# Patient Record
Sex: Male | Born: 1935 | Race: White | Hispanic: No | Smoking: Former smoker
Health system: Southern US, Community
[De-identification: ages and names within clinical notes are randomized; demographics above are authoritative.]

## PROBLEM LIST (undated history)

## (undated) DIAGNOSIS — T7840XA Allergy, unspecified, initial encounter: Secondary | ICD-10-CM

## (undated) DIAGNOSIS — J9610 Chronic respiratory failure, unspecified whether with hypoxia or hypercapnia: Secondary | ICD-10-CM

## (undated) DIAGNOSIS — N183 Chronic kidney disease, stage 3 (moderate): Secondary | ICD-10-CM

## (undated) DIAGNOSIS — E785 Hyperlipidemia, unspecified: Secondary | ICD-10-CM

## (undated) DIAGNOSIS — R001 Bradycardia, unspecified: Secondary | ICD-10-CM

## (undated) DIAGNOSIS — C159 Malignant neoplasm of esophagus, unspecified: Secondary | ICD-10-CM

## (undated) DIAGNOSIS — Z9289 Personal history of other medical treatment: Secondary | ICD-10-CM

## (undated) DIAGNOSIS — A498 Other bacterial infections of unspecified site: Secondary | ICD-10-CM

## (undated) DIAGNOSIS — I639 Cerebral infarction, unspecified: Secondary | ICD-10-CM

## (undated) DIAGNOSIS — I10 Essential (primary) hypertension: Secondary | ICD-10-CM

## (undated) DIAGNOSIS — T884XXA Failed or difficult intubation, initial encounter: Secondary | ICD-10-CM

## (undated) DIAGNOSIS — I5032 Chronic diastolic (congestive) heart failure: Secondary | ICD-10-CM

## (undated) DIAGNOSIS — K922 Gastrointestinal hemorrhage, unspecified: Secondary | ICD-10-CM

## (undated) DIAGNOSIS — G629 Polyneuropathy, unspecified: Secondary | ICD-10-CM

## (undated) DIAGNOSIS — K219 Gastro-esophageal reflux disease without esophagitis: Secondary | ICD-10-CM

## (undated) DIAGNOSIS — I2510 Atherosclerotic heart disease of native coronary artery without angina pectoris: Secondary | ICD-10-CM

## (undated) DIAGNOSIS — Z9981 Dependence on supplemental oxygen: Secondary | ICD-10-CM

## (undated) DIAGNOSIS — Z1629 Resistance to other single specified antibiotic: Secondary | ICD-10-CM

## (undated) DIAGNOSIS — M549 Dorsalgia, unspecified: Secondary | ICD-10-CM

## (undated) DIAGNOSIS — I4819 Other persistent atrial fibrillation: Secondary | ICD-10-CM

## (undated) HISTORY — DX: Gastro-esophageal reflux disease without esophagitis: K21.9

## (undated) HISTORY — DX: Allergy, unspecified, initial encounter: T78.40XA

## (undated) HISTORY — DX: Polyneuropathy, unspecified: G62.9

## (undated) HISTORY — PX: TONSILLECTOMY: SUR1361

## (undated) HISTORY — DX: Chronic diastolic (congestive) heart failure: I50.32

## (undated) HISTORY — DX: Chronic kidney disease, stage 3 (moderate): N18.3

## (undated) HISTORY — DX: Personal history of other medical treatment: Z92.89

## (undated) HISTORY — DX: Hyperlipidemia, unspecified: E78.5

## (undated) HISTORY — DX: Gastrointestinal hemorrhage, unspecified: K92.2

## (undated) HISTORY — DX: Other persistent atrial fibrillation: I48.19

## (undated) HISTORY — PX: COLONOSCOPY: SHX174

## (undated) HISTORY — DX: Essential (primary) hypertension: I10

## (undated) HISTORY — DX: Dorsalgia, unspecified: M54.9

---

## 1986-01-09 HISTORY — PX: CORONARY ANGIOPLASTY WITH STENT PLACEMENT: SHX49

## 2000-09-05 ENCOUNTER — Encounter (INDEPENDENT_AMBULATORY_CARE_PROVIDER_SITE_OTHER): Payer: Self-pay | Admitting: *Deleted

## 2000-09-05 ENCOUNTER — Other Ambulatory Visit: Admission: RE | Admit: 2000-09-05 | Discharge: 2000-09-05 | Payer: Self-pay | Admitting: Gastroenterology

## 2002-01-14 ENCOUNTER — Inpatient Hospital Stay (HOSPITAL_COMMUNITY): Admission: EM | Admit: 2002-01-14 | Discharge: 2002-01-17 | Payer: Self-pay | Admitting: Emergency Medicine

## 2002-01-15 ENCOUNTER — Encounter: Payer: Self-pay | Admitting: Cardiology

## 2002-01-15 ENCOUNTER — Encounter: Payer: Self-pay | Admitting: Internal Medicine

## 2002-01-17 ENCOUNTER — Encounter: Payer: Self-pay | Admitting: Internal Medicine

## 2003-04-10 ENCOUNTER — Encounter: Admission: RE | Admit: 2003-04-10 | Discharge: 2003-04-10 | Payer: Self-pay | Admitting: Internal Medicine

## 2006-01-09 HISTORY — PX: CATARACT EXTRACTION W/ INTRAOCULAR LENS  IMPLANT, BILATERAL: SHX1307

## 2008-10-26 ENCOUNTER — Ambulatory Visit: Payer: Self-pay | Admitting: Radiology

## 2008-10-26 ENCOUNTER — Emergency Department (HOSPITAL_BASED_OUTPATIENT_CLINIC_OR_DEPARTMENT_OTHER): Admission: EM | Admit: 2008-10-26 | Discharge: 2008-10-26 | Payer: Self-pay | Admitting: Emergency Medicine

## 2010-04-14 LAB — POCT CARDIAC MARKERS: Troponin i, poc: <0.05 ng/mL (ref 0.00–0.09)

## 2010-04-14 LAB — COMPREHENSIVE METABOLIC PANEL
ALT: 24 U/L (ref 0–53)
AST: 41 U/L — ABNORMAL HIGH (ref 0–37)
Albumin: 4.2 g/dL (ref 3.5–5.2)
Calcium: 9.8 mg/dL (ref 8.4–10.5)
GFR calc Af Amer: 48 mL/min — ABNORMAL LOW (ref >60)
Potassium: 4.5 mEq/L (ref 3.5–5.1)
Sodium: 142 mEq/L (ref 135–145)
Total Protein: 7.4 g/dL (ref 6.0–8.3)

## 2010-04-14 LAB — URINALYSIS, ROUTINE W REFLEX MICROSCOPIC
Hgb urine dipstick: NEGATIVE
Nitrite: NEGATIVE
Specific Gravity, Urine: 1.021 (ref 1.005–1.030)
Urobilinogen, UA: 0.2 mg/dL (ref 0.0–1.0)
pH: 6 (ref 5.0–8.0)

## 2010-04-14 LAB — CBC
MCHC: 35.1 g/dL (ref 30.0–36.0)
Platelets: 302 10*3/uL (ref 150–400)
RDW: 12.9 % (ref 11.5–15.5)

## 2010-04-14 LAB — DIFFERENTIAL
Eosinophils Absolute: 0.6 10*3/uL (ref 0.0–0.7)
Eosinophils Relative: 6 % — ABNORMAL HIGH (ref 0–5)
Lymphs Abs: 1.6 10*3/uL (ref 0.7–4.0)
Monocytes Absolute: 1 10*3/uL (ref 0.1–1.0)
Monocytes Relative: 10 % (ref 3–12)
Neutrophils Relative %: 68 % (ref 43–77)

## 2010-04-14 LAB — POCT B-TYPE NATRIURETIC PEPTIDE (BNP): B Natriuretic Peptide, POC: 24.8 pg/mL (ref 0–100)

## 2010-04-14 LAB — PROTIME-INR: Prothrombin Time: 13.1 seconds (ref 11.6–15.2)

## 2010-05-27 NOTE — Consult Note (Signed)
NAME:  Mark Rivers, Mark Rivers NO.:  [id number]   MEDICAL RECORD NO.:  [id number]                   PATIENT TYPE:  INP   LOCATION:  3739                                 FACILITY:  [facility]   PHYSICIAN:  Jonelle Sidle, M.D. [facility]        DATE OF BIRTH:  1934-02-14   DATE OF CONSULTATION:  01/15/2002  DATE OF DISCHARGE:                                   CONSULTATION   PRIMARY CARE PHYSICIAN:  The patient is followed by [facility] in  [place].   REASON FOR CONSULTATION:  Abnormal cardiac enzymes.   HISTORY OF PRESENT ILLNESS:  The patient is a pleasant 76 year old male with  a history of hypertension, dyslipidemia, and apparent coronary artery  disease, status post stent placement to a single vessel approximately 7-8  years ago, while living in [place].  This was not in the setting of  myocardial infarction and followed an abnormal stress test, given symptoms  of dyspnea, exertion, and chest discomfort.  He is now admitted following an  episode of syncope.  The patient states that he was having some focal tooth  discomfort and took an old amoxicillin tablet that he had had for over a  year, which he had used in the past for apparent tooth infection.  When  using amoxicillin previously, he did notice some itching and generalized  rash.  Approximately 30 minutes after ingesting this pill with his other  medicines, he described an itchy sensation all over followed by erythema,  progressing from head to toe and subsequent clamminess and diaphoresis.  He  sought the help of a neighbor and ultimately apparently had frank syncopal  event of unknown duration.  He awoke from this event on the floor and sat  back on the bed, waiting for EMS to arrive.  When he was evaluated in the  emergency department, he was treated with epinephrine and Benadryl for a  presumptive anaphylactic like reaction.  At present he feels back to  baseline.  He did note significant  dyspnea at the time of the event.  As  part of his evaluation in house, he had cardiac enzymes obtained, which  revealed troponin I levels of 0.04 up to 0.10, up to 0.18 with overall  normal relative indices on assessment of CK and MB levels.  He denies any  recent chest discomfort, specifically stating that he walks his dogs a few  times a day without any difficulty.  He does housework and yard work which  includes heavy lifting without difficulty and has no typical dyspnea  exertion without any PND, palpitations, or usual syncope.  He apparently was  evaluated by one of the [place] cardiologists which he cannot seem to  remember at this time about four months ago (potentially Dr. Eden Emms).  He  reports at that time having an echocardiogram and a Cardiolite, which by his  account were normal.  We were asked to evaluate further.  ALLERGIES:  PENICILLIN.   MEDICATIONS PRIOR TO ADMISSION:  1. Zocor 40 mg p.o. q.h.s.  2. Tricor 160 mg p.o. daily.  3. Cardizem CD 120 mg p.o. daily.  4. Lisinopril 20 mg p.o. t.i.d.  5. Calcium supplements.  6. Garlic.   CURRENT MEDICATIONS:  Lovenox 40 mg subcu daily.   PAST MEDICAL HISTORY:  1. Hypertension.  2. Dyslipidemia.  3. Apparent history of coronary artery disease without known myocardial     infarction, status post stent placement to a single vessel 7-8 years ago     while living in [place].  4. Reportedly recent echocardiogram and Cardiolite at the [facility] office were     negative about four months ago.  Records are pending.   SOCIAL HISTORY:  The patient is a retired Recruitment consultant.  He lives in  [place] with his girlfriend.  He has a remote history of tobacco and  alcohol use.  He has four children.   FAMILY HISTORY:  Noncontributory at present.   REVIEW OF SYSTEMS:  As described in history of present illness, otherwise  negative.   PHYSICAL EXAMINATION:  VITAL SIGNS:  Temperature is 97.1 degrees, blood  pressure is 142/72,  heart rate is 68 and regular, respirations 20, oxygen  saturation 94% on room air.  GENERAL:  This is an obese male, seems in no acute distress.  HEENT:  Conjunctivae and nose are normal.  Oropharynx is clear.  NECK:  Supple without elevated jugulovenous pressure or carotid bruits.  No  thyromegaly is noted.  LUNGS:  Clear to auscultation bilaterally with normal  __________  at rest.  CARDIAC:  Reveals a regular rate and rhythm without gallop or murmur.  ABDOMEN:  Has no obvious organomegaly or bruits.  EXTREMITIES:  Exhibit no clubbing, cyanosis, or edema.  Peripheral pulses  are 2+.   LABORATORY DATA:  A 12 lead electrocardiogram shows normal sinus rhythm with  decreased anterior R-wave progression without frank loss of R-waves,  otherwise nonspecific Q-wave changes are noted.   CT scan of the head is interpreted as normal.   Chest x-ray reported shows mild basilar linear atelectasis.  No clear  infiltrate.   Carotid Dopplers were obtained, revealing no significant flow limiting  internal carotid artery stenoses with antegrade vertebral flow.   Telemetry has been without significant arrhythmia.   IMPRESSION:  1. Recent episode suggestive potentially of an anaphylactic or anaphylactoid     reaction to amoxicillin, including significant pruritus, erythema,     dizziness with subsequent syncope, dyspnea, and clamminess.  The patient     has had no recent chest discomfort or dyspnea on exertion and maintains a     relatively normal functional status based on his description.  Cardiac     enzymes are mildly abnormal as outlined.  A 12 lead electrocardiogram is     nonspecific.  The patient apparently had a fairly recent ischemic workup     about four months ago that was reassuring by report.  Records are pending     currently.  He may have seen Dr. Eden Emms at this time, although this is     not clear. 2. Known history of coronary artery disease, status post stent placement to     a  single vessel, approximately 7-8 years ago in [place] without     history of myocardial infarction and presumably normal left ventricular     contraction.  3. Dyslipidemia.  4. Hypertension.    RECOMMENDATIONS:  1.  Will obtain records of Cardiolite and echocardiogram performed in the     [facility] office about four months ago and any particular clinic visit     notes from that time.  The patient may have seen Dr. Eden Emms at that time     as best he can recall.  2. Minor troponin I elevations are of uncertain significance at this time.     Certainly the possible anaphylactic/anaphylactoid event could have due to     the significant amount of stress associated with it, led to ischemia in     the setting of known coronary artery disease leading to minor troponin     leak.  The patient does not have classic exertional angina or dyspnea on     exertion at this time to suggest a major flow limiting stenosis at     baseline.  Certainly one could proceed with coronary angiography for a     definitive diagnosis as a first step.  He has had a 2-D echocardiogram     today, results are currently pending.  If this study is abnormal, would     proceed with coronary angiography as a first step.  Could otherwise     actually consider repeat Cardiolite and if reassuring, continue medical     therapy, particularly as the patient has had no typical anginal or     dyspnea on exertion symptoms recently.  Will defer to the patient's     primary cardiologist.  3. Otherwise continue current medications and telemetry.                                                  Jonelle Sidle, M.D. [facility]    SGM/MEDQ  D:  01/15/2002  T:  01/15/2002  Job:  [id number]

## 2010-05-27 NOTE — Discharge Summary (Signed)
NAME:  Mark Rivers, Mark Rivers                       ACCOUNT NO.:  [id number]   MEDICAL RECORD NO.:  [id number]                   PATIENT TYPE:  INP   LOCATION:  3739                                 FACILITY:  [facility]   PHYSICIAN:  Titus Dubin. Alwyn Ren, M.D. [facility]         DATE OF BIRTH:  04-Nov-1934   DATE OF ADMISSION:  01/14/2002  DATE OF DISCHARGE:  01/17/2002                                 DISCHARGE SUMMARY   ADMITTING IMPRESSION:  1. Possible anaphylactoid reaction to amoxicillin with pruritus, erythema,     dizziness, subsequent syncope, dyspnea, and diaphoresis.  2. Coronary artery disease, status post stent placement to single vessel     approximately eight years ago in [place].  3. Dyslipidemia.  4. Hypertension.   DISCHARGE DIAGNOSES:  1. Possible anaphylactoid reaction to amoxicillin with pruritus, erythema,     dizziness, subsequent syncope, dyspnea, and diaphoresis.  2. Coronary artery disease, status post stent placement to single vessel     approximately eight years ago in [place].  3. Dyslipidemia.  4. Hypertension.  5. New diagnosis of adult onset diabetes mellitus.   HISTORY OF PRESENT ILLNESS:  Please see the dictated history.  The patient  said that he was having some local tooth discomfort.  He had taken old  amoxicillin that was outdated.  Previously he had noted some itching and  generalized rash with amoxicillin.  Thirty minutes after ingesting the pill,  he noted pruritus followed by erythema progressing from the head to toes  with diaphoresis.  Apparently he had frank syncope of unknown duration.  He  received epinephrine and Benadryl for presumptive anaphylaxis.  The acute  event was associated with acute dyspnea.   HOSPITAL COURSE:  He was admitted and monitored.  He was seen in  consultation by Jonelle Sidle, M.D., of [facility].   His troponin was 0.18 with normal less than 0.04.  The white count was  elevated at 15,900.  The eosinophil  count was 0.  The CPK was initially 185  with a 4.6 MB.  The initial troponin was 0.04.  The subsequent CPK was 163  with an MB of 5.8.  On January 15, 2002, his total CPK was 208 with an MB of  6.2.  On January 15, 2002, a troponin was 0.04.   The electrolytes were normal, except for a BUN of 37 and a glucose of 223.  The hemoglobin A1C was 7.3, indicating adult onset diabetes mellitus.  Lipids revealed a total cholesterol of 174 with triglycerides of 152 and an  HDL of 46.  He stated that his abdominal measurement was less than 40,  although clinically it appeared larger, and direct measurement was not made.   Radiographic studies included a negative noncontrast CT of the head.  The CT  scan showed no pulmonary embolic disease.  The chest x-ray showed mild  cardiomegaly with minimal basilar atelectasis.  The EKG suggested possible  anterior infarct of possible age manifested by poor R-wave progression of V1-  V3.   He underwent an adenosine stress test on January 17, 2002.  It was negative  clinically without significant ST-T changes from baseline.  He did have  occasional PVCs.  The images revealed scar, but no ischemic process.   DISCHARGE MEDICATIONS:  He was discharged on his home medicines of:  1. Zocor 40 mg at bedtime.  2. TriCor 160 mg each morning.  3. Cardizem CD 120 mg daily.  4. Lisinopril 60 mg daily.  5. Calcium supplements prior to admission.  These were discontinued.  It was     also recommended that he take Amaryl 2 mg one-half pill each morning and     Avandia 8 mg one-half pill with the largest meal.   SPECIAL INSTRUCTIONS:  He was to receive a Glucommander and be instructed in  monitoring glucose.  He was asked to check his fasting blood sugar on  Monday, Wednesday, Friday, and Sunday and have a goal of less than 120 on  average.  He was to check his two-hour postprandial glucose on Tuesday,  Thursday, and Saturday; the goal will be an average of less than 180.   He  was asked to measure his waist at the top of the hips across the umbilicus;  the goal will be a measurement less than 40 inches.   FOLLOW-UP:  He was to be seen in follow-up by me or Wanda Plump, M.D., at  [facility] in the next two weeks.  He was to see Charlton Haws, M.D.,  in four to six weeks and was asked to call [phone] for an appointment.   DISCHARGE STATUS:  Improved.   PROGNOSIS:  Good if there is control of the metabolic syndrome risk factors.                                               Titus Dubin. Alwyn Ren, M.D. [facility]    [facility]H/MEDQ  D:  01/17/2002  T:  01/18/2002  Job:  [id number]   cc:   Charlton Haws, M.D. [facility]

## 2010-08-26 ENCOUNTER — Encounter: Payer: Self-pay | Admitting: Gastroenterology

## 2011-07-11 ENCOUNTER — Encounter: Payer: Self-pay | Admitting: Gastroenterology

## 2011-09-05 ENCOUNTER — Ambulatory Visit (AMBULATORY_SURGERY_CENTER): Payer: Medicare Other

## 2011-09-05 VITALS — Ht 70.0 in | Wt 227.1 lb

## 2011-09-05 DIAGNOSIS — Z1211 Encounter for screening for malignant neoplasm of colon: Secondary | ICD-10-CM

## 2011-09-05 MED ORDER — MOVIPREP 100 G PO SOLR: ORAL | Status: DC

## 2011-09-12 ENCOUNTER — Encounter: Payer: Self-pay | Admitting: Gastroenterology

## 2011-09-12 ENCOUNTER — Ambulatory Visit (AMBULATORY_SURGERY_CENTER): Payer: Medicare Other | Admitting: Gastroenterology

## 2011-09-12 VITALS — BP 134/59 | HR 61 | Temp 98.6°F | Resp 16 | Ht 70.0 in | Wt 227.0 lb

## 2011-09-12 DIAGNOSIS — Z1211 Encounter for screening for malignant neoplasm of colon: Secondary | ICD-10-CM

## 2011-09-12 LAB — GLUCOSE, CAPILLARY: Glucose-Capillary: 132 mg/dL — ABNORMAL HIGH (ref 70–99)

## 2011-09-12 MED ORDER — SODIUM CHLORIDE 0.9 % IV SOLN: 500.0000 mL | INTRAVENOUS | Status: DC

## 2011-09-12 NOTE — Progress Notes (Signed)
Patient did not experience any of the following events: a burn prior to discharge; a fall within the facility; wrong site/side/patient/procedure/implant event; or a hospital transfer or hospital admission upon discharge from the facility. (G8907) Patient did not have preoperative order for IV antibiotic SSI prophylaxis. (G8918)  

## 2011-09-12 NOTE — Patient Instructions (Signed)
YOU HAD AN ENDOSCOPIC PROCEDURE TODAY AT THE [facility]: Refer to the procedure report that was given to you for any specific questions about what was found during the examination.  If the procedure report does not answer your questions, please call your gastroenterologist to clarify.  If you requested that your care partner not be given the details of your procedure findings, then the procedure report has been included in a sealed envelope for you to review at your convenience later.  YOU SHOULD EXPECT: Some feelings of bloating in the abdomen. Passage of more gas than usual.  Walking can help get rid of the air that was put into your GI tract during the procedure and reduce the bloating. If you had a lower endoscopy (such as a colonoscopy or flexible sigmoidoscopy) you may notice spotting of blood in your stool or on the toilet paper. If you underwent a bowel prep for your procedure, then you may not have a normal bowel movement for a few days.  DIET: Your first meal following the procedure should be a light meal and then it is ok to progress to your normal diet.  A half-sandwich or bowl of soup is an example of a good first meal.  Heavy or fried foods are harder to digest and may make you feel nauseous or bloated.  Likewise meals heavy in dairy and vegetables can cause extra gas to form and this can also increase the bloating.  Drink plenty of fluids but you should avoid alcoholic beverages for 24 hours.  ACTIVITY: Your care partner should take you home directly after the procedure.  You should plan to take it easy, moving slowly for the rest of the day.  You can resume normal activity the day after the procedure however you should NOT DRIVE or use heavy machinery for 24 hours (because of the sedation medicines used during the test).    SYMPTOMS TO REPORT IMMEDIATELY: A gastroenterologist can be reached at any hour.  During normal business hours, 8:30 AM to 5:00 PM Monday through Friday,  call [phone].  After hours and on weekends, please call the GI answering service at [phone] who will take a message and have the physician on call contact you.   Following lower endoscopy (colonoscopy or flexible sigmoidoscopy):  Excessive amounts of blood in the stool  Significant tenderness or worsening of abdominal pains  Swelling of the abdomen that is new, acute  Fever of 100F or higher    FOLLOW UP: If any biopsies were taken you will be contacted by phone or by letter within the next 1-3 weeks.  Call your gastroenterologist if you have not heard about the biopsies in 3 weeks.  Our staff will call the home number listed on your records the next business day following your procedure to check on you and address any questions or concerns that you may have at that time regarding the information given to you following your procedure. This is a courtesy call and so if there is no answer at the home number and we have not heard from you through the emergency physician on call, we will assume that you have returned to your regular daily activities without incident.  SIGNATURES/CONFIDENTIALITY: You and/or your care partner have signed paperwork which will be entered into your electronic medical record.  These signatures attest to the fact that that the information above on your After Visit Summary has been reviewed and is understood.  Full responsibility of the confidentiality   of this discharge information lies with you and/or your care-partner.    INFORMATION ON DIVERTICULOSIS AND HIGH FIBER DIET & HEMORRHOIDS  GIVEN TO YOU TODAY

## 2011-09-12 NOTE — Op Note (Signed)
[facility] [address] [place], [address]   COLONOSCOPY PROCEDURE REPORT  PATIENT: Mark Rivers, Mark Rivers  MR#: [id number] BIRTHDATE: [birth year] , 77  yrs. old GENDER: Male ENDOSCOPIST: Meryl Dare, MD, [facility]  PROCEDURE DATE:  09/12/2011 PROCEDURE:   Colonoscopy, screening ASA CLASS:   Class II INDICATIONS: routine risk screening MEDICATIONS: propofol (Diprivan) 120mg  IV DESCRIPTION OF PROCEDURE:   After the risks benefits and alternatives of the procedure were thoroughly explained, informed consent was obtained.  A digital rectal exam revealed no abnormalities of the rectum.   The LB CF-[id number]  endoscope was introduced through the anus and advanced to the cecum, which was identified by both the appendix and ileocecal valve. No adverse events experienced.   The quality of the prep was good, using MoviPrep  The instrument was then slowly withdrawn as the colon was fully examined.    COLON FINDINGS: Mild diverticulosis was noted in the sigmoid colon. The colon was otherwise normal.  There was no diverticulosis, inflamation, polyps or cancers unless previously stated. Retroflexed views revealed small internal hemorrhoids. The time to cecum=3 minutes 40 seconds.  Withdrawal time=9 minutes 02 seconds. The scope was withdrawn and the procedure completed. COMPLICATIONS: There were no complications.  ENDOSCOPIC IMPRESSION: 1.   Mild diverticulosis was noted in the sigmoid colon 2.   Small internal hemorrhoids  RECOMMENDATIONS: 1.  High fiber diet with liberal fluid intake. 2.  Given your age, you will not need another colonoscopy for colon cancer screening or polyp surveillance.  These types of tests usually stop around the age 85.   eSigned:  Meryl Dare, MD, [facility] 09/12/2011 10:34 AM   cc: Bernadette Hoit, MD

## 2011-09-13 ENCOUNTER — Telehealth: Payer: Self-pay | Admitting: *Deleted

## 2011-09-13 NOTE — Telephone Encounter (Signed)
  Follow up Call-  Call back number 09/12/2011  Post procedure Call Back phone  # [phone]  Permission to leave phone message Yes     Patient questions:  Do you have a fever, pain , or abdominal swelling? no Pain Score  0 *  Have you tolerated food without any problems? yes  Have you been able to return to your normal activities? yes  Do you have any questions about your discharge instructions: Diet   no Medications  no Follow up visit  no  Do you have questions or concerns about your Care? no  Actions: * If pain score is 4 or above: No action needed, pain <4.

## 2012-07-11 ENCOUNTER — Encounter: Payer: Medicare Other | Admitting: Cardiovascular Disease

## 2012-08-08 ENCOUNTER — Encounter: Payer: Self-pay | Admitting: Cardiovascular Disease

## 2012-08-22 ENCOUNTER — Other Ambulatory Visit: Payer: Self-pay | Admitting: Family Medicine

## 2012-08-22 DIAGNOSIS — M549 Dorsalgia, unspecified: Secondary | ICD-10-CM

## 2012-08-28 ENCOUNTER — Other Ambulatory Visit: Payer: Medicare Other

## 2013-02-21 ENCOUNTER — Encounter: Payer: Self-pay | Admitting: Cardiovascular Disease

## 2013-02-21 DIAGNOSIS — M549 Dorsalgia, unspecified: Secondary | ICD-10-CM | POA: Insufficient documentation

## 2013-02-21 DIAGNOSIS — K219 Gastro-esophageal reflux disease without esophagitis: Secondary | ICD-10-CM | POA: Insufficient documentation

## 2013-02-21 DIAGNOSIS — I129 Hypertensive chronic kidney disease with stage 1 through stage 4 chronic kidney disease, or unspecified chronic kidney disease: Secondary | ICD-10-CM | POA: Insufficient documentation

## 2013-02-21 DIAGNOSIS — T7840XA Allergy, unspecified, initial encounter: Secondary | ICD-10-CM | POA: Insufficient documentation

## 2013-02-21 DIAGNOSIS — G629 Polyneuropathy, unspecified: Secondary | ICD-10-CM | POA: Insufficient documentation

## 2013-02-21 DIAGNOSIS — N183 Chronic kidney disease, stage 3 (moderate): Secondary | ICD-10-CM

## 2013-02-25 ENCOUNTER — Encounter: Payer: Medicare Other | Admitting: Cardiovascular Disease

## 2013-03-12 ENCOUNTER — Ambulatory Visit (INDEPENDENT_AMBULATORY_CARE_PROVIDER_SITE_OTHER): Payer: Medicare Other | Admitting: Cardiovascular Disease

## 2013-03-12 ENCOUNTER — Encounter: Payer: Self-pay | Admitting: Cardiovascular Disease

## 2013-03-12 VITALS — BP 184/64 | HR 44 | Ht 68.0 in | Wt 229.0 lb

## 2013-03-12 DIAGNOSIS — R0609 Other forms of dyspnea: Secondary | ICD-10-CM

## 2013-03-12 DIAGNOSIS — R0600 Dyspnea, unspecified: Secondary | ICD-10-CM

## 2013-03-12 DIAGNOSIS — R0989 Other specified symptoms and signs involving the circulatory and respiratory systems: Secondary | ICD-10-CM

## 2013-03-12 DIAGNOSIS — I10 Essential (primary) hypertension: Secondary | ICD-10-CM

## 2013-03-12 DIAGNOSIS — I2510 Atherosclerotic heart disease of native coronary artery without angina pectoris: Secondary | ICD-10-CM

## 2013-03-12 DIAGNOSIS — M549 Dorsalgia, unspecified: Secondary | ICD-10-CM

## 2013-03-12 DIAGNOSIS — Z77090 Contact with and (suspected) exposure to asbestos: Secondary | ICD-10-CM

## 2013-03-12 DIAGNOSIS — R0602 Shortness of breath: Secondary | ICD-10-CM | POA: Insufficient documentation

## 2013-03-12 MED ORDER — HYDROCHLOROTHIAZIDE 12.5 MG PO CAPS: 12.5000 mg | ORAL_CAPSULE | Freq: Every day | ORAL | Status: DC

## 2013-03-12 MED ORDER — LISINOPRIL 20 MG PO TABS: 20.0000 mg | ORAL_TABLET | Freq: Every day | ORAL | Status: DC

## 2013-03-12 NOTE — Assessment & Plan Note (Signed)
Stable with no angina and good activity level.  Continue medical Rx Myovue 6/14 normal

## 2013-03-12 NOTE — Progress Notes (Signed)
Patient ID: Mark Rivers, male   DOB: October 18, 1934, 79 y.o.   MRN: [id number]    79 yo obese male self referred for f/u of CAD  He had angioplasty in [place] late 1980s had not been cathed since.  He had SSCP at that time.  BP has been high  Lisinopril recently increased.  May need back surgery.  History of smoking and asbestos exposure with exertional dyspnea   Obese  Tried liquid diet last year burt got diarrhea.   Reviewed records From Dr Lynnea Ferrier [facility].  Echo with LVH and diastolic relaxation abnormality Normal EF no significant valve disease.  Myovue with no ischemia and normal EF  He denies SSCP just dyspnea.  Quit smoking and drinking years ago.  Has had temporary tens unit in back and needs f/u for permanent surgery  Compliant with meds Takes BP at home and been running high    ROS: Denies fever, malais, weight loss, blurry vision, decreased visual acuity, cough, sputum, SOB, hemoptysis, pleuritic pain, palpitaitons, heartburn, abdominal pain, melena, lower extremity edema, claudication, or rash.  All other systems reviewed and negative   General: Affect appropriate Obese white male  HEENT: normal Neck supple with no adenopathy JVP normal no bruits no thyromegaly Lungs clear with no wheezing and good diaphragmatic motion Heart:  S1/S2 no murmur,rub, gallop or click PMI normal Abdomen: benighn, BS positve, no tenderness, no AAA no bruit.  No HSM or HJR Distal pulses intact with no bruits No edema Neuro non-focal Skin warm and dry No muscular weakness  Medications Current Outpatient Prescriptions  Medication Sig Dispense Refill  . carvedilol (COREG) 6.25 MG tablet Take 6.25 mg by mouth daily.      . cetirizine (ZYRTEC) 10 MG tablet Take 10 mg by mouth 2 (two) times daily.      . fenofibrate 54 MG tablet Take 54 mg by mouth daily.      . Ibuprofen (ADVIL PO) Take by mouth 2 (two) times daily.      . insulin glargine (LANTUS) 100 UNIT/ML injection Inject into the skin as  needed.      [name] lisinopril (PRINIVIL,ZESTRIL) 10 MG tablet Take 10 mg by mouth daily.      . niacin (NIASPAN) 1000 MG CR tablet Take 1,000 mg by mouth at bedtime.      [name] omeprazole (PRILOSEC) 40 MG capsule Take 40 mg by mouth every other day.       . pregabalin (LYRICA) 75 MG capsule Take 75 mg by mouth 2 (two) times daily.      . simvastatin (ZOCOR) 10 MG tablet Take 10 mg by mouth daily.      . traZODone (DESYREL) 50 MG tablet Take 50 mg by mouth at bedtime.       No current facility-administered medications for this visit.    Allergies Amoxicillin and Cephalexin  Family History: Family History  Problem Relation Age of Onset  . Diabetes Father   . Colon cancer Neg Hx   . Esophageal cancer Neg Hx   . Stomach cancer Neg Hx   . Rectal cancer Neg Hx     Social History: History   Social History  . Marital Status: Married    Spouse Name: N/A    Number of Children: N/A  . Years of Education: N/A   Occupational History  . Not on file.   Social History Main Topics  . Smoking status: Former Smoker    Types: Cigarettes    Quit date: 06/28/2004  .  Smokeless tobacco: Not on file  . Alcohol Use: No  . Drug Use: No  . Sexual Activity: Not on file   Other Topics Concern  . Not on file   Social History Narrative  . No narrative on file    Electrocardiogram:  SR PVCs otherwise normal   Assessment and Plan

## 2013-03-12 NOTE — Assessment & Plan Note (Signed)
Likely due to obesity.  However smoker with asbestosis exposure PFTls  Echo with normal EF

## 2013-03-12 NOTE — Assessment & Plan Note (Signed)
Increase lisinopril and add diuretic.  Discussed weight loss

## 2013-03-12 NOTE — Assessment & Plan Note (Signed)
Any surgery will have to wait until BP better controlled and lungs assessed.  Cardiac status appears stable

## 2013-03-12 NOTE — Patient Instructions (Signed)
Your physician recommends that you schedule a follow-up appointment in: NEXT AVAILABLE  WITH  DR [facility] Your physician has recommended you make the following change in your medication:  START  HCTZ  12.5 MG  1  EVERY DAY  INCREASE LISINOPRIL TO 20 MG  EVERY DAY  Your physician has recommended that you have a pulmonary function test. Pulmonary Function Tests are a group of tests that measure how well air moves in and out of your lungs.

## 2013-04-11 ENCOUNTER — Telehealth: Payer: Self-pay | Admitting: Cardiology

## 2013-04-11 ENCOUNTER — Encounter (HOSPITAL_COMMUNITY): Payer: Medicare Other

## 2013-04-11 NOTE — Telephone Encounter (Signed)
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 Fowarded to Dr. Kyla Balzarine nurse

## 2013-04-11 NOTE — Telephone Encounter (Signed)
New message    Patient was schedule for PFT today @ the office was close with no sign on the door. Patient is asking not to be mark as NOS for this visit.

## 2013-04-11 NOTE — Telephone Encounter (Signed)
Will forward to [employer]. CMA with Dr Marlou Porch

## 2013-04-14 NOTE — Telephone Encounter (Signed)
TEST  WAS RESCHEDULED   AND  NOS   DELETED .Mark Rivers

## 2013-04-16 ENCOUNTER — Ambulatory Visit (INDEPENDENT_AMBULATORY_CARE_PROVIDER_SITE_OTHER): Payer: Medicare Other | Admitting: Internal Medicine

## 2013-04-16 DIAGNOSIS — R0600 Dyspnea, unspecified: Secondary | ICD-10-CM

## 2013-04-16 DIAGNOSIS — Z77090 Contact with and (suspected) exposure to asbestos: Secondary | ICD-10-CM

## 2013-04-16 DIAGNOSIS — R0989 Other specified symptoms and signs involving the circulatory and respiratory systems: Secondary | ICD-10-CM

## 2013-04-16 DIAGNOSIS — R0609 Other forms of dyspnea: Secondary | ICD-10-CM

## 2013-04-16 LAB — PULMONARY FUNCTION TEST
DL/VA % pred: 73 %
DL/VA: 3.2 ml/min/mmHg/L
DLCO UNC % PRED: 63 %
DLCO unc: 17.44 ml/min/mmHg
FEF 25-75 POST: 1.84 L/s
FEF 25-75 Pre: 0.83 L/sec
FEF2575-%CHANGE-POST: 121 %
FEF2575-%PRED-POST: 102 %
FEF2575-%Pred-Pre: 46 %
FEV1-%CHANGE-POST: 17 %
FEV1-%Pred-Post: 80 %
FEV1-%Pred-Pre: 67 %
FEV1-POST: 2.04 L
FEV1-Pre: 1.73 L
FEV1FVC-%CHANGE-POST: 11 %
FEV1FVC-%PRED-PRE: 93 %
FEV6-%Change-Post: 9 %
FEV6-%Pred-Post: 80 %
FEV6-%Pred-Pre: 74 %
FEV6-POST: 2.69 L
FEV6-Pre: 2.46 L
FEV6FVC-%CHANGE-POST: 3 %
FEV6FVC-%PRED-PRE: 103 %
FEV6FVC-%Pred-Post: 106 %
FVC-%Change-Post: 5 %
FVC-%PRED-PRE: 72 %
FVC-%Pred-Post: 76 %
FVC-PRE: 2.57 L
FVC-Post: 2.72 L
Post FEV1/FVC ratio: 75 %
Post FEV6/FVC ratio: 99 %
Pre FEV1/FVC ratio: 67 %
Pre FEV6/FVC Ratio: 96 %
RV % PRED: 82 %
RV: 1.99 L
TLC % pred: 87 %
TLC: 5.54 L

## 2013-04-16 NOTE — Progress Notes (Signed)
PFT done today. 

## 2013-04-18 ENCOUNTER — Encounter: Payer: Self-pay | Admitting: Cardiovascular Disease

## 2013-04-18 ENCOUNTER — Ambulatory Visit (INDEPENDENT_AMBULATORY_CARE_PROVIDER_SITE_OTHER): Payer: Medicare Other | Admitting: Cardiovascular Disease

## 2013-04-18 VITALS — BP 140/66 | HR 72 | Ht 68.0 in | Wt 224.0 lb

## 2013-04-18 DIAGNOSIS — I2510 Atherosclerotic heart disease of native coronary artery without angina pectoris: Secondary | ICD-10-CM

## 2013-04-18 DIAGNOSIS — J449 Chronic obstructive pulmonary disease, unspecified: Secondary | ICD-10-CM

## 2013-04-18 DIAGNOSIS — R0989 Other specified symptoms and signs involving the circulatory and respiratory systems: Secondary | ICD-10-CM

## 2013-04-18 DIAGNOSIS — R0609 Other forms of dyspnea: Secondary | ICD-10-CM

## 2013-04-18 DIAGNOSIS — R0600 Dyspnea, unspecified: Secondary | ICD-10-CM

## 2013-04-18 DIAGNOSIS — J4489 Other specified chronic obstructive pulmonary disease: Secondary | ICD-10-CM

## 2013-04-18 MED ORDER — LISINOPRIL 40 MG PO TABS: 20.0000 mg | ORAL_TABLET | Freq: Every day | ORAL | Status: DC

## 2013-04-18 NOTE — Assessment & Plan Note (Signed)
Stable with no angina and good activity level.  Continue medical Rx Ok to do liquid diet

## 2013-04-18 NOTE — Assessment & Plan Note (Signed)
EF normal Related to obesity and COPD  Refer to pulmonary Consider sleep study

## 2013-04-18 NOTE — Patient Instructions (Signed)
Your physician recommends that you schedule a follow-up appointment in: NEXT  AVAILABLE  WITH  DR [facility] Your physician has recommended you make the following change in your medication:   INCREASE LISINOPRIL  TO   40 MG  EVERY DAY  You have been referred to PULMONARY    DX  COPD   AND  ?  SLEEP STUDY

## 2013-04-18 NOTE — Assessment & Plan Note (Signed)
Continue diuretic increase lisinopril to 40 mg daily  F/U next available

## 2013-04-18 NOTE — Progress Notes (Signed)
Patient ID: Mark Rivers, male   DOB: 1934/05/10, 79 y.o.   MRN: [id number] 79 yo obese male self referred for f/u of CAD He had angioplasty in [place] late 1980s had not been cathed since. He had SSCP at that time. BP has been high Lisinopril recently increased. May need back surgery. History of smoking and asbestos exposure with exertional dyspnea Obese Tried liquid diet last year burt got diarrhea. Reviewed records  From Dr Lynnea Ferrier Cards. Echo with LVH and diastolic relaxation abnormality Normal EF no significant valve disease. Myovue with no ischemia and normal EF He denies  SSCP just dyspnea. Quit smoking and drinking years ago. Has had temporary tens unit in back and needs f/u for permanent surgery Compliant with meds Takes BP at home and been running high   Last visit ACE increased and diuretic added PFTls showed moderate COPD referred to pulmonary  Wants to do liquid diet and I think his heart is fine for this  Had diarrhea with it last time but thinks it was from too much ice   Reviewed BP records from home and they are very labile but I believe still too high    ROS: Denies fever, malais, weight loss, blurry vision, decreased visual acuity, cough, sputum, SOB, hemoptysis, pleuritic pain, palpitaitons, heartburn, abdominal pain, melena, lower extremity edema, claudication, or rash.  All other systems reviewed and negative  General: Affect appropriate Obese white male  HEENT: normal Neck supple with no adenopathy JVP normal no bruits no thyromegaly Lungs clear with no wheezing and good diaphragmatic motion Heart:  S1/S2 no murmur, no rub, gallop or click PMI normal Abdomen: benighn, BS positve, no tenderness, no AAA no bruit.  No HSM or HJR Distal pulses intact with no bruits No edema Neuro non-focal Skin warm and dry No muscular weakness   Current Outpatient Prescriptions  Medication Sig Dispense Refill  . carvedilol (COREG) 6.25 MG tablet Take 6.25 mg by mouth daily.       . cetirizine (ZYRTEC) 10 MG tablet Take 10 mg by mouth 2 (two) times daily.      . hydrochlorothiazide (MICROZIDE) 12.5 MG capsule Take 1 capsule (12.5 mg total) by mouth daily.  30 capsule  11  . Ibuprofen (ADVIL PO) Take by mouth 2 (two) times daily.      . insulin glargine (LANTUS) 100 UNIT/ML injection Inject into the skin as needed.      [name] lisinopril (PRINIVIL,ZESTRIL) 20 MG tablet Take 1 tablet (20 mg total) by mouth daily.  30 tablet  11  . niacin (NIASPAN) 1000 MG CR tablet Take 1,000 mg by mouth at bedtime.      [name] omeprazole (PRILOSEC) 40 MG capsule Take 40 mg by mouth every other day.       . pregabalin (LYRICA) 75 MG capsule Take 75 mg by mouth 2 (two) times daily.      . simvastatin (ZOCOR) 10 MG tablet Take 10 mg by mouth daily.      . traZODone (DESYREL) 50 MG tablet Take 50 mg by mouth at bedtime.       No current facility-administered medications for this visit.    Allergies  Amoxicillin and Cephalexin  Electrocardiogram:  SR PAC;s PVC;s  No acute changes   Assessment and Plan

## 2013-04-22 ENCOUNTER — Ambulatory Visit (INDEPENDENT_AMBULATORY_CARE_PROVIDER_SITE_OTHER): Payer: Medicare Other | Admitting: Pulmonary Disease

## 2013-04-22 ENCOUNTER — Encounter: Payer: Self-pay | Admitting: Pulmonary Disease

## 2013-04-22 VITALS — BP 130/84 | HR 57 | Temp 97.1°F | Ht 66.0 in | Wt 228.2 lb

## 2013-04-22 DIAGNOSIS — J438 Other emphysema: Secondary | ICD-10-CM

## 2013-04-22 DIAGNOSIS — J439 Emphysema, unspecified: Secondary | ICD-10-CM

## 2013-04-22 DIAGNOSIS — R0600 Dyspnea, unspecified: Secondary | ICD-10-CM

## 2013-04-22 DIAGNOSIS — R0989 Other specified symptoms and signs involving the circulatory and respiratory systems: Secondary | ICD-10-CM

## 2013-04-22 DIAGNOSIS — R0609 Other forms of dyspnea: Secondary | ICD-10-CM

## 2013-04-22 MED ORDER — ALBUTEROL SULFATE HFA 108 (90 BASE) MCG/ACT IN AERS: 2.0000 | INHALATION_SPRAY | Freq: Four times a day (QID) | RESPIRATORY_TRACT | Status: DC | PRN

## 2013-04-22 NOTE — Patient Instructions (Signed)
You have only mild copd, and therefore do not require daily medications. Will prescribe proair inhaler, and can use 2 puffs every 6hrs only if needed.  Always sit down first for 5 min to see if breathing improves.  If it does, you do not require the inhaler.  If it does not improve, then you can use the inhaler.  If you are using the inhaler everyday, please let us know and we can consider a longer acting medication.   Work on weight loss and conditioning. followup with me as needed.

## 2013-04-22 NOTE — Progress Notes (Signed)
   Subjective:    Patient ID: Mark Rivers, male    DOB: February 14, 1934, 79 y.o.   MRN: [id number]  HPI The patient is a 79 year old male who I've been asked to see for dyspnea. The patient states that he has had some shortness of breath over the last one year, but does not feel that his symptoms are progressive. The patient states that he can walk multiple blocks on flat ground at a moderate pace without getting winded. He does not get short of breath bringing groceries in from the car or walking up flights of stairs. He does note shortness of breath at night on occasions where he will awaken with breathing difficulties. This is not a consistent issue for him. He has an occasional dry cough, but does not produce mucus or have significant congestion. He has not had any lower extremity edema. He denies any history of asthma, but does have a history of tobacco abuse of 2 packs per day for 30 years. He has not smoked since 2006. The patient's weight is increased 8 pounds over the last one year. He has had recent pulmonary function studies that show mild airflow obstruction with a good bronchodilator response. He had no restriction, and only a mild decrease in diffusion capacity.   Review of Systems  Constitutional: Negative for fever and unexpected weight change.  HENT: Positive for sneezing. Negative for congestion, dental problem, ear pain, nosebleeds, postnasal drip, rhinorrhea, sinus pressure, sore throat and trouble swallowing.   Eyes: Negative for redness and itching.  Respiratory: Positive for shortness of breath ( at rest). Negative for cough, chest tightness and wheezing.   Cardiovascular: Positive for palpitations. Negative for leg swelling.  Gastrointestinal: Negative for nausea and vomiting.  Genitourinary: Negative for dysuria.  Musculoskeletal: Negative for joint swelling.  Skin: Negative for rash.  Neurological: Negative for headaches.  Hematological: Does not bruise/bleed easily.   Psychiatric/Behavioral: Negative for dysphoric mood. The patient is not nervous/anxious.        Objective:   Physical Exam Constitutional:  Overweight male, no acute distress  HENT:  Nares patent without discharge  Oropharynx without exudate, palate and uvula are normal  Eyes:  Perrla, eomi, no scleral icterus  Neck:  No JVD, no TMG  Cardiovascular:  Normal rate, ?irregular rhythm, no rubs or gallops.  No murmurs        Intact distal pulses but decreased.  Pulmonary :  Normal breath sounds, no stridor or respiratory distress   No rales, rhonchi, or wheezing  Abdominal:  Soft, nondistended, bowel sounds present.  No tenderness noted.   Musculoskeletal:  mild lower extremity edema noted.  Lymph Nodes:  No cervical lymphadenopathy noted  Skin:  No cyanosis noted  Neurologic:  Alert, appropriate, moves all 4 extremities without obvious deficit.         Assessment & Plan:

## 2013-04-22 NOTE — Assessment & Plan Note (Signed)
The patient has mild obstruction on his recent pulmonary function studies, with a good response to bronchodilators. The good news here is that he is not overly symptomatic, and he does not feel that his dyspnea on exertion overly impacts his quality of life. He therefore does not require a maintenance medication at this time, and we will treat him with an as needed her rescue inhaler only. If he finds that he is requiring this on a daily basis, would consider a once a day LABA.  I've also encouraged him to work aggressively on weight loss and conditioning

## 2013-05-15 ENCOUNTER — Encounter: Payer: Self-pay | Admitting: Cardiovascular Disease

## 2013-05-15 ENCOUNTER — Telehealth: Payer: Self-pay | Admitting: Cardiovascular Disease

## 2013-05-15 NOTE — Telephone Encounter (Signed)
Error//SR ° ° ° ° °

## 2013-06-27 ENCOUNTER — Ambulatory Visit: Payer: Medicare Other | Admitting: Cardiovascular Disease

## 2013-07-04 ENCOUNTER — Other Ambulatory Visit: Payer: Self-pay

## 2013-07-04 MED ORDER — CARVEDILOL 6.25 MG PO TABS: 6.2500 mg | ORAL_TABLET | Freq: Every day | ORAL | Status: DC

## 2013-07-10 ENCOUNTER — Encounter: Payer: Self-pay | Admitting: *Deleted

## 2013-07-10 ENCOUNTER — Encounter: Payer: Medicare Other | Attending: Family Medicine | Admitting: *Deleted

## 2013-07-10 VITALS — Ht 67.0 in | Wt 224.0 lb

## 2013-07-10 DIAGNOSIS — E119 Type 2 diabetes mellitus without complications: Secondary | ICD-10-CM | POA: Diagnosis present

## 2013-07-10 DIAGNOSIS — Z713 Dietary counseling and surveillance: Secondary | ICD-10-CM | POA: Insufficient documentation

## 2013-07-10 NOTE — Progress Notes (Signed)
Appt start time: 1530 end time:  1700.  Assessment:  Patient was seen on  07/10/2013 for individual diabetes education. No more grocery shopping, eat out every day. His wife isn't able to cook as much as she used to, he cooks as able. He checks his BG in the AM and he states it is usually higher (140-150), so he checks in the afternoons (70-90 mg/dl). 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 He has been in [place] for the last 15 years. 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 No hypoglycemia lately but he is aware of the symptoms.  Current HbA1c: 9.3%  Preferred Learning Style:   No preference indicated   Learning Readiness:   Ready  MEDICATIONS: see list, diabetes medication is Lantus @ 30 units at night  DIETARY INTAKE:  24-hr recall:  B ( AM): blueberries with cream,   Snk ( AM): no  L ( PM): [employer]: chicken nuggets, fries OR sit down restaurant: 9 " pizza and chocolate chip cookies with diet soda or Chic Filet: salad with fruit and balsamic dressing, unsweet iced tea with Splenda Snk ( PM): no D ( PM): grilled cheese sandwich OR left overs OR BBQ chicken with instant mashed potatoes Snk ( PM): 3-4 scoops of ice cream Beverages: unsweet tea or diet soda or skim milk  Usual physical activity: some work around the house  Estimated energy needs: 1600 calories 180 g carbohydrates 120 g protein 44 g fat  Progress Towards Goal(s):  In progress.   Nutritional Diagnosis:  NB-1.1 Food and nutrition-related knowledge deficit As related to diabetes.  As evidenced by A1c of 9.3%.    Intervention:  Nutrition counseling provided.  Discussed diabetes disease process and treatment options.  Discussed physiology of diabetes and role of obesity on insulin resistance.  Encouraged moderate weight reduction to improve glucose levels.  Discussed role of medications and diet in glucose control  Provided education on macronutrients on glucose levels.   Provided education on carb counting, importance of regularly scheduled meals/snacks, and meal planning  Discussed effects of physical activity on glucose levels and long-term glucose control.  Recommended increase in his physical activity/week.  Reviewed patient medications.  Discussed role of medication on blood glucose and possible side effects  Discussed blood glucose monitoring and interpretation.  Discussed recommended target ranges and individual ranges.    Described short-term complications: hyper- and hypo-glycemia.  Discussed causes,symptoms, and treatment options.  Discussed prevention, detection, and treatment of long-term complications.  Discussed the role of prolonged elevated glucose levels on body systems.  Discussed role of stress on blood glucose levels and discussed strategies to manage psychosocial issues.  Discussed recommendations for long-term diabetes self-care.  Provided checklist for medical, dental, and emotional self-care.  Plan:  Aim for 3 Carb Choices per meal (45 grams) +/- 1 either way  Aim for 0-1 Carbs per snack if hungry  Include protein in moderation with your meals and snacks Consider  increasing your activity level by walking or water exercises as tolerated Continue checking BG at alternate times per day as directed by MD  Consider taking medication Lantus at night as directed by MD  Teaching Method Utilized: Visual, Auditory and Hands on  Handouts given during visit include: Living Well with Diabetes Carb Counting handouts Meal Plan Card  Low Sodium Seasoning list  Barriers to learning/adherence to lifestyle change: less activity with aging  Diabetes self-care support plan:   [facility] support group  available  Demonstrated degree of understanding via:  Teach Back   Monitoring/Evaluation:  Dietary intake, exercise, SMBG, and body weight in 3 month(s).

## 2013-07-10 NOTE — Patient Instructions (Signed)
Plan:  Aim for 3 Carb Choices per meal (45 grams) +/- 1 either way  Aim for 0-1 Carbs per snack if hungry  Include protein in moderation with your meals and snacks Consider  increasing your activity level by walking or water exercises as tolerated Continue checking BG at alternate times per day as directed by MD  Consider taking medication Lantus at night as directed by MD

## 2013-07-15 ENCOUNTER — Ambulatory Visit (INDEPENDENT_AMBULATORY_CARE_PROVIDER_SITE_OTHER): Payer: Medicare Other | Admitting: Cardiovascular Disease

## 2013-07-15 ENCOUNTER — Ambulatory Visit: Payer: Medicare Other | Admitting: Cardiovascular Disease

## 2013-07-15 ENCOUNTER — Encounter: Payer: Self-pay | Admitting: Cardiovascular Disease

## 2013-07-15 VITALS — BP 177/74 | HR 54 | Ht 67.0 in | Wt 221.1 lb

## 2013-07-15 DIAGNOSIS — I10 Essential (primary) hypertension: Secondary | ICD-10-CM

## 2013-07-15 DIAGNOSIS — J439 Emphysema, unspecified: Secondary | ICD-10-CM

## 2013-07-15 DIAGNOSIS — I2510 Atherosclerotic heart disease of native coronary artery without angina pectoris: Secondary | ICD-10-CM

## 2013-07-15 DIAGNOSIS — J438 Other emphysema: Secondary | ICD-10-CM

## 2013-07-15 DIAGNOSIS — R0600 Dyspnea, unspecified: Secondary | ICD-10-CM

## 2013-07-15 DIAGNOSIS — I2584 Coronary atherosclerosis due to calcified coronary lesion: Secondary | ICD-10-CM

## 2013-07-15 DIAGNOSIS — R0989 Other specified symptoms and signs involving the circulatory and respiratory systems: Secondary | ICD-10-CM

## 2013-07-15 DIAGNOSIS — R0609 Other forms of dyspnea: Secondary | ICD-10-CM

## 2013-07-15 NOTE — Progress Notes (Signed)
Patient ID: Mark Rivers, male   DOB: 09/05/1934, 79 y.o.   MRN: [id number] 79 yo obese male self referred for f/u of CAD He had angioplasty in [place] late 1980s had not been cathed since. He had SSCP at that time. BP has been high Lisinopril recently increased. May need back surgery. History of smoking and asbestos exposure with exertional dyspnea Obese Tried liquid diet last year burt got diarrhea. Reviewed records  From Dr Lynnea Ferrier Cards. Echo with LVH and diastolic relaxation abnormality Normal EF no significant valve disease. Myovue with no ischemia and normal EF He denies  SSCP just dyspnea. Quit smoking and drinking years ago. Has had temporary tens unit in back and needs f/u for permanent surgery Compliant with meds Takes BP at home and been running high  Last visit ACE increased and diuretic added  PFTls showed moderate COPD referred to pulmonary  Wants to do liquid diet and I think his heart is fine for this Had diarrhea with it last time but thinks it was from too much ice  Reviewed BP records from home and they are very labile but I believe still too high   Lisinopril increased last visit  Not taking meds appropriately  Taking 1/2 lisinopril  And only taking coreg once  Does tend to be bradycardic with pulse underestimated by PVC    ROS: Denies fever, malais, weight loss, blurry vision, decreased visual acuity, cough, sputum, SOB, hemoptysis, pleuritic pain, palpitaitons, heartburn, abdominal pain, melena, lower extremity edema, claudication, or rash.  All other systems reviewed and negative  General: Affect appropriate Obese white male  HEENT: normal Neck supple with no adenopathy JVP normal no bruits no thyromegaly Lungs clear with no wheezing and good diaphragmatic motion Heart:  S1/S2 no murmur, no rub, gallop or click PMI normal Abdomen: benighn, BS positve, no tenderness, no AAA no bruit.  No HSM or HJR Distal pulses intact with no bruits No edema Neuro  non-focal Skin warm and dry No muscular weakness   Current Outpatient Prescriptions  Medication Sig Dispense Refill  . carvedilol (COREG) 6.25 MG tablet Take 1 tablet (6.25 mg total) by mouth daily.  30 tablet  6  . cetirizine (ZYRTEC) 10 MG tablet Take 10 mg by mouth 2 (two) times daily.      . fenofibrate 54 MG tablet Take 54 mg by mouth daily.      . hydrochlorothiazide (MICROZIDE) 12.5 MG capsule Take 1 capsule (12.5 mg total) by mouth daily.  30 capsule  11  . Ibuprofen (ADVIL PO) Take by mouth 2 (two) times daily.      . insulin glargine (LANTUS) 100 UNIT/ML injection Inject into the skin as needed.      [name] lisinopril (PRINIVIL,ZESTRIL) 40 MG tablet Take 0.5 tablets (20 mg total) by mouth daily.  30 tablet  11  . niacin (NIASPAN) 1000 MG CR tablet Take 1,000 mg by mouth at bedtime.      [name] omeprazole (PRILOSEC) 40 MG capsule Take 40 mg by mouth every other day.       . simvastatin (ZOCOR) 10 MG tablet Take 10 mg by mouth daily.      . traZODone (DESYREL) 50 MG tablet Take 50 mg by mouth at bedtime.      [name] albuterol (PROAIR HFA) 108 (90 BASE) MCG/ACT inhaler Inhale 2 puffs into the lungs every 6 (six) hours as needed for wheezing or shortness of breath.  1 Inhaler  6   No current facility-administered medications for  this visit.    Allergies  Amoxicillin and Cephalexin  Electrocardiogram:  SR rate 63 in bigeminal pattern   Assessment and Plan

## 2013-07-15 NOTE — Patient Instructions (Signed)
Your physician has recommended you make the following change in your medication: 1. Take Coreg TWICE a day One in the morning and One at [employer] 2. Take Lisinopril 40 MG 1 tablet  in PM 3. Take Niacin and Zocor at Bedtime  Let me know if you need any refills on your medications. Our number is [phone]  Your physician recommends that you schedule a follow-up appointment next available with Dr Johnsie Cancel

## 2013-07-15 NOTE — Assessment & Plan Note (Signed)
Stable with no angina and good activity level.  Continue medical Rx  

## 2013-07-15 NOTE — Assessment & Plan Note (Signed)
Stable no active wheezing  F/U pulmonary

## 2013-07-15 NOTE — Assessment & Plan Note (Signed)
Not taking meds correctly Has tolerated bid coreg in past without excessive bradycardia.  Will increase lisinopril to 40 mg and have him space out his coreg and diuretic  May need to change to norvasc or hydralazine if he gets too bradycardic or BP not controlled with this

## 2013-07-21 ENCOUNTER — Ambulatory Visit: Payer: Medicare Other | Admitting: Cardiovascular Disease

## 2013-09-10 ENCOUNTER — Encounter: Payer: Self-pay | Admitting: Cardiovascular Disease

## 2013-09-10 ENCOUNTER — Ambulatory Visit (INDEPENDENT_AMBULATORY_CARE_PROVIDER_SITE_OTHER): Payer: Medicare Other | Admitting: Cardiovascular Disease

## 2013-09-10 VITALS — BP 160/100 | HR 50 | Ht 67.0 in | Wt 217.0 lb

## 2013-09-10 DIAGNOSIS — I10 Essential (primary) hypertension: Secondary | ICD-10-CM

## 2013-09-10 DIAGNOSIS — I2510 Atherosclerotic heart disease of native coronary artery without angina pectoris: Secondary | ICD-10-CM

## 2013-09-10 MED ORDER — HYDRALAZINE HCL 50 MG PO TABS: 50.0000 mg | ORAL_TABLET | Freq: Two times a day (BID) | ORAL | Status: DC

## 2013-09-10 MED ORDER — LISINOPRIL 40 MG PO TABS
40.0000 mg | ORAL_TABLET | Freq: Every day | ORAL | Status: DC
Start: 2013-09-10 — End: 2013-10-15

## 2013-09-10 NOTE — Assessment & Plan Note (Signed)
Stable with no angina and good activity level.  Continue medical Rx  

## 2013-09-10 NOTE — Assessment & Plan Note (Signed)
Mild with BD response  Continue allergy Rx  Yearly CXR

## 2013-09-10 NOTE — Progress Notes (Signed)
Patient ID: Mark Rivers, male   DOB: [birth year], 79 y.o.   MRN: [id number] 79 yo obese male self referred for f/u of CAD He had angioplasty in [place] late 1980s had not been cathed since. He had SSCP at that time. BP has been high Lisinopril recently increased. May need back surgery. History of smoking and asbestos exposure with exertional dyspnea Obese Tried liquid diet last year burt got diarrhea. Reviewed records  From Dr Lynnea Ferrier Cards. Echo with LVH and diastolic relaxation abnormality Normal EF no significant valve disease. Myovue with no ischemia and normal EF He denies  SSCP just dyspnea. Quit smoking and drinking years ago. Has had temporary tens unit in back and needs f/u for permanent surgery Compliant with meds Takes BP at home and been running high  Last visit ACE increased and diuretic added  PFTls showed moderate COPD referred to pulmonary  Wants to do liquid diet and I think his heart is fine for this Had diarrhea with it last time but thinks it was from too much ice  Reviewed BP records from home and they are very labile but I believe still too high   Taking lisinopril 40 diuretic and coreg just once/day due to bradycardia     ROS: Denies fever, malais, weight loss, blurry vision, decreased visual acuity, cough, sputum, SOB, hemoptysis, pleuritic pain, palpitaitons, heartburn, abdominal pain, melena, lower extremity edema, claudication, or rash.  All other systems reviewed and negative  General: Affect appropriate Obese white male  HEENT: normal Neck supple with no adenopathy JVP normal no bruits no thyromegaly Lungs clear with no wheezing and good diaphragmatic motion Heart:  S1/S2 no murmur, no rub, gallop or click PMI normal Abdomen: benighn, BS positve, no tenderness, no AAA no bruit.  No HSM or HJR Distal pulses intact with no bruits No edema Neuro non-focal Skin warm and dry No muscular weakness   Current Outpatient Prescriptions  Medication Sig Dispense  Refill  . albuterol (PROAIR HFA) 108 (90 BASE) MCG/ACT inhaler Inhale 2 puffs into the lungs every 6 (six) hours as needed for wheezing or shortness of breath.  1 Inhaler  6  . carvedilol (COREG) 6.25 MG tablet Take 6.25 mg by mouth daily.       . cetirizine (ZYRTEC) 10 MG tablet Take 10 mg by mouth 2 (two) times daily.      . fenofibrate 54 MG tablet Take 54 mg by mouth daily.      . hydrochlorothiazide (MICROZIDE) 12.5 MG capsule Take 1 capsule (12.5 mg total) by mouth daily.  30 capsule  11  . Ibuprofen (ADVIL PO) Take by mouth 2 (two) times daily.      . insulin glargine (LANTUS) 100 UNIT/ML injection Inject into the skin as needed.      [name] lisinopril (PRINIVIL,ZESTRIL) 40 MG tablet Take 40 mg by mouth daily.      . niacin (NIASPAN) 1000 MG CR tablet Take 1,000 mg by mouth at bedtime.      [name] omeprazole (PRILOSEC) 40 MG capsule Take 40 mg by mouth every other day.       . simvastatin (ZOCOR) 10 MG tablet Take 10 mg by mouth daily at 6 PM.       . traZODone (DESYREL) 50 MG tablet Take 50 mg by mouth at bedtime.       No current facility-administered medications for this visit.    Allergies  Amoxicillin and Cephalexin  Electrocardiogram:  NSR LVH   Assessment and Plan

## 2013-09-10 NOTE — Patient Instructions (Signed)
Your physician recommends that you schedule a follow-up appointment in:  NEXT  AVAILABLE WITH DR [facility] Your physician has recommended you make the following change in your medication: HYDRALAZINE   50 MG  TWICE

## 2013-09-10 NOTE — Assessment & Plan Note (Signed)
Discussed lifestyle modifications  Wife will try to walk with him  Add hydralazine 50 bid  F/U next available

## 2013-09-11 DIAGNOSIS — IMO0002 Reserved for concepts with insufficient information to code with codable children: Secondary | ICD-10-CM | POA: Insufficient documentation

## 2013-09-11 DIAGNOSIS — E785 Hyperlipidemia, unspecified: Secondary | ICD-10-CM

## 2013-09-11 DIAGNOSIS — E1165 Type 2 diabetes mellitus with hyperglycemia: Secondary | ICD-10-CM | POA: Insufficient documentation

## 2013-09-11 HISTORY — DX: Hyperlipidemia, unspecified: E78.5

## 2013-10-14 ENCOUNTER — Ambulatory Visit: Payer: Medicare Other | Admitting: *Deleted

## 2013-10-15 ENCOUNTER — Encounter: Payer: Self-pay | Admitting: Cardiovascular Disease

## 2013-10-15 ENCOUNTER — Ambulatory Visit (INDEPENDENT_AMBULATORY_CARE_PROVIDER_SITE_OTHER): Payer: Medicare Other | Admitting: Cardiovascular Disease

## 2013-10-15 VITALS — BP 164/78 | HR 59 | Ht 67.0 in | Wt 218.0 lb

## 2013-10-15 DIAGNOSIS — I2510 Atherosclerotic heart disease of native coronary artery without angina pectoris: Secondary | ICD-10-CM

## 2013-10-15 DIAGNOSIS — I10 Essential (primary) hypertension: Secondary | ICD-10-CM

## 2013-10-15 MED ORDER — AMLODIPINE BESYLATE 10 MG PO TABS: 10.0000 mg | ORAL_TABLET | Freq: Every day | ORAL | Status: DC

## 2013-10-15 MED ORDER — LOSARTAN POTASSIUM 100 MG PO TABS: 100.0000 mg | ORAL_TABLET | Freq: Every day | ORAL | Status: DC

## 2013-10-15 MED ORDER — FUROSEMIDE 20 MG PO TABS: 20.0000 mg | ORAL_TABLET | Freq: Every day | ORAL | Status: DC

## 2013-10-15 NOTE — Assessment & Plan Note (Signed)
Stable with no angina and good activity level.  Continue medical Rx  

## 2013-10-15 NOTE — Assessment & Plan Note (Signed)
Dyspnea from obesity  No active wheezing  Stable

## 2013-10-15 NOTE — Progress Notes (Signed)
Patient ID: Mark Rivers, male   DOB: 11/28/1934, 79 y.o.   MRN: [id number] 79 yo obese male self referred for f/u of CAD He had angioplasty in [place] late 1980s had not been cathed since. He had SSCP at that time. BP has been high Lisinopril recently increased. May need back surgery. History of smoking and asbestos exposure with exertional dyspnea Obese Tried liquid diet last year burt got diarrhea. Reviewed records  From Dr Lynnea Ferrier Cards. Echo with LVH and diastolic relaxation abnormality Normal EF no significant valve disease. Myovue with no ischemia and normal EF He denies  SSCP just dyspnea. Quit smoking and drinking years ago. Has had temporary tens unit in back and needs f/u for permanent surgery Compliant with meds Takes BP at home and been running high  Last visit ACE increased and diuretic added  PFTls showed moderate COPD referred to pulmonary  Wants to do liquid diet and I think his heart is fine for this Had diarrhea with it last time but thinks it was from too much ice  Reviewed BP records from home and they are very labile but I believe still too high  Taking lisinopril 40 diuretic and coreg just once/day due to bradycardia  BP still running high been taking HCTZ bid by mistake     ROS: Denies fever, malais, weight loss, blurry vision, decreased visual acuity, cough, sputum, SOB, hemoptysis, pleuritic pain, palpitaitons, heartburn, abdominal pain, melena, lower extremity edema, claudication, or rash.  All other systems reviewed and negative  General: Affect appropriate Obese white male  HEENT: normal Neck supple with no adenopathy JVP normal no bruits no thyromegaly Lungs clear with no wheezing and good diaphragmatic motion Heart:  S1/S2 no murmur, no rub, gallop or click PMI normal Abdomen: benighn, BS positve, no tenderness, no AAA no bruit.  No HSM or HJR Distal pulses intact with no bruits Plus one bilateral edema Neuro non-focal Skin warm and dry No muscular  weakness   Current Outpatient Prescriptions  Medication Sig Dispense Refill  . albuterol (PROAIR HFA) 108 (90 BASE) MCG/ACT inhaler Inhale 2 puffs into the lungs every 6 (six) hours as needed for wheezing or shortness of breath.  1 Inhaler  6  . carvedilol (COREG) 6.25 MG tablet Take 6.25 mg by mouth daily.       . cetirizine (ZYRTEC) 10 MG tablet Take 10 mg by mouth 2 (two) times daily.      . fenofibrate 54 MG tablet Take 54 mg by mouth daily.      . hydrALAZINE (APRESOLINE) 50 MG tablet Take 1 tablet (50 mg total) by mouth 2 (two) times daily.  180 tablet  3  . hydrochlorothiazide (MICROZIDE) 12.5 MG capsule Take 1 capsule (12.5 mg total) by mouth daily.  30 capsule  11  . Ibuprofen (ADVIL PO) Take by mouth 2 (two) times daily.      . insulin glargine (LANTUS) 100 UNIT/ML injection Inject into the skin as needed.      [name] lisinopril (PRINIVIL,ZESTRIL) 40 MG tablet Take 1 tablet (40 mg total) by mouth daily.  90 tablet  3  . niacin (NIASPAN) 1000 MG CR tablet Take 1,000 mg by mouth at bedtime.      [name] omeprazole (PRILOSEC) 40 MG capsule Take 40 mg by mouth every other day.       . simvastatin (ZOCOR) 10 MG tablet Take 10 mg by mouth daily at 6 PM.       . traZODone (DESYREL) 50 MG  tablet Take 50 mg by mouth at bedtime.       No current facility-administered medications for this visit.    Allergies  Amoxicillin and Cephalexin  Electrocardiogram: SB PVC;s normal ST segments   Assessment and Plan

## 2013-10-15 NOTE — Assessment & Plan Note (Signed)
Poorly controlled.  Add amlodipine  Change HCTZ to lasix and lisniopril to cozaar  F/u next available BMET in 4 weeks

## 2013-10-15 NOTE — Patient Instructions (Addendum)
Your physician recommends that you schedule a follow-up appointment in: NEXT  AVAILABLE WITH DR [facility]  Your physician has recommended you make the following change in your medication: STOP  LISINOPRIL AND   HCTZ START   LOSARTAN  100 MG  AM AMLODIPINE 10 MG   AFTERNOON FUROSEMIDE  20  MG  AT   4:00 PM  Your physician recommends that you return for lab work in:  [place]

## 2013-11-12 DIAGNOSIS — F430 Acute stress reaction: Secondary | ICD-10-CM | POA: Insufficient documentation

## 2013-11-12 DIAGNOSIS — E669 Obesity, unspecified: Secondary | ICD-10-CM | POA: Insufficient documentation

## 2013-11-12 DIAGNOSIS — H9190 Unspecified hearing loss, unspecified ear: Secondary | ICD-10-CM | POA: Insufficient documentation

## 2013-11-12 DIAGNOSIS — G5640 Causalgia of unspecified upper limb: Secondary | ICD-10-CM | POA: Insufficient documentation

## 2013-11-12 DIAGNOSIS — L500 Allergic urticaria: Secondary | ICD-10-CM | POA: Insufficient documentation

## 2013-11-12 DIAGNOSIS — I493 Ventricular premature depolarization: Secondary | ICD-10-CM | POA: Insufficient documentation

## 2013-11-12 DIAGNOSIS — F528 Other sexual dysfunction not due to a substance or known physiological condition: Secondary | ICD-10-CM | POA: Insufficient documentation

## 2013-11-12 DIAGNOSIS — R531 Weakness: Secondary | ICD-10-CM | POA: Insufficient documentation

## 2013-11-12 DIAGNOSIS — D126 Benign neoplasm of colon, unspecified: Secondary | ICD-10-CM | POA: Insufficient documentation

## 2013-11-12 DIAGNOSIS — M542 Cervicalgia: Secondary | ICD-10-CM | POA: Insufficient documentation

## 2013-11-12 DIAGNOSIS — J309 Allergic rhinitis, unspecified: Secondary | ICD-10-CM | POA: Insufficient documentation

## 2013-11-12 DIAGNOSIS — M5412 Radiculopathy, cervical region: Secondary | ICD-10-CM | POA: Insufficient documentation

## 2013-11-12 DIAGNOSIS — G4700 Insomnia, unspecified: Secondary | ICD-10-CM | POA: Insufficient documentation

## 2013-11-12 DIAGNOSIS — R42 Dizziness and giddiness: Secondary | ICD-10-CM | POA: Insufficient documentation

## 2013-11-18 ENCOUNTER — Other Ambulatory Visit: Payer: Self-pay | Admitting: *Deleted

## 2013-11-18 ENCOUNTER — Other Ambulatory Visit (INDEPENDENT_AMBULATORY_CARE_PROVIDER_SITE_OTHER): Payer: Medicare Other

## 2013-11-18 DIAGNOSIS — Z79899 Other long term (current) drug therapy: Secondary | ICD-10-CM

## 2013-11-18 LAB — BASIC METABOLIC PANEL
BUN: 36 mg/dL — AB (ref 6–23)
CALCIUM: 9.4 mg/dL (ref 8.4–10.5)
CO2: 26 meq/L (ref 19–32)
CREATININE: 1.5 mg/dL (ref 0.4–1.5)
Chloride: 102 mEq/L (ref 96–112)
GFR: 47.05 mL/min — ABNORMAL LOW (ref >60.00)
Glucose, Bld: 114 mg/dL — ABNORMAL HIGH (ref 70–99)
Potassium: 4.1 mEq/L (ref 3.5–5.1)
Sodium: 141 mEq/L (ref 135–145)

## 2013-11-21 ENCOUNTER — Telehealth: Payer: Self-pay | Admitting: Cardiovascular Disease

## 2013-11-21 NOTE — Telephone Encounter (Signed)
PT AWARE OF LAB RESULTS./CY 

## 2013-11-21 NOTE — Telephone Encounter (Signed)
New message ° ° ° ° ° °Returning a nurses call °

## 2013-12-02 ENCOUNTER — Encounter: Payer: Self-pay | Admitting: Cardiovascular Disease

## 2013-12-02 ENCOUNTER — Ambulatory Visit (INDEPENDENT_AMBULATORY_CARE_PROVIDER_SITE_OTHER): Payer: Medicare Other | Admitting: Cardiovascular Disease

## 2013-12-02 VITALS — BP 140/56 | HR 60 | Ht 67.0 in | Wt 210.0 lb

## 2013-12-02 DIAGNOSIS — I2510 Atherosclerotic heart disease of native coronary artery without angina pectoris: Secondary | ICD-10-CM

## 2013-12-02 DIAGNOSIS — J438 Other emphysema: Secondary | ICD-10-CM

## 2013-12-02 DIAGNOSIS — I2583 Coronary atherosclerosis due to lipid rich plaque: Secondary | ICD-10-CM

## 2013-12-02 DIAGNOSIS — I10 Essential (primary) hypertension: Secondary | ICD-10-CM

## 2013-12-02 MED ORDER — HYDRALAZINE HCL 50 MG PO TABS: 50.0000 mg | ORAL_TABLET | Freq: Three times a day (TID) | ORAL | Status: DC

## 2013-12-02 NOTE — Assessment & Plan Note (Signed)
Stable with no angina and good activity level.  Continue medical Rx  

## 2013-12-02 NOTE — Progress Notes (Signed)
Patient ID: Mark Rivers, male   DOB: [birth year], 79 y.o.   MRN: [id number] 79 yo obese male self referred for f/u of CAD He had angioplasty in [place] late 1980s had not been cathed since. He had SSCP at that time. BP has been high Lisinopril recently increased. May need back surgery. History of smoking and asbestos exposure with exertional dyspnea Obese Tried liquid diet last year burt got diarrhea. Reviewed records  From Dr Lynnea Ferrier Cards. Echo with LVH and diastolic relaxation abnormality Normal EF no significant valve disease. Myovue with no ischemia and normal EF He denies  SSCP just dyspnea. Quit smoking and drinking years ago. Has had temporary tens unit in back and needs f/u for permanent surgery Compliant with meds Takes BP at home and been running high Last visit increased ARB added lasix  Beta blockade limited by bradycardia  PFTls showed moderate COPD referred to pulmonary  Wants to do liquid diet and I think his heart is fine for this Had diarrhea with it last time but thinks it was from too much ice    Back and shoulder pain improved no immanent surgery needed     ROS: Denies fever, malais, weight loss, blurry vision, decreased visual acuity, cough, sputum, SOB, hemoptysis, pleuritic pain, palpitaitons, heartburn, abdominal pain, melena, lower extremity edema, claudication, or rash.  All other systems reviewed and negative  General: Affect appropriate Obese white male  HEENT: normal Neck supple with no adenopathy JVP normal no bruits no thyromegaly Lungs clear with no wheezing and good diaphragmatic motion Heart:  S1/S2 no murmur, no rub, gallop or click PMI normal Abdomen: benighn, BS positve, no tenderness, no AAA no bruit.  No HSM or HJR Distal pulses intact with no bruits No edema Neuro non-focal Skin warm and dry No muscular weakness   Current Outpatient Prescriptions  Medication Sig Dispense Refill  . amLODipine (NORVASC) 10 MG tablet Take 1 tablet (10  mg total) by mouth daily. 90 tablet 3  . carvedilol (COREG) 6.25 MG tablet Take 6.25 mg by mouth daily.     . fenofibrate 54 MG tablet Take 54 mg by mouth daily.    . furosemide (LASIX) 20 MG tablet Take 1 tablet (20 mg total) by mouth daily. 90 tablet 3  . hydrALAZINE (APRESOLINE) 50 MG tablet Take 1 tablet (50 mg total) by mouth 2 (two) times daily. 180 tablet 3  . ibuprofen (ADVIL,MOTRIN) 200 MG tablet Take 200 mg by mouth daily as needed.    . insulin glargine (LANTUS) 100 UNIT/ML injection Inject into the skin as needed.    [name] losartan (COZAAR) 100 MG tablet Take 1 tablet (100 mg total) by mouth daily. 90 tablet 3  . niacin (NIASPAN) 1000 MG CR tablet Take 1,000 mg by mouth at bedtime.    [name] omeprazole (PRILOSEC) 40 MG capsule Take 40 mg by mouth every other day.     . simvastatin (ZOCOR) 10 MG tablet Take 10 mg by mouth daily at 6 PM.     . traZODone (DESYREL) 50 MG tablet Take 50 mg by mouth at bedtime.     No current facility-administered medications for this visit.    Allergies  Amoxicillin and Cephalexin  Electrocardiogram:  NSR nonspecific ST/ Twave changes   Assessment and Plan

## 2013-12-02 NOTE — Patient Instructions (Signed)
Your physician recommends that you schedule a follow-up appointment in:   [place] has recommended you make the following change in your medication:  INCREASE   HYDRALAZINE  TO  50 MG   THREE TIMES  A  DAY

## 2013-12-02 NOTE — Assessment & Plan Note (Signed)
No active wheezing continue inhalers has had flu shot

## 2013-12-02 NOTE — Assessment & Plan Note (Signed)
Wrote out schedule for him to take meds morning noon and night  Increase hydralazine to 50 tid  Improved

## 2014-01-07 ENCOUNTER — Other Ambulatory Visit: Payer: Self-pay | Admitting: *Deleted

## 2014-01-07 MED ORDER — CARVEDILOL 6.25 MG PO TABS: 6.2500 mg | ORAL_TABLET | Freq: Every day | ORAL | Status: DC

## 2014-01-21 ENCOUNTER — Telehealth: Payer: Self-pay

## 2014-01-21 NOTE — Telephone Encounter (Signed)
Called patient about how much Carvedilol he is taking. Patient is taking Carvedilol 6.25 mg twice daily. Order is for Carvedilol 6.25 mg once daily. Patient states, "Dr. Johnsie Cancel told be to take it twice a day." Informed patient of order and the office would forward this message to Dr. Johnsie Cancel to see if he wants the patient taking Carvedilol 6.25 mg once or twice daily. Patient verbalized understanding and will wait for further instructions.

## 2014-01-22 MED ORDER — CARVEDILOL 6.25 MG PO TABS
6.2500 mg | ORAL_TABLET | Freq: Two times a day (BID) | ORAL | Status: DC
Start: 2014-01-22 — End: 2015-02-22

## 2014-01-22 NOTE — Telephone Encounter (Signed)
Left message to call back  

## 2014-01-22 NOTE — Telephone Encounter (Signed)
Coreg should be bid

## 2014-01-23 NOTE — Telephone Encounter (Signed)
Left message to call back  

## 2014-01-23 NOTE — Telephone Encounter (Signed)
LMTCB ./CY 

## 2014-01-29 NOTE — Telephone Encounter (Signed)
PT  NOTIFIED ./CY 

## 2014-03-02 NOTE — Progress Notes (Signed)
Patient ID: Mark Rivers, male   DOB: [birth year], 80 y.o.   MRN: [id number] 80 y.o.  obese male self referred for f/u of CAD He had angioplasty in [place] late 1980s had not been cathed since. He had SSCP at that time. BP has been high  May need back surgery. History of smoking and asbestos exposure with exertional dyspnea Obese Tried liquid diet last year burt got diarrhea  . Reviewed records From Dr Lynnea Ferrier Cards. Echo with LVH and diastolic relaxation abnormality Normal EF no significant valve disease. Myovue with no ischemia and normal EF He denies SSCP just dyspnea. Quit smoking and drinking years ago. Has had temporary tens unit in back and needs f/u for permanent surgery Compliant with meds Takes BP at home and been running high Last visit increased ARB added lasix  Beta blockade limited by bradycardia  PFTls showed moderate COPD referred to pulmonary  Wants to do liquid diet and I think his heart is fine for this Had diarrhea with it last time but thinks it was from too much ice   Seeing nephrologist in [place]  Cr must be more elevated as ACE and HCTZ stopped   11/15 hydralazine increased for HTN   ROS: Denies fever, malais, weight loss, blurry vision, decreased visual acuity, cough, sputum, SOB, hemoptysis, pleuritic pain, palpitaitons, heartburn, abdominal pain, melena, lower extremity edema, claudication, or rash.  All other systems reviewed and negative  General: Affect appropriate Obese white male  HEENT: normal Neck supple with no adenopathy JVP normal no bruits no thyromegaly Lungs clear with no wheezing and good diaphragmatic motion Heart:  S1/S2 no murmur, no rub, gallop or click PMI normal Abdomen: benighn, BS positve, no tenderness, no AAA no bruit.  No HSM or HJR Distal pulses intact with no bruits No edema Neuro non-focal Skin warm and dry No muscular weakness   Current Outpatient Prescriptions  Medication Sig Dispense Refill  . amLODipine  (NORVASC) 10 MG tablet Take 1 tablet (10 mg total) by mouth daily. 90 tablet 3  . carvedilol (COREG) 6.25 MG tablet Take 1 tablet (6.25 mg total) by mouth 2 (two) times daily with a meal. 30 tablet 12  . fenofibrate 54 MG tablet Take 54 mg by mouth daily.    . furosemide (LASIX) 20 MG tablet Take 1 tablet (20 mg total) by mouth daily. 90 tablet 3  . hydrALAZINE (APRESOLINE) 50 MG tablet Take 1 tablet (50 mg total) by mouth 3 (three) times daily. 270 tablet 3  . ibuprofen (ADVIL,MOTRIN) 200 MG tablet Take 200 mg by mouth daily as needed.    . insulin glargine (LANTUS) 100 UNIT/ML injection Inject into the skin as needed.    [name] losartan (COZAAR) 100 MG tablet Take 1 tablet (100 mg total) by mouth daily. 90 tablet 3  . niacin (NIASPAN) 1000 MG CR tablet Take 1,000 mg by mouth at bedtime.    [name] omeprazole (PRILOSEC) 40 MG capsule Take 40 mg by mouth every other day.     . simvastatin (ZOCOR) 10 MG tablet Take 10 mg by mouth daily at 6 PM.     . traZODone (DESYREL) 50 MG tablet Take 50 mg by mouth at bedtime.     No current facility-administered medications for this visit.    Allergies  Amoxicillin and Cephalexin  Electrocardiogram:  NSR nonspecific ST/ Twave changes  11/15   Assessment and Plan

## 2014-03-03 ENCOUNTER — Ambulatory Visit (INDEPENDENT_AMBULATORY_CARE_PROVIDER_SITE_OTHER): Payer: Medicare Other | Admitting: Cardiovascular Disease

## 2014-03-03 ENCOUNTER — Encounter: Payer: Self-pay | Admitting: Cardiovascular Disease

## 2014-03-03 VITALS — BP 130/70 | HR 51 | Ht 67.0 in | Wt 225.1 lb

## 2014-03-03 DIAGNOSIS — J432 Centrilobular emphysema: Secondary | ICD-10-CM

## 2014-03-03 DIAGNOSIS — I2510 Atherosclerotic heart disease of native coronary artery without angina pectoris: Secondary | ICD-10-CM

## 2014-03-03 DIAGNOSIS — I150 Renovascular hypertension: Secondary | ICD-10-CM

## 2014-03-03 DIAGNOSIS — I2583 Coronary atherosclerosis due to lipid rich plaque: Secondary | ICD-10-CM

## 2014-03-03 NOTE — Patient Instructions (Signed)
Your physician wants you to follow-up in:  6 MONTHS WITH DR NISHAN  You will receive a reminder letter in the mail two months in advance. If you don't receive a letter, please call our office to schedule the follow-up appointment. Your physician recommends that you continue on your current medications as directed. Please refer to the Current Medication list given to you today. 

## 2014-03-03 NOTE — Assessment & Plan Note (Signed)
Reviewed home readings and they are fine  Recent stoppage of ACE and diuretic by nephrology Will get copy of their office Note and labs.

## 2014-03-03 NOTE — Assessment & Plan Note (Signed)
Moderate no active wheezing  Last PFT 4/15  TLC normal DLCO 63% and FEV1 1.73  Not prohibitive of any ortho surgery if needed

## 2014-03-03 NOTE — Assessment & Plan Note (Signed)
Stable with no angina and good activity level.  Continue medical Rx  

## 2014-03-04 DIAGNOSIS — N183 Chronic kidney disease, stage 3 unspecified: Secondary | ICD-10-CM | POA: Insufficient documentation

## 2014-03-04 HISTORY — DX: Chronic kidney disease, stage 3 unspecified: N18.30

## 2014-03-13 DIAGNOSIS — I10 Essential (primary) hypertension: Secondary | ICD-10-CM | POA: Insufficient documentation

## 2014-03-13 DIAGNOSIS — M159 Polyosteoarthritis, unspecified: Secondary | ICD-10-CM | POA: Insufficient documentation

## 2014-03-17 ENCOUNTER — Telehealth: Payer: Self-pay | Admitting: Cardiovascular Disease

## 2014-03-17 NOTE — Telephone Encounter (Signed)
Received medical records from [facility]. Sent to Dr. Johnsie Cancel. 03/17/14/ss

## 2014-04-08 ENCOUNTER — Telehealth: Payer: Self-pay | Admitting: Cardiovascular Disease

## 2014-04-08 NOTE — Telephone Encounter (Signed)
**Note De-Identified  Obfuscation** The pt had question concerning taking Amlodipine and Lasix together as he states "Furosemide is suppose to help me get rid of my fluid but Amlodipine has a side effect of causing fluid retention".  He states that he does not have any edema, SOB or coughing at this time and has not since he began taking these meds. He is advised to continue to take both medications and that if he develops edema, SOB/coughing to contact the office. He verbalized understanding and agrees with plan.

## 2014-04-08 NOTE — Telephone Encounter (Signed)
New Message  Pt wanted to speak w/ Rn about contradicting medications. Please call back and discuss.

## 2014-09-24 DIAGNOSIS — M25559 Pain in unspecified hip: Secondary | ICD-10-CM | POA: Insufficient documentation

## 2014-09-24 DIAGNOSIS — Z23 Encounter for immunization: Secondary | ICD-10-CM | POA: Insufficient documentation

## 2014-10-06 ENCOUNTER — Encounter: Payer: Self-pay | Admitting: Cardiovascular Disease

## 2014-10-06 NOTE — Progress Notes (Signed)
Patient ID: Mark Rivers, male   DOB: 02-07-1934, 80 y.o.   MRN: [id number] 80 y.o.  obese male self referred for f/u of CAD He had angioplasty in [place] late 1980s had not been cathed since. He had SSCP at that time. BP has been high  May need back surgery. History of smoking and asbestos exposure with exertional dyspnea Obese Tried liquid diet last year burt got diarrhea  . Reviewed records From Dr Lynnea Ferrier Cards. Echo with LVH and diastolic relaxation abnormality Normal EF no significant valve disease. Myovue with no ischemia and normal EF He denies SSCP just dyspnea. Quit smoking and drinking years ago. Has had temporary tens unit in back and needs f/u for permanent surgery Compliant with meds Takes BP at home and been running high Last visit increased ARB added lasix  Beta blockade limited by bradycardia  PFTls showed moderate COPD referred to pulmonary  Wants to do liquid diet and I think his heart is fine for this Had diarrhea with it last time but thinks it was from too much ice   Seeing nephrologist in [place]  Cr must be more elevated as ACE and HCTZ stopped   11/15 hydralazine increased for HTN  11/15  Cr was 1.5    Having right hip pain and may need surgery with Dr Berenice Primas ROS: Denies fever, malais, weight loss, blurry vision, decreased visual acuity, cough, sputum, SOB, hemoptysis, pleuritic pain, palpitaitons, heartburn, abdominal pain, melena, lower extremity edema, claudication, or rash.  All other systems reviewed and negative  General: Affect appropriate Obese white male  HEENT: normal Neck supple with no adenopathy JVP normal no bruits no thyromegaly Lungs clear with no wheezing and good diaphragmatic motion Heart:  S1/S2 no murmur, no rub, gallop or click PMI normal Abdomen: benighn, BS positve, no tenderness, no AAA no bruit.  No HSM or HJR Distal pulses intact with no bruits No edema Neuro non-focal Skin warm and dry No muscular weakness   Current  Outpatient Prescriptions  Medication Sig Dispense Refill  . amLODipine (NORVASC) 10 MG tablet Take 1 tablet (10 mg total) by mouth daily. 90 tablet 3  . carvedilol (COREG) 6.25 MG tablet Take 1 tablet (6.25 mg total) by mouth 2 (two) times daily with a meal. 30 tablet 12  . cyanocobalamin ([employer] VITAMIN B12) 2000 MCG tablet Take 2,000 mcg by mouth daily.    . fenofibrate 54 MG tablet Take 54 mg by mouth daily.    . furosemide (LASIX) 20 MG tablet Take 1 tablet (20 mg total) by mouth daily. 90 tablet 3  . Glucosamine-Chondroit-Vit C-Mn (GLUCOSAMINE CHONDR 1500 COMPLX PO) Take 1 tablet by mouth daily.    . hydrALAZINE (APRESOLINE) 50 MG tablet Take 75 mg by mouth 3 (three) times daily.    [name] losartan (COZAAR) 100 MG tablet Take 1 tablet (100 mg total) by mouth daily. 90 tablet 3  . niacin (NIASPAN) 1000 MG CR tablet Take 1,000 mg by mouth at bedtime.    [name] omeprazole (PRILOSEC) 40 MG capsule Take 40 mg by mouth daily.    . simvastatin (ZOCOR) 10 MG tablet Take 10 mg by mouth daily at 6 PM.     . TOUJEO SOLOSTAR 300 UNIT/ML SOPN Inject 20 Units as directed at bedtime.    . traZODone (DESYREL) 50 MG tablet Take 50 mg by mouth at bedtime.     No current facility-administered medications for this visit.    Allergies  Amoxicillin and Cephalexin  Electrocardiogram:  NSR  nonspecific ST/ Twave changes  11/15   10/08/14  SR rate 56  PAC low voltage normal ST segments   Assessment and Plan CAD:  Distant history.  ECG ok  Given possible need for ortho surgery will order lexiscan myovue HTN:  Well controlled.  Continue current medications and low sodium Dash type diet.  He thinks norvasc or cozaar is causing pruritis Will omit each for a week or so and see  Home BP readings reviewed and fine  Chol:  On statin labs with primary  Pulm:  COPD stable no active wheezing   F/U with me in 6 months  Myovue ordered   Jenkins Rouge

## 2014-10-08 ENCOUNTER — Ambulatory Visit (INDEPENDENT_AMBULATORY_CARE_PROVIDER_SITE_OTHER): Payer: Medicare Other | Admitting: Cardiovascular Disease

## 2014-10-08 ENCOUNTER — Encounter: Payer: Self-pay | Admitting: Cardiovascular Disease

## 2014-10-08 ENCOUNTER — Telehealth: Payer: Self-pay | Admitting: Cardiovascular Disease

## 2014-10-08 VITALS — BP 130/56 | HR 56 | Ht 68.0 in | Wt 210.8 lb

## 2014-10-08 DIAGNOSIS — Z01818 Encounter for other preprocedural examination: Secondary | ICD-10-CM | POA: Diagnosis not present

## 2014-10-08 DIAGNOSIS — I2510 Atherosclerotic heart disease of native coronary artery without angina pectoris: Secondary | ICD-10-CM

## 2014-10-08 DIAGNOSIS — I10 Essential (primary) hypertension: Secondary | ICD-10-CM

## 2014-10-08 DIAGNOSIS — I2583 Coronary atherosclerosis due to lipid rich plaque: Secondary | ICD-10-CM

## 2014-10-08 NOTE — Telephone Encounter (Signed)
New message      Pt want to make sure he is not walking on the treadmill--he has hip problems.  Please call

## 2014-10-08 NOTE — Patient Instructions (Addendum)
Medication Instructions:  Your physician recommends that you continue on your current medications as directed. Please refer to the Current Medication list given to you today. ADJUST  2  MEDS   TO  SEE  IF  CAUSING THE   ITCHING   Labwork: NONE  Testing/Procedures: Your physician has requested that you have a lexiscan myoview. For further information please visit [email or web address]. Please follow instruction sheet, as given.  Follow-Up: Your physician wants you to follow-up in [place] Johnsie Cancel :  You will receive a reminder letter in the mail two months in advance. If you don't receive a letter, please call our office to schedule the follow-up appointment.  Any Other Special Instructions Will Be Listed Below (If Applicable).

## 2014-10-08 NOTE — Telephone Encounter (Signed)
LEFT  VOICE MAIL THAT IS  HAVING    LEXISCAN  SO  WILL NOT  BE  WALKING ON TREADMILL ./CY

## 2014-10-14 ENCOUNTER — Telehealth (HOSPITAL_COMMUNITY): Payer: Self-pay | Admitting: *Deleted

## 2014-10-14 NOTE — Telephone Encounter (Signed)
Patient given detailed instructions per Myocardial Perfusion Study Information Sheet for test on 10/19/14 at 945. Patient notified to arrive 15 minutes early and that it is imperative to arrive on time for appointment to keep from having the test rescheduled.  If you need to cancel or reschedule your appointment, please call the office within 24 hours of your appointment. Failure to do so may result in a cancellation of your appointment, and a $50 no show fee. Patient verbalized understanding.Hubbard Robinson, RN

## 2014-10-19 ENCOUNTER — Ambulatory Visit (HOSPITAL_COMMUNITY): Payer: Medicare Other | Attending: Internal Medicine

## 2014-10-19 DIAGNOSIS — I2510 Atherosclerotic heart disease of native coronary artery without angina pectoris: Secondary | ICD-10-CM

## 2014-10-19 DIAGNOSIS — E119 Type 2 diabetes mellitus without complications: Secondary | ICD-10-CM | POA: Insufficient documentation

## 2014-10-19 DIAGNOSIS — I10 Essential (primary) hypertension: Secondary | ICD-10-CM | POA: Diagnosis not present

## 2014-10-19 DIAGNOSIS — Z01818 Encounter for other preprocedural examination: Secondary | ICD-10-CM | POA: Insufficient documentation

## 2014-10-19 DIAGNOSIS — R0609 Other forms of dyspnea: Secondary | ICD-10-CM | POA: Insufficient documentation

## 2014-10-19 DIAGNOSIS — I2583 Coronary atherosclerosis due to lipid rich plaque: Secondary | ICD-10-CM

## 2014-10-19 DIAGNOSIS — R002 Palpitations: Secondary | ICD-10-CM | POA: Insufficient documentation

## 2014-10-19 LAB — MYOCARDIAL PERFUSION IMAGING
CSEPPHR: 77 {beats}/min
LHR: 0.35
LVDIAVOL: 154 mL
LVSYSVOL: 76 mL
NUC STRESS TID: 0.96
Rest HR: 61 {beats}/min
SDS: 0
SRS: 3
SSS: 4

## 2014-10-19 MED ORDER — REGADENOSON 0.4 MG/5ML IV SOLN
0.4000 mg | Freq: Once | INTRAVENOUS | Status: AC
  Administered 2014-10-19: 0.4 mg via INTRAVENOUS

## 2014-10-19 MED ORDER — TECHNETIUM TC 99M SESTAMIBI GENERIC - CARDIOLITE
32.8000 | Freq: Once | INTRAVENOUS | Status: AC | PRN
  Administered 2014-10-19: 32.8 via INTRAVENOUS

## 2014-10-19 MED ORDER — TECHNETIUM TC 99M SESTAMIBI GENERIC - CARDIOLITE
11.0000 | Freq: Once | INTRAVENOUS | Status: AC | PRN
  Administered 2014-10-19: 11 via INTRAVENOUS

## 2014-10-20 ENCOUNTER — Encounter: Payer: Self-pay | Admitting: Cardiology

## 2014-10-22 ENCOUNTER — Other Ambulatory Visit: Payer: Self-pay | Admitting: Cardiovascular Disease

## 2014-12-24 ENCOUNTER — Other Ambulatory Visit: Payer: Self-pay | Admitting: *Deleted

## 2014-12-24 MED ORDER — LOSARTAN POTASSIUM 100 MG PO TABS: 100.0000 mg | ORAL_TABLET | Freq: Every day | ORAL | Status: DC

## 2014-12-24 NOTE — Telephone Encounter (Signed)
Per last office visit patient was going to hold the losartan to see if it was causing the itching that he was having. Received an rx request from [employer] so I called the patient and he tells me that he has been taking this and would like for it to be refilled. Rx sent in for patient.

## 2015-02-17 ENCOUNTER — Encounter: Payer: Self-pay | Admitting: Cardiovascular Disease

## 2015-02-17 NOTE — Progress Notes (Signed)
Patient ID: Mark Rivers, male   DOB: [birth year], 81 y.o.   MRN: [id number] 81 y.o.  obese male self referred for f/u of CAD He had angioplasty in [place] late 1980s had not been cathed since. He had SSCP at that time. BP has been high   History of smoking and asbestos exposure with exertional dyspnea Obese Tried liquid diet last year burt got diarrhea  . Reviewed records From Dr Lynnea Ferrier Cards. Echo with LVH and diastolic relaxation abnormality Normal EF no significant valve disease. Myovue with no ischemia and normal EF He denies SSCP just dyspnea. Quit smoking and drinking years ago. Has had temporary tens unit in back and needs f/u for permanent surgery Compliant with meds Takes BP at home and been running high Last visit increased ARB added lasix  Beta blockade limited by bradycardia  PFTls showed moderate COPD referred to pulmonary  Wants to do liquid diet and I think his heart is fine for this Had diarrhea with it last time but thinks it was from too much ice   Seeing nephrologist in [place]  Cr must be more elevated as ACE and HCTZ stopped   11/15 hydralazine increased for HTN  11/15  Cr was 1.5    Having right hip pain and may need surgery with Dr Berenice Primas  Myovue done  10/19/14 normal no ischemia or infarct EF 51%   ROS: Denies fever, malais, weight loss, blurry vision, decreased visual acuity, cough, sputum, SOB, hemoptysis, pleuritic pain, palpitaitons, heartburn, abdominal pain, melena, lower extremity edema, claudication, or rash.  All other systems reviewed and negative  General: Affect appropriate Obese white male  HEENT: normal Neck supple with no adenopathy JVP normal no bruits no thyromegaly Lungs clear with no wheezing and good diaphragmatic motion Heart:  S1/S2 no murmur, no rub, gallop or click PMI normal Abdomen: benighn, BS positve, no tenderness, no AAA no bruit.  No HSM or HJR Distal pulses intact with no bruits No edema Neuro  non-focal Excoriations LEs from pruritis  No muscular weakness   Current Outpatient Prescriptions  Medication Sig Dispense Refill  . amLODipine (NORVASC) 10 MG tablet TAKE 1 TABLET (10 MG TOTAL) BY MOUTH DAILY. 90 tablet 1  . carvedilol (COREG) 6.25 MG tablet Take 1 tablet (6.25 mg total) by mouth 2 (two) times daily with a meal. 30 tablet 12  . cyanocobalamin ([employer] VITAMIN B12) 2000 MCG tablet Take 2,000 mcg by mouth daily.    . fenofibrate 54 MG tablet Take 54 mg by mouth daily.    . furosemide (LASIX) 20 MG tablet TAKE 1 TABLET (20 MG TOTAL) BY MOUTH DAILY. 90 tablet 1  . Glucosamine-Chondroit-Vit C-Mn (GLUCOSAMINE CHONDR 1500 COMPLX PO) Take 1 tablet by mouth daily.    . hydrALAZINE (APRESOLINE) 50 MG tablet Take 75 mg by mouth 3 (three) times daily.    [name] losartan (COZAAR) 100 MG tablet Take 1 tablet (100 mg total) by mouth daily. 90 tablet 2  . niacin (NIASPAN) 1000 MG CR tablet Take 1,000 mg by mouth at bedtime.    [name] omeprazole (PRILOSEC) 40 MG capsule Take 40 mg by mouth daily.    . simvastatin (ZOCOR) 10 MG tablet Take 10 mg by mouth daily at 6 PM.     . TOUJEO SOLOSTAR 300 UNIT/ML SOPN Inject 20 Units as directed at bedtime.    . traZODone (DESYREL) 50 MG tablet Take 50 mg by mouth at bedtime.     No current facility-administered medications for this visit.  Allergies  Amoxicillin and Cephalexin  Electrocardiogram:  NSR nonspecific ST/ Twave changes  11/15   10/08/14  SR rate 56  PAC low voltage normal ST segments   Assessment and Plan CAD:  Distant history.  ECG ok  Non ischemic myovue 10/2014   HTN:  Well controlled.  Continue current medications and low sodium Dash type diet.  He thinks norvasc or cozaar is causing pruritis Will omit each for a week or so and see  Home BP readings reviewed and fine  Chol:  On statin labs with primary  Pulm:  COPD stable no active wheezing  Pruritis:  Not related to cozaar or norvasc  Will stop fenofibrate and trazodone and f/u with  dermatiology   F/U with me in 6 months    Jenkins Rouge

## 2015-02-18 ENCOUNTER — Encounter: Payer: Self-pay | Admitting: Cardiovascular Disease

## 2015-02-18 ENCOUNTER — Ambulatory Visit (INDEPENDENT_AMBULATORY_CARE_PROVIDER_SITE_OTHER): Payer: Medicare Other | Admitting: Cardiovascular Disease

## 2015-02-18 VITALS — BP 132/50 | HR 57 | Ht 68.0 in | Wt 219.4 lb

## 2015-02-18 DIAGNOSIS — I2584 Coronary atherosclerosis due to calcified coronary lesion: Secondary | ICD-10-CM | POA: Diagnosis not present

## 2015-02-18 DIAGNOSIS — I10 Essential (primary) hypertension: Secondary | ICD-10-CM

## 2015-02-18 DIAGNOSIS — I2510 Atherosclerotic heart disease of native coronary artery without angina pectoris: Secondary | ICD-10-CM

## 2015-02-18 NOTE — Patient Instructions (Signed)

## 2015-02-22 ENCOUNTER — Other Ambulatory Visit: Payer: Self-pay | Admitting: *Deleted

## 2015-02-22 MED ORDER — CARVEDILOL 6.25 MG PO TABS: 6.2500 mg | ORAL_TABLET | Freq: Two times a day (BID) | ORAL | Status: DC

## 2015-02-24 DIAGNOSIS — L209 Atopic dermatitis, unspecified: Secondary | ICD-10-CM | POA: Insufficient documentation

## 2015-03-11 DIAGNOSIS — N183 Chronic kidney disease, stage 3 unspecified: Secondary | ICD-10-CM | POA: Insufficient documentation

## 2015-03-11 DIAGNOSIS — M898X9 Other specified disorders of bone, unspecified site: Secondary | ICD-10-CM | POA: Insufficient documentation

## 2015-03-11 DIAGNOSIS — E1122 Type 2 diabetes mellitus with diabetic chronic kidney disease: Secondary | ICD-10-CM | POA: Insufficient documentation

## 2015-03-11 DIAGNOSIS — M9080 Osteopathy in diseases classified elsewhere, unspecified site: Secondary | ICD-10-CM

## 2015-03-11 DIAGNOSIS — E559 Vitamin D deficiency, unspecified: Secondary | ICD-10-CM | POA: Insufficient documentation

## 2015-03-11 DIAGNOSIS — E889 Metabolic disorder, unspecified: Secondary | ICD-10-CM | POA: Insufficient documentation

## 2015-03-11 DIAGNOSIS — E119 Type 2 diabetes mellitus without complications: Secondary | ICD-10-CM | POA: Insufficient documentation

## 2015-04-23 ENCOUNTER — Other Ambulatory Visit: Payer: Self-pay | Admitting: Cardiovascular Disease

## 2015-05-10 ENCOUNTER — Other Ambulatory Visit: Payer: Self-pay | Admitting: *Deleted

## 2015-05-10 MED ORDER — CARVEDILOL 6.25 MG PO TABS: 6.2500 mg | ORAL_TABLET | Freq: Two times a day (BID) | ORAL | Status: DC

## 2015-06-25 DIAGNOSIS — J0100 Acute maxillary sinusitis, unspecified: Secondary | ICD-10-CM | POA: Insufficient documentation

## 2015-08-10 ENCOUNTER — Other Ambulatory Visit: Payer: Self-pay | Admitting: Cardiovascular Disease

## 2015-08-25 ENCOUNTER — Ambulatory Visit (INDEPENDENT_AMBULATORY_CARE_PROVIDER_SITE_OTHER): Payer: Medicare Other | Admitting: Allergy and Immunology

## 2015-08-25 ENCOUNTER — Encounter: Payer: Self-pay | Admitting: Allergy and Immunology

## 2015-08-25 ENCOUNTER — Telehealth: Payer: Self-pay

## 2015-08-25 VITALS — BP 136/60 | HR 60 | Temp 99.3°F | Resp 16 | Ht 66.4 in | Wt 213.6 lb

## 2015-08-25 DIAGNOSIS — I2510 Atherosclerotic heart disease of native coronary artery without angina pectoris: Secondary | ICD-10-CM

## 2015-08-25 DIAGNOSIS — R0600 Dyspnea, unspecified: Secondary | ICD-10-CM

## 2015-08-25 DIAGNOSIS — Z87891 Personal history of nicotine dependence: Secondary | ICD-10-CM

## 2015-08-25 DIAGNOSIS — J3089 Other allergic rhinitis: Secondary | ICD-10-CM

## 2015-08-25 DIAGNOSIS — H1045 Other chronic allergic conjunctivitis: Secondary | ICD-10-CM

## 2015-08-25 DIAGNOSIS — I2584 Coronary atherosclerosis due to calcified coronary lesion: Secondary | ICD-10-CM

## 2015-08-25 DIAGNOSIS — H1010 Acute atopic conjunctivitis, unspecified eye: Secondary | ICD-10-CM | POA: Insufficient documentation

## 2015-08-25 MED ORDER — OLOPATADINE HCL 0.7 % OP SOLN: 1.0000 [drp] | Freq: Every day | OPHTHALMIC | 5 refills | Status: DC | PRN

## 2015-08-25 MED ORDER — AZELASTINE HCL 0.1 % NA SOLN: 1.0000 | Freq: Two times a day (BID) | NASAL | 5 refills | Status: DC | PRN

## 2015-08-25 MED ORDER — OLOPATADINE HCL 0.2 % OP SOLN: OPHTHALMIC | 5 refills | Status: DC

## 2015-08-25 MED ORDER — LEVOCETIRIZINE DIHYDROCHLORIDE 5 MG PO TABS: 2.5000 mg | ORAL_TABLET | Freq: Every evening | ORAL | 5 refills | Status: DC

## 2015-08-25 NOTE — Patient Instructions (Addendum)
Seasonal and perennial allergic rhinitis  Aeroallergen avoidance measures have been discussed and provided in written form.  A prescription has been provided for levocetirizine, 2.5 mg daily as needed.  A prescription has been provided for azelastine nasal spray, 1-2 sprays per nostril 2 times daily as needed. Proper nasal spray technique has been discussed and demonstrated.   I have also recommended nasal saline spray (i.e., Simply Saline) or nasal saline lavage (i.e., NeilMed) as needed prior to medicated nasal sprays.  If allergen avoidance measures and medications fail to adequately relieve symptoms, aeroallergen immunotherapy will be considered.  Seasonal allergic conjunctivitis  Treatment plan as outlined above for allergic rhinitis.  A prescription has been provided for Pazeo, one drop per eye daily as needed.  I have also recommended eye lubricant drops (i.e., Natural Tears) as needed.  Dyspnea Spirometry today reveals a mild restrictive pattern without significant postbronchodilator improvement.  The restrictive pattern is likely secondary to body habitus.  The dyspnea with mild/moderate exertion may be deconditioning versus early COPD.  Subjective and objective measures of pulmonary function will be followed and the treatment plan will be adjusted accordingly.   Return in about 4 months (around 12/25/2015), or if symptoms worsen or fail to improve.  Reducing Pollen Exposure  The American Academy of Allergy, Asthma and Immunology suggests the following steps to reduce your exposure to pollen during allergy seasons.    1. Do not hang sheets or clothing out to dry; pollen may collect on these items. 2. Do not mow lawns or spend time around freshly cut grass; mowing stirs up pollen. 3. Keep windows closed at night.  Keep car windows closed while driving. 4. Minimize morning activities outdoors, a time when pollen counts are usually at their highest. 5. Stay indoors as much  as possible when pollen counts or humidity is high and on windy days when pollen tends to remain in the air longer. 6. Use air conditioning when possible.  Many air conditioners have filters that trap the pollen spores. 7. Use a HEPA room air filter to remove pollen form the indoor air you breathe.   Control of House Dust Mite Allergen  House dust mites play a major role in allergic asthma and rhinitis.  They occur in environments with high humidity wherever human skin, the food for dust mites is found. High levels have been detected in dust obtained from mattresses, pillows, carpets, upholstered furniture, bed covers, clothes and soft toys.  The principal allergen of the house dust mite is found in its feces.  A gram of dust may contain 1,000 mites and 250,000 fecal particles.  Mite antigen is easily measured in the air during house cleaning activities.    1. Encase mattresses, including the box spring, and pillow, in an air tight cover.  Seal the zipper end of the encased mattresses with wide adhesive tape. 2. Wash the bedding in water of 130 degrees Farenheit weekly.  Avoid cotton comforters/quilts and flannel bedding: the most ideal bed covering is the dacron comforter. 3. Remove all upholstered furniture from the bedroom. 4. Remove carpets, carpet padding, rugs, and non-washable window drapes from the bedroom.  Wash drapes weekly or use plastic window coverings. 5. Remove all non-washable stuffed toys from the bedroom.  Wash stuffed toys weekly. 6. Have the room cleaned frequently with a vacuum cleaner and a damp dust-mop.  The patient should not be in a room which is being cleaned and should wait 1 hour after cleaning before going into the  room. 7. Close and seal all heating outlets in the bedroom.  Otherwise, the room will become filled with dust-laden air.  An electric heater can be used to heat the room. Reduce indoor humidity to less than 50%.  Do not use a humidifier.  Control of Dog or  Cat Allergen  Avoidance is the best way to manage a dog or cat allergy. If you have a dog or cat and are allergic to dog or cats, consider removing the dog or cat from the home. If you have a dog or cat but don't want to find it a new home, or if your family wants a pet even though someone in the household is allergic, here are some strategies that may help keep symptoms at bay:  1. Keep the pet out of your bedroom and restrict it to only a few rooms. Be advised that keeping the dog or cat in only one room will not limit the allergens to that room. 2. Don't pet, hug or kiss the dog or cat; if you do, wash your hands with soap and water. 3. High-efficiency particulate air (HEPA) cleaners run continuously in a bedroom or living room can reduce allergen levels over time. 4. Regular use of a high-efficiency vacuum cleaner or a central vacuum can reduce allergen levels. 5. Giving your dog or cat a bath at least once a week can reduce airborne allergen.  Control of Mold Allergen  Mold and fungi can grow on a variety of surfaces provided certain temperature and moisture conditions exist.  Outdoor molds grow on plants, decaying vegetation and soil.  The major outdoor mold, Alternaria and Cladosporium, are found in very high numbers during hot and dry conditions.  Generally, a late Summer - Fall peak is seen for common outdoor fungal spores.  Rain will temporarily lower outdoor mold spore count, but counts rise rapidly when the rainy period ends.  The most important indoor molds are Aspergillus and Penicillium.  Dark, humid and poorly ventilated basements are ideal sites for mold growth.  The next most common sites of mold growth are the bathroom and the kitchen.  Outdoor [employer] 2. Use air conditioning and keep windows closed 3. Avoid exposure to decaying vegetation. 4. Avoid leaf raking. 5. Avoid grain handling. 6. Consider wearing a face mask if working in moldy areas.  Indoor Mold  Control 1. Maintain humidity below 50%. 2. Clean washable surfaces with 5% bleach solution. 3. Remove sources e.g. Contaminated carpets.

## 2015-08-25 NOTE — Assessment & Plan Note (Signed)
   Treatment plan as outlined above for allergic rhinitis.  A prescription has been provided for Pazeo, one drop per eye daily as needed.  I have also recommended eye lubricant drops (i.e., Natural Tears) as needed. 

## 2015-08-25 NOTE — Progress Notes (Signed)
New Patient Note  RE: Darryon Guarini MRN: [id number] DOB: 1934-11-27 Date of Office Visit: 08/25/2015  Referring provider: [name], MD Primary care provider: [name]., MD  Chief Complaint: Allergic Rhinitis  and Conjunctivitis   History of present illness: Mark Rivers is a 81 y.o. male presenting today for consultation of rhinitis.  He complains of nasal congestion, rhinorrhea, and itchy/watery eyes.   No significant seasonal symptom variation has been noted nor have specific environmental triggers been identified.  The symptoms tend to be worse in the morning time.  He denies postnasal drainage and/or sinus pressure.  He also complains of occasional dyspnea with mild/moderate exertion.  He is a former cigarette smoker having smoked from 1958 to 2010, averaging 2-3 packs per day.   Assessment and plan: Seasonal and perennial allergic rhinitis  Aeroallergen avoidance measures have been discussed and provided in written form.  A prescription has been provided for levocetirizine, 2.5 mg daily as needed.  A prescription has been provided for azelastine nasal spray, 1-2 sprays per nostril 2 times daily as needed. Proper nasal spray technique has been discussed and demonstrated.   I have also recommended nasal saline spray (i.e., Simply Saline) or nasal saline lavage (i.e., NeilMed) as needed prior to medicated nasal sprays.  If allergen avoidance measures and medications fail to adequately relieve symptoms, aeroallergen immunotherapy will be considered.  Seasonal allergic conjunctivitis  Treatment plan as outlined above for allergic rhinitis.  A prescription has been provided for Pazeo, one drop per eye daily as needed.  I have also recommended eye lubricant drops (i.e., Natural Tears) as needed.  Dyspnea Spirometry today reveals a mild restrictive pattern without significant postbronchodilator improvement.  The restrictive pattern is likely secondary to  body habitus.  The dyspnea with mild/moderate exertion may be deconditioning versus early COPD.  Subjective and objective measures of pulmonary function will be followed and the treatment plan will be adjusted accordingly.   Meds ordered this encounter  Medications  . azelastine (ASTELIN) 0.1 % nasal spray    Sig: Place 1-2 sprays into both nostrils 2 (two) times daily as needed for rhinitis.    Dispense:  30 mL    Refill:  5  . Olopatadine HCl (PAZEO) 0.7 % SOLN    Sig: Place 1 drop into both eyes daily as needed.    Dispense:  1 Bottle    Refill:  5  . levocetirizine (XYZAL) 5 MG tablet    Sig: Take 0.5 tablets (2.5 mg total) by mouth every evening.    Dispense:  30 tablet    Refill:  5    Diagnositics: Spirometry: FVC was 2.23 L and FEV1 was 1.64 L without significant postbronchodilator improvement.  Mild restrictive pattern without significant reversibility.  Please see scanned spirometry results for details. Epicutaneous testing: Positive to grass pollens, weed pollens, tree pollens. Intradermal testing: Positive to molds, cat hair, dog epithelia, and dust mite antigen.    Physical examination: Blood pressure 136/60, pulse 60, temperature 99.3 F (37.4 C), temperature source Oral, resp. rate 16, height 5' 6.4" (1.687 m), weight 213 lb 9.6 oz (96.9 kg).  General: Alert, interactive, in no acute distress. HEENT: TMs pearly gray, turbinates edematous and pale without discharge, post-pharynx moderately erythematous. Neck: Supple without lymphadenopathy. Lungs: Mildly decreased breath sounds bilaterally without wheezing, rhonchi or rales. CV: Normal S1, S2 without murmurs. Abdomen: Nondistended, nontender. Skin: Warm and dry, without lesions or rashes. Extremities:  No clubbing, cyanosis or edema. Neuro:  Grossly intact.  Review of systems:  Review of systems negative except as noted in HPI / PMHx or noted below: Review of Systems  Constitutional: Negative.   HENT:  Negative.   Eyes: Negative.   Respiratory: Negative.   Cardiovascular: Negative.   Gastrointestinal: Negative.   Genitourinary: Negative.   Musculoskeletal: Negative.   Skin: Negative.   Neurological: Negative.   Endo/Heme/Allergies: Negative.   Psychiatric/Behavioral: Negative.     Past medical history:  Past Medical History:  Diagnosis Date  . Allergy   . Back pain   . Diabetes mellitus   . GERD (gastroesophageal reflux disease)   . Hypertension   . Neuropathy (HCC)     Past surgical history:  Past Surgical History:  Procedure Laterality Date  . cataract surg     bil/2008  . COLONOSCOPY    . CORONARY STENT PLACEMENT     1988  . TONSILLECTOMY      Family history: Family History  Problem Relation Age of Onset  . Diabetes Father   . Colon cancer Neg Hx   . Esophageal cancer Neg Hx   . Stomach cancer Neg Hx   . Rectal cancer Neg Hx   . Allergic rhinitis Neg Hx   . Angioedema Neg Hx   . Asthma Neg Hx   . Eczema Neg Hx   . Immunodeficiency Neg Hx   . Urticaria Neg Hx     Social history: Social History   Social History  . Marital status: Married    Spouse name: N/A  . Number of children: N/A  . Years of education: N/A   Occupational History  . retired    Social History Main Topics  . Smoking status: Former Smoker    Packs/day: 2.00    Years: 30.00    Types: Cigarettes    Quit date: 06/28/2004  . Smokeless tobacco: Never Used  . Alcohol use No     Comment: QUIT 06/28/2004  . Drug use: No  . Sexual activity: Yes   Other Topics Concern  . Not on file   Social History Narrative  . No narrative on file   Environmental History: The patient lives in a house with hardwood floors throughout, gas heat, and central air.  There are 3 dogs and 1 cat in the house, one dog has access to his bedroom.  He started smoking cigarettes in 1958 and quit in 2010, having averaged 2-3 packs per day.    Medication List       Accurate as of 08/25/15 12:30 PM.  Always use your most recent med list.          amLODipine 10 MG tablet Commonly known as:  NORVASC TAKE 1 TABLET (10 MG TOTAL) BY MOUTH DAILY.   azelastine 0.1 % nasal spray Commonly known as:  ASTELIN Place 1-2 sprays into both nostrils 2 (two) times daily as needed for rhinitis.   carvedilol 6.25 MG tablet Commonly known as:  COREG Take 1 tablet (6.25 mg total) by mouth 2 (two) times daily with a meal.   cholecalciferol 1000 units tablet Commonly known as:  VITAMIN D Take 1,000 Units by mouth daily.   [facility] VITAMIN B12 2000 MCG tablet Generic drug:  cyanocobalamin Take 2,000 mcg by mouth daily.   diphenhydramine-acetaminophen 25-500 MG Tabs tablet Commonly known as:  TYLENOL PM Take 2 tablets by mouth at bedtime as needed.   fenofibrate 54 MG tablet Take 54 mg by mouth daily.   furosemide 20 MG tablet  Commonly known as:  LASIX TAKE 1 TABLET (20 MG TOTAL) BY MOUTH DAILY.   GLUCOSAMINE CHONDR 1500 COMPLX PO Take 1 tablet by mouth daily.   hydrALAZINE 50 MG tablet Commonly known as:  APRESOLINE Take 75 mg by mouth 3 (three) times daily.   levocetirizine 5 MG tablet Commonly known as:  XYZAL Take 0.5 tablets (2.5 mg total) by mouth every evening.   losartan 100 MG tablet Commonly known as:  COZAAR Take 1 tablet (100 mg total) by mouth daily.   niacin 1000 MG CR tablet Commonly known as:  NIASPAN Take 1,000 mg by mouth at bedtime.   Olopatadine HCl 0.7 % Soln Commonly known as:  PAZEO Place 1 drop into both eyes daily as needed.   omeprazole 40 MG capsule Commonly known as:  PRILOSEC Take 40 mg by mouth daily.   simvastatin 10 MG tablet Commonly known as:  ZOCOR Take 10 mg by mouth daily at 6 PM.   TOUJEO SOLOSTAR 300 UNIT/ML Sopn Generic drug:  Insulin Glargine Inject 20 Units as directed at bedtime.   traZODone 50 MG tablet Commonly known as:  DESYREL Take 50 mg by mouth at bedtime.       Known medication allergies: Allergies  Allergen  Reactions  . Amoxicillin Other (See Comments)    Passes out Passes out Passes out  . Cephalexin Other (See Comments)    Passes out Passes out Passes out  . Penicillin G Other (See Comments)    unknown unknown    I appreciate the opportunity to take part in Tykee's care. Please do not hesitate to contact me with questions.  Sincerely,   R. Edgar Frisk, MD

## 2015-08-25 NOTE — Telephone Encounter (Signed)
Per pharmacy. Insurance will not pay for Pazeo but will pay for Pataday at a $15 copay.  Ok per Dr. Verlin Fester to change.

## 2015-08-25 NOTE — Assessment & Plan Note (Addendum)
Spirometry today reveals a mild restrictive pattern without significant postbronchodilator improvement.  The restrictive pattern is likely secondary to body habitus.  The dyspnea with mild/moderate exertion may be deconditioning versus early COPD.  Subjective and objective measures of pulmonary function will be followed and the treatment plan will be adjusted accordingly.

## 2015-08-25 NOTE — Assessment & Plan Note (Addendum)
   Aeroallergen avoidance measures have been discussed and provided in written form.  A prescription has been provided for levocetirizine, 2.5 mg daily as needed.  A prescription has been provided for azelastine nasal spray, 1-2 sprays per nostril 2 times daily as needed. Proper nasal spray technique has been discussed and demonstrated.   I have also recommended nasal saline spray (i.e., Simply Saline) or nasal saline lavage (i.e., NeilMed) as needed prior to medicated nasal sprays.  If allergen avoidance measures and medications fail to adequately relieve symptoms, aeroallergen immunotherapy will be considered.

## 2015-08-26 ENCOUNTER — Telehealth: Payer: Self-pay | Admitting: Allergy and Immunology

## 2015-08-26 NOTE — Telephone Encounter (Signed)
Called in Felida. Was covered at $15 copay per pharmacy.  Patient notified.

## 2015-08-26 NOTE — Telephone Encounter (Signed)
This pt was seen in our office by Dr.Bobbitt yesterday 8/16 and the Pazeo Rx  is not being covered by [employer]. The patient is asking for an alternative to this med so insurance will cover. Pls call pt back. Thanks

## 2015-10-19 NOTE — Progress Notes (Signed)
Cardiology Office Note   Date:  10/20/2015   ID:  Mark Rivers, DOB 1934-02-04, MRN [id number]  PCP:  [name]., MD  Cardiologist:  Dr. Johnsie Cancel    Chief Complaint  Patient presents with  . Chest Pain    over 3 months      History of Present Illness: Mark Rivers is a 81 y.o. male who presents for Chest pain.  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  Pressure would last 15 min pt would rest in chair and it would go away.  No associated symptoms of SOb nausea or diaphoresis, he does use his inhaler but that does not help much.  He is primary caregiver to his wife.     CAD He had angioplasty in [place] late 1980s had not been cathed since. He had SSCP at that time. BP has been high   History of smoking and asbestos exposure with exertional dyspnea Obese Tried liquid diet last year burt got diarrhea  . Reviewed records From Dr Lynnea Ferrier Cards. Echo with LVH and diastolic relaxation abnormality Normal EF no significant valve disease. Myovue with no ischemia and normal EF He denies SSCP just dyspnea. Quit smoking and drinking years ago. Has had temporary tens unit in back and needs f/u for permanent surgery Compliant with meds Takes BP at home and been running high Last visit increased ARB added lasix  Beta blockade limited by bradycardia  PFTls showed moderate COPD referred to pulmonary  Wants to do liquid diet and I think his heart is fine for this Had diarrhea with it last time but thinks it was from too much ice  He tried to lose wt with apple cider vinager and honey and water, but this caused a PH imbalance.  So he stopped.    Seeing nephrologist in [place]    Myovue done  10/19/14 normal no ischemia or infarct EF 51%  Today as above.  Also complains of dizziness at times even at rest -per his home record his HR is in the upper 40s frequently.     Past Medical History:  Diagnosis Date  .  Allergy   . Back pain   . Diabetes mellitus   . GERD (gastroesophageal reflux disease)   . Hypertension   . Neuropathy [facility])     Past Surgical History:  Procedure Laterality Date  . cataract surg     bil/2008  . COLONOSCOPY    . CORONARY STENT PLACEMENT     1988  . TONSILLECTOMY       Current Outpatient Prescriptions  Medication Sig Dispense Refill  . amLODipine (NORVASC) 10 MG tablet TAKE 1 TABLET (10 MG TOTAL) BY MOUTH DAILY. 90 tablet 2  . azelastine (ASTELIN) 0.1 % nasal spray Place 1-2 sprays into both nostrils 2 (two) times daily as needed for rhinitis. 30 mL 5  . carvedilol (COREG) 3.125 MG tablet Take 1 tablet (3.125 mg total) by mouth 2 (two) times daily with a meal. 180 tablet 3  . cholecalciferol (VITAMIN D) 1000 units tablet Take 1,000 Units by mouth daily.    . cyanocobalamin ([facility]S VITAMIN B12) 2000 MCG tablet Take 2,000 mcg by mouth daily.    . diphenhydramine-acetaminophen (TYLENOL PM) 25-500 MG TABS tablet Take 2 tablets by mouth at bedtime as needed.    . fenofibrate 54 MG tablet Take 54 mg by mouth daily.    . furosemide (LASIX) 20 MG  tablet TAKE 1 TABLET (20 MG TOTAL) BY MOUTH DAILY. 90 tablet 0  . Glucosamine-Chondroit-Vit C-Mn (GLUCOSAMINE CHONDR 1500 COMPLX PO) Take 1 tablet by mouth daily.    . hydrALAZINE (APRESOLINE) 50 MG tablet Take 75 mg by mouth 3 (three) times daily.    [name] levocetirizine (XYZAL) 5 MG tablet Take 0.5 tablets (2.5 mg total) by mouth every evening. 30 tablet 5  . losartan (COZAAR) 100 MG tablet Take 1 tablet (100 mg total) by mouth daily. 90 tablet 2  . niacin (NIASPAN) 1000 MG CR tablet Take 1,000 mg by mouth at bedtime.    . Olopatadine HCl (PATADAY) 0.2 % SOLN One drop each eye once a day for itchy eyes. 1 Bottle 5  . omeprazole (PRILOSEC) 40 MG capsule Take 40 mg by mouth daily.    . simvastatin (ZOCOR) 10 MG tablet Take 10 mg by mouth daily at 6 PM.     . TOUJEO SOLOSTAR 300 UNIT/ML SOPN Inject 20 Units as directed at bedtime.    .  traZODone (DESYREL) 50 MG tablet Take 50 mg by mouth at bedtime.    . isosorbide mononitrate (IMDUR) 30 MG 24 hr tablet Take 1 tablet (30 mg total) by mouth daily. 90 tablet 3   No current facility-administered medications for this visit.     Allergies:   Amoxicillin; Cephalexin; and Penicillin g    Social History:  The patient  reports that he quit smoking about 11 years ago. His smoking use included Cigarettes. He has a 60.00 pack-year smoking history. He has never used smokeless tobacco. He reports that he does not drink alcohol or use drugs.   Family History:  The patient's family history includes Diabetes in his father.    ROS:  General:no colds or fevers, no weight changes Skin:no rashes or ulcers HEENT:no blurred vision, no congestion CV:see HPI PUL:see HPI GI:no diarrhea constipation or melena, no indigestion GU:no hematuria, no dysuria MS:no joint pain, no claudication Neuro:no syncope, no lightheadedness Endo:+ diabetes on insulin, no thyroid disease  Wt Readings from Last 3 Encounters:  10/20/15 218 lb 6.4 oz (99.1 kg)  08/25/15 213 lb 9.6 oz (96.9 kg)  02/18/15 219 lb 6.4 oz (99.5 kg)     PHYSICAL EXAM: VS:  BP (!) 150/60 (BP Location: Left Arm, Patient Position: Sitting, Cuff Size: Normal)   Pulse (!) 54   Ht 5\' 6"  (1.676 m)   Wt 218 lb 6.4 oz (99.1 kg)   BMI 35.25 kg/m  , BMI Body mass index is 35.25 kg/m. General:Pleasant affect, NAD Skin:Warm and dry, brisk capillary refill HEENT:normocephalic, sclera clear, mucus membranes moist Neck:supple, no JVD, no bruits  Heart:S1S2 RRR without murmur, gallup, rub or click Lungs:clear without rales, rhonchi, or wheezes [id number], soft, non tender, + BS, do not palpate liver spleen or masses Ext:+ lower ext edema, 2+ pedal pulses, 2+ radial pulses Neuro:alert and oriented, MAE, follows commands, + facial symmetry    EKG:  EKG is ordered today. The ekg ordered today demonstrates Sinus brady at 54 no changes from  previous tracing.    Recent Labs: No results found for requested labs within last 8760 hours.    Lipid Panel No results found for: CHOL, TRIG, HDL, CHOLHDL, VLDL, LDLCALC, LDLDIRECT     Other studies Reviewed: Additional studies/ records that were reviewed today include: previous cath, myoview as above. . Echo 2014 with LVH, and LA and RA moderately dilated.    ASSESSMENT AND PLAN:  1.  Chest pain, daily  at 7 pm for 2-3 months.  Increasingly not feeling well.  Will do lexiscan myoview and in the meantime add imdur 30 mg daily with CKD will do nuc first if + would proceed to cath if Dr. Johnsie Cancel agrees and hold diuretic and ARB prior to procedure.   2. CAD with hx RCA PCTA in 1988  3. DM-2 on insulin  4. CKD- ?3-4 will check labs today.   5. Dizziness with HR to 40s at times will decrease coreg to 3.125 mg BID to give him greater HR..  Follow up depending on results.  Otherwise with Dr. Johnsie Cancel in 2 months.  We discussed if increased pain to call 911 and take his wife with him if necessary.     Current medicines are reviewed with the patient today.  The patient Has no concerns regarding medicines.  The following changes have been made:  See above Labs/ tests ordered today include:see above  Disposition:   FU:  see above  Signed, Cecilie Kicks, NP  10/20/2015 5:18 PM    [facility] [place], [place], [place] Phone: [phone]; Fax: [phone]

## 2015-10-20 ENCOUNTER — Other Ambulatory Visit: Payer: Self-pay | Admitting: Cardiovascular Disease

## 2015-10-20 ENCOUNTER — Encounter: Payer: Self-pay | Admitting: Cardiology

## 2015-10-20 ENCOUNTER — Encounter (INDEPENDENT_AMBULATORY_CARE_PROVIDER_SITE_OTHER): Payer: Self-pay

## 2015-10-20 ENCOUNTER — Ambulatory Visit (INDEPENDENT_AMBULATORY_CARE_PROVIDER_SITE_OTHER): Payer: Medicare Other | Admitting: Cardiology

## 2015-10-20 VITALS — BP 150/60 | HR 54 | Ht 66.0 in | Wt 218.4 lb

## 2015-10-20 DIAGNOSIS — R0789 Other chest pain: Secondary | ICD-10-CM | POA: Diagnosis not present

## 2015-10-20 DIAGNOSIS — I10 Essential (primary) hypertension: Secondary | ICD-10-CM | POA: Diagnosis not present

## 2015-10-20 DIAGNOSIS — I2510 Atherosclerotic heart disease of native coronary artery without angina pectoris: Secondary | ICD-10-CM | POA: Diagnosis not present

## 2015-10-20 DIAGNOSIS — N183 Chronic kidney disease, stage 3 unspecified: Secondary | ICD-10-CM

## 2015-10-20 DIAGNOSIS — R42 Dizziness and giddiness: Secondary | ICD-10-CM

## 2015-10-20 DIAGNOSIS — R001 Bradycardia, unspecified: Secondary | ICD-10-CM

## 2015-10-20 LAB — CBC WITH DIFFERENTIAL/PLATELET
BASOS PCT: 0 %
Basophils Absolute: 0 cells/uL (ref 0–200)
EOS ABS: 763 {cells}/uL — AB (ref 15–500)
Eosinophils Relative: 7 %
HEMATOCRIT: 36.2 % — AB (ref 38.5–50.0)
Hemoglobin: 12.2 g/dL — ABNORMAL LOW (ref 13.2–17.1)
LYMPHS ABS: 2180 {cells}/uL (ref 850–3900)
Lymphocytes Relative: 20 %
MCH: 31.5 pg (ref 27.0–33.0)
MCHC: 33.7 g/dL (ref 32.0–36.0)
MCV: 93.5 fL (ref 80.0–100.0)
MONO ABS: 1199 {cells}/uL — AB (ref 200–950)
MPV: 8.7 fL (ref 7.5–12.5)
Monocytes Relative: 11 %
NEUTROS ABS: 6758 {cells}/uL (ref 1500–7800)
Neutrophils Relative %: 62 %
PLATELETS: 321 10*3/uL (ref 140–400)
RBC: 3.87 MIL/uL — AB (ref 4.20–5.80)
RDW: 14.3 % (ref 11.0–15.0)
WBC: 10.9 10*3/uL — AB (ref 3.8–10.8)

## 2015-10-20 LAB — BASIC METABOLIC PANEL
BUN: 47 mg/dL — ABNORMAL HIGH (ref 7–25)
CHLORIDE: 104 mmol/L (ref 98–110)
CO2: 26 mmol/L (ref 20–31)
Calcium: 9.9 mg/dL (ref 8.6–10.3)
Creat: 1.9 mg/dL — ABNORMAL HIGH (ref 0.70–1.18)
Glucose, Bld: 73 mg/dL (ref 65–99)
POTASSIUM: 4.3 mmol/L (ref 3.5–5.3)
SODIUM: 140 mmol/L (ref 135–146)

## 2015-10-20 MED ORDER — CARVEDILOL 3.125 MG PO TABS
3.1250 mg | ORAL_TABLET | Freq: Two times a day (BID) | ORAL | 3 refills | Status: DC
Start: 2015-10-20 — End: 2015-12-28

## 2015-10-20 MED ORDER — ISOSORBIDE MONONITRATE ER 30 MG PO TB24: 30.0000 mg | ORAL_TABLET | Freq: Every day | ORAL | 3 refills | Status: DC

## 2015-10-20 NOTE — Patient Instructions (Addendum)
Medication Instructions:  Your physician has recommended you make the following change in your medication:  1-START Imdur 30 mg by mouth daily 2-Decrease carvedilol 3.125 mg by mouth twice daily with meals.  Lab work: Your physician recommends that you have lab work today CBC and BMET   Testing/Procedures: Your physician has requested that you have a lexiscan myoview as soon as possible. For further information please visit [email or web address]. Please follow instruction sheet, as given.  Follow-Up: Your physician wants you to follow-up in: 3 months with Dr. Johnsie Cancel.    If you need a refill on your cardiac medications before your next appointment, please call your pharmacy.

## 2015-10-25 ENCOUNTER — Inpatient Hospital Stay (HOSPITAL_COMMUNITY)
Admission: EM | Admit: 2015-10-25 | Discharge: 2015-11-26 | DRG: 003 | Disposition: A | Discharge status: Other Acute Inpatient Hospital | Payer: Medicare Other | Attending: Cardiothoracic Surgery | Admitting: Cardiothoracic Surgery

## 2015-10-25 ENCOUNTER — Encounter (HOSPITAL_COMMUNITY): Payer: Self-pay | Admitting: Emergency Medicine

## 2015-10-25 ENCOUNTER — Emergency Department (HOSPITAL_COMMUNITY): Payer: Medicare Other

## 2015-10-25 DIAGNOSIS — A419 Sepsis, unspecified organism: Secondary | ICD-10-CM | POA: Diagnosis not present

## 2015-10-25 DIAGNOSIS — I214 Non-ST elevation (NSTEMI) myocardial infarction: Secondary | ICD-10-CM | POA: Diagnosis present

## 2015-10-25 DIAGNOSIS — Z419 Encounter for procedure for purposes other than remedying health state, unspecified: Secondary | ICD-10-CM

## 2015-10-25 DIAGNOSIS — N179 Acute kidney failure, unspecified: Secondary | ICD-10-CM | POA: Diagnosis not present

## 2015-10-25 DIAGNOSIS — G9341 Metabolic encephalopathy: Secondary | ICD-10-CM | POA: Diagnosis not present

## 2015-10-25 DIAGNOSIS — J9589 Other postprocedural complications and disorders of respiratory system, not elsewhere classified: Secondary | ICD-10-CM | POA: Diagnosis not present

## 2015-10-25 DIAGNOSIS — Z4659 Encounter for fitting and adjustment of other gastrointestinal appliance and device: Secondary | ICD-10-CM

## 2015-10-25 DIAGNOSIS — E8770 Fluid overload, unspecified: Secondary | ICD-10-CM | POA: Diagnosis not present

## 2015-10-25 DIAGNOSIS — Z9911 Dependence on respirator [ventilator] status: Secondary | ICD-10-CM | POA: Diagnosis not present

## 2015-10-25 DIAGNOSIS — Z4682 Encounter for fitting and adjustment of non-vascular catheter: Secondary | ICD-10-CM

## 2015-10-25 DIAGNOSIS — R410 Disorientation, unspecified: Secondary | ICD-10-CM | POA: Diagnosis not present

## 2015-10-25 DIAGNOSIS — J95822 Acute and chronic postprocedural respiratory failure: Secondary | ICD-10-CM | POA: Diagnosis not present

## 2015-10-25 DIAGNOSIS — R197 Diarrhea, unspecified: Secondary | ICD-10-CM | POA: Diagnosis not present

## 2015-10-25 DIAGNOSIS — I700 Atherosclerosis of aorta: Secondary | ICD-10-CM | POA: Diagnosis present

## 2015-10-25 DIAGNOSIS — Z9689 Presence of other specified functional implants: Secondary | ICD-10-CM

## 2015-10-25 DIAGNOSIS — R570 Cardiogenic shock: Secondary | ICD-10-CM | POA: Diagnosis not present

## 2015-10-25 DIAGNOSIS — I4891 Unspecified atrial fibrillation: Secondary | ICD-10-CM | POA: Diagnosis not present

## 2015-10-25 DIAGNOSIS — L8990 Pressure ulcer of unspecified site, unspecified stage: Secondary | ICD-10-CM | POA: Insufficient documentation

## 2015-10-25 DIAGNOSIS — E873 Alkalosis: Secondary | ICD-10-CM | POA: Diagnosis not present

## 2015-10-25 DIAGNOSIS — N183 Chronic kidney disease, stage 3 (moderate): Secondary | ICD-10-CM | POA: Diagnosis present

## 2015-10-25 DIAGNOSIS — I481 Persistent atrial fibrillation: Secondary | ICD-10-CM | POA: Diagnosis present

## 2015-10-25 DIAGNOSIS — Z938 Other artificial opening status: Secondary | ICD-10-CM

## 2015-10-25 DIAGNOSIS — M6281 Muscle weakness (generalized): Secondary | ICD-10-CM

## 2015-10-25 DIAGNOSIS — D62 Acute posthemorrhagic anemia: Secondary | ICD-10-CM | POA: Diagnosis not present

## 2015-10-25 DIAGNOSIS — J90 Pleural effusion, not elsewhere classified: Secondary | ICD-10-CM

## 2015-10-25 DIAGNOSIS — Z978 Presence of other specified devices: Secondary | ICD-10-CM

## 2015-10-25 DIAGNOSIS — T502X5A Adverse effect of carbonic-anhydrase inhibitors, benzothiadiazides and other diuretics, initial encounter: Secondary | ICD-10-CM | POA: Diagnosis not present

## 2015-10-25 DIAGNOSIS — E559 Vitamin D deficiency, unspecified: Secondary | ICD-10-CM | POA: Diagnosis present

## 2015-10-25 DIAGNOSIS — Z883 Allergy status to other anti-infective agents status: Secondary | ICD-10-CM

## 2015-10-25 DIAGNOSIS — M79661 Pain in right lower leg: Secondary | ICD-10-CM | POA: Diagnosis not present

## 2015-10-25 DIAGNOSIS — K3184 Gastroparesis: Secondary | ICD-10-CM | POA: Diagnosis not present

## 2015-10-25 DIAGNOSIS — I509 Heart failure, unspecified: Secondary | ICD-10-CM | POA: Diagnosis not present

## 2015-10-25 DIAGNOSIS — J9621 Acute and chronic respiratory failure with hypoxia: Secondary | ICD-10-CM | POA: Diagnosis not present

## 2015-10-25 DIAGNOSIS — E1122 Type 2 diabetes mellitus with diabetic chronic kidney disease: Secondary | ICD-10-CM | POA: Diagnosis present

## 2015-10-25 DIAGNOSIS — J9611 Chronic respiratory failure with hypoxia: Secondary | ICD-10-CM | POA: Diagnosis not present

## 2015-10-25 DIAGNOSIS — J189 Pneumonia, unspecified organism: Secondary | ICD-10-CM | POA: Diagnosis not present

## 2015-10-25 DIAGNOSIS — J449 Chronic obstructive pulmonary disease, unspecified: Secondary | ICD-10-CM | POA: Diagnosis present

## 2015-10-25 DIAGNOSIS — I4819 Other persistent atrial fibrillation: Secondary | ICD-10-CM

## 2015-10-25 DIAGNOSIS — R339 Retention of urine, unspecified: Secondary | ICD-10-CM | POA: Diagnosis not present

## 2015-10-25 DIAGNOSIS — E46 Unspecified protein-calorie malnutrition: Secondary | ICD-10-CM | POA: Diagnosis not present

## 2015-10-25 DIAGNOSIS — Q211 Atrial septal defect: Secondary | ICD-10-CM

## 2015-10-25 DIAGNOSIS — G9340 Encephalopathy, unspecified: Secondary | ICD-10-CM | POA: Diagnosis not present

## 2015-10-25 DIAGNOSIS — I130 Hypertensive heart and chronic kidney disease with heart failure and stage 1 through stage 4 chronic kidney disease, or unspecified chronic kidney disease: Secondary | ICD-10-CM | POA: Diagnosis present

## 2015-10-25 DIAGNOSIS — Z930 Tracheostomy status: Secondary | ICD-10-CM | POA: Diagnosis not present

## 2015-10-25 DIAGNOSIS — J440 Chronic obstructive pulmonary disease with acute lower respiratory infection: Secondary | ICD-10-CM | POA: Diagnosis not present

## 2015-10-25 DIAGNOSIS — R1310 Dysphagia, unspecified: Secondary | ICD-10-CM

## 2015-10-25 DIAGNOSIS — J9600 Acute respiratory failure, unspecified whether with hypoxia or hypercapnia: Secondary | ICD-10-CM

## 2015-10-25 DIAGNOSIS — M545 Low back pain: Secondary | ICD-10-CM | POA: Diagnosis present

## 2015-10-25 DIAGNOSIS — M79662 Pain in left lower leg: Secondary | ICD-10-CM | POA: Diagnosis not present

## 2015-10-25 DIAGNOSIS — F05 Delirium due to known physiological condition: Secondary | ICD-10-CM | POA: Diagnosis not present

## 2015-10-25 DIAGNOSIS — Z7901 Long term (current) use of anticoagulants: Secondary | ICD-10-CM

## 2015-10-25 DIAGNOSIS — T8579XA Infection and inflammatory reaction due to other internal prosthetic devices, implants and grafts, initial encounter: Secondary | ICD-10-CM

## 2015-10-25 DIAGNOSIS — Z87891 Personal history of nicotine dependence: Secondary | ICD-10-CM

## 2015-10-25 DIAGNOSIS — Z880 Allergy status to penicillin: Secondary | ICD-10-CM

## 2015-10-25 DIAGNOSIS — R0602 Shortness of breath: Secondary | ICD-10-CM

## 2015-10-25 DIAGNOSIS — K219 Gastro-esophageal reflux disease without esophagitis: Secondary | ICD-10-CM | POA: Diagnosis present

## 2015-10-25 DIAGNOSIS — R1110 Vomiting, unspecified: Secondary | ICD-10-CM

## 2015-10-25 DIAGNOSIS — I5043 Acute on chronic combined systolic (congestive) and diastolic (congestive) heart failure: Secondary | ICD-10-CM | POA: Diagnosis present

## 2015-10-25 DIAGNOSIS — I2510 Atherosclerotic heart disease of native coronary artery without angina pectoris: Secondary | ICD-10-CM | POA: Diagnosis present

## 2015-10-25 DIAGNOSIS — I25110 Atherosclerotic heart disease of native coronary artery with unstable angina pectoris: Secondary | ICD-10-CM | POA: Diagnosis present

## 2015-10-25 DIAGNOSIS — J384 Edema of larynx: Secondary | ICD-10-CM | POA: Diagnosis not present

## 2015-10-25 DIAGNOSIS — E878 Other disorders of electrolyte and fluid balance, not elsewhere classified: Secondary | ICD-10-CM | POA: Diagnosis not present

## 2015-10-25 DIAGNOSIS — J9811 Atelectasis: Secondary | ICD-10-CM | POA: Diagnosis not present

## 2015-10-25 DIAGNOSIS — R061 Stridor: Secondary | ICD-10-CM | POA: Diagnosis not present

## 2015-10-25 DIAGNOSIS — E1140 Type 2 diabetes mellitus with diabetic neuropathy, unspecified: Secondary | ICD-10-CM | POA: Diagnosis present

## 2015-10-25 DIAGNOSIS — J9690 Respiratory failure, unspecified, unspecified whether with hypoxia or hypercapnia: Secondary | ICD-10-CM

## 2015-10-25 DIAGNOSIS — Z951 Presence of aortocoronary bypass graft: Secondary | ICD-10-CM

## 2015-10-25 DIAGNOSIS — E876 Hypokalemia: Secondary | ICD-10-CM | POA: Diagnosis not present

## 2015-10-25 DIAGNOSIS — I252 Old myocardial infarction: Secondary | ICD-10-CM | POA: Diagnosis present

## 2015-10-25 DIAGNOSIS — Z6836 Body mass index (BMI) 36.0-36.9, adult: Secondary | ICD-10-CM

## 2015-10-25 DIAGNOSIS — I482 Chronic atrial fibrillation: Secondary | ICD-10-CM | POA: Diagnosis present

## 2015-10-25 DIAGNOSIS — R7881 Bacteremia: Secondary | ICD-10-CM | POA: Diagnosis not present

## 2015-10-25 DIAGNOSIS — E669 Obesity, unspecified: Secondary | ICD-10-CM | POA: Diagnosis present

## 2015-10-25 DIAGNOSIS — E870 Hyperosmolality and hypernatremia: Secondary | ICD-10-CM | POA: Diagnosis not present

## 2015-10-25 DIAGNOSIS — J40 Bronchitis, not specified as acute or chronic: Secondary | ICD-10-CM

## 2015-10-25 DIAGNOSIS — E1143 Type 2 diabetes mellitus with diabetic autonomic (poly)neuropathy: Secondary | ICD-10-CM | POA: Diagnosis not present

## 2015-10-25 DIAGNOSIS — J9601 Acute respiratory failure with hypoxia: Secondary | ICD-10-CM | POA: Diagnosis not present

## 2015-10-25 DIAGNOSIS — Y95 Nosocomial condition: Secondary | ICD-10-CM | POA: Diagnosis not present

## 2015-10-25 DIAGNOSIS — Z833 Family history of diabetes mellitus: Secondary | ICD-10-CM

## 2015-10-25 HISTORY — DX: Atherosclerotic heart disease of native coronary artery without angina pectoris: I25.10

## 2015-10-25 HISTORY — DX: Other persistent atrial fibrillation: I48.19

## 2015-10-25 LAB — CBC WITH DIFFERENTIAL/PLATELET
BASOS ABS: 0.1 10*3/uL (ref 0.0–0.1)
Basophils Relative: 0 %
EOS PCT: 6 %
Eosinophils Absolute: 0.6 10*3/uL (ref 0.0–0.7)
HEMATOCRIT: 36.4 % — AB (ref 39.0–52.0)
HEMOGLOBIN: 12.2 g/dL — AB (ref 13.0–17.0)
LYMPHS ABS: 1.7 10*3/uL (ref 0.7–4.0)
LYMPHS PCT: 15 %
MCH: 31.6 pg (ref 26.0–34.0)
MCHC: 33.5 g/dL (ref 30.0–36.0)
MCV: 94.3 fL (ref 78.0–100.0)
Monocytes Absolute: 0.9 10*3/uL (ref 0.1–1.0)
Monocytes Relative: 8 %
NEUTROS ABS: 8 10*3/uL — AB (ref 1.7–7.7)
Neutrophils Relative %: 71 %
Platelets: 294 10*3/uL (ref 150–400)
RBC: 3.86 MIL/uL — AB (ref 4.22–5.81)
RDW: 13.8 % (ref 11.5–15.5)
WBC: 11.2 10*3/uL — AB (ref 4.0–10.5)

## 2015-10-25 LAB — BASIC METABOLIC PANEL
ANION GAP: 10 (ref 5–15)
BUN: 39 mg/dL — AB (ref 6–20)
CHLORIDE: 104 mmol/L (ref 101–111)
CO2: 23 mmol/L (ref 22–32)
Calcium: 9.2 mg/dL (ref 8.9–10.3)
Creatinine, Ser: 1.91 mg/dL — ABNORMAL HIGH (ref 0.61–1.24)
GFR calc Af Amer: 37 mL/min — ABNORMAL LOW (ref >60)
GFR, EST NON AFRICAN AMERICAN: 32 mL/min — AB (ref >60)
GLUCOSE: 183 mg/dL — AB (ref 65–99)
POTASSIUM: 4.1 mmol/L (ref 3.5–5.1)
Sodium: 137 mmol/L (ref 135–145)

## 2015-10-25 LAB — I-STAT TROPONIN, ED: Troponin i, poc: 0.24 ng/mL (ref 0.00–0.08)

## 2015-10-25 LAB — BRAIN NATRIURETIC PEPTIDE: B NATRIURETIC PEPTIDE 5: 207.1 pg/mL — AB (ref 0.0–100.0)

## 2015-10-25 MED ORDER — HEPARIN (PORCINE) IN NACL 100-0.45 UNIT/ML-% IJ SOLN
1200.0000 [IU]/h | INTRAMUSCULAR | Status: DC
  Filled 2015-10-25: qty 250

## 2015-10-25 MED ORDER — HEPARIN SODIUM (PORCINE) 5000 UNIT/ML IJ SOLN
4000.0000 [IU] | Freq: Once | INTRAMUSCULAR | Status: AC
  Administered 2015-10-25: 4000 [IU] via INTRAVENOUS

## 2015-10-25 MED ORDER — HEPARIN (PORCINE) IN NACL 100-0.45 UNIT/ML-% IJ SOLN
1250.0000 [IU]/h | INTRAMUSCULAR | Status: DC
  Administered 2015-10-25: 1050 [IU]/h via INTRAVENOUS
  Filled 2015-10-25: qty 250

## 2015-10-25 MED ORDER — METOPROLOL TARTRATE 5 MG/5ML IV SOLN
5.0000 mg | Freq: Once | INTRAVENOUS | Status: AC
  Administered 2015-10-25: 5 mg via INTRAVENOUS
  Filled 2015-10-25: qty 5

## 2015-10-25 MED ORDER — ASPIRIN 81 MG PO CHEW: 324.0000 mg | CHEWABLE_TABLET | Freq: Once | ORAL | Status: DC

## 2015-10-25 MED ORDER — SODIUM CHLORIDE 0.9 % IV BOLUS (SEPSIS)
1000.0000 mL | Freq: Once | INTRAVENOUS | Status: AC
  Administered 2015-10-25: 1000 mL via INTRAVENOUS

## 2015-10-25 NOTE — ED Notes (Signed)
Dr. Laverta Baltimore ( EDP ) notified on pt.'s elevated Troponin result.

## 2015-10-25 NOTE — H&P (Signed)
Physician History and Physical    Mark Rivers MRN: [id number] DOB/AGE: [birth year] 81 y.o. Admit date: 10/25/2015  Primary Cardiologist: Dr. Johnsie Cancel  HPI: 81 yo with history of CAD, CKD, and COPD came to ER with chest pain, found to have atrial fibrillation with RVR.  Patient had PTCA to RCA in 1988, no intervention since that time. Cardiolite in 10/16 showed no ischemia or infarction.  Over the last 3 wks, he had developed exertional chest pressure, noticing it usually when he would take the dogs out in the evening (chest pressure, relieved with rest.  He was seen in the office and given elevated creatinine, it was decided to do a Cardiolite rather than proceeding directly to cardiac cath.  He was scheduled for Cardiolite but had not had it done yet.   Today, while getting ready for bed, he developed severe substernal chest pressure at rest.  This lasted for about an hour and he called EMS.  He was noted to the in atrial fibrillation with RVR.  He had not felt palpitations (just dyspnea and chest pain).  In the ER, chest pain resolved after getting IV metoprolol.  HR slowed from 130s to 90s but he remained in atrial fibrillation.  Currently, he is chest pain-free.   PMH: 1. CAD: s/p PTCA to RCA in 1988. - Cardiolite 10/16: EF 51%, no ischemia/infarction.  2. CKD: stage III 3. Low back pain: Has tens unit. 4. COPD: Moderate by PFTs.  5. HTN 6. GERD  Review of systems complete and found to be negative unless listed above   Family History  Problem Relation Age of Onset  . Diabetes Father   . Colon cancer Neg Hx   . Esophageal cancer Neg Hx   . Stomach cancer Neg Hx   . Rectal cancer Neg Hx   . Allergic rhinitis Neg Hx   . Angioedema Neg Hx   . Asthma Neg Hx   . Eczema Neg Hx   . Immunodeficiency Neg Hx   . Urticaria Neg Hx     Social History   Social History  . Marital status: Married    Spouse name: N/A  . Number of children: N/A  . Years of education: N/A    Occupational History  . retired    Social History Main Topics  . Smoking status: Former Smoker    Packs/day: 2.00    Years: 30.00    Types: Cigarettes    Quit date: 06/28/2004  . Smokeless tobacco: Never Used  . Alcohol use No     Comment: QUIT 06/28/2004  . Drug use: No  . Sexual activity: Yes   Other Topics Concern  . Not on file   Social History Narrative  . No narrative on file      (Not in a hospital admission)  Physical Exam: Blood pressure 117/67, pulse (!) 47, temperature 98.3 F (36.8 C), temperature source Oral, resp. rate 21, height 5\' 7"  (1.702 m), weight 216 lb (98 kg), SpO2 92 %.  General: NAD Neck: JVP 8-9 cm, no thyromegaly or thyroid nodule.  Lungs: Clear to auscultation bilaterally with normal respiratory effort. CV: Nondisplaced PMI.  Heart irregular S1/S2, no S3/S4, 2/6 HSM apex.  1+ ankle edema.   Abdomen: Soft, nontender, no hepatosplenomegaly, mild distention.  Skin: Intact without lesions or rashes.  Neurologic: Alert and oriented x 3.  Psych: Normal affect. Extremities: No clubbing or cyanosis.  HEENT: Normal.   Labs:   Lab Results  Component Value Date  WBC 11.2 (H) 10/25/2015   HGB 12.2 (L) 10/25/2015   HCT 36.4 (L) 10/25/2015   MCV 94.3 10/25/2015   PLT 294 10/25/2015    Recent Labs Lab 10/25/15 2110  NA 137  K 4.1  CL 104  CO2 23  BUN 39*  CREATININE 1.91*  CALCIUM 9.2  GLUCOSE 183*  TnI 0.24, BNP 207   Radiology: - CXR: No active disease  EKG: Atrial fibrillation at 135, inferior 1 mm ST depression, anterolateral 2 mm ST depression  ASSESSMENT AND PLAN: 81 yo with history with history of CAD, CKD, and COPD came to ER with chest pain, found to have atrial fibrillation with RVR.  1. CAD: NSTEMI today.  He has had progressive exertional chest pain culminating in chest pain at rest for about an hour today. He is now chest pain free.  TnI 0.24.  ECG with inferior and anterolateral ST depression.  - Continue ASA and heparin gtt.  -  atorvastatin 80 mg daily.  - Will give metoprolol 25 mg every 6 hrs.  - Cycle troponin to peak.  - Echo - Coronary angiography today, being careful of contrast with CKD.  2. Atrial fibrillation: New onset, initially with RVR.  He was in NSR on 10/11.  He does not feel palpitations.  HR improved with IV metoprolol.  CHADSVASC = 4.  - metoprolol 25 mg every 6 hrs.  - He will need to be anticoagulated.  Probably will also need PCI.  Would favor antiplatelet regimen with Plavix + short-term ASA 81.  Would use Xarelto 15 mg daily eventually.  - If he remains in atrial fibrillation after 1 month (as long as he can be rate controlled, would be reasonable to consider DCCV).  If he is difficult to rate control or seems symptomatic from afib, can consider early TEE-guided DCCV, but would do cath 1st.  3. CKD: Stage III.  Creatinine 1.9 today, which appears to be his baseline.  On exam, he appears mildly volume overloaded.  Will very gently hydrate pre-cath.   4. COPD: Stable.   Signed: Loralie Champagne 10/25/2015, 11:28 PM

## 2015-10-25 NOTE — ED Triage Notes (Addendum)
Pt. arrived with EMS from home reports progressing central chest pressure/heaviness with SOB worse with exertion for several days  , received ASA 324 mg. By EMS prior to arrival , history of CAD/Coronary stent , his cardiologist is Dr. Johnsie Cancel.

## 2015-10-25 NOTE — Progress Notes (Signed)
ANTICOAGULATION CONSULT NOTE - Initial Consult  Pharmacy Consult for Heparin Indication: chest pain/ACS  Allergies  Allergen Reactions  . Amoxicillin Other (See Comments)    Passes out Passes out Passes out  . Cephalexin Other (See Comments)    Passes out Passes out Passes out  . Penicillin G Other (See Comments)    unknown unknown    Patient Measurements: Height: 5\' 7"  (170.2 cm) Weight: 216 lb (98 kg) IBW/kg (Calculated) : 66.1 Heparin Dosing Weight: 87.2  Vital Signs: Temp: 98.3 F (36.8 C) (10/16 2057) Temp Source: Oral (10/16 2057) BP: 120/91 (10/16 2130) Pulse Rate: 112 (10/16 2130)  Labs:  Recent Labs  10/25/15 2110  HGB 12.2*  HCT 36.4*  PLT 294  CREATININE 1.91*    Estimated Creatinine Clearance: 35 mL/min (by C-G formula based on SCr of 1.91 mg/dL (H)).   Medical History: Past Medical History:  Diagnosis Date  . Allergy   . Back pain   . Coronary artery disease   . Diabetes mellitus   . GERD (gastroesophageal reflux disease)   . Hypertension   . Neuropathy [facility])     Assessment: 81 yo male presenting with chest pain. No anticoagulants PTA. Pharmacy has been consulted to dose heparin.   Goal of Therapy:  Heparin level 0.3-0.7 units/ml Monitor platelets by anticoagulation protocol: Yes   Plan:  Give 4000 units bolus x 1, then  Heparin  1,050 units per hour 8 hour heparin level Daily BCB, heparin level   Gaege Sangalang L Quincy Boy 10/25/2015,10:31 PM

## 2015-10-25 NOTE — ED Provider Notes (Signed)
[place] DEPT Provider Note   CSN: [id number] Arrival date & time: 10/25/15  2048     History   Chief Complaint Chief Complaint  Patient presents with  . Chest Pressure  . Shortness of Breath    HPI Mark Rivers is a 81 y.o. male.   Chest Pain   This is a recurrent problem. The current episode started 2 days ago. The problem occurs hourly. The problem has been gradually worsening. The pain is associated with exertion. The pain is present in the substernal region. The quality of the pain is described as heavy and pressure-like. The pain does not radiate. The symptoms are aggravated by exertion. Associated symptoms include irregular heartbeat, lower extremity edema, orthopnea and shortness of breath. Pertinent negatives include no abdominal pain, no back pain, no cough, no diaphoresis, no fever, no nausea, no palpitations and no vomiting.  His past medical history is significant for CAD and CHF.  Pertinent negatives for past medical history include no seizures.  Procedure history is positive for cardiac catheterization (stent placement).    Past Medical History:  Diagnosis Date  . Allergy   . Back pain   . Coronary artery disease   . Diabetes mellitus   . GERD (gastroesophageal reflux disease)   . Hypertension   . Neuropathy [facility])     Patient Active Problem List   Diagnosis Date Noted  . Seasonal allergic conjunctivitis 08/25/2015  . History of tobacco abuse 08/25/2015  . Acute maxillary sinusitis 06/25/2015  . Diabetes mellitus ([place]) 03/11/2015  . Diabetes mellitus with stage 3 chronic kidney disease ([place]) 03/11/2015  . Metabolic bone disease 0000000  . Vitamin D deficiency 03/11/2015  . Atopic dermatitis 02/24/2015  . Hip pain 09/24/2014  . Need for immunization against influenza 09/24/2014  . Benign essential hypertension 03/13/2014  . Generalized osteoarthritis of multiple sites 03/13/2014  . CKD (chronic kidney disease) stage 3, GFR 30-59 ml/min  03/04/2014  . Acute stress disorder 11/12/2013  . Seasonal and perennial allergic rhinitis 11/12/2013  . Allergic urticaria 11/12/2013  . Benign neoplasm of colon 11/12/2013  . Causalgia of upper extremity 11/12/2013  . Cervical radiculopathy 11/12/2013  . Neck pain 11/12/2013  . Chronic fatigue 11/12/2013  . Dizziness 11/12/2013  . Hearing loss 11/12/2013  . Insomnia 11/12/2013  . Obesity 11/12/2013  . Premature ventricular contractions 11/12/2013  . Psychosexual dysfunction with inhibited sexual excitement 11/12/2013  . Hyperlipemia 09/11/2013  . Type 2 diabetes mellitus, uncontrolled ([place]) 09/11/2013  . Pulmonary emphysema ([place]) 04/22/2013  . Dyspnea 03/12/2013  . Chronic coronary artery disease 03/12/2013  . Esophageal reflux   . Benign hypertension with chronic kidney disease, stage III   . Allergy   . Neuropathy ([place])   . Back pain     Past Surgical History:  Procedure Laterality Date  . cataract surg     bil/2008  . COLONOSCOPY    . CORONARY STENT PLACEMENT     1988  . TONSILLECTOMY         Home Medications    Prior to Admission medications   Medication Sig Start Date End Date Taking? Authorizing Provider  amLODipine (NORVASC) 10 MG tablet TAKE 1 TABLET (10 MG TOTAL) BY MOUTH DAILY. 08/10/15   Josue Hector, MD  azelastine (ASTELIN) 0.1 % nasal spray Place 1-2 sprays into both nostrils 2 (two) times daily as needed for rhinitis. 08/25/15   Adelina Mings, MD  carvedilol (COREG) 3.125 MG tablet Take 1 tablet (3.125 mg total) by mouth  2 (two) times daily with a meal. 10/20/15   Isaiah Serge, NP  cholecalciferol (VITAMIN D) 1000 units tablet Take 1,000 Units by mouth daily.    Historical Provider, MD  cyanocobalamin ([employer] VITAMIN B12) 2000 MCG tablet Take 2,000 mcg by mouth daily.    Historical Provider, MD  diphenhydramine-acetaminophen (TYLENOL PM) 25-500 MG TABS tablet Take 2 tablets by mouth at bedtime as needed.    Historical Provider, MD  fenofibrate  54 MG tablet Take 54 mg by mouth daily.    Historical Provider, MD  furosemide (LASIX) 20 MG tablet TAKE 1 TABLET (20 MG TOTAL) BY MOUTH DAILY. 10/20/15   Josue Hector, MD  Glucosamine-Chondroit-Vit C-Mn (GLUCOSAMINE CHONDR 1500 COMPLX PO) Take 1 tablet by mouth daily.    Historical Provider, MD  hydrALAZINE (APRESOLINE) 50 MG tablet Take 75 mg by mouth 3 (three) times daily.    Historical Provider, MD  isosorbide mononitrate (IMDUR) 30 MG 24 hr tablet Take 1 tablet (30 mg total) by mouth daily. 10/20/15 01/18/16  Isaiah Serge, NP  levocetirizine (XYZAL) 5 MG tablet Take 0.5 tablets (2.5 mg total) by mouth every evening. 08/25/15   Adelina Mings, MD  losartan (COZAAR) 100 MG tablet Take 1 tablet (100 mg total) by mouth daily. 12/24/14   Josue Hector, MD  niacin (NIASPAN) 1000 MG CR tablet Take 1,000 mg by mouth at bedtime.    Historical Provider, MD  Olopatadine HCl (PATADAY) 0.2 % SOLN One drop each eye once a day for itchy eyes. 08/25/15   Adelina Mings, MD  omeprazole (PRILOSEC) 40 MG capsule Take 40 mg by mouth daily. 07/06/14   Historical Provider, MD  simvastatin (ZOCOR) 10 MG tablet Take 10 mg by mouth daily at 6 PM.     Historical Provider, MD  TOUJEO SOLOSTAR 300 UNIT/ML SOPN Inject 20 Units as directed at bedtime. 08/15/14   Historical Provider, MD  traZODone (DESYREL) 50 MG tablet Take 50 mg by mouth at bedtime.    Historical Provider, MD    Family History Family History  Problem Relation Age of Onset  . Diabetes Father   . Colon cancer Neg Hx   . Esophageal cancer Neg Hx   . Stomach cancer Neg Hx   . Rectal cancer Neg Hx   . Allergic rhinitis Neg Hx   . Angioedema Neg Hx   . Asthma Neg Hx   . Eczema Neg Hx   . Immunodeficiency Neg Hx   . Urticaria Neg Hx     Social History Social History  Substance Use Topics  . Smoking status: Former Smoker    Packs/day: 2.00    Years: 30.00    Types: Cigarettes    Quit date: 06/28/2004  . Smokeless tobacco: Never Used    . Alcohol use No     Comment: QUIT 06/28/2004     Allergies   Amoxicillin; Cephalexin; and Penicillin g   Review of Systems Review of Systems  Constitutional: Negative for chills, diaphoresis and fever.  HENT: Negative for ear pain and sore throat.   Eyes: Negative for pain and visual disturbance.  Respiratory: Positive for shortness of breath. Negative for cough.   Cardiovascular: Positive for chest pain and orthopnea. Negative for palpitations.  Gastrointestinal: Negative for abdominal pain, nausea and vomiting.  Genitourinary: Negative for dysuria and hematuria.  Musculoskeletal: Negative for arthralgias and back pain.  Skin: Negative for color change and rash.  Neurological: Negative for seizures and syncope.  All other  systems reviewed and are negative.    Physical Exam Updated Vital Signs BP 129/70 (BP Location: Left Arm)   Pulse (!) 122   Temp 98.3 F (36.8 C) (Oral)   Resp 16   Ht 5\' 7"  (1.702 m)   Wt 98 kg   SpO2 96%   BMI 33.83 kg/m   Physical Exam  Constitutional: He is oriented to person, place, and time. He appears well-developed and well-nourished.  HENT:  Head: Normocephalic and atraumatic.  Eyes: Conjunctivae and EOM are normal. Pupils are equal, round, and reactive to light.  Neck: Normal range of motion. Neck supple.  Cardiovascular:  Tachycardic, irregular rhythm  Pulmonary/Chest: Effort normal and breath sounds normal.  Abdominal: Soft. There is no tenderness.  Musculoskeletal: He exhibits no edema.  Neurological: He is alert and oriented to person, place, and time.  Skin: Skin is warm and dry.  Psychiatric: He has a normal mood and affect.  Nursing note and vitals reviewed.    ED Treatments / Results  Labs (all labs ordered are listed, but only abnormal results are displayed) Labs Reviewed  CBC WITH DIFFERENTIAL/PLATELET - Abnormal; Notable for the following:       Result Value   WBC 11.2 (*)    RBC 3.86 (*)    Hemoglobin 12.2 (*)     HCT 36.4 (*)    Neutro Abs 8.0 (*)    All other components within normal limits  BASIC METABOLIC PANEL - Abnormal; Notable for the following:    Glucose, Bld 183 (*)    BUN 39 (*)    Creatinine, Ser 1.91 (*)    GFR calc non Af Amer 32 (*)    GFR calc Af Amer 37 (*)    All other components within normal limits  BRAIN NATRIURETIC PEPTIDE - Abnormal; Notable for the following:    B Natriuretic Peptide 207.1 (*)    All other components within normal limits  HEPARIN LEVEL (UNFRACTIONATED) - Abnormal; Notable for the following:    Heparin Unfractionated 0.22 (*)    All other components within normal limits  CBC - Abnormal; Notable for the following:    WBC 11.1 (*)    RBC 3.71 (*)    Hemoglobin 11.6 (*)    HCT 35.0 (*)    All other components within normal limits  BRAIN NATRIURETIC PEPTIDE - Abnormal; Notable for the following:    B Natriuretic Peptide 225.2 (*)    All other components within normal limits  BASIC METABOLIC PANEL - Abnormal; Notable for the following:    Glucose, Bld 104 (*)    BUN 31 (*)    Creatinine, Ser 1.57 (*)    Calcium 8.5 (*)    GFR calc non Af Amer 40 (*)    GFR calc Af Amer 47 (*)    All other components within normal limits  TROPONIN I - Abnormal; Notable for the following:    Troponin I 0.79 (*)    All other components within normal limits  TROPONIN I - Abnormal; Notable for the following:    Troponin I 1.82 (*)    All other components within normal limits  GLUCOSE, CAPILLARY - Abnormal; Notable for the following:    Glucose-Capillary 135 (*)    All other components within normal limits  HEPARIN LEVEL (UNFRACTIONATED) - Abnormal; Notable for the following:    Heparin Unfractionated 0.19 (*)    All other components within normal limits  GLUCOSE, CAPILLARY - Abnormal; Notable for the following:  Glucose-Capillary 101 (*)    All other components within normal limits  I-STAT TROPOININ, ED - Abnormal; Notable for the following:    Troponin i,  poc 0.24 (*)    All other components within normal limits  TSH  PROTIME-INR  TROPONIN I  TROPONIN I    EKG  EKG Interpretation  Date/Time:  Monday October 25 2015 20:54:42 EDT Ventricular Rate:  135 PR Interval:    QRS Duration: 86 QT Interval:  278 QTC Calculation: 417 R Axis:   79 Text Interpretation:  Atrial fibrillation Repolarization abnormality, prob rate related ST depression but no clear STEMI. New a-fib compared to prior tracing.  Confirmed by LONG MD, JOSHUA [phone]) on 10/25/2015 8:57:44 PM       Radiology Dg Chest Portable 1 View  Result Date: 10/25/2015 CLINICAL DATA:  Chest pressure and shortness of breath for several days EXAM: PORTABLE CHEST 1 VIEW COMPARISON:  10/26/2008 FINDINGS: Cardiac shadow is at the upper limits of normal in size but stable. The lungs are well aerated bilaterally. No focal infiltrate or sizable effusion is seen no acute bony abnormality is noted. IMPRESSION: No active disease. Electronically Signed   By: Inez Catalina M.D.   On: 10/25/2015 21:53    Procedures Procedures (including critical care time)  Medications Ordered in ED Medications  heparin ADULT infusion 100 units/mL (25000 units/271mL sodium chloride 0.45%) (1,050 Units/hr Intravenous Transfusing/Transfer 10/26/15 0004)  amLODipine (NORVASC) tablet 10 mg (not administered)  azelastine (ASTELIN) 0.1 % nasal spray 1-2 spray (not administered)  fenofibrate tablet 54 mg (not administered)  furosemide (LASIX) tablet 20 mg (not administered)  hydrALAZINE (APRESOLINE) tablet 75 mg (not administered)  isosorbide mononitrate (IMDUR) 24 hr tablet 30 mg (not administered)  loratadine (CLARITIN) tablet 10 mg (not administered)  traZODone (DESYREL) tablet 100 mg (100 mg Oral Given 10/26/15 0121)  aspirin EC tablet 81 mg (not administered)  nitroGLYCERIN (NITROSTAT) SL tablet 0.4 mg (not administered)  acetaminophen (TYLENOL) tablet 650 mg (not administered)  ondansetron (ZOFRAN)  injection 4 mg (not administered)  sodium chloride flush (NS) 0.9 % injection 3 mL (3 mLs Intravenous Not Given 10/26/15 0122)  sodium chloride flush (NS) 0.9 % injection 3 mL (not administered)  0.9 %  sodium chloride infusion (not administered)  metoprolol tartrate (LOPRESSOR) tablet 25 mg (25 mg Oral Given 10/26/15 0559)  atorvastatin (LIPITOR) tablet 80 mg (not administered)  insulin aspart (novoLOG) injection 0-15 Units (0 Units Subcutaneous Not Given 10/26/15 0700)  insulin aspart (novoLOG) injection 0-5 Units (0 Units Subcutaneous Not Given 10/26/15 0114)  sodium chloride flush (NS) 0.9 % injection 3 mL (3 mLs Intravenous Given 10/26/15 0122)  sodium chloride flush (NS) 0.9 % injection 3 mL (not administered)  0.9 %  sodium chloride infusion (not administered)  0.9% sodium chloride infusion (3 mL/kg/hr  98 kg Intravenous New Bag/Given 10/26/15 0446)    Followed by  0.9% sodium chloride infusion (1 mL/kg/hr  98 kg Intravenous New Bag/Given 10/26/15 0600)  insulin glargine (LANTUS) injection 25 Units (25 Units Subcutaneous Not Given 10/26/15 0121)  metoprolol (LOPRESSOR) injection 5 mg (5 mg Intravenous Given 10/25/15 2118)  sodium chloride 0.9 % bolus 1,000 mL (0 mLs Intravenous Stopped 10/25/15 2220)  heparin injection 4,000 Units (4,000 Units Intravenous Bolus 10/25/15 2252)  aspirin chewable tablet 81 mg (81 mg Oral Given 10/26/15 0559)     Initial Impression / Assessment and Plan / ED Course  I have reviewed the triage vital signs and the nursing notes.  Pertinent labs &  imaging results that were available during my care of the patient were reviewed by me and considered in my medical decision making (see chart for details).  Clinical Course   Mr. Kosky is a 81 year old male with past medical history significant for CAD, diabetes, hypertension, CKD, A. fib and patient acknowledged CHF who presents for chest pain shortness of breath.  He is found to be in A. fib with RVR, and  his history concerning for ACS.  EKG obtained, demonstrates A. fib with RVR and ST depressions.  No clear STEMI.  Chest x-ray obtained, personally reviewed by me, demonstrates cardiomegaly with mild pulmonary edema.  Labs ordered including CBC, BMP, BNP, troponin.  Results significant for mildly elevated BNP, mild leukocytosis, elevated troponin.  Patient's A. fib with RVR was treated with metoprolol.  Rate responded well and patient symptoms resolved.  Patient's an STEMI treated with heparin.  Cardiology was consulted and will admit.   Final Clinical Impressions(s) / ED Diagnoses   Final diagnoses:  Atrial fibrillation with RVR ([place])  NSTEMI (non-ST elevated myocardial infarction) [facility])    New Prescriptions New Prescriptions   No medications on file     Elveria Rising, MD 10/26/15 [place], MD 10/26/15 1025

## 2015-10-26 ENCOUNTER — Encounter (HOSPITAL_COMMUNITY)
Admission: EM | Disposition: A | Discharge status: Nursing Home (Includes ICF) | Payer: Self-pay | Source: Home / Self Care | Attending: Cardiothoracic Surgery

## 2015-10-26 ENCOUNTER — Inpatient Hospital Stay (HOSPITAL_COMMUNITY): Payer: Medicare Other | Admitting: Certified Registered Nurse Anesthetist

## 2015-10-26 ENCOUNTER — Inpatient Hospital Stay (HOSPITAL_COMMUNITY): Payer: Medicare Other

## 2015-10-26 ENCOUNTER — Encounter (HOSPITAL_COMMUNITY): Payer: Self-pay | Admitting: Certified Registered Nurse Anesthetist

## 2015-10-26 DIAGNOSIS — N183 Chronic kidney disease, stage 3 (moderate): Secondary | ICD-10-CM

## 2015-10-26 DIAGNOSIS — J449 Chronic obstructive pulmonary disease, unspecified: Secondary | ICD-10-CM

## 2015-10-26 DIAGNOSIS — I252 Old myocardial infarction: Secondary | ICD-10-CM | POA: Diagnosis present

## 2015-10-26 DIAGNOSIS — I4891 Unspecified atrial fibrillation: Secondary | ICD-10-CM

## 2015-10-26 DIAGNOSIS — I2510 Atherosclerotic heart disease of native coronary artery without angina pectoris: Secondary | ICD-10-CM

## 2015-10-26 HISTORY — PX: CORONARY ARTERY BYPASS GRAFT: SHX141

## 2015-10-26 HISTORY — PX: CARDIAC CATHETERIZATION: SHX172

## 2015-10-26 HISTORY — PX: TEE WITHOUT CARDIOVERSION: SHX5443

## 2015-10-26 LAB — POCT I-STAT 3, ART BLOOD GAS (G3+)
ACID-BASE DEFICIT: 3 mmol/L — AB (ref 0.0–2.0)
Acid-base deficit: 1 mmol/L (ref 0.0–2.0)
Acid-base deficit: 4 mmol/L — ABNORMAL HIGH (ref 0.0–2.0)
Acid-base deficit: 5 mmol/L — ABNORMAL HIGH (ref 0.0–2.0)
BICARBONATE: 21.9 mmol/L (ref 20.0–28.0)
Bicarbonate: 24.5 mmol/L (ref 20.0–28.0)
Bicarbonate: 21.3 mmol/L (ref 20.0–28.0)
Bicarbonate: 23.7 mmol/L (ref 20.0–28.0)
O2 SAT: 100 %
O2 SAT: 95 %
O2 Saturation: 93 %
O2 Saturation: 99 %
PCO2 ART: 37.8 mmHg (ref 32.0–48.0)
PCO2 ART: 49 mmHg — AB (ref 32.0–48.0)
PCO2 ART: 48.6 mmHg — AB (ref 32.0–48.0)
PH ART: 7.336 — AB (ref 7.350–7.450)
PH ART: 7.258 — AB (ref 7.350–7.450)
PH ART: 7.359 (ref 7.350–7.450)
PO2 ART: 78 mmHg — AB (ref 83.0–108.0)
PO2 ART: 85 mmHg (ref 83.0–108.0)
Patient temperature: 37.8
TCO2: 26 mmol/L (ref 0–100)
TCO2: 22 mmol/L (ref 0–100)
TCO2: 23 mmol/L (ref 0–100)
TCO2: 25 mmol/L (ref 0–100)
pCO2 arterial: 45.8 mmHg (ref 32.0–48.0)
pH, Arterial: 7.3 — ABNORMAL LOW (ref 7.350–7.450)
pO2, Arterial: 426 mmHg — ABNORMAL HIGH (ref 83.0–108.0)
pO2, Arterial: 147 mmHg — ABNORMAL HIGH (ref 83.0–108.0)

## 2015-10-26 LAB — BASIC METABOLIC PANEL
ANION GAP: 6 (ref 5–15)
BUN: 31 mg/dL — AB (ref 6–20)
CALCIUM: 8.5 mg/dL — AB (ref 8.9–10.3)
CO2: 24 mmol/L (ref 22–32)
CREATININE: 1.57 mg/dL — AB (ref 0.61–1.24)
Chloride: 110 mmol/L (ref 101–111)
GFR calc Af Amer: 47 mL/min — ABNORMAL LOW (ref >60)
GFR, EST NON AFRICAN AMERICAN: 40 mL/min — AB (ref >60)
GLUCOSE: 104 mg/dL — AB (ref 65–99)
Potassium: 4.4 mmol/L (ref 3.5–5.1)
Sodium: 140 mmol/L (ref 135–145)

## 2015-10-26 LAB — POCT I-STAT, CHEM 8
BUN: 25 mg/dL — AB (ref 6–20)
BUN: 22 mg/dL — ABNORMAL HIGH (ref 6–20)
BUN: 26 mg/dL — ABNORMAL HIGH (ref 6–20)
BUN: 24 mg/dL — ABNORMAL HIGH (ref 6–20)
BUN: 25 mg/dL — ABNORMAL HIGH (ref 6–20)
BUN: 26 mg/dL — AB (ref 6–20)
CALCIUM ION: 1.16 mmol/L (ref 1.15–1.40)
CALCIUM ION: 0.93 mmol/L — AB (ref 1.15–1.40)
CALCIUM ION: 1.17 mmol/L (ref 1.15–1.40)
CALCIUM ION: 1.05 mmol/L — AB (ref 1.15–1.40)
CALCIUM ION: 1.07 mmol/L — AB (ref 1.15–1.40)
CALCIUM ION: 1.14 mmol/L — AB (ref 1.15–1.40)
CHLORIDE: 103 mmol/L (ref 101–111)
CHLORIDE: 101 mmol/L (ref 101–111)
CREATININE: 1.2 mg/dL (ref 0.61–1.24)
CREATININE: 1.2 mg/dL (ref 0.61–1.24)
CREATININE: 1.3 mg/dL — AB (ref 0.61–1.24)
Chloride: 107 mmol/L (ref 101–111)
Chloride: 107 mmol/L (ref 101–111)
Chloride: 107 mmol/L (ref 101–111)
Chloride: 104 mmol/L (ref 101–111)
Creatinine, Ser: 0.8 mg/dL (ref 0.61–1.24)
Creatinine, Ser: 1 mg/dL (ref 0.61–1.24)
Creatinine, Ser: 1.2 mg/dL (ref 0.61–1.24)
GLUCOSE: 96 mg/dL (ref 65–99)
GLUCOSE: 92 mg/dL (ref 65–99)
GLUCOSE: 94 mg/dL (ref 65–99)
GLUCOSE: 110 mg/dL — AB (ref 65–99)
Glucose, Bld: 139 mg/dL — ABNORMAL HIGH (ref 65–99)
Glucose, Bld: 204 mg/dL — ABNORMAL HIGH (ref 65–99)
HCT: 30 % — ABNORMAL LOW (ref 39.0–52.0)
HCT: 27 % — ABNORMAL LOW (ref 39.0–52.0)
HCT: 29 % — ABNORMAL LOW (ref 39.0–52.0)
HCT: 26 % — ABNORMAL LOW (ref 39.0–52.0)
HCT: 27 % — ABNORMAL LOW (ref 39.0–52.0)
HCT: 31 % — ABNORMAL LOW (ref 39.0–52.0)
HEMOGLOBIN: 10.2 g/dL — AB (ref 13.0–17.0)
HEMOGLOBIN: 9.9 g/dL — AB (ref 13.0–17.0)
HEMOGLOBIN: 8.8 g/dL — AB (ref 13.0–17.0)
Hemoglobin: 9.2 g/dL — ABNORMAL LOW (ref 13.0–17.0)
Hemoglobin: 9.2 g/dL — ABNORMAL LOW (ref 13.0–17.0)
Hemoglobin: 10.5 g/dL — ABNORMAL LOW (ref 13.0–17.0)
POTASSIUM: 4.1 mmol/L (ref 3.5–5.1)
POTASSIUM: 5 mmol/L (ref 3.5–5.1)
Potassium: 4 mmol/L (ref 3.5–5.1)
Potassium: 4.5 mmol/L (ref 3.5–5.1)
Potassium: 5.5 mmol/L — ABNORMAL HIGH (ref 3.5–5.1)
Potassium: 3.7 mmol/L (ref 3.5–5.1)
SODIUM: 139 mmol/L (ref 135–145)
SODIUM: 138 mmol/L (ref 135–145)
SODIUM: 142 mmol/L (ref 135–145)
Sodium: 143 mmol/L (ref 135–145)
Sodium: 142 mmol/L (ref 135–145)
Sodium: 142 mmol/L (ref 135–145)
TCO2: 25 mmol/L (ref 0–100)
TCO2: 24 mmol/L (ref 0–100)
TCO2: 26 mmol/L (ref 0–100)
TCO2: 28 mmol/L (ref 0–100)
TCO2: 24 mmol/L (ref 0–100)
TCO2: 26 mmol/L (ref 0–100)

## 2015-10-26 LAB — HEMOGLOBIN AND HEMATOCRIT, BLOOD
HCT: 26.4 % — ABNORMAL LOW (ref 39.0–52.0)
Hemoglobin: 8.8 g/dL — ABNORMAL LOW (ref 13.0–17.0)

## 2015-10-26 LAB — CBC
HCT: 35 % — ABNORMAL LOW (ref 39.0–52.0)
Hemoglobin: 11.6 g/dL — ABNORMAL LOW (ref 13.0–17.0)
MCH: 31.3 pg (ref 26.0–34.0)
MCHC: 33.1 g/dL (ref 30.0–36.0)
MCV: 94.3 fL (ref 78.0–100.0)
PLATELETS: 264 10*3/uL (ref 150–400)
RBC: 3.71 MIL/uL — AB (ref 4.22–5.81)
RDW: 13.7 % (ref 11.5–15.5)
WBC: 11.1 10*3/uL — AB (ref 4.0–10.5)

## 2015-10-26 LAB — PREPARE RBC (CROSSMATCH)

## 2015-10-26 LAB — ABO/RH: ABO/RH(D): O POS

## 2015-10-26 LAB — GLUCOSE, CAPILLARY
GLUCOSE-CAPILLARY: 101 mg/dL — AB (ref 65–99)
GLUCOSE-CAPILLARY: 135 mg/dL — AB (ref 65–99)
GLUCOSE-CAPILLARY: 82 mg/dL (ref 65–99)
GLUCOSE-CAPILLARY: 91 mg/dL (ref 65–99)

## 2015-10-26 LAB — PROTIME-INR
INR: 1.12
PROTHROMBIN TIME: 14.5 s (ref 11.4–15.2)

## 2015-10-26 LAB — BRAIN NATRIURETIC PEPTIDE: B NATRIURETIC PEPTIDE 5: 225.2 pg/mL — AB (ref 0.0–100.0)

## 2015-10-26 LAB — TROPONIN I
Troponin I: 0.79 ng/mL (ref <0.03)
Troponin I: 1.82 ng/mL (ref <0.03)
Troponin I: 2.11 ng/mL (ref <0.03)

## 2015-10-26 LAB — HEPARIN LEVEL (UNFRACTIONATED)
HEPARIN UNFRACTIONATED: 0.22 [IU]/mL — AB (ref 0.30–0.70)
HEPARIN UNFRACTIONATED: 0.19 [IU]/mL — AB (ref 0.30–0.70)

## 2015-10-26 LAB — PLATELET COUNT: Platelets: 203 10*3/uL (ref 150–400)

## 2015-10-26 LAB — TSH: TSH: 3.494 u[IU]/mL (ref 0.350–4.500)

## 2015-10-26 SURGERY — CORONARY ARTERY BYPASS GRAFTING (CABG)
Anesthesia: General | Site: Chest

## 2015-10-26 SURGERY — LEFT HEART CATH AND CORONARY ANGIOGRAPHY

## 2015-10-26 MED ORDER — HEPARIN SODIUM (PORCINE) 1000 UNIT/ML IJ SOLN
INTRAMUSCULAR | Status: DC | PRN
  Administered 2015-10-26: 5000 [IU] via INTRAVENOUS

## 2015-10-26 MED ORDER — FENTANYL CITRATE (PF) 100 MCG/2ML IJ SOLN
INTRAMUSCULAR | Status: AC
  Filled 2015-10-26: qty 2

## 2015-10-26 MED ORDER — SODIUM CHLORIDE 0.9 % IV SOLN: 250.0000 mL | INTRAVENOUS | Status: DC | PRN

## 2015-10-26 MED ORDER — GELATIN ABSORBABLE MT POWD
OROMUCOSAL | Status: DC | PRN
  Administered 2015-10-26: 18:00:00 via TOPICAL

## 2015-10-26 MED ORDER — FUROSEMIDE 20 MG PO TABS: 20.0000 mg | ORAL_TABLET | Freq: Every day | ORAL | Status: DC

## 2015-10-26 MED ORDER — NITROGLYCERIN IN D5W 200-5 MCG/ML-% IV SOLN
2.0000 ug/min | INTRAVENOUS | Status: DC
  Filled 2015-10-26: qty 250

## 2015-10-26 MED ORDER — METOPROLOL TARTRATE 25 MG PO TABS
25.0000 mg | ORAL_TABLET | Freq: Four times a day (QID) | ORAL | Status: DC
  Administered 2015-10-26 (×3): 25 mg via ORAL
  Filled 2015-10-26 (×3): qty 1

## 2015-10-26 MED ORDER — CHLORHEXIDINE GLUCONATE CLOTH 2 % EX PADS: 6.0000 | MEDICATED_PAD | Freq: Once | CUTANEOUS | Status: DC

## 2015-10-26 MED ORDER — TRANEXAMIC ACID (OHS) PUMP PRIME SOLUTION
2.0000 mg/kg | INTRAVENOUS | Status: DC
  Filled 2015-10-26: qty 2

## 2015-10-26 MED ORDER — ASPIRIN 81 MG PO CHEW
81.0000 mg | CHEWABLE_TABLET | ORAL | Status: AC
  Administered 2015-10-26: 81 mg via ORAL
  Filled 2015-10-26: qty 1

## 2015-10-26 MED ORDER — TEMAZEPAM 15 MG PO CAPS
15.0000 mg | ORAL_CAPSULE | Freq: Once | ORAL | Status: DC | PRN
Start: 2015-10-26 — End: 2015-10-26

## 2015-10-26 MED ORDER — PHENYLEPHRINE HCL 10 MG/ML IJ SOLN
INTRAVENOUS | Status: DC | PRN
  Administered 2015-10-26: 20 ug/min via INTRAVENOUS

## 2015-10-26 MED ORDER — ASPIRIN EC 325 MG PO TBEC: 325.0000 mg | DELAYED_RELEASE_TABLET | Freq: Every day | ORAL | Status: DC

## 2015-10-26 MED ORDER — NITROGLYCERIN IN D5W 200-5 MCG/ML-% IV SOLN
INTRAVENOUS | Status: AC
  Filled 2015-10-26: qty 250

## 2015-10-26 MED ORDER — SUCCINYLCHOLINE CHLORIDE 200 MG/10ML IV SOSY
PREFILLED_SYRINGE | INTRAVENOUS | Status: AC
  Filled 2015-10-26: qty 10

## 2015-10-26 MED ORDER — MIDAZOLAM HCL 2 MG/2ML IJ SOLN
INTRAMUSCULAR | Status: DC | PRN
  Administered 2015-10-26: 2 mg via INTRAVENOUS

## 2015-10-26 MED ORDER — EPHEDRINE SULFATE 50 MG/ML IJ SOLN
INTRAMUSCULAR | Status: DC | PRN
  Administered 2015-10-26: 5 mg via INTRAVENOUS
  Administered 2015-10-26: 15 mg via INTRAVENOUS
  Administered 2015-10-26: 10 mg via INTRAVENOUS
  Administered 2015-10-26: 5 mg via INTRAVENOUS

## 2015-10-26 MED ORDER — SODIUM CHLORIDE 0.9 % IV SOLN
INTRAVENOUS | Status: DC
  Filled 2015-10-26: qty 30

## 2015-10-26 MED ORDER — IOPAMIDOL (ISOVUE-370) INJECTION 76%
INTRAVENOUS | Status: DC | PRN
  Administered 2015-10-26: 75 mL via INTRA_ARTERIAL

## 2015-10-26 MED ORDER — SODIUM CHLORIDE 0.9 % IV SOLN
20.0000 ug | INTRAVENOUS | Status: AC
  Filled 2015-10-26: qty 5

## 2015-10-26 MED ORDER — HEPARIN (PORCINE) IN NACL 2-0.9 UNIT/ML-% IJ SOLN
INTRAMUSCULAR | Status: DC | PRN
  Administered 2015-10-26: 10 mL via INTRA_ARTERIAL

## 2015-10-26 MED ORDER — NITROGLYCERIN 0.4 MG SL SUBL: 0.4000 mg | SUBLINGUAL_TABLET | SUBLINGUAL | Status: DC | PRN

## 2015-10-26 MED ORDER — LACTATED RINGERS IV SOLN
INTRAVENOUS | Status: DC | PRN
  Administered 2015-10-26 (×2): via INTRAVENOUS

## 2015-10-26 MED ORDER — PLASMA-LYTE 148 IV SOLN
INTRAVENOUS | Status: AC
  Administered 2015-10-26: 500 mL
  Filled 2015-10-26: qty 2.5

## 2015-10-26 MED ORDER — SODIUM CHLORIDE 0.9 % IJ SOLN
OROMUCOSAL | Status: DC | PRN
  Administered 2015-10-26: 18:00:00 via TOPICAL

## 2015-10-26 MED ORDER — POTASSIUM CHLORIDE 2 MEQ/ML IV SOLN
80.0000 meq | INTRAVENOUS | Status: DC
  Filled 2015-10-26: qty 40

## 2015-10-26 MED ORDER — AZELASTINE HCL 0.1 % NA SOLN
1.0000 | Freq: Two times a day (BID) | NASAL | Status: DC | PRN
  Filled 2015-10-26: qty 30

## 2015-10-26 MED ORDER — CHLORHEXIDINE GLUCONATE 0.12 % MT SOLN
15.0000 mL | Freq: Once | OROMUCOSAL | Status: DC
  Filled 2015-10-26: qty 15

## 2015-10-26 MED ORDER — PROTAMINE SULFATE 10 MG/ML IV SOLN
INTRAVENOUS | Status: AC
  Filled 2015-10-26: qty 25

## 2015-10-26 MED ORDER — INSULIN GLARGINE 100 UNIT/ML ~~LOC~~ SOLN
25.0000 [IU] | Freq: Every day | SUBCUTANEOUS | Status: DC
  Filled 2015-10-26 (×2): qty 0.25

## 2015-10-26 MED ORDER — INSULIN GLARGINE 300 UNIT/ML ~~LOC~~ SOPN: 30.0000 [IU] | PEN_INJECTOR | Freq: Every day | SUBCUTANEOUS | Status: DC

## 2015-10-26 MED ORDER — HYDRALAZINE HCL 50 MG PO TABS
75.0000 mg | ORAL_TABLET | Freq: Three times a day (TID) | ORAL | Status: DC
  Administered 2015-10-26: 75 mg via ORAL
  Filled 2015-10-26: qty 1

## 2015-10-26 MED ORDER — ATORVASTATIN CALCIUM 80 MG PO TABS
80.0000 mg | ORAL_TABLET | Freq: Every day | ORAL | Status: DC
  Administered 2015-10-28 – 2015-11-26 (×27): 80 mg via ORAL
  Filled 2015-10-26 (×27): qty 1

## 2015-10-26 MED ORDER — ROCURONIUM BROMIDE 10 MG/ML (PF) SYRINGE
PREFILLED_SYRINGE | INTRAVENOUS | Status: AC
  Filled 2015-10-26: qty 30

## 2015-10-26 MED ORDER — LIDOCAINE HCL (PF) 1 % IJ SOLN
INTRAMUSCULAR | Status: DC | PRN
  Administered 2015-10-26: 2 mL

## 2015-10-26 MED ORDER — LACTATED RINGERS IV SOLN
INTRAVENOUS | Status: DC | PRN
  Administered 2015-10-26 (×2): via INTRAVENOUS

## 2015-10-26 MED ORDER — SODIUM CHLORIDE 0.9 % WEIGHT BASED INFUSION
1.0000 mL/kg/h | INTRAVENOUS | Status: DC
  Administered 2015-10-26 (×2): 1 mL/kg/h via INTRAVENOUS

## 2015-10-26 MED ORDER — SODIUM CHLORIDE 0.9% FLUSH: 3.0000 mL | Freq: Two times a day (BID) | INTRAVENOUS | Status: DC

## 2015-10-26 MED ORDER — MIDAZOLAM HCL 5 MG/5ML IJ SOLN
INTRAMUSCULAR | Status: DC | PRN
  Administered 2015-10-26: 2 mg via INTRAVENOUS

## 2015-10-26 MED ORDER — HEPARIN (PORCINE) IN NACL 2-0.9 UNIT/ML-% IJ SOLN
INTRAMUSCULAR | Status: DC | PRN
  Administered 2015-10-26: 1000 mL

## 2015-10-26 MED ORDER — SODIUM BICARBONATE 8.4 % IV SOLN
INTRAVENOUS | Status: DC | PRN
  Administered 2015-10-26 (×2): 50 meq via INTRAVENOUS

## 2015-10-26 MED ORDER — MAGNESIUM SULFATE 50 % IJ SOLN
40.0000 meq | INTRAMUSCULAR | Status: DC
  Filled 2015-10-26: qty 10

## 2015-10-26 MED ORDER — PHENYLEPHRINE HCL 10 MG/ML IJ SOLN
30.0000 ug/min | INTRAVENOUS | Status: DC
  Filled 2015-10-26: qty 2

## 2015-10-26 MED ORDER — SODIUM CHLORIDE 0.9% FLUSH: 3.0000 mL | INTRAVENOUS | Status: DC | PRN

## 2015-10-26 MED ORDER — AMLODIPINE BESYLATE 10 MG PO TABS
10.0000 mg | ORAL_TABLET | Freq: Every day | ORAL | Status: DC
  Administered 2015-10-26: 10 mg via ORAL
  Filled 2015-10-26: qty 1

## 2015-10-26 MED ORDER — PROPOFOL 10 MG/ML IV BOLUS
INTRAVENOUS | Status: DC | PRN
  Administered 2015-10-26: 30 mg via INTRAVENOUS

## 2015-10-26 MED ORDER — NITROPRUSSIDE SODIUM 25 MG/ML IV SOLN: INTRAVENOUS | Status: DC | PRN

## 2015-10-26 MED ORDER — ASPIRIN EC 81 MG PO TBEC
81.0000 mg | DELAYED_RELEASE_TABLET | Freq: Every day | ORAL | Status: DC
  Filled 2015-10-26: qty 1

## 2015-10-26 MED ORDER — ROCURONIUM BROMIDE 100 MG/10ML IV SOLN
INTRAVENOUS | Status: DC | PRN
  Administered 2015-10-26: 30 mg via INTRAVENOUS
  Administered 2015-10-26: 100 mg via INTRAVENOUS
  Administered 2015-10-26: 50 mg via INTRAVENOUS
  Administered 2015-10-26: 30 mg via INTRAVENOUS
  Administered 2015-10-26: 50 mg via INTRAVENOUS
  Administered 2015-10-26: 30 mg via INTRAVENOUS

## 2015-10-26 MED ORDER — FENOFIBRATE 54 MG PO TABS
54.0000 mg | ORAL_TABLET | Freq: Every day | ORAL | Status: DC
Start: 2015-10-26 — End: 2015-10-30
  Administered 2015-10-26 – 2015-10-30 (×2): 54 mg via ORAL
  Filled 2015-10-26 (×2): qty 1

## 2015-10-26 MED ORDER — LIDOCAINE HCL (PF) 1 % IJ SOLN
INTRAMUSCULAR | Status: AC
  Filled 2015-10-26: qty 30

## 2015-10-26 MED ORDER — FUROSEMIDE 10 MG/ML IJ SOLN
INTRAMUSCULAR | Status: DC | PRN
  Administered 2015-10-26: 20 mg via INTRAMUSCULAR

## 2015-10-26 MED ORDER — HEPARIN (PORCINE) IN NACL 2-0.9 UNIT/ML-% IJ SOLN
INTRAMUSCULAR | Status: AC
  Filled 2015-10-26: qty 1000

## 2015-10-26 MED ORDER — NITROGLYCERIN IN D5W 200-5 MCG/ML-% IV SOLN
INTRAVENOUS | Status: DC | PRN
  Administered 2015-10-26: 5 ug/min via INTRAVENOUS

## 2015-10-26 MED ORDER — VERAPAMIL HCL 2.5 MG/ML IV SOLN
INTRAVENOUS | Status: AC
  Filled 2015-10-26: qty 2

## 2015-10-26 MED ORDER — HEMOSTATIC AGENTS (NO CHARGE) OPTIME
TOPICAL | Status: DC | PRN
  Administered 2015-10-26 (×3): 1 via TOPICAL

## 2015-10-26 MED ORDER — LORATADINE 10 MG PO TABS: 10.0000 mg | ORAL_TABLET | Freq: Every evening | ORAL | Status: DC

## 2015-10-26 MED ORDER — EPINEPHRINE PF 1 MG/ML IJ SOLN
0.0000 ug/min | INTRAVENOUS | Status: AC
  Administered 2015-10-26: 3 ug/min via INTRAVENOUS
  Filled 2015-10-26: qty 4

## 2015-10-26 MED ORDER — IOPAMIDOL (ISOVUE-370) INJECTION 76%
INTRAVENOUS | Status: AC
  Filled 2015-10-26: qty 100

## 2015-10-26 MED ORDER — PROPOFOL 10 MG/ML IV BOLUS
INTRAVENOUS | Status: AC
  Filled 2015-10-26: qty 40

## 2015-10-26 MED ORDER — SODIUM CHLORIDE 0.9 % IV SOLN
INTRAVENOUS | Status: DC | PRN
  Administered 2015-10-26: 1 [IU]/h via INTRAVENOUS

## 2015-10-26 MED ORDER — METOPROLOL TARTRATE 12.5 MG HALF TABLET: 12.5000 mg | ORAL_TABLET | Freq: Once | ORAL | Status: DC

## 2015-10-26 MED ORDER — NOREPINEPHRINE BITARTRATE 1 MG/ML IV SOLN
0.0000 ug/min | INTRAVENOUS | Status: DC
  Administered 2015-10-27: 20 ug/min via INTRAVENOUS
  Administered 2015-10-27 (×2): 15 ug/min via INTRAVENOUS
  Administered 2015-10-28: 6 ug/min via INTRAVENOUS
  Administered 2015-10-28: 8 ug/min via INTRAVENOUS
  Administered 2015-10-30: 4 ug/min via INTRAVENOUS
  Filled 2015-10-26 (×10): qty 4

## 2015-10-26 MED ORDER — FENTANYL CITRATE (PF) 250 MCG/5ML IJ SOLN
INTRAMUSCULAR | Status: DC | PRN
  Administered 2015-10-26: 250 ug via INTRAVENOUS
  Administered 2015-10-26 (×2): 100 ug via INTRAVENOUS
  Administered 2015-10-26: 150 ug via INTRAVENOUS
  Administered 2015-10-26: 500 ug via INTRAVENOUS

## 2015-10-26 MED ORDER — LEVOFLOXACIN IN D5W 500 MG/100ML IV SOLN
500.0000 mg | INTRAVENOUS | Status: AC
  Administered 2015-10-26: 500 mg via INTRAVENOUS
  Filled 2015-10-26: qty 100

## 2015-10-26 MED ORDER — SODIUM CHLORIDE 0.9 % IV SOLN
INTRAVENOUS | Status: DC
  Filled 2015-10-26: qty 2.5

## 2015-10-26 MED ORDER — SODIUM CHLORIDE 0.9 % IV SOLN
INTRAVENOUS | Status: DC | PRN
  Administered 2015-10-26: 0.2 ug/kg/h via INTRAVENOUS

## 2015-10-26 MED ORDER — TRANEXAMIC ACID 1000 MG/10ML IV SOLN
1.5000 mg/kg/h | INTRAVENOUS | Status: AC
  Administered 2015-10-26: 1.5 mg/kg/h via INTRAVENOUS
  Filled 2015-10-26: qty 25

## 2015-10-26 MED ORDER — MIDAZOLAM HCL 10 MG/2ML IJ SOLN
INTRAMUSCULAR | Status: AC
  Filled 2015-10-26: qty 2

## 2015-10-26 MED ORDER — ALBUMIN HUMAN 5 % IV SOLN
INTRAVENOUS | Status: DC | PRN
  Administered 2015-10-26 (×3): via INTRAVENOUS

## 2015-10-26 MED ORDER — SODIUM CHLORIDE 0.9 % IJ SOLN
INTRAMUSCULAR | Status: DC | PRN
  Administered 2015-10-26: 22:00:00 via TOPICAL

## 2015-10-26 MED ORDER — TRANEXAMIC ACID (OHS) BOLUS VIA INFUSION
15.0000 mg/kg | INTRAVENOUS | Status: AC
  Administered 2015-10-26: 1497 mg via INTRAVENOUS
  Filled 2015-10-26: qty 1497

## 2015-10-26 MED ORDER — PROTAMINE SULFATE 10 MG/ML IV SOLN
INTRAVENOUS | Status: DC | PRN
  Administered 2015-10-26: 50 mg via INTRAVENOUS
  Administered 2015-10-26: 150 mg via INTRAVENOUS
  Administered 2015-10-26 (×2): 50 mg via INTRAVENOUS

## 2015-10-26 MED ORDER — SODIUM CHLORIDE 0.9 % IV SOLN
1500.0000 mg | INTRAVENOUS | Status: AC
  Administered 2015-10-26: 1500 mg via INTRAVENOUS
  Filled 2015-10-26: qty 1500

## 2015-10-26 MED ORDER — SODIUM CHLORIDE 0.9 % IJ SOLN
OROMUCOSAL | Status: DC | PRN
  Administered 2015-10-26: 22:00:00 via TOPICAL

## 2015-10-26 MED ORDER — PHENYLEPHRINE 40 MCG/ML (10ML) SYRINGE FOR IV PUSH (FOR BLOOD PRESSURE SUPPORT)
PREFILLED_SYRINGE | INTRAVENOUS | Status: AC
  Filled 2015-10-26: qty 10

## 2015-10-26 MED ORDER — ACETAMINOPHEN 325 MG PO TABS: 650.0000 mg | ORAL_TABLET | ORAL | Status: DC | PRN

## 2015-10-26 MED ORDER — AMIODARONE HCL IN DEXTROSE 360-4.14 MG/200ML-% IV SOLN
60.0000 mg/h | INTRAVENOUS | Status: AC
Start: 2015-10-26 — End: 2015-10-27
  Filled 2015-10-26: qty 200

## 2015-10-26 MED ORDER — CALCIUM CHLORIDE 10 % IV SOLN
INTRAVENOUS | Status: DC | PRN
  Administered 2015-10-26: 1 g via INTRAVENOUS

## 2015-10-26 MED ORDER — SODIUM CHLORIDE 0.9% FLUSH
3.0000 mL | Freq: Two times a day (BID) | INTRAVENOUS | Status: DC
  Administered 2015-10-26 (×2): 3 mL via INTRAVENOUS

## 2015-10-26 MED ORDER — SODIUM CHLORIDE 0.9 % IV SOLN
INTRAVENOUS | Status: DC | PRN
  Administered 2015-10-26: 125 mL/h via INTRAVENOUS

## 2015-10-26 MED ORDER — MIDAZOLAM HCL 2 MG/2ML IJ SOLN
INTRAMUSCULAR | Status: AC
  Filled 2015-10-26: qty 2

## 2015-10-26 MED ORDER — HEPARIN SODIUM (PORCINE) 1000 UNIT/ML IJ SOLN
INTRAMUSCULAR | Status: AC
  Filled 2015-10-26: qty 1

## 2015-10-26 MED ORDER — MILRINONE LACTATE IN DEXTROSE 20-5 MG/100ML-% IV SOLN
0.1000 ug/kg/min | INTRAVENOUS | Status: DC
  Administered 2015-10-26: .5 mg/kg/min via INTRAVENOUS
  Administered 2015-10-27 – 2015-10-29 (×6): 0.3 ug/kg/min via INTRAVENOUS
  Administered 2015-10-30: 0.2 ug/kg/min via INTRAVENOUS
  Administered 2015-10-30: 0.3 ug/kg/min via INTRAVENOUS
  Administered 2015-10-31: 0.1 ug/kg/min via INTRAVENOUS
  Administered 2015-10-31: 0.2 ug/kg/min via INTRAVENOUS
  Filled 2015-10-26 (×11): qty 100

## 2015-10-26 MED ORDER — DEXMEDETOMIDINE HCL IN NACL 400 MCG/100ML IV SOLN
0.1000 ug/kg/h | INTRAVENOUS | Status: DC
  Filled 2015-10-26: qty 100

## 2015-10-26 MED ORDER — ISOSORBIDE MONONITRATE ER 30 MG PO TB24
30.0000 mg | ORAL_TABLET | Freq: Every day | ORAL | Status: DC
  Administered 2015-10-26: 30 mg via ORAL
  Filled 2015-10-26: qty 1

## 2015-10-26 MED ORDER — SODIUM CHLORIDE 0.9 % WEIGHT BASED INFUSION
3.0000 mL/kg/h | INTRAVENOUS | Status: DC
  Administered 2015-10-26: 3 mL/kg/h via INTRAVENOUS

## 2015-10-26 MED ORDER — INSULIN ASPART 100 UNIT/ML ~~LOC~~ SOLN: 0.0000 [IU] | Freq: Every day | SUBCUTANEOUS | Status: DC

## 2015-10-26 MED ORDER — HEPARIN SODIUM (PORCINE) 1000 UNIT/ML IJ SOLN
INTRAMUSCULAR | Status: AC
  Filled 2015-10-26: qty 3

## 2015-10-26 MED ORDER — BISACODYL 5 MG PO TBEC
5.0000 mg | DELAYED_RELEASE_TABLET | Freq: Once | ORAL | Status: DC
  Filled 2015-10-26: qty 1

## 2015-10-26 MED ORDER — LACTATED RINGERS IV SOLN
INTRAVENOUS | Status: DC | PRN
  Administered 2015-10-26: 17:00:00 via INTRAVENOUS

## 2015-10-26 MED ORDER — FENTANYL CITRATE (PF) 100 MCG/2ML IJ SOLN
INTRAMUSCULAR | Status: DC | PRN
  Administered 2015-10-26 (×2): 25 ug via INTRAVENOUS

## 2015-10-26 MED ORDER — HEPARIN SODIUM (PORCINE) 1000 UNIT/ML IJ SOLN
INTRAMUSCULAR | Status: DC | PRN
  Administered 2015-10-26: 29 mL via INTRAVENOUS
  Administered 2015-10-26: 2 mL via INTRAVENOUS

## 2015-10-26 MED ORDER — GELATIN ABSORBABLE MT POWD
OROMUCOSAL | Status: DC | PRN
  Administered 2015-10-26: 22:00:00 via TOPICAL

## 2015-10-26 MED ORDER — FENTANYL CITRATE (PF) 250 MCG/5ML IJ SOLN
INTRAMUSCULAR | Status: AC
  Filled 2015-10-26: qty 5

## 2015-10-26 MED ORDER — TRAZODONE HCL 100 MG PO TABS
100.0000 mg | ORAL_TABLET | Freq: Every day | ORAL | Status: DC
  Administered 2015-10-26: 100 mg via ORAL
  Filled 2015-10-26: qty 1

## 2015-10-26 MED ORDER — LIDOCAINE HCL (CARDIAC) 20 MG/ML IV SOLN
INTRAVENOUS | Status: DC | PRN
  Administered 2015-10-26: 100 mg via INTRAVENOUS

## 2015-10-26 MED ORDER — SODIUM CHLORIDE 0.9 % IV SOLN
INTRAVENOUS | Status: DC | PRN
  Administered 2015-10-26: 20 ug via INTRAVENOUS

## 2015-10-26 MED ORDER — FUROSEMIDE 10 MG/ML IJ SOLN
INTRAMUSCULAR | Status: AC
  Filled 2015-10-26: qty 4

## 2015-10-26 MED ORDER — EPHEDRINE 5 MG/ML INJ
INTRAVENOUS | Status: AC
  Filled 2015-10-26: qty 10

## 2015-10-26 MED ORDER — ASPIRIN 81 MG PO CHEW: 324.0000 mg | CHEWABLE_TABLET | ORAL | Status: DC

## 2015-10-26 MED ORDER — POTASSIUM CHLORIDE 10 MEQ/100ML IV SOLN
10.0000 meq | INTRAVENOUS | Status: AC
  Filled 2015-10-26 (×2): qty 100

## 2015-10-26 MED ORDER — DOPAMINE-DEXTROSE 3.2-5 MG/ML-% IV SOLN
0.0000 ug/kg/min | INTRAVENOUS | Status: AC
  Administered 2015-10-26: 3 ug/kg/min via INTRAVENOUS
  Filled 2015-10-26: qty 250

## 2015-10-26 MED ORDER — FENTANYL CITRATE (PF) 250 MCG/5ML IJ SOLN
INTRAMUSCULAR | Status: AC
  Filled 2015-10-26: qty 20

## 2015-10-26 MED ORDER — HEPARIN (PORCINE) IN NACL 100-0.45 UNIT/ML-% IJ SOLN
INTRAMUSCULAR | Status: DC | PRN
  Administered 2015-10-26: 1000 [IU]/h via INTRAVENOUS

## 2015-10-26 MED ORDER — AMIODARONE HCL IN DEXTROSE 360-4.14 MG/200ML-% IV SOLN
30.0000 mg/h | INTRAVENOUS | Status: DC
Start: 2015-10-27 — End: 2015-11-05
  Administered 2015-10-26: 60 mg/h via INTRAVENOUS
  Administered 2015-10-27 – 2015-11-04 (×19): 30 mg/h via INTRAVENOUS
  Filled 2015-10-26 (×20): qty 200

## 2015-10-26 MED ORDER — INSULIN ASPART 100 UNIT/ML ~~LOC~~ SOLN: 0.0000 [IU] | Freq: Three times a day (TID) | SUBCUTANEOUS | Status: DC

## 2015-10-26 MED ORDER — ASPIRIN 300 MG RE SUPP: 300.0000 mg | RECTAL | Status: DC

## 2015-10-26 MED ORDER — LIDOCAINE 2% (20 MG/ML) 5 ML SYRINGE
INTRAMUSCULAR | Status: AC
  Filled 2015-10-26: qty 5

## 2015-10-26 MED ORDER — ONDANSETRON HCL 4 MG/2ML IJ SOLN: 4.0000 mg | Freq: Four times a day (QID) | INTRAMUSCULAR | Status: DC | PRN

## 2015-10-26 MED ORDER — EPINEPHRINE PF 1 MG/ML IJ SOLN
INTRAMUSCULAR | Status: DC | PRN
Start: 2015-10-26 — End: 2015-10-27
  Administered 2015-10-26: 1 mg via INTRAVENOUS

## 2015-10-26 MED ORDER — BISACODYL 5 MG PO TBEC: 5.0000 mg | DELAYED_RELEASE_TABLET | Freq: Once | ORAL | Status: DC

## 2015-10-26 MED ORDER — 0.9 % SODIUM CHLORIDE (POUR BTL) OPTIME
TOPICAL | Status: DC | PRN
  Administered 2015-10-26: 1000 mL
  Administered 2015-10-26: 6000 mL

## 2015-10-26 MED ORDER — METOPROLOL TARTRATE 12.5 MG HALF TABLET
12.5000 mg | ORAL_TABLET | Freq: Once | ORAL | Status: DC
  Filled 2015-10-26: qty 1

## 2015-10-26 MED ORDER — AMIODARONE LOAD VIA INFUSION
150.0000 mg | Freq: Once | INTRAVENOUS | Status: AC
  Administered 2015-10-26: 150 mg via INTRAVENOUS
  Filled 2015-10-26: qty 83.34

## 2015-10-26 MED ORDER — SODIUM CHLORIDE 0.9 % IV SOLN
INTRAVENOUS | Status: DC | PRN
  Administered 2015-10-26: 22:00:00 via INTRAVENOUS

## 2015-10-26 SURGICAL SUPPLY — 112 items
ADAPTER CARDIO PERF ANTE/RETRO (ADAPTER) ×5 IMPLANT
ADH SKN CLS APL DERMABOND .7 (GAUZE/BANDAGES/DRESSINGS) ×3
ADPR PRFSN 84XANTGRD RTRGD (ADAPTER) ×3
BAG DECANTER FOR FLEXI CONT (MISCELLANEOUS) ×5 IMPLANT
BALLN LINEAR 7.5FR IABP 40CC (BALLOONS) ×5
BALLOON LINEAR 7.5FR IABP 40CC (BALLOONS) ×3 IMPLANT
BANDAGE ACE 4X5 VEL STRL LF (GAUZE/BANDAGES/DRESSINGS) ×5 IMPLANT
BANDAGE ACE 6X5 VEL STRL LF (GAUZE/BANDAGES/DRESSINGS) ×5 IMPLANT
BASKET HEART  (ORDER IN 25'S) (MISCELLANEOUS) ×1
BASKET HEART (ORDER IN 25'S) (MISCELLANEOUS) ×1
BASKET HEART (ORDER IN 25S) (MISCELLANEOUS) ×3 IMPLANT
BLADE STERNUM SYSTEM 6 (BLADE) ×5 IMPLANT
BLADE SURG 11 STRL SS (BLADE) ×5 IMPLANT
BLADE SURG 12 STRL SS (BLADE) ×5 IMPLANT
BLADE SURG ROTATE 9660 (MISCELLANEOUS) ×10 IMPLANT
BNDG GAUZE ELAST 4 BULKY (GAUZE/BANDAGES/DRESSINGS) ×5 IMPLANT
CANISTER SUCTION 2500CC (MISCELLANEOUS) ×5 IMPLANT
CANNULA GUNDRY RCSP 15FR (MISCELLANEOUS) ×5 IMPLANT
CATH CPB KIT VANTRIGT (MISCELLANEOUS) ×5 IMPLANT
CATH ROBINSON RED A/P 18FR (CATHETERS) ×15 IMPLANT
CATH THORACIC 36FR RT ANG (CATHETERS) ×5 IMPLANT
CLIP RETRACTION 3.0MM CORONARY (MISCELLANEOUS) ×5 IMPLANT
CLIP TI WIDE RED SMALL 24 (CLIP) ×5 IMPLANT
CRADLE DONUT ADULT HEAD (MISCELLANEOUS) ×5 IMPLANT
DERMABOND ADVANCED (GAUZE/BANDAGES/DRESSINGS) ×2
DERMABOND ADVANCED .7 DNX12 (GAUZE/BANDAGES/DRESSINGS) ×3 IMPLANT
DRAIN CHANNEL 32F RND 10.7 FF (WOUND CARE) ×5 IMPLANT
DRAPE CARDIOVASCULAR INCISE (DRAPES) ×4
DRAPE SLUSH/WARMER DISC (DRAPES) ×5 IMPLANT
DRAPE SRG 135X102X78XABS (DRAPES) ×3 IMPLANT
DRSG AQUACEL AG ADV 3.5X14 (GAUZE/BANDAGES/DRESSINGS) ×5 IMPLANT
ELECT BLADE 4.0 EZ CLEAN MEGAD (MISCELLANEOUS) ×5
ELECT BLADE 6.5 EXT (BLADE) ×5 IMPLANT
ELECT CAUTERY BLADE 6.4 (BLADE) ×5 IMPLANT
ELECT REM PT RETURN 9FT ADLT (ELECTROSURGICAL) ×10
ELECTRODE BLDE 4.0 EZ CLN MEGD (MISCELLANEOUS) ×3 IMPLANT
ELECTRODE REM PT RTRN 9FT ADLT (ELECTROSURGICAL) ×6 IMPLANT
FELT TEFLON 1X6 (MISCELLANEOUS) ×5 IMPLANT
GAUZE SPONGE 4X4 12PLY STRL (GAUZE/BANDAGES/DRESSINGS) ×10 IMPLANT
GLOVE BIO SURGEON STRL SZ7.5 (GLOVE) ×15 IMPLANT
GLOVE BIOGEL M 6.5 STRL (GLOVE) ×10 IMPLANT
GLOVE BIOGEL M STER SZ 6 (GLOVE) ×20 IMPLANT
GLOVE BIOGEL PI IND STRL 6 (GLOVE) ×6 IMPLANT
GLOVE BIOGEL PI IND STRL 6.5 (GLOVE) ×6 IMPLANT
GLOVE BIOGEL PI IND STRL 7.0 (GLOVE) ×6 IMPLANT
GLOVE BIOGEL PI INDICATOR 6 (GLOVE) ×4
GLOVE BIOGEL PI INDICATOR 6.5 (GLOVE) ×4
GLOVE BIOGEL PI INDICATOR 7.0 (GLOVE) ×4
GOWN STRL REUS W/ TWL LRG LVL3 (GOWN DISPOSABLE) ×24 IMPLANT
GOWN STRL REUS W/TWL LRG LVL3 (GOWN DISPOSABLE) ×40
HEMOSTAT POWDER SURGIFOAM 1G (HEMOSTASIS) ×15 IMPLANT
HEMOSTAT SURGICEL 2X14 (HEMOSTASIS) ×5 IMPLANT
INSERT FOGARTY XLG (MISCELLANEOUS) IMPLANT
KIT BASIN OR (CUSTOM PROCEDURE TRAY) ×5 IMPLANT
KIT ROOM TURNOVER OR (KITS) ×5 IMPLANT
KIT SUCTION CATH 14FR (SUCTIONS) ×5 IMPLANT
KIT VASOVIEW HEMOPRO VH 3000 (KITS) ×5 IMPLANT
LEAD PACING MYOCARDI (MISCELLANEOUS) ×5 IMPLANT
MARKER GRAFT CORONARY BYPASS (MISCELLANEOUS) ×15 IMPLANT
NS IRRIG 1000ML POUR BTL (IV SOLUTION) ×30 IMPLANT
PACK OPEN HEART (CUSTOM PROCEDURE TRAY) ×5 IMPLANT
PAD ARMBOARD 7.5X6 YLW CONV (MISCELLANEOUS) ×10 IMPLANT
PAD ELECT DEFIB RADIOL ZOLL (MISCELLANEOUS) ×5 IMPLANT
PENCIL BUTTON HOLSTER BLD 10FT (ELECTRODE) ×5 IMPLANT
PUNCH AORTIC ROTATE  4.5MM 8IN (MISCELLANEOUS) ×5 IMPLANT
PUNCH AORTIC ROTATE 4.0MM (MISCELLANEOUS) IMPLANT
PUNCH AORTIC ROTATE 4.5MM 8IN (MISCELLANEOUS) IMPLANT
PUNCH AORTIC ROTATE 5MM 8IN (MISCELLANEOUS) IMPLANT
SET CARDIOPLEGIA MPS 5001102 (MISCELLANEOUS) ×5 IMPLANT
SPONGE LAP 18X18 X RAY DECT (DISPOSABLE) ×10 IMPLANT
SPONGE LAP 4X18 X RAY DECT (DISPOSABLE) ×5 IMPLANT
STOPCOCK 4 WAY LG BORE MALE ST (IV SETS) ×5 IMPLANT
SURGIFLO W/THROMBIN 8M KIT (HEMOSTASIS) ×5 IMPLANT
SUT BONE WAX W31G (SUTURE) ×5 IMPLANT
SUT ETHIBOND 2 0 SH (SUTURE) ×15
SUT ETHIBOND 2 0 SH 36X2 (SUTURE) ×9 IMPLANT
SUT MNCRL AB 4-0 PS2 18 (SUTURE) ×5 IMPLANT
SUT PROLENE 3 0 SH DA (SUTURE) IMPLANT
SUT PROLENE 3 0 SH1 36 (SUTURE) IMPLANT
SUT PROLENE 4 0 RB 1 (SUTURE) ×4
SUT PROLENE 4 0 SH DA (SUTURE) ×10 IMPLANT
SUT PROLENE 4-0 RB1 .5 CRCL 36 (SUTURE) ×3 IMPLANT
SUT PROLENE 5 0 C 1 36 (SUTURE) IMPLANT
SUT PROLENE 6 0 C 1 30 (SUTURE) ×5 IMPLANT
SUT PROLENE 6 0 CC (SUTURE) ×30 IMPLANT
SUT PROLENE 8 0 BV175 6 (SUTURE) IMPLANT
SUT PROLENE BLUE 7 0 (SUTURE) ×10 IMPLANT
SUT SILK  1 MH (SUTURE)
SUT SILK 1 MH (SUTURE) IMPLANT
SUT SILK 2 0 SH CR/8 (SUTURE) ×15 IMPLANT
SUT SILK 3 0 SH CR/8 (SUTURE) IMPLANT
SUT STEEL 6MS V (SUTURE) ×5 IMPLANT
SUT STEEL SZ 6 DBL 3X14 BALL (SUTURE) ×10 IMPLANT
SUT VIC AB 1 CTX 36 (SUTURE) ×6
SUT VIC AB 1 CTX36XBRD ANBCTR (SUTURE) ×6 IMPLANT
SUT VIC AB 2-0 CT1 27 (SUTURE) ×5
SUT VIC AB 2-0 CT1 TAPERPNT 27 (SUTURE) ×3 IMPLANT
SUT VIC AB 2-0 CTX 27 (SUTURE) IMPLANT
SUT VIC AB 3-0 X1 27 (SUTURE) IMPLANT
SUTURE E-PAK OPEN HEART (SUTURE) ×5 IMPLANT
SYSTEM SAHARA CHEST DRAIN ATS (WOUND CARE) ×5 IMPLANT
TAPE CLOTH SURG 4X10 WHT LF (GAUZE/BANDAGES/DRESSINGS) ×5 IMPLANT
TAPE PAPER 2X10 WHT MICROPORE (GAUZE/BANDAGES/DRESSINGS) ×5 IMPLANT
TOWEL OR 17X24 6PK STRL BLUE (TOWEL DISPOSABLE) ×10 IMPLANT
TOWEL OR 17X26 10 PK STRL BLUE (TOWEL DISPOSABLE) ×10 IMPLANT
TRAY CATH LUMEN 1 20CM STRL (SET/KITS/TRAYS/PACK) ×10 IMPLANT
TRAY FOLEY IC TEMP SENS 16FR (CATHETERS) ×5 IMPLANT
TUBING ART PRESS 48 MALE/FEM (TUBING) ×10 IMPLANT
TUBING INSUFFLATION (TUBING) ×5 IMPLANT
UNDERPAD 30X30 (UNDERPADS AND DIAPERS) ×5 IMPLANT
WATER STERILE IRR 1000ML POUR (IV SOLUTION) ×10 IMPLANT
YANKAUER SUCT BULB TIP NO VENT (SUCTIONS) ×10 IMPLANT

## 2015-10-26 SURGICAL SUPPLY — 12 items
CATH 5FR JL4 DIAGNOSTIC (CATHETERS) ×3 IMPLANT
CATH INFINITI 5FR ANG PIGTAIL (CATHETERS) ×3 IMPLANT
CATH OPTITORQUE TIG 4.0 5F (CATHETERS) ×3 IMPLANT
DEVICE RAD COMP TR BAND LRG (VASCULAR PRODUCTS) ×3 IMPLANT
GLIDESHEATH SLEND SS 6F .021 (SHEATH) ×3 IMPLANT
KIT HEART LEFT (KITS) ×3 IMPLANT
PACK CARDIAC CATHETERIZATION (CUSTOM PROCEDURE TRAY) ×3 IMPLANT
SYR MEDRAD MARK V 150ML (SYRINGE) ×3 IMPLANT
TRANSDUCER W/STOPCOCK (MISCELLANEOUS) ×3 IMPLANT
TUBING CIL FLEX 10 FLL-RA (TUBING) ×3 IMPLANT
WIRE HI TORQ VERSACORE-J 145CM (WIRE) ×3 IMPLANT
WIRE SAFE-T 1.5MM-J .035X260CM (WIRE) ×3 IMPLANT

## 2015-10-26 NOTE — Progress Notes (Signed)
CRITICAL VALUE ALERT  Critical value received:  0.79 troponin  Date of notification:  10/26/15  Time of notification:  2:34  Critical value read back:Yes.    Nurse who received alert:  Doree Albee  MD notified (1st page): Cardiology, [place]  Time of first page:  2:37am   Time MD responded: no further orders at this time

## 2015-10-26 NOTE — Progress Notes (Signed)
[place] for Heparin Indication: chest pain/ACS  Allergies  Allergen Reactions  . Amoxicillin Other (See Comments)    PATIENT PASSES OUT!!!!  . Cephalexin Other (See Comments)    PATIENT PASSES OUT!!!!  . Penicillin G Other (See Comments)    Has patient had a PCN reaction causing immediate rash, facial/tongue/throat swelling, SOB or lightheadedness with hypotension: Yes Has patient had a PCN reaction causing severe rash involving mucus membranes or skin necrosis: No Has patient had a PCN reaction that required hospitalization: Yes Has patient had a PCN reaction occurring within the last 10 years: Yes If all of the above answers are "NO", then may proceed with Cephalosporin use. PATIENT PASSES OUT!!!!    Patient Measurements: Height: 5\' 7"  (170.2 cm) Weight: 220 lb (99.8 kg) IBW/kg (Calculated) : 66.1 Heparin Dosing Weight: 87.2  Vital Signs: Temp: 97.8 F (36.6 C) (10/17 0434) Temp Source: Oral (10/17 0434) BP: 131/69 (10/17 0559) Pulse Rate: 70 (10/17 0559)  Labs:  Recent Labs  10/25/15 2110 10/26/15 0137 10/26/15 0605  HGB 12.2*  --  11.6*  HCT 36.4*  --  35.0*  PLT 294  --  264  LABPROT  --  14.5  --   INR  --  1.12  --   HEPARINUNFRC  --  0.22* 0.19*  CREATININE 1.91*  --  1.57*  TROPONINI  --  0.79* 1.82*    Estimated Creatinine Clearance: 43 mL/min (by C-G formula based on SCr of 1.57 mg/dL (H)).   Medical History: Past Medical History:  Diagnosis Date  . Allergy   . Back pain   . Coronary artery disease   . Diabetes mellitus   . GERD (gastroesophageal reflux disease)   . Hypertension   . Neuropathy [facility])     Assessment: 81 yo male presenting with chest pain. No anticoagulants PTA. Pharmacy has been consulted to dose heparin.  Initial HL = 0.19 Cath planned for today  Goal of Therapy:  Heparin level 0.3-0.7 units/ml Monitor platelets by anticoagulation protocol: Yes   Plan:  Heparin to 1250 units  / hr Follow up after cath  Thank you Anette Guarneri, PharmD [phone]  10/26/2015,9:00 AM

## 2015-10-26 NOTE — Anesthesia Preprocedure Evaluation (Addendum)
Anesthesia Evaluation  Patient identified by MRN, date of birth, ID band Patient awake    Reviewed: Allergy & Precautions, H&P , NPO status , Patient's Chart, lab work & pertinent test results, reviewed documented beta blocker date and time   Airway Mallampati: III  TM Distance: >3 FB Neck ROM: Full    Dental no notable dental hx. (+) Teeth Intact   Pulmonary COPD, former smoker,    Pulmonary exam normal breath sounds clear to auscultation       Cardiovascular hypertension, Pt. on medications and Pt. on home beta blockers + CAD and + Past MI  + dysrhythmias Atrial Fibrillation  Rhythm:Irregular Rate:Normal     Neuro/Psych negative neurological ROS     GI/Hepatic Neg liver ROS, GERD  Controlled,  Endo/Other  diabetes  Renal/GU Renal InsufficiencyRenal disease  negative genitourinary   Musculoskeletal  (+) Arthritis , Osteoarthritis,    Abdominal   Peds  Hematology negative hematology ROS (+)   Anesthesia Other Findings   Reproductive/Obstetrics negative OB ROS                            Anesthesia Physical Anesthesia Plan  ASA: IV and emergent  Anesthesia Plan: General   Post-op Pain Management:    Induction: Intravenous  Airway Management Planned: Oral ETT  Additional Equipment: Arterial line, CVP, PA Cath, Ultrasound Guidance Line Placement and TEE  Intra-op Plan:   Post-operative Plan: Post-operative intubation/ventilation  Informed Consent: I have reviewed the patients History and Physical, chart, labs and discussed the procedure including the risks, benefits and alternatives for the proposed anesthesia with the patient or authorized representative who has indicated his/her understanding and acceptance.   Dental advisory given  Plan Discussed with: CRNA  Anesthesia Plan Comments:        Anesthesia Quick Evaluation

## 2015-10-26 NOTE — Progress Notes (Signed)
  Echocardiogram Echocardiogram Transesophageal has been performed.  Mark Rivers 10/26/2015, 6:04 PM

## 2015-10-26 NOTE — Progress Notes (Signed)
The patient was examined and preop studies reviewed. There has been no change from the prior exam and the patient is ready for surgery.  I've examined the patient in the cath lab and reviewed the coronary arteriogram images in the cath lab with the patient's cardiologist Dr. Ellouise Newer. The patient is being prepared for emergent multivessel CABG for his 99% left main stenosis. He understands the risks of surgery to include stroke MI death renal failure and infection. He understands that surgery will provide him best long-term therapy. He understands that the alternatives to surgery would result in only very short survival.

## 2015-10-26 NOTE — Brief Op Note (Addendum)
10/25/2015 - 10/26/2015  8:47 PM  PATIENT:  Mark Rivers  81 y.o. male  PRE-OPERATIVE DIAGNOSIS:  1. S/p NSTEMI 2.CAD (severe left main disease included)  POST-OPERATIVE DIAGNOSIS:  1. S/p NSTEMI 2.CAD (severe left main disease included)  PROCEDURE: TRANSESOPHAGEAL ECHOCARDIOGRAM (TEE),  EMERGENT CORONARY ARTERY BYPASS GRAFTING (CABG) x3 (LIMA to LA, SVG to OM1, SVG to PDA) with EVH from the left thigh and partial lower leg greater saphenous vein and left internal mammary artery, and IABP insertion   SURGEON:  Surgeon(s) and Role:    * Ivin Poot, MD - Primary  PHYSICIAN ASSISTANT: Lars Pinks PA-C  ANESTHESIA:   general  EBL:  Total I/O In: -  Out: 400 [Urine:400]  BLOOD ADMINISTERED:Two FFP  DRAINS: Chest tubes placed in the mediastinal and pleural spaces   COUNTS CORRECT:  YES  DICTATION: .Dragon Dictation  PLAN OF CARE: Admit to inpatient   PATIENT DISPOSITION:  ICU - intubated and hemodynamically stable.   Delay start of Pharmacological VTE agent (>24hrs) due to surgical blood loss or risk of bleeding: yes  BASELINE WEIGHT: 99.8 kg

## 2015-10-26 NOTE — Progress Notes (Signed)
Patient Name: Mark Rivers Date of Encounter: 10/26/2015  Primary Cardiologist: Dr. Debbe Odea Problem List     Active Problems:   Atrial fibrillation with RVR [facility])   NSTEMI (non-ST elevated myocardial infarction) ([place])     Subjective   No complaints, awaiting heart cath. On add on board, likely this afternoon   Inpatient Medications    Scheduled Meds: . amLODipine  10 mg Oral Daily  . aspirin EC  81 mg Oral Daily  . atorvastatin  80 mg Oral q1800  . fenofibrate  54 mg Oral Daily  . furosemide  20 mg Oral Daily  . hydrALAZINE  75 mg Oral TID  . insulin aspart  0-15 Units Subcutaneous TID WC  . insulin aspart  0-5 Units Subcutaneous QHS  . insulin glargine  25 Units Subcutaneous QHS  . isosorbide mononitrate  30 mg Oral Daily  . loratadine  10 mg Oral QPM  . metoprolol tartrate  25 mg Oral Q6H  . sodium chloride flush  3 mL Intravenous Q12H  . sodium chloride flush  3 mL Intravenous Q12H  . traZODone  100 mg Oral QHS   Continuous Infusions: . sodium chloride 1 mL/kg/hr (10/26/15 0926)  . heparin 1,250 Units/hr (10/26/15 0924)   PRN Meds: sodium chloride, sodium chloride, acetaminophen, azelastine, nitroGLYCERIN, ondansetron (ZOFRAN) IV, sodium chloride flush, sodium chloride flush   Vital Signs    Vitals:   10/26/15 0040 10/26/15 0434 10/26/15 0500 10/26/15 0559  BP: 121/69 128/66  131/69  Pulse: 80 70  70  Resp: 18 18    Temp: 98.6 F (37 C) 97.8 F (36.6 C)    TempSrc: Oral Oral    SpO2: 94% 95%    Weight: 219 lb 1.6 oz (99.4 kg)  220 lb (99.8 kg)   Height:        Intake/Output Summary (Last 24 hours) at 10/26/15 0932 Last data filed at 10/26/15 0800  Gross per 24 hour  Intake             1000 ml  Output              625 ml  Net              375 ml   Filed Weights   10/25/15 2058 10/26/15 0040 10/26/15 0500  Weight: 216 lb (98 kg) 219 lb 1.6 oz (99.4 kg) 220 lb (99.8 kg)    Physical Exam   GEN: Well nourished, well developed, in  no acute distress.  HEENT: Grossly normal.  Neck: Supple, no JVD, carotid bruits, or masses. Cardiac: RRR, no murmurs, rubs, or gallops. No clubbing, cyanosis, edema.  Radials/DP/PT 2+ and equal bilaterally.  Respiratory:  Respirations regular and unlabored, clear to auscultation bilaterally. GI: Soft, nontender, nondistended, BS + x 4. MS: no deformity or atrophy. Skin: warm and dry, no rash. Neuro:  Strength and sensation are intact. Psych: AAOx3.  Normal affect.  Labs    CBC  Recent Labs  10/25/15 2110 10/26/15 0605  WBC 11.2* 11.1*  NEUTROABS 8.0*  --   HGB 12.2* 11.6*  HCT 36.4* 35.0*  MCV 94.3 94.3  PLT 294 [id number]   Basic Metabolic Panel  Recent Labs  10/25/15 2110 10/26/15 0605  NA 137 140  K 4.1 4.4  CL 104 110  CO2 23 24  GLUCOSE 183* 104*  BUN 39* 31*  CREATININE 1.91* 1.57*  CALCIUM 9.2 8.5*   Liver Function Tests No results for input(s): AST, ALT,  ALKPHOS, BILITOT, PROT, ALBUMIN in the last 72 hours. No results for input(s): LIPASE, AMYLASE in the last 72 hours. Cardiac Enzymes  Recent Labs  10/26/15 0137 10/26/15 0605  TROPONINI 0.79* 1.82*   BNP Invalid input(s): POCBNP D-Dimer No results for input(s): DDIMER in the last 72 hours. Hemoglobin A1C No results for input(s): HGBA1C in the last 72 hours. Fasting Lipid Panel No results for input(s): CHOL, HDL, LDLCALC, TRIG, CHOLHDL, LDLDIRECT in the last 72 hours. Thyroid Function Tests  Recent Labs  10/26/15 0137  TSH 3.494    Telemetry    afib with CVR - Personally Reviewed  ECG    afib with CVR, HR 74, PVC- Personally Reviewed  Radiology    Dg Chest Portable 1 View  Result Date: 10/25/2015 CLINICAL DATA:  Chest pressure and shortness of breath for several days EXAM: PORTABLE CHEST 1 VIEW COMPARISON:  10/26/2008 FINDINGS: Cardiac shadow is at the upper limits of normal in size but stable. The lungs are well aerated bilaterally. No focal infiltrate or sizable effusion is seen no  acute bony abnormality is noted. IMPRESSION: No active disease. Electronically Signed   By: Inez Catalina M.D.   On: 10/25/2015 21:53    Cardiac Studies    Study Highlights   The left ventricular ejection fraction is mildly decreased (45-54%).  Nuclear stress EF: 51%.  There was no ST segment deviation noted during stress.  This is a low risk study.  No evidence of ischemia or scar.   No segmental wall motion abnormalities.     Patient Profile     81 yo with history of CAD s/p PCTA to RCA 1988, CKD, HTN and COPD came to ER with chest pain, found to have new onset atrial fibrillation with RVR and NSTEMI.   Assessment & Plan    1. CAD: NSTEMI. Troponin 0.79-> 1.82.  He has had progressive exertional chest pain culminating in chest pain at rest for about an hour yesterday. He is now chest pain free.   ECG with inferior and anterolateral ST depression.  - Continue ASA and heparin gtt, atorvastatin 80 mg daily and metoprolol 25 mg every 6 hrs.  - cath planned for today with Dr. Claiborne Billings   2. Atrial fibrillation: New onset, initially with RVR.  He was in NSR on 10/11.  He does not feel palpitations.  HR improved with IV metoprolol.  CHADSVASC = 4.  - metoprolol 25 mg every 6 hrs.  - He will need to be anticoagulated.  Probably will also need PCI.  Per Dr. Aundra Dubin, would favor antiplatelet regimen with Plavix + short-term ASA 81.  Would use Xarelto 15 mg daily eventually.  - If he remains in atrial fibrillation after 1 month (as long as he can be rate controlled, would be reasonable to consider DCCV).  If he is difficult to rate control or seems symptomatic from afib, can consider early TEE-guided DCCV, but would do cath 1st.   3. CKD: Stage III.  Creatinine 1.9 today, which appears to be his baseline.  On exam, he appears mildly volume overloaded.  Gently hydrated pre-cath.    4. COPD: Stable.   Signed, Angelena Form, PA-C  10/26/2015, 9:32 AM

## 2015-10-26 NOTE — Anesthesia Procedure Notes (Signed)
Procedure Name: Intubation Date/Time: 10/26/2015 5:34 PM Performed by: Ollen Bowl Pre-anesthesia Checklist: Patient identified, Emergency Drugs available, Suction available, Patient being monitored and Timeout performed Patient Re-evaluated:Patient Re-evaluated prior to inductionOxygen Delivery Method: Circle system utilized and Simple face mask Preoxygenation: Pre-oxygenation with 100% oxygen Intubation Type: IV induction Ventilation: Mask ventilation without difficulty and Oral airway inserted - appropriate to patient size Laryngoscope Size: Miller and 3 Grade View: Grade I Tube type: Subglottic suction tube Tube size: 8.0 mm Number of attempts: 1 Airway Equipment and Method: Patient positioned with wedge pillow and Stylet Placement Confirmation: ETT inserted through vocal cords under direct vision,  positive ETCO2 and breath sounds checked- equal and bilateral Secured at: 23 cm Tube secured with: Tape Dental Injury: Teeth and Oropharynx as per pre-operative assessment

## 2015-10-26 NOTE — ED Notes (Signed)
Transported to [facility] in stable condition , cardiologist explained tests results and plan of care to pt.

## 2015-10-26 NOTE — Progress Notes (Signed)
[address]       [place] [address]             [phone]        [facility] Record [id number] Date of Birth: 16-Dec-1934  Referring: No ref. provider found Primary Care: [name]., MD  Chief Complaint:    Chief Complaint  Patient presents with  . Chest Pressure  . Shortness of Breath    History of Present Illness:     Called urgently to cath lab to see patient. Admitted yesterday now being cath ed and found to have critical left main disease and mod rca disease. Patient is  81 yo with history of CAD, CKD, and COPD came to ER with chest pain, found to have atrial fibrillation with RVR.  Patient had PTCA to RCA in 1988, no intervention since that time.  Over the last 3 wks, he had developed exertional chest pressure, noticing it usually when he would take the dogs out in the evening (chest pressure, relieved with rest.  He was seen in the cardiology  Office 10/20/2015 and given elevated creatinine,was to have a  Cardiolite rather than proceeding directly to cardiac cath.  He was scheduled for Cardiolite but had not had it done yet.   Yesterday ,he developed severe substernal chest pressure at rest.  This lasted for about an hour and he called EMS.  He was noted to the in atrial fibrillation with RVR.  He had not felt palpitations (just dyspnea and chest pain).  In the ER, chest pain resolved after getting IV metoprolol.  HR slowed from 130s to 90s but he remained in atrial fibrillation.     Current Activity/ Functional Status: Patient is independent with mobility/ambulation, transfers, ADL's, IADL's.   Zubrod Score: At the time of surgery this patient's most appropriate activity status/level should be described as: []     0    Normal activity, no symptoms [x]     1    Restricted in physical strenuous activity but ambulatory, able to do out light work []     2    Ambulatory and capable of self care, unable to do work activities, up  and about                 more than 50%  Of the time                            []     3    Only limited self care, in bed greater than 50% of waking hours []     4    Completely disabled, no self care, confined to bed or chair []     5    Moribund  Past Medical History:  Diagnosis Date  . Allergy   . Back pain   . Coronary artery disease   . Diabetes mellitus   . GERD (gastroesophageal reflux disease)   . Hypertension   . Neuropathy [facility])     Past Surgical History:  Procedure Laterality Date  . cataract surg     bil/2008  . COLONOSCOPY    . CORONARY STENT PLACEMENT     1988  . TONSILLECTOMY      History  Smoking Status  . Former Smoker  . Packs/day: 2.00  . Years: 30.00  . Types: Cigarettes  . Quit date: 06/28/2004  Smokeless Tobacco  . Never Used  History  Alcohol Use No    Comment: QUIT 06/28/2004    Social History   Social History  . Marital status: Married    Spouse name: N/A  . Number of children: N/A  . Years of education: N/A   Occupational History  . retired    Social History Main Topics  . Smoking status: Former Smoker    Packs/day: 2.00    Years: 30.00    Types: Cigarettes    Quit date: 06/28/2004  . Smokeless tobacco: Never Used  . Alcohol use No     Comment: QUIT 06/28/2004  . Drug use: No  . Sexual activity: Yes   Other Topics Concern  . Not on file   Social History Narrative  . No narrative on file    Allergies  Allergen Reactions  . Amoxicillin Other (See Comments)    PATIENT PASSES OUT!!!!  . Cephalexin Other (See Comments)    PATIENT PASSES OUT!!!!  . Penicillin G Other (See Comments)    Has patient had a PCN reaction causing immediate rash, facial/tongue/throat swelling, SOB or lightheadedness with hypotension: Yes Has patient had a PCN reaction causing severe rash involving mucus membranes or skin necrosis: No Has patient had a PCN reaction that required hospitalization: Yes Has patient had a PCN reaction occurring within  the last 10 years: Yes If all of the above answers are "NO", then may proceed with Cephalosporin use. PATIENT PASSES OUT!!!!    Current Facility-Administered Medications  Medication Dose Route Frequency Provider Last Rate Last Dose  . [MAR Hold] 0.9 %  sodium chloride infusion  250 mL Intravenous PRN Larey Dresser, MD      . 0.9 %  sodium chloride infusion  250 mL Intravenous PRN Larey Dresser, MD      . 0.9 %  sodium chloride infusion    Continuous PRN Troy Sine, MD 125 mL/hr at 10/26/15 1629 125 mL/hr at 10/26/15 1629  . 0.9% sodium chloride infusion  1 mL/kg/hr Intravenous Continuous Larey Dresser, MD 98 mL/hr at 10/26/15 0926 1 mL/kg/hr at 10/26/15 0926  . [MAR Hold] acetaminophen (TYLENOL) tablet 650 mg  650 mg Oral Q4H PRN Larey Dresser, MD      . Doug Sou Hold] aspirin EC tablet 81 mg  81 mg Oral Daily Larey Dresser, MD      . Doug Sou Hold] atorvastatin (LIPITOR) tablet 80 mg  80 mg Oral q1800 Larey Dresser, MD      . Doug Sou Hold] azelastine (ASTELIN) 0.1 % nasal spray 1-2 spray  1-2 spray Each Nare BID PRN Larey Dresser, MD      . bisacodyl (DULCOLAX) EC tablet 5 mg  5 mg Oral Once Ivin Poot, MD      . Derrill Memo ON 10/27/2015] chlorhexidine (PERIDEX) 0.12 % solution 15 mL  15 mL Mouth/Throat Once Ivin Poot, MD      . Chlorhexidine Gluconate Cloth 2 % PADS 6 each  6 each Topical Once Ivin Poot, MD      . dexmedetomidine (PRECEDEX) 400 MCG/100ML (4 mcg/mL) infusion  0.1-0.7 mcg/kg/hr Intravenous To OR Ivin Poot, MD      . DOPamine (INTROPIN) 800 mg in dextrose 5 % 250 mL (3.2 mg/mL) infusion  0-10 mcg/kg/min Intravenous To OR Ivin Poot, MD      . EPINEPHrine (ADRENALIN) 4 mg in dextrose 5 % 250 mL (0.016 mg/mL) infusion  0-10 mcg/min Intravenous To OR Ivin Poot, MD      . [  MAR Hold] fenofibrate tablet 54 mg  54 mg Oral Daily Larey Dresser, MD   54 mg at 10/26/15 1214  . fentaNYL (SUBLIMAZE) injection    PRN Troy Sine, MD   25 mcg at  10/26/15 1608  . heparin 2,500 Units, papaverine 30 mg in electrolyte-148 (PLASMALYTE-148) 500 mL irrigation   Irrigation To OR Ivin Poot, MD      . heparin 30,000 units/NS 1000 mL solution for CELLSAVER   Other To OR Ivin Poot, MD      . heparin ADULT infusion 100 units/mL (25000 units/241mL sodium chloride 0.45%)  1,250 Units/hr Intravenous Continuous Larey Dresser, MD 12.5 mL/hr at 10/26/15 0924 1,250 Units/hr at 10/26/15 0924  . heparin ADULT infusion 100 units/mL (25000 units/213mL sodium chloride 0.45%)    Continuous PRN Troy Sine, MD 10 mL/hr at 10/26/15 1543 1,000 Units/hr at 10/26/15 1543  . heparin infusion 2 units/mL in 0.9 % sodium chloride    Continuous PRN Troy Sine, MD   1,000 mL at 10/26/15 1527  . heparin injection    PRN Troy Sine, MD   5,000 Units at 10/26/15 1528  . [MAR Hold] insulin aspart (novoLOG) injection 0-15 Units  0-15 Units Subcutaneous TID WC Larey Dresser, MD      . Doug Sou Hold] insulin aspart (novoLOG) injection 0-5 Units  0-5 Units Subcutaneous QHS Larey Dresser, MD      . Doug Sou Hold] insulin glargine (LANTUS) injection 25 Units  25 Units Subcutaneous QHS Larey Dresser, MD      . insulin regular (NOVOLIN R,HUMULIN R) 250 Units in sodium chloride 0.9 % 250 mL (1 Units/mL) infusion   Intravenous To OR Ivin Poot, MD      . iopamidol (ISOVUE-370) 76 % injection    PRN Troy Sine, MD   75 mL at 10/26/15 1628  . [MAR Hold] isosorbide mononitrate (IMDUR) 24 hr tablet 30 mg  30 mg Oral Daily Larey Dresser, MD   30 mg at 10/26/15 1214  . levofloxacin (LEVAQUIN) IVPB 500 mg  500 mg Intravenous To OR Ivin Poot, MD      . lidocaine (PF) (XYLOCAINE) 1 % injection    PRN Troy Sine, MD   2 mL at 10/26/15 1524  . [MAR Hold] loratadine (CLARITIN) tablet 10 mg  10 mg Oral QPM Larey Dresser, MD      . magnesium sulfate (IV Push/IM) injection 40 mEq  40 mEq Other To OR Ivin Poot, MD      . metoprolol tartrate (LOPRESSOR)  tablet 12.5 mg  12.5 mg Oral Once Ivin Poot, MD      . Doug Sou Hold] nitroGLYCERIN (NITROSTAT) SL tablet 0.4 mg  0.4 mg Sublingual Q5 Min x 3 PRN Larey Dresser, MD      . nitroGLYCERIN 50 mg in dextrose 5 % 250 mL (0.2 mg/mL) infusion    Continuous PRN Troy Sine, MD 3 mL/hr at 10/26/15 1602 10 mcg/min at 10/26/15 1602  . [START ON 10/27/2015] nitroGLYCERIN 50 mg in dextrose 5 % 250 mL (0.2 mg/mL) infusion  2-200 mcg/min Intravenous To OR Ivin Poot, MD      . nitroPRUSSide (NIPRIDE) 200 mcg/mL in dextrose 5 % 250 mL infusion    Continuous PRN Troy Sine, MD      . Doug Sou Hold] ondansetron [facility]) injection 4 mg  4 mg Intravenous Q6H PRN Larey Dresser, MD      .  phenylephrine (NEO-SYNEPHRINE) 20 mg in dextrose 5 % 250 mL (0.08 mg/mL) infusion  30-200 mcg/min Intravenous To OR Ivin Poot, MD      . potassium chloride injection 80 mEq  80 mEq Other To OR Ivin Poot, MD      . Radial Cocktail/Verapamil only    PRN Troy Sine, MD   10 mL at 10/26/15 1525  . [MAR Hold] sodium chloride flush (NS) 0.9 % injection 3 mL  3 mL Intravenous Q12H Larey Dresser, MD      . Doug Sou Hold] sodium chloride flush (NS) 0.9 % injection 3 mL  3 mL Intravenous PRN Larey Dresser, MD      . sodium chloride flush (NS) 0.9 % injection 3 mL  3 mL Intravenous Q12H Larey Dresser, MD   3 mL at 10/26/15 1249  . sodium chloride flush (NS) 0.9 % injection 3 mL  3 mL Intravenous PRN Larey Dresser, MD      . tranexamic acid (CYKLOKAPRON) 2,500 mg in sodium chloride 0.9 % 250 mL (10 mg/mL) infusion  1.5 mg/kg/hr Intravenous To OR Ivin Poot, MD      . tranexamic acid (CYKLOKAPRON) bolus via infusion - over 30 minutes 1,497 mg  15 mg/kg Intravenous To OR Ivin Poot, MD      . tranexamic acid (CYKLOKAPRON) pump prime solution 200 mg  2 mg/kg Intracatheter To OR Ivin Poot, MD      . Doug Sou Hold] traZODone (DESYREL) tablet 100 mg  100 mg Oral QHS Larey Dresser, MD   100 mg at 10/26/15  0121  . vancomycin (VANCOCIN) 1,500 mg in sodium chloride 0.9 % 250 mL IVPB  1,500 mg Intravenous To OR Ivin Poot, MD        Prescriptions Prior to Admission  Medication Sig Dispense Refill Last Dose  . amLODipine (NORVASC) 10 MG tablet TAKE 1 TABLET (10 MG TOTAL) BY MOUTH DAILY. 90 tablet 2 10/25/2015 at 0830  . aspirin EC 325 MG tablet Take 325 mg by mouth daily.   10/25/2015 at am  . carvedilol (COREG) 3.125 MG tablet Take 1 tablet (3.125 mg total) by mouth 2 (two) times daily with a meal. 180 tablet 3 10/25/2015 at 0830  . cholecalciferol (VITAMIN D) 1000 units tablet Take 1,000 Units by mouth daily.   10/25/2015 at am  . diphenhydramine-acetaminophen (TYLENOL PM) 25-500 MG TABS tablet Take 2 tablets by mouth at bedtime as needed (for sleep).    10/24/2015 at pm  . fenofibrate 54 MG tablet Take 54 mg by mouth daily.   10/25/2015 at am  . furosemide (LASIX) 20 MG tablet TAKE 1 TABLET (20 MG TOTAL) BY MOUTH DAILY. 90 tablet 0 10/25/2015 at am  . Glucosamine-Chondroit-Vit C-Mn (GLUCOSAMINE CHONDR 1500 COMPLX PO) Take 2 tablets by mouth daily.    10/25/2015 at am  . hydrALAZINE (APRESOLINE) 50 MG tablet Take 75 mg by mouth 3 (three) times daily.   10/25/2015 at 1200  . isosorbide mononitrate (IMDUR) 30 MG 24 hr tablet Take 1 tablet (30 mg total) by mouth daily. 90 tablet 3 10/25/2015 at am  . levocetirizine (XYZAL) 5 MG tablet Take 0.5 tablets (2.5 mg total) by mouth every evening. 30 tablet 5 10/25/2015 at am  . losartan (COZAAR) 100 MG tablet Take 1 tablet (100 mg total) by mouth daily. 90 tablet 2 10/25/2015 at 0830  . niacin (NIASPAN) 1000 MG CR tablet Take 1,000 mg by mouth at  bedtime.   10/24/2015 at pm  . simvastatin (ZOCOR) 10 MG tablet Take 10 mg by mouth daily at 6 PM.    10/24/2015 at pm  . TOUJEO SOLOSTAR 300 UNIT/ML SOPN Inject 30 Units into the skin at bedtime.    10/24/2015 at pm  . traZODone (DESYREL) 50 MG tablet Take 100 mg by mouth at bedtime.    10/24/2015 at pm  .  azelastine (ASTELIN) 0.1 % nasal spray Place 1-2 sprays into both nostrils 2 (two) times daily as needed for rhinitis. (Patient not taking: Reported on 10/25/2015) 30 mL 5 Not Taking at Unknown time  . Olopatadine HCl (PATADAY) 0.2 % SOLN One drop each eye once a day for itchy eyes. (Patient not taking: Reported on 10/25/2015) 1 Bottle 5 Not Taking at Unknown time    Family History  Problem Relation Age of Onset  . Diabetes Father   . Colon cancer Neg Hx   . Esophageal cancer Neg Hx   . Stomach cancer Neg Hx   . Rectal cancer Neg Hx   . Allergic rhinitis Neg Hx   . Angioedema Neg Hx   . Asthma Neg Hx   . Eczema Neg Hx   . Immunodeficiency Neg Hx   . Urticaria Neg Hx      Review of Systems:     Cardiac Review of Systems: Y or N  Chest Pain [ y   ]  Resting SOB Blue.Reese   ] Exertional SOB  [ y ]  Orthopnea [ n ]   Pedal Edema [ n  ]    Palpitations [n  ] Syncope  [ n ]   Presyncope [ n  ]  General Review of Systems: [Y] = yes [  ]=no Constitional: recent weight change [  ]; anorexia [  ]; fatigue [  ]; nausea [  ]; night sweats [  ]; fever [  ]; or chills [  ]                                                               Dental: poor dentition[  ]; Last Dentist visit:   Eye : blurred vision [  ]; diplopia [   ]; vision changes [  ];  Amaurosis fugax[  ]; Resp: cough [  ];  wheezing[  ];  hemoptysis[n  ]; shortness of breath[n  ]; paroxysmal nocturnal dyspnea[ n ]; dyspnea on exertion[  ]; or orthopnea[  ];  GI:  gallstones[  ], vomiting[  ];  dysphagia[  ]; melena[  ];  hematochezia [  ]; heartburn[  ];   Hx of  Colonoscopy[  ]; GU: kidney stones [  ]; hematuria[  ];   dysuria [  ];  nocturia[  ];  history of     obstruction [  ]; urinary frequency [  ]             Skin: rash, swelling[  ];, hair loss[  ];  peripheral edema[  ];  or itching[  ]; Musculosketetal: myalgias[  ];  joint swelling[  ];  joint erythema[  ];  joint pain[  ];  back pain[  ];  Heme/Lymph: bruising[  ];  bleeding[   ];  anemia[  ];  Neuro: TIA[ n ];  headaches[  ];  stroke[  ];  vertigo[  ];  seizures[  ];   paresthesias[  ];  difficulty walking[  ];  Psych:depression[  ]; anxiety[  ];  Endocrine: diabetes[  ];  thyroid dysfunction[  ];  Immunizations: Flu [?  ]; Pneumococcal[  ?];  Other:  Physical Exam: BP 136/68 (BP Location: Right Arm)   Pulse 68   Temp 97.8 F (36.6 C) (Oral)   Resp 18   Ht 5\' 7"  (1.702 m)   Wt 220 lb (99.8 kg)   SpO2 95%   BMI 34.46 kg/m   In cath lab , cath from right wrist  General appearance: alert, cooperative and appears older than stated age Head: Normocephalic, without obvious abnormality, atraumatic Neck: no adenopathy, no carotid bruit, no JVD, supple, symmetrical, trachea midline and thyroid not enlarged, symmetric, no tenderness/mass/nodules Lymph nodes: Cervical, supraclavicular, and axillary nodes normal. Resp: diminished breath sounds bibasilar Back: symmetric, no curvature. ROM normal. No CVA tenderness. Cardio: irregularly irregular rhythm GI: soft, non-tender; bowel sounds normal; no masses,  no organomegaly Extremities: extremities normal, atraumatic, no cyanosis or edema and Homans sign is negative, no sign of DVT Neurologic: Grossly normal palpable pt and dp pulses bilateral  Diagnostic Studies & Laboratory data:     Recent Radiology Findings:   Dg Chest Portable 1 View  Result Date: 10/25/2015 CLINICAL DATA:  Chest pressure and shortness of breath for several days EXAM: PORTABLE CHEST 1 VIEW COMPARISON:  10/26/2008 FINDINGS: Cardiac shadow is at the upper limits of normal in size but stable. The lungs are well aerated bilaterally. No focal infiltrate or sizable effusion is seen no acute bony abnormality is noted. IMPRESSION: No active disease. Electronically Signed   By: Inez Catalina M.D.   On: 10/25/2015 21:53     I have independently reviewed the above radiologic studies.  Recent Lab Findings: Lab Results  Component Value Date   WBC 11.1  (H) 10/26/2015   HGB 11.6 (L) 10/26/2015   HCT 35.0 (L) 10/26/2015   PLT 264 10/26/2015   GLUCOSE 104 (H) 10/26/2015   ALT 24 10/26/2008   AST 41 (H) 10/26/2008   NA 140 10/26/2015   K 4.4 10/26/2015   CL 110 10/26/2015   CREATININE 1.57 (H) 10/26/2015   BUN 31 (H) 10/26/2015   CO2 24 10/26/2015   TSH 3.494 10/26/2015   INR 1.12 10/26/2015   Chronic Kidney Disease   Stage I     GFR >90  Stage II    GFR 60-89  Stage IIIA GFR 45-59  Stage IIIB GFR 30-44  Stage IV   GFR 15-29  Stage V    GFR  <15  Lab Results  Component Value Date   CREATININE 1.57 (H) 10/26/2015   Estimated Creatinine Clearance: 43 mL/min (by C-G formula based on SCr of 1.57 mg/dL (H)).    Assessment / Plan:    critical symptomatic left main CAD Copd mod to severe by history and notes Now in Afib since admission , no mention of afib when seen in cardiology 02/2015 Stage IIIB CKD GFR 30-44  With critical anatomy and crescendo angina precipitated  admission yesterday emergency CABG and possible atrial clip is best option explained to patient in detail. Dr Claiborne Billings has talked to family friend/emergency contact who will track down the remained of his family, patient reports he cares for his wife who has dementia.   The goals risks and alternatives of the planned surgical procedure emergency CABG possible atrial  clip  have been discussed with the patient in detail. The risks of the procedure including death, infection, stroke, myocardial infarction, bleeding, blood transfusion have all been discussed specifically.  I have quoted Mark Rivers a 8  % of perioperative mortality and a complication rate as high as 50 %. The patient's questions have been answered.Mark Rivers is willing  to proceed with the planned procedure.   Dr Darcey Nora will do procedure tonight  I  spent 30 minutes counseling the patient face to face and 50% or more the  time was spent in counseling and coordination of care. The total time spent  in the appointment was 60 minutes.    [name]      [address] [place] [address] Office [phone]   Murlean Hark [phone]  10/26/2015 4:31 PM     Patient ID: Mark Rivers, male   DOB: [birth year], 81 y.o.   MRN: [id number]

## 2015-10-26 NOTE — Interval H&P Note (Signed)
Cath Lab Visit (complete for each Cath Lab visit)  Clinical Evaluation Leading to the Procedure:   ACS: Yes.    Non-ACS:    Anginal Classification: CCS IV  Anti-ischemic medical therapy: Maximal Therapy (2 or more classes of medications)  Non-Invasive Test Results: No non-invasive testing performed  Prior CABG: No previous CABG      History and Physical Interval Note:  10/26/2015 3:00 PM  Mark Rivers  has presented today for surgery, with the diagnosis of cp  The various methods of treatment have been discussed with the patient and family. After consideration of risks, benefits and other options for treatment, the patient has consented to  Procedure(s): Left Heart Cath and Coronary Angiography (N/A) as a surgical intervention .  The patient's history has been reviewed, patient examined, no change in status, stable for surgery.  I have reviewed the patient's chart and labs.  Questions were answered to the patient's satisfaction.     Shelva Majestic

## 2015-10-26 NOTE — Progress Notes (Signed)
CRITICAL VALUE ALERT  Critical value received:  1.82  Date of notification: 10/26/15  Time of notification: refer to note below  Nurse who received alert: Did not receive alert. Noticed on Lab values  MD notified (1st page):  Dr. Aundra Dubin  Time of first page: 7:27am  MD notified (2nd page):n/a  Time of second page:n/a  Responding MD:  Dr. Aundra Dubin  Time MD responded: 7:30   No new orders will continue to monitor. Isac Caddy, RN

## 2015-10-26 NOTE — Anesthesia Procedure Notes (Addendum)
Central Venous Catheter Insertion Performed by: anesthesiologist 10/26/2015 5:15 PM Patient location: Pre-op. Preanesthetic checklist: patient identified, IV checked, site marked, risks and benefits discussed, surgical consent, monitors and equipment checked, pre-op evaluation, timeout performed and anesthesia consent Position: Trendelenburg Lidocaine 1% used for infiltration Landmarks identified and Seldinger technique used Catheter size: 9 Fr Central line and PA cath was placed.MAC introducer Swan type and PA catheter depth:thermodilution and 50PA Cath depth:50 Procedure performed using ultrasound guided technique. Attempts: 1 Following insertion, line sutured and dressing applied. Post procedure assessment: blood return through all ports, free fluid flow and no air. Patient tolerated the procedure well with no immediate complications.

## 2015-10-27 ENCOUNTER — Encounter (HOSPITAL_COMMUNITY): Payer: Self-pay | Admitting: Cardiovascular Disease

## 2015-10-27 ENCOUNTER — Inpatient Hospital Stay (HOSPITAL_COMMUNITY): Payer: Medicare Other

## 2015-10-27 DIAGNOSIS — I2510 Atherosclerotic heart disease of native coronary artery without angina pectoris: Secondary | ICD-10-CM | POA: Diagnosis present

## 2015-10-27 DIAGNOSIS — I214 Non-ST elevation (NSTEMI) myocardial infarction: Principal | ICD-10-CM

## 2015-10-27 DIAGNOSIS — J9600 Acute respiratory failure, unspecified whether with hypoxia or hypercapnia: Secondary | ICD-10-CM

## 2015-10-27 DIAGNOSIS — J9601 Acute respiratory failure with hypoxia: Secondary | ICD-10-CM

## 2015-10-27 DIAGNOSIS — Z951 Presence of aortocoronary bypass graft: Secondary | ICD-10-CM

## 2015-10-27 LAB — POCT I-STAT 3, ART BLOOD GAS (G3+)
Acid-Base Excess: 3 mmol/L — ABNORMAL HIGH (ref 0.0–2.0)
Acid-Base Excess: 3 mmol/L — ABNORMAL HIGH (ref 0.0–2.0)
Acid-Base Excess: 3 mmol/L — ABNORMAL HIGH (ref 0.0–2.0)
Acid-base deficit: 7 mmol/L — ABNORMAL HIGH (ref 0.0–2.0)
Acid-base deficit: 5 mmol/L — ABNORMAL HIGH (ref 0.0–2.0)
Acid-base deficit: 4 mmol/L — ABNORMAL HIGH (ref 0.0–2.0)
Acid-base deficit: 5 mmol/L — ABNORMAL HIGH (ref 0.0–2.0)
Bicarbonate: 21.5 mmol/L (ref 20.0–28.0)
Bicarbonate: 21.6 mmol/L (ref 20.0–28.0)
Bicarbonate: 20.9 mmol/L (ref 20.0–28.0)
Bicarbonate: 22.5 mmol/L (ref 20.0–28.0)
Bicarbonate: 27.8 mmol/L (ref 20.0–28.0)
Bicarbonate: 27 mmol/L (ref 20.0–28.0)
Bicarbonate: 26.1 mmol/L (ref 20.0–28.0)
O2 Saturation: 86 %
O2 Saturation: 93 %
O2 Saturation: 91 %
O2 Saturation: 92 %
O2 Saturation: 95 %
O2 Saturation: 95 %
O2 Saturation: 95 %
Patient temperature: 36.4
Patient temperature: 36.7
Patient temperature: 36.8
Patient temperature: 37.1
Patient temperature: 36.9
Patient temperature: 37.5
Patient temperature: 37.5
TCO2: 23 mmol/L (ref 0–100)
TCO2: 23 mmol/L (ref 0–100)
TCO2: 22 mmol/L (ref 0–100)
TCO2: 24 mmol/L (ref 0–100)
TCO2: 29 mmol/L (ref 0–100)
TCO2: 28 mmol/L (ref 0–100)
TCO2: 27 mmol/L (ref 0–100)
pCO2 arterial: 57.5 mmHg — ABNORMAL HIGH (ref 32.0–48.0)
pCO2 arterial: 43.9 mmHg (ref 32.0–48.0)
pCO2 arterial: 43.9 mmHg (ref 32.0–48.0)
pCO2 arterial: 45.4 mmHg (ref 32.0–48.0)
pCO2 arterial: 40.7 mmHg (ref 32.0–48.0)
pCO2 arterial: 38.9 mmHg (ref 32.0–48.0)
pCO2 arterial: 34.4 mmHg (ref 32.0–48.0)
pH, Arterial: 7.178 — CL (ref 7.350–7.450)
pH, Arterial: 7.298 — ABNORMAL LOW (ref 7.350–7.450)
pH, Arterial: 7.287 — ABNORMAL LOW (ref 7.350–7.450)
pH, Arterial: 7.302 — ABNORMAL LOW (ref 7.350–7.450)
pH, Arterial: 7.442 (ref 7.350–7.450)
pH, Arterial: 7.451 — ABNORMAL HIGH (ref 7.350–7.450)
pH, Arterial: 7.49 — ABNORMAL HIGH (ref 7.350–7.450)
pO2, Arterial: 64 mmHg — ABNORMAL LOW (ref 83.0–108.0)
pO2, Arterial: 75 mmHg — ABNORMAL LOW (ref 83.0–108.0)
pO2, Arterial: 69 mmHg — ABNORMAL LOW (ref 83.0–108.0)
pO2, Arterial: 71 mmHg — ABNORMAL LOW (ref 83.0–108.0)
pO2, Arterial: 73 mmHg — ABNORMAL LOW (ref 83.0–108.0)
pO2, Arterial: 75 mmHg — ABNORMAL LOW (ref 83.0–108.0)
pO2, Arterial: 73 mmHg — ABNORMAL LOW (ref 83.0–108.0)

## 2015-10-27 LAB — MAGNESIUM
MAGNESIUM: 2.4 mg/dL (ref 1.7–2.4)
MAGNESIUM: 2 mg/dL (ref 1.7–2.4)

## 2015-10-27 LAB — GLUCOSE, CAPILLARY
Glucose-Capillary: 104 mg/dL — ABNORMAL HIGH (ref 65–99)
Glucose-Capillary: 110 mg/dL — ABNORMAL HIGH (ref 65–99)
Glucose-Capillary: 115 mg/dL — ABNORMAL HIGH (ref 65–99)
Glucose-Capillary: 115 mg/dL — ABNORMAL HIGH (ref 65–99)
Glucose-Capillary: 125 mg/dL — ABNORMAL HIGH (ref 65–99)
Glucose-Capillary: 151 mg/dL — ABNORMAL HIGH (ref 65–99)
Glucose-Capillary: 158 mg/dL — ABNORMAL HIGH (ref 65–99)
Glucose-Capillary: 171 mg/dL — ABNORMAL HIGH (ref 65–99)
Glucose-Capillary: 172 mg/dL — ABNORMAL HIGH (ref 65–99)
Glucose-Capillary: 187 mg/dL — ABNORMAL HIGH (ref 65–99)
Glucose-Capillary: 194 mg/dL — ABNORMAL HIGH (ref 65–99)
Glucose-Capillary: 200 mg/dL — ABNORMAL HIGH (ref 65–99)
Glucose-Capillary: 206 mg/dL — ABNORMAL HIGH (ref 65–99)
Glucose-Capillary: 212 mg/dL — ABNORMAL HIGH (ref 65–99)
Glucose-Capillary: 214 mg/dL — ABNORMAL HIGH (ref 65–99)
Glucose-Capillary: 216 mg/dL — ABNORMAL HIGH (ref 65–99)
Glucose-Capillary: 225 mg/dL — ABNORMAL HIGH (ref 65–99)
Glucose-Capillary: 232 mg/dL — ABNORMAL HIGH (ref 65–99)
Glucose-Capillary: 87 mg/dL (ref 65–99)
Glucose-Capillary: 93 mg/dL (ref 65–99)
Glucose-Capillary: 96 mg/dL (ref 65–99)
Glucose-Capillary: 99 mg/dL (ref 65–99)

## 2015-10-27 LAB — PREPARE FRESH FROZEN PLASMA
UNIT DIVISION: 0
Unit division: 0

## 2015-10-27 LAB — CREATININE, SERUM
CREATININE: 1.97 mg/dL — AB (ref 0.61–1.24)
GFR calc Af Amer: 35 mL/min — ABNORMAL LOW (ref >60)
GFR, EST NON AFRICAN AMERICAN: 31 mL/min — AB (ref >60)

## 2015-10-27 LAB — BASIC METABOLIC PANEL
ANION GAP: 9 (ref 5–15)
BUN: 26 mg/dL — ABNORMAL HIGH (ref 6–20)
CHLORIDE: 107 mmol/L (ref 101–111)
CO2: 23 mmol/L (ref 22–32)
Calcium: 8.4 mg/dL — ABNORMAL LOW (ref 8.9–10.3)
Creatinine, Ser: 1.83 mg/dL — ABNORMAL HIGH (ref 0.61–1.24)
GFR calc non Af Amer: 33 mL/min — ABNORMAL LOW (ref >60)
GFR, EST AFRICAN AMERICAN: 39 mL/min — AB (ref >60)
Glucose, Bld: 219 mg/dL — ABNORMAL HIGH (ref 65–99)
POTASSIUM: 3.5 mmol/L (ref 3.5–5.1)
SODIUM: 139 mmol/L (ref 135–145)

## 2015-10-27 LAB — CBC
HCT: 27.2 % — ABNORMAL LOW (ref 39.0–52.0)
HCT: 26 % — ABNORMAL LOW (ref 39.0–52.0)
HEMATOCRIT: 28.6 % — AB (ref 39.0–52.0)
HEMOGLOBIN: 8.9 g/dL — AB (ref 13.0–17.0)
HEMOGLOBIN: 8.9 g/dL — AB (ref 13.0–17.0)
HEMOGLOBIN: 9.4 g/dL — AB (ref 13.0–17.0)
MCH: 30.6 pg (ref 26.0–34.0)
MCH: 30.8 pg (ref 26.0–34.0)
MCH: 31.1 pg (ref 26.0–34.0)
MCHC: 32.7 g/dL (ref 30.0–36.0)
MCHC: 32.9 g/dL (ref 30.0–36.0)
MCHC: 34.2 g/dL (ref 30.0–36.0)
MCV: 90.9 fL (ref 78.0–100.0)
MCV: 93.2 fL (ref 78.0–100.0)
MCV: 94.1 fL (ref 78.0–100.0)
PLATELETS: 215 10*3/uL (ref 150–400)
Platelets: 218 10*3/uL (ref 150–400)
Platelets: 242 10*3/uL (ref 150–400)
RBC: 2.86 MIL/uL — AB (ref 4.22–5.81)
RBC: 2.89 MIL/uL — AB (ref 4.22–5.81)
RBC: 3.07 MIL/uL — ABNORMAL LOW (ref 4.22–5.81)
RDW: 14.3 % (ref 11.5–15.5)
RDW: 14.3 % (ref 11.5–15.5)
RDW: 14.5 % (ref 11.5–15.5)
WBC: 21.6 10*3/uL — ABNORMAL HIGH (ref 4.0–10.5)
WBC: 24.9 10*3/uL — ABNORMAL HIGH (ref 4.0–10.5)
WBC: 26.9 10*3/uL — AB (ref 4.0–10.5)

## 2015-10-27 LAB — POCT I-STAT, CHEM 8
BUN: 31 mg/dL — ABNORMAL HIGH (ref 6–20)
Calcium, Ion: 1.16 mmol/L (ref 1.15–1.40)
Chloride: 102 mmol/L (ref 101–111)
Creatinine, Ser: 2 mg/dL — ABNORMAL HIGH (ref 0.61–1.24)
Glucose, Bld: 105 mg/dL — ABNORMAL HIGH (ref 65–99)
HCT: 26 % — ABNORMAL LOW (ref 39.0–52.0)
Hemoglobin: 8.8 g/dL — ABNORMAL LOW (ref 13.0–17.0)
Potassium: 3.7 mmol/L (ref 3.5–5.1)
Sodium: 141 mmol/L (ref 135–145)
TCO2: 26 mmol/L (ref 0–100)

## 2015-10-27 LAB — COOXEMETRY PANEL
Carboxyhemoglobin: 1.5 % (ref 0.5–1.5)
Methemoglobin: 0.6 % (ref 0.0–1.5)
O2 Saturation: 77 %
Total hemoglobin: 8.7 g/dL — ABNORMAL LOW (ref 12.0–16.0)

## 2015-10-27 LAB — POCT I-STAT 4, (NA,K, GLUC, HGB,HCT)
Glucose, Bld: 194 mg/dL — ABNORMAL HIGH (ref 65–99)
HCT: 26 % — ABNORMAL LOW (ref 39.0–52.0)
Hemoglobin: 8.8 g/dL — ABNORMAL LOW (ref 13.0–17.0)
Potassium: 3.9 mmol/L (ref 3.5–5.1)
Sodium: 141 mmol/L (ref 135–145)

## 2015-10-27 LAB — LACTIC ACID, PLASMA: Lactic Acid, Venous: 4.5 mmol/L (ref 0.5–1.9)

## 2015-10-27 LAB — PROCALCITONIN: Procalcitonin: 5.15 ng/mL

## 2015-10-27 LAB — MRSA PCR SCREENING: MRSA by PCR: NEGATIVE

## 2015-10-27 LAB — PROTIME-INR
INR: 1.44
Prothrombin Time: 17.7 seconds — ABNORMAL HIGH (ref 11.4–15.2)

## 2015-10-27 LAB — APTT: APTT: 36 s (ref 24–36)

## 2015-10-27 MED ORDER — ASPIRIN EC 325 MG PO TBEC
325.0000 mg | DELAYED_RELEASE_TABLET | Freq: Every day | ORAL | Status: DC
  Administered 2015-10-30: 325 mg via ORAL
  Filled 2015-10-27 (×2): qty 1

## 2015-10-27 MED ORDER — PROTAMINE SULFATE 10 MG/ML IV SOLN
INTRAVENOUS | Status: AC
  Filled 2015-10-27: qty 5

## 2015-10-27 MED ORDER — ACETAMINOPHEN 160 MG/5ML PO SOLN
1000.0000 mg | Freq: Four times a day (QID) | ORAL | Status: AC
  Administered 2015-10-27 – 2015-10-31 (×18): 1000 mg
  Filled 2015-10-27 (×16): qty 40.6

## 2015-10-27 MED ORDER — VANCOMYCIN HCL IN DEXTROSE 1-5 GM/200ML-% IV SOLN
1000.0000 mg | Freq: Once | INTRAVENOUS | Status: AC
  Administered 2015-10-27: 1000 mg via INTRAVENOUS
  Filled 2015-10-27: qty 200

## 2015-10-27 MED ORDER — FUROSEMIDE 10 MG/ML IJ SOLN
20.0000 mg | Freq: Two times a day (BID) | INTRAMUSCULAR | Status: DC
  Administered 2015-10-27 – 2015-10-29 (×5): 20 mg via INTRAVENOUS
  Filled 2015-10-27 (×5): qty 2

## 2015-10-27 MED ORDER — SODIUM CHLORIDE 0.9 % IV SOLN
INTRAVENOUS | Status: DC
  Administered 2015-10-27: 20 mL/h via INTRAVENOUS
  Administered 2015-10-27 – 2015-11-05 (×3): via INTRAVENOUS

## 2015-10-27 MED ORDER — SODIUM CHLORIDE 0.9 % IV SOLN
INTRAVENOUS | Status: DC
  Administered 2015-10-27: 4.2 [IU]/h via INTRAVENOUS
  Administered 2015-10-28: 4.5 [IU]/h via INTRAVENOUS
  Administered 2015-10-29: 3.3 [IU]/h via INTRAVENOUS
  Filled 2015-10-27 (×4): qty 2.5

## 2015-10-27 MED ORDER — ALBUMIN HUMAN 5 % IV SOLN
250.0000 mL | INTRAVENOUS | Status: AC | PRN
  Administered 2015-10-27 (×3): 250 mL via INTRAVENOUS

## 2015-10-27 MED ORDER — MORPHINE SULFATE (PF) 2 MG/ML IV SOLN
2.0000 mg | INTRAVENOUS | Status: DC | PRN
  Administered 2015-10-27 (×3): 4 mg via INTRAVENOUS
  Administered 2015-10-27: 2 mg via INTRAVENOUS
  Administered 2015-10-27 – 2015-10-28 (×4): 4 mg via INTRAVENOUS
  Administered 2015-10-28 (×2): 2 mg via INTRAVENOUS
  Administered 2015-10-28 (×2): 4 mg via INTRAVENOUS
  Administered 2015-10-28 – 2015-11-12 (×3): 2 mg via INTRAVENOUS
  Filled 2015-10-27 (×2): qty 2
  Filled 2015-10-27: qty 1
  Filled 2015-10-27 (×4): qty 2
  Filled 2015-10-27: qty 1
  Filled 2015-10-27 (×4): qty 2
  Filled 2015-10-27 (×2): qty 1
  Filled 2015-10-27: qty 2
  Filled 2015-10-27: qty 1

## 2015-10-27 MED ORDER — SODIUM CHLORIDE 0.9% FLUSH: 3.0000 mL | INTRAVENOUS | Status: DC | PRN

## 2015-10-27 MED ORDER — PANTOPRAZOLE SODIUM 40 MG PO TBEC
40.0000 mg | DELAYED_RELEASE_TABLET | Freq: Every day | ORAL | Status: DC
Start: 2015-10-28 — End: 2015-10-29

## 2015-10-27 MED ORDER — DOPAMINE-DEXTROSE 3.2-5 MG/ML-% IV SOLN
INTRAVENOUS | Status: DC | PRN
  Administered 2015-10-26: 3 ug/kg/min via INTRAVENOUS

## 2015-10-27 MED ORDER — LACTATED RINGERS IV SOLN: INTRAVENOUS | Status: DC

## 2015-10-27 MED ORDER — EPINEPHRINE PF 1 MG/10ML IJ SOSY
PREFILLED_SYRINGE | INTRAMUSCULAR | Status: AC
Start: 2015-10-27 — End: 2015-10-27
  Filled 2015-10-27: qty 10

## 2015-10-27 MED ORDER — MAGNESIUM SULFATE 2 GM/50ML IV SOLN
2.0000 g | Freq: Once | INTRAVENOUS | Status: AC
  Administered 2015-10-27: 2 g via INTRAVENOUS
  Filled 2015-10-27: qty 50

## 2015-10-27 MED ORDER — ALBUMIN HUMAN 5 % IV SOLN
INTRAVENOUS | Status: AC
  Administered 2015-10-27: 12.5 g
  Filled 2015-10-27: qty 250

## 2015-10-27 MED ORDER — MIDAZOLAM HCL 2 MG/2ML IJ SOLN
2.0000 mg | INTRAMUSCULAR | Status: DC | PRN
  Administered 2015-10-27 – 2015-11-01 (×34): 2 mg via INTRAVENOUS
  Filled 2015-10-27 (×38): qty 2

## 2015-10-27 MED ORDER — CHLORHEXIDINE GLUCONATE 0.12% ORAL RINSE (MEDLINE KIT)
15.0000 mL | Freq: Two times a day (BID) | OROMUCOSAL | Status: DC
  Administered 2015-10-27 – 2015-11-03 (×15): 15 mL via OROMUCOSAL

## 2015-10-27 MED ORDER — TRAMADOL HCL 50 MG PO TABS: 50.0000 mg | ORAL_TABLET | Freq: Four times a day (QID) | ORAL | Status: DC | PRN

## 2015-10-27 MED ORDER — METOPROLOL TARTRATE 25 MG/10 ML ORAL SUSPENSION
12.5000 mg | Freq: Two times a day (BID) | ORAL | Status: DC
  Administered 2015-10-27 – 2015-11-19 (×34): 12.5 mg
  Filled 2015-10-27 (×37): qty 5

## 2015-10-27 MED ORDER — DOCUSATE SODIUM 100 MG PO CAPS: 200.0000 mg | ORAL_CAPSULE | Freq: Every day | ORAL | Status: DC

## 2015-10-27 MED ORDER — SODIUM BICARBONATE 8.4 % IV SOLN
100.0000 meq | Freq: Once | INTRAVENOUS | Status: AC
  Administered 2015-10-27: 100 meq via INTRAVENOUS

## 2015-10-27 MED ORDER — SODIUM CHLORIDE 0.9 % IJ SOLN
INTRAMUSCULAR | Status: AC
  Filled 2015-10-27: qty 10

## 2015-10-27 MED ORDER — TRAZODONE HCL 100 MG PO TABS
100.0000 mg | ORAL_TABLET | Freq: Every day | ORAL | Status: DC
  Filled 2015-10-27: qty 1

## 2015-10-27 MED ORDER — SODIUM CHLORIDE 0.9 % IV SOLN: 250.0000 mL | INTRAVENOUS | Status: DC

## 2015-10-27 MED ORDER — DEXMEDETOMIDINE HCL IN NACL 400 MCG/100ML IV SOLN
0.0000 ug/kg/h | INTRAVENOUS | Status: DC
  Administered 2015-10-27 (×4): 0.7 ug/kg/h via INTRAVENOUS
  Administered 2015-10-27 – 2015-10-30 (×14): 1.2 ug/kg/h via INTRAVENOUS
  Filled 2015-10-27 (×22): qty 100

## 2015-10-27 MED ORDER — MORPHINE SULFATE (PF) 2 MG/ML IV SOLN
1.0000 mg | INTRAVENOUS | Status: AC | PRN
  Administered 2015-10-27: 2 mg via INTRAVENOUS
  Administered 2015-10-27 (×3): 4 mg via INTRAVENOUS
  Filled 2015-10-27 (×3): qty 2

## 2015-10-27 MED ORDER — PHENYLEPHRINE HCL 10 MG/ML IJ SOLN
0.0000 ug/min | INTRAMUSCULAR | Status: DC
  Administered 2015-10-27: 50 ug/min via INTRAVENOUS
  Filled 2015-10-27 (×3): qty 2

## 2015-10-27 MED ORDER — IPRATROPIUM BROMIDE 0.02 % IN SOLN
0.5000 mg | Freq: Four times a day (QID) | RESPIRATORY_TRACT | Status: DC
  Administered 2015-10-27 – 2015-10-28 (×5): 0.5 mg via RESPIRATORY_TRACT
  Filled 2015-10-27 (×5): qty 2.5

## 2015-10-27 MED ORDER — SODIUM BICARBONATE 8.4 % IV SOLN
50.0000 meq | Freq: Once | INTRAVENOUS | Status: AC
  Administered 2015-10-27: 50 meq via INTRAVENOUS

## 2015-10-27 MED ORDER — ARTIFICIAL TEARS OP OINT
TOPICAL_OINTMENT | OPHTHALMIC | Status: AC
  Filled 2015-10-27: qty 3.5

## 2015-10-27 MED ORDER — LORATADINE 10 MG PO TABS
10.0000 mg | ORAL_TABLET | Freq: Every evening | ORAL | Status: DC
  Administered 2015-10-29 – 2015-11-01 (×4): 10 mg via ORAL
  Filled 2015-10-27 (×4): qty 1

## 2015-10-27 MED ORDER — ACETAMINOPHEN 500 MG PO TABS
1000.0000 mg | ORAL_TABLET | Freq: Four times a day (QID) | ORAL | Status: AC
  Administered 2015-10-30: 1000 mg via ORAL
  Filled 2015-10-27: qty 2

## 2015-10-27 MED ORDER — SODIUM CHLORIDE 0.45 % IV SOLN
INTRAVENOUS | Status: DC | PRN
  Administered 2015-10-27: via INTRAVENOUS

## 2015-10-27 MED ORDER — OXYCODONE HCL 5 MG PO TABS: 5.0000 mg | ORAL_TABLET | ORAL | Status: DC | PRN

## 2015-10-27 MED ORDER — FENTANYL CITRATE (PF) 250 MCG/5ML IJ SOLN
INTRAMUSCULAR | Status: AC
  Filled 2015-10-27: qty 5

## 2015-10-27 MED ORDER — LACTATED RINGERS IV SOLN: 500.0000 mL | Freq: Once | INTRAVENOUS | Status: DC | PRN

## 2015-10-27 MED ORDER — EPINEPHRINE PF 1 MG/ML IJ SOLN
3.0000 ug/min | INTRAMUSCULAR | Status: AC
  Filled 2015-10-27: qty 4

## 2015-10-27 MED ORDER — LEVOFLOXACIN IN D5W 750 MG/150ML IV SOLN
750.0000 mg | INTRAVENOUS | Status: AC
  Administered 2015-10-27: 750 mg via INTRAVENOUS
  Filled 2015-10-27: qty 150

## 2015-10-27 MED ORDER — POTASSIUM CHLORIDE 10 MEQ/50ML IV SOLN
10.0000 meq | INTRAVENOUS | Status: AC
  Administered 2015-10-27 (×2): 10 meq via INTRAVENOUS

## 2015-10-27 MED ORDER — CHLORHEXIDINE GLUCONATE 0.12 % MT SOLN
15.0000 mL | OROMUCOSAL | Status: AC
  Administered 2015-10-27: 15 mL via OROMUCOSAL

## 2015-10-27 MED ORDER — SODIUM CHLORIDE 0.9% FLUSH
3.0000 mL | Freq: Two times a day (BID) | INTRAVENOUS | Status: DC
  Administered 2015-10-27 – 2015-11-20 (×16): 3 mL via INTRAVENOUS

## 2015-10-27 MED ORDER — DOPAMINE-DEXTROSE 3.2-5 MG/ML-% IV SOLN
0.0000 ug/kg/min | INTRAVENOUS | Status: DC
  Administered 2015-10-28: 3 ug/kg/min via INTRAVENOUS
  Filled 2015-10-27: qty 250

## 2015-10-27 MED ORDER — BISACODYL 5 MG PO TBEC
10.0000 mg | DELAYED_RELEASE_TABLET | Freq: Every day | ORAL | Status: DC
Start: 2015-10-27 — End: 2015-11-06
  Administered 2015-10-27 – 2015-11-05 (×5): 10 mg via ORAL
  Filled 2015-10-27 (×6): qty 2

## 2015-10-27 MED ORDER — ONDANSETRON HCL 4 MG/2ML IJ SOLN
4.0000 mg | Freq: Four times a day (QID) | INTRAMUSCULAR | Status: DC | PRN
  Administered 2015-11-06 – 2015-11-12 (×5): 4 mg via INTRAVENOUS
  Filled 2015-10-27 (×6): qty 2

## 2015-10-27 MED ORDER — FAMOTIDINE IN NACL 20-0.9 MG/50ML-% IV SOLN
20.0000 mg | Freq: Two times a day (BID) | INTRAVENOUS | Status: AC
  Administered 2015-10-27 (×2): 20 mg via INTRAVENOUS
  Filled 2015-10-27 (×2): qty 50

## 2015-10-27 MED ORDER — DEXMEDETOMIDINE HCL IN NACL 200 MCG/50ML IV SOLN
0.0000 ug/kg/h | INTRAVENOUS | Status: DC
  Administered 2015-10-27: 0.7 ug/kg/h via INTRAVENOUS
  Filled 2015-10-27: qty 50

## 2015-10-27 MED ORDER — BUDESONIDE 0.5 MG/2ML IN SUSP
0.5000 mg | Freq: Two times a day (BID) | RESPIRATORY_TRACT | Status: DC
  Administered 2015-10-27 – 2015-11-26 (×61): 0.5 mg via RESPIRATORY_TRACT
  Filled 2015-10-27 (×61): qty 2

## 2015-10-27 MED ORDER — HEPARIN SODIUM (PORCINE) 1000 UNIT/ML IJ SOLN
INTRAMUSCULAR | Status: AC
  Filled 2015-10-27: qty 1

## 2015-10-27 MED ORDER — METOPROLOL TARTRATE 5 MG/5ML IV SOLN
2.5000 mg | INTRAVENOUS | Status: DC | PRN
  Administered 2015-11-07 – 2015-11-08 (×5): 5 mg via INTRAVENOUS
  Administered 2015-11-09 – 2015-11-10 (×2): 2.5 mg via INTRAVENOUS
  Administered 2015-11-10 – 2015-11-25 (×3): 5 mg via INTRAVENOUS
  Administered 2015-11-25: 2.5 mg via INTRAVENOUS
  Filled 2015-10-27 (×12): qty 5

## 2015-10-27 MED ORDER — ASPIRIN 81 MG PO CHEW
324.0000 mg | CHEWABLE_TABLET | Freq: Every day | ORAL | Status: DC
  Administered 2015-10-27 – 2015-11-22 (×26): 324 mg
  Filled 2015-10-27 (×27): qty 4

## 2015-10-27 MED ORDER — CALCIUM CHLORIDE 10 % IV SOLN
INTRAVENOUS | Status: AC
  Filled 2015-10-27: qty 10

## 2015-10-27 MED ORDER — SODIUM BICARBONATE 8.4 % IV SOLN
INTRAVENOUS | Status: AC
Start: 2015-10-27 — End: 2015-10-27
  Filled 2015-10-27: qty 50

## 2015-10-27 MED ORDER — EPINEPHRINE PF 1 MG/ML IJ SOLN
3.0000 ug/min | INTRAVENOUS | Status: DC
  Administered 2015-10-27 – 2015-10-28 (×2): 3 ug/min via INTRAVENOUS
  Filled 2015-10-27 (×3): qty 4

## 2015-10-27 MED ORDER — ORAL CARE MOUTH RINSE
15.0000 mL | Freq: Four times a day (QID) | OROMUCOSAL | Status: DC
  Administered 2015-10-27 – 2015-10-29 (×8): 15 mL via OROMUCOSAL

## 2015-10-27 MED ORDER — METOPROLOL TARTRATE 12.5 MG HALF TABLET
12.5000 mg | ORAL_TABLET | Freq: Two times a day (BID) | ORAL | Status: DC
  Administered 2015-11-03: 12.5 mg via ORAL
  Filled 2015-10-27 (×2): qty 1

## 2015-10-27 MED ORDER — BISACODYL 10 MG RE SUPP
10.0000 mg | Freq: Every day | RECTAL | Status: DC
  Administered 2015-11-02 – 2015-11-03 (×2): 10 mg via RECTAL
  Filled 2015-10-27 (×2): qty 1

## 2015-10-27 MED FILL — Sodium Bicarbonate IV Soln 8.4%: INTRAVENOUS | Qty: 50 | Status: AC

## 2015-10-27 MED FILL — Sodium Chloride IV Soln 0.9%: INTRAVENOUS | Qty: 2000 | Status: AC

## 2015-10-27 MED FILL — Electrolyte-R (PH 7.4) Solution: INTRAVENOUS | Qty: 3000 | Status: AC

## 2015-10-27 MED FILL — Mannitol IV Soln 20%: INTRAVENOUS | Qty: 500 | Status: AC

## 2015-10-27 MED FILL — Lidocaine HCl IV Inj 20 MG/ML: INTRAVENOUS | Qty: 15 | Status: AC

## 2015-10-27 MED FILL — Heparin Sodium (Porcine) Inj 1000 Unit/ML: INTRAMUSCULAR | Qty: 60 | Status: AC

## 2015-10-27 NOTE — Progress Notes (Signed)
Dr Prescott Gum paged with critical abg results from 0430. Updated on patient condition. Orders received.

## 2015-10-27 NOTE — Procedures (Signed)
Arterial Catheter Insertion Procedure Note Mark Rivers [id number] 1934/06/16  Procedure: Insertion of Arterial Catheter  Indications: Blood pressure monitoring and Frequent blood sampling  Procedure Details Consent: Unable to obtain consent because of altered level of consciousness. Time Out: Verified patient identification, verified procedure, site/side was marked, verified correct patient position, special equipment/implants available, medications/allergies/relevent history reviewed, required imaging and test results available.  Performed  Maximum sterile technique was used including antiseptics, cap, gloves, gown, hand hygiene, mask and sheet. Skin prep: Chlorhexidine; local anesthetic administered 20 gauge catheter was inserted into left radial artery using the Seldinger technique.  Evaluation Blood flow good; BP tracing good. Complications: No apparent complications.   Estill Bamberg 10/27/2015

## 2015-10-27 NOTE — Progress Notes (Signed)
34f sheath removed from rt radial artery.  TR band applied to site with 10 cc air in the band. Radial pulse doppled.  No hematoma, bruising or bleeding.

## 2015-10-27 NOTE — Transfer of Care (Signed)
Immediate Anesthesia Transfer of Care Note  Patient: Joshau Kishbaugh  Procedure(s) Performed: Procedure(s) with comments: CORONARY ARTERY BYPASS GRAFTING (CABG) x3(LIMA to LA, SVG to OM1, SVG to PDA) with EVH from the left thigh and partial lower leg greater saphenous vein and left internal mammary artery (N/A) TRANSESOPHAGEAL ECHOCARDIOGRAM (TEE) (N/A) INTRA-AORTIC BALLOON PUMP INSERTION size 40cc (Left) - femoral  Patient Location: SICU  Anesthesia Type:General  Level of Consciousness: Patient remains intubated per anesthesia plan  Airway & Oxygen Therapy: Patient remains intubated per anesthesia plan and Patient placed on Ventilator (see vital sign flow sheet for setting)  Post-op Assessment: Report given to RN and remains on IABP 1;1' Dr. Prescott Gum and bedsidel  Post vital signs: Reviewed  Last Vitals:  Vitals:   10/26/15 1643 10/26/15 1648  BP: 120/69   Pulse: 66 (!) 0  Resp: 15 (!) 0  Temp:      Last Pain:  Vitals:   10/26/15 0811  TempSrc:   PainSc: 0-No pain         Complications: No apparent anesthesia complications

## 2015-10-27 NOTE — Procedures (Signed)
MD at bedside, vent settings changed per MD, will follow up with ABG, RT will monitor

## 2015-10-27 NOTE — Progress Notes (Signed)
Patient ID: Mark Rivers, male   DOB: 1934/04/16, 81 y.o.   MRN: [id number] EVENING ROUNDS NOTE :     [address]       [place] [address]             [phone]                 1 Day Post-Op Procedure(s) (LRB): CORONARY ARTERY BYPASS GRAFTING (CABG) x3(LIMA to LA, SVG to OM1, SVG to PDA) with EVH from the left thigh and partial lower leg greater saphenous vein and left internal mammary artery (N/A) TRANSESOPHAGEAL ECHOCARDIOGRAM (TEE) (N/A) INTRA-AORTIC BALLOON PUMP INSERTION size 40cc (Left)  Total Length of Stay:  LOS: 2 days  BP (!) 111/92   Pulse (!) 52   Temp 99.7 F (37.6 C)   Resp (!) 30   Ht 5\' 6"  (1.676 m)   Wt 237 lb 3.4 oz (107.6 kg)   SpO2 97%   BMI 38.29 kg/m   .Intake/Output      10/18 0701 - 10/19 0700   P.O.    I.V. (mL/kg) 2213.2 (20.6)   Blood    NG/GT    IV Piggyback 400   Total Intake(mL/kg) 2613.2 (24.3)   Urine (mL/kg/hr) 775 (0.6)   Emesis/NG output 300 (0.2)   Stool    Blood    Chest Tube 240 (0.2)   Total Output 1315   Net +1298.2         . sodium chloride 10 mL/hr at 10/27/15 1200  . sodium chloride    . sodium chloride 20 mL/hr (10/27/15 1521)  . amiodarone 30 mg/hr (10/27/15 1900)  . dexmedetomidine 0.7 mcg/kg/hr (10/27/15 1900)  . DOPamine 3 mcg/kg/min (10/27/15 1900)  . epinephrine 3 mcg/min (10/27/15 1900)  . insulin (NOVOLIN-R) infusion 2.6 mL/hr at 10/27/15 1900  . lactated ringers 20 mL/hr at 10/27/15 1200  . lactated ringers 20 mL/hr at 10/27/15 1200  . milrinone 0.3 mcg/kg/min (10/27/15 1900)  . norepinephrine (LEVOPHED) Adult infusion 8 mcg/min (10/27/15 1900)  . phenylephrine (NEO-SYNEPHRINE) Adult infusion Stopped (10/27/15 1300)     Lab Results  Component Value Date   WBC 21.6 (H) 10/27/2015   HGB 8.9 (L) 10/27/2015   HCT 26.0 (L) 10/27/2015   PLT 215 10/27/2015   GLUCOSE 105 (H) 10/27/2015   ALT 24 10/26/2008   AST 41 (H) 10/26/2008   NA 141 10/27/2015   K 3.7 10/27/2015   CL 102  10/27/2015   CREATININE 1.97 (H) 10/27/2015   BUN 31 (H) 10/27/2015   CO2 23 10/27/2015   TSH 3.494 10/26/2015   INR 1.44 10/27/2015   Sedated on vent with IAB in place bp supported with dopamine and levophed  [name]  Beeper [phone] Office [phone] 10/27/2015 7:23 PM

## 2015-10-27 NOTE — Progress Notes (Signed)
CRITICAL VALUE ALERT  Critical value received:  Lactic acid  Date of notification:  10/18  Time of notification:  0933  Critical value read back:Yes  Nurse who received alert:  Venia Carbon   MD notified (1st page):  [phone]  Time of first page:  0935  MD notified (2nd page):  Time of second page:  Responding MD:  Corrie Dandy  Time MD responded: [phone]

## 2015-10-27 NOTE — Progress Notes (Signed)
Initial Nutrition Assessment  DOCUMENTATION CODES:   Obesity unspecified  INTERVENTION:    If TF started, recommend initiating Vital High Protein at goal rate of 65 ml/h (1560 ml per day) to provide 1560 kcals, 136 gm protein, 1304 ml free water daily.  NUTRITION DIAGNOSIS:   Inadequate oral intake related to inability to eat as evidenced by NPO status  GOAL:   Provide needs based on ASPEN/SCCM guidelines  MONITOR:   Vent status, Labs, Weight trends, Skin, I & O's  REASON FOR ASSESSMENT:   Ventilator  ASSESSMENT:   81 yo WM with PMH significant for but not limited to, CAD with RCA stent 1988, IDDM,HTN, GERD; who has had increasing DOE for 3 weeks prior to admit. 10/16 he presented with acute chest pain, increased SOB and new Afib with RVR. CC revealed 99% Left main and he was taken urgently to CABGx 3 per CVTS 10/17 1730 and presented to SICU 2230.   Patient s/p procedure 10/16: CORONARY ARTERY BYPASS GRAFTING (CABG) x 3  INTRA-AORTIC BALLOON PUMP INSERTION  Patient is currently intubated on ventilator support >> OGT in place Temp (24hrs), Avg:97.9 F (36.6 C), Min:97.3 F (36.3 C), Max:98.8 F (37.1 C)  Pt presented 10/17 with NSTEMI >> required urgent surgery. Developed severe hypoxia in AM 10/18. No muscle or subcutaneous fat depletion noticed. Labs and medications reviewed.  Diet Order:  Diet NPO time specified   CBG (last 3)   Recent Labs  10/27/15 1305 10/27/15 1402 10/27/15 1504  GLUCAP 158* 151* 125*   Skin:  Reviewed, no issues  Last BM:  10/17  Height:   Ht Readings from Last 1 Encounters:  10/27/15 5\' 6"  (1.676 m)    Weight:   Wt Readings from Last 1 Encounters:  10/27/15 237 lb 3.4 oz (107.6 kg)    Ideal Body Weight:  59 kg  BMI:  Body mass index is 38.29 kg/m.  Estimated Nutritional Needs:   Kcal:  [id number]  Protein:  130-140 gm  Fluid:  per MD  EDUCATION NEEDS:   No education needs identified at this time  [name], RD, LDN Pager #: [phone] After-Hours Pager #: [phone]

## 2015-10-27 NOTE — Consult Note (Signed)
PULMONARY / CRITICAL CARE MEDICINE   Name: Mark Rivers MRN: [id number] DOB: [birth year]    ADMISSION DATE:  10/25/2015 CONSULTATION DATE:  10/18  REFERRING MD:  CVTS  CHIEF COMPLAINT:  HYPOXIA  HISTORY OF PRESENT ILLNESS:   81 yo WM with PMH significant for but not limited to, CAD with RCA stent 1988, IDDM,HTN, GERD,  CKD base creatine 1.20, Emphysema PFT 4/15 DLCO % 63%, who has had increasing DOE for 3 weeks prior to admit. 10/16 he presented with acute chest pain , increased SOB and new Afib with RVR. CC revealed 99% Left main and he was taken urgently to CABGx 3 per CVTS 10/17 1730 and presented to SICU 2230. Early am he developed refractory hypoxia and PCCM was asked to manage vent. Note CI was > 4. Vent changes were made with improve oxygenation.  PAST MEDICAL HISTORY :  He  has a past medical history of Allergy; Back pain; Coronary artery disease; Diabetes mellitus; GERD (gastroesophageal reflux disease); Hypertension; and Neuropathy ([place]).  PAST SURGICAL HISTORY: He  has a past surgical history that includes cataract surg; Coronary stent placement; Tonsillectomy; and Colonoscopy.  Allergies  Allergen Reactions  . Amoxicillin Other (See Comments)    PATIENT PASSES OUT!!!!  . Cephalexin Other (See Comments)    PATIENT PASSES OUT!!!!  . Penicillin G Other (See Comments)    Has patient had a PCN reaction causing immediate rash, facial/tongue/throat swelling, SOB or lightheadedness with hypotension: Yes Has patient had a PCN reaction causing severe rash involving mucus membranes or skin necrosis: No Has patient had a PCN reaction that required hospitalization: Yes Has patient had a PCN reaction occurring within the last 10 years: Yes If all of the above answers are "NO", then may proceed with Cephalosporin use. PATIENT PASSES OUT!!!!    No current facility-administered medications on file prior to encounter.    Current Outpatient Prescriptions on File Prior to Encounter   Medication Sig  . amLODipine (NORVASC) 10 MG tablet TAKE 1 TABLET (10 MG TOTAL) BY MOUTH DAILY.  . carvedilol (COREG) 3.125 MG tablet Take 1 tablet (3.125 mg total) by mouth 2 (two) times daily with a meal.  . cholecalciferol (VITAMIN D) 1000 units tablet Take 1,000 Units by mouth daily.  . diphenhydramine-acetaminophen (TYLENOL PM) 25-500 MG TABS tablet Take 2 tablets by mouth at bedtime as needed (for sleep).   . fenofibrate 54 MG tablet Take 54 mg by mouth daily.  . furosemide (LASIX) 20 MG tablet TAKE 1 TABLET (20 MG TOTAL) BY MOUTH DAILY.  [name] Glucosamine-Chondroit-Vit C-Mn (GLUCOSAMINE CHONDR 1500 COMPLX PO) Take 2 tablets by mouth daily.   . hydrALAZINE (APRESOLINE) 50 MG tablet Take 75 mg by mouth 3 (three) times daily.  . isosorbide mononitrate (IMDUR) 30 MG 24 hr tablet Take 1 tablet (30 mg total) by mouth daily.  [name] levocetirizine (XYZAL) 5 MG tablet Take 0.5 tablets (2.5 mg total) by mouth every evening.  [name] losartan (COZAAR) 100 MG tablet Take 1 tablet (100 mg total) by mouth daily.  . niacin (NIASPAN) 1000 MG CR tablet Take 1,000 mg by mouth at bedtime.  . simvastatin (ZOCOR) 10 MG tablet Take 10 mg by mouth daily at 6 PM.   . TOUJEO SOLOSTAR 300 UNIT/ML SOPN Inject 30 Units into the skin at bedtime.   . traZODone (DESYREL) 50 MG tablet Take 100 mg by mouth at bedtime.   [name] azelastine (ASTELIN) 0.1 % nasal spray Place 1-2 sprays into both nostrils 2 (two) times daily  as needed for rhinitis. (Patient not taking: Reported on 10/25/2015)  . Olopatadine HCl (PATADAY) 0.2 % SOLN One drop each eye once a day for itchy eyes. (Patient not taking: Reported on 10/25/2015)    FAMILY HISTORY:  His indicated that his mother is deceased. He indicated that his father is deceased. He indicated that his brother is alive. He indicated that his maternal grandmother is deceased. He indicated that his maternal grandfather is deceased. He indicated that his paternal grandmother is deceased. He indicated  that his paternal grandfather is deceased. He indicated that the status of his neg hx is unknown.    SOCIAL HISTORY: He  reports that he quit smoking about 11 years ago. His smoking use included Cigarettes. He has a 60.00 pack-year smoking history. He has never used smokeless tobacco. He reports that he does not drink alcohol or use drugs.  REVIEW OF SYSTEMS:   NA  SUBJECTIVE:  Sedated on vent HD stable, abg's improved  VITAL SIGNS: BP 125/61   Pulse (!) 57   Temp 98.2 F (36.8 C)   Resp (!) 28   Ht 5\' 7"  (1.702 m)   Wt 237 lb 3.4 oz (107.6 kg)   SpO2 100%   BMI 37.15 kg/m   HEMODYNAMICS: PAP: (43-54)/(20-26) 50/24 CVP:  [13 mmHg-19 mmHg] 16 mmHg CO:  [9.8 L/min-11.4 L/min] 9.8 L/min CI:  [4.7 L/min/m2-5.4 L/min/m2] 4.7 L/min/m2  VENTILATOR SETTINGS: Vent Mode: SIMV;PSV;PRVC FiO2 (%):  [100 %] 100 % Set Rate:  [18 bmp-28 bmp] 28 bmp Vt Set:  [530 mL-600 mL] 600 mL PEEP:  [5 cmH20] 5 cmH20 Pressure Support:  [10 cmH20] 10 cmH20 Plateau Pressure:  [12 cmH20-16 cmH20] 12 cmH20  INTAKE / OUTPUT: I/O last 3 completed shifts: In: 9357.2 [I.V.:6489.2; Blood:1088; NG/GT:30; IV Piggyback:1750] Out: [id number] [Urine:3050; Blood:1000; Chest Tube:260]  PHYSICAL EXAMINATION: General:  WNWDWM sedated on vent Neuro:  Does arouse , no follows commands but is sedated HEENT: OTT-> vent , OGT-> LWIS, RT IJ PA cath Cardiovascular:  HSIR tachycardia vr 109 , pvc's, underlying afib, CI >4.0 Lungs:  Decreased bs thru out, MSCT x 3-> scant bloody drainage Abdomen:  Soft , No bs Musculoskeletal: intactt Skin:  All ext warm, good pulses  LABS:  BMET  Recent Labs Lab 10/25/15 2110 10/26/15 0605  10/26/15 2134 10/26/15 2236 10/27/15 0448  NA 137 140  < > 138 142 139  K 4.1 4.4  < > 5.5* 3.7 3.5  CL 104 110  < > 104 101 107  CO2 23 24  --   --   --  23  BUN 39* 31*  < > 25* 26* 26*  CREATININE 1.91* 1.57*  < > 1.20 1.30* 1.83*  GLUCOSE 183* 104*  < > 139* 204* 219*  < > = values  in this interval not displayed.  Electrolytes  Recent Labs Lab 10/25/15 2110 10/26/15 0605 10/27/15 0448  CALCIUM 9.2 8.5* 8.4*  MG  --   --  2.4    CBC  Recent Labs Lab 10/26/15 0605  10/26/15 2039  10/26/15 2236 10/27/15 0026 10/27/15 0448  WBC 11.1*  --   --   --   --  24.9* 26.9*  HGB 11.6*  < > 8.8*  < > 10.5* 9.4* 8.9*  HCT 35.0*  < > 26.4*  < > 31.0* 28.6* 27.2*  PLT 264  --  203  --   --  218 242  < > = values in this interval not displayed.  Coag's  Recent Labs Lab 10/26/15 0137 10/27/15 0026  APTT  --  36  INR 1.12 1.44    Sepsis Markers No results for input(s): LATICACIDVEN, PROCALCITON, O2SATVEN in the last 168 hours.  ABG  Recent Labs Lab 10/26/15 2305 10/27/15 0431 10/27/15 0650  PHART 7.300* 7.178* 7.298*  PCO2ART 48.6* 57.5* 43.9  PO2ART 85.0 64.0* 75.0*    Liver Enzymes No results for input(s): AST, ALT, ALKPHOS, BILITOT, ALBUMIN in the last 168 hours.  Cardiac Enzymes  Recent Labs Lab 10/26/15 0137 10/26/15 0605 10/26/15 1311  TROPONINI 0.79* 1.82* 2.11*    Glucose  Recent Labs Lab 10/27/15 0209 10/27/15 0319 10/27/15 0425 10/27/15 0508 10/27/15 0559 10/27/15 0649  GLUCAP 206* 194* 216* 232* 225* 214*    Imaging Dg Chest Port 1 View  Result Date: 10/27/2015 CLINICAL DATA:  Pain 81 year old male status post CABG. EXAM: PORTABLE CHEST 1 VIEW COMPARISON:  Chest radiograph dated 10/26/2015 FINDINGS: The tip of the endotracheal tube is seen a lower positioning and the prior study and may be tilted towards the right mainstem bronchus. The carina is not well visualized due to portable technique. Recommend retraction of the tube by approximately 3-4 cm. Enteric tube courses into the left hemi abdomen with sideport below the level of the diaphragm and teeth below the inferior margin of the image. Bilateral chest tubes, Swan-Ganz catheter, and intra-aortic balloon pump appear in stable positioning. There is median sternotomy  wires and postsurgical changes of CABG. Bilateral perihilar streaky densities as well as left lung base opacity appears similar prior radiograph. There is no pneumothorax. No acute osseous pathology. IMPRESSION: Interval inferior displacement of the endotracheal tube with tip tilting towards the right mainstem bronchus. Recommend retraction by approximately 3- 4 cm for optimal positioning. The remainder of the support lines and tubes are in stable positioning. Postsurgical changes of CABG with mild vascular congestion and left lung base probable atelectasis. Infiltrate is not excluded. Clinical correlation is recommended. These results were called by telephone at the time of interpretation on 10/27/2015 at 1:34 am to nurse [facility] who verbally acknowledged these results. Electronically Signed   By: Anner Crete M.D.   On: 10/27/2015 01:56   Dg Chest Portable 1 View  Result Date: 10/27/2015 CLINICAL DATA:  Endotracheal tube placement. CABG earlier tonight. Go EXAM: PORTABLE CHEST 1 VIEW COMPARISON:  None. FINDINGS: New median sternotomy sutures are in place. Endotracheal tube tip is in satisfactory position approximately 4.7 cm above the carina. Aortic balloon and Swan-Ganz catheters are noted as well as bilateral chest tubes. The chest tubes terminate at the level of the posterior fifth and sixth ribs. Streaky bilateral pulmonary opacities noted bilaterally, upper lobe predominant with more homogeneous consolidation and/or atelectasis at the left lung base. IMPRESSION: Satisfactory support line and tube positions. New new median sternotomy and post CABG change. Mild to moderate vascular congestion. Left lower lobe atelectasis. Small left lower lobe pneumonic consolidation not entirely excluded. Electronically Signed   By: Ashley Royalty M.D.   On: 10/27/2015 00:26     STUDIES:    CULTURES: 10/18 sputum>> 10/18 bcx 2>>  ANTIBIOTICS: 10/17 levaquin>> 10/17 Vanc>>   SIGNIFICANT EVENTS: 10/17  present to to hospital via ems with cp 10/17 CC 99% LM 10/17 1745 urgently to OR for CABG x 3 10/18 0600 PCCM consulted for refractory hypoxia  LINES/TUBES: 10/17 OTT>> 10/17 CT's x 3>> 10/17 l rad a line>> 10/17 l fem IABP>> 10/17 R IJ PA cath>>  DISCUSSION: 81 yo with  hx of cad and presented 10/17 with NSTEMI, went to cath lab and has 99% left main and required urgent surgery. Hypoxia during the early morning of 10/18 and PCCM called for vent management early am 10/18  ASSESSMENT / PLAN:  PULMONARY A: Severe hypoxia post CABG x 3 10/17 COPD P:   Vent changes with mode changed to [facility] Peep increased 10 Vt decreased 580 Increased RR 30  Repeat abg 30 minutes May need NMB  CARDIOVASCULAR A:  CAD post RCA stent 1988. NSEMI 10/17 with CC 99% LM 10/17 CABG x 3 per CVTS P:  IABP per cvts Pressors per CVTS CI >4 10/18 on multiple pressors  RENAL Lab Results  Component Value Date   CREATININE 1.83 (H) 10/27/2015   CREATININE 1.30 (H) 10/26/2015   CREATININE 1.20 10/26/2015   CREATININE 1.90 (H) 10/20/2015     A:   Chronic kidney disease , base creatine 1.20 P:   Monitor creatine Avoid nephrotoxins  GASTROINTESTINAL A:   Hx of GERD GI protection P:   PPI OGT NPO currently   HEMATOLOGIC  Recent Labs  10/27/15 0026 10/27/15 0448  HGB 9.4* 8.9*    A:   Blood loss anemia P:  Transfuse per protocol  INFECTIOUS A:   No acute infection P:   Preop vanc and levaquin  ENDOCRINE CBG (last 3)   Recent Labs  10/27/15 0508 10/27/15 0559 10/27/15 0649  GLUCAP 232* 225* 214*     A:   DM P:   SSI Insulin drip  NEUROLOGIC A:   Heavily sedated for vent management. Neuro intact prior to OR 1700 P:   RASS goal: -1 Sedate as needed for vent managment   FAMILY  - Updates: None at bedside  - Inter-disciplinary family meet or Palliative Care meeting due by:  10/25    Richardson Landry Minor ACNP Maryanna Shape PCCM Pager [phone] till 3 pm If no  answer page [phone] 10/27/2015, 7:31 AM    ATTENDING NOTE / ATTESTATION NOTE :   I have discussed the case with the resident/APP  Steve Minor.   I agree with the resident/APP's  history, physical examination, assessment, and plans.    I have edited the above note and modified it according to our agreed history, physical examination, assessment and plan.   Briefly, pt with known COPD (moderate per PFTs in 2015, not too symptomatic), concern for OSA, admitted for worsening cp and exertional SOB. Had new onset afib and NSTEMI. LHC showed  99% Left main and he was taken urgently to CABGx 3 per CVTS 10/17 1730 and presented to SICU 2230. Post op, was kept on SIMV mode.  ABG initial showed hypoxemia and hypercapnea.  PCCM was consulted.  We switched him to [facility] mode and adjusted his settings.  Rpt ABG this am were acceptable.  He is on Levophed, Epinephrine, Dopamine, Amiodarone, milrinone, heparin drips. He is on broad spectrum abx.   Pt seen, sedated, comfortable. BP 120/60, HR 60, RR 28, o2 sats were 95% on 80% FiO2. Afebrile. Sedated, intubated. Comfortable. (-) NVD. ETT in place. Good ae, dec BS bibasilar with some crackles. (-) wheezing heard. Variable s1. (-) mm. Abd was distended with dec BS. (-) masses/tenderness. (-) edema, cool distal extremities.   Labs reviewed. WBC elevated. Creatinine elevated. HCO3 23. CXR with bibasilar atelectasis. ABG 7.3/45/71 on 80%  Assessment and Plan : 1. Acute Hypoxemic Hypercapneic Resp Failure 2/2   Pulmonary edema  S/P CABG x 3, 10/18  Doubt HCAP  COPD > pt has COPD but was stable on admission - cont vent support. We decreased Fio2 to 80% > sats > 90%. Increased the PEEP to 10 from 5 and tolerating with some autopeep.  We also increased the rate and dec TV to 8 mls/kg.  On rpt abg > adjust rate and TV if with hyperventilation. I dont think he has ARDS at this point.  - will pan culture.  - cont Levaquin and Vancomycin pending culture.  - check  lactate, PCT - add pulmicort and atrovent neb meds - will need diuresis once off pressors.   2. Shock, cardiogenic - cont dopamine, levophed, epinephrine, milrinone drips per TCVS - cont IABP  3. CAD S/P CABG x3 on 10/18; Afib; CHF - per cardiology - on heparin drip - will need diuresis once off drips   I have spent 30  minutes of critical care time with this patient today.  Family : No family at bedside.    Monica Becton, MD 10/27/2015, 1:25 PM [facility] Pager [phone] After 3 pm or if no answer, call [phone]

## 2015-10-27 NOTE — Progress Notes (Signed)
1 Day Post-Op Procedure(s) (LRB): CORONARY ARTERY BYPASS GRAFTING (CABG) x3(LIMA to LA, SVG to OM1, SVG to PDA) with EVH from the left thigh and partial lower leg greater saphenous vein and left internal mammary artery (N/A) TRANSESOPHAGEAL ECHOCARDIOGRAM (TEE) (N/A) INTRA-AORTIC BALLOON PUMP INSERTION size 40cc (Left) Subjective: Sedated on vent CCM control of vent Weaning NE Leave IABP 1:2- high output low SVR  Objective: Vital signs in last 24 hours: Temp:  [97.3 F (36.3 C)-98.4 F (36.9 C)] 98.2 F (36.8 C) (10/18 0700) Pulse Rate:  [0-118] 108 (10/18 0746) Cardiac Rhythm: Atrial fibrillation (10/18 0800) Resp:  [0-30] 30 (10/18 0746) BP: (98-136)/(43-81) 108/46 (10/18 0746) SpO2:  [0 %-100 %] 96 % (10/18 0848) Arterial Line BP: (64-114)/(40-77) 114/50 (10/18 0700) FiO2 (%):  [80 %-100 %] 80 % (10/18 0848) Weight:  [237 lb 3.4 oz (107.6 kg)] 237 lb 3.4 oz (107.6 kg) (10/18 0500)  Hemodynamic parameters for last 24 hours: PAP: (43-54)/(20-26) 50/24 CVP:  [13 mmHg-19 mmHg] 16 mmHg CO:  [9.8 L/min-11.4 L/min] 9.8 L/min CI:  [4.7 L/min/m2-5.4 L/min/m2] 4.7 L/min/m2  Intake/Output from previous day: 10/17 0701 - 10/18 0700 In: 8357.2 [I.V.:5489.2; Blood:1088; NG/GT:30; IV Piggyback:1750] Out: [id number] [Urine:2700; Blood:1000; Chest Tube:260] Intake/Output this shift: Total I/O In: 788.3 [I.V.:538.3; IV Piggyback:250] Out: 265 [Urine:185; Chest Tube:80]  Moved all extremities  Lab Results:  Recent Labs  10/27/15 0026 10/27/15 0448  WBC 24.9* 26.9*  HGB 9.4* 8.9*  HCT 28.6* 27.2*  PLT 218 242   BMET:  Recent Labs  10/26/15 0605  10/26/15 2236 10/27/15 0023 10/27/15 0448  NA 140  < > 142 141 139  K 4.4  < > 3.7 3.9 3.5  CL 110  < > 101  --  107  CO2 24  --   --   --  23  GLUCOSE 104*  < > 204* 194* 219*  BUN 31*  < > 26*  --  26*  CREATININE 1.57*  < > 1.30*  --  1.83*  CALCIUM 8.5*  --   --   --  8.4*  < > = values in this interval not displayed.   PT/INR:  Recent Labs  10/27/15 0026  LABPROT 17.7*  INR 1.44   ABG    Component Value Date/Time   PHART 7.302 (L) 10/27/2015 0845   HCO3 22.5 10/27/2015 0845   TCO2 24 10/27/2015 0845   ACIDBASEDEF 4.0 (H) 10/27/2015 0845   O2SAT 92.0 10/27/2015 0845   CBG (last 3)   Recent Labs  10/27/15 0508 10/27/15 0559 10/27/15 0649  GLUCAP 232* 225* 214*    Assessment/Plan: S/P Procedure(s) (LRB): CORONARY ARTERY BYPASS GRAFTING (CABG) x3(LIMA to LA, SVG to OM1, SVG to PDA) with EVH from the left thigh and partial lower leg greater saphenous vein and left internal mammary artery (N/A) TRANSESOPHAGEAL ECHOCARDIOGRAM (TEE) (N/A) INTRA-AORTIC BALLOON PUMP INSERTION size 40cc (Left) Diuresis wean NE for SAP > 100   LOS: 2 days    Mark Rivers 10/27/2015

## 2015-10-27 NOTE — Progress Notes (Signed)
[place] Progress Note Patient Name: Mark Rivers DOB: May 18, 1934 MRN: [id number]   Date of Service  10/27/2015  HPI/Events of Note  ABG on 70%/PRVC 30/TV 510/P 10 = 7.49/34.4/73. Patient is breathing with the mechanical ventilator set rate.   eICU Interventions  Will order: 1. Decrease PRVC rate to 24.  2. Follow ABG Q 4 hours per orders.      Intervention Category Major Interventions: Acid-Base disturbance - evaluation and management;Respiratory failure - evaluation and management  Sommer,Steven Eugene 10/27/2015, 9:07 PM

## 2015-10-28 ENCOUNTER — Inpatient Hospital Stay (HOSPITAL_COMMUNITY): Payer: Medicare Other

## 2015-10-28 LAB — POCT I-STAT 3, ART BLOOD GAS (G3+)
Acid-Base Excess: 2 mmol/L (ref 0.0–2.0)
Bicarbonate: 24.2 mmol/L (ref 20.0–28.0)
Bicarbonate: 24.6 mmol/L (ref 20.0–28.0)
Bicarbonate: 25.1 mmol/L (ref 20.0–28.0)
Bicarbonate: 25.1 mmol/L (ref 20.0–28.0)
Bicarbonate: 26.3 mmol/L (ref 20.0–28.0)
O2 Saturation: 88 %
O2 Saturation: 89 %
O2 Saturation: 89 %
O2 Saturation: 90 %
O2 Saturation: 90 %
Patient temperature: 36.8
Patient temperature: 36.8
Patient temperature: 37.1
Patient temperature: 37.2
Patient temperature: 37.3
TCO2: 25 mmol/L (ref 0–100)
TCO2: 26 mmol/L (ref 0–100)
TCO2: 26 mmol/L (ref 0–100)
TCO2: 26 mmol/L (ref 0–100)
TCO2: 27 mmol/L (ref 0–100)
pCO2 arterial: 37.8 mmHg (ref 32.0–48.0)
pCO2 arterial: 38.5 mmHg (ref 32.0–48.0)
pCO2 arterial: 39.4 mmHg (ref 32.0–48.0)
pCO2 arterial: 41.2 mmHg (ref 32.0–48.0)
pCO2 arterial: 41.3 mmHg (ref 32.0–48.0)
pH, Arterial: 7.392 (ref 7.350–7.450)
pH, Arterial: 7.411 (ref 7.350–7.450)
pH, Arterial: 7.412 (ref 7.350–7.450)
pH, Arterial: 7.413 (ref 7.350–7.450)
pH, Arterial: 7.416 (ref 7.350–7.450)
pO2, Arterial: 54 mmHg — ABNORMAL LOW (ref 83.0–108.0)
pO2, Arterial: 56 mmHg — ABNORMAL LOW (ref 83.0–108.0)
pO2, Arterial: 58 mmHg — ABNORMAL LOW (ref 83.0–108.0)
pO2, Arterial: 59 mmHg — ABNORMAL LOW (ref 83.0–108.0)
pO2, Arterial: 59 mmHg — ABNORMAL LOW (ref 83.0–108.0)

## 2015-10-28 LAB — GLUCOSE, CAPILLARY
Glucose-Capillary: 110 mg/dL — ABNORMAL HIGH (ref 65–99)
Glucose-Capillary: 114 mg/dL — ABNORMAL HIGH (ref 65–99)
Glucose-Capillary: 117 mg/dL — ABNORMAL HIGH (ref 65–99)
Glucose-Capillary: 117 mg/dL — ABNORMAL HIGH (ref 65–99)
Glucose-Capillary: 118 mg/dL — ABNORMAL HIGH (ref 65–99)
Glucose-Capillary: 118 mg/dL — ABNORMAL HIGH (ref 65–99)
Glucose-Capillary: 119 mg/dL — ABNORMAL HIGH (ref 65–99)
Glucose-Capillary: 121 mg/dL — ABNORMAL HIGH (ref 65–99)
Glucose-Capillary: 121 mg/dL — ABNORMAL HIGH (ref 65–99)
Glucose-Capillary: 121 mg/dL — ABNORMAL HIGH (ref 65–99)
Glucose-Capillary: 123 mg/dL — ABNORMAL HIGH (ref 65–99)
Glucose-Capillary: 127 mg/dL — ABNORMAL HIGH (ref 65–99)
Glucose-Capillary: 127 mg/dL — ABNORMAL HIGH (ref 65–99)
Glucose-Capillary: 128 mg/dL — ABNORMAL HIGH (ref 65–99)
Glucose-Capillary: 128 mg/dL — ABNORMAL HIGH (ref 65–99)
Glucose-Capillary: 130 mg/dL — ABNORMAL HIGH (ref 65–99)
Glucose-Capillary: 130 mg/dL — ABNORMAL HIGH (ref 65–99)
Glucose-Capillary: 130 mg/dL — ABNORMAL HIGH (ref 65–99)
Glucose-Capillary: 133 mg/dL — ABNORMAL HIGH (ref 65–99)
Glucose-Capillary: 135 mg/dL — ABNORMAL HIGH (ref 65–99)
Glucose-Capillary: 137 mg/dL — ABNORMAL HIGH (ref 65–99)
Glucose-Capillary: 139 mg/dL — ABNORMAL HIGH (ref 65–99)
Glucose-Capillary: 139 mg/dL — ABNORMAL HIGH (ref 65–99)
Glucose-Capillary: 140 mg/dL — ABNORMAL HIGH (ref 65–99)

## 2015-10-28 LAB — COMPREHENSIVE METABOLIC PANEL
ALT: 29 U/L (ref 17–63)
AST: 75 U/L — ABNORMAL HIGH (ref 15–41)
Albumin: 3.3 g/dL — ABNORMAL LOW (ref 3.5–5.0)
Alkaline Phosphatase: 34 U/L — ABNORMAL LOW (ref 38–126)
Anion gap: 5 (ref 5–15)
BUN: 27 mg/dL — ABNORMAL HIGH (ref 6–20)
CO2: 26 mmol/L (ref 22–32)
Calcium: 7.9 mg/dL — ABNORMAL LOW (ref 8.9–10.3)
Chloride: 106 mmol/L (ref 101–111)
Creatinine, Ser: 1.96 mg/dL — ABNORMAL HIGH (ref 0.61–1.24)
GFR calc Af Amer: 36 mL/min — ABNORMAL LOW (ref >60)
GFR calc non Af Amer: 31 mL/min — ABNORMAL LOW (ref >60)
Glucose, Bld: 120 mg/dL — ABNORMAL HIGH (ref 65–99)
Potassium: 3.8 mmol/L (ref 3.5–5.1)
Sodium: 137 mmol/L (ref 135–145)
Total Bilirubin: 0.3 mg/dL (ref 0.3–1.2)
Total Protein: 5.2 g/dL — ABNORMAL LOW (ref 6.5–8.1)

## 2015-10-28 LAB — BLOOD GAS, ARTERIAL
Acid-Base Excess: 1.9 mmol/L (ref 0.0–2.0)
Bicarbonate: 25.9 mmol/L (ref 20.0–28.0)
Drawn by: 252031
FIO2: 100
MECHVT: 510 mL
O2 Saturation: 92.8 %
PEEP: 10 cmH2O
Patient temperature: 98.6
RATE: 25 resp/min
pCO2 arterial: 39.8 mmHg (ref 32.0–48.0)
pH, Arterial: 7.429 (ref 7.350–7.450)
pO2, Arterial: 66.2 mmHg — ABNORMAL LOW (ref 83.0–108.0)

## 2015-10-28 LAB — CBC
HCT: 27 % — ABNORMAL LOW (ref 39.0–52.0)
Hemoglobin: 9.1 g/dL — ABNORMAL LOW (ref 13.0–17.0)
MCH: 30.7 pg (ref 26.0–34.0)
MCHC: 33.7 g/dL (ref 30.0–36.0)
MCV: 91.2 fL (ref 78.0–100.0)
Platelets: 191 10*3/uL (ref 150–400)
RBC: 2.96 MIL/uL — ABNORMAL LOW (ref 4.22–5.81)
RDW: 14.6 % (ref 11.5–15.5)
WBC: 23.2 10*3/uL — ABNORMAL HIGH (ref 4.0–10.5)

## 2015-10-28 LAB — COOXEMETRY PANEL
Carboxyhemoglobin: 0.8 % (ref 0.5–1.5)
Methemoglobin: 1 % (ref 0.0–1.5)
O2 Saturation: 60.9 %
Total hemoglobin: 8.9 g/dL — ABNORMAL LOW (ref 12.0–16.0)

## 2015-10-28 LAB — POCT I-STAT, CHEM 8
BUN: 28 mg/dL — ABNORMAL HIGH (ref 6–20)
Calcium, Ion: 1.13 mmol/L — ABNORMAL LOW (ref 1.15–1.40)
Chloride: 102 mmol/L (ref 101–111)
Creatinine, Ser: 1.9 mg/dL — ABNORMAL HIGH (ref 0.61–1.24)
Glucose, Bld: 132 mg/dL — ABNORMAL HIGH (ref 65–99)
HCT: 25 % — ABNORMAL LOW (ref 39.0–52.0)
Hemoglobin: 8.5 g/dL — ABNORMAL LOW (ref 13.0–17.0)
Potassium: 4.1 mmol/L (ref 3.5–5.1)
Sodium: 140 mmol/L (ref 135–145)
TCO2: 25 mmol/L (ref 0–100)

## 2015-10-28 LAB — LACTIC ACID, PLASMA: Lactic Acid, Venous: 1.1 mmol/L (ref 0.5–1.9)

## 2015-10-28 LAB — PROCALCITONIN: PROCALCITONIN: 5.36 ng/mL

## 2015-10-28 MED ORDER — IPRATROPIUM-ALBUTEROL 0.5-2.5 (3) MG/3ML IN SOLN
3.0000 mL | Freq: Four times a day (QID) | RESPIRATORY_TRACT | Status: DC
  Administered 2015-10-28 – 2015-11-02 (×23): 3 mL via RESPIRATORY_TRACT
  Filled 2015-10-28 (×23): qty 3

## 2015-10-28 MED ORDER — FUROSEMIDE 10 MG/ML IJ SOLN: 40.0000 mg | Freq: Once | INTRAMUSCULAR | Status: DC | PRN

## 2015-10-28 MED ORDER — FENTANYL BOLUS VIA INFUSION
25.0000 ug | INTRAVENOUS | Status: DC | PRN
  Administered 2015-11-02 (×2): 25 ug via INTRAVENOUS
  Filled 2015-10-28: qty 25

## 2015-10-28 MED ORDER — FENTANYL 2500MCG IN NS 250ML (10MCG/ML) PREMIX INFUSION
25.0000 ug/h | INTRAVENOUS | Status: DC
  Administered 2015-10-28: 50 ug/h via INTRAVENOUS
  Administered 2015-10-29 – 2015-10-30 (×2): 100 ug/h via INTRAVENOUS
  Administered 2015-10-31: 150 ug/h via INTRAVENOUS
  Administered 2015-10-31: 175 ug/h via INTRAVENOUS
  Administered 2015-11-01: 150 ug/h via INTRAVENOUS
  Administered 2015-11-01 – 2015-11-02 (×2): 175 ug/h via INTRAVENOUS
  Administered 2015-11-03 – 2015-11-05 (×3): 100 ug/h via INTRAVENOUS
  Filled 2015-10-28 (×12): qty 250

## 2015-10-28 MED ORDER — AMIODARONE IV BOLUS ONLY 150 MG/100ML: 150.0000 mg | Freq: Once | INTRAVENOUS | Status: DC

## 2015-10-28 MED ORDER — TRAZODONE HCL 50 MG PO TABS
100.0000 mg | ORAL_TABLET | Freq: Every evening | ORAL | Status: DC | PRN
  Administered 2015-11-06 – 2015-11-14 (×6): 100 mg via ORAL
  Filled 2015-10-28 (×6): qty 2

## 2015-10-28 MED ORDER — FUROSEMIDE 10 MG/ML IJ SOLN
40.0000 mg | Freq: Once | INTRAMUSCULAR | Status: AC
Start: 2015-10-28 — End: 2015-10-28
  Administered 2015-10-28: 40 mg via INTRAVENOUS
  Filled 2015-10-28: qty 4

## 2015-10-28 MED ORDER — POTASSIUM CHLORIDE 10 MEQ/50ML IV SOLN
10.0000 meq | INTRAVENOUS | Status: AC
  Administered 2015-10-28 (×2): 10 meq via INTRAVENOUS
  Filled 2015-10-28 (×2): qty 50

## 2015-10-28 MED ORDER — AMIODARONE LOAD VIA INFUSION
150.0000 mg | Freq: Once | INTRAVENOUS | Status: AC
  Administered 2015-10-28: 150 mg via INTRAVENOUS
  Filled 2015-10-28: qty 83.34

## 2015-10-28 NOTE — Progress Notes (Signed)
10/28/2015 0845 Dr. Prescott Gum at bedside and verbal order received to repeat IV Amiodarone Bolus 150 mg x 1 as well as 2 Runs IV KCL. Order also received to repeat tracheal aspirate. RT informed of need to collect sample. Orders enacted. Will continue to closely monitor patient.  Mark Rivers, Arville Lime

## 2015-10-28 NOTE — Progress Notes (Signed)
Patient ID: Mark Rivers, male   DOB: April 16, 1934, 81 y.o.   MRN: [id number]   SICU Evening Rounds:   Hemodynamically stable  CI = 2.7 on dop, levo, epi and milrinone. IABP   Remains intubated and sedated on 100% FiO2 and 10 PEEP. His PO2 this pm is 66. ABG otherwise ok.   Urine output good  CT output low  CBC    Component Value Date/Time   WBC 23.2 (H) 10/28/2015 0300   RBC 2.96 (L) 10/28/2015 0300   HGB 8.5 (L) 10/28/2015 1602   HCT 25.0 (L) 10/28/2015 1602   PLT 191 10/28/2015 0300   MCV 91.2 10/28/2015 0300   MCH 30.7 10/28/2015 0300   MCHC 33.7 10/28/2015 0300   RDW 14.6 10/28/2015 0300   LYMPHSABS 1.7 10/25/2015 2110   MONOABS 0.9 10/25/2015 2110   EOSABS 0.6 10/25/2015 2110   BASOSABS 0.1 10/25/2015 2110     BMET    Component Value Date/Time   NA 140 10/28/2015 1602   K 4.1 10/28/2015 1602   CL 102 10/28/2015 1602   CO2 26 10/28/2015 0300   GLUCOSE 132 (H) 10/28/2015 1602   BUN 28 (H) 10/28/2015 1602   CREATININE 1.90 (H) 10/28/2015 1602   CREATININE 1.90 (H) 10/20/2015 1606   CALCIUM 7.9 (L) 10/28/2015 0300   GFRNONAA 31 (L) 10/28/2015 0300   GFRAA 36 (L) 10/28/2015 0300     A/P: He is stable tonight.  Continue current plans. Possibly remove IABP in the am. His oxygen and PEEP requirement is out of proportion to his CXR this am. Hopefully this will improve quickly.

## 2015-10-28 NOTE — Progress Notes (Signed)
PULMONARY / CRITICAL CARE MEDICINE   Name: Mark Rivers MRN: [id number] DOB: Jul 03, 1934    ADMISSION DATE:  10/25/2015 CONSULTATION DATE:  10/18  REFERRING MD:  CVTS  CHIEF COMPLAINT:  HYPOXIA  HISTORY OF PRESENT ILLNESS:   81 yo WM with PMH significant for but not limited to, CAD with RCA stent 1988, IDDM,HTN, GERD,  CKD base creatine 1.20, Emphysema PFT 4/15 DLCO % 63%, who has had increasing DOE for 3 weeks prior to admit. 10/16 he presented with acute chest pain , increased SOB and new Afib with RVR. CC revealed 99% Left main and he was taken urgently to CABGx 3 per CVTS 10/17 1730 and presented to SICU 2230. Early am he developed refractory hypoxia and PCCM was asked to manage vent. Note CI was > 4. Vent changes were made with improve oxygenation.  SUBJECTIVE:  Remains on pressors, IABP, high PEEP and FiO2.  VITAL SIGNS: BP 94/64   Pulse (!) 57   Temp 98.1 F (36.7 C)   Resp (!) 24   Ht 5\' 6"  (1.676 m)   Wt 240 lb 4.8 oz (109 kg)   SpO2 90%   BMI 38.79 kg/m   HEMODYNAMICS: PAP: (36-55)/(20-34) 40/25 CVP:  [9 mmHg-29 mmHg] 14 mmHg CO:  [5.4 L/min-10.4 L/min] 6.3 L/min CI:  [2.6 L/min/m2-4.9 L/min/m2] 3 L/min/m2  VENTILATOR SETTINGS: Vent Mode: PRVC FiO2 (%):  [40 %-100 %] 100 % Set Rate:  [24 bmp-30 bmp] 24 bmp Vt Set:  [510 mL] 510 mL PEEP:  [10 cmH20] 10 cmH20 Plateau Pressure:  [21 cmH20] 21 cmH20  INTAKE / OUTPUT: I/O last 3 completed shifts: In: 12705.8 [I.V.:9437.8; Blood:1088; NG/GT:30; IV [id number] Out: [id number]; Emesis/NG output:500; Blood:1000; Chest Tube:770]  PHYSICAL EXAMINATION: General: ill appearing Neuro: RASS -3 HEENT: ETT in place Cardiac: irregular Chest: no wheeze, faint b/l crackles Abd: soft, decreased BS Ext: 1+ edema Skin: no rashes  LABS:  BMET  Recent Labs Lab 10/26/15 0605  10/27/15 0448 10/27/15 1649 10/27/15 1650 10/28/15 0300  NA 140  < > 139 141  --  137  K 4.4  < > 3.5 3.7  --  3.8  CL 110  <  > 107 102  --  106  CO2 24  --  23  --   --  26  BUN 31*  < > 26* 31*  --  27*  CREATININE 1.57*  < > 1.83* 2.00* 1.97* 1.96*  GLUCOSE 104*  < > 219* 105*  --  120*  < > = values in this interval not displayed.  Electrolytes  Recent Labs Lab 10/26/15 0605 10/27/15 0448 10/27/15 1650 10/28/15 0300  CALCIUM 8.5* 8.4*  --  7.9*  MG  --  2.4 2.0  --     CBC  Recent Labs Lab 10/27/15 0448 10/27/15 1649 10/27/15 1650 10/28/15 0300  WBC 26.9*  --  21.6* 23.2*  HGB 8.9* 8.8* 8.9* 9.1*  HCT 27.2* 26.0* 26.0* 27.0*  PLT 242  --  215 191    Coag's  Recent Labs Lab 10/26/15 0137 10/27/15 0026  APTT  --  36  INR 1.12 1.44    Sepsis Markers  Recent Labs Lab 10/27/15 0830 10/28/15 0300  LATICACIDVEN 4.5* 1.1  PROCALCITON 5.15 5.36    ABG  Recent Labs Lab 10/27/15 2024 10/28/15 0054 10/28/15 0405  PHART 7.490* 7.412 7.392  PCO2ART 34.4 41.3 41.2  PO2ART 73.0* 58.0* 59.0*    Liver Enzymes  Recent Labs Lab 10/28/15 0300  AST 75*  ALT 29  ALKPHOS 34*  BILITOT 0.3  ALBUMIN 3.3*    Cardiac Enzymes  Recent Labs Lab 10/26/15 0137 10/26/15 0605 10/26/15 1311  TROPONINI 0.79* 1.82* 2.11*    Glucose  Recent Labs Lab 10/28/15 0158 10/28/15 0303 10/28/15 0404 10/28/15 0501 10/28/15 0559 10/28/15 0652  GLUCAP 117* 114* 121* 128* 121* 123*    Imaging Dg Chest Port 1 View  Result Date: 10/28/2015 CLINICAL DATA:  Shortness of breath. EXAM: PORTABLE CHEST 1 VIEW COMPARISON:  10/27/2015. FINDINGS: Endotracheal tube, NG tube, Swan-Ganz catheter in stable position. Mediastinal drainage catheter stable position. Bilateral chest tubes in stable position. Aortic balloon pump in stable position. No pneumothorax. Prior CABG. Cardiomegaly with normal pulmonary vascularity. Mild interstitial prominence noted. Mild component congestive heart failure cannot be excluded. Low lung volumes. Small bilateral pleural effusions . IMPRESSION: Lines and tubes  including bilateral chest tube and aortic balloon pump in stable position. No pneumothorax. 2. Prior CABG. Cannot exclude mild congestive heart failure with mild pulmonary interstitial edema small pleural effusions. Low lung volumes. Electronically Signed   By: Marcello Moores  Register   On: 10/28/2015 07:09     STUDIES:    CULTURES: 10/18 sputum>> 10/18 bcx 2>>  ANTIBIOTICS: 10/17 levaquin>> 10/17 Vanc>>  SIGNIFICANT EVENTS: 10/17 present to to hospital via ems with cp 10/17 CC 99% LM 10/17 1745 urgently to OR for CABG x 3 10/18 0600 PCCM consulted for refractory hypoxia  LINES/TUBES: 10/17 OTT>> 10/17 CT's x 3>> 10/17 l rad a line>> 10/17 l fem IABP>> 10/17 R IJ PA cath>>  DISCUSSION: 81 yo with hx of cad and presented 10/17 with NSTEMI, went to cath lab and has 99% left main and required urgent surgery. Hypoxia during the early morning of 10/18 and PCCM called for vent management early am 10/18.  ASSESSMENT / PLAN:  PULMONARY A: Acute hypoxic respiratory failure. Hx of COPD. P:   Full vent support F/u CXR, ABG Pulmicort, duoneb  CARDIOVASCULAR A:  CAD post RCA stent 1988. NSTEMI 10/17 with CC 99% LM. 10/17 CABG x 3 per CVTS. P:  Pressors, inotropes, IABP, post op care per TCTS  RENAL A: AKI. Chronic kidney disease 2 >> baseline creatine 1.20. P:   Monitor renal fx, urine outpt  GASTROINTESTINAL A:   Nutrition. Hx of GERD P:   NPO Protonix for SUP  HEMATOLOGIC A:   Anemia after surgery and critical illness. P:  F/u CBC Transfuse for Hb < 7 or bleeding  INFECTIOUS A:   Concern for HCAP. P:   Continue vancomycin, levaquin  ENDOCRINE A: DM type II. P:   Insulin gtt  NEUROLOGIC A:   Acute metabolic encephalopathy. P:   RASS goal: -2 to -3  CC time 32 minutes.  Chesley Mires, MD [facility] Pulmonary/Critical Care 10/28/2015, 7:29 AM Pager:  [phone] After 3pm call: [phone]

## 2015-10-28 NOTE — Progress Notes (Signed)
10/28/2015 0815 Dr. Halford Chessman paged and made aware of PO2 on ABG this am. No new orders received at this time. Will continue to closely monitor patient.  Lois Slagel, Arville Lime

## 2015-10-28 NOTE — Progress Notes (Signed)
2 Days Post-Op Procedure(s) (LRB): CORONARY ARTERY BYPASS GRAFTING (CABG) x3(LIMA to LA, SVG to OM1, SVG to PDA) with EVH from the left thigh and partial lower leg greater saphenous vein and left internal mammary artery (N/A) TRANSESOPHAGEAL ECHOCARDIOGRAM (TEE) (N/A) INTRA-AORTIC BALLOON PUMP INSERTION size 40cc (Left) Subjective: Hemodynamics better Still needs 100 Fio2 Objective: Vital signs in last 24 hours: Temp:  [97.9 F (36.6 C)-99.9 F (37.7 C)] 98.8 F (37.1 C) (10/19 1300) Pulse Rate:  [36-123] 68 (10/19 1300) Cardiac Rhythm: Atrial fibrillation (10/19 1300) Resp:  [3-30] 24 (10/19 1300) BP: (90-123)/(55-95) 114/70 (10/19 1300) SpO2:  [89 %-99 %] 91 % (10/19 1348) Arterial Line BP: (86-128)/(44-70) 110/56 (10/19 1300) FiO2 (%):  [40 %-100 %] 100 % (10/19 1348) Weight:  [240 lb 4.8 oz (109 kg)] 240 lb 4.8 oz (109 kg) (10/19 0408)  Hemodynamic parameters for last 24 hours: PAP: (36-55)/(20-34) 43/22 CVP:  [9 mmHg-29 mmHg] 12 mmHg CO:  [5.4 L/min-9.8 L/min] 7.4 L/min CI:  [2.6 L/min/m2-4.3 L/min/m2] 3.5 L/min/m2  Intake/Output from previous day: 10/18 0701 - 10/19 0700 In: 4348.5 [I.V.:3948.5; IV Piggyback:400] Out: 2935 [Urine:1925; Emesis/NG output:500; Chest Tube:510] Intake/Output this shift: Total I/O In: 889.1 [I.V.:789.1; IV Piggyback:100] Out: [id number] [Urine:615; Emesis/NG output:150; Chest Tube:110]  Sedated on vent abd soft extrem warm  Lab Results:  Recent Labs  10/27/15 1650 10/28/15 0300  WBC 21.6* 23.2*  HGB 8.9* 9.1*  HCT 26.0* 27.0*  PLT 215 191   BMET:  Recent Labs  10/27/15 0448 10/27/15 1649 10/27/15 1650 10/28/15 0300  NA 139 141  --  137  K 3.5 3.7  --  3.8  CL 107 102  --  106  CO2 23  --   --  26  GLUCOSE 219* 105*  --  120*  BUN 26* 31*  --  27*  CREATININE 1.83* 2.00* 1.97* 1.96*  CALCIUM 8.4*  --   --  7.9*    PT/INR:  Recent Labs  10/27/15 0026  LABPROT 17.7*  INR 1.44   ABG    Component Value Date/Time   PHART 7.413 10/28/2015 1227   HCO3 24.6 10/28/2015 1227   TCO2 26 10/28/2015 1227   ACIDBASEDEF 4.0 (H) 10/27/2015 0845   O2SAT 89.0 10/28/2015 1227   CBG (last 3)   Recent Labs  10/28/15 1044 10/28/15 1143 10/28/15 1226  GLUCAP 137* 139* 118*    Assessment/Plan: S/P Procedure(s) (LRB): CORONARY ARTERY BYPASS GRAFTING (CABG) x3(LIMA to LA, SVG to OM1, SVG to PDA) with EVH from the left thigh and partial lower leg greater saphenous vein and left internal mammary artery (N/A) TRANSESOPHAGEAL ECHOCARDIOGRAM (TEE) (N/A) INTRA-AORTIC BALLOON PUMP INSERTION size 40cc (Left) Wean IABP to 1:3 diuresis  LOS: 3 days    Mark Rivers 10/28/2015

## 2015-10-28 NOTE — Progress Notes (Signed)
Dr. Chase Caller of [facility] updated on pt's 0400 abg hypoxemia and morning xray done. No new orders received at this time, pt is already on 100% FiO2.

## 2015-10-29 ENCOUNTER — Inpatient Hospital Stay (HOSPITAL_COMMUNITY): Payer: Medicare Other

## 2015-10-29 DIAGNOSIS — I2510 Atherosclerotic heart disease of native coronary artery without angina pectoris: Secondary | ICD-10-CM

## 2015-10-29 LAB — BLOOD GAS, ARTERIAL
Acid-Base Excess: 0.8 mmol/L (ref 0.0–2.0)
Acid-Base Excess: 2 mmol/L (ref 0.0–2.0)
Bicarbonate: 25.1 mmol/L (ref 20.0–28.0)
Bicarbonate: 26.1 mmol/L (ref 20.0–28.0)
Drawn by: 252031
FIO2: 100
FIO2: 100
MECHVT: 510 mL
MECHVT: 510 mL
O2 Saturation: 93.9 %
O2 Saturation: 95.3 %
PEEP: 10 cmH2O
PEEP: 10 cmH2O
Patient temperature: 98.6
Patient temperature: 98.6
RATE: 24 resp/min
RATE: 24 resp/min
pCO2 arterial: 40.7 mmHg (ref 32.0–48.0)
pCO2 arterial: 42.3 mmHg (ref 32.0–48.0)
pH, Arterial: 7.392 (ref 7.350–7.450)
pH, Arterial: 7.423 (ref 7.350–7.450)
pO2, Arterial: 70.8 mmHg — ABNORMAL LOW (ref 83.0–108.0)
pO2, Arterial: 81.8 mmHg — ABNORMAL LOW (ref 83.0–108.0)

## 2015-10-29 LAB — GLUCOSE, CAPILLARY
Glucose-Capillary: 100 mg/dL — ABNORMAL HIGH (ref 65–99)
Glucose-Capillary: 108 mg/dL — ABNORMAL HIGH (ref 65–99)
Glucose-Capillary: 109 mg/dL — ABNORMAL HIGH (ref 65–99)
Glucose-Capillary: 110 mg/dL — ABNORMAL HIGH (ref 65–99)
Glucose-Capillary: 110 mg/dL — ABNORMAL HIGH (ref 65–99)
Glucose-Capillary: 112 mg/dL — ABNORMAL HIGH (ref 65–99)
Glucose-Capillary: 114 mg/dL — ABNORMAL HIGH (ref 65–99)
Glucose-Capillary: 117 mg/dL — ABNORMAL HIGH (ref 65–99)
Glucose-Capillary: 118 mg/dL — ABNORMAL HIGH (ref 65–99)
Glucose-Capillary: 121 mg/dL — ABNORMAL HIGH (ref 65–99)
Glucose-Capillary: 123 mg/dL — ABNORMAL HIGH (ref 65–99)
Glucose-Capillary: 125 mg/dL — ABNORMAL HIGH (ref 65–99)
Glucose-Capillary: 138 mg/dL — ABNORMAL HIGH (ref 65–99)
Glucose-Capillary: 160 mg/dL — ABNORMAL HIGH (ref 65–99)
Glucose-Capillary: 163 mg/dL — ABNORMAL HIGH (ref 65–99)
Glucose-Capillary: 168 mg/dL — ABNORMAL HIGH (ref 65–99)
Glucose-Capillary: 184 mg/dL — ABNORMAL HIGH (ref 65–99)
Glucose-Capillary: 79 mg/dL (ref 65–99)

## 2015-10-29 LAB — POCT I-STAT, CHEM 8
BUN: 28 mg/dL — ABNORMAL HIGH (ref 6–20)
Calcium, Ion: 1.17 mmol/L (ref 1.15–1.40)
Chloride: 102 mmol/L (ref 101–111)
Creatinine, Ser: 1.9 mg/dL — ABNORMAL HIGH (ref 0.61–1.24)
Glucose, Bld: 163 mg/dL — ABNORMAL HIGH (ref 65–99)
HCT: 24 % — ABNORMAL LOW (ref 39.0–52.0)
Hemoglobin: 8.2 g/dL — ABNORMAL LOW (ref 13.0–17.0)
Potassium: 4 mmol/L (ref 3.5–5.1)
Sodium: 139 mmol/L (ref 135–145)
TCO2: 24 mmol/L (ref 0–100)

## 2015-10-29 LAB — POCT I-STAT 3, ART BLOOD GAS (G3+)
Acid-Base Excess: 1 mmol/L (ref 0.0–2.0)
Acid-base deficit: 1 mmol/L (ref 0.0–2.0)
Bicarbonate: 25.3 mmol/L (ref 20.0–28.0)
Bicarbonate: 24.7 mmol/L (ref 20.0–28.0)
Bicarbonate: 24.2 mmol/L (ref 20.0–28.0)
Bicarbonate: 24.2 mmol/L (ref 20.0–28.0)
O2 Saturation: 96 %
O2 Saturation: 90 %
O2 Saturation: 95 %
O2 Saturation: 99 %
Patient temperature: 36.8
Patient temperature: 36.4
Patient temperature: 36.5
Patient temperature: 37.2
TCO2: 26 mmol/L (ref 0–100)
TCO2: 26 mmol/L (ref 0–100)
TCO2: 25 mmol/L (ref 0–100)
TCO2: 25 mmol/L (ref 0–100)
pCO2 arterial: 38.9 mmHg (ref 32.0–48.0)
pCO2 arterial: 38.1 mmHg (ref 32.0–48.0)
pCO2 arterial: 39.9 mmHg (ref 32.0–48.0)
pCO2 arterial: 38.3 mmHg (ref 32.0–48.0)
pH, Arterial: 7.421 (ref 7.350–7.450)
pH, Arterial: 7.416 (ref 7.350–7.450)
pH, Arterial: 7.389 (ref 7.350–7.450)
pH, Arterial: 7.41 (ref 7.350–7.450)
pO2, Arterial: 83 mmHg (ref 83.0–108.0)
pO2, Arterial: 56 mmHg — ABNORMAL LOW (ref 83.0–108.0)
pO2, Arterial: 77 mmHg — ABNORMAL LOW (ref 83.0–108.0)
pO2, Arterial: 141 mmHg — ABNORMAL HIGH (ref 83.0–108.0)

## 2015-10-29 LAB — PROCALCITONIN: Procalcitonin: 3.32 ng/mL

## 2015-10-29 LAB — ECHO TEE
AO mean calculated velocity dopler: 145 cm/s
AV Area VTI index: 0.74 cm2/m2
AV Area VTI: 1.79 cm2
AV Area mean vel: 1.51 cm2
AV Mean grad: 10 mmHg
AV Peak grad: 17 mmHg
AV VEL mean LVOT/AV: 0.4
AV area mean vel ind: 0.72 cm2/m2
AV peak Index: 0.85
AV pk vel: 206 cm/s
AV vel: 1.56
Ao pk vel: 0.47 m/s
LVOT SV: 72 mL
LVOT VTI: 19 cm
LVOT area: 3.8 cm2
LVOT diameter: 22 mm
LVOT peak VTI: 0.41 cm
LVOT peak vel: 96.9 cm/s
VTI: 46.4 cm
Valve area index: 0.74
Valve area: 1.56 cm2

## 2015-10-29 LAB — CULTURE, RESPIRATORY
CULTURE: NORMAL
SPECIAL REQUESTS: NORMAL

## 2015-10-29 LAB — COMPREHENSIVE METABOLIC PANEL
ALT: 26 U/L (ref 17–63)
AST: 54 U/L — ABNORMAL HIGH (ref 15–41)
Albumin: 2.7 g/dL — ABNORMAL LOW (ref 3.5–5.0)
Alkaline Phosphatase: 46 U/L (ref 38–126)
Anion gap: 5 (ref 5–15)
BUN: 28 mg/dL — ABNORMAL HIGH (ref 6–20)
CO2: 27 mmol/L (ref 22–32)
Calcium: 7.6 mg/dL — ABNORMAL LOW (ref 8.9–10.3)
Chloride: 106 mmol/L (ref 101–111)
Creatinine, Ser: 2.02 mg/dL — ABNORMAL HIGH (ref 0.61–1.24)
GFR calc Af Amer: 34 mL/min — ABNORMAL LOW (ref >60)
GFR calc non Af Amer: 30 mL/min — ABNORMAL LOW (ref >60)
Glucose, Bld: 114 mg/dL — ABNORMAL HIGH (ref 65–99)
Potassium: 4.1 mmol/L (ref 3.5–5.1)
Sodium: 138 mmol/L (ref 135–145)
Total Bilirubin: 0.4 mg/dL (ref 0.3–1.2)
Total Protein: 4.9 g/dL — ABNORMAL LOW (ref 6.5–8.1)

## 2015-10-29 LAB — PROTIME-INR
INR: 1.52
Prothrombin Time: 18.5 seconds — ABNORMAL HIGH (ref 11.4–15.2)

## 2015-10-29 LAB — CULTURE, RESPIRATORY W GRAM STAIN

## 2015-10-29 LAB — COOXEMETRY PANEL
Carboxyhemoglobin: 1.3 % (ref 0.5–1.5)
Methemoglobin: 0.6 % (ref 0.0–1.5)
O2 Saturation: 70.6 %
Total hemoglobin: 12.9 g/dL (ref 12.0–16.0)

## 2015-10-29 LAB — CBC
HCT: 25.3 % — ABNORMAL LOW (ref 39.0–52.0)
Hemoglobin: 8.4 g/dL — ABNORMAL LOW (ref 13.0–17.0)
MCH: 30.9 pg (ref 26.0–34.0)
MCHC: 33.2 g/dL (ref 30.0–36.0)
MCV: 93 fL (ref 78.0–100.0)
Platelets: 183 10*3/uL (ref 150–400)
RBC: 2.72 MIL/uL — ABNORMAL LOW (ref 4.22–5.81)
RDW: 14.9 % (ref 11.5–15.5)
WBC: 21.1 10*3/uL — ABNORMAL HIGH (ref 4.0–10.5)

## 2015-10-29 LAB — BLOOD PRODUCT ORDER (VERBAL) VERIFICATION

## 2015-10-29 LAB — APTT: aPTT: 45 seconds — ABNORMAL HIGH (ref 24–36)

## 2015-10-29 MED ORDER — VITAL AF 1.2 CAL PO LIQD: 1000.0000 mL | ORAL | Status: DC

## 2015-10-29 MED ORDER — DOPAMINE-DEXTROSE 3.2-5 MG/ML-% IV SOLN
0.0000 ug/kg/min | INTRAVENOUS | Status: DC
  Administered 2015-10-30 – 2015-11-06 (×5): 3 ug/kg/min via INTRAVENOUS
  Filled 2015-10-29 (×5): qty 250

## 2015-10-29 MED ORDER — DOCUSATE SODIUM 50 MG/5ML PO LIQD
200.0000 mg | Freq: Every day | ORAL | Status: DC
  Administered 2015-10-29 – 2015-11-05 (×8): 200 mg via ORAL
  Filled 2015-10-29 (×10): qty 20

## 2015-10-29 MED ORDER — FUROSEMIDE 10 MG/ML IJ SOLN
INTRAMUSCULAR | Status: AC
  Filled 2015-10-29: qty 2

## 2015-10-29 MED ORDER — FAMOTIDINE IN NACL 20-0.9 MG/50ML-% IV SOLN
20.0000 mg | INTRAVENOUS | Status: DC
  Administered 2015-10-29: 20 mg via INTRAVENOUS
  Filled 2015-10-29: qty 50

## 2015-10-29 MED ORDER — ALBUMIN HUMAN 5 % IV SOLN
12.5000 g | Freq: Once | INTRAVENOUS | Status: AC
  Administered 2015-10-29: 12.5 g via INTRAVENOUS

## 2015-10-29 MED ORDER — ORAL CARE MOUTH RINSE
15.0000 mL | OROMUCOSAL | Status: DC
  Administered 2015-10-29 – 2015-11-04 (×62): 15 mL via OROMUCOSAL

## 2015-10-29 MED ORDER — VITAL HIGH PROTEIN PO LIQD
1000.0000 mL | ORAL | Status: DC
  Administered 2015-10-29 – 2015-10-30 (×2): 1000 mL

## 2015-10-29 MED ORDER — FUROSEMIDE 10 MG/ML IJ SOLN
20.0000 mg | Freq: Once | INTRAMUSCULAR | Status: AC
Start: 2015-10-29 — End: 2015-10-29
  Administered 2015-10-29: 20 mg via INTRAVENOUS

## 2015-10-29 MED ORDER — VITAL HIGH PROTEIN PO LIQD: 1000.0000 mL | ORAL | Status: DC

## 2015-10-29 MED ORDER — FUROSEMIDE 10 MG/ML IJ SOLN
40.0000 mg | Freq: Two times a day (BID) | INTRAMUSCULAR | Status: DC
  Administered 2015-10-29 – 2015-10-30 (×2): 40 mg via INTRAVENOUS
  Filled 2015-10-29 (×2): qty 4

## 2015-10-29 MED ORDER — LEVOFLOXACIN IN D5W 750 MG/150ML IV SOLN: 750.0000 mg | INTRAVENOUS | Status: DC

## 2015-10-29 MED ORDER — CALCIUM CHLORIDE 10 % IV SOLN
1.0000 g | Freq: Once | INTRAVENOUS | Status: AC
  Administered 2015-10-29: 1 g via INTRAVENOUS

## 2015-10-29 MED ORDER — INSULIN ASPART 100 UNIT/ML ~~LOC~~ SOLN
0.0000 [IU] | SUBCUTANEOUS | Status: DC
  Administered 2015-10-29 (×2): 4 [IU] via SUBCUTANEOUS
  Administered 2015-10-29: 3 [IU] via SUBCUTANEOUS
  Administered 2015-10-30: 4 [IU] via SUBCUTANEOUS
  Administered 2015-10-30 (×2): 2 [IU] via SUBCUTANEOUS
  Administered 2015-10-30 (×2): 4 [IU] via SUBCUTANEOUS
  Administered 2015-10-30 – 2015-10-31 (×6): 2 [IU] via SUBCUTANEOUS
  Administered 2015-11-01: 4 [IU] via SUBCUTANEOUS
  Administered 2015-11-01 – 2015-11-03 (×7): 2 [IU] via SUBCUTANEOUS
  Administered 2015-11-04 (×2): 4 [IU] via SUBCUTANEOUS
  Administered 2015-11-04 (×2): 2 [IU] via SUBCUTANEOUS
  Administered 2015-11-04: 4 [IU] via SUBCUTANEOUS
  Administered 2015-11-04: 2 [IU] via SUBCUTANEOUS
  Administered 2015-11-04: 8 [IU] via SUBCUTANEOUS
  Administered 2015-11-05 (×2): 4 [IU] via SUBCUTANEOUS
  Administered 2015-11-05 – 2015-11-11 (×30): 2 [IU] via SUBCUTANEOUS
  Administered 2015-11-11: 4 [IU] via SUBCUTANEOUS
  Administered 2015-11-11 – 2015-11-13 (×7): 2 [IU] via SUBCUTANEOUS
  Administered 2015-11-13: 4 [IU] via SUBCUTANEOUS
  Administered 2015-11-13 – 2015-11-16 (×9): 2 [IU] via SUBCUTANEOUS
  Administered 2015-11-16: 4 [IU] via SUBCUTANEOUS
  Administered 2015-11-16 – 2015-11-22 (×27): 2 [IU] via SUBCUTANEOUS
  Administered 2015-11-23 (×2): 4 [IU] via SUBCUTANEOUS
  Administered 2015-11-23 – 2015-11-24 (×6): 2 [IU] via SUBCUTANEOUS
  Administered 2015-11-24: 4 [IU] via SUBCUTANEOUS
  Administered 2015-11-24: 2 [IU] via SUBCUTANEOUS
  Administered 2015-11-25: 4 [IU] via SUBCUTANEOUS
  Administered 2015-11-25 – 2015-11-26 (×3): 2 [IU] via SUBCUTANEOUS
  Administered 2015-11-26: 4 [IU] via SUBCUTANEOUS
  Administered 2015-11-26: 2 [IU] via SUBCUTANEOUS

## 2015-10-29 MED ORDER — LEVOFLOXACIN IN D5W 500 MG/100ML IV SOLN
500.0000 mg | INTRAVENOUS | Status: AC
  Administered 2015-10-29 – 2015-11-01 (×4): 500 mg via INTRAVENOUS
  Filled 2015-10-29 (×4): qty 100

## 2015-10-29 MED ORDER — JEVITY 1.2 CAL PO LIQD
ORAL | Status: DC
  Administered 2015-10-29: 12:00:00
  Filled 2015-10-29 (×2): qty 1000

## 2015-10-29 MED ORDER — EPINEPHRINE PF 1 MG/ML IJ SOLN
3.0000 ug/min | INTRAVENOUS | Status: DC
  Filled 2015-10-29: qty 4

## 2015-10-29 MED ORDER — INSULIN DETEMIR 100 UNIT/ML ~~LOC~~ SOLN
12.0000 [IU] | Freq: Two times a day (BID) | SUBCUTANEOUS | Status: DC
  Administered 2015-10-29 – 2015-11-01 (×8): 12 [IU] via SUBCUTANEOUS
  Filled 2015-10-29 (×10): qty 0.12

## 2015-10-29 MED ORDER — ENOXAPARIN SODIUM 40 MG/0.4ML ~~LOC~~ SOLN
40.0000 mg | SUBCUTANEOUS | Status: DC
  Administered 2015-10-29 – 2015-11-01 (×4): 40 mg via SUBCUTANEOUS
  Filled 2015-10-29 (×4): qty 0.4

## 2015-10-29 MED ORDER — FAMOTIDINE IN NACL 20-0.9 MG/50ML-% IV SOLN: 20.0000 mg | Freq: Two times a day (BID) | INTRAVENOUS | Status: DC

## 2015-10-29 NOTE — Op Note (Signed)
Mark Rivers, Mark Rivers NO.:  [id number]  MEDICAL RECORD NO.:  [id number]  LOCATION:  2S03C                        FACILITY:  [facility]  PHYSICIAN:  Ivin Poot, M.D.  DATE OF BIRTH:  1934-06-28  DATE OF PROCEDURE:  10/25/2015 DATE OF DISCHARGE:                              OPERATIVE REPORT   OPERATIONS: 1. Emergency coronary artery bypass grafting x3 (left internal mammary     artery to LAD, saphenous vein graft to OM2, saphenous vein graft to     posterior descending). 2. Placement of intra-aortic balloon pump via left femoral artery. 3. Endoscopic harvest of left leg greater saphenous vein.  SURGEON:  Ivin Poot, MD.  ASSISTANT:  Lars Pinks, PA-C  PREOPERATIVE DIAGNOSES: 1. Non ST elevation MI with unstable angina. 2. Critical 99% left main stenosis with moderate LV dysfunction.  POSTOPERATIVE DIAGNOSES: 1. Non ST elevation MI with unstable angina. 2. Critical 99% left main stenosis with moderate LV dysfunction.  ANESTHESIA:  General by Dr. Oleta Mouse.  INDICATIONS:  The patient is a 81 year old obese male who presented to the hospital with severe chest pains of several days duration.  Cardiac enzymes were positive.  The patient was added to the cath schedule at the end of the day.  He was found to have a 99% stenosis of the left main with clot present in the left main.  Dr. Claiborne Billings, his cardiologist recommended emergency heart bypass surgery.  I examined the patient in the cath lab with Dr. Georgina Peer.  We made the decision to proceed with emergency CABG through this coordination of care to optimize the patient's outcome.  The patient was maintained on IV heparin while the operating room was being prepared and the anesthesia staff was being organized.  The patient was then directly taken to the operating room.  Prior to surgery, I discussed the procedure of emergency CABG with the patient in the cath lab.  He understood the  indications, benefits, the associated risks of stroke, death, MI, bleeding, blood transfusion, and infection.  He has agreed to proceed.  The patient had no family members present.  OPERATIVE FINDINGS: 1. Critical coronary disease, but with adequate targets. 2. Adequate conduit. 3. Sudden severe hypotension after protamine with no initial     abnormalities of LV function or EKG, but with further uncorrected     hypertension.  The patient developed severe cardiac dysfunction     requiring balloon pump and then reversal of hemodynamic     instability.  OPERATIVE PROCEDURE:  The patient was brought to the operating room from the cath lab directly.  The invasive monitoring lines were placed.  The patient was intubated.  A proper time-out was performed.  The echo probe had been placed by the Anesthesia team.  The chest, abdomen, and legs were prepped with Betadine and draped as a sterile field.  A sternal incision was made as the saphenous vein was harvested endoscopically from the left leg.  The patient was severely obese and there was large amounts of fat in the patient's chest and subcutaneous region.  The mammary artery; however, was adequate at 1.5 mm with good flow.  The sternal retractor was placed with  the deep blades.  The pericardium was opened and suspended.  Exposure of the heart was difficult due to the patient's obese body habitus.  When the heparin was administered, the patient was placed on cardiopulmonary bypass through pursestrings placed in the ascending aorta and right atrium.  The coronaries were identified for grafting and the mammary artery and vein grafts were prepared for the distal anastomoses.  Cardioplegia cannulas were placed for antegrade and retrograde cold blood cardioplegia and the patient was cooled to 32 degrees.  The aortic crossclamp was applied and a liter of cold blood cardioplegia was delivered in split doses between the antegrade aortic and  retrograde coronary sinus catheters.  There was good cardioplegic arrest and septal temperature dropped less than 14 degrees.  Cardioplegia was delivered every 20 minutes.  The distal coronary anastomoses were performed.  The first distal anastomosis was to the posterior descending.  This was a 1.5-mm vessel with proximal 80% stenosis.  A reverse saphenous vein was sewn end-to- side with running 7-0 Prolene with good flow through the graft. Cardioplegia was redosed.  The second distal anastomosis was to the OM2 branch of the left circumflex.  This communicated freely with the OM 1 branch.  The OM2 was a 1.5-mm vessel with proximal 99% left main stenosis.  A reverse saphenous vein was sewn end-to-side with running 7-0 Prolene with good flow through the graft.  Cardioplegia was redosed.  The third distal anastomosis was to the midportion of the LAD.  This was 1.5-mm vessel which was heavily calcified proximally and had a proximal 99% left main stenosis.  The left IMA pedicle was brought through an opening and the left lateral pericardium was brought down onto the LAD and sewn end-to-side with running 8-0 Prolene.  There was good flow through the anastomosis after briefly releasing the pedicle bulldog on the mammary artery.  The bulldog was reapplied and the pedicle was secured to the epicardium.  Cardioplegia was redosed.  The crossclamp was still in place, 2 proximal vein anastomoses were performed on the ascending aorta with a 4.5 mm punch and running 6-0 Prolene.  Prior to removing the crossclamp, air was vented from the coronaries and a dose of retrograde warm blood cardioplegia.  We also used CO2 in the operative field to reduce entrapment of air in the heart. The cross-clamp was then removed and the heart resumed a spontaneous rhythm.  The bypass grafts were checked and found to be with good flow and hemostasis was documented at the proximal distal anastomoses. Temporary pacing  wires were applied.  The patient was rewarmed and reperfused.  The lungs were expanded.  The ventilator was resumed.  The patient was weaned off cardiopulmonary bypass without difficulty.  Echo function looked preserved.  There was no significant valvular disease. Protamine was delivered slowly.  The patient remained stable.  However, approximately 5-10 minutes after full protamine dose prior to closing the chest, the patient developed hypotension with a blood pressure to 60.  Measures were taken by the Anesthesia team to correct this. However, the measures did not appear to be working.  Heart appeared to be fairly dynamic and there were no EKG changes of ischemia.  The echo again showed baseline mild-moderate LV dysfunction.  With the patient having further hypotension, I placed a left femoral A- line and then through this, placed a 40 mL balloon intra-aortic balloon pump.  This was directed to the appropriate location in the proximal descending aorta using echo guidance.  After initiating  counterpulsation with balloon pump for only 30 seconds, the blood pressure immediately increased to normal range.  The drips were then adjusted by the Anesthesia team.  We sat and observed the heart carefully.  Blood pressure was excellent.  Echo showed return to baseline LV function.  I did not feel that going back on bypass would be beneficial.  We stayed and watched the patient for significant amount of time and he remained stable.  The amount of pressor support decreased dramatically after placement of balloon pump.  We then proceeded to close the chest.  The superior pericardium was closed.  The chest tubes were placed and brought out through separate incisions.  The sternum and chest incision were irrigated.  The sternum was closed with wire and the patient remained stable.  The pectoralis fascia was closed with a running 0 Vicryl and the subcutaneous and skin were closed running Vicryl.   Total cardiopulmonary bypass time was 110 minutes.  The patient received 1 unit of packed cells in the operative time due to preoperative anemia.  The patient returned to the ICU with balloon pump in critical but stable condition.     Ivin Poot, M.D.     PV/MEDQ  D:  10/28/2015  T:  10/29/2015  Job:  [id number]

## 2015-10-29 NOTE — Progress Notes (Signed)
Pending clearance from renal MD to place PICC. Catalina Pizza

## 2015-10-29 NOTE — Progress Notes (Signed)
TCTS BRIEF SICU PROGRESS NOTE  3 Days Post-Op  S/P Procedure(s) (LRB): CORONARY ARTERY BYPASS GRAFTING (CABG) x3(LIMA to LA, SVG to OM1, SVG to PDA) with EVH from the left thigh and partial lower leg greater saphenous vein and left internal mammary artery (N/A) TRANSESOPHAGEAL ECHOCARDIOGRAM (TEE) (N/A) INTRA-AORTIC BALLOON PUMP INSERTION size 40cc (Left)   Stable day IABP removed uneventfully Afib w/ stable HR, stable hemodynamics on dopamine, Epi, milrinone and levophed drips O2 sats 100% on 100% FiO2 PEEP 10 UOP 75-100 mL/hr Labs stable  Plan: Continue current plan  Rexene Alberts, MD 10/29/2015 8:47 PM

## 2015-10-29 NOTE — Care Management Important Message (Signed)
Important Message  Patient Details  Name: Dometrius Dufour MRN: [id number] Date of Birth: Feb 26, 1934   Medicare Important Message Given:  Yes    Cinzia Devos Abena 10/29/2015, 11:08 AM

## 2015-10-29 NOTE — Progress Notes (Signed)
PULMONARY / CRITICAL CARE MEDICINE   Name: Mark Rivers MRN: [id number] DOB: 07/18/1934    ADMISSION DATE:  10/25/2015 CONSULTATION DATE:  10/27/2015  REFERRING MD:  CVTS  CHIEF COMPLAINT:  HYPOXIA  HISTORY OF PRESENT ILLNESS:   81 yo male presented with increasing dyspnea and a fib with RVR.  Found to have 99% Lt main CAD and had emergent CABG 10/17.  Developed refractory hypoxia post-op and PCCM consulted.  PMHx of CAD, HTN, DM, GERD, CKD (baseline creatinine 1.2), COPD with emphysema (DLCO 64% from April 2015)  SUBJECTIVE:  Remains on pressors, IABP, high PEEP and FiO2.  VITAL SIGNS: BP 105/84 (BP Location: Right Arm)   Pulse (!) 34   Temp 98.2 F (36.8 C)   Resp (!) 24   Ht 5\' 6"  (1.676 m)   Wt 225 lb 15.5 oz (102.5 kg)   SpO2 95%   BMI 36.47 kg/m   HEMODYNAMICS: PAP: (37-52)/(20-31) 43/21 CVP:  [9 mmHg-24 mmHg] 13 mmHg CO:  [5.6 L/min-7.4 L/min] 6.4 L/min CI:  [2.7 L/min/m2-3.5 L/min/m2] 3 L/min/m2  VENTILATOR SETTINGS: Vent Mode: PRVC FiO2 (%):  [80 %-100 %] 80 % Set Rate:  [24 bmp] 24 bmp Vt Set:  [510 mL] 510 mL PEEP:  [10 cmH20] 10 cmH20 Plateau Pressure:  [17 cmH20-22 cmH20] 22 cmH20  INTAKE / OUTPUT: I/O last 3 completed shifts: In: 5787.9 [I.V.:5467.9; NG/GT:220; IV Piggyback:100] Out: [id number] [Urine:3720; Emesis/NG output:700; Chest Tube:670]  PHYSICAL EXAMINATION: General: ill appearing Neuro: RASS -3 HEENT: ETT in place Cardiac: irregular Chest: no wheeze, faint b/l crackles Abd: soft, decreased BS Ext: 1+ edema Skin: no rashes  LABS:  BMET  Recent Labs Lab 10/27/15 0448  10/28/15 0300 10/28/15 1602 10/29/15 0500  NA 139  < > 137 140 138  K 3.5  < > 3.8 4.1 4.1  CL 107  < > 106 102 106  CO2 23  --  26  --  27  BUN 26*  < > 27* 28* 28*  CREATININE 1.83*  < > 1.96* 1.90* 2.02*  GLUCOSE 219*  < > 120* 132* 114*  < > = values in this interval not displayed.  Electrolytes  Recent Labs Lab 10/27/15 0448 10/27/15 1650  10/28/15 0300 10/29/15 0500  CALCIUM 8.4*  --  7.9* 7.6*  MG 2.4 2.0  --   --     CBC  Recent Labs Lab 10/27/15 1650 10/28/15 0300 10/28/15 1602 10/29/15 0500  WBC 21.6* 23.2*  --  21.1*  HGB 8.9* 9.1* 8.5* 8.4*  HCT 26.0* 27.0* 25.0* 25.3*  PLT 215 191  --  183    Coag's  Recent Labs Lab 10/26/15 0137 10/27/15 0026 10/29/15 0500  APTT  --  36 45*  INR 1.12 1.44 1.52    Sepsis Markers  Recent Labs Lab 10/27/15 0830 10/28/15 0300 10/29/15 0500  LATICACIDVEN 4.5* 1.1  --   PROCALCITON 5.15 5.36 3.32    ABG  Recent Labs Lab 10/29/15 0026 10/29/15 0455 10/29/15 0758  PHART 7.423 7.392 7.421  PCO2ART 40.7 42.3 38.9  PO2ART 70.8* 81.8* 83.0    Liver Enzymes  Recent Labs Lab 10/28/15 0300 10/29/15 0500  AST 75* 54*  ALT 29 26  ALKPHOS 34* 46  BILITOT 0.3 0.4  ALBUMIN 3.3* 2.7*    Cardiac Enzymes  Recent Labs Lab 10/26/15 0137 10/26/15 0605 10/26/15 1311  TROPONINI 0.79* 1.82* 2.11*    Glucose  Recent Labs Lab 10/29/15 0257 10/29/15 0404 10/29/15 0504 10/29/15 [id number] 10/29/15 [id number]  10/29/15 0756  GLUCAP 118* 123* 117* 114* 121* 110*    Imaging Dg Chest Port 1 View  Result Date: 10/29/2015 CLINICAL DATA:  Respiratory failure, chest tube, CABG, hypertension, diabetes mellitus EXAM: PORTABLE CHEST 1 VIEW COMPARISON:  Portable exam 0547 hours compared to 10/28/2015 FINDINGS: Tip of endotracheal tube projects 4.9 cm above carina. Nasogastric tube extends into stomach. Mediastinal drain and LEFT thoracostomy tube stable. RIGHT jugular Swan-Ganz catheter tip projects over bifurcation of main pulmonary artery. Normal heart size post CABG. Bibasilar effusions and atelectasis. Questionable perihilar edema. No pneumothorax IMPRESSION: Bibasilar atelectasis and pleural effusions with question mild perihilar edema. Line and tube positions as above. Electronically Signed   By: Lavonia Dana M.D.   On: 10/29/2015 08:41     STUDIES:    CULTURES: 10/18 sputum>> 10/18 bcx 2>>  ANTIBIOTICS: 10/17 levaquin >> 10/17 Vancomycin >> 10/18  SIGNIFICANT EVENTS: 10/17 Admit, cardiac cath, emergent CABG 10/18 0600 PCCM consulted for refractory hypoxia  LINES/TUBES: 10/17 ETT>> 10/17 CT's x 3>> 10/17 Lt rad a line>> 10/17 Lt fem IABP>> 10/17 Rt IJ PA cath>>  DISCUSSION: 81 yo with hx of cad and presented 10/17 with NSTEMI, went to cath lab and has 99% left main and required urgent surgery. Hypoxia during the early morning of 10/18 and PCCM called for vent management early am 10/18.  ASSESSMENT / PLAN:  PULMONARY A: Acute hypoxic respiratory failure. Hx of COPD. P:   Full vent support F/u CXR, ABG Pulmicort, duoneb  CARDIOVASCULAR A:  CAD post RCA stent 1988. NSTEMI 10/17 with CC 99% LM. 10/17 CABG x 3 per CVTS. P:  Pressors, inotropes, IABP, post op care per TCTS  RENAL A: AKI. Chronic kidney disease 2 >> baseline creatine 1.20. P:   Monitor renal fx, urine outpt  GASTROINTESTINAL A:   Nutrition. Hx of GERD P:   NPO Protonix for SUP  HEMATOLOGIC A:   Anemia after surgery and critical illness. P:  F/u CBC Transfuse for Hb < 7 or bleeding  INFECTIOUS A:   Concern for HCAP. P:   Continue levaquin  ENDOCRINE A: DM type II. P:   Insulin gtt  NEUROLOGIC A:   Acute metabolic encephalopathy. P:   RASS goal: -2 to -3  CC time 31 minutes.  Chesley Mires, MD [facility] Pulmonary/Critical Care 10/29/2015, 9:03 AM Pager:  [phone] After 3pm call: [phone]

## 2015-10-29 NOTE — Progress Notes (Signed)
Dr. Prescott Gum at bedside to remove IABP. Pt. Tolerated procedure well. Pressure held x 45 minutes. Pressure dressing applied to site. Will continue to closely monitor patient.  Nabiha Planck, Arville Lime

## 2015-10-29 NOTE — Op Note (Signed)
CT surgery procedure note  Procedure-removal of intra-aortic balloon pump -left femoral artery Surgeon Tharon Aquas trigt M.D. Diagnosis status post emergency CABG for non-ST elevation MI, LV dysfunction requiring balloon pump placed in OR  The patient was sedated while on the ventilator Proper timeout was performed The procedure was discussed with the patient's brother immediately before we started The balloon pump was aspirated of gas pulled the sheath and the sheath and balloon pump were pulled together. There is no resistance. I personally held hand pressure on the femoral artery site 25 minutes followed by the RN for another 10 minutes. A good Doppler signal was present in the left foot the entire procedure.

## 2015-10-29 NOTE — Progress Notes (Signed)
Pharmacy Antibiotic Note  Mark Rivers is a 81 y.o. male admitted on 10/25/2015 with pneumonia.  Pharmacy has been consulted for levofloxacin dosing.  Plan: Levofloxacin 500 mg q24h F/u cx, renal fx  Height: 5\' 6"  (167.6 cm) Weight: 225 lb 15.5 oz (102.5 kg) IBW/kg (Calculated) : 63.8  Temp (24hrs), Avg:99.3 F (37.4 C), Min:98.1 F (36.7 C), Max:100.2 F (37.9 C)   Recent Labs Lab 10/27/15 0026 10/27/15 0448 10/27/15 0830 10/27/15 1649 10/27/15 1650 10/28/15 0300 10/28/15 1602 10/29/15 0500  WBC 24.9* 26.9*  --   --  21.6* 23.2*  --  21.1*  CREATININE  --  1.83*  --  2.00* 1.97* 1.96* 1.90* 2.02*  LATICACIDVEN  --   --  4.5*  --   --  1.1  --   --     Estimated Creatinine Clearance: 33.3 mL/min (by C-G formula based on SCr of 2.02 mg/dL (H)).    Allergies  Allergen Reactions  . Amoxicillin Other (See Comments)    PATIENT PASSES OUT!!!!  . Cephalexin Other (See Comments)    PATIENT PASSES OUT!!!!  . Penicillin G Other (See Comments)    Has patient had a PCN reaction causing immediate rash, facial/tongue/throat swelling, SOB or lightheadedness with hypotension: Yes Has patient had a PCN reaction causing severe rash involving mucus membranes or skin necrosis: No Has patient had a PCN reaction that required hospitalization: Yes Has patient had a PCN reaction occurring within the last 10 years: Yes If all of the above answers are "NO", then may proceed with Cephalosporin use. PATIENT PASSES OUT!!!!    Antimicrobials this admission: Levofloxacin 10/17>> Vancomycin 10/17>>10/18  Dose adjustments this admission: N/A  Microbiology results: 10/18 MRSA PCR neg 10/18 Resp pending 10/19 Resp pending 10/18 Blood x 2 neg  Levester Fresh, PharmD, BCPS, [facility] Clinical Pharmacist Pager [phone] 10/29/2015 9:33 AM

## 2015-10-29 NOTE — Progress Notes (Signed)
3 Days Post-Op Procedure(s) (LRB): CORONARY ARTERY BYPASS GRAFTING (CABG) x3(LIMA to LA, SVG to OM1, SVG to PDA) with EVH from the left thigh and partial lower leg greater saphenous vein and left internal mammary artery (N/A) TRANSESOPHAGEAL ECHOCARDIOGRAM (TEE) (N/A) INTRA-AORTIC BALLOON PUMP INSERTION size 40cc (Left) Subjective: Emergency CABG 3 for non-STEMI with 99% left main with thrombus and LV dysfunction. Probable chronic atrial fibrillation Postop balloon pump for low blood pressure Postoperative acute respiratory insufficiency on ventilator followed by CCM Patient had balloon pump removed today with stable hemodynamics cardiac index 3.0 Patient remains ventilator dependent because of low oxygenation Bilateral pleural tubes an empty to remain Patient with increased airway secretions elevated white count-started empirically on Levaquin. Patient allergic to penicillin and cephalosporin Objective: Vital signs in last 24 hours: Temp:  [97.5 F (36.4 C)-100.2 F (37.9 C)] 97.5 F (36.4 C) (10/20 1230) Pulse Rate:  [29-124] 83 (10/20 1230) Cardiac Rhythm: Atrial fibrillation (10/20 1200) Resp:  [0-27] 24 (10/20 1230) BP: (83-120)/(48-84) 114/63 (10/20 1200) SpO2:  [89 %-100 %] 97 % (10/20 1231) Arterial Line BP: (67-144)/(38-77) 110/51 (10/20 1230) FiO2 (%):  [70 %-100 %] 100 % (10/20 1231) Weight:  [225 lb 15.5 oz (102.5 kg)] 225 lb 15.5 oz (102.5 kg) (10/20 0500)  Hemodynamic parameters for last 24 hours: PAP: (39-49)/(20-31) 42/22 CVP:  [9 mmHg-19 mmHg] 13 mmHg CO:  [5.6 L/min-6.4 L/min] 6.4 L/min CI:  [2.7 L/min/m2-3 L/min/m2] 3 L/min/m2  Intake/Output from previous day: 10/19 0701 - 10/20 0700 In: 4052.6 [I.V.:3732.6; NG/GT:220; IV Piggyback:100] Out: 3470 [Urine:2570; Emesis/NG output:500; Chest Tube:400] Intake/Output this shift: Total I/O In: 1303.6 [I.V.:773.6; NG/GT:130; IV Piggyback:400] Out: 1275 [Urine:1035; Emesis/NG output:200; Chest Tube:40]       Physical Exam  General: Sedated, intubated  Lungs with coarse breath sounds Abdomen distended soft Extremities with edema Good peripheral Doppler pulses Patient responds to questions and moves all extremities  Recent Labs  10/28/15 0300 10/28/15 1602 10/29/15 0500  WBC 23.2*  --  21.1*  HGB 9.1* 8.5* 8.4*  HCT 27.0* 25.0* 25.3*  PLT 191  --  183   BMET:  Recent Labs  10/28/15 0300 10/28/15 1602 10/29/15 0500  NA 137 140 138  K 3.8 4.1 4.1  CL 106 102 106  CO2 26  --  27  GLUCOSE 120* 132* 114*  BUN 27* 28* 28*  CREATININE 1.96* 1.90* 2.02*  CALCIUM 7.9*  --  7.6*    PT/INR:  Recent Labs  10/29/15 0500  LABPROT 18.5*  INR 1.52   ABG    Component Value Date/Time   PHART 7.416 10/29/2015 1158   HCO3 24.7 10/29/2015 1158   TCO2 26 10/29/2015 1158   ACIDBASEDEF 4.0 (H) 10/27/2015 0845   O2SAT 90.0 10/29/2015 1158   CBG (last 3)   Recent Labs  10/29/15 1046 10/29/15 1144 10/29/15 1252  GLUCAP 108* 112* 138*    Assessment/Plan: S/P Procedure(s) (LRB): CORONARY ARTERY BYPASS GRAFTING (CABG) x3(LIMA to LA, SVG to OM1, SVG to PDA) with EVH from the left thigh and partial lower leg greater saphenous vein and left internal mammary artery (N/A) TRANSESOPHAGEAL ECHOCARDIOGRAM (TEE) (N/A) INTRA-AORTIC BALLOON PUMP INSERTION size 40cc (Left) Removed balloon pump Place feeding tube and start tube feeds Tracheal aspirate with white cells and organisms-start empiric antibiotics Lovenox for DVT prophylaxis Wean FiO2 as tolerated Patient currently on low-dose epi, nor epi, milrinone and renal dose dopamine Patient presented in atrial fibrillation and remains on IV amiodarone for rate control  LOS: 4 days  Mark Rivers 10/29/2015

## 2015-10-29 NOTE — Progress Notes (Addendum)
Nutrition Follow Up  DOCUMENTATION CODES:   Obesity unspecified  INTERVENTION:    Initiate Vital High Protein at goal rate of 65 ml/h (1560 ml per day) to provide 1560 kcals, 136 gm protein, 1304 ml free water daily.  NUTRITION DIAGNOSIS:   Inadequate oral intake related to inability to eat as evidenced by NPO status, ongoing  GOAL:   Provide needs based on ASPEN/SCCM guidelines, progressing  MONITOR:   TF tolerance, Vent status, Labs, Weight trends, Skin, I & O's  ASSESSMENT:   81 yo WM with PMH significant for but not limited to, CAD with RCA stent 1988, IDDM,HTN, GERD; who has had increasing DOE for 3 weeks prior to admit. 10/16 he presented with acute chest pain, increased SOB and new Afib with RVR. CC revealed 99% Left main and he was taken urgently to CABGx 3 per CVTS 10/17 1730 and presented to SICU 2230.   Patient s/p procedure 10/16: CORONARY ARTERY BYPASS GRAFTING (CABG) x 3  INTRA-AORTIC BALLOON PUMP INSERTION  Patient is currently intubated on ventilator support Temp (24hrs), Avg:99.2 F (37.3 C), Min:97.5 F (36.4 C), Max:100.2 F (37.9 C)  Pt presented 10/17 with NSTEMI >> required urgent surgery. Developed severe hypoxia in AM 10/18. CORTRAK feeding tube placed today >> tip in duodenum. Verbal with readback order received per Dr. Prescott Gum to manage TF. Labs and medications reviewed.  Diet Order:  Diet NPO time specified   CBG (last 3)   Recent Labs  10/29/15 0958 10/29/15 1046 10/29/15 1144  GLUCAP 100* 108* 112*   Skin:  Reviewed, no issues  Last BM:  10/17  Height:   Ht Readings from Last 1 Encounters:  10/27/15 5\' 6"  (1.676 m)    Weight:   Wt Readings from Last 1 Encounters:  10/29/15 225 lb 15.5 oz (102.5 kg)    Ideal Body Weight:  59 kg  BMI:  Body mass index is 36.47 kg/m.  Estimated Nutritional Needs:   Kcal:  [id number]  Protein:  130-140 gm  Fluid:  per MD  EDUCATION NEEDS:   No education needs identified at  this time  [name], RD, LDN Pager #: [phone] After-Hours Pager #: [phone]

## 2015-10-30 ENCOUNTER — Inpatient Hospital Stay (HOSPITAL_COMMUNITY): Payer: Medicare Other

## 2015-10-30 LAB — CULTURE, RESPIRATORY W GRAM STAIN
Culture: NORMAL
Special Requests: NORMAL

## 2015-10-30 LAB — GLUCOSE, CAPILLARY
Glucose-Capillary: 133 mg/dL — ABNORMAL HIGH (ref 65–99)
Glucose-Capillary: 134 mg/dL — ABNORMAL HIGH (ref 65–99)
Glucose-Capillary: 137 mg/dL — ABNORMAL HIGH (ref 65–99)
Glucose-Capillary: 165 mg/dL — ABNORMAL HIGH (ref 65–99)
Glucose-Capillary: 168 mg/dL — ABNORMAL HIGH (ref 65–99)
Glucose-Capillary: 180 mg/dL — ABNORMAL HIGH (ref 65–99)

## 2015-10-30 LAB — POCT I-STAT 3, ART BLOOD GAS (G3+)
Acid-Base Excess: 1 mmol/L (ref 0.0–2.0)
Acid-Base Excess: 1 mmol/L (ref 0.0–2.0)
Bicarbonate: 25.7 mmol/L (ref 20.0–28.0)
Bicarbonate: 26.5 mmol/L (ref 20.0–28.0)
Bicarbonate: 27.5 mmol/L (ref 20.0–28.0)
O2 Saturation: 95 %
O2 Saturation: 91 %
O2 Saturation: 95 %
Patient temperature: 37.5
Patient temperature: 37.9
Patient temperature: 37.8
TCO2: 27 mmol/L (ref 0–100)
TCO2: 28 mmol/L (ref 0–100)
TCO2: 29 mmol/L (ref 0–100)
pCO2 arterial: 43.2 mmHg (ref 32.0–48.0)
pCO2 arterial: 45.4 mmHg (ref 32.0–48.0)
pCO2 arterial: 60.7 mmHg — ABNORMAL HIGH (ref 32.0–48.0)
pH, Arterial: 7.385 (ref 7.350–7.450)
pH, Arterial: 7.378 (ref 7.350–7.450)
pH, Arterial: 7.268 — ABNORMAL LOW (ref 7.350–7.450)
pO2, Arterial: 77 mmHg — ABNORMAL LOW (ref 83.0–108.0)
pO2, Arterial: 65 mmHg — ABNORMAL LOW (ref 83.0–108.0)
pO2, Arterial: 89 mmHg (ref 83.0–108.0)

## 2015-10-30 LAB — CBC
HCT: 22.2 % — ABNORMAL LOW (ref 39.0–52.0)
HCT: 25.4 % — ABNORMAL LOW (ref 39.0–52.0)
HEMOGLOBIN: 8.5 g/dL — AB (ref 13.0–17.0)
Hemoglobin: 7.5 g/dL — ABNORMAL LOW (ref 13.0–17.0)
MCH: 31.1 pg (ref 26.0–34.0)
MCH: 30.1 pg (ref 26.0–34.0)
MCHC: 33.8 g/dL (ref 30.0–36.0)
MCHC: 33.5 g/dL (ref 30.0–36.0)
MCV: 92.1 fL (ref 78.0–100.0)
MCV: 90.1 fL (ref 78.0–100.0)
Platelets: 136 10*3/uL — ABNORMAL LOW (ref 150–400)
Platelets: 138 10*3/uL — ABNORMAL LOW (ref 150–400)
RBC: 2.41 MIL/uL — ABNORMAL LOW (ref 4.22–5.81)
RBC: 2.82 MIL/uL — AB (ref 4.22–5.81)
RDW: 14.7 % (ref 11.5–15.5)
RDW: 15.4 % (ref 11.5–15.5)
WBC: 14.8 10*3/uL — ABNORMAL HIGH (ref 4.0–10.5)
WBC: 16.1 10*3/uL — ABNORMAL HIGH (ref 4.0–10.5)

## 2015-10-30 LAB — TYPE AND SCREEN
ABO/RH(D): O POS
Antibody Screen: NEGATIVE
Unit division: 0
Unit division: 0

## 2015-10-30 LAB — COMPREHENSIVE METABOLIC PANEL
ALT: 22 U/L (ref 17–63)
AST: 35 U/L (ref 15–41)
Albumin: 2.4 g/dL — ABNORMAL LOW (ref 3.5–5.0)
Alkaline Phosphatase: 54 U/L (ref 38–126)
Anion gap: 8 (ref 5–15)
BUN: 32 mg/dL — ABNORMAL HIGH (ref 6–20)
CO2: 25 mmol/L (ref 22–32)
Calcium: 7.7 mg/dL — ABNORMAL LOW (ref 8.9–10.3)
Chloride: 104 mmol/L (ref 101–111)
Creatinine, Ser: 1.92 mg/dL — ABNORMAL HIGH (ref 0.61–1.24)
GFR calc Af Amer: 37 mL/min — ABNORMAL LOW (ref >60)
GFR calc non Af Amer: 32 mL/min — ABNORMAL LOW (ref >60)
Glucose, Bld: 173 mg/dL — ABNORMAL HIGH (ref 65–99)
Potassium: 3.6 mmol/L (ref 3.5–5.1)
Sodium: 137 mmol/L (ref 135–145)
Total Bilirubin: 0.5 mg/dL (ref 0.3–1.2)
Total Protein: 5 g/dL — ABNORMAL LOW (ref 6.5–8.1)

## 2015-10-30 LAB — POCT I-STAT 4, (NA,K, GLUC, HGB,HCT)
Glucose, Bld: 135 mg/dL — ABNORMAL HIGH (ref 65–99)
HCT: 32 % — ABNORMAL LOW (ref 39.0–52.0)
Hemoglobin: 10.9 g/dL — ABNORMAL LOW (ref 13.0–17.0)
Potassium: 3.9 mmol/L (ref 3.5–5.1)
Sodium: 140 mmol/L (ref 135–145)

## 2015-10-30 LAB — PREPARE RBC (CROSSMATCH)

## 2015-10-30 LAB — COOXEMETRY PANEL
Carboxyhemoglobin: 1 % (ref 0.5–1.5)
Methemoglobin: 1 % (ref 0.0–1.5)
O2 Saturation: 70.2 %
Total hemoglobin: 8.3 g/dL — ABNORMAL LOW (ref 12.0–16.0)

## 2015-10-30 LAB — TRIGLYCERIDES: TRIGLYCERIDES: 92 mg/dL (ref <150)

## 2015-10-30 MED ORDER — PANTOPRAZOLE SODIUM 40 MG PO PACK
40.0000 mg | PACK | ORAL | Status: DC
  Administered 2015-10-30 – 2015-11-07 (×9): 40 mg
  Filled 2015-10-30 (×8): qty 20

## 2015-10-30 MED ORDER — SODIUM CHLORIDE 0.9 % IV SOLN
Freq: Once | INTRAVENOUS | Status: AC
  Administered 2015-10-30: 10 mL/h via INTRAVENOUS

## 2015-10-30 MED ORDER — PROPOFOL 1000 MG/100ML IV EMUL
0.0000 ug/kg/min | INTRAVENOUS | Status: DC
  Administered 2015-10-30: 20 ug/kg/min via INTRAVENOUS
  Administered 2015-10-30: 5 ug/kg/min via INTRAVENOUS
  Administered 2015-10-31 (×2): 20 ug/kg/min via INTRAVENOUS
  Administered 2015-10-31 (×2): 25 ug/kg/min via INTRAVENOUS
  Administered 2015-11-01 (×2): 20 ug/kg/min via INTRAVENOUS
  Administered 2015-11-02: 15 ug/kg/min via INTRAVENOUS
  Administered 2015-11-02 (×2): 20 ug/kg/min via INTRAVENOUS
  Administered 2015-11-02 – 2015-11-03 (×2): 15 ug/kg/min via INTRAVENOUS
  Administered 2015-11-03: 40 ug/kg/min via INTRAVENOUS
  Administered 2015-11-03: 30 ug/kg/min via INTRAVENOUS
  Administered 2015-11-03: 20 ug/kg/min via INTRAVENOUS
  Administered 2015-11-04 (×5): 30 ug/kg/min via INTRAVENOUS
  Administered 2015-11-04: 40 ug/kg/min via INTRAVENOUS
  Administered 2015-11-05: 30 ug/kg/min via INTRAVENOUS
  Filled 2015-10-30 (×26): qty 100

## 2015-10-30 MED ORDER — POTASSIUM CHLORIDE 10 MEQ/50ML IV SOLN
10.0000 meq | INTRAVENOUS | Status: AC | PRN
Start: 2015-10-30 — End: 2015-11-05
  Administered 2015-10-30 – 2015-11-05 (×3): 10 meq via INTRAVENOUS
  Filled 2015-10-30 (×3): qty 50

## 2015-10-30 MED ORDER — FUROSEMIDE 10 MG/ML IJ SOLN
40.0000 mg | Freq: Three times a day (TID) | INTRAMUSCULAR | Status: DC
  Administered 2015-10-30 – 2015-11-02 (×9): 40 mg via INTRAVENOUS
  Filled 2015-10-30 (×8): qty 4

## 2015-10-30 NOTE — Progress Notes (Addendum)
[address]       [place] [address]             [phone]        CARDIOTHORACIC SURGERY PROGRESS NOTE   R4 Days Post-Op Procedure(s) (LRB): CORONARY ARTERY BYPASS GRAFTING (CABG) x3(LIMA to LA, SVG to OM1, SVG to PDA) with EVH from the left thigh and partial lower leg greater saphenous vein and left internal mammary artery (N/A) TRANSESOPHAGEAL ECHOCARDIOGRAM (TEE) (N/A) INTRA-AORTIC BALLOON PUMP INSERTION size 40cc (Left)  Subjective: Sedated on vent  Objective: Vital signs: BP Readings from Last 1 Encounters:  10/30/15 (!) 118/55   Pulse Readings from Last 1 Encounters:  10/30/15 77   Resp Readings from Last 1 Encounters:  10/30/15 15   Temp Readings from Last 1 Encounters:  10/30/15 99.7 F (37.6 C)    Hemodynamics: PAP: (31-55)/(20-29) 42/25 CVP:  [9 mmHg-27 mmHg] 14 mmHg CO:  [6.8 L/min-8.3 L/min] 7.8 L/min CI:  [3.2 L/min/m2-3.9 L/min/m2] 3.7 L/min/m2  Mixed venous co-ox 70.2%  Physical Exam:  Rhythm:   Afib w/ controlled rate  Breath sounds: clear  Heart sounds:  irregular  Incisions:  Dressing dry, intact  Abdomen:  Soft, non-distended, tolerating tube feeds  Extremities:  Warm, well-perfused  Chest tubes:  Low volume thin serosanguinous output, no air leak    Intake/Output from previous day: 10/20 0701 - 10/21 0700 In: 4645.8 [I.V.:2830.8; NG/GT:1415; IV Piggyback:400] Out: [id number] [Urine:2795; Emesis/NG output:1000; Chest Tube:140] Intake/Output this shift: Total I/O In: 271.5 [I.V.:81.5; NG/GT:190] Out: 160 [Urine:160]  Lab Results:  CBC: Recent Labs  10/29/15 0500 10/29/15 1531 10/30/15 0344  WBC 21.1*  --  14.8*  HGB 8.4* 8.2* 7.5*  HCT 25.3* 24.0* 22.2*  PLT 183  --  136*    BMET:  Recent Labs  10/29/15 0500 10/29/15 1531 10/30/15 0344  NA 138 139 137  K 4.1 4.0 3.6  CL 106 102 104  CO2 27  --  25  GLUCOSE 114* 163* 173*  BUN 28* 28* 32*  CREATININE 2.02* 1.90* 1.92*  CALCIUM 7.6*  --  7.7*     PT/INR:   Recent Labs  10/29/15 0500  LABPROT 18.5*  INR 1.52    CBG (last 3)   Recent Labs  10/29/15 2340 10/30/15 0331 10/30/15 0729  GLUCAP 184* 168* 180*    ABG    Component Value Date/Time   PHART 7.385 10/30/2015 0335   PCO2ART 43.2 10/30/2015 0335   PO2ART 77.0 (L) 10/30/2015 0335   HCO3 25.7 10/30/2015 0335   TCO2 27 10/30/2015 0335   ACIDBASEDEF 1.0 10/29/2015 1531   O2SAT 95.0 10/30/2015 0335    CXR: PORTABLE CHEST 1 VIEW  COMPARISON:  10/29/2015  FINDINGS: Endotracheal tube tip 5 cm above the carina. Bilateral chest tubes and mediastinal drain in place. No pneumothorax.  Swan-Ganz catheter projects at the pulmonary outflow tract level, unchanged.  Post CABG.  Heart size top-normal.  Calcified mildly tortuous aorta.  Pulmonary vascular congestion.  Left sided and possibly right-sided pleural effusion.  Consolidation lung bases may represent pleural fluid with atelectasis rather than infiltrate.  Degenerative changes right acromioclavicular joint.  IMPRESSION: Overall no significant change.  Post CABG.  Chest tubes in place without pneumothorax detected.  Pulmonary vascular congestion.  Left-sided and possibly right-sided pleural effusion.  Consolidation lung bases may be related to atelectasis and pleural fluid rather than infiltrate.   Electronically Signed   By: Genia Del M.D.   On:  10/30/2015 07:22  Assessment/Plan: S/P Procedure(s) (LRB): CORONARY ARTERY BYPASS GRAFTING (CABG) x3(LIMA to LA, SVG to OM1, SVG to PDA) with EVH from the left thigh and partial lower leg greater saphenous vein and left internal mammary artery (N/A) TRANSESOPHAGEAL ECHOCARDIOGRAM (TEE) (N/A) INTRA-AORTIC BALLOON PUMP INSERTION size 40cc (Left)  Overall reasonably stable now POD4 Afib w/ controlled rate, stable hemodynamics w/ high cardiac output and relatively low filling pressures on levophed and low dose dopamine +  milrinone Oxygenation improving w/ O2 sats 97% on 55% FiO2 and PEEP=10 Acute on chronic combined systolic and diastolic CHF with expected post-op volume excess, diuresing w/ lasix CKD with acute kidney injury likely related to acute MI and surgery, creatinine stable 1.9 and good UOP Expected post op acute blood loss anemia, Hgb down to 7.5 this morning Leukocytosis, likely reactive - improving - on empiric ABx Likely small-moderate bilateral pleural effusions on CXR - chest tubes in place   Wean FiO2 and PEEP per Pulm/CCM team - probably not ready for acute vent wean yet  Transfuse 2 units PRBC's  Continue diuresis  Wean milrinone slowly  Wean levophed as tolerated  D/C mediastinal tube  Leave pleural tubes in place  Continue amiodarone  Continue tube feeds  Continue low dose lovenox   Rexene Alberts, MD 10/30/2015 10:41 AM

## 2015-10-30 NOTE — Progress Notes (Signed)
T[facility] BRIEF SICU PROGRESS NOTE  4 Days Post-Op  S/P Procedure(s) (LRB): CORONARY ARTERY BYPASS GRAFTING (CABG) x3(LIMA to LA, SVG to OM1, SVG to PDA) with EVH from the left thigh and partial lower leg greater saphenous vein and left internal mammary artery (N/A) TRANSESOPHAGEAL ECHOCARDIOGRAM (TEE) (N/A) INTRA-AORTIC BALLOON PUMP INSERTION size 40cc (Left)   Stable day Sedated on vent Afib w/ stable rate and hemodynamics O2 sats 98% but back up to 60% FiO2 and PEEP=10 Excellent UOP  Plan: Continue current plan  Rexene Alberts, MD 10/30/2015 6:40 PM

## 2015-10-30 NOTE — Progress Notes (Signed)
PULMONARY / CRITICAL CARE MEDICINE   Name: Mark Rivers MRN: [id number] DOB: 1934-08-03    ADMISSION DATE:  10/25/2015 CONSULTATION DATE:  10/27/2015  REFERRING MD:  CVTS  CHIEF COMPLAINT:  HYPOXIA  HISTORY OF PRESENT ILLNESS:   81 yo male presented with increasing dyspnea and a fib with RVR.  Found to have 99% Lt main CAD and had emergent CABG 10/17.  Developed refractory hypoxia post-op and PCCM consulted.  PMHx of CAD, HTN, DM, GERD, CKD (baseline creatinine 1.2), COPD with emphysema (DLCO 64% from April 2015)  SUBJECTIVE:  Still on high PEEP/FiO2.  Needing sedation to maintain vent synchrony.  VITAL SIGNS: BP (!) 103/49   Pulse 73   Temp 99.7 F (37.6 C)   Resp 11   Ht 5\' 6"  (1.676 m)   Wt 238 lb 8.6 oz (108.2 kg)   SpO2 96%   BMI 38.50 kg/m   HEMODYNAMICS: PAP: (31-55)/(20-29) 42/25 CVP:  [9 mmHg-27 mmHg] 14 mmHg CO:  [6.8 L/min-8.3 L/min] 7.8 L/min CI:  [3.2 L/min/m2-3.9 L/min/m2] 3.7 L/min/m2  VENTILATOR SETTINGS: Vent Mode: PRVC FiO2 (%):  [60 %-100 %] 60 % Set Rate:  [24 bmp] 24 bmp Vt Set:  [510 mL] 510 mL PEEP:  [10 cmH20] 10 cmH20 Plateau Pressure:  [16 cmH20-23 cmH20] 17 cmH20  INTAKE / OUTPUT: I/O last 3 completed shifts: In: 7036.5 [I.V.:5041.5; NG/GT:1595; IV [id number] Out: [id number]; Emesis/NG output:1250; Chest Tube:350]  PHYSICAL EXAMINATION: General: ill appearing Neuro: RASS -3 HEENT: ETT in place Cardiac: irregular Chest: no wheeze, faint b/l crackles Abd: soft, decreased BS Ext: 1+ edema Skin: no rashes  LABS:  BMET  Recent Labs Lab 10/28/15 0300  10/29/15 0500 10/29/15 1531 10/30/15 0344  NA 137  < > 138 139 137  K 3.8  < > 4.1 4.0 3.6  CL 106  < > 106 102 104  CO2 26  --  27  --  25  BUN 27*  < > 28* 28* 32*  CREATININE 1.96*  < > 2.02* 1.90* 1.92*  GLUCOSE 120*  < > 114* 163* 173*  < > = values in this interval not displayed.  Electrolytes  Recent Labs Lab 10/27/15 0448 10/27/15 1650  10/28/15 0300 10/29/15 0500 10/30/15 0344  CALCIUM 8.4*  --  7.9* 7.6* 7.7*  MG 2.4 2.0  --   --   --     CBC  Recent Labs Lab 10/28/15 0300  10/29/15 0500 10/29/15 1531 10/30/15 0344  WBC 23.2*  --  21.1*  --  14.8*  HGB 9.1*  < > 8.4* 8.2* 7.5*  HCT 27.0*  < > 25.3* 24.0* 22.2*  PLT 191  --  183  --  136*  < > = values in this interval not displayed.  Coag's  Recent Labs Lab 10/26/15 0137 10/27/15 0026 10/29/15 0500  APTT  --  36 45*  INR 1.12 1.44 1.52    Sepsis Markers  Recent Labs Lab 10/27/15 0830 10/28/15 0300 10/29/15 0500  LATICACIDVEN 4.5* 1.1  --   PROCALCITON 5.15 5.36 3.32    ABG  Recent Labs Lab 10/29/15 1531 10/29/15 2051 10/30/15 0335  PHART 7.389 7.410 7.385  PCO2ART 39.9 38.3 43.2  PO2ART 77.0* 141.0* 77.0*    Liver Enzymes  Recent Labs Lab 10/28/15 0300 10/29/15 0500 10/30/15 0344  AST 75* 54* 35  ALT 29 26 22   ALKPHOS 34* 46 54  BILITOT 0.3 0.4 0.5  ALBUMIN 3.3* 2.7* 2.4*    Cardiac Enzymes  Recent Labs Lab 10/26/15 0137 10/26/15 0605 10/26/15 1311  TROPONINI 0.79* 1.82* 2.11*    Glucose  Recent Labs Lab 10/29/15 1455 10/29/15 1525 10/29/15 2044 10/29/15 2340 10/30/15 0331 10/30/15 0729  GLUCAP 160* 163* 168* 184* 168* 180*    Imaging Dg Chest Port 1 View  Result Date: 10/30/2015 CLINICAL DATA:  81 year old male post CABG.  Subsequent encounter. EXAM: PORTABLE CHEST 1 VIEW COMPARISON:  10/29/2015 FINDINGS: Endotracheal tube tip 5 cm above the carina. Bilateral chest tubes and mediastinal drain in place. No pneumothorax. Swan-Ganz catheter projects at the pulmonary outflow tract level, unchanged. Post CABG.  Heart size top-normal. Calcified mildly tortuous aorta. Pulmonary vascular congestion. Left sided and possibly right-sided pleural effusion. Consolidation lung bases may represent pleural fluid with atelectasis rather than infiltrate. Degenerative changes right acromioclavicular joint. IMPRESSION:  Overall no significant change. Post CABG. Chest tubes in place without pneumothorax detected. Pulmonary vascular congestion. Left-sided and possibly right-sided pleural effusion. Consolidation lung bases may be related to atelectasis and pleural fluid rather than infiltrate. Electronically Signed   By: Genia Del M.D.   On: 10/30/2015 07:22   Dg Abd Portable 1v  Result Date: 10/29/2015 CLINICAL DATA:  Feeding tube placement. EXAM: PORTABLE ABDOMEN - 1 VIEW COMPARISON:  Chest x-ray 10/29/2015. FINDINGS: Feeding tube noted with tip projected over the third portion the duodenum. No bowel distention. No acute abnormality. IMPRESSION: Feeding tube noted with its tip projected over the third portion of the duodenum. No bowel distention. Electronically Signed   By: Marcello Moores  Register   On: 10/29/2015 12:23     STUDIES:   CULTURES: 10/18 sputum>> 10/18 bcx 2>>  ANTIBIOTICS: 10/17 levaquin >> 10/17 Vancomycin >> 10/18  SIGNIFICANT EVENTS: 10/17 Admit, cardiac cath, emergent CABG 10/18 0600 PCCM consulted for refractory hypoxia  LINES/TUBES: 10/17 ETT>> 10/17 CT's x 3>> 10/17 Lt rad a line>> 10/17 Lt fem IABP>> 10/17 Rt IJ PA cath>>  DISCUSSION: 81 yo with hx of cad and presented 10/17 with NSTEMI, went to cath lab and has 99% left main and required urgent surgery. Hypoxia during the early morning of 10/18 and PCCM called for vent management early am 10/18.  ASSESSMENT / PLAN:  PULMONARY A: Acute hypoxic respiratory failure. Hx of COPD. P:   Full vent support >> changed to pressure control 10/21 F/u CXR, ABG Pulmicort, duoneb  CARDIOVASCULAR A:  CAD post RCA stent 1988. NSTEMI 10/17 with CC 99% LM. 10/17 CABG x 3 per CVTS. P:  Pressors, inotropes, IABP, post op care per TCTS  RENAL A: AKI. Chronic kidney disease 2 >> baseline creatine 1.20. P:   Monitor renal fx, urine outpt  GASTROINTESTINAL A:   Nutrition. Hx of GERD P:   Tube feeds while on vent Protonix for  SUP  HEMATOLOGIC A:   Anemia after surgery and critical illness. P:  F/u CBC Transfuse for Hb < 7 or bleeding  INFECTIOUS A:   Concern for HCAP. P:   Continue levaquin  ENDOCRINE A: DM type II. P:   Insulin gtt  NEUROLOGIC A:   Acute metabolic encephalopathy. P:   RASS goal: -2 to -3  CC time 31 minutes.  Chesley Mires, MD [facility] Pulmonary/Critical Care 10/30/2015, 7:47 AM Pager:  [phone] After 3pm call: [phone]

## 2015-10-31 ENCOUNTER — Inpatient Hospital Stay (HOSPITAL_COMMUNITY): Payer: Medicare Other

## 2015-10-31 LAB — COMPREHENSIVE METABOLIC PANEL
ALT: 22 U/L (ref 17–63)
AST: 37 U/L (ref 15–41)
Albumin: 2.3 g/dL — ABNORMAL LOW (ref 3.5–5.0)
Alkaline Phosphatase: 69 U/L (ref 38–126)
Anion gap: 7 (ref 5–15)
BUN: 41 mg/dL — ABNORMAL HIGH (ref 6–20)
CO2: 27 mmol/L (ref 22–32)
Calcium: 7.4 mg/dL — ABNORMAL LOW (ref 8.9–10.3)
Chloride: 103 mmol/L (ref 101–111)
Creatinine, Ser: 2.12 mg/dL — ABNORMAL HIGH (ref 0.61–1.24)
GFR calc Af Amer: 32 mL/min — ABNORMAL LOW (ref >60)
GFR calc non Af Amer: 28 mL/min — ABNORMAL LOW (ref >60)
Glucose, Bld: 146 mg/dL — ABNORMAL HIGH (ref 65–99)
Potassium: 3.9 mmol/L (ref 3.5–5.1)
Sodium: 137 mmol/L (ref 135–145)
Total Bilirubin: 0.7 mg/dL (ref 0.3–1.2)
Total Protein: 5.2 g/dL — ABNORMAL LOW (ref 6.5–8.1)

## 2015-10-31 LAB — GLUCOSE, CAPILLARY
Glucose-Capillary: 117 mg/dL — ABNORMAL HIGH (ref 65–99)
Glucose-Capillary: 122 mg/dL — ABNORMAL HIGH (ref 65–99)
Glucose-Capillary: 128 mg/dL — ABNORMAL HIGH (ref 65–99)
Glucose-Capillary: 141 mg/dL — ABNORMAL HIGH (ref 65–99)
Glucose-Capillary: 142 mg/dL — ABNORMAL HIGH (ref 65–99)
Glucose-Capillary: 147 mg/dL — ABNORMAL HIGH (ref 65–99)

## 2015-10-31 LAB — TYPE AND SCREEN
ABO/RH(D): O POS
Antibody Screen: NEGATIVE
Unit division: 0
Unit division: 0

## 2015-10-31 LAB — POCT I-STAT 3, ART BLOOD GAS (G3+)
Acid-Base Excess: 3 mmol/L — ABNORMAL HIGH (ref 0.0–2.0)
Acid-Base Excess: 5 mmol/L — ABNORMAL HIGH (ref 0.0–2.0)
Bicarbonate: 29 mmol/L — ABNORMAL HIGH (ref 20.0–28.0)
Bicarbonate: 30.2 mmol/L — ABNORMAL HIGH (ref 20.0–28.0)
O2 Saturation: 94 %
O2 Saturation: 88 %
Patient temperature: 37.8
Patient temperature: 99.1
TCO2: 31 mmol/L (ref 0–100)
TCO2: 32 mmol/L (ref 0–100)
pCO2 arterial: 55.3 mmHg — ABNORMAL HIGH (ref 32.0–48.0)
pCO2 arterial: 47.5 mmHg (ref 32.0–48.0)
pH, Arterial: 7.331 — ABNORMAL LOW (ref 7.350–7.450)
pH, Arterial: 7.412 (ref 7.350–7.450)
pO2, Arterial: 80 mmHg — ABNORMAL LOW (ref 83.0–108.0)
pO2, Arterial: 56 mmHg — ABNORMAL LOW (ref 83.0–108.0)

## 2015-10-31 LAB — CBC
HCT: 26.6 % — ABNORMAL LOW (ref 39.0–52.0)
Hemoglobin: 8.9 g/dL — ABNORMAL LOW (ref 13.0–17.0)
MCH: 30.1 pg (ref 26.0–34.0)
MCHC: 33.5 g/dL (ref 30.0–36.0)
MCV: 89.9 fL (ref 78.0–100.0)
Platelets: 143 10*3/uL — ABNORMAL LOW (ref 150–400)
RBC: 2.96 MIL/uL — ABNORMAL LOW (ref 4.22–5.81)
RDW: 16.4 % — ABNORMAL HIGH (ref 11.5–15.5)
WBC: 14.5 10*3/uL — ABNORMAL HIGH (ref 4.0–10.5)

## 2015-10-31 LAB — COOXEMETRY PANEL
Carboxyhemoglobin: 1.1 % (ref 0.5–1.5)
Methemoglobin: 0.9 % (ref 0.0–1.5)
O2 Saturation: 75.4 %
Total hemoglobin: 9.1 g/dL — ABNORMAL LOW (ref 12.0–16.0)

## 2015-10-31 MED ORDER — SODIUM CHLORIDE 0.9% FLUSH
10.0000 mL | Freq: Two times a day (BID) | INTRAVENOUS | Status: DC
  Administered 2015-10-31 – 2015-11-19 (×11): 10 mL
  Administered 2015-11-20: 20 mL
  Administered 2015-11-20 – 2015-11-21 (×2): 10 mL

## 2015-10-31 MED ORDER — SODIUM CHLORIDE 0.9% FLUSH
10.0000 mL | INTRAVENOUS | Status: DC | PRN
  Administered 2015-11-06: 10 mL
  Filled 2015-10-31: qty 40

## 2015-10-31 NOTE — Progress Notes (Signed)
Wasted approximately 175-200 mL of fentanyl when switching from CVC sleeve to newly placed PICC line. Lily Kocher RN witnessed waste in sink.  Sydnee Cabal. Lovena Le RN

## 2015-10-31 NOTE — Anesthesia Postprocedure Evaluation (Signed)
Anesthesia Post Note  Patient: Mark Rivers  Procedure(s) Performed: Procedure(s) (LRB): CORONARY ARTERY BYPASS GRAFTING (CABG) x3(LIMA to LA, SVG to OM1, SVG to PDA) with EVH from the left thigh and partial lower leg greater saphenous vein and left internal mammary artery (N/A) TRANSESOPHAGEAL ECHOCARDIOGRAM (TEE) (N/A) INTRA-AORTIC BALLOON PUMP INSERTION size 40cc (Left)  Patient location during evaluation: ICU Anesthesia Type: General Level of consciousness: sedated Pain management: pain level controlled Vital Signs Assessment: post-procedure vital signs reviewed and stable Respiratory status: patient remains intubated per anesthesia plan Cardiovascular status: stable Postop Assessment: no signs of nausea or vomiting Anesthetic complications: no    Last Vitals:  Vitals:   10/31/15 1800 10/31/15 1815  BP: (!) 103/42 (!) 93/44  Pulse: 70 77  Resp: 16 12  Temp:      Last Pain:  Vitals:   10/30/15 1502  TempSrc: Core (Comment)  PainSc:                  Mark Rivers

## 2015-10-31 NOTE — Progress Notes (Signed)
PCCM PROGRESS NOTE   Admission date:  10/25/2015 Consult date:  10/27/2015 Referring provider:  Dr. Prescott Gum  CC:  Short of breath  Subjective:  Oxygenation improved.  Doing better with pressure control.  Vital signs: BP 94/63   Pulse 97   Temp 100 F (37.8 C)   Resp 12   Ht 5\' 6"  (1.676 m)   Wt 242 lb 11.6 oz (110.1 kg)   SpO2 97%   BMI 39.18 kg/m    Intake/output: I/O last 3 completed shifts: In: 6836.8 [I.V.:3311.8; Blood:670; [id number]; IV [id number] Out: [id number]; Emesis/NG output:1600; Chest Tube:280]  Hemodynamics: PAP: (33-57)/(21-36) 50/28 CVP:  [11 mmHg-24 mmHg] 20 mmHg CO:  [7.6 L/min-8 L/min] 8 L/min CI:  [3.6 L/min/m2-3.8 L/min/m2] 3.8 L/min/m2  Vent settings: Vent Mode: PCV FiO2 (%):  [60 %] 60 % Set Rate:  [14 bmp] 14 bmp Vt Set:  [510 mL] 510 mL PEEP:  [10 cmH20] 10 cmH20 Plateau Pressure:  [16 cmH20-18 cmH20] 18 cmH20   General: ill appearing Neuro: RASS -3 HEENT: ETT in place Cardiac: irregular Chest: no wheeze, faint b/l crackles Abd: soft, decreased BS Ext: 1+ edema Skin: no rashes  LABS: CMP Latest Ref Rng & Units 10/31/2015 10/30/2015 10/30/2015  Glucose 65 - 99 mg/dL 146(H) 135(H) 173(H)  BUN 6 - 20 mg/dL 41(H) - 32(H)  Creatinine 0.61 - 1.24 mg/dL 2.12(H) - 1.92(H)  Sodium 135 - 145 mmol/L 137 140 137  Potassium 3.5 - 5.1 mmol/L 3.9 3.9 3.6  Chloride 101 - 111 mmol/L 103 - 104  CO2 22 - 32 mmol/L 27 - 25  Calcium 8.9 - 10.3 mg/dL 7.4(L) - 7.7(L)  Total Protein 6.5 - 8.1 g/dL 5.2(L) - 5.0(L)  Total Bilirubin 0.3 - 1.2 mg/dL 0.7 - 0.5  Alkaline Phos 38 - 126 U/L 69 - 54  AST 15 - 41 U/L 37 - 35  ALT 17 - 63 U/L 22 - 22    CBC Latest Ref Rng & Units 10/31/2015 10/30/2015 10/30/2015  WBC 4.0 - 10.5 K/uL 14.5(H) - 16.1(H)  Hemoglobin 13.0 - 17.0 g/dL 8.9(L) 10.9(L) 8.5(L)  Hematocrit 39.0 - 52.0 % 26.6(L) 32.0(L) 25.4(L)  Platelets 150 - 400 K/uL 143(L) - 138(L)    ABG    Component Value Date/Time   PHART  7.331 (L) 10/31/2015 0349   PCO2ART 55.3 (H) 10/31/2015 0349   PO2ART 80.0 (L) 10/31/2015 0349   HCO3 29.0 (H) 10/31/2015 0349   TCO2 31 10/31/2015 0349   ACIDBASEDEF 1.0 10/29/2015 1531   O2SAT 75.4 10/31/2015 0354    CBG (last 3)   Recent Labs  10/30/15 2332 10/31/15 0320 10/31/15 0731  GLUCAP 133* 147* 117*    Imaging: Dg Chest Port 1 View  Result Date: 10/30/2015 CLINICAL DATA:  81 year old male post CABG.  Subsequent encounter. EXAM: PORTABLE CHEST 1 VIEW COMPARISON:  10/29/2015 FINDINGS: Endotracheal tube tip 5 cm above the carina. Bilateral chest tubes and mediastinal drain in place. No pneumothorax. Swan-Ganz catheter projects at the pulmonary outflow tract level, unchanged. Post CABG.  Heart size top-normal. Calcified mildly tortuous aorta. Pulmonary vascular congestion. Left sided and possibly right-sided pleural effusion. Consolidation lung bases may represent pleural fluid with atelectasis rather than infiltrate. Degenerative changes right acromioclavicular joint. IMPRESSION: Overall no significant change. Post CABG. Chest tubes in place without pneumothorax detected. Pulmonary vascular congestion. Left-sided and possibly right-sided pleural effusion. Consolidation lung bases may be related to atelectasis and pleural fluid rather than infiltrate. Electronically Signed   By: Remo Lipps  Jeannine Kitten M.D.   On: 10/30/2015 07:22   Dg Abd Portable 1v  Result Date: 10/29/2015 CLINICAL DATA:  Feeding tube placement. EXAM: PORTABLE ABDOMEN - 1 VIEW COMPARISON:  Chest x-ray 10/29/2015. FINDINGS: Feeding tube noted with tip projected over the third portion the duodenum. No bowel distention. No acute abnormality. IMPRESSION: Feeding tube noted with its tip projected over the third portion of the duodenum. No bowel distention. Electronically Signed   By: Marcello Moores  Register   On: 10/29/2015 12:23    Studies:   Cultures: 10/18 sputum>> 10/18 bcx 2>>  Antibiotics: 10/17 levaquin >> 10/17  Vancomycin >> 10/18  Events: 10/17 Admit, cardiac cath, emergent CABG 10/18 0600 PCCM consulted for refractory hypoxia  Lines/tubes: 10/17 ETT>> 10/17 CT's x 3>> 10/17 Lt rad a line>> 10/17 Rt IJ PA cath>>  Summary: 81 yo male presented with increasing dyspnea and a fib with RVR.  Found to have 99% Lt main CAD and had emergent CABG 10/17.  Developed refractory hypoxia post-op and PCCM consulted.  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  Assessment/plan:  Acute hypoxic respiratory failure from HCAP and pulmonary edema. Hx of COPD. - changed to pressure control 10/21  - might be ready for vent weaning soon - adjust PEEP/FiO2 to keep SpO2 > 92% - f/u CXG, ABG - Pulmicort, duoneb - day 6 of levaquin  NSTEMI s/p CABG. Cardiogenic shock. - continue diuresis per TCTS - pressors, inotropes, post op care per TCTS - continue ASA, lipitor, lopressor  AKI. Chronic kidney disease 2 >> baseline creatine 1.20. - Monitor renal fx, urine outpt  Nutrition. Hx of GERD - Tube feeds while on vent - Protonix for SUP  Anemia after surgery and critical illness.  - F/u CBC - Transfuse for Hb < 7 or bleeding  DM type II. - SSI with levemir  Acute metabolic encephalopathy. - RASS goal: -2 to -3  SUP - Protonix DVT prophylaxis - Lovenox Goals of care - Full code  CC time 31 minutes.  Chesley Mires, MD [facility] Pulmonary/Critical Care 10/31/2015, 8:03 AM Pager:  [phone] After 3pm call: [phone]

## 2015-10-31 NOTE — Progress Notes (Signed)
Peripherally Inserted Central Catheter/Midline Placement  The IV Nurse has discussed with the patient and/or persons authorized to consent for the patient, the purpose of this procedure and the potential benefits and risks involved with this procedure.  The benefits include less needle sticks, lab draws from the catheter, and the patient may be discharged home with the catheter. Risks include, but not limited to, infection, bleeding, blood clot (thrombus formation), and puncture of an artery; nerve damage and irregular heartbeat and possibility to perform a PICC exchange if needed/ordered by physician.  Alternatives to this procedure were also discussed.  Bard Power PICC patient education guide, fact sheet on infection prevention and patient information card has been provided to patient /or left at bedside.  Shane RN obtained consent from family.  PICC/Midline Placement Documentation  PICC Triple Lumen 10/31/15 PICC Right Cephalic 39 cm 0 cm (Active)  Indication for Insertion or Continuance of Line Vasoactive infusions;Prolonged intravenous therapies;Poor Vasculature-patient has had multiple peripheral attempts or PIVs lasting less than 24 hours 10/31/2015 11:07 AM  Exposed Catheter (cm) 0 cm 10/31/2015 11:07 AM  Site Assessment Clean;Dry;Intact 10/31/2015 11:07 AM  Lumen #1 Status Flushed;Saline locked;Blood return noted 10/31/2015 11:07 AM  Lumen #2 Status Flushed;Saline locked;Blood return noted 10/31/2015 11:07 AM  Lumen #3 Status Flushed;Saline locked;Blood return noted 10/31/2015 11:07 AM  Dressing Type Transparent 10/31/2015 11:07 AM  Dressing Status Clean;Dry;Intact;Antimicrobial disc in place 10/31/2015 11:07 AM  Line Care Connections checked and tightened 10/31/2015 11:07 AM  Line Adjustment (NICU/IV Team Only) No 10/31/2015 11:07 AM  Dressing Intervention New dressing 10/31/2015 11:07 AM  Dressing Change Due 11/07/15 10/31/2015 11:07 AM       Rolena Infante 10/31/2015, 11:08  AM

## 2015-10-31 NOTE — Progress Notes (Signed)
[address]       ,[place] [address]             [phone]        CARDIOTHORACIC SURGERY PROGRESS NOTE   R5 Days Post-Op Procedure(s) (LRB): CORONARY ARTERY BYPASS GRAFTING (CABG) x3(LIMA to LA, SVG to OM1, SVG to PDA) with EVH from the left thigh and partial lower leg greater saphenous vein and left internal mammary artery (N/A) TRANSESOPHAGEAL ECHOCARDIOGRAM (TEE) (N/A) INTRA-AORTIC BALLOON PUMP INSERTION size 40cc (Left)  Subjective: Sedated on vent  Objective: Vital signs: BP Readings from Last 1 Encounters:  10/31/15 107/60   Pulse Readings from Last 1 Encounters:  10/31/15 (!) 116   Resp Readings from Last 1 Encounters:  10/31/15 16   Temp Readings from Last 1 Encounters:  10/31/15 (!) 100.4 F (38 C)    Hemodynamics: PAP: (36-57)/(21-32) 56/30 CVP:  [12 mmHg-31 mmHg] 31 mmHg CO:  [7.6 L/min-8 L/min] 8 L/min CI:  [3.6 L/min/m2-3.8 L/min/m2] 3.8 L/min/m2  Physical Exam:  Rhythm:   Afib w/ HR 100-110  Breath sounds: Minimal airway secretions  Heart sounds:  irregualr  Incisions:  Clean and dry  Abdomen:  Soft, non-distended, tolerating tube feeds  Extremities:  Warm, well-perfused  Chest tubes:  low volume thin serosanguinous output, no air leak    Intake/Output from previous day: 10/21 0701 - 10/22 0700 In: 4638.5 [I.V.:1978.5; Blood:670; NG/GT:1790; IV [id number] Out: [id number]; Emesis/NG output:1200; Chest Tube:180] Intake/Output this shift: Total I/O In: 663.3 [I.V.:248.3; NG/GT:315; IV Piggyback:100] Out: 920 [Urine:440; Emesis/NG output:440; Chest Tube:40]  Lab Results:  CBC: Recent Labs  10/30/15 1800 10/30/15 2211 10/31/15 0330  WBC 16.1*  --  14.5*  HGB 8.5* 10.9* 8.9*  HCT 25.4* 32.0* 26.6*  PLT 138*  --  143*    BMET:  Recent Labs  10/30/15 0344 10/30/15 2211 10/31/15 0330  NA 137 140 137  K 3.6 3.9 3.9  CL 104  --  103  CO2 25  --  27  GLUCOSE 173* 135* 146*  BUN 32*  --  41*    CREATININE 1.92*  --  2.12*  CALCIUM 7.7*  --  7.4*     PT/INR:   Recent Labs  10/29/15 0500  LABPROT 18.5*  INR 1.52    CBG (last 3)   Recent Labs  10/30/15 2332 10/31/15 0320 10/31/15 0731  GLUCAP 133* 147* 117*    ABG    Component Value Date/Time   PHART 7.331 (L) 10/31/2015 0349   PCO2ART 55.3 (H) 10/31/2015 0349   PO2ART 80.0 (L) 10/31/2015 0349   HCO3 29.0 (H) 10/31/2015 0349   TCO2 31 10/31/2015 0349   ACIDBASEDEF 1.0 10/29/2015 1531   O2SAT 75.4 10/31/2015 0354    CXR: PORTABLE CHEST 1 VIEW  COMPARISON:  10/30/2015  FINDINGS: Cardiac shadow is stable. Postsurgical changes are again seen. The endotracheal tube, nasogastric catheter and feeding catheter are noted in satisfactory position. Swan-Ganz catheter is noted in pulmonary outflow tract. Bilateral thoracostomy catheters are again seen. Bilateral pleural effusions are noted left greater than right. No bony abnormality is seen.  IMPRESSION: Bilateral pleural effusions left greater than right.  Tubes and lines as described.   Electronically Signed   By: Inez Catalina M.D.   On: 10/31/2015 09:06  Assessment/Plan: S/P Procedure(s) (LRB): CORONARY ARTERY BYPASS GRAFTING (CABG) x3(LIMA to LA, SVG to OM1, SVG to PDA) with EVH from the left thigh and partial lower leg greater saphenous vein  and left internal mammary artery (N/A) TRANSESOPHAGEAL ECHOCARDIOGRAM (TEE) (N/A) INTRA-AORTIC BALLOON PUMP INSERTION size 40cc (Left)  Overall stable now POD5 Afib w/ stable HR and hemodynamics w/ high cardiac output and relatively low filling pressures on low dose dopamine + milrinone, mixed venous co-ox 75% Oxygenation improving w/ O2 sats 90-92% on 50% FiO2 and PEEP=8 Acute on chronic combined systolic and diastolic CHF with expected post-op volume excess, diuresing w/ lasix CKD with acute kidney injury likely related to acute MI and surgery, creatinine up slightly 2.1 and good UOP Expected post op  acute blood loss anemia, Hgb up to 8.9 this morning after transfusion Leukocytosis, likely reactive - improving - on empiric ABx Likely small-moderate bilateral pleural effusions on CXR - chest tubes in place   Wean FiO2 and PEEP per Pulm/CCM team - probably still not ready for acute vent wean yet  Continue diuresis  Continue to wean milrinone slowly  Leave pleural tubes in place  Continue amiodarone  Continue tube feeds  Continue low dose lovenox  Continue empiric ABx for now  Rexene Alberts, MD 10/31/2015 10:56 AM

## 2015-10-31 NOTE — Progress Notes (Signed)
TCTS BRIEF SICU PROGRESS NOTE  5 Days Post-Op  S/P Procedure(s) (LRB): CORONARY ARTERY BYPASS GRAFTING (CABG) x3(LIMA to LA, SVG to OM1, SVG to PDA) with EVH from the left thigh and partial lower leg greater saphenous vein and left internal mammary artery (N/A) TRANSESOPHAGEAL ECHOCARDIOGRAM (TEE) (N/A) INTRA-AORTIC BALLOON PUMP INSERTION size 40cc (Left)   Stable day but no progress w/ vent weaning O2 sats 92-95% back up to 55% FiO2 with PEEP=10 Currently NSR w/ PAC's and short runs Afib BP stable Diuresing well  Plan: Continue current plan  Rexene Alberts, MD 10/31/2015 7:03 PM

## 2015-11-01 ENCOUNTER — Inpatient Hospital Stay (HOSPITAL_COMMUNITY): Payer: Medicare Other

## 2015-11-01 ENCOUNTER — Other Ambulatory Visit (HOSPITAL_COMMUNITY): Payer: Medicare Other

## 2015-11-01 DIAGNOSIS — Z419 Encounter for procedure for purposes other than remedying health state, unspecified: Secondary | ICD-10-CM

## 2015-11-01 LAB — GLUCOSE, CAPILLARY
Glucose-Capillary: 105 mg/dL — ABNORMAL HIGH (ref 65–99)
Glucose-Capillary: 109 mg/dL — ABNORMAL HIGH (ref 65–99)
Glucose-Capillary: 121 mg/dL — ABNORMAL HIGH (ref 65–99)
Glucose-Capillary: 134 mg/dL — ABNORMAL HIGH (ref 65–99)
Glucose-Capillary: 143 mg/dL — ABNORMAL HIGH (ref 65–99)
Glucose-Capillary: 176 mg/dL — ABNORMAL HIGH (ref 65–99)
Glucose-Capillary: 71 mg/dL (ref 65–99)

## 2015-11-01 LAB — COMPREHENSIVE METABOLIC PANEL
ALT: 22 U/L (ref 17–63)
AST: 36 U/L (ref 15–41)
Albumin: 2.2 g/dL — ABNORMAL LOW (ref 3.5–5.0)
Alkaline Phosphatase: 78 U/L (ref 38–126)
Anion gap: 10 (ref 5–15)
BUN: 56 mg/dL — ABNORMAL HIGH (ref 6–20)
CO2: 28 mmol/L (ref 22–32)
Calcium: 7.3 mg/dL — ABNORMAL LOW (ref 8.9–10.3)
Chloride: 102 mmol/L (ref 101–111)
Creatinine, Ser: 2.22 mg/dL — ABNORMAL HIGH (ref 0.61–1.24)
GFR calc Af Amer: 31 mL/min — ABNORMAL LOW (ref >60)
GFR calc non Af Amer: 26 mL/min — ABNORMAL LOW (ref >60)
Glucose, Bld: 132 mg/dL — ABNORMAL HIGH (ref 65–99)
Potassium: 3.7 mmol/L (ref 3.5–5.1)
Sodium: 140 mmol/L (ref 135–145)
Total Bilirubin: 0.4 mg/dL (ref 0.3–1.2)
Total Protein: 5.2 g/dL — ABNORMAL LOW (ref 6.5–8.1)

## 2015-11-01 LAB — CBC
HCT: 25.9 % — ABNORMAL LOW (ref 39.0–52.0)
HCT: 25.3 % — ABNORMAL LOW (ref 39.0–52.0)
Hemoglobin: 8.7 g/dL — ABNORMAL LOW (ref 13.0–17.0)
Hemoglobin: 8.4 g/dL — ABNORMAL LOW (ref 13.0–17.0)
MCH: 30.3 pg (ref 26.0–34.0)
MCH: 30.2 pg (ref 26.0–34.0)
MCHC: 33.6 g/dL (ref 30.0–36.0)
MCHC: 33.2 g/dL (ref 30.0–36.0)
MCV: 90.2 fL (ref 78.0–100.0)
MCV: 91 fL (ref 78.0–100.0)
Platelets: 176 10*3/uL (ref 150–400)
Platelets: 185 10*3/uL (ref 150–400)
RBC: 2.87 MIL/uL — ABNORMAL LOW (ref 4.22–5.81)
RBC: 2.78 MIL/uL — ABNORMAL LOW (ref 4.22–5.81)
RDW: 16.5 % — ABNORMAL HIGH (ref 11.5–15.5)
RDW: 16.4 % — ABNORMAL HIGH (ref 11.5–15.5)
WBC: 13.6 10*3/uL — ABNORMAL HIGH (ref 4.0–10.5)
WBC: 13.5 10*3/uL — ABNORMAL HIGH (ref 4.0–10.5)

## 2015-11-01 LAB — CULTURE, BLOOD (ROUTINE X 2)
Culture: NO GROWTH
Culture: NO GROWTH

## 2015-11-01 LAB — POCT I-STAT 3, ART BLOOD GAS (G3+)
Acid-Base Excess: 3 mmol/L — ABNORMAL HIGH (ref 0.0–2.0)
Bicarbonate: 28.7 mmol/L — ABNORMAL HIGH (ref 20.0–28.0)
O2 Saturation: 90 %
Patient temperature: 99.3
TCO2: 30 mmol/L (ref 0–100)
pCO2 arterial: 51.9 mmHg — ABNORMAL HIGH (ref 32.0–48.0)
pH, Arterial: 7.353 (ref 7.350–7.450)
pO2, Arterial: 64 mmHg — ABNORMAL LOW (ref 83.0–108.0)

## 2015-11-01 LAB — COOXEMETRY PANEL
Carboxyhemoglobin: 1.4 % (ref 0.5–1.5)
Methemoglobin: 0.7 % (ref 0.0–1.5)
O2 Saturation: 75.3 %
Total hemoglobin: 8.7 g/dL — ABNORMAL LOW (ref 12.0–16.0)

## 2015-11-01 LAB — POCT I-STAT, CHEM 8
BUN: 67 mg/dL — ABNORMAL HIGH (ref 6–20)
Calcium, Ion: 1.02 mmol/L — ABNORMAL LOW (ref 1.15–1.40)
Chloride: 100 mmol/L — ABNORMAL LOW (ref 101–111)
Creatinine, Ser: 2.3 mg/dL — ABNORMAL HIGH (ref 0.61–1.24)
Glucose, Bld: 137 mg/dL — ABNORMAL HIGH (ref 65–99)
HCT: 25 % — ABNORMAL LOW (ref 39.0–52.0)
Hemoglobin: 8.5 g/dL — ABNORMAL LOW (ref 13.0–17.0)
Potassium: 3.9 mmol/L (ref 3.5–5.1)
Sodium: 141 mmol/L (ref 135–145)
TCO2: 30 mmol/L (ref 0–100)

## 2015-11-01 MED ORDER — VITAL HIGH PROTEIN PO LIQD
1000.0000 mL | ORAL | Status: DC
  Administered 2015-11-01 – 2015-11-04 (×3): 1000 mL

## 2015-11-01 MED ORDER — PHENYLEPHRINE HCL 10 MG/ML IJ SOLN
5.0000 ug/min | INTRAVENOUS | Status: DC
  Filled 2015-11-01: qty 2

## 2015-11-01 MED ORDER — POTASSIUM CHLORIDE 10 MEQ/50ML IV SOLN
10.0000 meq | INTRAVENOUS | Status: AC
  Administered 2015-11-01 (×3): 10 meq via INTRAVENOUS
  Filled 2015-11-01 (×2): qty 50

## 2015-11-01 MED ORDER — PRO-STAT SUGAR FREE PO LIQD
60.0000 mL | Freq: Three times a day (TID) | ORAL | Status: DC
  Administered 2015-11-01 – 2015-11-06 (×15): 60 mL
  Filled 2015-11-01 (×16): qty 60

## 2015-11-01 MED ORDER — ACETAMINOPHEN 325 MG PO TABS
650.0000 mg | ORAL_TABLET | Freq: Four times a day (QID) | ORAL | Status: DC | PRN
  Administered 2015-11-01 – 2015-11-24 (×4): 650 mg via ORAL
  Filled 2015-11-01 (×5): qty 2

## 2015-11-01 MED ORDER — LEVOFLOXACIN IN D5W 500 MG/100ML IV SOLN
500.0000 mg | INTRAVENOUS | Status: DC
Start: 2015-11-01 — End: 2015-11-02
  Filled 2015-11-01 (×2): qty 100

## 2015-11-01 NOTE — Procedures (Signed)
Intubation Procedure Note Lehmon Worsham [id number] 19-Sep-1934  Procedure: Intubation Indications: Respiratory insufficiency  Procedure Details Consent: Unable to obtain consent because of emergent medical necessity. Time Out: Verified patient identification, verified procedure, site/side was marked, verified correct patient position, special equipment/implants available, medications/allergies/relevent history reviewed, required imaging and test results available.  Performed  Maximum sterile technique was used including gloves, hand hygiene and mask.  MAC    Evaluation Hemodynamic Status: BP stable throughout; O2 sats: stable throughout Patient's Current Condition: stable Complications: No apparent complications Patient did tolerate procedure well. Chest X-ray ordered to verify placement.  CXR: pending.   [name] 11/01/2015

## 2015-11-01 NOTE — Progress Notes (Signed)
      [address]       [place] [address]             [phone]      Evening rounds  Intubated, sedated  BP (!) 117/53   Pulse 70   Temp (!) 100.5 F (38.1 C) (Oral)   Resp 16   Ht 5\' 6"  (1.676 m)   Wt 240 lb 4.8 oz (109 kg)   SpO2 97%   BMI 38.79 kg/m    Intake/Output Summary (Last 24 hours) at 11/01/15 1909 Last data filed at 11/01/15 1800  Gross per 24 hour  Intake           3184.8 ml  Output             3516 ml  Net           -331.2 ml   No new issues today  Remo Lipps C. Roxan Hockey, MD [facility] [phone]

## 2015-11-01 NOTE — Progress Notes (Signed)
6 Days Post-Op Procedure(s) (LRB): CORONARY ARTERY BYPASS GRAFTING (CABG) x3(LIMA to LA, SVG to OM1, SVG to PDA) with EVH from the left thigh and partial lower leg greater saphenous vein and left internal mammary artery (N/A) TRANSESOPHAGEAL ECHOCARDIOGRAM (TEE) (N/A) INTRA-AORTIC BALLOON PUMP INSERTION size 40cc (Left) Subjective: emerg CABG a week ago Cardiac fx appears ok but remains vent dependent Will need trac if not extubated soon Objective: Vital signs in last 24 hours: Temp:  [99.1 F (37.3 C)-100.4 F (38 C)] 99.3 F (37.4 C) (10/23 0319) Pulse Rate:  [65-117] 77 (10/23 0645) Cardiac Rhythm: Normal sinus rhythm (10/23 0400) Resp:  [0-25] 12 (10/23 0645) BP: (74-125)/(41-78) 109/49 (10/23 0645) SpO2:  [89 %-97 %] 93 % (10/23 0645) Arterial Line BP: (56-142)/(36-137) 128/49 (10/23 0700) FiO2 (%):  [50 %-79 %] 65 % (10/23 0400) Weight:  [240 lb 4.8 oz (109 kg)] 240 lb 4.8 oz (109 kg) (10/23 0500)  Hemodynamic parameters for last 24 hours: PAP: (38-56)/(21-34) 40/23 CVP:  [12 mmHg-31 mmHg] 12 mmHg  Intake/Output from previous day: 10/22 0701 - 10/23 0700 In: 2875.7 [I.V.:1265.7; NG/GT:1410; IV Piggyback:200] Out: [id number] [Urine:2700; Emesis/NG output:1240; Chest Tube:196] Intake/Output this shift: No intake/output data recorded.       Exam    General- alert and comfortable on vent   Lungs- clear without rales, wheezes   Cor- regular rate and rhythm, no murmur , gallop   Abdomen- soft, non-tender   Extremities - warm, non-tender, minimal edema   Neuro- oriented, appropriate, no focal weakness   Lab Results:  Recent Labs  10/31/15 0330 11/01/15 0350  WBC 14.5* 13.6*  HGB 8.9* 8.7*  HCT 26.6* 25.9*  PLT 143* 176   BMET:  Recent Labs  10/31/15 0330 11/01/15 0350  NA 137 140  K 3.9 3.7  CL 103 102  CO2 27 28  GLUCOSE 146* 132*  BUN 41* 56*  CREATININE 2.12* 2.22*  CALCIUM 7.4* 7.3*    PT/INR: No results for input(s): LABPROT, INR in the last 72  hours. ABG    Component Value Date/Time   PHART 7.412 10/31/2015 2053   HCO3 30.2 (H) 10/31/2015 2053   TCO2 32 10/31/2015 2053   ACIDBASEDEF 1.0 10/29/2015 1531   O2SAT 75.3 11/01/2015 0407   CBG (last 3)   Recent Labs  10/31/15 1928 10/31/15 2331 11/01/15 0319  GLUCAP 141* 122* 176*    Assessment/Plan: S/P Procedure(s) (LRB): CORONARY ARTERY BYPASS GRAFTING (CABG) x3(LIMA to LA, SVG to OM1, SVG to PDA) with EVH from the left thigh and partial lower leg greater saphenous vein and left internal mammary artery (N/A) TRANSESOPHAGEAL ECHOCARDIOGRAM (TEE) (N/A) INTRA-AORTIC BALLOON PUMP INSERTION size 40cc (Left) leave chest tubes until off vent May need trach this week  LOS: 7 days    Mark Rivers 11/01/2015

## 2015-11-01 NOTE — Progress Notes (Addendum)
Nutrition Follow Up  DOCUMENTATION CODES:   Obesity unspecified  INTERVENTION:   Given current Propofol infusion:   Decrease Vital High Protein to goal rate of 20 ml/hr   Add Prostat liquid protein 60 ml TID  Total TF regimen + Propofol infusion to provide 1508 kcals, 132 gm protein, 401 ml of free water  NUTRITION DIAGNOSIS:   Inadequate oral intake related to inability to eat as evidenced by NPO status, ongoing  GOAL:   Provide needs based on ASPEN/SCCM guidelines, met  MONITOR:   TF tolerance, Vent status, Labs, Weight trends, Skin, I & O's  ASSESSMENT:   81 yo WM with PMH significant for but not limited to, CAD with RCA stent 1988, IDDM,HTN, GERD; who has had increasing DOE for 3 weeks prior to admit. 10/16 he presented with acute chest pain, increased SOB and new Afib with RVR. CC revealed 99% Left main and he was taken urgently to CABGx 3 per CVTS 10/17 1730 and presented to SICU 2230.   Patient s/p procedure 10/16: CORONARY ARTERY BYPASS GRAFTING (CABG) x 3  INTRA-AORTIC BALLOON PUMP INSERTION  Patient is currently intubated on ventilator support Temp (24hrs), Avg:99.6 F (37.6 C), Min:99 F (37.2 C), Max:100 F (37.8 C)  Propofol at 16.2 ml/hr >> 428 fat kcals    Pt presented 10/17 with NSTEMI >> required urgent surgery. Developed severe hypoxia in AM 10/18. Vital High Protein infusing at goal rate of 65 ml/hr via CORTRAK feeding tube (tip in duodenum) >> tolerating well. Labs and medications reviewed.  Diet Order:  Diet NPO time specified   CBG (last 3)   Recent Labs  10/31/15 2331 11/01/15 0319 11/01/15 0844  GLUCAP 122* 176* 105*   Skin:  Reviewed, no issues  Last BM:  10/17  Height:   Ht Readings from Last 1 Encounters:  10/27/15 '5\' 6"'  (1.676 m)    Weight:   Wt Readings from Last 1 Encounters:  11/01/15 240 lb 4.8 oz (109 kg)    Ideal Body Weight:  59 kg  BMI:  Body mass index is 38.79 kg/m.  Estimated Nutritional Needs:    Kcal:  5852-7782  Protein:  130-140 gm  Fluid:  per MD  EDUCATION NEEDS:   No education needs identified at this time  [name], RD, LDN Pager #: [phone] After-Hours Pager #: [phone]

## 2015-11-01 NOTE — Progress Notes (Signed)
11/01/2015 1115 Dr. Prescott Gum paged and made aware of low BP post ETT exchange, will await call back for orders. Jadene Pierini also paged and made aware. Verbal order received to restart Milrinone at this time. Orders enacted. Will continue to closely monitor patient.  [place] Dr. Prescott Gum return call and made aware of low BP 90/40 MAP in 50's post ETT exchange, verbal order received to collect CBC, restart Neo as needed. Dr. Prescott Gum also made aware of Jadene Pierini [facility] order to restart Milrinone. No further orders at this time. Will continue to closely monitor patient.  Ricardo Kayes, Arville Lime

## 2015-11-01 NOTE — Progress Notes (Addendum)
PCCM PROGRESS NOTE   Admission date:  10/25/2015 Consult date:  10/27/2015 Referring provider:  Dr. Prescott Gum  CC:  Short of breath  Subjective:  Remains on vent sedated Neg 1 liter  Vital signs: BP (!) 115/48   Pulse 78   Temp 99 F (37.2 C) (Oral)   Resp 15   Ht 5\' 6"  (1.676 m)   Wt 109 kg (240 lb 4.8 oz)   SpO2 96%   BMI 38.79 kg/m    Intake/output: I/O last 3 completed shifts: In: 4687.9 [I.V.:2217.9; NG/GT:2270; IV [id number] Out: [id number] [Urine:4385; Emesis/NG output:1740; Chest Tube:256]  Hemodynamics: PAP: (38-56)/(21-34) 40/23 CVP:  [12 mmHg-31 mmHg] 12 mmHg  Vent settings: Vent Mode: PCV FiO2 (%):  [55 %-79 %] 65 % Set Rate:  [16 bmp] 16 bmp Vt Set:  [510 mL] 510 mL PEEP:  [10 cmH20] 10 cmH20 Plateau Pressure:  [14 cmH20-19 cmH20] 18 cmH20   General: ill appearing Neuro: RASS -3 HEENT: ETT in place Cardiac: irregular Chest:distant coarse Abd: soft, decreased BS, no r/g Ext: 1+ edema unchanged Skin: no rashes  LABS: CMP Latest Ref Rng & Units 11/01/2015 10/31/2015 10/30/2015  Glucose 65 - 99 mg/dL 132(H) 146(H) 135(H)  BUN 6 - 20 mg/dL 56(H) 41(H) -  Creatinine 0.61 - 1.24 mg/dL 2.22(H) 2.12(H) -  Sodium 135 - 145 mmol/L 140 137 140  Potassium 3.5 - 5.1 mmol/L 3.7 3.9 3.9  Chloride 101 - 111 mmol/L 102 103 -  CO2 22 - 32 mmol/L 28 27 -  Calcium 8.9 - 10.3 mg/dL 7.3(L) 7.4(L) -  Total Protein 6.5 - 8.1 g/dL 5.2(L) 5.2(L) -  Total Bilirubin 0.3 - 1.2 mg/dL 0.4 0.7 -  Alkaline Phos 38 - 126 U/L 78 69 -  AST 15 - 41 U/L 36 37 -  ALT 17 - 63 U/L 22 22 -    CBC Latest Ref Rng & Units 11/01/2015 10/31/2015 10/30/2015  WBC 4.0 - 10.5 K/uL 13.6(H) 14.5(H) -  Hemoglobin 13.0 - 17.0 g/dL 8.7(L) 8.9(L) 10.9(L)  Hematocrit 39.0 - 52.0 % 25.9(L) 26.6(L) 32.0(L)  Platelets 150 - 400 K/uL 176 143(L) -    ABG    Component Value Date/Time   PHART 7.353 11/01/2015 0816   PCO2ART 51.9 (H) 11/01/2015 0816   PO2ART 64.0 (L) 11/01/2015 0816   HCO3  28.7 (H) 11/01/2015 0816   TCO2 30 11/01/2015 0816   ACIDBASEDEF 1.0 10/29/2015 1531   O2SAT 90.0 11/01/2015 0816    CBG (last 3)   Recent Labs  10/31/15 2331 11/01/15 0319 11/01/15 0844  GLUCAP 122* 176* 105*    Imaging: Dg Chest Port 1 View  Result Date: 11/01/2015 CLINICAL DATA:  Intubation. EXAM: PORTABLE CHEST 1 VIEW COMPARISON:  10/31/2015. FINDINGS: Interim removal Swan-Ganz catheter. Endotracheal tube, NG tube, right PICC line, bilateral chest tubes in stable position. Prior CABG. Cardiomegaly with bilateral pulmonary alveolar infiltrates and pleural effusions consistent with congestive heart failure. Findings have progressed from prior exam. IMPRESSION: 1. Interim removal Swan-Ganz catheter. Remaining lines and tubes including bilateral chest tubes in stable position. No pneumothorax. 2. Prior CABG. Cardiomegaly with progressive bilateral pulmonary infiltrates consistent pulmonary edema. Progressive bilateral pleural effusions are also noted . Electronically Signed   By: Marcello Moores  Register   On: 11/01/2015 08:08   Dg Chest Port 1 View  Result Date: 10/31/2015 CLINICAL DATA:  PICC line placement EXAM: PORTABLE CHEST 1 VIEW COMPARISON:  10/31/2015 FINDINGS: Cardiomediastinal silhouette is stable. Slight improvement in aeration. Bilateral chest tubes are unchanged  in position. No pulmonary edema. Bilateral basilar atelectasis or infiltrate. Stable endotracheal and NG tube position. Stable right Swan-Ganz catheter position. There is right arm PICC line with tip in distal SVC. No pneumothorax. IMPRESSION: Stable support apparatus. Right arm PICC line with tip in distal SVC. No pneumothorax. Electronically Signed   By: Lahoma Crocker M.D.   On: 10/31/2015 11:46   Dg Chest Port 1 View  Result Date: 10/31/2015 CLINICAL DATA:  Respiratory failure EXAM: PORTABLE CHEST 1 VIEW COMPARISON:  10/30/2015 FINDINGS: Cardiac shadow is stable. Postsurgical changes are again seen. The endotracheal tube,  nasogastric catheter and feeding catheter are noted in satisfactory position. Swan-Ganz catheter is noted in pulmonary outflow tract. Bilateral thoracostomy catheters are again seen. Bilateral pleural effusions are noted left greater than right. No bony abnormality is seen. IMPRESSION: Bilateral pleural effusions left greater than right. Tubes and lines as described. Electronically Signed   By: Inez Catalina M.D.   On: 10/31/2015 09:06    Studies:   Cultures: 10/18 sputum>>NF 10/19 sputum>>>NF 10/18 bcx 2>>  Antibiotics: 10/17 levaquin >> 10/17 Vancomycin >> 10/18  Events: 10/17 Admit, cardiac cath, emergent CABG 10/18 0600 PCCM consulted for refractory hypoxia  Lines/tubes: 10/17 ETT>> 10/17 CT's x 3>> 10/17 Lt rad a line>> 10/17 Rt IJ PA cath>>out  Summary: 81 yo male presented with increasing dyspnea and a fib with RVR.  Found to have 99% Lt main CAD and had emergent CABG 10/17.  Developed refractory hypoxia post-op and PCCM consulted.  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  Assessment/plan:  Acute hypoxic respiratory failure from HCAP and pulmonary edema. Hx of COPD. - neg balance as tolerated - Pulmicort, duoneb -levaquin, consider 7-8 days -peep remains 10, 65% to goal 50% then peep reduction as able -maintain drainage chest effusions, repeat pcxr -keep same MV PC, [ph noted -will likely need trach, will assess for bedisde, but follow peep needs with neg balance next 48 hrs, prior to decision   NSTEMI s/p CABG. Cardiogenic shock. - continue diuresis per TCTS, keep same neg balance - may need reduction however, follow crt trend - dopmaine per cvts, consider dc - continue ASA, lipitor, lopressor  AKI. Chronic kidney disease 2 >> baseline creatine 1.20. - may need lasix recuction , follow trend closely  Nutrition. Hx of GERD - Tube feeds while on vent, at goal - Protonix for SUP  Anemia  after surgery and critical illness.  - F/u CBC with neg balance - Transfuse for Hb < 7 or bleeding  DM type II. - SSI with levemir  Acute metabolic encephalopathy. - RASS goal: -2 , prop with WUA -fent wua  ID Presumed hcap Consider dc today at day 7-8, remains culture neg  Ccm time 35 min   Lavon Paganini. Titus Mould, MD, [place] Pgr: [facility]

## 2015-11-01 NOTE — Progress Notes (Signed)
ETT changed out for new tube due to blown cuff. Replaced with 7.5 ETT secured at 25 at lip. Pt tol well. No complications. VS stable. Will cont to monitor

## 2015-11-02 ENCOUNTER — Inpatient Hospital Stay (HOSPITAL_COMMUNITY): Payer: Medicare Other

## 2015-11-02 ENCOUNTER — Encounter (HOSPITAL_COMMUNITY): Payer: Medicare Other

## 2015-11-02 DIAGNOSIS — Z419 Encounter for procedure for purposes other than remedying health state, unspecified: Secondary | ICD-10-CM

## 2015-11-02 DIAGNOSIS — I509 Heart failure, unspecified: Secondary | ICD-10-CM

## 2015-11-02 LAB — CBC
HCT: 25.8 % — ABNORMAL LOW (ref 39.0–52.0)
Hemoglobin: 8.4 g/dL — ABNORMAL LOW (ref 13.0–17.0)
MCH: 30.2 pg (ref 26.0–34.0)
MCHC: 32.6 g/dL (ref 30.0–36.0)
MCV: 92.8 fL (ref 78.0–100.0)
Platelets: 205 10*3/uL (ref 150–400)
RBC: 2.78 MIL/uL — ABNORMAL LOW (ref 4.22–5.81)
RDW: 16.5 % — ABNORMAL HIGH (ref 11.5–15.5)
WBC: 13.5 10*3/uL — ABNORMAL HIGH (ref 4.0–10.5)

## 2015-11-02 LAB — GLUCOSE, CAPILLARY
Glucose-Capillary: 106 mg/dL — ABNORMAL HIGH (ref 65–99)
Glucose-Capillary: 107 mg/dL — ABNORMAL HIGH (ref 65–99)
Glucose-Capillary: 110 mg/dL — ABNORMAL HIGH (ref 65–99)
Glucose-Capillary: 116 mg/dL — ABNORMAL HIGH (ref 65–99)
Glucose-Capillary: 140 mg/dL — ABNORMAL HIGH (ref 65–99)
Glucose-Capillary: 142 mg/dL — ABNORMAL HIGH (ref 65–99)
Glucose-Capillary: 158 mg/dL — ABNORMAL HIGH (ref 65–99)

## 2015-11-02 LAB — ECHOCARDIOGRAM COMPLETE
AO mean calculated velocity dopler: 173 cm/s
AV Area VTI index: 0.71 cm2/m2
AV Area VTI: 1.57 cm2
AV Area mean vel: 1.57 cm2
AV Mean grad: 14 mmHg
AV Peak grad: 34 mmHg
AV VEL mean LVOT/AV: 0.41
AV area mean vel ind: 0.73 cm2/m2
AV peak Index: 0.73
AV pk vel: 291 cm/s
AV vel: 1.54
Ao pk vel: 0.41 m/s
E decel time: 264 msec
E/e' ratio: 14.5
FS: 24 % — AB (ref 28–44)
Height: 66 in
IVS/LV PW RATIO, ED: 0.94
LA ID, A-P, ES: 45 mm
LA diam end sys: 45 mm
LA diam index: 2.08 cm/m2
LA vol A4C: 74 ml
LA vol index: 36.1 mL/m2
LA vol: 78 mL
LV E/e' medial: 14.5
LV E/e'average: 14.5
LV PW d: 15.1 mm — AB (ref 0.6–1.1)
LV dias vol index: 54 mL/m2
LV dias vol: 117 mL (ref 62–150)
LV e' LATERAL: 7.24 cm/s
LV sys vol index: 26 mL/m2
LV sys vol: 56 mL (ref 21–61)
LVOT SV: 84 mL
LVOT VTI: 22 cm
LVOT area: 3.8 cm2
LVOT diameter: 22 mm
LVOT peak VTI: 0.41 cm
LVOT peak grad rest: 6 mmHg
LVOT peak vel: 120 cm/s
Lateral S' vel: 6.36 cm/s
MV Dec: 264
MV Peak grad: 4 mmHg
MV pk A vel: 59.7 m/s
MV pk E vel: 105 m/s
Simpson's disk: 52
Stroke v: 61 ml
TDI e' lateral: 7.24
TDI e' medial: 4.83
VTI: 54.3 cm
Valve area index: 0.71
Valve area: 1.54 cm2
Weight: 3841.3 oz

## 2015-11-02 LAB — BLOOD GAS, ARTERIAL
Acid-Base Excess: 5.2 mmol/L — ABNORMAL HIGH (ref 0.0–2.0)
Bicarbonate: 29.8 mmol/L — ABNORMAL HIGH (ref 20.0–28.0)
Drawn by: 36496
FIO2: 40
MECHVT: 510 mL
O2 Saturation: 90 %
PEEP: 10 cmH2O
Patient temperature: 98.6
pCO2 arterial: 49.5 mmHg — ABNORMAL HIGH (ref 32.0–48.0)
pH, Arterial: 7.397 (ref 7.350–7.450)
pO2, Arterial: 58.8 mmHg — ABNORMAL LOW (ref 83.0–108.0)

## 2015-11-02 LAB — COMPREHENSIVE METABOLIC PANEL
ALT: 25 U/L (ref 17–63)
AST: 52 U/L — ABNORMAL HIGH (ref 15–41)
Albumin: 2.1 g/dL — ABNORMAL LOW (ref 3.5–5.0)
Alkaline Phosphatase: 83 U/L (ref 38–126)
Anion gap: 9 (ref 5–15)
BUN: 81 mg/dL — ABNORMAL HIGH (ref 6–20)
CO2: 29 mmol/L (ref 22–32)
Calcium: 7.2 mg/dL — ABNORMAL LOW (ref 8.9–10.3)
Chloride: 102 mmol/L (ref 101–111)
Creatinine, Ser: 2.28 mg/dL — ABNORMAL HIGH (ref 0.61–1.24)
GFR calc Af Amer: 30 mL/min — ABNORMAL LOW (ref >60)
GFR calc non Af Amer: 26 mL/min — ABNORMAL LOW (ref >60)
Glucose, Bld: 120 mg/dL — ABNORMAL HIGH (ref 65–99)
Potassium: 3.6 mmol/L (ref 3.5–5.1)
Sodium: 140 mmol/L (ref 135–145)
Total Bilirubin: 0.7 mg/dL (ref 0.3–1.2)
Total Protein: 5.1 g/dL — ABNORMAL LOW (ref 6.5–8.1)

## 2015-11-02 LAB — POCT I-STAT, CHEM 8
BUN: 88 mg/dL — ABNORMAL HIGH (ref 6–20)
Calcium, Ion: 1.01 mmol/L — ABNORMAL LOW (ref 1.15–1.40)
Chloride: 101 mmol/L (ref 101–111)
Creatinine, Ser: 2.1 mg/dL — ABNORMAL HIGH (ref 0.61–1.24)
Glucose, Bld: 111 mg/dL — ABNORMAL HIGH (ref 65–99)
HCT: 25 % — ABNORMAL LOW (ref 39.0–52.0)
Hemoglobin: 8.5 g/dL — ABNORMAL LOW (ref 13.0–17.0)
Potassium: 3.8 mmol/L (ref 3.5–5.1)
Sodium: 143 mmol/L (ref 135–145)
TCO2: 31 mmol/L (ref 0–100)

## 2015-11-02 LAB — URINALYSIS, ROUTINE W REFLEX MICROSCOPIC
BILIRUBIN URINE: NEGATIVE
GLUCOSE, UA: NEGATIVE mg/dL
HGB URINE DIPSTICK: NEGATIVE
KETONES UR: NEGATIVE mg/dL
Leukocytes, UA: NEGATIVE
NITRITE: NEGATIVE
PH: 5.5 (ref 5.0–8.0)
Protein, ur: NEGATIVE mg/dL
SPECIFIC GRAVITY, URINE: 1.013 (ref 1.005–1.030)

## 2015-11-02 LAB — COOXEMETRY PANEL
Carboxyhemoglobin: 1.1 % (ref 0.5–1.5)
Methemoglobin: 0.6 % (ref 0.0–1.5)
O2 Saturation: 64.9 %
Total hemoglobin: 19.3 g/dL — ABNORMAL HIGH (ref 12.0–16.0)

## 2015-11-02 LAB — TRIGLYCERIDES: Triglycerides: 65 mg/dL (ref <150)

## 2015-11-02 MED ORDER — LEVALBUTEROL HCL 0.63 MG/3ML IN NEBU
0.6300 mg | INHALATION_SOLUTION | Freq: Four times a day (QID) | RESPIRATORY_TRACT | Status: DC | PRN
  Administered 2015-11-16 – 2015-11-24 (×3): 0.63 mg via RESPIRATORY_TRACT
  Filled 2015-11-02 (×3): qty 3

## 2015-11-02 MED ORDER — LEVALBUTEROL HCL 0.63 MG/3ML IN NEBU: 0.6300 mg | INHALATION_SOLUTION | Freq: Three times a day (TID) | RESPIRATORY_TRACT | Status: DC

## 2015-11-02 MED ORDER — LEVALBUTEROL HCL 0.63 MG/3ML IN NEBU
0.6300 mg | INHALATION_SOLUTION | Freq: Three times a day (TID) | RESPIRATORY_TRACT | Status: DC
  Administered 2015-11-03 – 2015-11-26 (×67): 0.63 mg via RESPIRATORY_TRACT
  Filled 2015-11-02 (×68): qty 3

## 2015-11-02 MED ORDER — DEXTROSE 5 % IV SOLN
2.0000 g | Freq: Three times a day (TID) | INTRAVENOUS | Status: AC
  Administered 2015-11-02 – 2015-11-07 (×17): 2 g via INTRAVENOUS
  Filled 2015-11-02 (×23): qty 2

## 2015-11-02 MED ORDER — BACIT-POLY-NEO HC 1 % EX OINT
TOPICAL_OINTMENT | Freq: Two times a day (BID) | CUTANEOUS | Status: DC
  Administered 2015-11-02 – 2015-11-03 (×2): via TOPICAL
  Administered 2015-11-03: 1 via TOPICAL
  Administered 2015-11-04: 10:00:00 via TOPICAL
  Filled 2015-11-02 (×2): qty 15

## 2015-11-02 MED ORDER — ENOXAPARIN SODIUM 30 MG/0.3ML ~~LOC~~ SOLN
30.0000 mg | SUBCUTANEOUS | Status: DC
  Administered 2015-11-02 – 2015-11-08 (×6): 30 mg via SUBCUTANEOUS
  Filled 2015-11-02 (×6): qty 0.3

## 2015-11-02 MED ORDER — INSULIN DETEMIR 100 UNIT/ML ~~LOC~~ SOLN
8.0000 [IU] | Freq: Two times a day (BID) | SUBCUTANEOUS | Status: DC
  Administered 2015-11-02 – 2015-11-05 (×7): 8 [IU] via SUBCUTANEOUS
  Filled 2015-11-02 (×11): qty 0.08

## 2015-11-02 MED ORDER — POTASSIUM CHLORIDE 10 MEQ/50ML IV SOLN
10.0000 meq | INTRAVENOUS | Status: AC
  Administered 2015-11-02 (×3): 10 meq via INTRAVENOUS
  Filled 2015-11-02 (×3): qty 50

## 2015-11-02 MED ORDER — METOCLOPRAMIDE HCL 5 MG/ML IJ SOLN
10.0000 mg | Freq: Four times a day (QID) | INTRAMUSCULAR | Status: DC
  Administered 2015-11-02 – 2015-11-05 (×13): 10 mg via INTRAVENOUS
  Filled 2015-11-02 (×14): qty 2

## 2015-11-02 MED ORDER — FUROSEMIDE 10 MG/ML IJ SOLN
40.0000 mg | Freq: Four times a day (QID) | INTRAMUSCULAR | Status: DC
  Administered 2015-11-02 (×2): 40 mg via INTRAVENOUS
  Filled 2015-11-02 (×2): qty 4

## 2015-11-02 MED ORDER — SORBITOL 70 % PO SOLN
60.0000 mL | Freq: Once | ORAL | Status: AC
  Administered 2015-11-02: 60 mL
  Filled 2015-11-02: qty 60

## 2015-11-02 NOTE — Progress Notes (Signed)
Pharmacy Antibiotic Note  Mark Rivers is a 81 y.o. male admitted on 10/25/2015 with pneumonia.  Pharmacy has been consulted for aztreonam dosing. Previously treated with 7 days of levofloxacin, still spiking fevers up to 102, wbc 13.5 stable. Multiple antibiotic allergies listed as "passing out" with amoxicillin, PCN, cephalexin. CrCl~30.  Plan: Aztreonam 2g IV q8h - borderline for dosage reduction Monitor clinical progress, c/s, renal function, abx plan/LOT   Height: 5\' 6"  (167.6 cm) Weight: 240 lb 1.3 oz (108.9 kg) IBW/kg (Calculated) : 63.8  Temp (24hrs), Avg:100.3 F (37.9 C), Min:99.5 F (37.5 C), Max:102 F (38.9 C)   Recent Labs Lab 10/27/15 0830  10/28/15 0300  10/30/15 0344 10/30/15 1800 10/31/15 0330 11/01/15 0350 11/01/15 1230 11/01/15 1818 11/02/15 0400  WBC  --   < > 23.2*  < > 14.8* 16.1* 14.5* 13.6* 13.5*  --  13.5*  CREATININE  --   < > 1.96*  < > 1.92*  --  2.12* 2.22*  --  2.30* 2.28*  LATICACIDVEN 4.5*  --  1.1  --   --   --   --   --   --   --   --   < > = values in this interval not displayed.  Estimated Creatinine Clearance: 30.4 mL/min (by C-G formula based on SCr of 2.28 mg/dL (H)).    Allergies  Allergen Reactions  . Amoxicillin Other (See Comments)    PATIENT PASSES OUT!!!!  . Cephalexin Other (See Comments)    PATIENT PASSES OUT!!!!  . Penicillin G Other (See Comments)    Has patient had a PCN reaction causing immediate rash, facial/tongue/throat swelling, SOB or lightheadedness with hypotension: Yes Has patient had a PCN reaction causing severe rash involving mucus membranes or skin necrosis: No Has patient had a PCN reaction that required hospitalization: Yes Has patient had a PCN reaction occurring within the last 10 years: Yes If all of the above answers are "NO", then may proceed with Cephalosporin use. PATIENT PASSES OUT!!!!    Antimicrobials this admission:  Levofloxacin 10/17>>10/23 Vancomycin 10/17>>10/18 Aztreonam  10/24>>  Dose adjustments this admission:   Microbiology results:  10/18 MRSA PCR neg 10/18 Resp: normal flora 10/19 Resp: normal flora 10/18 Blood x 2: neg   Elicia Lamp, PharmD, BCPS Clinical Pharmacist 11/02/2015 9:47 AM

## 2015-11-02 NOTE — Care Management Important Message (Signed)
Important Message  Patient Details  Name: Mark Rivers MRN: [id number] Date of Birth: 1934-03-07   Medicare Important Message Given:  Yes    [name] [address] 11/02/2015, 12:34 PM

## 2015-11-02 NOTE — Progress Notes (Signed)
  Echocardiogram 2D Echocardiogram has been performed.  Mark Rivers 11/02/2015, 3:46 PM

## 2015-11-02 NOTE — Progress Notes (Addendum)
PCCM PROGRESS NOTE   Admission date:  10/25/2015 Consult date:  10/27/2015 Referring provider:  Dr. Prescott Gum  CC:  Short of breath  Subjective:  Blown cuff ETT changed 10/23 Remains on peep 10  Vital signs: BP (!) 109/51   Pulse 72   Temp 100 F (37.8 C) (Axillary)   Resp 13   Ht 5\' 6"  (1.676 m)   Wt 108.9 kg (240 lb 1.3 oz)   SpO2 97%   BMI 38.75 kg/m    Intake/output: I/O last 3 completed shifts: In: 4438.6 [I.V.:1971.2; NG/GT:2317.4; IV Piggyback:150] Out: 5311 [Urine:4405; Emesis/NG output:650; Chest Tube:256]  Hemodynamics:    Vent settings: Vent Mode: PCV FiO2 (%):  [40 %-65 %] 55 % Set Rate:  [16 bmp] 16 bmp Vt Set:  [510 mL] 510 mL PEEP:  [10 cmH20] 10 cmH20 Plateau Pressure:  [18 cmH20-20 cmH20] 20 cmH20   General: ill appearing Neuro: RASS -3 HEENT: ETT , obese neck Cardiac: s1 s2 IRR not tachy Chest: coarse distant Abd: soft, present BS, no r/g Ext: 1+ edema unchanged Skin: no rashes  LABS: CMP Latest Ref Rng & Units 11/02/2015 11/01/2015 11/01/2015  Glucose 65 - 99 mg/dL 120(H) 137(H) 132(H)  BUN 6 - 20 mg/dL 81(H) 67(H) 56(H)  Creatinine 0.61 - 1.24 mg/dL 2.28(H) 2.30(H) 2.22(H)  Sodium 135 - 145 mmol/L 140 141 140  Potassium 3.5 - 5.1 mmol/L 3.6 3.9 3.7  Chloride 101 - 111 mmol/L 102 100(L) 102  CO2 22 - 32 mmol/L 29 - 28  Calcium 8.9 - 10.3 mg/dL 7.2(L) - 7.3(L)  Total Protein 6.5 - 8.1 g/dL 5.1(L) - 5.2(L)  Total Bilirubin 0.3 - 1.2 mg/dL 0.7 - 0.4  Alkaline Phos 38 - 126 U/L 83 - 78  AST 15 - 41 U/L 52(H) - 36  ALT 17 - 63 U/L 25 - 22    CBC Latest Ref Rng & Units 11/02/2015 11/01/2015 11/01/2015  WBC 4.0 - 10.5 K/uL 13.5(H) - 13.5(H)  Hemoglobin 13.0 - 17.0 g/dL 8.4(L) 8.5(L) 8.4(L)  Hematocrit 39.0 - 52.0 % 25.8(L) 25.0(L) 25.3(L)  Platelets 150 - 400 K/uL 205 - 185    ABG    Component Value Date/Time   PHART 7.397 11/02/2015 0432   PCO2ART 49.5 (H) 11/02/2015 0432   PO2ART 58.8 (L) 11/02/2015 0432   HCO3 29.8 (H)  11/02/2015 0432   TCO2 30 11/01/2015 1818   ACIDBASEDEF 1.0 10/29/2015 1531   O2SAT 64.9 11/02/2015 0436    CBG (last 3)   Recent Labs  11/01/15 2342 11/02/15 0411 11/02/15 0741  GLUCAP 134* 110* 106*    Imaging: Dg Chest Port 1 View  Result Date: 11/02/2015 CLINICAL DATA:  Endotracheal tube present EXAM: PORTABLE CHEST 1 VIEW COMPARISON:  11/01/2015 FINDINGS: Endotracheal tube 2.5 cm from carina NG tube unchanged. Bilateral chest tubes unchanged. Bibasilar atelectasis and small effusions unchanged. No pneumothorax. IMPRESSION: 1. Stable support apparatus. 2. No significant change with bibasilar atelectasis and effusions. 3. Chest tubes in place without pneumothorax . Electronically Signed   By: Suzy Bouchard M.D.   On: 11/02/2015 08:59   Dg Chest Port 1 View  Result Date: 11/01/2015 CLINICAL DATA:  Intubated. EXAM: PORTABLE CHEST 1 VIEW COMPARISON:  11/01/2015 FINDINGS: Endotracheal tube is 3 cm above the carina. Bilateral chest tubes in place. No pneumothorax. Right PICC line and NG tube are unchanged. There is cardiomegaly with vascular congestion. Bilateral lower lobe airspace opacities and layering effusions. Slight improvement in aeration since prior study. IMPRESSION: Layering bilateral  effusions with bilateral lower lobe atelectasis or infiltrates, slightly improved since prior study. Support devices stable. Electronically Signed   By: Rolm Baptise M.D.   On: 11/01/2015 12:09   Dg Chest Port 1 View  Result Date: 11/01/2015 CLINICAL DATA:  Intubation. EXAM: PORTABLE CHEST 1 VIEW COMPARISON:  10/31/2015. FINDINGS: Interim removal Swan-Ganz catheter. Endotracheal tube, NG tube, right PICC line, bilateral chest tubes in stable position. Prior CABG. Cardiomegaly with bilateral pulmonary alveolar infiltrates and pleural effusions consistent with congestive heart failure. Findings have progressed from prior exam. IMPRESSION: 1. Interim removal Swan-Ganz catheter. Remaining lines and  tubes including bilateral chest tubes in stable position. No pneumothorax. 2. Prior CABG. Cardiomegaly with progressive bilateral pulmonary infiltrates consistent pulmonary edema. Progressive bilateral pleural effusions are also noted . Electronically Signed   By: Marcello Moores  Register   On: 11/01/2015 08:08   Dg Chest Port 1 View  Result Date: 10/31/2015 CLINICAL DATA:  PICC line placement EXAM: PORTABLE CHEST 1 VIEW COMPARISON:  10/31/2015 FINDINGS: Cardiomediastinal silhouette is stable. Slight improvement in aeration. Bilateral chest tubes are unchanged in position. No pulmonary edema. Bilateral basilar atelectasis or infiltrate. Stable endotracheal and NG tube position. Stable right Swan-Ganz catheter position. There is right arm PICC line with tip in distal SVC. No pneumothorax. IMPRESSION: Stable support apparatus. Right arm PICC line with tip in distal SVC. No pneumothorax. Electronically Signed   By: Lahoma Crocker M.D.   On: 10/31/2015 11:46    Studies:   Cultures: 10/18 sputum>>NF 10/19 sputum>>>NF 10/18 bcx 2>>  Antibiotics: 10/17 levaquin >>> 10/17 Vancomycin >> 10/18  Events: 10/17 Admit, cardiac cath, emergent CABG 10/18 0600 PCCM consulted for refractory hypoxia  Lines/tubes: 10/17 ETT>> 10/17 CT's x 3>> 10/17 Lt rad a line>>10/23 10/17 Rt IJ PA cath>>out  Summary: 81 yo male presented with increasing dyspnea and a fib with RVR.  Found to have 99% Lt main CAD and had emergent CABG 10/17.  Developed refractory hypoxia post-op and PCCM consulted.  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  Assessment/plan:  Acute hypoxic respiratory failure from HCAP and pulmonary edema. Hx of COPD. PFO- shunt? - neg balance as tolerated, need further -keep MV same , ph wnl - Pulmicort, duoneb -levaquin, consider 7-8 days -peep remains 10, 55% - with PFO will drop peep and see if any affect / desaturation  -need further  neg balance, was even last 24 hours -would also welcome CPAP 8, ps 5-10 today  NSTEMI s/p CABG. Cardiogenic shock. -continued lasix, but increase - dopmaine per cvts, consider dc, for my purpose no need dopmaine - continue ASA, lipitor, lopressor  AKI. Chronic kidney disease 2 >> baseline creatine 1.20. - appears with edema still on pcxr -lasix increase to q6h, he makes good output with current dose  Nutrition. Hx of GERD - Tube feeds while on vent, more distention, trickle and off volume based - Protonix for SUP -BM needed, supp  Anemia after surgery and critical illness.  - F/u CBC with neg balance - lasix also may concentrate -repeat coags for possible trach this week  DM type II controlled - SSI with levemir, may need redcution  Acute metabolic encephalopathy. - RASS goal: -2 , prop with WUA -fent wua fully needed  ID Presumed hcap, some fevers Repeat sputum, dc levo, add Imipenem Send BC, UA  Ccm time 35 min  I updated family in [place]. Titus Mould, MD, [place] Pgr: [phone] [facility]  [facility]

## 2015-11-02 NOTE — Progress Notes (Signed)
7 Days Post-Op Procedure(s) (LRB): CORONARY ARTERY BYPASS GRAFTING (CABG) x3(LIMA to LA, SVG to OM1, SVG to PDA) with EVH from the left thigh and partial lower leg greater saphenous vein and left internal mammary artery (N/A) TRANSESOPHAGEAL ECHOCARDIOGRAM (TEE) (N/A) INTRA-AORTIC BALLOON PUMP INSERTION size 40cc (Left) Subjective: pulmonary status with general improvement -now on FiO2 50% and 5 of PEEP Transthoracic echo today shows normal LV EF, no significant valvular disease, no comment on PFO, no pericardial effusion Patient is otherwise patient is stable  Objective: Vital signs in last 24 hours: Temp:  [98.3 F (36.8 C)-102 F (38.9 C)] 98.3 F (36.8 C) (10/24 1544) Pulse Rate:  [60-79] 71 (10/24 1845) Cardiac Rhythm: Normal sinus rhythm (10/24 1600) Resp:  [9-24] 16 (10/24 1845) BP: (90-132)/(42-79) 111/45 (10/24 1845) SpO2:  [89 %-100 %] 93 % (10/24 1845) FiO2 (%):  [40 %-65 %] 50 % (10/24 1600) Weight:  [240 lb 1.3 oz (108.9 kg)] 240 lb 1.3 oz (108.9 kg) (10/24 0500)  Hemodynamic parameters for last 24 hours:    Intake/Output from previous day: 10/23 0701 - 10/24 0700 In: 3037.3 [I.V.:1384.9; NG/GT:1602.4; IV Piggyback:50] Out: 3400 [Urine:3100; Emesis/NG output:200; Chest Tube:100] Intake/Output this shift: No intake/output data recorded.       Exam    General- alert and comfortable   Lungs- clear without rales, wheezes   Cor- regular rate and rhythm, no murmur , gallop   Abdomen- soft, non-tender   Extremities - warm, non-tender, minimal edema   Neuro- oriented, appropriate, no focal weakness   Lab Results:  Recent Labs  11/01/15 1230  11/02/15 0400 11/02/15 1601  WBC 13.5*  --  13.5*  --   HGB 8.4*  < > 8.4* 8.5*  HCT 25.3*  < > 25.8* 25.0*  PLT 185  --  205  --   < > = values in this interval not displayed. BMET:  Recent Labs  11/01/15 0350  11/02/15 0400 11/02/15 1601  NA 140  < > 140 143  K 3.7  < > 3.6 3.8  CL 102  < > 102 101  CO2 28   --  29  --   GLUCOSE 132*  < > 120* 111*  BUN 56*  < > 81* 88*  CREATININE 2.22*  < > 2.28* 2.10*  CALCIUM 7.3*  --  7.2*  --   < > = values in this interval not displayed.  PT/INR: No results for input(s): LABPROT, INR in the last 72 hours. ABG    Component Value Date/Time   PHART 7.397 11/02/2015 0432   HCO3 29.8 (H) 11/02/2015 0432   TCO2 31 11/02/2015 1601   ACIDBASEDEF 1.0 10/29/2015 1531   O2SAT 64.9 11/02/2015 0436   CBG (last 3)   Recent Labs  11/02/15 0741 11/02/15 1202 11/02/15 1542  GLUCAP 106* 116* 107*    Assessment/Plan: S/P Procedure(s) (LRB): CORONARY ARTERY BYPASS GRAFTING (CABG) x3(LIMA to LA, SVG to OM1, SVG to PDA) with EVH from the left thigh and partial lower leg greater saphenous vein and left internal mammary artery (N/A) TRANSESOPHAGEAL ECHOCARDIOGRAM (TEE) (N/A) INTRA-AORTIC BALLOON PUMP INSERTION size 40cc (Left) Bun up to 88 and CXR clear - hold lasix Add reglan  LOS: 8 days    Tharon Aquas Trigt III 11/02/2015

## 2015-11-03 ENCOUNTER — Inpatient Hospital Stay (HOSPITAL_COMMUNITY): Payer: Medicare Other

## 2015-11-03 DIAGNOSIS — J9600 Acute respiratory failure, unspecified whether with hypoxia or hypercapnia: Secondary | ICD-10-CM

## 2015-11-03 LAB — CBC
HCT: 26.3 % — ABNORMAL LOW (ref 39.0–52.0)
Hemoglobin: 8.4 g/dL — ABNORMAL LOW (ref 13.0–17.0)
MCH: 29.7 pg (ref 26.0–34.0)
MCHC: 31.9 g/dL (ref 30.0–36.0)
MCV: 92.9 fL (ref 78.0–100.0)
Platelets: 278 10*3/uL (ref 150–400)
RBC: 2.83 MIL/uL — ABNORMAL LOW (ref 4.22–5.81)
RDW: 16.2 % — ABNORMAL HIGH (ref 11.5–15.5)
WBC: 13.8 10*3/uL — ABNORMAL HIGH (ref 4.0–10.5)

## 2015-11-03 LAB — BLOOD GAS, ARTERIAL
Acid-Base Excess: 5.1 mmol/L — ABNORMAL HIGH (ref 0.0–2.0)
Acid-Base Excess: 5.9 mmol/L — ABNORMAL HIGH (ref 0.0–2.0)
Bicarbonate: 29.8 mmol/L — ABNORMAL HIGH (ref 20.0–28.0)
Bicarbonate: 29.9 mmol/L — ABNORMAL HIGH (ref 20.0–28.0)
Drawn by: 36496
Drawn by: 280981
FIO2: 60
FIO2: 100
O2 Content: 15 L/min
O2 Saturation: 87.3 %
O2 Saturation: 87.3 %
PEEP: 5 cmH2O
Patient temperature: 99.7
Patient temperature: 98.6
Pressure control: 14 cmH2O
RATE: 16 resp/min
pCO2 arterial: 51.9 mmHg — ABNORMAL HIGH (ref 32.0–48.0)
pCO2 arterial: 44.1 mmHg (ref 32.0–48.0)
pH, Arterial: 7.381 (ref 7.350–7.450)
pH, Arterial: 7.447 (ref 7.350–7.450)
pO2, Arterial: 56.5 mmHg — ABNORMAL LOW (ref 83.0–108.0)
pO2, Arterial: 51.9 mmHg — ABNORMAL LOW (ref 83.0–108.0)

## 2015-11-03 LAB — POCT I-STAT, CHEM 8
BUN: 97 mg/dL — ABNORMAL HIGH (ref 6–20)
Calcium, Ion: 0.89 mmol/L — CL (ref 1.15–1.40)
Chloride: 98 mmol/L — ABNORMAL LOW (ref 101–111)
Creatinine, Ser: 2 mg/dL — ABNORMAL HIGH (ref 0.61–1.24)
Glucose, Bld: 196 mg/dL — ABNORMAL HIGH (ref 65–99)
HCT: 24 % — ABNORMAL LOW (ref 39.0–52.0)
Hemoglobin: 8.2 g/dL — ABNORMAL LOW (ref 13.0–17.0)
Potassium: 3 mmol/L — ABNORMAL LOW (ref 3.5–5.1)
Sodium: 138 mmol/L (ref 135–145)
TCO2: 28 mmol/L (ref 0–100)

## 2015-11-03 LAB — BLOOD CULTURE ID PANEL (REFLEXED)
ACINETOBACTER BAUMANNII: NOT DETECTED
CANDIDA KRUSEI: NOT DETECTED
CARBAPENEM RESISTANCE: NOT DETECTED
Candida albicans: NOT DETECTED
Candida glabrata: NOT DETECTED
Candida parapsilosis: NOT DETECTED
Candida tropicalis: NOT DETECTED
ENTEROBACTERIACEAE SPECIES: NOT DETECTED
Enterobacter cloacae complex: NOT DETECTED
Enterococcus species: NOT DETECTED
Escherichia coli: NOT DETECTED
HAEMOPHILUS INFLUENZAE: NOT DETECTED
KLEBSIELLA OXYTOCA: NOT DETECTED
Klebsiella pneumoniae: NOT DETECTED
LISTERIA MONOCYTOGENES: NOT DETECTED
METHICILLIN RESISTANCE: DETECTED — AB
NEISSERIA MENINGITIDIS: NOT DETECTED
PSEUDOMONAS AERUGINOSA: NOT DETECTED
Proteus species: NOT DETECTED
SERRATIA MARCESCENS: NOT DETECTED
STAPHYLOCOCCUS AUREUS BCID: NOT DETECTED
STAPHYLOCOCCUS SPECIES: DETECTED — AB
STREPTOCOCCUS AGALACTIAE: NOT DETECTED
STREPTOCOCCUS PYOGENES: NOT DETECTED
STREPTOCOCCUS SPECIES: NOT DETECTED
Streptococcus pneumoniae: NOT DETECTED
Vancomycin resistance: NOT DETECTED

## 2015-11-03 LAB — GLUCOSE, CAPILLARY
Glucose-Capillary: 120 mg/dL — ABNORMAL HIGH (ref 65–99)
Glucose-Capillary: 122 mg/dL — ABNORMAL HIGH (ref 65–99)
Glucose-Capillary: 126 mg/dL — ABNORMAL HIGH (ref 65–99)
Glucose-Capillary: 128 mg/dL — ABNORMAL HIGH (ref 65–99)
Glucose-Capillary: 161 mg/dL — ABNORMAL HIGH (ref 65–99)
Glucose-Capillary: 95 mg/dL (ref 65–99)

## 2015-11-03 LAB — POCT I-STAT 3, ART BLOOD GAS (G3+)
Acid-Base Excess: 5 mmol/L — ABNORMAL HIGH (ref 0.0–2.0)
Bicarbonate: 29.8 mmol/L — ABNORMAL HIGH (ref 20.0–28.0)
O2 Saturation: 82 %
Patient temperature: 100.5
TCO2: 31 mmol/L (ref 0–100)
pCO2 arterial: 45.5 mmHg (ref 32.0–48.0)
pH, Arterial: 7.428 (ref 7.350–7.450)
pO2, Arterial: 48 mmHg — ABNORMAL LOW (ref 83.0–108.0)

## 2015-11-03 LAB — COMPREHENSIVE METABOLIC PANEL
ALT: 24 U/L (ref 17–63)
AST: 44 U/L — ABNORMAL HIGH (ref 15–41)
Albumin: 2 g/dL — ABNORMAL LOW (ref 3.5–5.0)
Alkaline Phosphatase: 79 U/L (ref 38–126)
Anion gap: 11 (ref 5–15)
BUN: 101 mg/dL — ABNORMAL HIGH (ref 6–20)
CO2: 29 mmol/L (ref 22–32)
Calcium: 7.4 mg/dL — ABNORMAL LOW (ref 8.9–10.3)
Chloride: 105 mmol/L (ref 101–111)
Creatinine, Ser: 2.28 mg/dL — ABNORMAL HIGH (ref 0.61–1.24)
GFR calc Af Amer: 30 mL/min — ABNORMAL LOW (ref >60)
GFR calc non Af Amer: 26 mL/min — ABNORMAL LOW (ref >60)
Glucose, Bld: 135 mg/dL — ABNORMAL HIGH (ref 65–99)
Potassium: 3.5 mmol/L (ref 3.5–5.1)
Sodium: 145 mmol/L (ref 135–145)
Total Bilirubin: 0.4 mg/dL (ref 0.3–1.2)
Total Protein: 5.4 g/dL — ABNORMAL LOW (ref 6.5–8.1)

## 2015-11-03 LAB — PROTIME-INR
INR: 1.35
Prothrombin Time: 16.8 seconds — ABNORMAL HIGH (ref 11.4–15.2)

## 2015-11-03 LAB — COOXEMETRY PANEL
Carboxyhemoglobin: 0.9 % (ref 0.5–1.5)
Methemoglobin: 0.9 % (ref 0.0–1.5)
O2 Saturation: 68.4 %
Total hemoglobin: 10 g/dL — ABNORMAL LOW (ref 12.0–16.0)

## 2015-11-03 LAB — APTT: APTT: 36 s (ref 24–36)

## 2015-11-03 MED ORDER — POTASSIUM CHLORIDE 10 MEQ/50ML IV SOLN
10.0000 meq | INTRAVENOUS | Status: AC | PRN
  Administered 2015-11-03 (×3): 10 meq via INTRAVENOUS

## 2015-11-03 MED ORDER — FUROSEMIDE 10 MG/ML IJ SOLN
40.0000 mg | Freq: Once | INTRAMUSCULAR | Status: AC
  Administered 2015-11-03: 40 mg via INTRAVENOUS
  Filled 2015-11-03: qty 4

## 2015-11-03 MED ORDER — VECURONIUM BROMIDE 10 MG IV SOLR: 10.0000 mg | Freq: Once | INTRAVENOUS | Status: DC

## 2015-11-03 MED ORDER — ALBUMIN HUMAN 25 % IV SOLN
12.5000 g | Freq: Four times a day (QID) | INTRAVENOUS | Status: DC
  Administered 2015-11-03: 12.5 g via INTRAVENOUS
  Filled 2015-11-03: qty 50

## 2015-11-03 MED ORDER — FENTANYL CITRATE (PF) 100 MCG/2ML IJ SOLN
200.0000 ug | Freq: Once | INTRAMUSCULAR | Status: DC
  Filled 2015-11-03: qty 4

## 2015-11-03 MED ORDER — AMIODARONE LOAD VIA INFUSION
150.0000 mg | Freq: Once | INTRAVENOUS | Status: AC
  Administered 2015-11-03: 150 mg via INTRAVENOUS

## 2015-11-03 MED ORDER — ETOMIDATE 2 MG/ML IV SOLN
40.0000 mg | Freq: Once | INTRAVENOUS | Status: DC
  Filled 2015-11-03: qty 20

## 2015-11-03 MED ORDER — DEXAMETHASONE SODIUM PHOSPHATE 4 MG/ML IJ SOLN
6.0000 mg | Freq: Four times a day (QID) | INTRAMUSCULAR | Status: AC
  Administered 2015-11-03 – 2015-11-04 (×4): 6 mg via INTRAVENOUS
  Filled 2015-11-03 (×4): qty 1.5

## 2015-11-03 MED ORDER — VANCOMYCIN HCL 10 G IV SOLR
1250.0000 mg | INTRAVENOUS | Status: DC
  Administered 2015-11-03 – 2015-11-08 (×6): 1250 mg via INTRAVENOUS
  Filled 2015-11-03 (×7): qty 1250

## 2015-11-03 MED ORDER — FENTANYL CITRATE (PF) 100 MCG/2ML IJ SOLN
INTRAMUSCULAR | Status: AC
  Filled 2015-11-03: qty 2

## 2015-11-03 MED ORDER — MIDAZOLAM HCL 2 MG/2ML IJ SOLN
4.0000 mg | Freq: Once | INTRAMUSCULAR | Status: DC
  Filled 2015-11-03: qty 4

## 2015-11-03 NOTE — Progress Notes (Signed)
PCCM PROGRESS NOTE   Admission date:  10/25/2015 Consult date:  10/27/2015 Referring provider:  Dr. Prescott Gum  CC:  Short of breath  Subjective:  Peep to 5 without desaturation fio2 60%  Vital signs: BP (!) 118/56   Pulse 81   Temp 100.2 F (37.9 C) (Oral)   Resp (!) 23   Ht 5\' 6"  (1.676 m)   Wt 239 lb 3.2 oz (108.5 kg)   SpO2 92%   BMI 38.61 kg/m    Intake/output: I/O last 3 completed shifts: In: 4201.8 [I.V.:2349.4; NG/GT:1552.4; IV Piggyback:300] Out: [id number] [Urine:4600; Chest Tube:180]  Hemodynamics:    Vent settings: Vent Mode: CPAP;PSV FiO2 (%):  [50 %-60 %] 60 % Set Rate:  [16 bmp] 16 bmp PEEP:  [5 cmH20] 5 cmH20 Pressure Support:  [5 cmH20] 5 cmH20 Plateau Pressure:  [16 cmH20-18 cmH20] 17 cmH20   General: ill appearing Neuro: RASS -2 HEENT: ETT , obese neck Cardiac: s1 s2 IRR Chest: coarse distant, less  Abd: soft, present BS, no r/g Ext: 1+ edema unchanged, exts cool to touch.  Skin: no rashes  LABS: CMP Latest Ref Rng & Units 11/03/2015 11/02/2015 11/02/2015  Glucose 65 - 99 mg/dL 135(H) 111(H) 120(H)  BUN 6 - 20 mg/dL 101(H) 88(H) 81(H)  Creatinine 0.61 - 1.24 mg/dL 2.28(H) 2.10(H) 2.28(H)  Sodium 135 - 145 mmol/L 145 143 140  Potassium 3.5 - 5.1 mmol/L 3.5 3.8 3.6  Chloride 101 - 111 mmol/L 105 101 102  CO2 22 - 32 mmol/L 29 - 29  Calcium 8.9 - 10.3 mg/dL 7.4(L) - 7.2(L)  Total Protein 6.5 - 8.1 g/dL 5.4(L) - 5.1(L)  Total Bilirubin 0.3 - 1.2 mg/dL 0.4 - 0.7  Alkaline Phos 38 - 126 U/L 79 - 83  AST 15 - 41 U/L 44(H) - 52(H)  ALT 17 - 63 U/L 24 - 25    CBC Latest Ref Rng & Units 11/03/2015 11/02/2015 11/02/2015  WBC 4.0 - 10.5 K/uL 13.8(H) - 13.5(H)  Hemoglobin 13.0 - 17.0 g/dL 8.4(L) 8.5(L) 8.4(L)  Hematocrit 39.0 - 52.0 % 26.3(L) 25.0(L) 25.8(L)  Platelets 150 - 400 K/uL 278 - 205    ABG    Component Value Date/Time   PHART 7.381 11/03/2015 0325   PCO2ART 51.9 (H) 11/03/2015 0325   PO2ART 56.5 (L) 11/03/2015 0325   HCO3 29.8  (H) 11/03/2015 0325   TCO2 31 11/02/2015 1601   ACIDBASEDEF 1.0 10/29/2015 1531   O2SAT 68.4 11/03/2015 0410    CBG (last 3)   Recent Labs  11/02/15 1949 11/02/15 2355 11/03/15 0358  GLUCAP 142* 140* 128*    Imaging: Dg Chest Port 1 View  Result Date: 11/02/2015 CLINICAL DATA:  Endotracheal tube present EXAM: PORTABLE CHEST 1 VIEW COMPARISON:  11/01/2015 FINDINGS: Endotracheal tube 2.5 cm from carina NG tube unchanged. Bilateral chest tubes unchanged. Bibasilar atelectasis and small effusions unchanged. No pneumothorax. IMPRESSION: 1. Stable support apparatus. 2. No significant change with bibasilar atelectasis and effusions. 3. Chest tubes in place without pneumothorax . Electronically Signed   By: Suzy Bouchard M.D.   On: 11/02/2015 08:59   Dg Chest Port 1 View  Result Date: 11/01/2015 CLINICAL DATA:  Intubated. EXAM: PORTABLE CHEST 1 VIEW COMPARISON:  11/01/2015 FINDINGS: Endotracheal tube is 3 cm above the carina. Bilateral chest tubes in place. No pneumothorax. Right PICC line and NG tube are unchanged. There is cardiomegaly with vascular congestion. Bilateral lower lobe airspace opacities and layering effusions. Slight improvement in aeration since prior study. IMPRESSION:  Layering bilateral effusions with bilateral lower lobe atelectasis or infiltrates, slightly improved since prior study. Support devices stable. Electronically Signed   By: Rolm Baptise M.D.   On: 11/01/2015 12:09    Studies:   Cultures: 10/18 sputum>>NF 10/19 sputum>>>NF 10/18 bcx 2>>  Antibiotics: 10/17 levaquin >>>10/24 10/17 Vancomycin >> 10/18 10/24 aztreonam>>>  Events: 10/17 Admit, cardiac cath, emergent CABG due to NSTEMI. 10/18 0600 PCCM consulted for refractory hypoxia  Lines/tubes: 10/17 ETT>> 10/17 CT's x 3>>removed  x1 10/25>>> 10/17 Lt rad a line>>10/23 10/17 Rt IJ PA cath>>out  Summary: 81 yo male presented with increasing dyspnea and a fib with RVR.  Found to have 99% Lt  main CAD and had emergent CABG 10/17.  Developed refractory hypoxia post-op and PCCM consulted.  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  Assessment/plan:  Acute hypoxic respiratory failure from HCAP and pulmonary edema. Hx of COPD. PFO- shunt? likely a factor - Pulmicort, duoneb -peep remains 5, 60% -even balance goals -peep successful to 5, wean cpap 5 ps 5 60%, assess abg , rsbi at 1 hour, WUA  NSTEMI s/p CABG. Cardiogenic shock. AFib RVR on amio. -lasix held per cvts - dopmaine per cvts, consider dc - continue ASA, lipitor, amio  AKI. Chronic kidney disease 2 >> baseline creatine 1.20. - appears with edema still on pcxr, rt base effusion -lasix held, BUN noted  Nutrition. Hx of GERD - Tube feeds, increase T V to goal - Protonix for SUP -BM needed, supp  Anemia after surgery and critical illness.  - F/u CBC with neg balance -repeat coags for possible trach this week  DM type II controlled - SSI with levemir, may need redcution  Acute metabolic encephalopathy. - RASS goal: -1 , prop with WUA -fent wua fully needed  ID Presumed hcap, some fevers, improved 10/25 F/up cultures  Cont aztreonam If worsen, change to imipenem and pre treat   Mark Rivers, PGY3.   STAFF NOTE: I, Merrie Roof, MD FACP have personally reviewed patient's available data, including medical history, events of note, physical examination and test results as part of my evaluation. I have discussed with resident/NP and other care providers such as pharmacist, RN and RRT. In addition, I personally evaluated patient and elicited key findings of: awakens, FC, reduced BS rt base, no wheezing, pcxr with effusion rt base, BUN elevated,. Successful to peep 5, wean CPAP 5 PS 5 60% upright WUA, reduce fent significantly, may need to [place] rt base effusion? Not draining from CT, likley has PFO that is physiological significant as we were  able to lower PEEP to 5, no fevers after aztreonam added, if spike change to imipenem given nosocomial exposure and pre treat given PCN allergy concerns, likely can increase TF to goal, abdo no changes, agree hold lasix and follow trend renal fxn , coags okay if needs trach, although now on min support favoring chance at extubation The patient is critically ill with multiple organ systems failure and requires high complexity decision making for assessment and support, frequent evaluation and titration of therapies, application of advanced monitoring technologies and extensive interpretation of multiple databases.   Critical Care Time devoted to patient care services described in this note is 35 Minutes. This time reflects time of care of this signee: Merrie Roof, MD FACP. This critical care time does not reflect procedure time, or teaching time or supervisory time of PA/NP/Med student/Med Resident etc but could involve  care discussion time. Rest per NP/medical resident whose note is outlined above and that I agree with   Lavon Paganini. Titus Mould, MD, [place] Pgr: [facility] 11/03/2015 9:49 AM \

## 2015-11-03 NOTE — Progress Notes (Signed)
PHARMACY - PHYSICIAN COMMUNICATION CRITICAL VALUE ALERT - BLOOD CULTURE IDENTIFICATION (BCID)  Results for orders placed or performed during the hospital encounter of 10/25/15  Blood Culture ID Panel (Reflexed) (Collected: 11/02/2015 12:35 PM)  Result Value Ref Range   Enterococcus species NOT DETECTED NOT DETECTED   Vancomycin resistance NOT DETECTED NOT DETECTED   Listeria monocytogenes NOT DETECTED NOT DETECTED   Staphylococcus species DETECTED (A) NOT DETECTED   Staphylococcus aureus NOT DETECTED NOT DETECTED   Methicillin resistance DETECTED (A) NOT DETECTED   Streptococcus species NOT DETECTED NOT DETECTED   Streptococcus agalactiae NOT DETECTED NOT DETECTED   Streptococcus pneumoniae NOT DETECTED NOT DETECTED   Streptococcus pyogenes NOT DETECTED NOT DETECTED   Acinetobacter baumannii NOT DETECTED NOT DETECTED   Enterobacteriaceae species NOT DETECTED NOT DETECTED   Enterobacter cloacae complex NOT DETECTED NOT DETECTED   Escherichia coli NOT DETECTED NOT DETECTED   Klebsiella oxytoca NOT DETECTED NOT DETECTED   Klebsiella pneumoniae NOT DETECTED NOT DETECTED   Proteus species NOT DETECTED NOT DETECTED   Serratia marcescens NOT DETECTED NOT DETECTED   Carbapenem resistance NOT DETECTED NOT DETECTED   Haemophilus influenzae NOT DETECTED NOT DETECTED   Neisseria meningitidis NOT DETECTED NOT DETECTED   Pseudomonas aeruginosa NOT DETECTED NOT DETECTED   Candida albicans NOT DETECTED NOT DETECTED   Candida glabrata NOT DETECTED NOT DETECTED   Candida krusei NOT DETECTED NOT DETECTED   Candida parapsilosis NOT DETECTED NOT DETECTED   Candida tropicalis NOT DETECTED NOT DETECTED    Name of physician (or Provider) Contacted: Dr. Ashby Dawes (CCM)  Changes to prescribed antibiotics required: Add vanco, continue Aztreonam   [name] 11/03/2015  11:14 PM

## 2015-11-03 NOTE — Progress Notes (Signed)
Pt extubated per MD order. MD and NP at the bedside due to pt having difficult airway. Pt tol well, no distress or stridor noted at the time of extubation. Placed pt on 100% NRB mask. Sats 98%, HR 92, RR 22. Pt sounds congested and raspy with voice. Will cont to monitor

## 2015-11-03 NOTE — Progress Notes (Signed)
MD made aware of ABG results; no changes made at this time; will cont. To monitor.  Ruben Reason

## 2015-11-03 NOTE — Care Management Note (Signed)
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  Pt's 2 daughters and brother are staying with her while he is hospitalized and needed information about options for her now and after his discharge.  CM provided information re skilled services covered by insurance vs custodial services that will be private pay.  Family has list of private duty agencies.                            Expected Discharge Plan:  Skilled Nursing Facility  In-House Referral:  Clinical Social Work  Discharge planning Services  CM Consult  Status of Service:  In process, will continue to follow  Girard Cooter, RN 11/03/2015, 3:01 PM

## 2015-11-03 NOTE — Progress Notes (Signed)
Pt work of breathing increased; RR up to 30-40's; sats 90-92% NRB; MD paged to make aware of changes; MD to see pt; RT at bedside for ABG; will cont. To monitor.  Mark Rivers

## 2015-11-03 NOTE — Progress Notes (Signed)
8 Days Post-Op Procedure(s) (LRB): CORONARY ARTERY BYPASS GRAFTING (CABG) x3(LIMA to LA, SVG to OM1, SVG to PDA) with EVH from the left thigh and partial lower leg greater saphenous vein and left internal mammary artery (N/A) TRANSESOPHAGEAL ECHOCARDIOGRAM (TEE) (N/A) INTRA-AORTIC BALLOON PUMP INSERTION size 40cc (Left) Subjective: Status post emergency CABG for critical left main stenosis Postop acute on chronic respiratory insufficiency, history of COPD Attempted extubation today unsuccessful, tracheostomy plan for tomorrow Postoperative echo shows normal LV function Patient with postoperative intermittent atrial fibrillation on IV amiodarone Sputum cultures remain negative Postoperative acute renal insufficiency, creatinine 2.3  Objective: Vital signs in last 24 hours: Temp:  [99.3 F (37.4 C)-100.5 F (38.1 C)] 99.5 F (37.5 C) (10/25 1329) Pulse Rate:  [36-125] 36 (10/25 1830) Cardiac Rhythm: Atrial fibrillation (10/25 1800) Resp:  [0-29] 17 (10/25 1830) BP: (95-145)/(43-103) 104/54 (10/25 1830) SpO2:  [90 %-100 %] 90 % (10/25 1830) FiO2 (%):  [50 %-100 %] 80 % (10/25 1800) Weight:  [239 lb 3.2 oz (108.5 kg)] 239 lb 3.2 oz (108.5 kg) (10/25 0400)  Hemodynamic parameters for last 24 hours:  sinus with atrial fib  Intake/Output from previous day: 10/24 0701 - 10/25 0700 In: 3061.8 [I.V.:1771.8; NG/GT:990; IV Piggyback:300] Out: 2985 [Urine:2865; Chest Tube:120] Intake/Output this shift: Total I/O In: 1540.5 [I.V.:1080.5; NG/GT:210; IV Piggyback:250] Out: 2230 [Urine:2050; Emesis/NG output:50; Chest Tube:130]  Sedated on ventilator Lungs clear Moderate edema Sternal incision clean, dry Lab Results:  Recent Labs  11/02/15 0400  11/03/15 0356 11/03/15 1515  WBC 13.5*  --  13.8*  --   HGB 8.4*  < > 8.4* 8.2*  HCT 25.8*  < > 26.3* 24.0*  PLT 205  --  278  --   < > = values in this interval not displayed. BMET:  Recent Labs  11/02/15 0400  11/03/15 0356  11/03/15 1515  NA 140  < > 145 138  K 3.6  < > 3.5 3.0*  CL 102  < > 105 98*  CO2 29  --  29  --   GLUCOSE 120*  < > 135* 196*  BUN 81*  < > 101* 97*  CREATININE 2.28*  < > 2.28* 2.00*  CALCIUM 7.2*  --  7.4*  --   < > = values in this interval not displayed.  PT/INR:  Recent Labs  11/03/15 1500  LABPROT 16.8*  INR 1.35   ABG    Component Value Date/Time   PHART 7.447 11/03/2015 1410   HCO3 29.9 (H) 11/03/2015 1410   TCO2 28 11/03/2015 1515   ACIDBASEDEF 1.0 10/29/2015 1531   O2SAT 87.3 11/03/2015 1410   CBG (last 3)   Recent Labs  11/03/15 0854 11/03/15 1327 11/03/15 1641  GLUCAP 122* 95 120*    Assessment/Plan: S/P Procedure(s) (LRB): CORONARY ARTERY BYPASS GRAFTING (CABG) x3(LIMA to LA, SVG to OM1, SVG to PDA) with EVH from the left thigh and partial lower leg greater saphenous vein and left internal mammary artery (N/A) TRANSESOPHAGEAL ECHOCARDIOGRAM (TEE) (N/A) INTRA-AORTIC BALLOON PUMP INSERTION size 40cc (Left) Continue current care, hold tube feeds Percutaneous tracheostomy by CCM tomorrow   LOS: 9 days    Tharon Aquas Trigt III 11/03/2015

## 2015-11-03 NOTE — Procedures (Signed)
Vent changed due to low volumes being delivered to pt.

## 2015-11-03 NOTE — Procedures (Signed)
Intubation Procedure Note Mark Rivers [id number] Mar 20, 1934  Procedure: Intubation Indications: vocal cord edema stridor  Procedure Details Consent: Risks of procedure as well as the alternatives and risks of each were explained to the (patient/caregiver).  Consent for procedure obtained. and Unable to obtain consent because of emergent medical necessity. Time Out: Verified patient identification, verified procedure, site/side was marked, verified correct patient position, special equipment/implants available, medications/allergies/relevent history reviewed, required imaging and test results available.  Performed  Maximum sterile technique was used including gloves, gown and hand hygiene.  MAC and 3    Evaluation Hemodynamic Status: BP stable throughout; O2 sats: stable throughout Patient's Current Condition: stable Complications: No apparent complications Patient did tolerate procedure well. Chest X-ray ordered to verify placement.  CXR: pending.   Mark Rivers. 11/03/2015   Eventually developed stridor Cords were edema moderate and lower looked jagged 7.5 placed  Mark Rivers. Titus Mould, MD, [place] Pgr: [facility]

## 2015-11-03 NOTE — Progress Notes (Signed)
L PT chest tube removed at this time per MD order; pt tolerated well; will cont. To monitor.  Ruben Reason

## 2015-11-03 NOTE — Progress Notes (Signed)
Pt in A-fib HR 120-130's; Dr. Prescott Gum paged to make aware; new orders for Amio bolus; will cont. To monitor.   Mark Rivers

## 2015-11-03 NOTE — Progress Notes (Addendum)
Pharmacy Antibiotic Note  Mark Rivers is a 81 y.o. male admitted on 10/25/2015 with pneumonia.  Pharmacy has been consulted for Vancomycin dosing. Pt has been on Aztreonam monotherapy for presumed HCAP. Pt now has BCID positive for staph species.  Pt dose have low grade fever up to 100.5. WBC has been holding at ~13.5 for the last several days. Noted renal dysfunction with normalized CrCl ~30 mL/min.   Plan: -Vancomycin 1250 mg IV q24h -Continue Aztreonam as ordered -Trend WBC, temp, renal function  -Drug levels as indicated  -F/U blood cultures for definitive therapy  Height: 5\' 6"  (167.6 cm) Weight: 239 lb 3.2 oz (108.5 kg) IBW/kg (Calculated) : 63.8  Temp (24hrs), Avg:99.8 F (37.7 C), Min:99 F (37.2 C), Max:100.5 F (38.1 C)   Recent Labs Lab 10/28/15 0300  10/31/15 0330 11/01/15 0350 11/01/15 1230 11/01/15 1818 11/02/15 0400 11/02/15 1601 11/03/15 0356 11/03/15 1515  WBC 23.2*  < > 14.5* 13.6* 13.5*  --  13.5*  --  13.8*  --   CREATININE 1.96*  < > 2.12* 2.22*  --  2.30* 2.28* 2.10* 2.28* 2.00*  LATICACIDVEN 1.1  --   --   --   --   --   --   --   --   --   < > = values in this interval not displayed.  Estimated Creatinine Clearance: 34.6 mL/min (by C-G formula based on SCr of 2 mg/dL (H)).    Allergies  Allergen Reactions  . Amoxicillin Other (See Comments)    PATIENT PASSES OUT!!!!  . Cephalexin Other (See Comments)    PATIENT PASSES OUT!!!!  . Penicillin G Other (See Comments)    Has patient had a PCN reaction causing immediate rash, facial/tongue/throat swelling, SOB or lightheadedness with hypotension: Yes Has patient had a PCN reaction causing severe rash involving mucus membranes or skin necrosis: No Has patient had a PCN reaction that required hospitalization: Yes Has patient had a PCN reaction occurring within the last 10 years: Yes If all of the above answers are "NO", then may proceed with Cephalosporin use. PATIENT PASSES OUT!!!!    Mark Rivers 11/03/2015 10:58 PM

## 2015-11-04 ENCOUNTER — Inpatient Hospital Stay (HOSPITAL_COMMUNITY): Payer: Medicare Other

## 2015-11-04 LAB — BLOOD GAS, ARTERIAL
Acid-Base Excess: 5.7 mmol/L — ABNORMAL HIGH (ref 0.0–2.0)
Bicarbonate: 30.7 mmol/L — ABNORMAL HIGH (ref 20.0–28.0)
Drawn by: 42624
FIO2: 0.8
O2 Saturation: 91.3 %
PEEP: 5 cmH2O
Patient temperature: 98.6
Pressure control: 14 cmH2O
RATE: 16 resp/min
pCO2 arterial: 52.9 mmHg — ABNORMAL HIGH (ref 32.0–48.0)
pH, Arterial: 7.381 (ref 7.350–7.450)
pO2, Arterial: 65.5 mmHg — ABNORMAL LOW (ref 83.0–108.0)

## 2015-11-04 LAB — COMPREHENSIVE METABOLIC PANEL
ALT: 24 U/L (ref 17–63)
AST: 42 U/L — ABNORMAL HIGH (ref 15–41)
Albumin: 2.1 g/dL — ABNORMAL LOW (ref 3.5–5.0)
Alkaline Phosphatase: 64 U/L (ref 38–126)
Anion gap: 11 (ref 5–15)
BUN: 99 mg/dL — ABNORMAL HIGH (ref 6–20)
CO2: 26 mmol/L (ref 22–32)
Calcium: 7 mg/dL — ABNORMAL LOW (ref 8.9–10.3)
Chloride: 103 mmol/L (ref 101–111)
Creatinine, Ser: 1.82 mg/dL — ABNORMAL HIGH (ref 0.61–1.24)
GFR calc Af Amer: 39 mL/min — ABNORMAL LOW (ref >60)
GFR calc non Af Amer: 34 mL/min — ABNORMAL LOW (ref >60)
Glucose, Bld: 268 mg/dL — ABNORMAL HIGH (ref 65–99)
Potassium: 3.3 mmol/L — ABNORMAL LOW (ref 3.5–5.1)
Sodium: 140 mmol/L (ref 135–145)
Total Bilirubin: 0.9 mg/dL (ref 0.3–1.2)
Total Protein: 5.2 g/dL — ABNORMAL LOW (ref 6.5–8.1)

## 2015-11-04 LAB — POCT I-STAT, CHEM 8
BUN: 110 mg/dL — ABNORMAL HIGH (ref 6–20)
Calcium, Ion: 1.07 mmol/L — ABNORMAL LOW (ref 1.15–1.40)
Chloride: 108 mmol/L (ref 101–111)
Creatinine, Ser: 2 mg/dL — ABNORMAL HIGH (ref 0.61–1.24)
Glucose, Bld: 180 mg/dL — ABNORMAL HIGH (ref 65–99)
HCT: 27 % — ABNORMAL LOW (ref 39.0–52.0)
Hemoglobin: 9.2 g/dL — ABNORMAL LOW (ref 13.0–17.0)
Potassium: 3.3 mmol/L — ABNORMAL LOW (ref 3.5–5.1)
Sodium: 150 mmol/L — ABNORMAL HIGH (ref 135–145)
TCO2: 29 mmol/L (ref 0–100)

## 2015-11-04 LAB — GLUCOSE, CAPILLARY
Glucose-Capillary: 144 mg/dL — ABNORMAL HIGH (ref 65–99)
Glucose-Capillary: 156 mg/dL — ABNORMAL HIGH (ref 65–99)
Glucose-Capillary: 156 mg/dL — ABNORMAL HIGH (ref 65–99)
Glucose-Capillary: 178 mg/dL — ABNORMAL HIGH (ref 65–99)
Glucose-Capillary: 194 mg/dL — ABNORMAL HIGH (ref 65–99)
Glucose-Capillary: 252 mg/dL — ABNORMAL HIGH (ref 65–99)

## 2015-11-04 LAB — CULTURE, RESPIRATORY

## 2015-11-04 LAB — CULTURE, RESPIRATORY W GRAM STAIN: Culture: NORMAL

## 2015-11-04 LAB — CBC
HCT: 25.6 % — ABNORMAL LOW (ref 39.0–52.0)
Hemoglobin: 8.7 g/dL — ABNORMAL LOW (ref 13.0–17.0)
MCH: 32.2 pg (ref 26.0–34.0)
MCHC: 34 g/dL (ref 30.0–36.0)
MCV: 94.8 fL (ref 78.0–100.0)
Platelets: 327 10*3/uL (ref 150–400)
RBC: 2.7 MIL/uL — ABNORMAL LOW (ref 4.22–5.81)
RDW: 16.5 % — ABNORMAL HIGH (ref 11.5–15.5)
WBC: 13.1 10*3/uL — ABNORMAL HIGH (ref 4.0–10.5)

## 2015-11-04 MED ORDER — HYDROCORTISONE 1 % EX OINT
TOPICAL_OINTMENT | Freq: Two times a day (BID) | CUTANEOUS | Status: DC
  Administered 2015-11-04 – 2015-11-09 (×10): via TOPICAL
  Administered 2015-11-10: 1 via TOPICAL
  Administered 2015-11-10: 10:00:00 via TOPICAL
  Administered 2015-11-11: 1 via TOPICAL
  Administered 2015-11-11 – 2015-11-15 (×8): via TOPICAL
  Administered 2015-11-16: 1 via TOPICAL
  Administered 2015-11-17: 10:00:00 via TOPICAL
  Administered 2015-11-19: 1 via TOPICAL
  Administered 2015-11-19 – 2015-11-21 (×4): via TOPICAL
  Filled 2015-11-04 (×2): qty 28.35

## 2015-11-04 MED ORDER — FENTANYL CITRATE (PF) 100 MCG/2ML IJ SOLN
25.0000 ug | INTRAMUSCULAR | Status: AC | PRN
Start: 2015-11-04 — End: 2015-11-05

## 2015-11-04 MED ORDER — POTASSIUM CHLORIDE 10 MEQ/50ML IV SOLN
10.0000 meq | INTRAVENOUS | Status: AC
  Administered 2015-11-04 (×2): 10 meq via INTRAVENOUS

## 2015-11-04 MED ORDER — FUROSEMIDE 10 MG/ML IJ SOLN
40.0000 mg | Freq: Every day | INTRAMUSCULAR | Status: DC
  Administered 2015-11-04 – 2015-11-07 (×4): 40 mg via INTRAVENOUS
  Filled 2015-11-04 (×4): qty 4

## 2015-11-04 MED ORDER — ROCURONIUM BROMIDE 50 MG/5ML IV SOLN
1.0000 mg/kg | Freq: Once | INTRAVENOUS | Status: AC
  Administered 2015-11-04: 40 mg via INTRAVENOUS

## 2015-11-04 MED ORDER — CHLORHEXIDINE GLUCONATE 0.12% ORAL RINSE (MEDLINE KIT)
15.0000 mL | Freq: Two times a day (BID) | OROMUCOSAL | Status: DC
  Administered 2015-11-04 – 2015-11-25 (×38): 15 mL via OROMUCOSAL

## 2015-11-04 MED ORDER — POTASSIUM CHLORIDE 10 MEQ/50ML IV SOLN
10.0000 meq | INTRAVENOUS | Status: AC
  Administered 2015-11-04 (×3): 10 meq via INTRAVENOUS
  Filled 2015-11-04: qty 50

## 2015-11-04 MED ORDER — ORAL CARE MOUTH RINSE
15.0000 mL | OROMUCOSAL | Status: DC
Start: 2015-11-04 — End: 2015-11-17
  Administered 2015-11-04 – 2015-11-17 (×131): 15 mL via OROMUCOSAL

## 2015-11-04 MED ORDER — BACITRACIN-NEOMYCIN-POLYMYXIN OINTMENT TUBE
TOPICAL_OINTMENT | Freq: Two times a day (BID) | CUTANEOUS | Status: DC
  Administered 2015-11-04 – 2015-11-08 (×8): via TOPICAL
  Administered 2015-11-09: 1 via TOPICAL
  Administered 2015-11-09: 22:00:00 via TOPICAL
  Administered 2015-11-10: 1 via TOPICAL
  Administered 2015-11-10 – 2015-11-11 (×2): via TOPICAL
  Administered 2015-11-11: 1 via TOPICAL
  Administered 2015-11-12 – 2015-11-15 (×7): via TOPICAL
  Administered 2015-11-16: 1 via TOPICAL
  Administered 2015-11-17 – 2015-11-19 (×3): via TOPICAL
  Administered 2015-11-19: 1 via TOPICAL
  Administered 2015-11-20 – 2015-11-21 (×3): via TOPICAL
  Filled 2015-11-04 (×2): qty 1

## 2015-11-04 NOTE — Procedures (Signed)
Name:  Mark Rivers MRN:  [id number] DOB:  1934-03-16  OPERATIVE NOTE  Procedure:  Percutaneous tracheostomy.  Indications:  Ventilator-dependent respiratory failure.  Consent:  Procedure, alternatives, risks and benefits discussed with medical POA.  Questions answered.  Consent obtained.  Anesthesia:  fent prop, vec.  Procedure summary:  Appropriate equipment was assembled.  The patient was identified as [employer] and safety timeout was performed. The patient was placed in supine position with a towel roll behind shoulder blades and neck extended.  Sterile technique was used. The patient's neck and upper chest were prepped using chlorhexidine / alcohol scrub and the field was draped in usual sterile fashion with full body drape. After the adequate sedation / anesthesia was achieved, attention was directed at the midline trachea, where the cricothyroid membrane was palpated. Approximately one  fingerbreadths above the sternal notch, a verticle incision was created with a scalpel after local infiltration with 0.2% Lidocaine. Then, using Seldinger technique and a percutaneous tracheostomy set, the trachea was entered with a 14 gauge needle with an overlying sheath aprox in 1-2 inter tracheal space below crichoid. This was all confirmed under direct visualization of a fiberoptic flexible bronchoscope. Entrance into the trachea was identified through the third tracheal ring interspace. Following this, a guidewire was inserted. The needle was removed, leaving the sheath and the guidewire intact. Next, the sheath was removed and a small dilator was inserted. The tracheal rings were then dilated. A #6 Shiley was then opened. The balloon was checked. It was placed over a tracheal dilator, which was then advanced over the guidewire and through the previously dilated tract. The Shiley tracheostomy tube was noted to pass in the trachea with little resistance. The guidewire and dilator tubes were removed  from the trachea. An inner cannula was placed through the tracheostomy tube. The tracheostomy was then secured at the anterior neck with 4 monofilament sutures. The oral endotracheal tube was removed and the ventilator was attached to the newly placed tracheostomy tube. Adequate tidal volumes were noted. The cuff was inflated and no evidence of air leak was noted. No evidence of bleeding was noted. At this point, the procedure was concluded. Post-procedure chest x-ray was ordered.  Complications:  No immediate complications were noted.  Hemodynamic parameters and oxygenation remained stable throughout the procedure.  Estimated blood loss:  Less then 15 mL.  [name]., MD [facility] [place] Pager: [phone]  11/04/2015, 9:26 AM   Should follow up in [place] clinic [phone] Or can see [facility] for trach care as outpt , either way

## 2015-11-04 NOTE — Significant Event (Signed)
RN called patient's daughter Rod Holler to let her know of chest tube placement tomorrow. Daughter is aware and gave verbal consent to procedure-however, she wants her sister Sharee Pimple to sign the written consent when [place] visits tonight. Consent in chart for when that does happen-nightshift RN made aware.

## 2015-11-04 NOTE — Procedures (Signed)
Bronchoscopy Procedure Note Mark Rivers [id number] October 17, 1934  Procedure: Bronchoscopy Indications: Diagnostic evaluation of the airways  Procedure Details Consent: Risks of procedure as well as the alternatives and risks of each were explained to the (patient/caregiver).  Consent for procedure obtained. Time Out: Verified patient identification, verified procedure, site/side was marked, verified correct patient position, special equipment/implants available, medications/allergies/relevent history reviewed, required imaging and test results available.  Performed  In preparation for procedure, patient was given 100% FiO2 and bronchoscope lubricated. Sedation: Benzodiazepines, Muscle relaxants and Etomidate  Airway entered and the following bronchi were examined: RUL, RML, RLL, LUL, LLL and Bronchi.   Bronchoscope removed.    Evaluation Hemodynamic Status: BP stable throughout; O2 sats: stable throughout Patient's Current Condition: stable Specimens:  None Complications: No apparent complications Patient did tolerate procedure well.   Mark Rivers 11/04/2015

## 2015-11-04 NOTE — Procedures (Signed)
Bedside Tracheostomy Insertion Procedure Note   Patient Details:   Name: Mark Rivers DOB: 23-Apr-1934 MRN: [id number]  Procedure: Tracheostomy  Pre Procedure Assessment: ET Tube Size:7.5 ET Tube secured at lip (cm): 24 Bite block in place: Yes Breath Sounds: Clear  Post Procedure Assessment: BP (!) 119/57   Pulse 63   Temp (!) 96.2 F (35.7 C) (Axillary)   Resp (!) 21   Ht 5\' 6"  (1.676 m)   Wt 231 lb 11.3 oz (105.1 kg)   SpO2 100%   BMI 37.40 kg/m  O2 sats: stable throughout Complications: No apparent complications Patient did tolerate procedure well Tracheostomy Brand:Shiley Tracheostomy Style:Cuffed Tracheostomy Size: 6.0 Tracheostomy Secured [id number] Tracheostomy Placement Confirmation:Trach cuff visualized and in place    Phillis Knack [facility] 11/04/2015, 10:43 AM

## 2015-11-04 NOTE — Progress Notes (Signed)
9 Days Post-Op Procedure(s) (LRB): CORONARY ARTERY BYPASS GRAFTING (CABG) x3(LIMA to LA, SVG to OM1, SVG to PDA) with EVH from the left thigh and partial lower leg greater saphenous vein and left internal mammary artery (N/A) TRANSESOPHAGEAL ECHOCARDIOGRAM (TEE) (N/A) INTRA-AORTIC BALLOON PUMP INSERTION size 40cc (Left) Subjective: Successful tracheostomy today for persistent ventilator dependence after CABG Right pleural effusion, moderate. Plan right chest tube placement tomorrow Resume tube feeds physical therapy and mobilize  BUN creatinine trending down Objective: Vital signs in last 24 hours: Temp:  [96.2 F (35.7 C)-99 F (37.2 C)] 97.4 F (36.3 C) (10/26 1616) Pulse Rate:  [59-88] 70 (10/26 1815) Cardiac Rhythm: Atrial fibrillation (10/26 0400) Resp:  [7-27] 27 (10/26 1815) BP: (96-147)/(53-83) 147/62 (10/26 1815) SpO2:  [81 %-100 %] 97 % (10/26 1815) FiO2 (%):  [70 %-100 %] 70 % (10/26 1815) Weight:  [231 lb 11.3 oz (105.1 kg)] 231 lb 11.3 oz (105.1 kg) (10/26 0500)  Hemodynamic parameters for last 24 hours:  sinus rhythm  Intake/Output from previous day: 10/25 0701 - 10/26 0700 In: 2921.4 [I.V.:1981.4; NG/GT:390; IV Piggyback:550] Out: [id number] [Urine:3600; Emesis/NG output:200; Chest Tube:180] Intake/Output this shift: Total I/O In: 1291.7 [I.V.:675.3; NG/GT:416.3; IV Piggyback:200] Out: 1275 [Urine:1275]  Edematous Tracheostomy without bleeding Sedated on vent  Lab Results:  Recent Labs  11/03/15 0356  11/04/15 0400 11/04/15 1730  WBC 13.8*  --  13.1*  --   HGB 8.4*  < > 8.7* 9.2*  HCT 26.3*  < > 25.6* 27.0*  PLT 278  --  327  --   < > = values in this interval not displayed. BMET:  Recent Labs  11/03/15 0356  11/04/15 0400 11/04/15 1730  NA 145  < > 140 150*  K 3.5  < > 3.3* 3.3*  CL 105  < > 103 108  CO2 29  --  26  --   GLUCOSE 135*  < > 268* 180*  BUN 101*  < > 99* 110*  CREATININE 2.28*  < > 1.82* 2.00*  CALCIUM 7.4*  --  7.0*  --   < > =  values in this interval not displayed.  PT/INR:  Recent Labs  11/03/15 1500  LABPROT 16.8*  INR 1.35   ABG    Component Value Date/Time   PHART 7.381 11/04/2015 0330   HCO3 30.7 (H) 11/04/2015 0330   TCO2 29 11/04/2015 1730   ACIDBASEDEF 1.0 10/29/2015 1531   O2SAT 91.3 11/04/2015 0330   CBG (last 3)   Recent Labs  11/04/15 0728 11/04/15 1210 11/04/15 1613  GLUCAP 144* 156* 156*    Assessment/Plan: S/P Procedure(s) (LRB): CORONARY ARTERY BYPASS GRAFTING (CABG) x3(LIMA to LA, SVG to OM1, SVG to PDA) with EVH from the left thigh and partial lower leg greater saphenous vein and left internal mammary artery (N/A) TRANSESOPHAGEAL ECHOCARDIOGRAM (TEE) (N/A) INTRA-AORTIC BALLOON PUMP INSERTION size 40cc (Left) Continue cautious diuresis follow creatinine Plan chest tube for moderate right pleural effusion in a.m.   LOS: 10 days    Mark Rivers 11/04/2015

## 2015-11-04 NOTE — Progress Notes (Signed)
PCCM PROGRESS NOTE   Admission date:  10/25/2015 Consult date:  10/27/2015 Referring provider:  Dr. Prescott Gum  CC:  Short of breath  Subjective:  Extubated, reintubarted, upper airway edema present with stridor Peep to 5 fio2 80%. satting fine.   Vital signs: BP 122/61   Pulse 62   Temp (!) 96.2 F (35.7 C) (Axillary)   Resp 16   Ht 5\' 6"  (1.676 m)   Wt 231 lb 11.3 oz (105.1 kg)   SpO2 98%   BMI 37.40 kg/m    Intake/output: I/O last 3 completed shifts: In: 4245.7 [I.V.:2805.7; NG/GT:790; IV Piggyback:650] Out: [id number] [Urine:4760; Emesis/NG output:200; Chest Tube:280]  Hemodynamics:    Vent settings: Vent Mode: PCV FiO2 (%):  [60 %-100 %] 80 % Set Rate:  [16 bmp] 16 bmp PEEP:  [5 cmH20] 5 cmH20 Pressure Support:  [5 cmH20] 5 cmH20 Plateau Pressure:  [12 cmH20-14 cmH20] 14 cmH20   General: ill appearing Neuro: RASS -1 HEENT: ETT , obese neck Cardiac: s1 s2 IRR Chest: coarse BS.   Abd: soft, present BS, no r/g Ext: 1+ edema unchanged, exts warmer to touch. Skin: no rashes  LABS: CMP Latest Ref Rng & Units 11/04/2015 11/03/2015 11/03/2015  Glucose 65 - 99 mg/dL 268(H) 196(H) 135(H)  BUN 6 - 20 mg/dL 99(H) 97(H) 101(H)  Creatinine 0.61 - 1.24 mg/dL 1.82(H) 2.00(H) 2.28(H)  Sodium 135 - 145 mmol/L 140 138 145  Potassium 3.5 - 5.1 mmol/L 3.3(L) 3.0(L) 3.5  Chloride 101 - 111 mmol/L 103 98(L) 105  CO2 22 - 32 mmol/L 26 - 29  Calcium 8.9 - 10.3 mg/dL 7.0(L) - 7.4(L)  Total Protein 6.5 - 8.1 g/dL 5.2(L) - 5.4(L)  Total Bilirubin 0.3 - 1.2 mg/dL 0.9 - 0.4  Alkaline Phos 38 - 126 U/L 64 - 79  AST 15 - 41 U/L 42(H) - 44(H)  ALT 17 - 63 U/L 24 - 24    CBC Latest Ref Rng & Units 11/04/2015 11/03/2015 11/03/2015  WBC 4.0 - 10.5 K/uL 13.1(H) - 13.8(H)  Hemoglobin 13.0 - 17.0 g/dL 8.7(L) 8.2(L) 8.4(L)  Hematocrit 39.0 - 52.0 % 25.6(L) 24.0(L) 26.3(L)  Platelets 150 - 400 K/uL 327 - 278    ABG    Component Value Date/Time   PHART 7.381 11/04/2015 0330   PCO2ART 52.9 (H) 11/04/2015 0330   PO2ART 65.5 (L) 11/04/2015 0330   HCO3 30.7 (H) 11/04/2015 0330   TCO2 28 11/03/2015 1515   ACIDBASEDEF 1.0 10/29/2015 1531   O2SAT 91.3 11/04/2015 0330    CBG (last 3)   Recent Labs  11/03/15 2351 11/04/15 0357 11/04/15 0728  GLUCAP 161* 252* 144*    Imaging: Dg Chest Port 1 View  Result Date: 11/04/2015 CLINICAL DATA:  History of CABG EXAM: PORTABLE CHEST 1 VIEW COMPARISON:  11/03/2015 FINDINGS: Endotracheal tube tip between the clavicular heads and carina, unchanged. Tubing over the left upper neck is likely the feeding tube end rather than a IJ sheath. Thoracic drain present. Hazy bilateral basilar chest opacity, pleural fluid and probable underlying atelectasis in the study. No pneumothorax. Question mild improvement in edema. Cardiomegaly.  Status post CABG. IMPRESSION: Stable vascular congestion with layering effusions and basilar atelectasis. Electronically Signed   By: Monte Fantasia M.D.   On: 11/04/2015 07:56   Dg Chest Port 1 View  Result Date: 11/03/2015 CLINICAL DATA:  Intubation granuloma EXAM: PORTABLE CHEST 1 VIEW COMPARISON:  Earlier today FINDINGS: Endotracheal tube tip between the clavicular heads and carina. An orogastric tube  is visualized at least to the level of the diaphragm. A feeding tube is difficult to visualize due to underpenetration. A left-sided thoracic drain has been removed. Cardiopericardial enlargement is unchanged. Patient is status post CABG. Hazy appearance of the bilateral chest from atelectasis and pleural effusions. There may be a degree of pulmonary edema. Right upper extremity PICC with tip at the SVC level. No pneumothorax. IMPRESSION: 1. Pleural effusions and basilar atelectasis. 2. Interstitial coarsening is increased, possible new edema. 3. Interval removal of left thoracic drain.  No pneumothorax. Electronically Signed   By: Monte Fantasia M.D.   On: 11/03/2015 15:35   Dg Chest Port 1 View  Result  Date: 11/03/2015 CLINICAL DATA:  Status post CABG surgery. Intubated patient. Subsequent encounter. EXAM: PORTABLE CHEST 1 VIEW COMPARISON:  11/02/2015 and older exams. FINDINGS: There is persistent opacity at the lung bases, left greater than right, likely combination of pleural fluid and atelectasis. No new lung abnormalities. No evidence of a pneumothorax. Endotracheal tube, nasal/ orogastric tube, bilateral chest tubes and right PICC are stable. IMPRESSION: 1. No significant change from the previous day's study. 2. Persistent left greater than right lung base opacity most likely combination of atelectasis and pleural fluid. No convincing pulmonary edema. No pneumothorax. 3. Support apparatus is stable and well positioned. Electronically Signed   By: Lajean Manes M.D.   On: 11/03/2015 08:44    Studies:   Cultures: 10/18 sputum>>NF 10/19 sputum>>>NF 10/18 bcx 2>> NGTD 10/24 bcx >> MRSA   Antibiotics: 10/17 levaquin >>>10/24 10/17 Vancomycin >> 10/18  10/25 vanc >>> 10/24 aztreonam>>>  Events: 10/17 Admit, cardiac cath, emergent CABG due to NSTEMI. 10/18 0600 PCCM consulted for refractory hypoxia 10/25 reintubated for increased WOB and hypoxia.   Lines/tubes: 10/17 ETT>> 10/17 CT's x 3>>removed  x1 10/25>>> 10/17 Lt rad a line>>10/23 10/17 Rt IJ PA cath>>out 10/25 ETT>>   Summary: 81 yo male presented with increasing dyspnea and a fib with RVR.  Found to have 99% Lt main CAD and had emergent CABG 10/17.  Developed refractory hypoxia post-op and PCCM consulted.  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  Assessment/plan:  Acute hypoxic respiratory failure from HCAP and pulmonary edema. Hx of COPD. Upper airway edema - treated with steroid.  PFO- shunt? likely a factor Vocal cord edema - Pulmicort, duoneb re intubated multiple times, last 10/25. Will proceed to trach today.  Peep to 8 may be needed Re  oxygenate Steroids short course  NSTEMI s/p CABG. Cardiogenic shock. AFib RVR on amio. -lasix held per cvts - dopmaine per cvts, consider dc - continue ASA, lipitor, amio  AKI. Chronic kidney disease 2 >> baseline creatine 1.20. hypok  crt down Keep lasix on hold k supp  Nutrition. Hx of GERD - Tube feeds, increase T V to goal after trach - Protonix for SUP -BM needed, supp  Anemia after surgery and critical illness.  -coags wnl -sub q hep consider this, per cvts  DM type II controlled - SSI with levemir, keep same dose  Acute metabolic encephalopathy improving - RASS goal: -1 , prop with WUA -fent wua fully needed  ID Presumed hcap, some fevers, improved 10/25 BCX growing MRSE, vanc started back, he is at risk with prior hardware and picc, will keep vanc Continue vanc. Could consider d/cing aztreonam.    Tasrif Ahmed, PGY3.   STAFF NOTE: I, Merrie Roof, MD FACP have personally reviewed patient's available data,  including medical history, events of note, physical examination and test results as part of my evaluation. I have discussed with resident/NP and other care providers such as pharmacist, RN and RRT. In addition, I personally evaluated patient and elicited key findings of: awakens FC, reduced BS, abdo soft, Pao2 improved on abg, staph epi noted, unclear if pathogen or not, keep vanc, repeat BC in am , consider picc change, keep aztreonam, last echo no veg would be unlikely, I see crt down, BUN remains high and also received steroids for upper airway edema, dc decadron today after the 4th dose, place trach 6, hold lasix further and albumin, supp k, coags and plat wnl for procedure I call ed daughter and consented and updated her in full, goal post trach would be trach collar with HIGH flow O2 The patient is critically ill with multiple organ systems failure and requires high complexity decision making for assessment and support, frequent evaluation and  titration of therapies, application of advanced monitoring technologies and extensive interpretation of multiple databases.   Critical Care Time devoted to patient care services described in this note is 35 Minutes. This time reflects time of care of this signee: Merrie Roof, MD FACP. This critical care time does not reflect procedure time, or teaching time or supervisory time of PA/NP/Med student/Med Resident etc but could involve care discussion time. Rest per NP/medical resident whose note is outlined above and that I agree with   Mark Rivers. Mark Mould, MD, [place] Pgr: [facility] 11/04/2015 8:51 AM

## 2015-11-04 NOTE — Procedures (Signed)
[place] chest  1. Moderate effusion rt base free flowing, with significant compressive ATX rt lung 2. Small effusion left base  Lavon Paganini. Titus Mould, MD, [place] Pgr: [facility]

## 2015-11-05 ENCOUNTER — Inpatient Hospital Stay (HOSPITAL_COMMUNITY): Payer: Medicare Other

## 2015-11-05 DIAGNOSIS — R0602 Shortness of breath: Secondary | ICD-10-CM

## 2015-11-05 DIAGNOSIS — J90 Pleural effusion, not elsewhere classified: Secondary | ICD-10-CM

## 2015-11-05 LAB — POCT I-STAT, CHEM 8
BUN: 115 mg/dL — ABNORMAL HIGH (ref 6–20)
Calcium, Ion: 1.08 mmol/L — ABNORMAL LOW (ref 1.15–1.40)
Chloride: 111 mmol/L (ref 101–111)
Creatinine, Ser: 1.8 mg/dL — ABNORMAL HIGH (ref 0.61–1.24)
Glucose, Bld: 136 mg/dL — ABNORMAL HIGH (ref 65–99)
HCT: 26 % — ABNORMAL LOW (ref 39.0–52.0)
Hemoglobin: 8.8 g/dL — ABNORMAL LOW (ref 13.0–17.0)
Potassium: 3 mmol/L — ABNORMAL LOW (ref 3.5–5.1)
Sodium: 154 mmol/L — ABNORMAL HIGH (ref 135–145)
TCO2: 31 mmol/L (ref 0–100)

## 2015-11-05 LAB — COMPREHENSIVE METABOLIC PANEL
ALT: 27 U/L (ref 17–63)
AST: 45 U/L — ABNORMAL HIGH (ref 15–41)
Albumin: 2 g/dL — ABNORMAL LOW (ref 3.5–5.0)
Alkaline Phosphatase: 58 U/L (ref 38–126)
Anion gap: 12 (ref 5–15)
BUN: 116 mg/dL — ABNORMAL HIGH (ref 6–20)
CO2: 26 mmol/L (ref 22–32)
Calcium: 7.1 mg/dL — ABNORMAL LOW (ref 8.9–10.3)
Chloride: 105 mmol/L (ref 101–111)
Creatinine, Ser: 1.65 mg/dL — ABNORMAL HIGH (ref 0.61–1.24)
GFR calc Af Amer: 44 mL/min — ABNORMAL LOW (ref >60)
GFR calc non Af Amer: 38 mL/min — ABNORMAL LOW (ref >60)
Glucose, Bld: 239 mg/dL — ABNORMAL HIGH (ref 65–99)
Potassium: 3.7 mmol/L (ref 3.5–5.1)
Sodium: 143 mmol/L (ref 135–145)
Total Bilirubin: 0.8 mg/dL (ref 0.3–1.2)
Total Protein: 4.9 g/dL — ABNORMAL LOW (ref 6.5–8.1)

## 2015-11-05 LAB — CBC
HCT: 24.7 % — ABNORMAL LOW (ref 39.0–52.0)
Hemoglobin: 8.4 g/dL — ABNORMAL LOW (ref 13.0–17.0)
MCH: 31.8 pg (ref 26.0–34.0)
MCHC: 34 g/dL (ref 30.0–36.0)
MCV: 93.6 fL (ref 78.0–100.0)
Platelets: 419 10*3/uL — ABNORMAL HIGH (ref 150–400)
RBC: 2.64 MIL/uL — ABNORMAL LOW (ref 4.22–5.81)
RDW: 16.2 % — ABNORMAL HIGH (ref 11.5–15.5)
WBC: 13.5 10*3/uL — ABNORMAL HIGH (ref 4.0–10.5)

## 2015-11-05 LAB — CULTURE, BLOOD (ROUTINE X 2)

## 2015-11-05 LAB — GRAM STAIN

## 2015-11-05 LAB — PROCALCITONIN: Procalcitonin: 0.24 ng/mL

## 2015-11-05 LAB — GLUCOSE, CAPILLARY
Glucose-Capillary: 169 mg/dL — ABNORMAL HIGH (ref 65–99)
Glucose-Capillary: 176 mg/dL — ABNORMAL HIGH (ref 65–99)
Glucose-Capillary: 137 mg/dL — ABNORMAL HIGH (ref 65–99)
Glucose-Capillary: 111 mg/dL — ABNORMAL HIGH (ref 65–99)
Glucose-Capillary: 125 mg/dL — ABNORMAL HIGH (ref 65–99)
Glucose-Capillary: 111 mg/dL — ABNORMAL HIGH (ref 65–99)

## 2015-11-05 LAB — TRIGLYCERIDES: TRIGLYCERIDES: 268 mg/dL — AB (ref <150)

## 2015-11-05 MED ORDER — AMIODARONE HCL 200 MG PO TABS
200.0000 mg | ORAL_TABLET | Freq: Two times a day (BID) | ORAL | Status: DC
  Administered 2015-11-05 – 2015-11-07 (×6): 200 mg via ORAL
  Filled 2015-11-05 (×6): qty 1

## 2015-11-05 MED ORDER — MIDAZOLAM HCL 2 MG/2ML IJ SOLN
2.0000 mg | INTRAMUSCULAR | Status: DC | PRN
  Administered 2015-11-05 – 2015-11-06 (×4): 2 mg via INTRAVENOUS
  Filled 2015-11-05 (×4): qty 2

## 2015-11-05 MED ORDER — VITAL HIGH PROTEIN PO LIQD
1000.0000 mL | ORAL | Status: DC
  Administered 2015-11-05 – 2015-11-06 (×2): 1000 mL

## 2015-11-05 MED ORDER — LIDOCAINE HCL (PF) 1 % IJ SOLN
30.0000 mL | Freq: Once | INTRAMUSCULAR | Status: AC
  Administered 2015-11-05: 30 mL via INTRADERMAL
  Filled 2015-11-05: qty 30

## 2015-11-05 MED ORDER — POTASSIUM CHLORIDE 20 MEQ/15ML (10%) PO SOLN
20.0000 meq | Freq: Every day | ORAL | Status: DC
  Administered 2015-11-05 – 2015-11-14 (×10): 20 meq via ORAL
  Filled 2015-11-05 (×10): qty 15

## 2015-11-05 MED ORDER — POTASSIUM CHLORIDE 10 MEQ/50ML IV SOLN
10.0000 meq | INTRAVENOUS | Status: AC
  Administered 2015-11-05 (×3): 10 meq via INTRAVENOUS

## 2015-11-05 MED ORDER — FENTANYL CITRATE (PF) 100 MCG/2ML IJ SOLN
50.0000 ug | INTRAMUSCULAR | Status: DC
  Administered 2015-11-05 – 2015-11-08 (×17): 50 ug via INTRAVENOUS
  Filled 2015-11-05 (×17): qty 2

## 2015-11-05 MED ORDER — FENTANYL CITRATE (PF) 100 MCG/2ML IJ SOLN
50.0000 ug | INTRAMUSCULAR | Status: DC | PRN
  Administered 2015-11-06 – 2015-11-16 (×23): 50 ug via INTRAVENOUS
  Filled 2015-11-05 (×23): qty 2

## 2015-11-05 MED ORDER — FREE WATER
200.0000 mL | Freq: Three times a day (TID) | Status: DC
  Administered 2015-11-05 – 2015-11-06 (×5): 200 mL

## 2015-11-05 NOTE — Progress Notes (Signed)
[facility] [place] Progress Note Patient Name: Caelan Moriarity DOB: [birth year] MRN: [id number]   Date of Service  11/05/2015  HPI/Events of Note  Hypernatremia with Na of 150  eICU Interventions  FW 200 cc q8 hours Monitor NA response     Intervention Category Minor Interventions: Electrolytes abnormality - evaluation and management  Madalena Kesecker 11/05/2015, 1:03 AM

## 2015-11-05 NOTE — Care Management Important Message (Signed)
Important Message  Patient Details  Name: Mark Rivers MRN: [id number] Date of Birth: 03/11/1934   Medicare Important Message Given:  Yes    Nathen May 11/05/2015, 2:38 PM

## 2015-11-05 NOTE — Progress Notes (Signed)
PCCM PROGRESS NOTE   Admission date:  10/25/2015 Consult date:  10/27/2015 Referring provider:  Dr. Prescott Gum  CC:  Short of breath  Subjective:  On trach. Hypernatremia corrected with free water.   Vital signs: BP 128/61   Pulse (!) 57   Temp 97.5 F (36.4 C) (Oral)   Resp 16   Ht 5\' 6"  (1.676 m)   Wt 235 lb 14.3 oz (107 kg)   SpO2 99%   BMI 38.07 kg/m    Intake/output: I/O last 3 completed shifts: In: 4447.7 [I.V.:2541.3; NG/GT:1356.3; IV Piggyback:550] Out: 4600 [Urine:4400; Emesis/NG output:150; Chest Tube:50]  Hemodynamics:    Vent settings: Vent Mode: PCV FiO2 (%):  [60 %-100 %] 60 % Set Rate:  [16 bmp] 16 bmp PEEP:  [5 cmH20-10 cmH20] 10 cmH20 Plateau Pressure:  [16 cmH20-19 cmH20] 18 cmH20   General: ill appearing Neuro: RASS -1 HEENT: ETT , obese neck Cardiac: s1 s2 IRR Chest: coarse BS. ?diminished on right base.   Abd: soft, present BS, no r/g Ext: 1+ edema Skin: no rashes  LABS: CMP Latest Ref Rng & Units 11/05/2015 11/04/2015 11/04/2015  Glucose 65 - 99 mg/dL 239(H) 180(H) 268(H)  BUN 6 - 20 mg/dL 116(H) 110(H) 99(H)  Creatinine 0.61 - 1.24 mg/dL 1.65(H) 2.00(H) 1.82(H)  Sodium 135 - 145 mmol/L 143 150(H) 140  Potassium 3.5 - 5.1 mmol/L 3.7 3.3(L) 3.3(L)  Chloride 101 - 111 mmol/L 105 108 103  CO2 22 - 32 mmol/L 26 - 26  Calcium 8.9 - 10.3 mg/dL 7.1(L) - 7.0(L)  Total Protein 6.5 - 8.1 g/dL 4.9(L) - 5.2(L)  Total Bilirubin 0.3 - 1.2 mg/dL 0.8 - 0.9  Alkaline Phos 38 - 126 U/L 58 - 64  AST 15 - 41 U/L 45(H) - 42(H)  ALT 17 - 63 U/L 27 - 24    CBC Latest Ref Rng & Units 11/05/2015 11/04/2015 11/04/2015  WBC 4.0 - 10.5 K/uL 13.5(H) - 13.1(H)  Hemoglobin 13.0 - 17.0 g/dL 8.4(L) 9.2(L) 8.7(L)  Hematocrit 39.0 - 52.0 % 24.7(L) 27.0(L) 25.6(L)  Platelets 150 - 400 K/uL 419(H) - 327    ABG    Component Value Date/Time   PHART 7.381 11/04/2015 0330   PCO2ART 52.9 (H) 11/04/2015 0330   PO2ART 65.5 (L) 11/04/2015 0330   HCO3 30.7 (H)  11/04/2015 0330   TCO2 29 11/04/2015 1730   ACIDBASEDEF 1.0 10/29/2015 1531   O2SAT 91.3 11/04/2015 0330    CBG (last 3)   Recent Labs  11/04/15 1933 11/04/15 2344 11/05/15 0351  GLUCAP 178* 194* 169*    Imaging: Dg Chest Port 1 View  Result Date: 11/04/2015 CLINICAL DATA:  Acute respiratory failure. New tracheostomy. Recent CABG. EXAM: PORTABLE CHEST 1 VIEW COMPARISON:  11/04/2015 at 0544 hours FINDINGS: A new tracheostomy tube is present and overlies the airway. Enteric tube courses into the left upper abdomen. Sequelae of CABG are again identified. Right PICC terminates over the lower SVC. Right-sided chest tube remains in place. The cardiac silhouette remains enlarged. Pulmonary vascular congestion is unchanged. There is slightly improved aeration of the lung bases, with bibasilar opacities likely reflecting a combination of layering pleural effusions and atelectasis. No pneumothorax is identified. IMPRESSION: Slightly improved aeration of the lung bases. Persistent pulmonary vascular congestion, bibasilar atelectasis, and bilateral pleural effusions. Electronically Signed   By: Logan Bores M.D.   On: 11/04/2015 10:23   Dg Chest Port 1 View  Result Date: 11/04/2015 CLINICAL DATA:  History of CABG EXAM: PORTABLE CHEST  1 VIEW COMPARISON:  11/03/2015 FINDINGS: Endotracheal tube tip between the clavicular heads and carina, unchanged. Tubing over the left upper neck is likely the feeding tube end rather than a IJ sheath. Thoracic drain present. Hazy bilateral basilar chest opacity, pleural fluid and probable underlying atelectasis in the study. No pneumothorax. Question mild improvement in edema. Cardiomegaly.  Status post CABG. IMPRESSION: Stable vascular congestion with layering effusions and basilar atelectasis. Electronically Signed   By: Monte Fantasia M.D.   On: 11/04/2015 07:56   Dg Chest Port 1 View  Result Date: 11/03/2015 CLINICAL DATA:  Intubation granuloma EXAM: PORTABLE  CHEST 1 VIEW COMPARISON:  Earlier today FINDINGS: Endotracheal tube tip between the clavicular heads and carina. An orogastric tube is visualized at least to the level of the diaphragm. A feeding tube is difficult to visualize due to underpenetration. A left-sided thoracic drain has been removed. Cardiopericardial enlargement is unchanged. Patient is status post CABG. Hazy appearance of the bilateral chest from atelectasis and pleural effusions. There may be a degree of pulmonary edema. Right upper extremity PICC with tip at the SVC level. No pneumothorax. IMPRESSION: 1. Pleural effusions and basilar atelectasis. 2. Interstitial coarsening is increased, possible new edema. 3. Interval removal of left thoracic drain.  No pneumothorax. Electronically Signed   By: Monte Fantasia M.D.   On: 11/03/2015 15:35    Studies:  [place] 10/26 moderate effusion rt with significant compressive atx, small eff to mod left  Cultures: 10/18 sputum>>NF 10/19 sputum>>>NF 10/18 bcx 2>> NGTD 10/24 bcx >> MRSE, contam likely >? 10/27 BC>>>   Antibiotics: 10/17 levaquin >>>10/24 10/17 Vancomycin >> 10/18  10/25 vanc >>> 10/24 aztreonam>>>plan 5 days , pending pct and clinical assessment   Events: 10/17 Admit, cardiac cath, emergent CABG due to NSTEMI. 10/18 0600 PCCM consulted for refractory hypoxia 10/25 reintubated for increased WOB and hypoxia.  10/26 trached.  Lines/tubes: 10/17 ETT>> 10/17 CT's x 3>>removed  x1 10/25>>> 10/17 Lt rad a line>>10/23 10/17 Rt IJ PA cath>>out 10/26 trach (DF)>>>   Summary: 82 yo male presented with increasing dyspnea and a fib with RVR.  Found to have 99% Lt main CAD and had emergent CABG 10/17.  Developed refractory hypoxia post-op and PCCM consulted.  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  Assessment/plan:  Acute hypoxic respiratory failure from HCAP and pulmonary edema. Hx of COPD. Upper airway  edema - treated with steroid.  PFO- shunt? likely a factor Vocal cord edema R sided effusion. - Pulmicort, duoneb Trached 10/26. Wean to PEEP goal 8 , fio2 goal 50% today R sided effusion, CVTS planning to place new chest tube on right.   NSTEMI s/p CABG. Cardiogenic shock. AFib RVR on amio. -lasix held per cvts - dopmaine per cvts, consider dc - continue ASA, lipitor, amio, consider switching to PO amio?  AKI - improving.  Chronic kidney disease 2 >> baseline creatine 1.20. hypok  Hypernatremia - treated with free water.   k supp ?hold lasix as gave free water to correct hyperNa+  Nutrition. Hx of GERD - Tube feeds, will increase TF volumes today.  - Protonix for SUP  Anemia after surgery and critical illness.  -coags wnl -sub q hep consider this, per cvts  DM type II controlled - SSI with levemir, keep same dose  Acute metabolic encephalopathy improving - RASS goal: -1 , prop with WUA -fent wua fully needed  ID Presumed hcap, some fevers, improved 10/25  [place] growing MRSE, vanc started back, will keep vanc until further cultures are negative.  If bcx still remains positive, will have to take out PICC.   Tasrif Ahmed, PGY3.    STAFF NOTE: I, Merrie Roof, MD FACP have personally reviewed patient's available data, including medical history, events of note, physical examination and test results as part of my evaluation. I have discussed with resident/NP and other care providers such as pharmacist, RN and RRT. In addition, I personally evaluated patient and elicited key findings of: awake, FC , coarse BS, trach clean, reduced BS, pcxr somewhat better but with layering effusion, moderate effusion with compression ATX rt on [place], would have chest tube rt placed to drain, lasix will not resolve this effusion, NA noted, treated free water and improved, crt down,goal fio2 to 50% then peep to 8 if able, increase TF and observe tolerability, chem in am , mobilize soon as  able, will plan 5 days aztreonam for concern s PNA when he had fevers and thick secretions, may shorten duration if pct unimpressive, vanc to remain until we see repeat BC negative, major goal dc prop, fent to goal off with int scheduled  The patient is critically ill with multiple organ systems failure and requires high complexity decision making for assessment and support, frequent evaluation and titration of therapies, application of advanced monitoring technologies and extensive interpretation of multiple databases.   Critical Care Time devoted to patient care services described in this note is 35 Minutes. This time reflects time of care of this signee: Merrie Roof, MD FACP. This critical care time does not reflect procedure time, or teaching time or supervisory time of PA/NP/Med student/Med Resident etc but could involve care discussion time. Rest per NP/medical resident whose note is outlined above and that I agree with   Lavon Paganini. Titus Mould, MD, [place] Pgr: [facility] 11/05/2015 10:37 AM

## 2015-11-05 NOTE — Progress Notes (Signed)
[facility] Progress Note Patient Name: Mark Rivers DOB: 20-Nov-1934 MRN: [id number]   Date of Service  11/05/2015  HPI/Events of Note  Nurse reporting 2 large loose stools.  No fever.  WBC 13K but unchanged.  On TFs.  Some skin breakdown and stool contaminating harvest site  eICU Interventions  Plan: Rectal tube Hold on C Diff test for now unless clinical status changes     Intervention Category Intermediate Interventions: Other:  Zaron Zwiefelhofer 11/05/2015, 11:12 PM

## 2015-11-05 NOTE — Progress Notes (Signed)
Patient ID: Mark Rivers, male   DOB: 03/05/1934, 81 y.o.   MRN: [id number] EVENING ROUNDS NOTE :     [address]       [place] [address]             [phone]                 10 Days Post-Op Procedure(s) (LRB): CORONARY ARTERY BYPASS GRAFTING (CABG) x3(LIMA to LA, SVG to OM1, SVG to PDA) with EVH from the left thigh and partial lower leg greater saphenous vein and left internal mammary artery (N/A) TRANSESOPHAGEAL ECHOCARDIOGRAM (TEE) (N/A) INTRA-AORTIC BALLOON PUMP INSERTION size 40cc (Left)  Total Length of Stay:  LOS: 11 days  BP 119/63   Pulse 87   Temp 97.5 F (36.4 C) (Oral)   Resp (!) 21   Ht 5\' 6"  (1.676 m)   Wt 235 lb 14.3 oz (107 kg)   SpO2 95%   BMI 38.07 kg/m   .Intake/Output      10/26 0701 - 10/27 0700 10/27 0701 - 10/28 0700   I.V. (mL/kg) 1709.7 (16) 350.3 (3.3)   NG/GT 1176.3 120   IV Piggyback 250    Total Intake(mL/kg) 3136.1 (29.3) 470.3 (4.4)   Urine (mL/kg/hr) 3000 (1.2) 1200 (1.1)   Emesis/NG output 0 (0) 0 (0)   Stool     Chest Tube 0 (0) 300 (0.3)   Total Output 3000 1500   Net +136.1 -1029.7          . sodium chloride Stopped (11/03/15 2000)  . sodium chloride    . sodium chloride 10 mL/hr at 11/05/15 0700  . amiodarone 30 mg/hr (11/05/15 0900)  . DOPamine 3 mcg/kg/min (11/05/15 0700)  . feeding supplement (VITAL HIGH PROTEIN) 1,000 mL (11/05/15 1530)  . fentaNYL infusion INTRAVENOUS 100 mcg/hr (11/05/15 0924)     Lab Results  Component Value Date   WBC 13.5 (H) 11/05/2015   HGB 8.4 (L) 11/05/2015   HCT 24.7 (L) 11/05/2015   PLT 419 (H) 11/05/2015   GLUCOSE 239 (H) 11/05/2015   TRIG 268 (H) 11/05/2015   ALT 27 11/05/2015   AST 45 (H) 11/05/2015   NA 143 11/05/2015   K 3.7 11/05/2015   CL 105 11/05/2015   CREATININE 1.65 (H) 11/05/2015   BUN 116 (H) 11/05/2015   CO2 26 11/05/2015   TSH 3.494 10/26/2015   INR 1.35 11/03/2015   New right chest tube today Now  trach in place and  on vent   [name]  Beeper [phone] Office [phone] 11/05/2015 5:27 PM

## 2015-11-05 NOTE — Progress Notes (Signed)
10 Days Post-Op Procedure(s) (LRB): CORONARY ARTERY BYPASS GRAFTING (CABG) x3(LIMA to LA, SVG to OM1, SVG to PDA) with EVH from the left thigh and partial lower leg greater saphenous vein and left internal mammary artery (N/A) TRANSESOPHAGEAL ECHOCARDIOGRAM (TEE) (N/A) INTRA-AORTIC BALLOON PUMP INSERTION size 40cc (Left) Subjective: Patient more alert on tracheostomy Still edematous Right chest tube placed for drainage of 500 cc of xanthochromic fluid Sinus rhythm a paste  Objective: Vital signs in last 24 hours: Temp:  [96.3 F (35.7 C)-98.5 F (36.9 C)] 98.5 F (36.9 C) (10/27 1932) Pulse Rate:  [57-89] 88 (10/27 1800) Cardiac Rhythm: Atrial paced (10/27 1416) Resp:  [8-22] 19 (10/27 1800) BP: (113-140)/(53-111) 135/69 (10/27 1800) SpO2:  [92 %-100 %] 94 % (10/27 1928) FiO2 (%):  [45 %-70 %] 45 % (10/27 1928) Weight:  [235 lb 14.3 oz (107 kg)] 235 lb 14.3 oz (107 kg) (10/27 0500)  Hemodynamic parameters for last 24 hours:    Intake/Output from previous day: 10/26 0701 - 10/27 0700 In: 3136.1 [I.V.:1709.7; NG/GT:1176.3; IV Piggyback:250] Out: 3000 [Urine:3000] Intake/Output this shift: No intake/output data recorded.       Exam    General- alert and comfortable   Lungs- clear without rales, wheezes   Cor- regular rate and rhythm, no murmur , gallop   Abdomen- soft, non-tender   Extremities - warm, non-tender, minimal edema   Neuro- oriented, appropriate, no focal weakness   Lab Results:  Recent Labs  11/04/15 0400  11/05/15 0435 11/05/15 1803  WBC 13.1*  --  13.5*  --   HGB 8.7*  < > 8.4* 8.8*  HCT 25.6*  < > 24.7* 26.0*  PLT 327  --  419*  --   < > = values in this interval not displayed. BMET:  Recent Labs  11/04/15 0400  11/05/15 0435 11/05/15 1803  NA 140  < > 143 154*  K 3.3*  < > 3.7 3.0*  CL 103  < > 105 111  CO2 26  --  26  --   GLUCOSE 268*  < > 239* 136*  BUN 99*  < > 116* 115*  CREATININE 1.82*  < > 1.65* 1.80*  CALCIUM 7.0*  --  7.1*   --   < > = values in this interval not displayed.  PT/INR:  Recent Labs  11/03/15 1500  LABPROT 16.8*  INR 1.35   ABG    Component Value Date/Time   PHART 7.381 11/04/2015 0330   HCO3 30.7 (H) 11/04/2015 0330   TCO2 31 11/05/2015 1803   ACIDBASEDEF 1.0 10/29/2015 1531   O2SAT 91.3 11/04/2015 0330   CBG (last 3)   Recent Labs  11/05/15 1223 11/05/15 1627 11/05/15 1925  GLUCAP 137* 111* 125*    Assessment/Plan: S/P Procedure(s) (LRB): CORONARY ARTERY BYPASS GRAFTING (CABG) x3(LIMA to LA, SVG to OM1, SVG to PDA) with EVH from the left thigh and partial lower leg greater saphenous vein and left internal mammary artery (N/A) TRANSESOPHAGEAL ECHOCARDIOGRAM (TEE) (N/A) INTRA-AORTIC BALLOON PUMP INSERTION size 40cc (Left) Ventilator weaned per CCM Continue tube feeds and diuresis   LOS: 11 days    Tharon Aquas Trigt III 11/05/2015

## 2015-11-05 NOTE — Significant Event (Addendum)
Wasted approximately 100cc of fentanyl in sink-witness by [employer].   Waste was witnessed - Para March, RN

## 2015-11-06 ENCOUNTER — Inpatient Hospital Stay (HOSPITAL_COMMUNITY): Payer: Medicare Other

## 2015-11-06 LAB — CBC
HCT: 28.3 % — ABNORMAL LOW (ref 39.0–52.0)
Hemoglobin: 9 g/dL — ABNORMAL LOW (ref 13.0–17.0)
MCH: 29.9 pg (ref 26.0–34.0)
MCHC: 31.8 g/dL (ref 30.0–36.0)
MCV: 94 fL (ref 78.0–100.0)
Platelets: 494 10*3/uL — ABNORMAL HIGH (ref 150–400)
RBC: 3.01 MIL/uL — ABNORMAL LOW (ref 4.22–5.81)
RDW: 16.1 % — ABNORMAL HIGH (ref 11.5–15.5)
WBC: 15.3 10*3/uL — ABNORMAL HIGH (ref 4.0–10.5)

## 2015-11-06 LAB — BASIC METABOLIC PANEL
ANION GAP: 7 (ref 5–15)
BUN: 106 mg/dL — AB (ref 6–20)
CO2: 29 mmol/L (ref 22–32)
Calcium: 8.2 mg/dL — ABNORMAL LOW (ref 8.9–10.3)
Chloride: 120 mmol/L — ABNORMAL HIGH (ref 101–111)
Creatinine, Ser: 1.42 mg/dL — ABNORMAL HIGH (ref 0.61–1.24)
GFR calc Af Amer: 53 mL/min — ABNORMAL LOW (ref >60)
GFR, EST NON AFRICAN AMERICAN: 45 mL/min — AB (ref >60)
GLUCOSE: 190 mg/dL — AB (ref 65–99)
POTASSIUM: 4.1 mmol/L (ref 3.5–5.1)
Sodium: 156 mmol/L — ABNORMAL HIGH (ref 135–145)

## 2015-11-06 LAB — GLUCOSE, CAPILLARY
Glucose-Capillary: 114 mg/dL — ABNORMAL HIGH (ref 65–99)
Glucose-Capillary: 128 mg/dL — ABNORMAL HIGH (ref 65–99)
Glucose-Capillary: 148 mg/dL — ABNORMAL HIGH (ref 65–99)
Glucose-Capillary: 151 mg/dL — ABNORMAL HIGH (ref 65–99)
Glucose-Capillary: 155 mg/dL — ABNORMAL HIGH (ref 65–99)
Glucose-Capillary: 155 mg/dL — ABNORMAL HIGH (ref 65–99)

## 2015-11-06 LAB — COMPREHENSIVE METABOLIC PANEL
ALT: 28 U/L (ref 17–63)
AST: 40 U/L (ref 15–41)
Albumin: 2.1 g/dL — ABNORMAL LOW (ref 3.5–5.0)
Alkaline Phosphatase: 69 U/L (ref 38–126)
Anion gap: 7 (ref 5–15)
BUN: 114 mg/dL — ABNORMAL HIGH (ref 6–20)
CO2: 31 mmol/L (ref 22–32)
Calcium: 7.9 mg/dL — ABNORMAL LOW (ref 8.9–10.3)
Chloride: 116 mmol/L — ABNORMAL HIGH (ref 101–111)
Creatinine, Ser: 1.39 mg/dL — ABNORMAL HIGH (ref 0.61–1.24)
GFR calc Af Amer: 54 mL/min — ABNORMAL LOW (ref >60)
GFR calc non Af Amer: 47 mL/min — ABNORMAL LOW (ref >60)
Glucose, Bld: 172 mg/dL — ABNORMAL HIGH (ref 65–99)
Potassium: 2.9 mmol/L — ABNORMAL LOW (ref 3.5–5.1)
Sodium: 154 mmol/L — ABNORMAL HIGH (ref 135–145)
Total Bilirubin: 0.7 mg/dL (ref 0.3–1.2)
Total Protein: 5.6 g/dL — ABNORMAL LOW (ref 6.5–8.1)

## 2015-11-06 LAB — C DIFFICILE QUICK SCREEN W PCR REFLEX
C DIFFICILE (CDIFF) INTERP: NOT DETECTED
C Diff antigen: NEGATIVE
C Diff toxin: NEGATIVE

## 2015-11-06 LAB — PROCALCITONIN: Procalcitonin: 0.27 ng/mL

## 2015-11-06 MED ORDER — POTASSIUM CHLORIDE 20 MEQ/15ML (10%) PO SOLN
40.0000 meq | Freq: Once | ORAL | Status: AC
  Administered 2015-11-06: 40 meq
  Filled 2015-11-06: qty 30

## 2015-11-06 MED ORDER — GERHARDT'S BUTT CREAM
TOPICAL_CREAM | CUTANEOUS | Status: DC | PRN
  Administered 2015-11-07 – 2015-11-21 (×4): via TOPICAL
  Administered 2015-11-21: 1 via TOPICAL
  Administered 2015-11-21: 18:00:00 via TOPICAL
  Administered 2015-11-22: 1 via TOPICAL
  Filled 2015-11-06 (×4): qty 1

## 2015-11-06 MED ORDER — DEXTROSE 5 % IV SOLN
INTRAVENOUS | Status: DC
  Administered 2015-11-06 – 2015-11-10 (×7): via INTRAVENOUS
  Administered 2015-11-11 – 2015-11-12 (×2): 75 mL via INTRAVENOUS

## 2015-11-06 MED ORDER — POTASSIUM CHLORIDE 10 MEQ/50ML IV SOLN
10.0000 meq | Freq: Once | INTRAVENOUS | Status: AC
  Administered 2015-11-06: 10 meq via INTRAVENOUS
  Filled 2015-11-06: qty 50

## 2015-11-06 MED ORDER — POTASSIUM CHLORIDE 10 MEQ/50ML IV SOLN
10.0000 meq | INTRAVENOUS | Status: AC | PRN
  Administered 2015-11-06 – 2015-11-11 (×3): 10 meq via INTRAVENOUS

## 2015-11-06 MED ORDER — GERHARDT'S BUTT CREAM
TOPICAL_CREAM | Freq: Two times a day (BID) | CUTANEOUS | Status: DC
  Administered 2015-11-06: 11:00:00 via TOPICAL
  Filled 2015-11-06: qty 1

## 2015-11-06 NOTE — Op Note (Signed)
NAMEJOFFRE, CABREROS NO.:  [id number]  MEDICAL RECORD NO.:  [id number]  LOCATION:  2S03C                        FACILITY:  [facility]  PHYSICIAN:  Ivin Poot, M.D.  DATE OF BIRTH:  1934-04-24  DATE OF PROCEDURE:  11/05/2015 DATE OF DISCHARGE:                              OPERATIVE REPORT   OPERATION:  Placement of right chest tube.  SURGEON:  Ivin Poot, M.D.  PREOPERATIVE DIAGNOSIS:  Right pleural effusion status post emergency coronary artery bypass grafting, ventilator dependent.  POSTOPERATIVE DIAGNOSIS:  Right pleural effusion status post emergency coronary artery bypass grafting, ventilator dependent.  DESCRIPTION OF PROCEDURE:  After informed consent was obtained from the patient's family and the indications for chest tube placement to drain a postop pleural effusion to optimize pulmonary status for ventilator weaning, the patient was prepped and draped on the right chest.  A proper time-out was performed.  1% lidocaine was infiltrated in the fifth interspace at the anterior axillary line.  A small incision was made.  Dissection down to the chest wall was completed.  Further local anesthesia was inserted into the intercostal muscle.  Using a hemostat, the pleural space was entered.  This was confirmed by finger tip placement.  A 28-French chest tube was then directed into the pleural space and xanthochromic fluid approximately 400 mL was drained.  Some of this was sent for microbiology-culture.  The tube was secured to the skin with a silk suture, the tube was connected to the Pleur-evac, and a sterile dressing was applied.     Ivin Poot, M.D.     PV/MEDQ  D:  11/05/2015  T:  11/06/2015  Job:  [id number]

## 2015-11-06 NOTE — Progress Notes (Signed)
Patient ID: Mark Rivers, male   DOB: 1934/11/06, 81 y.o.   MRN: [id number] TCTS DAILY ICU PROGRESS NOTE                   [address]            [place] [address]          [phone]   11 Days Post-Op Procedure(s) (LRB): CORONARY ARTERY BYPASS GRAFTING (CABG) x3(LIMA to LA, SVG to OM1, SVG to PDA) with EVH from the left thigh and partial lower leg greater saphenous vein and left internal mammary artery (N/A) TRANSESOPHAGEAL ECHOCARDIOGRAM (TEE) (N/A) INTRA-AORTIC BALLOON PUMP INSERTION size 40cc (Left)  Total Length of Stay:  LOS: 12 days   Subjective: More alert since trach  Objective: Vital signs in last 24 hours: Temp:  [96.3 F (35.7 C)-99.1 F (37.3 C)] 99.1 F (37.3 C) (10/27 2319) Pulse Rate:  [57-106] 104 (10/28 0719) Cardiac Rhythm: Atrial fibrillation;Ventricular paced (10/28 0400) Resp:  [8-22] 19 (10/28 0719) BP: (113-143)/(50-111) 122/84 (10/28 0719) SpO2:  [92 %-100 %] 97 % (10/28 0719) FiO2 (%):  [45 %-60 %] 50 % (10/28 0719) Weight:  [229 lb 15 oz (104.3 kg)] 229 lb 15 oz (104.3 kg) (10/28 0430)  Filed Weights   11/04/15 0500 11/05/15 0500 11/06/15 0430  Weight: 231 lb 11.3 oz (105.1 kg) 235 lb 14.3 oz (107 kg) 229 lb 15 oz (104.3 kg)    Weight change: -5 lb 15.2 oz (-2.7 kg)   Hemodynamic parameters for last 24 hours:    Intake/Output from previous day: 10/27 0701 - 10/28 0700 In: 2192 [I.V.:899; VE/HM:0947; IV Piggyback:150] Out: 0962 [Urine:3600; Chest Tube:550]  Intake/Output this shift: No intake/output data recorded.  Current Meds: Scheduled Meds: . amiodarone  200 mg Oral BID  . aspirin EC  325 mg Oral Daily   Or  . aspirin  324 mg Per Tube Daily  . atorvastatin  80 mg Oral q1800  . aztreonam  2 g Intravenous Q8H  . bisacodyl  10 mg Oral Daily   Or  . bisacodyl  10 mg Rectal Daily  . budesonide (PULMICORT) nebulizer solution  0.5 mg Nebulization BID  . chlorhexidine gluconate (MEDLINE KIT)  15 mL Mouth Rinse  BID  . docusate  200 mg Oral Daily  . enoxaparin (LOVENOX) injection  30 mg Subcutaneous Q24H  . etomidate  40 mg Intravenous Once  . feeding supplement (PRO-STAT SUGAR FREE 64)  60 mL Per Tube TID  . fentaNYL (SUBLIMAZE) injection  50 mcg Intravenous Q4H  . free water  200 mL Per Tube Q8H  . furosemide  40 mg Intravenous Daily  . hydrocortisone   Topical BID  . insulin aspart  0-24 Units Subcutaneous Q4H  . insulin detemir  8 Units Subcutaneous BID  . levalbuterol  0.63 mg Nebulization TID  . mouth rinse  15 mL Mouth Rinse 10 times per day  . metoCLOPramide (REGLAN) injection  10 mg Intravenous Q6H  . metoprolol tartrate  12.5 mg Per Tube BID   Or  . metoprolol tartrate  12.5 mg Oral BID  . midazolam  4 mg Intravenous Once  . neomycin-bacitracin-polymyxin   Topical BID  . pantoprazole sodium  40 mg Per Tube Q24H  . potassium chloride  20 mEq Oral Daily  . sodium chloride flush  10-40 mL Intracatheter Q12H  . sodium chloride flush  3 mL Intravenous Q12H  . vancomycin  1,250 mg Intravenous Q24H  . vecuronium  10 mg Intravenous Once   Continuous Infusions: . sodium chloride Stopped (11/03/15 2000)  . sodium chloride    . sodium chloride 10 mL/hr at 11/06/15 0500  . DOPamine 3 mcg/kg/min (11/06/15 0500)  . feeding supplement (VITAL HIGH PROTEIN) Stopped (11/06/15 0700)  . fentaNYL infusion INTRAVENOUS Stopped (11/05/15 1700)   PRN Meds:.sodium chloride, acetaminophen, azelastine, fentaNYL (SUBLIMAZE) injection, levalbuterol, metoprolol, midazolam, morphine injection, ondansetron (ZOFRAN) IV, sodium chloride flush, sodium chloride flush, traZODone  General appearance: cooperative and mild distress Neurologic: intact Heart: irregularly irregular rhythm Lungs: diminished breath sounds bibasilar Abdomen: soft, non-tender; bowel sounds normal; no masses,  no organomegaly Extremities: extremities normal, atraumatic, no cyanosis or edema and Homans sign is negative, no sign of  DVT Wound: sternum intact Vomiting this am  Lab Results: CBC: Recent Labs  11/05/15 0435 11/05/15 1803 11/06/15 0415  WBC 13.5*  --  15.3*  HGB 8.4* 8.8* 9.0*  HCT 24.7* 26.0* 28.3*  PLT 419*  --  494*   BMET:  Recent Labs  11/05/15 0435 11/05/15 1803 11/06/15 0415  NA 143 154* 154*  K 3.7 3.0* 2.9*  CL 105 111 116*  CO2 26  --  31  GLUCOSE 239* 136* 172*  BUN 116* 115* 114*  CREATININE 1.65* 1.80* 1.39*  CALCIUM 7.1*  --  7.9*    CMET: Lab Results  Component Value Date   WBC 15.3 (H) 11/06/2015   HGB 9.0 (L) 11/06/2015   HCT 28.3 (L) 11/06/2015   PLT 494 (H) 11/06/2015   GLUCOSE 172 (H) 11/06/2015   TRIG 268 (H) 11/05/2015   ALT 28 11/06/2015   AST 40 11/06/2015   NA 154 (H) 11/06/2015   K 2.9 (L) 11/06/2015   CL 116 (H) 11/06/2015   CREATININE 1.39 (H) 11/06/2015   BUN 114 (H) 11/06/2015   CO2 31 11/06/2015   TSH 3.494 10/26/2015   INR 1.35 11/03/2015    PT/INR:  Recent Labs  11/03/15 1500  LABPROT 16.8*  INR 1.35   Radiology: Dg Chest Port 1 View  Result Date: 11/05/2015 CLINICAL DATA:  Chest tube placement EXAM: PORTABLE CHEST 1 VIEW COMPARISON:  11/05/2015 chest radiograph performed 0736 hours FINDINGS: A second right-sided chest tube is seen from lateral approach with tip projecting up to the right posterior fourth rib level and side port seen and the posterior eighth rib level. Pre-existing right-sided chest tube is noted unchanged in position with side-port overlying the right sixth costovertebral juncture. Patient slightly rotated. Confluent opacity at the left lung base may represent compressive atelectasis and effusion. Mild vascular congestion is noted. No pneumothorax is apparent. Tracheostomy tube tip is seen above the carina at the level of the aortic arch. Gastric tube extends into the expected location of the stomach. IMPRESSION: Two right-sided chest tubes as above described, one projecting up to the posterior right fourth rib level from  lateral to medial back child and the second paralleling the ribs between the posterior right sixth and seventh ribs. Left retrocardiac opacity with obscuration of the left hemidiaphragm and costophrenic angle presumably from effusion and compressive atelectasis. Mild vascular congestion is noted. Electronically Signed   By: Ashley Royalty M.D.   On: 11/05/2015 14:45   Dg Chest Port 1 View  Result Date: 11/05/2015 CLINICAL DATA:  Endotracheal intubation EXAM: PORTABLE CHEST 1 VIEW COMPARISON:  Yesterday FINDINGS: Feeding tube at least reaches the stomach. Mediastinal drain, central line, and tracheostomy tube in stable position. Improving pulmonary edema. Layering pleural effusions greater on the  left with presumed overlapping atelectasis. No pneumothorax. Stable cardiopericardial enlargement. Status post CABG. IMPRESSION: 1. Improved pulmonary edema. 2. Effusions and atelectasis greater on the left. Electronically Signed   By: Monte Fantasia M.D.   On: 11/05/2015 08:46     Assessment/Plan: S/P Procedure(s) (LRB): CORONARY ARTERY BYPASS GRAFTING (CABG) x3(LIMA to LA, SVG to OM1, SVG to PDA) with EVH from the left thigh and partial lower leg greater saphenous vein and left internal mammary artery (N/A) TRANSESOPHAGEAL ECHOCARDIOGRAM (TEE) (N/A) INTRA-AORTIC BALLOON PUMP INSERTION size 40cc (Left) Increase free water with persistent hypernatremia Abdominal film to check feeding tube placement with vomiting this morning Mild leukocytosis       Hypokalemia-being replaced  Grace Isaac 11/06/2015 7:24 AM

## 2015-11-06 NOTE — Progress Notes (Signed)
[facility] Progress Note Patient Name: Mark Rivers DOB: 07-29-1934 MRN: [id number]   Date of Service  11/06/2015  HPI/Events of Note  Sodium still climbing this afternoon  eICU Interventions  D5W increased from 75 cc/hr to 100 cc/hr     Intervention Category Minor Interventions: Electrolytes abnormality - evaluation and management  Mauri Brooklyn, P 11/06/2015, 7:36 PM

## 2015-11-06 NOTE — Progress Notes (Signed)
Patient ID: Mark Rivers, male   DOB: 1934-06-08, 81 y.o.   MRN: [id number] EVENING ROUNDS NOTE :     [address]       [place] [address]             [phone]                 11 Days Post-Op Procedure(s) (LRB): CORONARY ARTERY BYPASS GRAFTING (CABG) x3(LIMA to LA, SVG to OM1, SVG to PDA) with EVH from the left thigh and partial lower leg greater saphenous vein and left internal mammary artery (N/A) TRANSESOPHAGEAL ECHOCARDIOGRAM (TEE) (N/A) INTRA-AORTIC BALLOON PUMP INSERTION size 40cc (Left)  Total Length of Stay:  LOS: 12 days  BP 122/63 (BP Location: Right Arm)   Pulse 100   Temp 99.1 F (37.3 C) (Axillary)   Resp 16   Ht 5\' 6"  (1.676 m)   Wt 229 lb 15 oz (104.3 kg)   SpO2 97%   BMI 37.11 kg/m   .Intake/Output      10/28 0701 - 10/29 0700   P.O. 0   I.V. (mL/kg) 743.7 (7.1)   NG/GT 148.8   IV Piggyback 150   Total Intake(mL/kg) 1042.5 (10)   Urine (mL/kg/hr) 2505 (2)   Emesis/NG output 0 (0)   Stool 250 (0.2)   Chest Tube 20 (0)   Total Output 2775   Net -1732.5       Stool Occurrence 3 x   Emesis Occurrence 1 x     . dextrose 75 mL/hr at 11/06/15 1800  . feeding supplement (VITAL HIGH PROTEIN) Stopped (11/06/15 1700)  . fentaNYL infusion INTRAVENOUS Stopped (11/05/15 1700)     Lab Results  Component Value Date   WBC 15.3 (H) 11/06/2015   HGB 9.0 (L) 11/06/2015   HCT 28.3 (L) 11/06/2015   PLT 494 (H) 11/06/2015   GLUCOSE 190 (H) 11/06/2015   TRIG 268 (H) 11/05/2015   ALT 28 11/06/2015   AST 40 11/06/2015   NA 156 (H) 11/06/2015   K 4.1 11/06/2015   CL 120 (H) 11/06/2015   CREATININE 1.42 (H) 11/06/2015   BUN 106 (H) 11/06/2015   CO2 29 11/06/2015   TSH 3.494 10/26/2015   INR 1.35 11/03/2015   continuous stools, c diff negative Repeat episode of vomiting, tube feeding stopped Abdomen soft not tender    [name]  Beeper [phone] Office [phone] 11/06/2015 7:15 PM

## 2015-11-06 NOTE — Progress Notes (Signed)
[facility] Progress Note Patient Name: Mark Rivers DOB: [birth year] MRN: [id number]   Date of Service  11/06/2015  HPI/Events of Note  hypokalemia  eICU Interventions  Additional potassium replacement     Intervention Category Intermediate Interventions: Electrolyte abnormality - evaluation and management  Mark Rivers 11/06/2015, 5:23 AM

## 2015-11-06 NOTE — Progress Notes (Addendum)
Pharmacy Antibiotic Note  Lion Schrantz is a 81 y.o. male admitted on 10/25/2015 with pneumonia.  Pharmacy has been consulted for Vancomycin Day #4/aztreonam Day #4 dosing. BCx persistently 1/2 with MRSE. PICC to be removed today. Afebrile, WBC up 15.3. SCr improving, normalized CrCl~43, good UOP.  Plan: Aztreonam 2g IV q8h Vancomycin 1250 mg IV q24h Monitor clinical progress, c/s, renal function, abx plan/LOT VT soon if continuing  Height: 5\' 6"  (167.6 cm) Weight: 229 lb 15 oz (104.3 kg) IBW/kg (Calculated) : 63.8  Temp (24hrs), Avg:98.4 F (36.9 C), Min:97.5 F (36.4 C), Max:99.1 F (37.3 C)   Recent Labs Lab 11/02/15 0400  11/03/15 0356  11/04/15 0400 11/04/15 1730 11/05/15 0435 11/05/15 1803 11/06/15 0415  WBC 13.5*  --  13.8*  --  13.1*  --  13.5*  --  15.3*  CREATININE 2.28*  < > 2.28*  < > 1.82* 2.00* 1.65* 1.80* 1.39*  < > = values in this interval not displayed.  Estimated Creatinine Clearance: 48.8 mL/min (by C-G formula based on SCr of 1.39 mg/dL (H)).    Allergies  Allergen Reactions  . Amoxicillin Other (See Comments)    PATIENT PASSES OUT!!!!  . Cephalexin Other (See Comments)    PATIENT PASSES OUT!!!!  . Penicillin G Other (See Comments)    Has patient had a PCN reaction causing immediate rash, facial/tongue/throat swelling, SOB or lightheadedness with hypotension: Yes Has patient had a PCN reaction causing severe rash involving mucus membranes or skin necrosis: No Has patient had a PCN reaction that required hospitalization: Yes Has patient had a PCN reaction occurring within the last 10 years: Yes If all of the above answers are "NO", then may proceed with Cephalosporin use. PATIENT PASSES OUT!!!!    Elicia Lamp, PharmD, BCPS Clinical Pharmacist 11/06/2015 12:32 PM

## 2015-11-06 NOTE — Progress Notes (Signed)
Patient vomited profuse amounts of green emesis. TF stops, zofran given, MD contacted. Per Dr. Servando Snare he will see patient this evening.

## 2015-11-06 NOTE — Progress Notes (Addendum)
PCCM PROGRESS NOTE   Admission date:  10/25/2015 Consult date:  10/27/2015 Referring provider:  Dr. Prescott Gum  CC:  Short of breath  Subjective:  More awake Ct placed O2 needs down, peep down 5  Vital signs: BP 122/84   Pulse (!) 104   Temp 99.1 F (37.3 C) (Oral)   Resp 19   Ht 5\' 6"  (1.676 m)   Wt 104.3 kg (229 lb 15 oz)   SpO2 97%   BMI 37.11 kg/m    Intake/output: I/O last 3 completed shifts: In: 4038.6 [I.V.:1915.6; NG/GT:1923; IV Piggyback:200] Out: HB:[id number] [Urine:5475; Chest Tube:600]  Hemodynamics:    Vent settings: Vent Mode: PCV FiO2 (%):  [45 %-60 %] 50 % Set Rate:  [16 bmp] 16 bmp PEEP:  [8 cmH20] 8 cmH20 Plateau Pressure:  [16 cmH20-19 cmH20] 19 cmH20   General: more awake Neuro: RASS 0 HEENT: trach clean , obese neck Cardiac: s1 s2 IRR Chest: coarse BS improved rt base  Abd: soft, present BS, no r/g Ext: 2+ edema Skin: no rashes  LABS: CMP Latest Ref Rng & Units 11/06/2015 11/05/2015 11/05/2015  Glucose 65 - 99 mg/dL 172(H) 136(H) 239(H)  BUN 6 - 20 mg/dL 114(H) 115(H) 116(H)  Creatinine 0.61 - 1.24 mg/dL 1.39(H) 1.80(H) 1.65(H)  Sodium 135 - 145 mmol/L 154(H) 154(H) 143  Potassium 3.5 - 5.1 mmol/L 2.9(L) 3.0(L) 3.7  Chloride 101 - 111 mmol/L 116(H) 111 105  CO2 22 - 32 mmol/L 31 - 26  Calcium 8.9 - 10.3 mg/dL 7.9(L) - 7.1(L)  Total Protein 6.5 - 8.1 g/dL 5.6(L) - 4.9(L)  Total Bilirubin 0.3 - 1.2 mg/dL 0.7 - 0.8  Alkaline Phos 38 - 126 U/L 69 - 58  AST 15 - 41 U/L 40 - 45(H)  ALT 17 - 63 U/L 28 - 27    CBC Latest Ref Rng & Units 11/06/2015 11/05/2015 11/05/2015  WBC 4.0 - 10.5 K/uL 15.3(H) - 13.5(H)  Hemoglobin 13.0 - 17.0 g/dL 9.0(L) 8.8(L) 8.4(L)  Hematocrit 39.0 - 52.0 % 28.3(L) 26.0(L) 24.7(L)  Platelets 150 - 400 K/uL 494(H) - 419(H)    ABG    Component Value Date/Time   PHART 7.381 11/04/2015 0330   PCO2ART 52.9 (H) 11/04/2015 0330   PO2ART 65.5 (L) 11/04/2015 0330   HCO3 30.7 (H) 11/04/2015 0330   TCO2 31 11/05/2015  1803   ACIDBASEDEF 1.0 10/29/2015 1531   O2SAT 91.3 11/04/2015 0330    CBG (last 3)   Recent Labs  11/05/15 1925 11/05/15 2317 11/06/15 0416  GLUCAP 125* 111* 155*    Imaging: Dg Chest Port 1 View  Result Date: 11/05/2015 CLINICAL DATA:  Chest tube placement EXAM: PORTABLE CHEST 1 VIEW COMPARISON:  11/05/2015 chest radiograph performed 0736 hours FINDINGS: A second right-sided chest tube is seen from lateral approach with tip projecting up to the right posterior fourth rib level and side port seen and the posterior eighth rib level. Pre-existing right-sided chest tube is noted unchanged in position with side-port overlying the right sixth costovertebral juncture. Patient slightly rotated. Confluent opacity at the left lung base may represent compressive atelectasis and effusion. Mild vascular congestion is noted. No pneumothorax is apparent. Tracheostomy tube tip is seen above the carina at the level of the aortic arch. Gastric tube extends into the expected location of the stomach. IMPRESSION: Two right-sided chest tubes as above described, one projecting up to the posterior right fourth rib level from lateral to medial back child and the second paralleling the ribs between  the posterior right sixth and seventh ribs. Left retrocardiac opacity with obscuration of the left hemidiaphragm and costophrenic angle presumably from effusion and compressive atelectasis. Mild vascular congestion is noted. Electronically Signed   By: Ashley Royalty M.D.   On: 11/05/2015 14:45   Dg Chest Port 1 View  Result Date: 11/05/2015 CLINICAL DATA:  Endotracheal intubation EXAM: PORTABLE CHEST 1 VIEW COMPARISON:  Yesterday FINDINGS: Feeding tube at least reaches the stomach. Mediastinal drain, central line, and tracheostomy tube in stable position. Improving pulmonary edema. Layering pleural effusions greater on the left with presumed overlapping atelectasis. No pneumothorax. Stable cardiopericardial enlargement.  Status post CABG. IMPRESSION: 1. Improved pulmonary edema. 2. Effusions and atelectasis greater on the left. Electronically Signed   By: Monte Fantasia M.D.   On: 11/05/2015 08:46   Dg Chest Port 1 View  Result Date: 11/04/2015 CLINICAL DATA:  Acute respiratory failure. New tracheostomy. Recent CABG. EXAM: PORTABLE CHEST 1 VIEW COMPARISON:  11/04/2015 at 0544 hours FINDINGS: A new tracheostomy tube is present and overlies the airway. Enteric tube courses into the left upper abdomen. Sequelae of CABG are again identified. Right PICC terminates over the lower SVC. Right-sided chest tube remains in place. The cardiac silhouette remains enlarged. Pulmonary vascular congestion is unchanged. There is slightly improved aeration of the lung bases, with bibasilar opacities likely reflecting a combination of layering pleural effusions and atelectasis. No pneumothorax is identified. IMPRESSION: Slightly improved aeration of the lung bases. Persistent pulmonary vascular congestion, bibasilar atelectasis, and bilateral pleural effusions. Electronically Signed   By: Logan Bores M.D.   On: 11/04/2015 10:23    Studies:  [place] 10/26 moderate effusion rt with significant compressive atx, small eff to mod left  Cultures: 10/18 sputum>>NF 10/19 sputum>>>NF 10/18 bcx 2>> NGTD 10/24 bcx >> MRSE, contam? 10/27 BC>>>GPC>>>   Antibiotics: 10/17 levaquin >>>10/24 10/17 Vancomycin >> 10/18  10/25 vanc >>> 10/24 aztreonam>>>plan 5 days , pending pct and clinical assessment   Events: 10/17 Admit, cardiac cath, emergent CABG due to NSTEMI. 10/18 0600 PCCM consulted for refractory hypoxia 10/25 reintubated for increased WOB and hypoxia.  10/26 trached.  Lines/tubes: 10/17 ETT>> 10/17 CT's x 3>>removed  x1 10/25>>> 10/17 Lt rad a line>>10/23 10/17 Rt IJ PA cath>>out 10/26 trach (DF)>>>   Summary: 81 yo male presented with increasing dyspnea and a fib with RVR.  Found to have 99% Lt main CAD and had emergent  CABG 10/17.  Developed refractory hypoxia post-op and PCCM consulted.  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  Assessment/plan:  Acute hypoxic respiratory failure from HCAP and pulmonary edema. Hx of COPD. Upper airway edema - treated with steroid.  PFO- shunt? likely a factor Vocal cord edema R sided effusion. - peep to 5 -then if successful, wean ps 12, cpap 5, goal 4 -6 hours -allow chest tube to drain rt -even balance goals  NSTEMI s/p CABG. Cardiogenic shock. AFib RVR on amio. -lasix consider dc, see renal - continue ASA, lipitor, amio -wean dop off  AKI - improving.  Chronic kidney disease 2 >> baseline creatine 1.20. hypok  Hypernatremia - treated with free water.  hypoK  -add d5w k supp consider hold lasix x 24 hours till we correct na  Nutrition. Hx of GERD vomit - Tube feeds, hold -restart trickle 10 in 4 hours -film -protection gastric likely needs reglan to hold until tools better  Anemia after surgery and critical illness. hemococnetration  -coags  wnl -sub q hep if okay by surgery  DM type II controlled - SSI with levemir hold when npo ad reduce  Acute metabolic encephalopathy improving - RASS goal: 0 -fent off to int scheduled to avoid wd  ID Presumed hcap, some fevers, improved 10/25 BCX growing MRSE, picc infection concerns ? R/o cdiff, has diarrhea NEW on ABX extended course and abdo symptoms  See repeat BC P: Dc picc, dc dop to do this Pct re assuring Cont vanc Send cdiff assay , Pre test prob high Dc picc Line holiday  Ccm time 30 min   Lavon Paganini. Titus Mould, MD, [place] Pgr: [facility] 11/06/2015 8:03 AM

## 2015-11-06 NOTE — Progress Notes (Signed)
PHARMACY - PHYSICIAN COMMUNICATION CRITICAL VALUE ALERT - BLOOD CULTURE IDENTIFICATION (BCID)  Results for orders placed or performed during the hospital encounter of 10/25/15  Blood Culture ID Panel (Reflexed) (Collected: 11/02/2015 12:35 PM)  Result Value Ref Range   Enterococcus species NOT DETECTED NOT DETECTED   Vancomycin resistance NOT DETECTED NOT DETECTED   Listeria monocytogenes NOT DETECTED NOT DETECTED   Staphylococcus species DETECTED (A) NOT DETECTED   Staphylococcus aureus NOT DETECTED NOT DETECTED   Methicillin resistance DETECTED (A) NOT DETECTED   Streptococcus species NOT DETECTED NOT DETECTED   Streptococcus agalactiae NOT DETECTED NOT DETECTED   Streptococcus pneumoniae NOT DETECTED NOT DETECTED   Streptococcus pyogenes NOT DETECTED NOT DETECTED   Acinetobacter baumannii NOT DETECTED NOT DETECTED   Enterobacteriaceae species NOT DETECTED NOT DETECTED   Enterobacter cloacae complex NOT DETECTED NOT DETECTED   Escherichia coli NOT DETECTED NOT DETECTED   Klebsiella oxytoca NOT DETECTED NOT DETECTED   Klebsiella pneumoniae NOT DETECTED NOT DETECTED   Proteus species NOT DETECTED NOT DETECTED   Serratia marcescens NOT DETECTED NOT DETECTED   Carbapenem resistance NOT DETECTED NOT DETECTED   Haemophilus influenzae NOT DETECTED NOT DETECTED   Neisseria meningitidis NOT DETECTED NOT DETECTED   Pseudomonas aeruginosa NOT DETECTED NOT DETECTED   Candida albicans NOT DETECTED NOT DETECTED   Candida glabrata NOT DETECTED NOT DETECTED   Candida krusei NOT DETECTED NOT DETECTED   Candida parapsilosis NOT DETECTED NOT DETECTED   Candida tropicalis NOT DETECTED NOT DETECTED    Name of physician (or Provider) Contacted:   N/A--repeat of prior blood cultures 10/24  Changes to prescribed antibiotics required:   None needed--on Vancomycin   Caryl Pina 11/06/2015  7:31 AM

## 2015-11-07 ENCOUNTER — Inpatient Hospital Stay (HOSPITAL_COMMUNITY): Payer: Medicare Other

## 2015-11-07 DIAGNOSIS — Z930 Tracheostomy status: Secondary | ICD-10-CM

## 2015-11-07 DIAGNOSIS — G9340 Encephalopathy, unspecified: Secondary | ICD-10-CM

## 2015-11-07 LAB — GLUCOSE, CAPILLARY
Glucose-Capillary: 139 mg/dL — ABNORMAL HIGH (ref 65–99)
Glucose-Capillary: 128 mg/dL — ABNORMAL HIGH (ref 65–99)
Glucose-Capillary: 143 mg/dL — ABNORMAL HIGH (ref 65–99)
Glucose-Capillary: 147 mg/dL — ABNORMAL HIGH (ref 65–99)
Glucose-Capillary: 137 mg/dL — ABNORMAL HIGH (ref 65–99)
Glucose-Capillary: 138 mg/dL — ABNORMAL HIGH (ref 65–99)

## 2015-11-07 LAB — CULTURE, BLOOD (ROUTINE X 2): CULTURE: NO GROWTH

## 2015-11-07 LAB — BASIC METABOLIC PANEL
Anion gap: 7 (ref 5–15)
BUN: 87 mg/dL — ABNORMAL HIGH (ref 6–20)
CO2: 30 mmol/L (ref 22–32)
Calcium: 8.2 mg/dL — ABNORMAL LOW (ref 8.9–10.3)
Chloride: 118 mmol/L — ABNORMAL HIGH (ref 101–111)
Creatinine, Ser: 1.32 mg/dL — ABNORMAL HIGH (ref 0.61–1.24)
GFR calc Af Amer: 58 mL/min — ABNORMAL LOW (ref >60)
GFR calc non Af Amer: 50 mL/min — ABNORMAL LOW (ref >60)
Glucose, Bld: 156 mg/dL — ABNORMAL HIGH (ref 65–99)
Potassium: 3.8 mmol/L (ref 3.5–5.1)
Sodium: 155 mmol/L — ABNORMAL HIGH (ref 135–145)

## 2015-11-07 LAB — CBC
HCT: 30.5 % — ABNORMAL LOW (ref 39.0–52.0)
Hemoglobin: 9.1 g/dL — ABNORMAL LOW (ref 13.0–17.0)
MCH: 29.1 pg (ref 26.0–34.0)
MCHC: 29.8 g/dL — ABNORMAL LOW (ref 30.0–36.0)
MCV: 97.4 fL (ref 78.0–100.0)
Platelets: 497 10*3/uL — ABNORMAL HIGH (ref 150–400)
RBC: 3.13 MIL/uL — ABNORMAL LOW (ref 4.22–5.81)
RDW: 16.6 % — ABNORMAL HIGH (ref 11.5–15.5)
WBC: 15.3 10*3/uL — ABNORMAL HIGH (ref 4.0–10.5)

## 2015-11-07 LAB — PROCALCITONIN: Procalcitonin: 0.14 ng/mL

## 2015-11-07 MED ORDER — FAMOTIDINE IN NACL 20-0.9 MG/50ML-% IV SOLN
20.0000 mg | Freq: Two times a day (BID) | INTRAVENOUS | Status: DC
  Administered 2015-11-07 – 2015-11-10 (×8): 20 mg via INTRAVENOUS
  Filled 2015-11-07 (×8): qty 50

## 2015-11-07 NOTE — Progress Notes (Signed)
Chest tube removed per order. Patient tolerated procedure well. No adverse events noted. Will cont to monitor.

## 2015-11-07 NOTE — Progress Notes (Signed)
Patient ID: Mark Rivers, male   DOB: 04-08-1934, 81 y.o.   MRN: [id number] TCTS DAILY ICU PROGRESS NOTE                   [address]            [place] [address]          [phone]   12 Days Post-Op Procedure(s) (LRB): CORONARY ARTERY BYPASS GRAFTING (CABG) x3(LIMA to LA, SVG to OM1, SVG to PDA) with EVH from the left thigh and partial lower leg greater saphenous vein and left internal mammary artery (N/A) TRANSESOPHAGEAL ECHOCARDIOGRAM (TEE) (N/A) INTRA-AORTIC BALLOON PUMP INSERTION size 40cc (Left)  Total Length of Stay:  LOS: 13 days   Subjective: Awake and alert, not complaining of abdominal pain , remains on vent   Objective: Vital signs in last 24 hours: Temp:  [98.2 F (36.8 C)-99.1 F (37.3 C)] 98.6 F (37 C) (10/29 0754) Pulse Rate:  [74-113] 94 (10/29 0700) Cardiac Rhythm: Atrial fibrillation;Ventricular paced (10/29 0400) Resp:  [10-23] 18 (10/29 0700) BP: (117-155)/(51-93) 155/84 (10/29 0700) SpO2:  [92 %-100 %] 100 % (10/29 0700) FiO2 (%):  [40 %-50 %] 50 % (10/29 0400) Weight:  [224 lb 6.9 oz (101.8 kg)] 224 lb 6.9 oz (101.8 kg) (10/29 0400)  Filed Weights   11/05/15 0500 11/06/15 0430 11/07/15 0400  Weight: 235 lb 14.3 oz (107 kg) 229 lb 15 oz (104.3 kg) 224 lb 6.9 oz (101.8 kg)    Weight change: -5 lb 8.2 oz (-2.5 kg)   Hemodynamic parameters for last 24 hours:    Intake/Output from previous day: 10/28 0701 - 10/29 0700 In: 2392.5 [I.V.:2003.7; NG/GT:208.8; IV Piggyback:150] Out: 4250 [Urine:3980; Stool:250; Chest Tube:20]  Intake/Output this shift: No intake/output data recorded.  Current Meds: Scheduled Meds: . amiodarone  200 mg Oral BID  . aspirin EC  325 mg Oral Daily   Or  . aspirin  324 mg Per Tube Daily  . atorvastatin  80 mg Oral q1800  . aztreonam  2 g Intravenous Q8H  . budesonide (PULMICORT) nebulizer solution  0.5 mg Nebulization BID  . chlorhexidine gluconate (MEDLINE KIT)  15 mL Mouth Rinse BID  .  enoxaparin (LOVENOX) injection  30 mg Subcutaneous Q24H  . etomidate  40 mg Intravenous Once  . feeding supplement (PRO-STAT SUGAR FREE 64)  60 mL Per Tube TID  . fentaNYL (SUBLIMAZE) injection  50 mcg Intravenous Q4H  . furosemide  40 mg Intravenous Daily  . hydrocortisone   Topical BID  . insulin aspart  0-24 Units Subcutaneous Q4H  . levalbuterol  0.63 mg Nebulization TID  . mouth rinse  15 mL Mouth Rinse 10 times per day  . metoprolol tartrate  12.5 mg Per Tube BID   Or  . metoprolol tartrate  12.5 mg Oral BID  . neomycin-bacitracin-polymyxin   Topical BID  . pantoprazole sodium  40 mg Per Tube Q24H  . potassium chloride  20 mEq Oral Daily  . sodium chloride flush  10-40 mL Intracatheter Q12H  . sodium chloride flush  3 mL Intravenous Q12H  . vancomycin  1,250 mg Intravenous Q24H  . vecuronium  10 mg Intravenous Once   Continuous Infusions: . dextrose 100 mL/hr at 11/07/15 0700  . feeding supplement (VITAL HIGH PROTEIN) Stopped (11/06/15 1700)  . fentaNYL infusion INTRAVENOUS Stopped (11/05/15 1700)   PRN Meds:.acetaminophen, azelastine, fentaNYL (SUBLIMAZE) injection, Kaylee Wombles's butt cream, levalbuterol, metoprolol, morphine injection, ondansetron (ZOFRAN) IV, potassium chloride,  sodium chloride flush, sodium chloride flush, traZODone  General appearance: alert and cooperative Neurologic: intact Heart: irregularly irregular rhythm Lungs: diminished breath sounds bibasilar Abdomen: abdomen not tender has bowel sounds, no tenderness over GB  Extremities: extremities normal, atraumatic, no cyanosis or edema and Homans sign is negative, no sign of DVT Wound: sternum stable   Lab Results: CBC: Recent Labs  11/05/15 0435 11/05/15 1803 11/06/15 0415  WBC 13.5*  --  15.3*  HGB 8.4* 8.8* 9.0*  HCT 24.7* 26.0* 28.3*  PLT 419*  --  494*   BMET:  Recent Labs  11/06/15 0415 11/06/15 1528  NA 154* 156*  K 2.9* 4.1  CL 116* 120*  CO2 31 29  GLUCOSE 172* 190*  BUN 114*  106*  CREATININE 1.39* 1.42*  CALCIUM 7.9* 8.2*    CMET: Lab Results  Component Value Date   WBC 15.3 (H) 11/06/2015   HGB 9.0 (L) 11/06/2015   HCT 28.3 (L) 11/06/2015   PLT 494 (H) 11/06/2015   GLUCOSE 190 (H) 11/06/2015   TRIG 268 (H) 11/05/2015   ALT 28 11/06/2015   AST 40 11/06/2015   NA 156 (H) 11/06/2015   K 4.1 11/06/2015   CL 120 (H) 11/06/2015   CREATININE 1.42 (H) 11/06/2015   BUN 106 (H) 11/06/2015   CO2 29 11/06/2015   TSH 3.494 10/26/2015   INR 1.35 11/03/2015    PT/INR: No results for input(s): LABPROT, INR in the last 72 hours. Radiology: No results found.   Assessment/Plan: S/P Procedure(s) (LRB): CORONARY ARTERY BYPASS GRAFTING (CABG) x3(LIMA to LA, SVG to OM1, SVG to PDA) with EVH from the left thigh and partial lower leg greater saphenous vein and left internal mammary artery (N/A) TRANSESOPHAGEAL ECHOCARDIOGRAM (TEE) (N/A) INTRA-AORTIC BALLOON PUMP INSERTION size 40cc (Left) Continue weaning plan per ccm pic line removed yesterday,  Not tolerating tube feeding currently held  Feeding and electrolyte management per ccm and renal  k corrected still hypernatremic  Mild leucocytosis  Not much from chest tube , d/c today      Grace Isaac 11/07/2015 7:56 AM

## 2015-11-07 NOTE — Progress Notes (Signed)
PCCM PROGRESS NOTE   Admission date:  10/25/2015 Consult date:  10/27/2015 Referring provider:  Dr. Prescott Gum  CC:  Short of breath  Subjective:  Awake and alert on vent, follows commands  Chest tubed removed today  Vomited started 10/28 and again this am. TF on hold  Diarrhea persist, C Diff neg. KUB no obstruction  Unable to wean yest or today with increased wob    Vital signs: BP (!) 152/72 (BP Location: Left Arm)   Pulse 92   Temp 98.6 F (37 C) (Oral)   Resp 16   Ht 5\' 6"  (1.676 m)   Wt 224 lb 6.9 oz (101.8 kg)   SpO2 97%   BMI 36.22 kg/m    Intake/output: I/O last 3 completed shifts: In: 3780.8 [I.V.:2302; Other:30; [id number]; IV Piggyback:300] Out: 6200 [Urine:5680; Stool:250; Chest Tube:270]  Hemodynamics:    Vent settings: Vent Mode: PCV FiO2 (%):  [40 %-50 %] 40 % Set Rate:  [16 bmp] 16 bmp PEEP:  [5 cmH20] 5 cmH20 Plateau Pressure:  [13 cmH20-16 cmH20] 13 cmH20   General: more awake Neuro: RASS 0 HEENT: trach clean , obese neck Cardiac: s1 s2 IRR Chest: coarse BS improved rt base  Abd: soft, present BS, no r/g Ext: 2+ edema Skin: no rashes  LABS: CMP Latest Ref Rng & Units 11/07/2015 11/06/2015 11/06/2015  Glucose 65 - 99 mg/dL 156(H) 190(H) 172(H)  BUN 6 - 20 mg/dL 87(H) 106(H) 114(H)  Creatinine 0.61 - 1.24 mg/dL 1.32(H) 1.42(H) 1.39(H)  Sodium 135 - 145 mmol/L 155(H) 156(H) 154(H)  Potassium 3.5 - 5.1 mmol/L 3.8 4.1 2.9(L)  Chloride 101 - 111 mmol/L 118(H) 120(H) 116(H)  CO2 22 - 32 mmol/L 30 29 31   Calcium 8.9 - 10.3 mg/dL 8.2(L) 8.2(L) 7.9(L)  Total Protein 6.5 - 8.1 g/dL - - 5.6(L)  Total Bilirubin 0.3 - 1.2 mg/dL - - 0.7  Alkaline Phos 38 - 126 U/L - - 69  AST 15 - 41 U/L - - 40  ALT 17 - 63 U/L - - 28    CBC Latest Ref Rng & Units 11/07/2015 11/06/2015 11/05/2015  WBC 4.0 - 10.5 K/uL 15.3(H) 15.3(H) -  Hemoglobin 13.0 - 17.0 g/dL 9.1(L) 9.0(L) 8.8(L)  Hematocrit 39.0 - 52.0 % 30.5(L) 28.3(L) 26.0(L)  Platelets 150 - 400  K/uL 497(H) 494(H) -    ABG    Component Value Date/Time   PHART 7.381 11/04/2015 0330   PCO2ART 52.9 (H) 11/04/2015 0330   PO2ART 65.5 (L) 11/04/2015 0330   HCO3 30.7 (H) 11/04/2015 0330   TCO2 31 11/05/2015 1803   ACIDBASEDEF 1.0 10/29/2015 1531   O2SAT 91.3 11/04/2015 0330    CBG (last 3)   Recent Labs  11/06/15 2342 11/07/15 0345 11/07/15 0750  GLUCAP 148* 139* 128*    Imaging: Dg Chest Port 1 View  Result Date: 11/07/2015 CLINICAL DATA:  Follow-up chest tube and tracheostomy tube. EXAM: PORTABLE CHEST 1 VIEW COMPARISON:  Yesterday. FINDINGS: Stable right chest tube. Possible tiny right apical pneumothorax. The tracheostomy tube is in satisfactory position. Feeding tube extending into the stomach. Stable enlarged cardiac silhouette and post CABG changes. No significant change in left lower lobe airspace opacity. A small left pleural effusion is currently demonstrated. Unremarkable bones. IMPRESSION: 1. Stable right chest to with a possible tiny right apical pneumothorax currently demonstrated. 2. No significant change in left lower lobe atelectasis or pneumonia. 3. Small left pleural effusion. Electronically Signed   By: Percell Locus.D.  On: 11/07/2015 08:02   Dg Chest Port 1 View  Result Date: 11/06/2015 CLINICAL DATA:  Chest tube placement EXAM: PORTABLE CHEST 1 VIEW COMPARISON:  11/05/2015 FINDINGS: Right anterior chest tube removed. Posterior chest tube remains in good position. No pneumothorax. Right sided central venous catheter tip not well seen but appears to be in the SVC unchanged Left lower lobe consolidation unchanged. Mild improvement in right lower lobe atelectasis. Tracheostomy in good position. IMPRESSION: One of 2 right chest tubes removed.  No pneumothorax Left lower lobe consolidation unchanged. Improved aeration in the right lung base. Electronically Signed   By: Franchot Gallo M.D.   On: 11/06/2015 08:58   Dg Chest Port 1 View  Result Date:  11/05/2015 CLINICAL DATA:  Chest tube placement EXAM: PORTABLE CHEST 1 VIEW COMPARISON:  11/05/2015 chest radiograph performed 0736 hours FINDINGS: A second right-sided chest tube is seen from lateral approach with tip projecting up to the right posterior fourth rib level and side port seen and the posterior eighth rib level. Pre-existing right-sided chest tube is noted unchanged in position with side-port overlying the right sixth costovertebral juncture. Patient slightly rotated. Confluent opacity at the left lung base may represent compressive atelectasis and effusion. Mild vascular congestion is noted. No pneumothorax is apparent. Tracheostomy tube tip is seen above the carina at the level of the aortic arch. Gastric tube extends into the expected location of the stomach. IMPRESSION: Two right-sided chest tubes as above described, one projecting up to the posterior right fourth rib level from lateral to medial back child and the second paralleling the ribs between the posterior right sixth and seventh ribs. Left retrocardiac opacity with obscuration of the left hemidiaphragm and costophrenic angle presumably from effusion and compressive atelectasis. Mild vascular congestion is noted. Electronically Signed   By: Ashley Royalty M.D.   On: 11/05/2015 14:45   Dg Abd Portable 1v  Result Date: 11/06/2015 CLINICAL DATA:  Vomiting.  Tube placement EXAM: PORTABLE ABDOMEN - 1 VIEW COMPARISON:  10/29/2015 FINDINGS: Feeding tube passes through the stomach into the duodenum. Feeding tube tip in the fourth portion of duodenum near the ligament of Treitz. Nonobstructive bowel gas pattern. IMPRESSION: Feeding tube tip near the ligament of Treitz in the duodenum. Electronically Signed   By: Franchot Gallo M.D.   On: 11/06/2015 08:58    Studies:  [place] 10/26 moderate effusion rt with significant compressive atx, small eff to mod left  Cultures: 10/18 sputum>>NF 10/19 sputum>>>NF 10/18 bcx 2>> NGTD 10/24 bcx >> MRSE,  contam? 10/27 BC>>>GPC>>> 10/28 C Diff >neg   Antibiotics: 10/17 levaquin >>>10/24 10/17 Vancomycin >> 10/18  10/25 vanc >>> 10/24 aztreonam>>>plan 5 days , pending pct and clinical assessment   Events: 10/17 Admit, cardiac cath, emergent CABG due to NSTEMI. 10/18 0600 PCCM consulted for refractory hypoxia 10/25 reintubated for increased WOB and hypoxia.  10/26 trached.  Lines/tubes: 10/17 ETT>> 10/17 CT's x 3>>removed  x1 10/25>>> 10/17 Lt rad a line>>10/23 10/17 Rt IJ PA cath>>out 10/26 trach (DF)>>>   Summary: 81 yo male presented with increasing dyspnea and a fib with RVR.  Found to have 99% Lt main CAD and had emergent CABG 10/17.  Developed refractory hypoxia post-op and PCCM consulted.  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  Assessment/plan:  Acute hypoxic respiratory failure from HCAP and pulmonary edema. S/p Trach 10/26 Hx of COPD. Upper airway edema - treated with steroid.  PFO- shunt? likely  a factor Vocal cord edema R sided effusion.-last chest tube removed 10/29  Plan  - cont to wean as able  -even balance goals  NSTEMI s/p CABG 10/17  Cardiogenic shock. AFib RVR on amio. Diastolic CHF -echo [id number] EF 55%, Gr 2 DD  -lasix on hold 10/29 , see renal - continue ASA, lipitor, amio   AKI - improving.  Chronic kidney disease 2 >> baseline creatine 1.20. hypok >resolved  Hypernatremia - na tr up   Plan  -cont d5w - 100cc/hr  k supp Hold lasix x 24 hours till we correct na Free water on hold d/t vomit   Nutrition. Hx of GERD Vomit Diarrhea - C diff neg , KUB neg for obstruction  - Tube feeds, hold -protection gastric -change PPI to pepcid  May need reglan -  Anemia after surgery and critical illness. hemococnetration  -coags wnl -sub q hep if okay by surgery  DM type II controlled - SSI with levemir hold when npo ad reduce  Acute metabolic encephalopathy improving -  RASS goal: 0 -fent off to int scheduled to avoid wd  ID Presumed hcap, some fevers, improved 10/25 BCX growing MRSE, picc infection concerns ? R/o cdiff, has diarrhea NEW on ABX extended course and abdo symptoms  See repeat BC P: Dc picc, dc dop to do this Pct re assuring Cont vanc Send cdiff assay , Pre test prob high Dc picc Line holiday   Tammy Parrett NP-C  [facility]  [phone]   11/07/2015 10:32 AM  Attending Note:  81 year old male presenting as a NSTEMI followed by a CABG (10/18).  Failed extubation and eventually trached on 10/26.  Vomiting overnight and failed weaning.  On exam, lung sounds are coarse.  I reviewed CXR myself, trach in good place.  Maintain NPO today, attempt weaning again in AM.  Changed from free water to D5W at 100 ml/hour for hypernatremia (free water flushes were ineffective).  Continue abx and d/c after today.  C diff negative 10/28.  PRN sedation only.  Need to determine need for PEG this week and placement.  The patient is critically ill with multiple organ systems failure and requires high complexity decision making for assessment and support, frequent evaluation and titration of therapies, application of advanced monitoring technologies and extensive interpretation of multiple databases.   Critical Care Time devoted to patient care services described in this note is  35  Minutes. This time reflects time of care of this signee Dr [name]. This critical care time does not reflect procedure time, or teaching time or supervisory time of PA/NP/Med student/Med Resident etc but could involve care discussion time.  Rush Farmer, M.D. [facility]monary/Critical Care Medicine. Pager: [phone]. After hours pager: [phone].

## 2015-11-07 NOTE — Progress Notes (Signed)
Patient ID: Mark Rivers, male   DOB: 1934/09/10, 81 y.o.   MRN: [id number] EVENING ROUNDS NOTE :     [address]       [place] [address]             [phone]                 12 Days Post-Op Procedure(s) (LRB): CORONARY ARTERY BYPASS GRAFTING (CABG) x3(LIMA to LA, SVG to OM1, SVG to PDA) with EVH from the left thigh and partial lower leg greater saphenous vein and left internal mammary artery (N/A) TRANSESOPHAGEAL ECHOCARDIOGRAM (TEE) (N/A) INTRA-AORTIC BALLOON PUMP INSERTION size 40cc (Left)  Total Length of Stay:  LOS: 13 days  BP (!) 163/88 (BP Location: Right Arm)   Pulse 87   Temp 99.1 F (37.3 C) (Oral)   Resp 16   Ht 5\' 6"  (1.676 m)   Wt 224 lb 6.9 oz (101.8 kg)   SpO2 100%   BMI 36.22 kg/m   .Intake/Output      10/28 0701 - 10/29 0700 10/29 0701 - 10/30 0700   P.O. 0    I.V. (mL/kg) 2003.7 (19.7) 1092.9 (10.7)   Other 30    NG/GT 208.8 20   IV Piggyback 150 100   Total Intake(mL/kg) 2392.5 (23.5) 1212.9 (11.9)   Urine (mL/kg/hr) 3980 (1.6) 1950 (1.6)   Emesis/NG output 0 (0) 0 (0)   Stool 250 (0.1) 0 (0)   Chest Tube 20 (0) 20 (0)   Total Output 4250 1970   Net -1857.5 -757.1        Stool Occurrence 3 x 1 x   Emesis Occurrence 2 x 1 x     . dextrose 100 mL/hr at 11/07/15 1800  . feeding supplement (VITAL HIGH PROTEIN) Stopped (11/06/15 1700)  . fentaNYL infusion INTRAVENOUS Stopped (11/05/15 1700)     Lab Results  Component Value Date   WBC 15.3 (H) 11/07/2015   HGB 9.1 (L) 11/07/2015   HCT 30.5 (L) 11/07/2015   PLT 497 (H) 11/07/2015   GLUCOSE 156 (H) 11/07/2015   TRIG 268 (H) 11/05/2015   ALT 28 11/06/2015   AST 40 11/06/2015   NA 155 (H) 11/07/2015   K 3.8 11/07/2015   CL 118 (H) 11/07/2015   CREATININE 1.32 (H) 11/07/2015   BUN 87 (H) 11/07/2015   CO2 30 11/07/2015   TSH 3.494 10/26/2015   INR 1.35 11/03/2015   Did  not tolerate weaning vent today  Tube feeding being held   [name]  Beeper  [phone] Office [phone] 11/07/2015 6:45 PM

## 2015-11-08 ENCOUNTER — Inpatient Hospital Stay (HOSPITAL_COMMUNITY): Payer: Medicare Other

## 2015-11-08 DIAGNOSIS — A419 Sepsis, unspecified organism: Secondary | ICD-10-CM

## 2015-11-08 DIAGNOSIS — E878 Other disorders of electrolyte and fluid balance, not elsewhere classified: Secondary | ICD-10-CM

## 2015-11-08 DIAGNOSIS — E870 Hyperosmolality and hypernatremia: Secondary | ICD-10-CM

## 2015-11-08 LAB — GLUCOSE, CAPILLARY
Glucose-Capillary: 91 mg/dL (ref 65–99)
Glucose-Capillary: 132 mg/dL — ABNORMAL HIGH (ref 65–99)
Glucose-Capillary: 123 mg/dL — ABNORMAL HIGH (ref 65–99)
Glucose-Capillary: 139 mg/dL — ABNORMAL HIGH (ref 65–99)
Glucose-Capillary: 121 mg/dL — ABNORMAL HIGH (ref 65–99)

## 2015-11-08 LAB — COMPREHENSIVE METABOLIC PANEL
ALT: 26 U/L (ref 17–63)
AST: 33 U/L (ref 15–41)
Albumin: 2.3 g/dL — ABNORMAL LOW (ref 3.5–5.0)
Alkaline Phosphatase: 67 U/L (ref 38–126)
Anion gap: 5 (ref 5–15)
BUN: 68 mg/dL — ABNORMAL HIGH (ref 6–20)
CO2: 33 mmol/L — ABNORMAL HIGH (ref 22–32)
Calcium: 8.5 mg/dL — ABNORMAL LOW (ref 8.9–10.3)
Chloride: 119 mmol/L — ABNORMAL HIGH (ref 101–111)
Creatinine, Ser: 1.3 mg/dL — ABNORMAL HIGH (ref 0.61–1.24)
GFR calc Af Amer: 59 mL/min — ABNORMAL LOW (ref >60)
GFR calc non Af Amer: 51 mL/min — ABNORMAL LOW (ref >60)
Glucose, Bld: 106 mg/dL — ABNORMAL HIGH (ref 65–99)
Potassium: 3.8 mmol/L (ref 3.5–5.1)
Sodium: 157 mmol/L — ABNORMAL HIGH (ref 135–145)
Total Bilirubin: 0.7 mg/dL (ref 0.3–1.2)
Total Protein: 6 g/dL — ABNORMAL LOW (ref 6.5–8.1)

## 2015-11-08 LAB — CBC
HCT: 30 % — ABNORMAL LOW (ref 39.0–52.0)
Hemoglobin: 8.9 g/dL — ABNORMAL LOW (ref 13.0–17.0)
MCH: 29.3 pg (ref 26.0–34.0)
MCHC: 29.7 g/dL — ABNORMAL LOW (ref 30.0–36.0)
MCV: 98.7 fL (ref 78.0–100.0)
Platelets: 507 10*3/uL — ABNORMAL HIGH (ref 150–400)
RBC: 3.04 MIL/uL — ABNORMAL LOW (ref 4.22–5.81)
RDW: 16.4 % — ABNORMAL HIGH (ref 11.5–15.5)
WBC: 19.7 10*3/uL — ABNORMAL HIGH (ref 4.0–10.5)

## 2015-11-08 LAB — CULTURE, BLOOD (ROUTINE X 2)

## 2015-11-08 MED ORDER — AMIODARONE HCL IN DEXTROSE 360-4.14 MG/200ML-% IV SOLN
30.0000 mg/h | INTRAVENOUS | Status: DC
  Administered 2015-11-08 – 2015-11-09 (×2): 30 mg/h via INTRAVENOUS
  Filled 2015-11-08 (×2): qty 200

## 2015-11-08 MED ORDER — SODIUM CHLORIDE 0.9% FLUSH
10.0000 mL | INTRAVENOUS | Status: DC | PRN
  Administered 2015-11-24: 40 mL
  Filled 2015-11-08: qty 40

## 2015-11-08 MED ORDER — FREE WATER
200.0000 mL | Freq: Three times a day (TID) | Status: DC
  Administered 2015-11-08 – 2015-11-10 (×7): 200 mL

## 2015-11-08 MED ORDER — SODIUM CHLORIDE 0.9% FLUSH
10.0000 mL | Freq: Two times a day (BID) | INTRAVENOUS | Status: DC
  Administered 2015-11-09 – 2015-11-18 (×10): 10 mL
  Administered 2015-11-18: 20 mL
  Administered 2015-11-19 – 2015-11-23 (×7): 10 mL
  Administered 2015-11-23 – 2015-11-24 (×2): 20 mL
  Administered 2015-11-25 – 2015-11-26 (×3): 10 mL

## 2015-11-08 MED ORDER — VANCOMYCIN HCL IN DEXTROSE 1-5 GM/200ML-% IV SOLN: 1000.0000 mg | INTRAVENOUS | Status: DC

## 2015-11-08 MED ORDER — PRO-STAT SUGAR FREE PO LIQD
60.0000 mL | Freq: Four times a day (QID) | ORAL | Status: DC
  Administered 2015-11-08 – 2015-11-15 (×27): 60 mL
  Filled 2015-11-08 (×23): qty 60

## 2015-11-08 MED ORDER — JEVITY 1.2 CAL PO LIQD
1000.0000 mL | ORAL | Status: DC
  Administered 2015-11-08: 1000 mL
  Administered 2015-11-09: 18:00:00
  Filled 2015-11-08 (×2): qty 1000

## 2015-11-08 MED ORDER — METOCLOPRAMIDE HCL 5 MG/ML IJ SOLN
10.0000 mg | Freq: Four times a day (QID) | INTRAMUSCULAR | Status: DC
  Administered 2015-11-08 – 2015-11-10 (×11): 10 mg via INTRAVENOUS
  Filled 2015-11-08 (×10): qty 2

## 2015-11-08 NOTE — Progress Notes (Signed)
Patient ID: Mark Rivers, male   DOB: 1934-05-23, 81 y.o.   MRN: [id number]  SICU Evening Rounds:  Hemodynamically stable.  On IV amio for atrial fib  Remains on vent. sats 97% on 40% FiO2  Urine output ok

## 2015-11-08 NOTE — Progress Notes (Addendum)
Nutrition Follow Up  DOCUMENTATION CODES:   Obesity unspecified  INTERVENTION:    Continue Jevity 1.2 formula at goal rate of 20 ml/hr   Prostat liquid protein 60 ml QID via tube   Total TF regimen to provide 1376 kcals, 146 gm protein, 387 ml of free water  NUTRITION DIAGNOSIS:   Inadequate oral intake related to inability to eat as evidenced by NPO status, ongoing  GOAL:   Provide needs based on ASPEN/SCCM guidelines, progressing  MONITOR:   TF tolerance, Vent status, Labs, Weight trends, Skin, I & O's  ASSESSMENT:   81 yo WM with PMH significant for but not limited to, CAD with RCA stent 1988, IDDM,HTN, GERD; who has had increasing DOE for 3 weeks prior to admit. 10/16 he presented with acute chest pain, increased SOB and new Afib with RVR. CC revealed 99% Left main and he was taken urgently to CABGx 3 per CVTS 10/17 1730 and presented to SICU 2230.   Patient s/p procedure 10/16: CORONARY ARTERY BYPASS GRAFTING (CABG) x 3  INTRA-AORTIC BALLOON PUMP INSERTION  Patient is currently on ventilator support >> trach Temp (24hrs), Avg:98.7 F (37.1 C), Min:98.1 F (36.7 C), Max:99.1 F (37.3 C)  Propofol discontinued   Pt presented 10/17 with NSTEMI >> required urgent surgery. Developed severe hypoxia in AM 10/18. Per RN pt with vomiting episodes >> Vital High Protein D/C'd Jevity 1.2 formula ordered and currently infusing at 20 ml/hr via CORTRAK feeding tube (tip in duodenum).  Free water flushes at 200 ml every 8 hours.  10/20: Verbal with readback order received per Dr. Prescott Gum to manage TF  Diet Order:  Diet NPO time specified   CBG (last 3)   Recent Labs  11/08/15 0309 11/08/15 0751 11/08/15 1143  GLUCAP 91 132* 123*   Skin:  Reviewed, no issues  Last BM:  10/29  Height:   Ht Readings from Last 1 Encounters:  10/27/15 5\' 6"  (1.676 m)    Weight:   Wt Readings from Last 1 Encounters:  11/08/15 221 lb 5.5 oz (100.4 kg)    Ideal Body  Weight:  59 kg  BMI:  Body mass index is 35.73 kg/m.  Estimated Nutritional Needs:   Kcal:  [id number]  Protein:  130-140 gm  Fluid:  per MD  EDUCATION NEEDS:   No education needs identified at this time  Mark Rivers, RD, LDN Pager #: [phone] After-Hours Pager #: [phone]

## 2015-11-08 NOTE — Progress Notes (Signed)
PCCM PROGRESS NOTE   Admission date:  10/25/2015 Consult date:  10/27/2015 Referring provider:  Dr. Prescott Gum  CC:  Short of breath  Subjective: Patient had some nausea with tube feedings overnight with emesis. Patient tried to wean on ventilator but was having apnea. Patient notes no to any pain or difficulty breathing. Currently transitioned over to tracheostomy collar.  Review of Systems:  Unable to obtain with patient on tracheostomy & ventilator.   Vital signs: BP (!) 166/63   Pulse 68   Temp 98.3 F (36.8 C) (Oral)   Resp 15   Ht 5\' 6"  (1.676 m)   Wt 221 lb 5.5 oz (100.4 kg)   SpO2 96%   BMI 35.73 kg/m    Intake/output: I/O last 3 completed shifts: In: 4977.9 [I.V.:3577.9; Other:30; NG/GT:170; IV Piggyback:1200] Out: [id number] [Urine:4875; Chest Tube:20]  Hemodynamics:    Vent settings: Vent Mode: PCV FiO2 (%):  [40 %] 40 % Set Rate:  [16 bmp] 16 bmp PEEP:  [5 cmH20] 5 cmH20 Plateau Pressure:  [13 cmH20-17 cmH20] 16 cmH20  Physcial Exam: General:  Awake. No acute distress. No family at bedside.  Integument:  Warm & dry. No rash on exposed skin.  HEENT: NG tube within right nare. No scleral injection or icterus. Tracheostomy in place. Cardiovascular: Irregularly irregular rhythm. Mild anasarca. No appreciable JVD. Pulmonary:  Slightly diminished breath sounds in the bases. Normal work of breathing on tracheostomy collar. Otherwise clear to auscultation. Abdomen: Soft. Hyperactive bowel sounds. Protuberant. Seemingly nontender. Musculoskeletal:  No joint deformity or effusion appreciated. Neurological:  Nods appropriately & wiggles toes on command. Grossly nonfocal.  LABS: CMP Latest Ref Rng & Units 11/08/2015 11/07/2015 11/06/2015  Glucose 65 - 99 mg/dL 106(H) 156(H) 190(H)  BUN 6 - 20 mg/dL 68(H) 87(H) 106(H)  Creatinine 0.61 - 1.24 mg/dL 1.30(H) 1.32(H) 1.42(H)  Sodium 135 - 145 mmol/L 157(H) 155(H) 156(H)  Potassium 3.5 - 5.1 mmol/L 3.8 3.8 4.1  Chloride 101  - 111 mmol/L 119(H) 118(H) 120(H)  CO2 22 - 32 mmol/L 33(H) 30 29  Calcium 8.9 - 10.3 mg/dL 8.5(L) 8.2(L) 8.2(L)  Total Protein 6.5 - 8.1 g/dL 6.0(L) - -  Total Bilirubin 0.3 - 1.2 mg/dL 0.7 - -  Alkaline Phos 38 - 126 U/L 67 - -  AST 15 - 41 U/L 33 - -  ALT 17 - 63 U/L 26 - -    CBC Latest Ref Rng & Units 11/08/2015 11/07/2015 11/06/2015  WBC 4.0 - 10.5 K/uL 19.7(H) 15.3(H) 15.3(H)  Hemoglobin 13.0 - 17.0 g/dL 8.9(L) 9.1(L) 9.0(L)  Hematocrit 39.0 - 52.0 % 30.0(L) 30.5(L) 28.3(L)  Platelets 150 - 400 K/uL 507(H) 497(H) 494(H)    ABG    Component Value Date/Time   PHART 7.381 11/04/2015 0330   PCO2ART 52.9 (H) 11/04/2015 0330   PO2ART 65.5 (L) 11/04/2015 0330   HCO3 30.7 (H) 11/04/2015 0330   TCO2 31 11/05/2015 1803   ACIDBASEDEF 1.0 10/29/2015 1531   O2SAT 91.3 11/04/2015 0330    CBG (last 3)   Recent Labs  11/07/15 2305 11/08/15 0309 11/08/15 0751  GLUCAP 138* 91 132*    Imaging: Dg Chest Port 1 View  Result Date: 11/08/2015 CLINICAL DATA:  Respiratory failure, tracheostomy patient. History of coronary artery disease, and CABG 13 days ago. EXAM: PORTABLE CHEST 1 VIEW COMPARISON:  Portable chest x-ray of November 07, 2015 FINDINGS: There is been interval removal of the right-sided chest tube. No pneumothorax is evident today. The lungs remain hyperinflated. There  is persistent left lower lobe atelectasis or pneumonia. There is small left pleural effusion which has decreased slightly. The pulmonary interstitial markings are more conspicuous overall today. The cardiac silhouette remains enlarged. The sternal wires are intact. The tracheostomy appliance tip projects at the inferior margin of the clavicular heads. The feeding tube tip projects below the inferior margin of the image. IMPRESSION: Mild pulmonary interstitial edema more conspicuous than on the previous study. Persistent left lower lobe atelectasis or pneumonia with small left pleural effusion. Cardiomegaly. No  pneumothorax. Electronically Signed   By: David  Martinique M.D.   On: 11/08/2015 07:37   Dg Chest Port 1 View  Result Date: 11/07/2015 CLINICAL DATA:  Follow-up chest tube and tracheostomy tube. EXAM: PORTABLE CHEST 1 VIEW COMPARISON:  Yesterday. FINDINGS: Stable right chest tube. Possible tiny right apical pneumothorax. The tracheostomy tube is in satisfactory position. Feeding tube extending into the stomach. Stable enlarged cardiac silhouette and post CABG changes. No significant change in left lower lobe airspace opacity. A small left pleural effusion is currently demonstrated. Unremarkable bones. IMPRESSION: 1. Stable right chest to with a possible tiny right apical pneumothorax currently demonstrated. 2. No significant change in left lower lobe atelectasis or pneumonia. 3. Small left pleural effusion. Electronically Signed   By: Claudie Revering M.D.   On: 11/07/2015 08:02    Studies:  Chest [place] 10/26: moderate effusion rt with significant compressive atx, small eff to mod left  Microbiology: MRSA PCR 10/18:  Negative  Tracheal Asp Ctx 10/18:  Oral Flora Blood Ctx x2 10/18:  Negative Tracheal Asp Ctx 10/19:  Oral Flora Tracheal Asp Ctx 10/24:  Oral Flora Blood Ctx 10/24:  1/2 Bottles MRSE Blood Ctx 10/26 >>  1/2 bottles Coag Neg Staph  C diff 10/28:  Negative  Right Pleural Effusion 10/27 >>  Antibiotics: Levaquin 10/17 - 10/24 Aztreonam 10/24 - 10/29 Vancomycin 10/17 - 10/18; 10/25 >>  Significant Events: 10/17 - Admit, cardiac cath, emergent CABG due to NSTEMI. 10/18 - 0600 PCCM consulted for refractory hypoxia 10/25 - reintubated for increased WOB and hypoxia.  10/26 - trached by DF  Lines/Tubes: OETT 10/17 - 10/26 L Rad Art Line 10/17 - 10/26 R IJ PA Cat 10/17 - out CT x3 10/17 - last removed 10/29 Trach (DF) 10/26 >> R NGT 10/26 >> Foley 10/17 >> PIV x1  Assessment/Plan:  81 y.o. male presented with increasing dyspnea and a fib with RVR.  Found to have 99% Lt main CAD  and had emergent CABG 10/17.  patient continues to have rising white blood cell count without fever. Did recently have central venous access in place with repeat cultures positive for Staphylococcus epidermidis. Question the possibility of endocarditis despite normal echocardiogram from 10/24 as well as possible abscess formation.   1. Acute Hypoxic Respiratory Failure:  S/P Trach for vocal chord edema & failure to wean. Continuing PS wean as tolerated and short periods of tracheostomy collar. 2. Sepsis:  Unclear etiology. Leukocytosis continuing to trend upward but procalcitonin reassuring. Continuing vancomycin. Checking CT scan chest, abdomen, & pelvis without contrast. 3. Possible HCAP:  Questionable. S/P 5 days of Aztreonam & ongoing treatment with Vancomycin Day #6. 4. Possible MRSE Bacteremia: Continuing empiric vancomycin. Repeating transthoracic echocardiogram.  5. NSTEMI:  S/P CABG on 10/17. Management per Dr. Prescott Gum. 6. Atrial Fibrillation w/ RVR: Management per Dr. Prescott Gum. Currently on Lopressor bid. Starting on Amiodarone drip. 7. Acute on Chronic Renal Failure Stage II:  Resolved. monitoring renal function and electrolytes  daily.  8. Acute Encephalopathy:  Resolving. Likely multifactorial with a contribution of toxic metabolic. Trying to avoid further sedating medications.  9. Hypernatremia:  6L Free water deficit this morning. Last dose of Lasix 10/29. Continuing dextrose infusion. Adding 200cc Free Water q8hr via tube. 10. Hyperchloremia:  Continuing to hold Lasix. Trending electrolytes daily.  11. Metabolic Alkalosis:  Mild. Likely come element of contraction & compensation.  12. Nausea w/ Emesis:  Currently on Reglan scheduled. 13. Diarrhea:  C diff negative 10/28. Improving. 14. Diabetes Mellitus Type II:  Glucose controlled. Continuing SSI per algorithm with accu-checks q4hr. Continuing to hold Levemir. 15. Chronic Diastolic Congestive Heart Failure:  Grade 2 dysfunction on  10/24 echocardiogram. Continuing ASA 325mg , Lipitor 80mg , & Lopressor bid. Holding home Coreg, Cozaar, Imdur, Hydralazine, Fenofibrate, Zocor, Niacin, & Lasix. 16. H/O Moderate COPD:  No signs of exacerbation. Continuing Xopenex nebs tid & Pulmicort neb bid. 13. H/O GERD:  Continuing Pepcid IV q12hr.  18. Anemia:  No signs of active bleeding. Hgb stable. Continuing to trend cell counts daily w/ CBC. 19. Cardiogeic Shock:  Resolved. 20. Diet:  Restarting gentle tube feedings. 21. Prophylaxis:  Lovenox Fries q24hr, Pepcid IV q12hr, & SCDs.  I have spent a total of 46 minutes of critical care time today caring for the patient and reviewing the patient's electronic medical record.   Sonia Baller Ashok Cordia, M.D. [facility]monary [facility] Pager:  [phone] After 3pm or if no response, call [phone] 11/08/2015 8:22 AM

## 2015-11-08 NOTE — Progress Notes (Signed)
13 Days Post-Op Procedure(s) (LRB): CORONARY ARTERY BYPASS GRAFTING (CABG) x3(LIMA to LA, SVG to OM1, SVG to PDA) with EVH from the left thigh and partial lower leg greater saphenous vein and left internal mammary artery (N/A) TRANSESOPHAGEAL ECHOCARDIOGRAM (TEE) (N/A) INTRA-AORTIC BALLOON PUMP INSERTION size 40cc (Left) Subjective: Pulmonary status improving with less oxygen demands and tolerating pressure support weaning White count elevated 19,000 without clear source CT scan of chest shows possible right basilar pneumonia-atelectasis Continue IV empiric antibiotics Resume tube feeds with postpyloric feeding tube and continuous nasogastric suction for gastric dilatation-gastroparesis  Objective: Vital signs in last 24 hours: Temp:  [98.1 F (36.7 C)-99.1 F (37.3 C)] 98.3 F (36.8 C) (10/30 1621) Pulse Rate:  [34-110] 57 (10/30 1800) Cardiac Rhythm: Atrial fibrillation (10/30 1400) Resp:  [5-27] 27 (10/30 1800) BP: (112-172)/(20-135) 158/85 (10/30 1800) SpO2:  [93 %-100 %] 93 % (10/30 1800) FiO2 (%):  [40 %-50 %] 40 % (10/30 1800) Weight:  [221 lb 5.5 oz (100.4 kg)] 221 lb 5.5 oz (100.4 kg) (10/30 0500)  Hemodynamic parameters for last 24 hours:  atrial fibrillation on IV amiodarone  Intake/Output from previous day: 10/29 0701 - 10/30 0700 In: 3702.9 [I.V.:2392.9; NG/GT:110; IV Piggyback:1200] Out: [id number] [Urine:3400; Chest Tube:20] Intake/Output this shift: Total I/O In: 1798.6 [I.V.:1181.6; NG/GT:567; IV Piggyback:50] Out: 1005 [Urine:1005]  Alert and engaged Coarse breath sounds right base Sternal incision clean Extremities edematous, warm  Lab Results:  Recent Labs  11/07/15 0722 11/08/15 0305  WBC 15.3* 19.7*  HGB 9.1* 8.9*  HCT 30.5* 30.0*  PLT 497* 507*   BMET:  Recent Labs  11/07/15 0722 11/08/15 0305  NA 155* 157*  K 3.8 3.8  CL 118* 119*  CO2 30 33*  GLUCOSE 156* 106*  BUN 87* 68*  CREATININE 1.32* 1.30*  CALCIUM 8.2* 8.5*    PT/INR: No  results for input(s): LABPROT, INR in the last 72 hours. ABG    Component Value Date/Time   PHART 7.381 11/04/2015 0330   HCO3 30.7 (H) 11/04/2015 0330   TCO2 31 11/05/2015 1803   ACIDBASEDEF 1.0 10/29/2015 1531   O2SAT 91.3 11/04/2015 0330   CBG (last 3)   Recent Labs  11/08/15 0751 11/08/15 1143 11/08/15 1620  GLUCAP 132* 123* 139*    Assessment/Plan: S/P Procedure(s) (LRB): CORONARY ARTERY BYPASS GRAFTING (CABG) x3(LIMA to LA, SVG to OM1, SVG to PDA) with EVH from the left thigh and partial lower leg greater saphenous vein and left internal mammary artery (N/A) TRANSESOPHAGEAL ECHOCARDIOGRAM (TEE) (N/A) INTRA-AORTIC BALLOON PUMP INSERTION size 40cc (Left) Continue current care Slowly advance antibiotics Place NG tube to decompress gastric dilatation and continue Reglan   LOS: 14 days    Tharon Aquas Trigt III 11/08/2015

## 2015-11-08 NOTE — Progress Notes (Signed)
Peripherally Inserted Central Catheter/Midline Placement  The IV Nurse has discussed with the patient and/or persons authorized to consent for the patient, the purpose of this procedure and the potential benefits and risks involved with this procedure.  The benefits include less needle sticks, lab draws from the catheter, and the patient may be discharged home with the catheter. Risks include, but not limited to, infection, bleeding, blood clot (thrombus formation), and puncture of an artery; nerve damage and irregular heartbeat and possibility to perform a PICC exchange if needed/ordered by physician.  Alternatives to this procedure were also discussed.  Bard Power PICC patient education guide, fact sheet on infection prevention and patient information card has been provided to patient /or left at bedside.    PICC/Midline Placement Documentation     Telephone conssent by daughter Wilberth, Caronna Albarece 11/08/2015, 10:38 AM

## 2015-11-09 ENCOUNTER — Inpatient Hospital Stay (HOSPITAL_COMMUNITY): Payer: Medicare Other

## 2015-11-09 DIAGNOSIS — E876 Hypokalemia: Secondary | ICD-10-CM

## 2015-11-09 DIAGNOSIS — R7881 Bacteremia: Secondary | ICD-10-CM

## 2015-11-09 LAB — CBC
HCT: 27.4 % — ABNORMAL LOW (ref 39.0–52.0)
Hemoglobin: 8.5 g/dL — ABNORMAL LOW (ref 13.0–17.0)
MCH: 30.7 pg (ref 26.0–34.0)
MCHC: 31 g/dL (ref 30.0–36.0)
MCV: 98.9 fL (ref 78.0–100.0)
Platelets: 478 10*3/uL — ABNORMAL HIGH (ref 150–400)
RBC: 2.77 MIL/uL — ABNORMAL LOW (ref 4.22–5.81)
RDW: 16.1 % — ABNORMAL HIGH (ref 11.5–15.5)
WBC: 19.9 10*3/uL — ABNORMAL HIGH (ref 4.0–10.5)

## 2015-11-09 LAB — GLUCOSE, CAPILLARY
Glucose-Capillary: 111 mg/dL — ABNORMAL HIGH (ref 65–99)
Glucose-Capillary: 123 mg/dL — ABNORMAL HIGH (ref 65–99)
Glucose-Capillary: 129 mg/dL — ABNORMAL HIGH (ref 65–99)
Glucose-Capillary: 132 mg/dL — ABNORMAL HIGH (ref 65–99)
Glucose-Capillary: 137 mg/dL — ABNORMAL HIGH (ref 65–99)
Glucose-Capillary: 147 mg/dL — ABNORMAL HIGH (ref 65–99)
Glucose-Capillary: 150 mg/dL — ABNORMAL HIGH (ref 65–99)

## 2015-11-09 LAB — COMPREHENSIVE METABOLIC PANEL
ALT: 27 U/L (ref 17–63)
AST: 38 U/L (ref 15–41)
Albumin: 2.3 g/dL — ABNORMAL LOW (ref 3.5–5.0)
Alkaline Phosphatase: 64 U/L (ref 38–126)
Anion gap: 5 (ref 5–15)
BUN: 58 mg/dL — ABNORMAL HIGH (ref 6–20)
CO2: 30 mmol/L (ref 22–32)
Calcium: 8.3 mg/dL — ABNORMAL LOW (ref 8.9–10.3)
Chloride: 119 mmol/L — ABNORMAL HIGH (ref 101–111)
Creatinine, Ser: 1.22 mg/dL (ref 0.61–1.24)
GFR calc Af Amer: >60 mL/min (ref >60)
GFR calc non Af Amer: 55 mL/min — ABNORMAL LOW (ref >60)
Glucose, Bld: 188 mg/dL — ABNORMAL HIGH (ref 65–99)
Potassium: 3.4 mmol/L — ABNORMAL LOW (ref 3.5–5.1)
Sodium: 154 mmol/L — ABNORMAL HIGH (ref 135–145)
Total Bilirubin: 0.8 mg/dL (ref 0.3–1.2)
Total Protein: 5.7 g/dL — ABNORMAL LOW (ref 6.5–8.1)

## 2015-11-09 LAB — CULTURE, BLOOD (ROUTINE X 2): Culture: NO GROWTH

## 2015-11-09 LAB — VANCOMYCIN, TROUGH: Vancomycin Tr: 12 ug/mL — ABNORMAL LOW (ref 15–20)

## 2015-11-09 LAB — MAGNESIUM: Magnesium: 2.7 mg/dL — ABNORMAL HIGH (ref 1.7–2.4)

## 2015-11-09 MED ORDER — DILTIAZEM HCL 100 MG IV SOLR
8.0000 mg/h | INTRAVENOUS | Status: DC
  Administered 2015-11-09 (×2): 8 mg/h via INTRAVENOUS
  Filled 2015-11-09 (×2): qty 100

## 2015-11-09 MED ORDER — ENOXAPARIN SODIUM 40 MG/0.4ML ~~LOC~~ SOLN
40.0000 mg | SUBCUTANEOUS | Status: AC
  Administered 2015-11-09 – 2015-11-17 (×9): 40 mg via SUBCUTANEOUS
  Filled 2015-11-09 (×9): qty 0.4

## 2015-11-09 MED ORDER — VANCOMYCIN HCL IN DEXTROSE 750-5 MG/150ML-% IV SOLN
750.0000 mg | Freq: Two times a day (BID) | INTRAVENOUS | Status: DC
  Administered 2015-11-10 (×2): 750 mg via INTRAVENOUS
  Filled 2015-11-09 (×4): qty 150

## 2015-11-09 MED ORDER — POTASSIUM CHLORIDE 10 MEQ/50ML IV SOLN
10.0000 meq | INTRAVENOUS | Status: AC
  Administered 2015-11-09 (×3): 10 meq via INTRAVENOUS
  Filled 2015-11-09: qty 50

## 2015-11-09 MED ORDER — JEVITY 1.2 CAL PO LIQD
1000.0000 mL | ORAL | Status: DC
  Filled 2015-11-09: qty 1000

## 2015-11-09 NOTE — Progress Notes (Signed)
Echocardiogram 2D Echocardiogram limited has been performed.  Mark Rivers 11/09/2015, 4:40 PM

## 2015-11-09 NOTE — Progress Notes (Signed)
14 Days Post-Op Procedure(s) (LRB): CORONARY ARTERY BYPASS GRAFTING (CABG) x3(LIMA to LA, SVG to OM1, SVG to PDA) with EVH from the left thigh and partial lower leg greater saphenous vein and left internal mammary artery (N/A) TRANSESOPHAGEAL ECHOCARDIOGRAM (TEE) (N/A) INTRA-AORTIC BALLOON PUMP INSERTION size 40cc (Left) Subjective: BUN creatinine and sodium slowly improving with hydration and free water via feeding tube Amiodarone stopped and IV Cardizem drip started for persistent atrial fibrillation CT scan of chest shows possible right lower lobe pneumonia, no evidence of mediastinal infection, healing sternotomy Echocardiogram shows lateral LV hypokinesia, EF probably 45% with report pending White count slightly improved with broad-spectrum antibiotics  Objective: Vital signs in last 24 hours: Temp:  [98 F (36.7 C)-98.7 F (37.1 C)] 98 F (36.7 C) (10/31 1607) Pulse Rate:  [30-106] 97 (10/31 1800) Cardiac Rhythm: Atrial fibrillation (10/31 0800) Resp:  [14-23] 21 (10/31 1800) BP: (126-169)/(53-131) 161/75 (10/31 1700) SpO2:  [91 %-100 %] 100 % (10/31 1800) FiO2 (%):  [40 %-50 %] 50 % (10/31 1520) Weight:  [220 lb 7.4 oz (100 kg)] 220 lb 7.4 oz (100 kg) (10/31 0200)  Hemodynamic parameters for last 24 hours:  afebrile  Intake/Output from previous day: 10/30 0701 - 10/31 0700 In: 4077.3 [I.V.:2585.3; NG/GT:1042; IV Piggyback:450] Out: 2810 [Urine:2060; Emesis/NG output:550; Stool:200] Intake/Output this shift: Total I/O In: 1593.1 [I.V.:1173.1; NG/GT:420] Out: 1315 [Urine:815; Emesis/NG output:500]       Exam    General- alert and comfortable on tracheostomy and ventilator   Lungs- clear without rales, wheezes   Cor- regular rate and rhythm, no murmur , gallop   Abdomen- soft, non-tender   Extremities - warm, non-tender, minimal edema   Neuro- oriented, appropriate, no focal weakness   Lab Results:  Recent Labs  11/08/15 0305 11/09/15 0402  WBC 19.7* 19.9*   HGB 8.9* 8.5*  HCT 30.0* 27.4*  PLT 507* 478*   BMET:  Recent Labs  11/08/15 0305 11/09/15 0402  NA 157* 154*  K 3.8 3.4*  CL 119* 119*  CO2 33* 30  GLUCOSE 106* 188*  BUN 68* 58*  CREATININE 1.30* 1.22  CALCIUM 8.5* 8.3*    PT/INR: No results for input(s): LABPROT, INR in the last 72 hours. ABG    Component Value Date/Time   PHART 7.381 11/04/2015 0330   HCO3 30.7 (H) 11/04/2015 0330   TCO2 31 11/05/2015 1803   ACIDBASEDEF 1.0 10/29/2015 1531   O2SAT 91.3 11/04/2015 0330   CBG (last 3)   Recent Labs  11/09/15 0816 11/09/15 1204 11/09/15 1604  GLUCAP 123* 137* 132*    Assessment/Plan: S/P Procedure(s) (LRB): CORONARY ARTERY BYPASS GRAFTING (CABG) x3(LIMA to LA, SVG to OM1, SVG to PDA) with EVH from the left thigh and partial lower leg greater saphenous vein and left internal mammary artery (N/A) TRANSESOPHAGEAL ECHOCARDIOGRAM (TEE) (N/A) INTRA-AORTIC BALLOON PUMP INSERTION size 40cc (Left) Continue current care Patient with poor primary function, emergency operation without preop PFTs Slow ventilator wean anticipated Follow-up cultures  LOS: 15 days    Tharon Aquas Trigt III 11/09/2015

## 2015-11-09 NOTE — Progress Notes (Signed)
PCCM PROGRESS NOTE   Admission date:  10/25/2015 Consult date:  10/27/2015 Referring provider:  Dr. Prescott Gum  CC:  Short of breath  Subjective: No acute events overnight. Patient tolerating tracheostomy collar trials for approximately 4 hours yesterday. Transitioned from amiodarone until diltiazem yesterday. Patient continuing to tolerate tracheostomy collar trials. Denies any significant cough or mucus production. Denies any chest pain, pressure, or tightness. Denies any abdominal pain or nausea.  Review of Systems:  Unable to obtain with patient on tracheostomy collar.   Vital signs: BP (!) 151/74   Pulse 86   Temp 98.2 F (36.8 C) (Oral)   Resp (!) 22   Ht 5\' 6"  (1.676 m)   Wt 220 lb 7.4 oz (100 kg)   SpO2 100%   BMI 35.58 kg/m    Intake/output: I/O last 3 completed shifts: In: 6347.3 [I.V.:3685.3; NG/GT:1112; IV Piggyback:1550] Out: 89 [Urine:3510; Emesis/NG output:550; Stool:200]  Hemodynamics:    Vent settings: Vent Mode: PCV FiO2 (%):  [40 %-50 %] 50 % Set Rate:  [16 bmp] 16 bmp PEEP:  [5 cmH20] 5 cmH20 Pressure Support:  [10 cmH20] 10 cmH20 Plateau Pressure:  [16 cmH20] 16 cmH20  Physcial Exam: General:  Awake. No distress. Brother at bedside.  Integument:  Warm & dry. No rash on exposed skin. Sternal incision clean, dry, and intact. HEENT: NG tube within right nare. No scleral icterus. Tracheostomy in place. Cardiovascular: Irregularly irregular rhythm. Mild anasarca. No appreciable JVD. Pulmonary:  Normal work of breathing on tracheostomy collar. Clear on auscultation anteriorly with slightly decreased breath sounds in bases. Abdomen: Soft. Hyperactive bowel sounds. Protuberant. Nontender. Musculoskeletal:  No joint deformity or effusion appreciated. Neurological:  Nods appropriately & wiggles toes on command. Grossly nonfocal.  LABS: CMP Latest Ref Rng & Units 11/09/2015 11/08/2015 11/07/2015  Glucose 65 - 99 mg/dL 188(H) 106(H) 156(H)  BUN 6 - 20  mg/dL 58(H) 68(H) 87(H)  Creatinine 0.61 - 1.24 mg/dL 1.22 1.30(H) 1.32(H)  Sodium 135 - 145 mmol/L 154(H) 157(H) 155(H)  Potassium 3.5 - 5.1 mmol/L 3.4(L) 3.8 3.8  Chloride 101 - 111 mmol/L 119(H) 119(H) 118(H)  CO2 22 - 32 mmol/L 30 33(H) 30  Calcium 8.9 - 10.3 mg/dL 8.3(L) 8.5(L) 8.2(L)  Total Protein 6.5 - 8.1 g/dL 5.7(L) 6.0(L) -  Total Bilirubin 0.3 - 1.2 mg/dL 0.8 0.7 -  Alkaline Phos 38 - 126 U/L 64 67 -  AST 15 - 41 U/L 38 33 -  ALT 17 - 63 U/L 27 26 -    CBC Latest Ref Rng & Units 11/09/2015 11/08/2015 11/07/2015  WBC 4.0 - 10.5 K/uL 19.9(H) 19.7(H) 15.3(H)  Hemoglobin 13.0 - 17.0 g/dL 8.5(L) 8.9(L) 9.1(L)  Hematocrit 39.0 - 52.0 % 27.4(L) 30.0(L) 30.5(L)  Platelets 150 - 400 K/uL 478(H) 507(H) 497(H)    ABG    Component Value Date/Time   PHART 7.381 11/04/2015 0330   PCO2ART 52.9 (H) 11/04/2015 0330   PO2ART 65.5 (L) 11/04/2015 0330   HCO3 30.7 (H) 11/04/2015 0330   TCO2 31 11/05/2015 1803   ACIDBASEDEF 1.0 10/29/2015 1531   O2SAT 91.3 11/04/2015 0330    CBG (last 3)   Recent Labs  11/09/15 0022 11/09/15 0355 11/09/15 0816  GLUCAP 111* 150* 123*    Imaging: Ct Abdomen Pelvis Wo Contrast  Result Date: 11/08/2015 CLINICAL DATA:  Leukocytosis, evaluate for source of sepsis. EXAM: CT CHEST, ABDOMEN AND PELVIS WITHOUT CONTRAST TECHNIQUE: Multidetector CT imaging of the chest, abdomen and pelvis was performed following the standard protocol  without IV contrast. COMPARISON:  None. FINDINGS: CT CHEST FINDINGS Cardiovascular: Right PICC tip is in the right atrium. Atherosclerotic calcification of the arterial vasculature. Heart is enlarged. No pericardial effusion. Mediastinum/Nodes: Subcarinal lymph node measures 1.8 cm. Mediastinal lymph nodes are otherwise sub cm in short axis size. Hilar regions are difficult to definitively evaluate without IV contrast. No axillary adenopathy. Feeding tube traverses the esophagus. Lungs/Pleura: Image quality is degraded by  respiratory motion. Somewhat basilar dependent septal thickening, ground-glass and mild nodularity, likely superimposed on centrilobular emphysema. Small left pleural effusion with patchy consolidation in the left lower lobe. Tiny right pleural effusion with scattered patchy consolidation in the right lower lobe. Calcified pleural plaques bilaterally. Musculoskeletal: No worrisome lytic or sclerotic lesions. Degenerative changes are seen in the spine. CT ABDOMEN PELVIS FINDINGS Hepatobiliary: Liver and gallbladder are unremarkable. No biliary ductal dilatation. Pancreas: Negative. Spleen: Negative. Adrenals/Urinary Tract: Adrenal glands are unremarkable. There may be punctate stones versus vascular calcifications in the right kidney. Probable vascular calcification in the left kidney. Ureters are decompressed. Foley catheter and air are seen in the bladder. Stomach/Bowel: Feeding tube traverses stomach, terminating in the horizontal portion of the duodenum. Small bowel and colon are otherwise unremarkable. A catheter balloon is inflated in the rectum. Vascular/Lymphatic: Atherosclerotic calcification of the arterial vasculature without abdominal aortic aneurysm. No pathologically enlarged lymph nodes. Reproductive: Prostate is visualized. Other: Presacral edema. No free fluid. Tiny periumbilical hernia contains fat. Musculoskeletal: No worrisome lytic or sclerotic lesions. Degenerative changes are seen in the spine and sacroiliac joints. IMPRESSION: 1. Probable congestive heart failure. Patchy consolidation in both lower lobes, left greater than right, worrisome for superimposed pneumonia. 2.  Aortic atherosclerosis (ICD10-170.0). 3. Enlarged subcarinal lymph node, likely reactive. 4. Asbestos related pleural disease. Electronically Signed   By: Lorin Picket M.D.   On: 11/08/2015 16:36   Ct Chest Wo Contrast  Result Date: 11/08/2015 CLINICAL DATA:  Leukocytosis, evaluate for source of sepsis. EXAM: CT CHEST,  ABDOMEN AND PELVIS WITHOUT CONTRAST TECHNIQUE: Multidetector CT imaging of the chest, abdomen and pelvis was performed following the standard protocol without IV contrast. COMPARISON:  None. FINDINGS: CT CHEST FINDINGS Cardiovascular: Right PICC tip is in the right atrium. Atherosclerotic calcification of the arterial vasculature. Heart is enlarged. No pericardial effusion. Mediastinum/Nodes: Subcarinal lymph node measures 1.8 cm. Mediastinal lymph nodes are otherwise sub cm in short axis size. Hilar regions are difficult to definitively evaluate without IV contrast. No axillary adenopathy. Feeding tube traverses the esophagus. Lungs/Pleura: Image quality is degraded by respiratory motion. Somewhat basilar dependent septal thickening, ground-glass and mild nodularity, likely superimposed on centrilobular emphysema. Small left pleural effusion with patchy consolidation in the left lower lobe. Tiny right pleural effusion with scattered patchy consolidation in the right lower lobe. Calcified pleural plaques bilaterally. Musculoskeletal: No worrisome lytic or sclerotic lesions. Degenerative changes are seen in the spine. CT ABDOMEN PELVIS FINDINGS Hepatobiliary: Liver and gallbladder are unremarkable. No biliary ductal dilatation. Pancreas: Negative. Spleen: Negative. Adrenals/Urinary Tract: Adrenal glands are unremarkable. There may be punctate stones versus vascular calcifications in the right kidney. Probable vascular calcification in the left kidney. Ureters are decompressed. Foley catheter and air are seen in the bladder. Stomach/Bowel: Feeding tube traverses stomach, terminating in the horizontal portion of the duodenum. Small bowel and colon are otherwise unremarkable. A catheter balloon is inflated in the rectum. Vascular/Lymphatic: Atherosclerotic calcification of the arterial vasculature without abdominal aortic aneurysm. No pathologically enlarged lymph nodes. Reproductive: Prostate is visualized. Other:  Presacral edema. No free  fluid. Tiny periumbilical hernia contains fat. Musculoskeletal: No worrisome lytic or sclerotic lesions. Degenerative changes are seen in the spine and sacroiliac joints. IMPRESSION: 1. Probable congestive heart failure. Patchy consolidation in both lower lobes, left greater than right, worrisome for superimposed pneumonia. 2.  Aortic atherosclerosis (ICD10-170.0). 3. Enlarged subcarinal lymph node, likely reactive. 4. Asbestos related pleural disease. Electronically Signed   By: Lorin Picket M.D.   On: 11/08/2015 16:36   Dg Chest Port 1 View  Result Date: 11/09/2015 CLINICAL DATA:  Status post CABG on July 26, 2015, tracheostomy patient. EXAM: PORTABLE CHEST 1 VIEW COMPARISON:  Portable chest x-ray and chest CT scan of November 08, 2015 FINDINGS: Worsening of interstitial and early alveolar opacities on the right has occurred. Fairly stable density is present in the left perihilar region. The retrocardiac region on the left remains dense and the hemidiaphragm obscured. The cardiac silhouette remains mildly enlarged. The central pulmonary vascularity remains engorged. The mediastinum is normal in width. There is calcification in the wall of the aortic arch. The tracheostomy appliance tip lies at the level of the clavicular heads. The feeding tube and esophagogastric tube tips project below the inferior margin of the image. The PICC line tip on the right projects over the proximal SVC. IMPRESSION: Interval worsening of interstitial and alveolar opacities on the right worrisome for pneumonia. Stable left basilar atelectasis and small left pleural effusion. Stable cardiomegaly with mild central pulmonary vascular congestion. Electronically Signed   By: David  Martinique M.D.   On: 11/09/2015 08:38   Dg Chest Port 1 View  Result Date: 11/08/2015 CLINICAL DATA:  PICC line placement EXAM: PORTABLE CHEST 1 VIEW COMPARISON:  11/08/2015 FINDINGS: Cardiomegaly again noted. Status post median  sternotomy. Stable tracheostomy tube position. Central mild vascular congestion and mild interstitial prominence suspicious for mild interstitial edema. A there is right arm PICC line with tip in distal SVC. No pneumothorax. IMPRESSION: Right PICC line with tip in SVC. No pneumothorax. Persistent mild congestion/ edema. Electronically Signed   By: Lahoma Crocker M.D.   On: 11/08/2015 11:12   Dg Chest Port 1 View  Result Date: 11/08/2015 CLINICAL DATA:  Respiratory failure, tracheostomy patient. History of coronary artery disease, and CABG 13 days ago. EXAM: PORTABLE CHEST 1 VIEW COMPARISON:  Portable chest x-ray of November 07, 2015 FINDINGS: There is been interval removal of the right-sided chest tube. No pneumothorax is evident today. The lungs remain hyperinflated. There is persistent left lower lobe atelectasis or pneumonia. There is small left pleural effusion which has decreased slightly. The pulmonary interstitial markings are more conspicuous overall today. The cardiac silhouette remains enlarged. The sternal wires are intact. The tracheostomy appliance tip projects at the inferior margin of the clavicular heads. The feeding tube tip projects below the inferior margin of the image. IMPRESSION: Mild pulmonary interstitial edema more conspicuous than on the previous study. Persistent left lower lobe atelectasis or pneumonia with small left pleural effusion. Cardiomegaly. No pneumothorax. Electronically Signed   By: David  Martinique M.D.   On: 11/08/2015 07:37    Studies:  Chest [place] 10/26: moderate effusion rt with significant compressive atx, small eff to mod left CT Chest/Abd/Pelvis W/O 10/30: IMPRESSION: 1. Probable congestive heart failure. Patchy consolidation in both lower lobes, left greater than right, worrisome for superimposed pneumonia. 2.  Aortic atherosclerosis (ICD10-170.0). 3. Enlarged subcarinal lymph node, likely reactive. 4. Asbestos related pleural disease.  Microbiology: MRSA PCR  10/18:  Negative  Tracheal Asp Ctx 10/18:  Oral Flora Blood Ctx  x2 10/18:  Negative Tracheal Asp Ctx 10/19:  Oral Flora Tracheal Asp Ctx 10/24:  Oral Flora Blood Ctx 10/24:  1/2 Bottles MRSE Blood Ctx 10/26 >>  1/2 bottles Coag Neg Staph  C diff 10/28:  Negative  Right Pleural Effusion 10/27 >> Tracheal Asp Ctx 10/30 >>  Antibiotics: Levaquin 10/17 - 10/24 Aztreonam 10/24 - 10/29 Vancomycin 10/17 - 10/18; 10/25 >>  Significant Events: 10/17 - Admit, cardiac cath, emergent CABG due to NSTEMI. 10/18 - 0600 PCCM consulted for refractory hypoxia 10/25 - reintubated for increased WOB and hypoxia.  10/26 - trached by DF  Lines/Tubes: OETT 10/17 - 10/26 L Rad Art Line 10/17 - 10/26 R IJ PA Cat 10/17 - out CT x3 10/17 - last removed 10/29 Trach (DF) 10/26 >> R NGT 10/26 >> Foley 10/17 >> PIV x1  Assessment/Plan:  81 y.o. male presented with increasing dyspnea and a fib with RVR.  Found to have 99% Lt main CAD and had emergent CABG 10/17. Patient continues to have elevated white blood cell count without fever. Did recently have central venous access in place with repeat cultures positive for Staphylococcus epidermidis. I am repeating blood cultures today while awaiting transthoracic echocardiogram. Given his tolerance of tracheostomy collar I am consulted speech therapy for [facility] valve. Continuing empiric vancomycin for now while awaiting for additional culture results. Overall seems to be progressing well.  1. Acute Hypoxic Respiratory Failure:  S/P Trach for vocal chord edema & failure to wean. Continuing PS wean and tracheostomy collar 4 hours on and 4 hours rest. Consulting speech therapy for [employer] valve. 2. Sepsis:  Unclear etiology. Leukocytosis stable & procalcitonin reassuring. Continuing vancomycin. Awaiting Tracheal Aspirate culture & repeating blood cultures today. 3. Possible HCAP:  Questionable. S/P 5 days of Aztreonam & ongoing treatment with Vancomycin Day  #7. 4. Possible MRSE Bacteremia: Continuing empiric Vancomycin. Awaiting repeat transthoracic echocardiogram. Repeating blood cultures x2 today. 5. NSTEMI:  S/P CABG on 10/17. Management per Dr. Prescott Gum. ASA 325mg  daily & Lipitor qhs. 6. Atrial Fibrillation w/ RVR: Management per Dr. Prescott Gum. Lopressor bid & Diltiazem drip. 7. Acute on Chronic Renal Failure Stage II:  Resolved. monitoring renal function and electrolytes daily.  8. Acute Encephalopathy:  Resolving. Likely multifactorial with a contribution of toxic metabolic. Trying to avoid further sedating medications.  9. Hypernatremia:  Improving. 5L Free water deficit this morning. Last dose of Lasix 10/29. Continuing D5W @ 100cc/hr &  200cc Free Water q8hr via tube. 10. Hyperchloremia:  Stable. Continuing to hold Lasix. Trending electrolytes daily.  11. Hypokalemia:  Replaced with KCl 63mEq IV x3 doses. Repeat electrolytes in AM 11/1. 12. Metabolic Alkalosis:  Resolved. Likely come element of contraction & compensation.  13. Nausea w/ Emesis:  Currently on Reglan scheduled. Has Zofran IV as needed. 14. Diarrhea:  C diff negative 10/28. Improving. 15. Diabetes Mellitus Type II:  Glucose controlled. Continuing SSI per algorithm with accu-checks q4hr. Continuing to hold Levemir. 16. Chronic Diastolic Congestive Heart Failure:  Grade 2 dysfunction on 10/24 echocardiogram. Continuing ASA 325mg , Lipitor 80mg , & Lopressor bid. Holding home Coreg, Cozaar, Imdur, Hydralazine, Fenofibrate, Zocor, Niacin, & Lasix. 27. H/O Moderate COPD:  No signs of exacerbation. Continuing Xopenex nebs tid & Pulmicort neb bid. 18. H/O GERD:  Continuing Pepcid IV q12hr.  19. Anemia:  No signs of active bleeding. Hgb stable. Continuing to trend cell counts daily w/ CBC. 20. Cardiogeic Shock:  Resolved. 21. Diet:  Continuing & tolerating tube feedings. 22. Prophylaxis:  Lovenox [place] q24hr, Pepcid IV q12hr, & SCDs.  I have spent a total of 35 minutes of critical care  time today caring for the patient, updating brother at bedside, and reviewing the patient's electronic medical record.   Sonia Baller Ashok Cordia, M.D. [facility] Pulmonary & Critical Care Pager:  [phone] After 3pm or if no response, call [phone] 11/09/2015 9:13 AM

## 2015-11-09 NOTE — Progress Notes (Signed)
Patient ID: Mark Rivers, male   DOB: May 28, 1934, 81 y.o.   MRN: [id number]  EVENING ROUNDS NOTE :     [name]       [place] [address]             [phone]                 14 Days Post-Op Procedure(s) (LRB): CORONARY ARTERY BYPASS GRAFTING (CABG) x3(LIMA to LA, SVG to OM1, SVG to PDA) with EVH from the left thigh and partial lower leg greater saphenous vein and left internal mammary artery (N/A) TRANSESOPHAGEAL ECHOCARDIOGRAM (TEE) (N/A) INTRA-AORTIC BALLOON PUMP INSERTION size 40cc (Left)  Total Length of Stay:  LOS: 15 days  BP (!) 161/75   Pulse 97   Temp 98 F (36.7 C) (Oral)   Resp (!) 21   Ht 5\' 6"  (1.676 m)   Wt 220 lb 7.4 oz (100 kg)   SpO2 100%   BMI 35.58 kg/m   .Intake/Output      10/31 0701 - 11/01 0700   I.V. (mL/kg) 1173.1 (11.7)   NG/GT 420   IV Piggyback    Total Intake(mL/kg) 1593.1 (15.9)   Urine (mL/kg/hr) 815 (0.7)   Emesis/NG output 500 (0.4)   Stool    Total Output 1315   Net +278.1         . dextrose 100 mL/hr at 11/09/15 1800  . diltiazem (CARDIZEM) infusion 8 mg/hr (11/09/15 1800)  . fentaNYL infusion INTRAVENOUS Stopped (11/05/15 1700)     Lab Results  Component Value Date   WBC 19.9 (H) 11/09/2015   HGB 8.5 (L) 11/09/2015   HCT 27.4 (L) 11/09/2015   PLT 478 (H) 11/09/2015   GLUCOSE 188 (H) 11/09/2015   TRIG 268 (H) 11/05/2015   ALT 27 11/09/2015   AST 38 11/09/2015   NA 154 (H) 11/09/2015   K 3.4 (L) 11/09/2015   CL 119 (H) 11/09/2015   CREATININE 1.22 11/09/2015   BUN 58 (H) 11/09/2015   CO2 30 11/09/2015   TSH 3.494 10/26/2015   INR 1.35 11/03/2015   Making 4 hours on trach collar  [name]  Beeper [phone] Office [phone] 11/09/2015 7:04 PM

## 2015-11-09 NOTE — Care Management Important Message (Signed)
Important Message  Patient Details  Name: Mark Rivers MRN: [id number] Date of Birth: 1934/10/08   Medicare Important Message Given:  Yes    [name] 11/09/2015, 11:34 AM

## 2015-11-09 NOTE — Progress Notes (Signed)
SLP Cancellation Note  Patient Details Name: Mark Rivers MRN: [id number] DOB: [birth year]   Cancelled treatment:         Order for PMSV. Pt has been on trach collar earlier today and is now back on vent. Spoke with RN and relayed ST would check with pt when he is on trach collar.   Houston Siren 11/09/2015, 3:55 PM   Orbie Pyo Colvin Caroli.Ed [employer] [phone]

## 2015-11-09 NOTE — Progress Notes (Signed)
Pharmacy Antibiotic Note  Mark Rivers is a 81 y.o. male admitted on 10/25/2015 with pneumonia and MRSE bacteremia.  Pharmacy has been consulted for Vancomycin Day #7 dosing. PICC has been removed.   Vancomycin trough is low at 12 (goal 15- 20 mcg/mL) on 1250 mg IV every 24 hours.  Afebrile, WBC up 19. CKD- SCr improving, normalized CrCl~50, good UOP. Current dose of 1250 mg infusing now (after level was drawn).    Plan: Change vancomycin to 750 mg IV every 12 hours for predicted trough of 18.  Monitor clinical progress, c/s, renal function, abx plan/LOT   Height: 5\' 6"  (167.6 cm) Weight: 220 lb 7.4 oz (100 kg) IBW/kg (Calculated) : 63.8  Temp (24hrs), Avg:98.2 F (36.8 C), Min:98 F (36.7 C), Max:98.6 F (37 C)   Recent Labs Lab 11/05/15 0435  11/06/15 0415 11/06/15 1528 11/07/15 0722 11/08/15 0305 11/09/15 0402 11/09/15 2130  WBC 13.5*  --  15.3*  --  15.3* 19.7* 19.9*  --   CREATININE 1.65*  < > 1.39* 1.42* 1.32* 1.30* 1.22  --   VANCOTROUGH  --   --   --   --   --   --   --  12*  < > = values in this interval not displayed.  Estimated Creatinine Clearance: 54.4 mL/min (by C-G formula based on SCr of 1.22 mg/dL).    Allergies  Allergen Reactions  . Amoxicillin Other (See Comments)    PATIENT PASSES OUT!!!!  . Cephalexin Other (See Comments)    PATIENT PASSES OUT!!!!  . Penicillin G Other (See Comments)    Has patient had a PCN reaction causing immediate rash, facial/tongue/throat swelling, SOB or lightheadedness with hypotension: Yes Has patient had a PCN reaction causing severe rash involving mucus membranes or skin necrosis: No Has patient had a PCN reaction that required hospitalization: Yes Has patient had a PCN reaction occurring within the last 10 years: Yes If all of the above answers are "NO", then may proceed with Cephalosporin use. PATIENT PASSES OUT!!!!   Antimicrobials this admission:  Levofloxacin 10/17>>10/23 Vancomycin  10/17>>10/18 Aztreonam 10/24>>10/30 Vancomycin 10/25>> (no doses charted on 10/26)  Dose adjustments this admission:   Microbiology results:  10/18 MRSA PCR neg 10/18 Resp: normal flora 10/19 Resp: normal flora 10/18 Blood x 2: neg 10/24 TA: normal flora 10/24 BCx: CONS; BCID + for MRSE 1/2 10/26 BCx: CONS 1/2 (MRSE on BCID) 10/27 R pleural fluid:ngtd 10/28 cdiff: neg 10/30: tracheal aspirate: reincubated for better growth 10/31 Blood x2: sent  Sloan Leiter, PharmD, BCPS Clinical Pharmacist [phone] 11/09/2015 10:38 PM

## 2015-11-10 ENCOUNTER — Inpatient Hospital Stay (HOSPITAL_COMMUNITY): Payer: Medicare Other

## 2015-11-10 DIAGNOSIS — Z1629 Resistance to other single specified antibiotic: Secondary | ICD-10-CM

## 2015-11-10 DIAGNOSIS — A498 Other bacterial infections of unspecified site: Secondary | ICD-10-CM

## 2015-11-10 HISTORY — DX: Resistance to other single specified antibiotic: Z16.29

## 2015-11-10 HISTORY — DX: Other bacterial infections of unspecified site: A49.8

## 2015-11-10 LAB — CULTURE, RESPIRATORY W GRAM STAIN
Culture: NORMAL
Special Requests: NORMAL

## 2015-11-10 LAB — GLUCOSE, CAPILLARY
Glucose-Capillary: 118 mg/dL — ABNORMAL HIGH (ref 65–99)
Glucose-Capillary: 135 mg/dL — ABNORMAL HIGH (ref 65–99)
Glucose-Capillary: 137 mg/dL — ABNORMAL HIGH (ref 65–99)
Glucose-Capillary: 139 mg/dL — ABNORMAL HIGH (ref 65–99)
Glucose-Capillary: 145 mg/dL — ABNORMAL HIGH (ref 65–99)
Glucose-Capillary: 163 mg/dL — ABNORMAL HIGH (ref 65–99)

## 2015-11-10 LAB — ECHOCARDIOGRAM LIMITED
HEIGHTINCHES: 66 in
Weight: 3527.36 oz

## 2015-11-10 LAB — COMPREHENSIVE METABOLIC PANEL
ALT: 26 U/L (ref 17–63)
AST: 38 U/L (ref 15–41)
Albumin: 2.2 g/dL — ABNORMAL LOW (ref 3.5–5.0)
Alkaline Phosphatase: 63 U/L (ref 38–126)
Anion gap: 5 (ref 5–15)
BUN: 48 mg/dL — ABNORMAL HIGH (ref 6–20)
CO2: 29 mmol/L (ref 22–32)
Calcium: 8.4 mg/dL — ABNORMAL LOW (ref 8.9–10.3)
Chloride: 122 mmol/L — ABNORMAL HIGH (ref 101–111)
Creatinine, Ser: 1.05 mg/dL (ref 0.61–1.24)
GFR calc Af Amer: >60 mL/min (ref >60)
GFR calc non Af Amer: >60 mL/min (ref >60)
Glucose, Bld: 174 mg/dL — ABNORMAL HIGH (ref 65–99)
Potassium: 3.2 mmol/L — ABNORMAL LOW (ref 3.5–5.1)
Sodium: 156 mmol/L — ABNORMAL HIGH (ref 135–145)
Total Bilirubin: 0.8 mg/dL (ref 0.3–1.2)
Total Protein: 5.5 g/dL — ABNORMAL LOW (ref 6.5–8.1)

## 2015-11-10 LAB — CBC
HCT: 27.8 % — ABNORMAL LOW (ref 39.0–52.0)
Hemoglobin: 8.4 g/dL — ABNORMAL LOW (ref 13.0–17.0)
MCH: 30.3 pg (ref 26.0–34.0)
MCHC: 30.2 g/dL (ref 30.0–36.0)
MCV: 100.4 fL — ABNORMAL HIGH (ref 78.0–100.0)
Platelets: 500 10*3/uL — ABNORMAL HIGH (ref 150–400)
RBC: 2.77 MIL/uL — ABNORMAL LOW (ref 4.22–5.81)
RDW: 16.4 % — ABNORMAL HIGH (ref 11.5–15.5)
WBC: 18.9 10*3/uL — ABNORMAL HIGH (ref 4.0–10.5)

## 2015-11-10 LAB — MAGNESIUM: MAGNESIUM: 2.6 mg/dL — AB (ref 1.7–2.4)

## 2015-11-10 LAB — CULTURE, BODY FLUID W GRAM STAIN -BOTTLE: Culture: NO GROWTH

## 2015-11-10 MED ORDER — POTASSIUM CHLORIDE 10 MEQ/50ML IV SOLN
10.0000 meq | INTRAVENOUS | Status: AC
  Administered 2015-11-10 (×3): 10 meq via INTRAVENOUS
  Filled 2015-11-10 (×2): qty 50

## 2015-11-10 MED ORDER — DILTIAZEM HCL-DEXTROSE 100-5 MG/100ML-% IV SOLN (PREMIX)
8.0000 mg | INTRAVENOUS | Status: DC
  Administered 2015-11-10 – 2015-11-11 (×3): 8 mg via INTRAVENOUS
  Filled 2015-11-10 (×2): qty 100

## 2015-11-10 MED ORDER — JEVITY 1.2 CAL PO LIQD
1000.0000 mL | ORAL | Status: DC
  Administered 2015-11-10 – 2015-11-11 (×2): 1000 mL
  Filled 2015-11-10 (×6): qty 1000

## 2015-11-10 MED ORDER — FREE WATER
200.0000 mL | Status: DC
  Administered 2015-11-10 – 2015-11-11 (×6): 200 mL

## 2015-11-10 MED ORDER — DILTIAZEM HCL 100 MG IV SOLR: 8.0000 mg/h | INTRAVENOUS | Status: DC

## 2015-11-10 NOTE — Progress Notes (Addendum)
[address]       [place] [address]             [phone]      15 Days Post-Op Procedure(s) (LRB): CORONARY ARTERY BYPASS GRAFTING (CABG) x3(LIMA to LA, SVG to OM1, SVG to PDA) with EVH from the left thigh and partial lower leg greater saphenous vein and left internal mammary artery (N/A) TRANSESOPHAGEAL ECHOCARDIOGRAM (TEE) (N/A) INTRA-AORTIC BALLOON PUMP INSERTION size 40cc (Left) Subjective: Asking when he can go home  Objective: Vital signs in last 24 hours: Temp:  [98 F (36.7 C)-98.8 F (37.1 C)] 98.7 F (37.1 C) (11/01 0900) Pulse Rate:  [53-105] 53 (11/01 0800) Cardiac Rhythm: Atrial fibrillation (11/01 0800) Resp:  [13-28] 15 (11/01 0800) BP: (104-170)/(62-89) 146/69 (11/01 0800) SpO2:  [92 %-100 %] 98 % (11/01 0800) FiO2 (%):  [40 %-50 %] 40 % (11/01 0800) Weight:  [218 lb 4.1 oz (99 kg)] 218 lb 4.1 oz (99 kg) (11/01 0416)  Hemodynamic parameters for last 24 hours:    Intake/Output from previous day: 10/31 0701 - 11/01 0700 In: 3510.1 [I.V.:2600.1; NG/GT:810; IV Piggyback:100] Out: 3160 [Urine:1810; Emesis/NG output:550; Stool:800] Intake/Output this shift: Total I/O In: 158 [I.V.:108; NG/GT:50] Out: 190 [Urine:190]  General appearance: alert, cooperative and no distress Heart: irregularly irregular rhythm Lungs: clear to auscultation bilaterally, some rhonchi in right lower lobe Abdomen: soft, non-tender; bowel sounds normal; no masses,  no organomegaly Extremities: extremities normal, atraumatic, no cyanosis or edema Wound: c/d/i without drainage  Lab Results:  Recent Labs  11/09/15 0402 11/10/15 0441  WBC 19.9* 18.9*  HGB 8.5* 8.4*  HCT 27.4* 27.8*  PLT 478* 500*   BMET:  Recent Labs  11/09/15 0402 11/10/15 0441  NA 154* 156*  K 3.4* 3.2*  CL 119* 122*  CO2 30 29  GLUCOSE 188* 174*  BUN 58* 48*  CREATININE 1.22 1.05  CALCIUM 8.3* 8.4*    PT/INR: No results for input(s): LABPROT, INR in the last 72  hours. ABG    Component Value Date/Time   PHART 7.381 11/04/2015 0330   HCO3 30.7 (H) 11/04/2015 0330   TCO2 31 11/05/2015 1803   ACIDBASEDEF 1.0 10/29/2015 1531   O2SAT 91.3 11/04/2015 0330   CBG (last 3)   Recent Labs  11/09/15 2326 11/10/15 0348 11/10/15 0904  GLUCAP 147* 135* 139*    Assessment/Plan: S/P Procedure(s) (LRB): CORONARY ARTERY BYPASS GRAFTING (CABG) x3(LIMA to LA, SVG to OM1, SVG to PDA) with EVH from the left thigh and partial lower leg greater saphenous vein and left internal mammary artery (N/A) TRANSESOPHAGEAL ECHOCARDIOGRAM (TEE) (N/A) INTRA-AORTIC BALLOON PUMP INSERTION size 40cc (Left)  1. CV-IV Cardizem for persistent atrial fibrillation started yesterday. Seems to have better rate control on IV Amio. Anticoagulation?  2. Pulm-Trach collar- tolerating about 4 hours at a time off the vent. CXR today: Stable bilateral perihilar and basilar edema. Improved left pleural effusion. Left basilar atelectasis or infiltrate cannot be excluded. Stable support apparatus. 3. Renal-Creatinine down to 1.05. Good urine output yesterday. Weight trending down. Remains hypernatremic, continue hydration and free water. Hypokalemia-IV potassium ordered 4. Endo-blood sugar well controlled on current regimen 5. ID- sputum cultures showed GPC, GNR, and a few GPR. Blood cultures still pending. Afebrile.      LOS: 16 days    Elgie Collard 11/10/2015   Making progress with pressure support weaning from ventilator White count slowly improving on vancomycin and aztreonam Tube feeds postpyloric with gastric tube  to decompress stomach from probable diabetic gastroparesis. Will remove gastric tube today and continue Reglan Check vancomycin level in a.m. We'll start Coumadin for persistent atrial fibrillation when his swallowing function returns--continue Lovenox 40 mg daily until then patient examined and medical record reviewed,agree with above note. Tharon Aquas Trigt  III 11/10/2015

## 2015-11-10 NOTE — Progress Notes (Signed)
PCCM PROGRESS NOTE   Admission date:  10/25/2015 Consult date:  10/27/2015 Referring provider:  Dr. Prescott Gum  CC:  Short of breath  Subjective: No acute events overnight. Patient did become slightly altered and attempted to disconnect his ventilator. Denies any pain or difficulty breathing this morning. Denies any nausea or abdominal pain this morning. Tolerating tube feedings.   Review of Systems:  Unable to obtain with patient on ventilator.   Vital signs: BP (!) 151/79   Pulse 97   Temp 98.8 F (37.1 C) (Oral)   Resp 20   Ht 5\' 6"  (1.676 m)   Wt 218 lb 4.1 oz (99 kg)   SpO2 95%   BMI 35.23 kg/m    Intake/output: I/O last 3 completed shifts: In: 5627.1 [I.V.:3887.1; NG/GT:1240; IV Piggyback:500] Out: [id number] [Urine:2865; Emesis/NG output:1100; Stool:1000]  Hemodynamics:    Vent settings: Vent Mode: PCV FiO2 (%):  [40 %-50 %] 40 % Set Rate:  [16 bmp] 16 bmp PEEP:  [5 cmH20] 5 cmH20 Plateau Pressure:  [15 cmH20] 15 cmH20  Physcial Exam: General:  Awake. No family at bedside. Nurse and respiratory therapy at bedside. Integument:  Warm & dry. No rash on exposed skin. Sternal incision clean, dry, and intact. HEENT: NG tube within right nare. No scleral icterus. Tracheostomy in place. Cardiovascular: Irregularly irregular rhythm. Atrial fibrillation on telemetry. Mild tachycardia on telemetry. Mild anasarca. No appreciable JVD. Pulmonary:  Normal work of breathing on tracheostomy collar. Clear on auscultation anteriorly with slightly decreased breath sounds in bases. Patient able to phonate with cuff inflated. Abdomen: Soft. Hyperactive bowel sounds. Protuberant. Nontender. Musculoskeletal:  No joint deformity or effusion appreciated. Neurological:  Nods appropriately & wiggles toes on command. Grossly nonfocal.  LABS: CMP Latest Ref Rng & Units 11/10/2015 11/09/2015 11/08/2015  Glucose 65 - 99 mg/dL 174(H) 188(H) 106(H)  BUN 6 - 20 mg/dL 48(H) 58(H) 68(H)  Creatinine  0.61 - 1.24 mg/dL 1.05 1.22 1.30(H)  Sodium 135 - 145 mmol/L 156(H) 154(H) 157(H)  Potassium 3.5 - 5.1 mmol/L 3.2(L) 3.4(L) 3.8  Chloride 101 - 111 mmol/L 122(H) 119(H) 119(H)  CO2 22 - 32 mmol/L 29 30 33(H)  Calcium 8.9 - 10.3 mg/dL 8.4(L) 8.3(L) 8.5(L)  Total Protein 6.5 - 8.1 g/dL 5.5(L) 5.7(L) 6.0(L)  Total Bilirubin 0.3 - 1.2 mg/dL 0.8 0.8 0.7  Alkaline Phos 38 - 126 U/L 63 64 67  AST 15 - 41 U/L 38 38 33  ALT 17 - 63 U/L 26 27 26     CBC Latest Ref Rng & Units 11/10/2015 11/09/2015 11/08/2015  WBC 4.0 - 10.5 K/uL 18.9(H) 19.9(H) 19.7(H)  Hemoglobin 13.0 - 17.0 g/dL 8.4(L) 8.5(L) 8.9(L)  Hematocrit 39.0 - 52.0 % 27.8(L) 27.4(L) 30.0(L)  Platelets 150 - 400 K/uL 500(H) 478(H) 507(H)    ABG    Component Value Date/Time   PHART 7.381 11/04/2015 0330   PCO2ART 52.9 (H) 11/04/2015 0330   PO2ART 65.5 (L) 11/04/2015 0330   HCO3 30.7 (H) 11/04/2015 0330   TCO2 31 11/05/2015 1803   ACIDBASEDEF 1.0 10/29/2015 1531   O2SAT 91.3 11/04/2015 0330    CBG (last 3)   Recent Labs  11/09/15 1914 11/09/15 2326 11/10/15 0348  GLUCAP 129* 147* 135*    Imaging: Ct Abdomen Pelvis Wo Contrast  Result Date: 11/08/2015 CLINICAL DATA:  Leukocytosis, evaluate for source of sepsis. EXAM: CT CHEST, ABDOMEN AND PELVIS WITHOUT CONTRAST TECHNIQUE: Multidetector CT imaging of the chest, abdomen and pelvis was performed following the standard protocol without IV  contrast. COMPARISON:  None. FINDINGS: CT CHEST FINDINGS Cardiovascular: Right PICC tip is in the right atrium. Atherosclerotic calcification of the arterial vasculature. Heart is enlarged. No pericardial effusion. Mediastinum/Nodes: Subcarinal lymph node measures 1.8 cm. Mediastinal lymph nodes are otherwise sub cm in short axis size. Hilar regions are difficult to definitively evaluate without IV contrast. No axillary adenopathy. Feeding tube traverses the esophagus. Lungs/Pleura: Image quality is degraded by respiratory motion. Somewhat  basilar dependent septal thickening, ground-glass and mild nodularity, likely superimposed on centrilobular emphysema. Small left pleural effusion with patchy consolidation in the left lower lobe. Tiny right pleural effusion with scattered patchy consolidation in the right lower lobe. Calcified pleural plaques bilaterally. Musculoskeletal: No worrisome lytic or sclerotic lesions. Degenerative changes are seen in the spine. CT ABDOMEN PELVIS FINDINGS Hepatobiliary: Liver and gallbladder are unremarkable. No biliary ductal dilatation. Pancreas: Negative. Spleen: Negative. Adrenals/Urinary Tract: Adrenal glands are unremarkable. There may be punctate stones versus vascular calcifications in the right kidney. Probable vascular calcification in the left kidney. Ureters are decompressed. Foley catheter and air are seen in the bladder. Stomach/Bowel: Feeding tube traverses stomach, terminating in the horizontal portion of the duodenum. Small bowel and colon are otherwise unremarkable. A catheter balloon is inflated in the rectum. Vascular/Lymphatic: Atherosclerotic calcification of the arterial vasculature without abdominal aortic aneurysm. No pathologically enlarged lymph nodes. Reproductive: Prostate is visualized. Other: Presacral edema. No free fluid. Tiny periumbilical hernia contains fat. Musculoskeletal: No worrisome lytic or sclerotic lesions. Degenerative changes are seen in the spine and sacroiliac joints. IMPRESSION: 1. Probable congestive heart failure. Patchy consolidation in both lower lobes, left greater than right, worrisome for superimposed pneumonia. 2.  Aortic atherosclerosis (ICD10-170.0). 3. Enlarged subcarinal lymph node, likely reactive. 4. Asbestos related pleural disease. Electronically Signed   By: Lorin Picket M.D.   On: 11/08/2015 16:36   Ct Chest Wo Contrast  Result Date: 11/08/2015 CLINICAL DATA:  Leukocytosis, evaluate for source of sepsis. EXAM: CT CHEST, ABDOMEN AND PELVIS WITHOUT  CONTRAST TECHNIQUE: Multidetector CT imaging of the chest, abdomen and pelvis was performed following the standard protocol without IV contrast. COMPARISON:  None. FINDINGS: CT CHEST FINDINGS Cardiovascular: Right PICC tip is in the right atrium. Atherosclerotic calcification of the arterial vasculature. Heart is enlarged. No pericardial effusion. Mediastinum/Nodes: Subcarinal lymph node measures 1.8 cm. Mediastinal lymph nodes are otherwise sub cm in short axis size. Hilar regions are difficult to definitively evaluate without IV contrast. No axillary adenopathy. Feeding tube traverses the esophagus. Lungs/Pleura: Image quality is degraded by respiratory motion. Somewhat basilar dependent septal thickening, ground-glass and mild nodularity, likely superimposed on centrilobular emphysema. Small left pleural effusion with patchy consolidation in the left lower lobe. Tiny right pleural effusion with scattered patchy consolidation in the right lower lobe. Calcified pleural plaques bilaterally. Musculoskeletal: No worrisome lytic or sclerotic lesions. Degenerative changes are seen in the spine. CT ABDOMEN PELVIS FINDINGS Hepatobiliary: Liver and gallbladder are unremarkable. No biliary ductal dilatation. Pancreas: Negative. Spleen: Negative. Adrenals/Urinary Tract: Adrenal glands are unremarkable. There may be punctate stones versus vascular calcifications in the right kidney. Probable vascular calcification in the left kidney. Ureters are decompressed. Foley catheter and air are seen in the bladder. Stomach/Bowel: Feeding tube traverses stomach, terminating in the horizontal portion of the duodenum. Small bowel and colon are otherwise unremarkable. A catheter balloon is inflated in the rectum. Vascular/Lymphatic: Atherosclerotic calcification of the arterial vasculature without abdominal aortic aneurysm. No pathologically enlarged lymph nodes. Reproductive: Prostate is visualized. Other: Presacral edema. No free fluid.  Tiny  periumbilical hernia contains fat. Musculoskeletal: No worrisome lytic or sclerotic lesions. Degenerative changes are seen in the spine and sacroiliac joints. IMPRESSION: 1. Probable congestive heart failure. Patchy consolidation in both lower lobes, left greater than right, worrisome for superimposed pneumonia. 2.  Aortic atherosclerosis (ICD10-170.0). 3. Enlarged subcarinal lymph node, likely reactive. 4. Asbestos related pleural disease. Electronically Signed   By: Lorin Picket M.D.   On: 11/08/2015 16:36   Dg Chest Port 1 View  Result Date: 11/09/2015 CLINICAL DATA:  Status post CABG on July 26, 2015, tracheostomy patient. EXAM: PORTABLE CHEST 1 VIEW COMPARISON:  Portable chest x-ray and chest CT scan of November 08, 2015 FINDINGS: Worsening of interstitial and early alveolar opacities on the right has occurred. Fairly stable density is present in the left perihilar region. The retrocardiac region on the left remains dense and the hemidiaphragm obscured. The cardiac silhouette remains mildly enlarged. The central pulmonary vascularity remains engorged. The mediastinum is normal in width. There is calcification in the wall of the aortic arch. The tracheostomy appliance tip lies at the level of the clavicular heads. The feeding tube and esophagogastric tube tips project below the inferior margin of the image. The PICC line tip on the right projects over the proximal SVC. IMPRESSION: Interval worsening of interstitial and alveolar opacities on the right worrisome for pneumonia. Stable left basilar atelectasis and small left pleural effusion. Stable cardiomegaly with mild central pulmonary vascular congestion. Electronically Signed   By: David  Martinique M.D.   On: 11/09/2015 08:38   Dg Chest Port 1 View  Result Date: 11/08/2015 CLINICAL DATA:  PICC line placement EXAM: PORTABLE CHEST 1 VIEW COMPARISON:  11/08/2015 FINDINGS: Cardiomegaly again noted. Status post median sternotomy. Stable tracheostomy  tube position. Central mild vascular congestion and mild interstitial prominence suspicious for mild interstitial edema. A there is right arm PICC line with tip in distal SVC. No pneumothorax. IMPRESSION: Right PICC line with tip in SVC. No pneumothorax. Persistent mild congestion/ edema. Electronically Signed   By: Lahoma Crocker M.D.   On: 11/08/2015 11:12    Studies:  Chest [place] 10/26: moderate effusion rt with significant compressive atx, small eff to mod left CT Chest/Abd/Pelvis W/O 10/30: IMPRESSION: 1. Probable congestive heart failure. Patchy consolidation in both lower lobes, left greater than right, worrisome for superimposed pneumonia. 2.  Aortic atherosclerosis (ICD10-170.0). 3. Enlarged subcarinal lymph node, likely reactive. 4. Asbestos related pleural disease. TTE 10/31 >>  Microbiology: MRSA PCR 10/18:  Negative  Tracheal Asp Ctx 10/18:  Oral Flora Blood Ctx x2 10/18:  Negative Tracheal Asp Ctx 10/19:  Oral Flora Tracheal Asp Ctx 10/24:  Oral Flora Blood Ctx 10/24:  1/2 Bottles MRSE Blood Ctx 10/26: 1/2 Bottles Coag Neg Staph  C diff 10/28:  Negative  Right Pleural Effusion 10/27 >> Tracheal Asp Ctx 10/30 >> Blood Ctx x2 10/31 >>  Antibiotics: Levaquin 10/17 - 10/24 Aztreonam 10/24 - 10/29 Vancomycin 10/17 - 10/18; 10/25 >>  Significant Events: 10/17 - Admit, cardiac cath, emergent CABG due to NSTEMI. 10/18 - 0600 PCCM consulted for refractory hypoxia 10/25 - reintubated for increased WOB and hypoxia.  10/26 - trached by DF  Lines/Tubes: OETT 10/17 - 10/26 L Rad Art Line 10/17 - 10/26 R IJ PA Cat 10/17 - out CT x3 10/17 - last removed 10/29 Trach (DF) 10/26 >> RUE DL PICC 10/30 >> R NGT 10/26 >> Foley 10/17 >> PIV x1  Assessment/Plan:  81 y.o. male presented with increasing dyspnea and a fib with RVR.  Found to have 99% Lt main CAD and had emergent CABG 10/17. Patient continues to have elevated white blood cell count without fever. He is tolerating his  tracheostomy collar trials and we will attempt to stretch them out while doing PMV today. Increasing free water for hypernatremia. Defer management of his atrial fibrillation to Dr. Prescott Gum. May be showing some very mild and early signs of delirium at night.  1. Acute Hypoxic Respiratory Failure:  S/P Trach for vocal chord edema & failure to wean. Continuing T-collar trials today and stretch out duration with full vent for rest. Speech therapy consulted 10/31 for [facility] valve. 2. Sepsis:  Unclear etiology. Leukocytosis stable & procalcitonin reassuring. Continuing Empiric Vancomycin. Awaiting tracheal aspirate, pleural fluid, and blood culture results. 3. Possible HCAP:  Questionable. S/P 5 days of Aztreonam & ongoing treatment with Vancomycin Day #8. 4. Possible MRSE Bacteremia: Continuing empiric Vancomycin. Awaiting repeat transthoracic echocardiogram result. Repeating blood cultures performed 10/31. 5. Possible Early Delirium/Sundowning:  Plan to use Precedex if this becomes more of an issue. 6. NSTEMI:  S/P CABG on 10/17. Management per Dr. Prescott Gum. ASA 325mg  daily & Lipitor qhs. 7. Atrial Fibrillation w/ RVR: Management per Dr. Prescott Gum. Lopressor bid & Diltiazem drip. 8. Acute on Chronic Renal Failure Stage II:  Resolved. Monitoring renal function and electrolytes daily.  9. Acute Encephalopathy:  Resolving. Likely multifactorial with a contribution of toxic metabolic. Trying to avoid further sedating medications.  10. Hypernatremia:  Stable. 5.7L Free water deficit this morning. Last dose of Lasix 10/29. Continuing D5W @ 100cc/hr & increasing Free Water to 200cc q4hr via tube. 11. Hyperchloremia:  Worsening. Continuing to hold Lasix. Trending electrolytes daily.  12. Hypokalemia:  Replaced with KCl 29mEq IV x3 doses. Daily KCl 39mEq VT. Repeat electrolytes in AM 11/2.  13. Metabolic Alkalosis:  Resolved. Likely come element of contraction & compensation.  14. Nausea w/ Emesis:   Currently on Reglan scheduled. Has Zofran IV as needed. Now resolved. 15. Diarrhea:  C diff negative 10/28. No evidence of worsening. 16. Diabetes Mellitus Type II:  Glucose controlled. Continuing SSI per algorithm with accu-checks q4hr. Continuing to hold Levemir. 17. Chronic Diastolic Congestive Heart Failure:  Grade 2 dysfunction on 10/24 echocardiogram. Continuing ASA 325mg , Lipitor 80mg , & Lopressor bid. Holding home Coreg, Cozaar, Imdur, Hydralazine, Fenofibrate, Zocor, Niacin, & Lasix. 18. H/O Moderate COPD:  No signs of exacerbation. Continuing Xopenex nebs tid & Pulmicort neb bid. 19. H/O GERD:  Continuing Pepcid IV q12hr.  20. Anemia:  No signs of active bleeding. Hgb stable. Continuing to trend cell counts daily w/ CBC. 21. Cardiogenic Shock:  Resolved. 22. Diet:  Continuing & tolerating tube feedings. 23. Prophylaxis:  Lovenox [place] q24hr, Pepcid IV q12hr, & SCDs.  I have spent a total of 31 minutes of critical care time today caring for the patient  and reviewing the patient's electronic medical record.   Sonia Baller Ashok Cordia, M.D. [facility]monary [facility] Critical [facility] Pager:  [phone] After 3pm or if no response, call [phone] 11/10/2015 7:57 AM

## 2015-11-10 NOTE — Care Management Note (Signed)
Case Management Note  Patient Details  Name: Mark Rivers MRN: [id number] Date of Birth: November 25, 1934  Subjective/Objective: 81 y.o. M who is the primary Caregiver for his spouse who has Dementia, admitted 10/25/2015. CABG 10/17. Remains on Vent (trach 10/26 ). Tube Feedings/NG to suction/ IV Cardizem. Spoke to Daughter Mark Rivers,  at bedside who is supportive and here from [place]. Another daughter also available from Out of state. Brother Mark Rivers from [place] also at bedside.  Action/Plan:Plan is SNF at discharge.                   Expected Discharge Date:                  Expected Discharge Plan:  Skilled Nursing Facility  In-House Referral:  Clinical Social Work  Discharge planning Services  CM Consult  Post Acute Care Choice:  NA Choice offered to:  Patient, Adult Children, Sibling  DME Arranged:    DME Agency:     HH Arranged:    [place] Agency:     Status of Service:  In process, will continue to follow  If discussed at Long Length of Stay Meetings, dates discussed:    Additional Comments:  [name], RN 11/10/2015, 10:44 AM

## 2015-11-10 NOTE — Progress Notes (Signed)
[facility] BRIEF SICU PROGRESS NOTE  15 Days Post-Op  S/P Procedure(s) (LRB): CORONARY ARTERY BYPASS GRAFTING (CABG) x3(LIMA to LA, SVG to OM1, SVG to PDA) with EVH from the left thigh and partial lower leg greater saphenous vein and left internal mammary artery (N/A) TRANSESOPHAGEAL ECHOCARDIOGRAM (TEE) (N/A) INTRA-AORTIC BALLOON PUMP INSERTION size 40cc (Left)   Stable day Afib w/ HR 90's and stable BP Breathing comfortably on trach collar UOP adequate  Plan: Continue current plan  Rexene Alberts, MD 11/10/2015 6:46 PM

## 2015-11-10 NOTE — Evaluation (Signed)
[facility] Speaking Valve - Evaluation Patient Details  Name: Mark Rivers MRN: [id number] Date of Birth: 1934-04-07  Today's Date: 11/10/2015 Time: [id number] SLP Time Calculation (min) (ACUTE ONLY): 38 min  Past Medical History:  Past Medical History:  Diagnosis Date  . Allergy   . Back pain   . Coronary artery disease   . Diabetes mellitus   . GERD (gastroesophageal reflux disease)   . Hypertension   . Neuropathy [facility])    Past Surgical History:  Past Surgical History:  Procedure Laterality Date  . CARDIAC CATHETERIZATION N/A 10/26/2015   Procedure: Left Heart Cath and Coronary Angiography;  Surgeon: Troy Sine, MD;  Location: [facility];  Service: Cardiovascular;  Laterality: N/A;  . cataract surg     bil/2008  . COLONOSCOPY    . CORONARY ARTERY BYPASS GRAFT N/A 10/26/2015   Procedure: CORONARY ARTERY BYPASS GRAFTING (CABG) x3(LIMA to LA, SVG to OM1, SVG to PDA) with EVH from the left thigh and partial lower leg greater saphenous vein and left internal mammary artery;  Surgeon: Ivin Poot, MD;  Location: [facility];  Service: Open Heart Surgery;  Laterality: N/A;  . CORONARY STENT PLACEMENT     1988  . TEE WITHOUT CARDIOVERSION N/A 10/26/2015   Procedure: TRANSESOPHAGEAL ECHOCARDIOGRAM (TEE);  Surgeon: Ivin Poot, MD;  Location: [facility];  Service: Open Heart Surgery;  Laterality: N/A;  . TONSILLECTOMY     HPI:  81 y.o.male presented with increasing dyspnea and a fib with RVR. Found to have 99% Lt main CAD and had emergent CABG 10/17. He remained intubated 10/17-10/26 until trach placed. PMH includes CAD, HTN, GERD, DM   Assessment / Plan / Recommendation Clinical Impression  Pt tolerated the PMV very well for 30 minutes under direct SLP supervision. Phonation is audible and clear, with good intelligibility of speech at the conversational level. Pt is very conversant although with intermittent word-finding difficulties and confusion. Recommend to continue  use of the valve during waking hours with close staff supervision. RN aware. Will continue to follow for tolerance. MD may wish to order swallowing evaluation when medically ready.    SLP Assessment  Patient needs continued Speech Lanaguage Pathology Services    Follow Up Recommendations   (tba)    Frequency and Duration min 2x/week  2 weeks    PMSV Trial PMSV was placed for: 30 min Able to redirect subglottic air through upper airway: Yes Able to Attain Phonation: Yes Voice Quality: Normal Able to Expectorate Secretions: No attempts Level of Secretion Expectoration with PMSV: Not observed Breath Support for Phonation: Adequate Intelligibility: Intelligible Respirations During Trial: 20 SpO2 During Trial: 91 % Pulse During Trial: 80 Behavior: Alert;Confused;Cooperative;Responsive to questions   Tracheostomy Tube       Vent Dependency  FiO2 (%): 40 %    Cuff Deflation Trial  GO          Mark Rivers 11/10/2015, 4:45 PM  Mark Rivers, M.A. CCC-SLP [phone]

## 2015-11-11 ENCOUNTER — Inpatient Hospital Stay (HOSPITAL_COMMUNITY): Payer: Medicare Other

## 2015-11-11 DIAGNOSIS — R410 Disorientation, unspecified: Secondary | ICD-10-CM

## 2015-11-11 LAB — COMPREHENSIVE METABOLIC PANEL
ALT: 32 U/L (ref 17–63)
AST: 49 U/L — ABNORMAL HIGH (ref 15–41)
Albumin: 2.1 g/dL — ABNORMAL LOW (ref 3.5–5.0)
Alkaline Phosphatase: 60 U/L (ref 38–126)
Anion gap: 5 (ref 5–15)
BUN: 43 mg/dL — ABNORMAL HIGH (ref 6–20)
CO2: 27 mmol/L (ref 22–32)
Calcium: 8 mg/dL — ABNORMAL LOW (ref 8.9–10.3)
Chloride: 120 mmol/L — ABNORMAL HIGH (ref 101–111)
Creatinine, Ser: 1.1 mg/dL (ref 0.61–1.24)
GFR calc Af Amer: >60 mL/min (ref >60)
GFR calc non Af Amer: >60 mL/min (ref >60)
Glucose, Bld: 140 mg/dL — ABNORMAL HIGH (ref 65–99)
Potassium: 3.4 mmol/L — ABNORMAL LOW (ref 3.5–5.1)
Sodium: 152 mmol/L — ABNORMAL HIGH (ref 135–145)
Total Bilirubin: 0.8 mg/dL (ref 0.3–1.2)
Total Protein: 5.1 g/dL — ABNORMAL LOW (ref 6.5–8.1)

## 2015-11-11 LAB — CBC
HCT: 25.7 % — ABNORMAL LOW (ref 39.0–52.0)
Hemoglobin: 7.8 g/dL — ABNORMAL LOW (ref 13.0–17.0)
MCH: 30.7 pg (ref 26.0–34.0)
MCHC: 30.4 g/dL (ref 30.0–36.0)
MCV: 101.2 fL — ABNORMAL HIGH (ref 78.0–100.0)
Platelets: 434 10*3/uL — ABNORMAL HIGH (ref 150–400)
RBC: 2.54 MIL/uL — ABNORMAL LOW (ref 4.22–5.81)
RDW: 17 % — ABNORMAL HIGH (ref 11.5–15.5)
WBC: 16.3 10*3/uL — ABNORMAL HIGH (ref 4.0–10.5)

## 2015-11-11 LAB — GLUCOSE, CAPILLARY
Glucose-Capillary: 138 mg/dL — ABNORMAL HIGH (ref 65–99)
Glucose-Capillary: 129 mg/dL — ABNORMAL HIGH (ref 65–99)
Glucose-Capillary: 148 mg/dL — ABNORMAL HIGH (ref 65–99)
Glucose-Capillary: 155 mg/dL — ABNORMAL HIGH (ref 65–99)
Glucose-Capillary: 138 mg/dL — ABNORMAL HIGH (ref 65–99)
Glucose-Capillary: 151 mg/dL — ABNORMAL HIGH (ref 65–99)

## 2015-11-11 LAB — VANCOMYCIN, TROUGH: Vancomycin Tr: 14 ug/mL — ABNORMAL LOW (ref 15–20)

## 2015-11-11 LAB — MAGNESIUM: MAGNESIUM: 2.5 mg/dL — AB (ref 1.7–2.4)

## 2015-11-11 MED ORDER — FUROSEMIDE 10 MG/ML IJ SOLN
40.0000 mg | Freq: Once | INTRAMUSCULAR | Status: AC
  Administered 2015-11-11: 40 mg via INTRAVENOUS
  Filled 2015-11-11: qty 4

## 2015-11-11 MED ORDER — DIGOXIN 0.25 MG/ML IJ SOLN
0.1250 mg | Freq: Every day | INTRAMUSCULAR | Status: DC
Start: 2015-11-12 — End: 2015-11-18
  Administered 2015-11-12 – 2015-11-18 (×7): 0.125 mg via INTRAVENOUS
  Filled 2015-11-11 (×7): qty 2

## 2015-11-11 MED ORDER — POTASSIUM CHLORIDE 10 MEQ/50ML IV SOLN
10.0000 meq | INTRAVENOUS | Status: AC
  Administered 2015-11-11 (×3): 10 meq via INTRAVENOUS
  Filled 2015-11-11 (×3): qty 50

## 2015-11-11 MED ORDER — METOCLOPRAMIDE HCL 5 MG/ML IJ SOLN
5.0000 mg | Freq: Four times a day (QID) | INTRAMUSCULAR | Status: DC
  Administered 2015-11-11: 5 mg via INTRAVENOUS
  Filled 2015-11-11: qty 2

## 2015-11-11 MED ORDER — FAMOTIDINE 40 MG/5ML PO SUSR
20.0000 mg | Freq: Every day | ORAL | Status: DC
  Administered 2015-11-11 – 2015-11-26 (×16): 20 mg
  Filled 2015-11-11 (×18): qty 2.5

## 2015-11-11 MED ORDER — DILTIAZEM HCL 100 MG IV SOLR
10.0000 mg/h | INTRAVENOUS | Status: DC
  Administered 2015-11-11 – 2015-11-14 (×7): 10 mg/h via INTRAVENOUS
  Filled 2015-11-11 (×9): qty 100

## 2015-11-11 MED ORDER — FUROSEMIDE 10 MG/ML IJ SOLN: 20.0000 mg | Freq: Every day | INTRAMUSCULAR | Status: DC

## 2015-11-11 MED ORDER — ALPRAZOLAM 0.5 MG PO TABS
0.5000 mg | ORAL_TABLET | Freq: Once | ORAL | Status: AC
Start: 2015-11-11 — End: 2015-11-11
  Administered 2015-11-11: 0.5 mg
  Filled 2015-11-11: qty 1

## 2015-11-11 MED ORDER — QUETIAPINE FUMARATE 25 MG PO TABS
50.0000 mg | ORAL_TABLET | Freq: Every day | ORAL | Status: DC
Start: 2015-11-11 — End: 2015-11-12
  Administered 2015-11-11: 50 mg via ORAL
  Filled 2015-11-11: qty 2

## 2015-11-11 MED ORDER — FREE WATER
50.0000 mL | Status: DC
  Administered 2015-11-11 – 2015-11-16 (×122): 50 mL

## 2015-11-11 MED ORDER — DIGOXIN 0.25 MG/ML IJ SOLN
0.2500 mg | Freq: Once | INTRAMUSCULAR | Status: AC
  Administered 2015-11-11: 0.25 mg via INTRAVENOUS
  Filled 2015-11-11: qty 2

## 2015-11-11 MED ORDER — VANCOMYCIN HCL IN DEXTROSE 1-5 GM/200ML-% IV SOLN
1000.0000 mg | Freq: Two times a day (BID) | INTRAVENOUS | Status: DC
  Administered 2015-11-11 – 2015-11-13 (×6): 1000 mg via INTRAVENOUS
  Filled 2015-11-11 (×9): qty 200

## 2015-11-11 MED ORDER — DILTIAZEM HCL-DEXTROSE 100-5 MG/100ML-% IV SOLN (PREMIX): 10.0000 mg | INTRAVENOUS | Status: DC

## 2015-11-11 MED ORDER — HALOPERIDOL LACTATE 5 MG/ML IJ SOLN
1.0000 mg | INTRAMUSCULAR | Status: DC | PRN
  Administered 2015-11-11: 1 mg via INTRAVENOUS
  Administered 2015-11-11 – 2015-11-12 (×2): 2 mg via INTRAVENOUS
  Filled 2015-11-11 (×3): qty 1

## 2015-11-11 NOTE — Progress Notes (Signed)
16 Days Post-Op Procedure(s) (LRB): CORONARY ARTERY BYPASS GRAFTING (CABG) x3(LIMA to LA, SVG to OM1, SVG to PDA) with EVH from the left thigh and partial lower leg greater saphenous vein and left internal mammary artery (N/A) TRANSESOPHAGEAL ECHOCARDIOGRAM (TEE) (N/A) INTRA-AORTIC BALLOON PUMP INSERTION size 40cc (Left) Subjective: Out of bed to chair with assistance of physical therapy Increased airway secretions, thin-treated with one dose IV Lasix Sputum cultures remain negative, white count continues to improve on empiric antibiotics for possible pneumonia Atrial fibrillation probable chronic-will need Coumadin once he is able to tolerate by mouth diet Renal function stable On tube feeds  Objective: Vital signs in last 24 hours: Temp:  [97.3 F (36.3 C)-98.7 F (37.1 C)] 98.3 F (36.8 C) (11/02 1612) Pulse Rate:  [41-110] 89 (11/02 1955) Cardiac Rhythm: Atrial fibrillation (11/02 1600) Resp:  [16-37] 24 (11/02 1955) BP: (120-165)/(63-137) 147/83 (11/02 1955) SpO2:  [91 %-100 %] 97 % (11/02 1955) FiO2 (%):  [40 %] 40 % (11/02 1955) Weight:  [218 lb 7.6 oz (99.1 kg)] 218 lb 7.6 oz (99.1 kg) (11/02 0500)  Hemodynamic parameters for last 24 hours:    Intake/Output from previous day: 11/01 0701 - 11/02 0700 In: 3882.8 [I.V.:1860.8; [id number]; IV Piggyback:600] Out: 2675 [Urine:1825; Emesis/NG output:200; Stool:650] Intake/Output this shift: No intake/output data recorded.       Exam    General- alert and comfortable   Lungs- clear without rales, wheezes   Cor- regular rate and rhythm, no murmur , gallop   Abdomen- soft, non-tender   Extremities - warm, non-tender, minimal edema   Neuro- oriented, appropriate, no focal weakness   Lab Results:  Recent Labs  11/10/15 0441 11/11/15 0340  WBC 18.9* 16.3*  HGB 8.4* 7.8*  HCT 27.8* 25.7*  PLT 500* 434*   BMET:  Recent Labs  11/10/15 0441 11/11/15 0340  NA 156* 152*  K 3.2* 3.4*  CL 122* 120*  CO2 29 27   GLUCOSE 174* 140*  BUN 48* 43*  CREATININE 1.05 1.10  CALCIUM 8.4* 8.0*    PT/INR: No results for input(s): LABPROT, INR in the last 72 hours. ABG    Component Value Date/Time   PHART 7.381 11/04/2015 0330   HCO3 30.7 (H) 11/04/2015 0330   TCO2 31 11/05/2015 1803   ACIDBASEDEF 1.0 10/29/2015 1531   O2SAT 91.3 11/04/2015 0330   CBG (last 3)   Recent Labs  11/11/15 0750 11/11/15 1220 11/11/15 1610  GLUCAP 129* 148* 155*    Assessment/Plan: S/P Procedure(s) (LRB): CORONARY ARTERY BYPASS GRAFTING (CABG) x3(LIMA to LA, SVG to OM1, SVG to PDA) with EVH from the left thigh and partial lower leg greater saphenous vein and left internal mammary artery (N/A) TRANSESOPHAGEAL ECHOCARDIOGRAM (TEE) (N/A) INTRA-AORTIC BALLOON PUMP INSERTION size 40cc (Left) Continue vent wean Continue mobilization Patient will probably need daily dose of Lasix now that his renal function has stabilized and serum sodium levels improved  LOS: 17 days    Mark Rivers 11/11/2015

## 2015-11-11 NOTE — Progress Notes (Signed)
Pt placed back on full vent support due to increased RR and WOB.  RN aware.

## 2015-11-11 NOTE — Progress Notes (Addendum)
Pharmacy Antibiotic Note  Nigel Buchwald is a 81 y.o. male admitted on 10/25/2015 with pneumonia and MRSE bacteremia.  Pharmacy has been consulted for vancomcyin dosing. PICC has been removed.   Vancomycin level is checked before reaching steady-state of new regimen and it slightly below goal.  PharmD progress note documented that vancomycin dose was infusing on 11/09/15 before the dose increase; however, nothing was charted as given.  Patient's renal function is stabilizing.   Plan: - Increase vanc slightly to 1000mg  IV Q12H - Monitor renal fxn, clinical progress, repeat vanc trough early next week - F/U abx LOT - ?need AC    Height: 5\' 6"  (167.6 cm) Weight: 218 lb 7.6 oz (99.1 kg) IBW/kg (Calculated) : 63.8  Temp (24hrs), Avg:98.4 F (36.9 C), Min:97.7 F (36.5 C), Max:98.7 F (37.1 C)   Recent Labs Lab 11/07/15 0722 11/08/15 0305 11/09/15 0402 11/09/15 2130 11/10/15 0441 11/11/15 0340 11/11/15 0945  WBC 15.3* 19.7* 19.9*  --  18.9* 16.3*  --   CREATININE 1.32* 1.30* 1.22  --  1.05 1.10  --   VANCOTROUGH  --   --   --  12*  --   --  14*    Estimated Creatinine Clearance: 60 mL/min (by C-G formula based on SCr of 1.1 mg/dL).    Allergies  Allergen Reactions  . Amoxicillin Other (See Comments)    PATIENT PASSES OUT!!!!  . Cephalexin Other (See Comments)    PATIENT PASSES OUT!!!!  . Penicillin G Other (See Comments)    Has patient had a PCN reaction causing immediate rash, facial/tongue/throat swelling, SOB or lightheadedness with hypotension: Yes Has patient had a PCN reaction causing severe rash involving mucus membranes or skin necrosis: No Has patient had a PCN reaction that required hospitalization: Yes Has patient had a PCN reaction occurring within the last 10 years: Yes If all of the above answers are "NO", then may proceed with Cephalosporin use. PATIENT PASSES OUT!!!!   Antimicrobials this admission:  Levofloxacin 10/17>>10/23 Vancomycin  10/17>>10/18 Aztreonam 10/24>>10/30 Vancomycin 10/25>>  Dose adjustments this admission:  10/31 VT = 12 on 1250 q24 >> 750mg  q12  11/2 VT = 14 mcg/mL 750mg  q12 (checked prior to 3rd dose) >> 1gm q12  Microbiology results:  10/18 MRSA PCR - negative 10/18, 10/19, 10/24, 10/30 TA - negative 10/18 Blood x 2: neg 10/24 BCx: CoNS 1 of 2 (BCID MRSE, Vanc MIC = 1) 10/26 BCx: CoNS 1 of 2 (BCID MRSE) 10/27 R pleural fluid - negative 10/28 Cdiff: negative 10/31 BCx x2 - NGTD   Jahmal Dunavant D. Mina Marble, PharmD, BCPS Pager:  [phone] 11/11/2015, 10:48 AM

## 2015-11-11 NOTE — Progress Notes (Signed)
Dr. Prescott Gum notified of increased work of breathing and frothy white secreations.  Order receive for 40mg  IV lasix for one dose.

## 2015-11-11 NOTE — Progress Notes (Signed)
eLink Physician-Brief Progress Note Patient Name: Gilles Hustead DOB: 07-06-1934 MRN: [id number]   Date of Service  11/11/2015  HPI/Events of Note  Restless, poorly cooperative, agitated, delirious  eICU Interventions  1) medications reviewed - metoclopramide and/or famotidine might be contributors to delirium. I have reduced dosages of both of the medications 2) PRN Haldol 3) Restraints per RN request     Intervention Category Major Interventions: Delirium, psychosis, severe agitation - evaluation and management  Wilhelmina Mcardle 11/11/2015, 4:17 AM

## 2015-11-11 NOTE — Evaluation (Signed)
Physical Therapy Evaluation Patient Details Name: Mark Rivers MRN: [id number] DOB: May 23, 1934 Today's Date: 11/11/2015   History of Present Illness  81 yo with admission for chest pain, Afib with RVR s/p CABGx3 PMhx: CAD, CKD, and COPD   Clinical Impression  Pt pleasant and attempting to speak around trach but not always able to clearly state. Pt unaware that he is at [facility] but aware of surgery. Pt with generalized weakness, fatigue, inability to stand fully and safely with 2 person max assist who will benefit from acute therapy to maximize mobility, independence, strength and adherence to precautions prior to D/C. Pt educated for all sternal precautions but unable to recall end of session. Pt with maintained anterior lean with attempt at standing, unable to erect trunk and LLE buckling with weight bearing with knee blocked throughout.   HR 112 BP 138/65 before 148/70 after 40% trach collar throughout with sats 94%    Follow Up Recommendations SNF;Supervision/Assistance - 24 hour    Equipment Recommendations  Rolling walker with 5" wheels;3in1 (PT)    Recommendations for Other Services OT consult;Speech consult     Precautions / Restrictions Precautions Precautions: Fall;Sternal Precaution Comments: trach, flexiseal, panda      Mobility  Bed Mobility               General bed mobility comments: in chair via maxi sky on arrival  Transfers Overall transfer level: Needs assistance   Transfers: Sit to/from Stand Sit to Stand: Max assist;+2 physical assistance;+2 safety/equipment         General transfer comment: attempted to stand x 4 with only 2x of being able to elevate sacrum from surface. Assist to place pt's feet under him, for anterior translation and rise from surface with max cueing and assist throughout. Total assist with chair fully reclined to scoot back in chair as unable to scoot or assist. Maxisky pad in place for transition to bed with  nursing  Ambulation/Gait             General Gait Details: unable  Stairs            Wheelchair Mobility    Modified Rankin (Stroke Patients Only)       Balance Overall balance assessment: Needs assistance   Sitting balance-Leahy Scale: Fair                                       Pertinent Vitals/Pain Pain Assessment: No/denies pain    Home Living Family/patient expects to be discharged to:: Skilled nursing facility Living Arrangements: Spouse/significant other Available Help at Discharge: Friend(s);Available PRN/intermittently Type of Home: House                Prior Function Level of Independence: Independent         Comments: pt was caring for himself and wife PTA     Hand Dominance        Extremity/Trunk Assessment   Upper Extremity Assessment: Generalized weakness           Lower Extremity Assessment: Generalized weakness      Cervical / Trunk Assessment: Kyphotic  Communication   Communication: Tracheostomy  Cognition Arousal/Alertness: Awake/alert Behavior During Therapy: WFL for tasks assessed/performed Overall Cognitive Status: Impaired/Different from baseline Area of Impairment: Orientation;Safety/judgement Orientation Level: Disoriented to;Time;Place   Memory: Decreased recall of precautions;Decreased short-term memory   Safety/Judgement: Decreased awareness of deficits  General Comments      Exercises     Assessment/Plan    PT Assessment Patient needs continued PT services  PT Problem List Decreased strength;Decreased mobility;Decreased safety awareness;Decreased activity tolerance;Cardiopulmonary status limiting activity;Decreased balance;Decreased knowledge of use of DME;Decreased knowledge of precautions          PT Treatment Interventions Gait training;Therapeutic exercise;DME instruction;Therapeutic activities;Patient/family education;Functional mobility training;Balance  training    PT Goals (Current goals can be found in the Care Plan section)  Acute Rehab PT Goals Patient Stated Goal: return home to wife PT Goal Formulation: With patient Time For Goal Achievement: 11/25/15 Potential to Achieve Goals: Fair    Frequency Min 3X/week   Barriers to discharge Decreased caregiver support      Co-evaluation               End of Session Equipment Utilized During Treatment: Gait belt;Oxygen Activity Tolerance: Patient tolerated treatment well Patient left: in chair;with call bell/phone within reach;with chair alarm set;with nursing/sitter in room Nurse Communication: Mobility status;Need for lift equipment;Precautions         Time: [id number] PT Time Calculation (min) (ACUTE ONLY): 22 min   Charges:   PT Evaluation $PT Eval High Complexity: 1 Procedure     PT G CodesMelford Aase 11/11/2015, 10:54 AM  Elwyn Reach, [place]

## 2015-11-11 NOTE — Progress Notes (Signed)
Speech Language Pathology Treatment: [name] Speaking valve  Patient Details Name: Mark Rivers MRN: [id number] DOB: 09-23-1934 Today's Date: 11/11/2015 Time: [id number] SLP Time Calculation (min) (ACUTE ONLY): 23 min  Assessment / Plan / Recommendation Clinical Impression   Pt's cuff was deflated upon SLP arrival. He tolerated PMV placement for 20 mins with stable VS. RN and chart review reported altered mentation and need for increased suctioning overnight with possible fatigue from prolonged TC trial. Pt had adequate breath support for phonation and was intelligible throughout session. Given mentation and increased secretions, recommend downgrading PMV usage to intermittently with staff under full supervision. Will continue to follow for PMV tolerance and usage.    HPI HPI: 82 y.o.male presented with increasing dyspnea and a fib with RVR. Found to have 99% Lt main CAD and had emergent CABG 10/17. He remained intubated 10/17-10/26 until trach placed. PMH includes CAD, HTN, GERD, DM      SLP Plan  Continue with current plan of care     Recommendations         Patient may use Passy-Muir Speech Valve: Intermittently with supervision;During all therapies with supervision PMSV Supervision: Full         Oral Care Recommendations: Oral care QID Follow up Recommendations: Skilled Nursing facility Plan: Continue with current plan of care       [place]akeline, Student SLP  Shela Leff 11/11/2015, 2:20 PM

## 2015-11-11 NOTE — Progress Notes (Signed)
PCCM PROGRESS NOTE   Admission date:  10/25/2015 Consult date:  10/27/2015 Referring provider:  Dr. Prescott Gum  CC:  Short of breath  Subjective: Patient more agitated and delirious overnight. Described as being restless. Dosages of Reglan & Pepcid were decreased. Haldol IV was added to patient's regimen. Nurse reports patient remains somewhat delirious. Patient still having some mild secretions. Did T-collar all day yesterday. Patient reports he does have some more dyspnea today compared with yesterday. Also reporting some chest wall pain from his surgery.   Review of Systems:  Denies any abdominal pain or nausea. Continues to have bowel movement. Does not have any headache.   Vital signs: BP (!) 147/95   Pulse (!) 103   Temp 98.6 F (37 C) (Oral)   Resp 17   Ht 5\' 6"  (1.676 m)   Wt 218 lb 7.6 oz (99.1 kg)   SpO2 97%   BMI 35.26 kg/m    Intake/output: I/O last 3 completed shifts: In: 5623.8 [I.V.:3171.8; NG/GT:1752; IV Piggyback:700] Out: [id number] [Urine:2820; Emesis/NG output:250; [id number]  Hemodynamics:    Vent settings: Vent Mode: PCV FiO2 (%):  [40 %] 40 % Set Rate:  [16 bmp] 16 bmp PEEP:  [5 cmH20] 5 cmH20  Physcial Exam: General:  Awake. No family at bedside. Nurse  at bedside. Integument:  Warm & dry. No rash on exposed skin. Sternal incision clean, dry, and intact. HEENT: NG tube within right nare. No scleral icterus. Tracheostomy in place. Cardiovascular: Irregularly irregular rhythm. Atrial fibrillation on telemetry. Mild edema in lower extremities. Pulmonary:  Mildly increased work of breathing on tracheostomy collar. Audible voice. Slightly coarse breath sounds in bases bilaterally. Abdomen: Soft. Normoactive bowel sounds. Protuberant. Nontender. Musculoskeletal:  No joint deformity or effusion appreciated. Neurological:  Moving all extremities equally. Oriented to person, year, place, and president.  LABS: CMP Latest Ref Rng & Units 11/11/2015 11/10/2015  11/09/2015  Glucose 65 - 99 mg/dL 140(H) 174(H) 188(H)  BUN 6 - 20 mg/dL 43(H) 48(H) 58(H)  Creatinine 0.61 - 1.24 mg/dL 1.10 1.05 1.22  Sodium 135 - 145 mmol/L 152(H) 156(H) 154(H)  Potassium 3.5 - 5.1 mmol/L 3.4(L) 3.2(L) 3.4(L)  Chloride 101 - 111 mmol/L 120(H) 122(H) 119(H)  CO2 22 - 32 mmol/L 27 29 30   Calcium 8.9 - 10.3 mg/dL 8.0(L) 8.4(L) 8.3(L)  Total Protein 6.5 - 8.1 g/dL 5.1(L) 5.5(L) 5.7(L)  Total Bilirubin 0.3 - 1.2 mg/dL 0.8 0.8 0.8  Alkaline Phos 38 - 126 U/L 60 63 64  AST 15 - 41 U/L 49(H) 38 38  ALT 17 - 63 U/L 32 26 27    CBC Latest Ref Rng & Units 11/11/2015 11/10/2015 11/09/2015  WBC 4.0 - 10.5 K/uL 16.3(H) 18.9(H) 19.9(H)  Hemoglobin 13.0 - 17.0 g/dL 7.8(L) 8.4(L) 8.5(L)  Hematocrit 39.0 - 52.0 % 25.7(L) 27.8(L) 27.4(L)  Platelets 150 - 400 K/uL 434(H) 500(H) 478(H)    ABG    Component Value Date/Time   PHART 7.381 11/04/2015 0330   PCO2ART 52.9 (H) 11/04/2015 0330   PO2ART 65.5 (L) 11/04/2015 0330   HCO3 30.7 (H) 11/04/2015 0330   TCO2 31 11/05/2015 1803   ACIDBASEDEF 1.0 10/29/2015 1531   O2SAT 91.3 11/04/2015 0330    CBG (last 3)   Recent Labs  11/10/15 1913 11/10/15 2351 11/11/15 0401  GLUCAP 145* 163* 138*    Imaging: Dg Chest Port 1 View  Result Date: 11/10/2015 CLINICAL DATA:  Shortness of breath. EXAM: PORTABLE CHEST 1 VIEW COMPARISON:  Radiograph of November 09, 2015. FINDINGS: Stable cardiomegaly is noted. Tracheostomy tube is in good position. Nasogastric tube appears to be stable. Right-sided PICC line is unchanged. No pneumothorax is noted. Stable bilateral perihilar and basilar opacities are noted concerning for possible edema. Mild left pleural effusion is noted which is improved compared to prior exam. Left basilar atelectasis or infiltrate cannot be excluded. Bony thorax is unremarkable. IMPRESSION: Stable bilateral perihilar and basilar edema. Improved left pleural effusion. Left basilar atelectasis or infiltrate cannot be excluded.  Stable support apparatus. Electronically Signed   By: Marijo Conception, M.D.   On: 11/10/2015 07:55    Studies:  Chest [place] 10/26: moderate effusion rt with significant compressive atx, small eff to mod left CT Chest/Abd/Pelvis W/O 10/30: IMPRESSION: 1. Probable congestive heart failure. Patchy consolidation in both lower lobes, left greater than right, worrisome for superimposed pneumonia. 2.  Aortic atherosclerosis (ICD10-170.0). 3. Enlarged subcarinal lymph node, likely reactive. 4. Asbestos related pleural disease. TTE 10/31:  LV with normal size & EF 50-55%. Normal wall motion. Insufficient to assess diastolic function. LA & RA normal in size. RV normal in size and function. No aortic stenosis with trivial regurgitation. Mild mitral regurgitation without stenosis. Possible [name] is linear projection originating from mitral annular calcification within the left atrium noted. No pulmonic stenosis. No tricuspid regurgitation. No pericardial effusion.  Microbiology: MRSA PCR 10/18:  Negative  Tracheal Asp Ctx 10/18:  Oral Flora Blood Ctx x2 10/18:  Negative Tracheal Asp Ctx 10/19:  Oral Flora Tracheal Asp Ctx 10/24:  Oral Flora Blood Ctx 10/24:  1/2 Bottles MRSE Blood Ctx 10/26: 1/2 Bottles Coag Neg Staph  C diff 10/28:  Negative  Right Pleural Effusion 10/27:  Negative  Tracheal Asp Ctx 10/30:  Normal respiratory flora Blood Ctx x2 10/31 >>  Antibiotics: Levaquin 10/17 - 10/24 Aztreonam 10/24 - 10/29 Vancomycin 10/17 - 10/18; 10/25 >>  Significant Events: 10/17 - Admit, cardiac cath, emergent CABG due to NSTEMI. 10/18 - 0600 PCCM consulted for refractory hypoxia 10/25 - reintubated for increased WOB and hypoxia.  10/26 - trached by DF  Lines/Tubes: OETT 10/17 - 10/26 L Rad Art Line 10/17 - 10/26 R IJ PA Cat 10/17 - out CT x3 10/17 - last removed 10/29 Trach (DF) 10/26 >> RUE DL PICC 10/30 >> R NGT 10/26 >> Foley 10/17 >> PIV x1  Assessment/Plan:  81 y.o. male  presented with increasing dyspnea and a fib with RVR.  Found to have 99% Lt main CAD and had emergent CABG 10/17. Patient is having some delirium at night. Adding Seroquel to regimen to hopefully help with this. Continuing as needed Haldol as well. Discontinuing Reglan as his bowel function seems to remain stable. Continuing free water and dextrose IV fluid for correction of hypernatremia. Continuing empiric vancomycin for now while awaiting repeat blood cultures. With abnormality noted on mitral valve I will defer to Dr. Prescott Gum on the need for a transesophageal echocardiogram to better evaluate and rule out a vegetation. Continuing tracheostomy collar trials today with full ventilator support for rest. Continuing nocturnal ventilator support for rest as well. Hopefully the patient will be able to transition to 24 hour tracheostomy collar in the next day or 2.  1. Acute Hypoxic Respiratory Failure:  S/P Trach for vocal chord edema & failure to wean. Continuing T-collar during the day today with full vent support for rest as well as at night. Speech therapy consulted 10/31 for [facility] valve. 2. Sepsis:  Unclear etiology. Leukocytosis improving. Continuing Empiric  Vancomycin. Awaiting repeat blood culture results. 3. Possible MRSE Bacteremia: Continuing empiric Vancomycin. Awaiting repeat blood cultures performed 10/31. Defer to Dr. Prescott Gum on necessity of TEE given mitral valve abnormality.  4. Acute Encephalopathy/Delirium:  Dosage of Pepcid decreased overnight. Discontinuing Reglan. Repeat EKG again in AM 11/3 to continue to monitor. Seroquel 50mg  via tube qhs. Continuing Haldol IV prn. 5. Possible HCAP:  Questionable. S/P 5 days of Aztreonam & ongoing treatment with Vancomycin Day #9. Repeat culture negative from 10/30. 6. NSTEMI:  S/P CABG on 10/17. Management per Dr. Prescott Gum. ASA 325mg  daily & Lipitor qhs.  7. Atrial Fibrillation w/ RVR: Management per Dr. Prescott Gum. Lopressor bid & Diltiazem  drip. 8. Acute on Chronic Renal Failure Stage II:  Resolved. Monitoring renal function and electrolytes daily.  9. Hypernatremia:  Improving. 4.2L Free water deficit this morning. Last dose of Lasix 10/29. Continuing D5W @ 100cc/hr & Free Water 200cc q4hr via tube. 10. Hyperchloremia:  Stable. Continuing to hold Lasix. Trending electrolytes daily.  11. Hypokalemia:  Replaced with KCl 52mEq IV x3 doses. Daily KCl 58mEq VT. Repeat electrolytes in AM 11/3.  12. Metabolic Alkalosis:  Resolved. Likely come element of contraction & compensation.  13. Nausea w/ Emesis:  Currently on Reglan scheduled. Has Zofran IV as needed. Now resolved. 14. Diarrhea:  C diff negative 10/28. No evidence of worsening. 15. Diabetes Mellitus Type II:  Glucose controlled. Continuing SSI per algorithm with accu-checks q4hr. Continuing to hold Levemir. 16. Chronic Diastolic Congestive Heart Failure:  Grade 2 dysfunction on 10/24 echocardiogram. Continuing ASA 325mg , Lipitor 80mg , & Lopressor bid. Holding home Coreg, Cozaar, Imdur, Hydralazine, Fenofibrate, Zocor, Niacin, & Lasix. 81. H/O Moderate COPD:  No signs of exacerbation. Continuing Xopenex nebs tid & Pulmicort neb bid. 9. H/O GERD:  Continuing Pepcid VT daily.  19. Anemia:  No signs of active bleeding. Hgb stable. Continuing to trend cell counts daily w/ CBC. 20. Cardiogenic Shock:  Resolved. 21. Diet:  Continuing & tolerating tube feedings. 22. Prophylaxis:  Lovenox  q24hr, Pepcid VT daily, & SCDs.  I have spent a total of 34 minutes of critical care time today caring for the patient  and reviewing the patient's electronic medical record.   Sonia Baller Ashok Cordia, M.D. [facility] Pulmonary & Critical Care Pager:  [phone] After 3pm or if no response, call [phone] 11/11/2015 7:51 AM

## 2015-11-11 NOTE — Progress Notes (Signed)
Pt with increased work of breathing; RR 34-38 O2 sats dipping into the 80's.  Pt on trach collar and suctioned.  Large amount of white frothy secreations; RT aware and placing patient back on vent.  Will notify MD

## 2015-11-12 LAB — GLUCOSE, CAPILLARY
Glucose-Capillary: 118 mg/dL — ABNORMAL HIGH (ref 65–99)
Glucose-Capillary: 160 mg/dL — ABNORMAL HIGH (ref 65–99)
Glucose-Capillary: 114 mg/dL — ABNORMAL HIGH (ref 65–99)
Glucose-Capillary: 125 mg/dL — ABNORMAL HIGH (ref 65–99)
Glucose-Capillary: 137 mg/dL — ABNORMAL HIGH (ref 65–99)
Glucose-Capillary: 146 mg/dL — ABNORMAL HIGH (ref 65–99)

## 2015-11-12 LAB — MAGNESIUM: Magnesium: 2.3 mg/dL (ref 1.7–2.4)

## 2015-11-12 LAB — COMPREHENSIVE METABOLIC PANEL
ALT: 43 U/L (ref 17–63)
AST: 63 U/L — ABNORMAL HIGH (ref 15–41)
Albumin: 2 g/dL — ABNORMAL LOW (ref 3.5–5.0)
Alkaline Phosphatase: 62 U/L (ref 38–126)
Anion gap: 4 — ABNORMAL LOW (ref 5–15)
BUN: 39 mg/dL — ABNORMAL HIGH (ref 6–20)
CO2: 27 mmol/L (ref 22–32)
Calcium: 8.1 mg/dL — ABNORMAL LOW (ref 8.9–10.3)
Chloride: 117 mmol/L — ABNORMAL HIGH (ref 101–111)
Creatinine, Ser: 1.25 mg/dL — ABNORMAL HIGH (ref 0.61–1.24)
GFR calc Af Amer: >60 mL/min (ref >60)
GFR calc non Af Amer: 53 mL/min — ABNORMAL LOW (ref >60)
Glucose, Bld: 118 mg/dL — ABNORMAL HIGH (ref 65–99)
Potassium: 3.8 mmol/L (ref 3.5–5.1)
Sodium: 148 mmol/L — ABNORMAL HIGH (ref 135–145)
Total Bilirubin: 1 mg/dL (ref 0.3–1.2)
Total Protein: 5 g/dL — ABNORMAL LOW (ref 6.5–8.1)

## 2015-11-12 LAB — CBC
HCT: 24.3 % — ABNORMAL LOW (ref 39.0–52.0)
Hemoglobin: 7.4 g/dL — ABNORMAL LOW (ref 13.0–17.0)
MCH: 30.7 pg (ref 26.0–34.0)
MCHC: 30.5 g/dL (ref 30.0–36.0)
MCV: 100.8 fL — ABNORMAL HIGH (ref 78.0–100.0)
Platelets: 391 10*3/uL (ref 150–400)
RBC: 2.41 MIL/uL — ABNORMAL LOW (ref 4.22–5.81)
RDW: 17.2 % — ABNORMAL HIGH (ref 11.5–15.5)
WBC: 15.8 10*3/uL — ABNORMAL HIGH (ref 4.0–10.5)

## 2015-11-12 LAB — PREPARE RBC (CROSSMATCH)

## 2015-11-12 MED ORDER — HALOPERIDOL LACTATE 5 MG/ML IJ SOLN
5.0000 mg | Freq: Four times a day (QID) | INTRAMUSCULAR | Status: DC | PRN
  Administered 2015-11-12 – 2015-11-15 (×3): 5 mg via INTRAVENOUS
  Filled 2015-11-12 (×3): qty 1

## 2015-11-12 MED ORDER — ALPRAZOLAM 0.5 MG PO TABS: 1.0000 mg | ORAL_TABLET | Freq: Every day | ORAL | Status: DC

## 2015-11-12 MED ORDER — POTASSIUM CHLORIDE 10 MEQ/50ML IV SOLN
10.0000 meq | INTRAVENOUS | Status: AC
  Administered 2015-11-12 (×2): 10 meq via INTRAVENOUS
  Filled 2015-11-12: qty 50

## 2015-11-12 MED ORDER — FUROSEMIDE 10 MG/ML IJ SOLN
20.0000 mg | Freq: Two times a day (BID) | INTRAMUSCULAR | Status: DC
  Administered 2015-11-12 – 2015-11-14 (×5): 20 mg via INTRAVENOUS
  Filled 2015-11-12 (×6): qty 2

## 2015-11-12 MED ORDER — ACETAMINOPHEN 160 MG/5ML PO SOLN
650.0000 mg | ORAL | Status: DC | PRN
  Administered 2015-11-12 – 2015-11-13 (×2): 650 mg
  Filled 2015-11-12 (×2): qty 20.3

## 2015-11-12 MED ORDER — QUETIAPINE FUMARATE 25 MG PO TABS: 75.0000 mg | ORAL_TABLET | Freq: Every day | ORAL | Status: DC

## 2015-11-12 MED ORDER — QUETIAPINE FUMARATE 25 MG PO TABS
50.0000 mg | ORAL_TABLET | Freq: Two times a day (BID) | ORAL | Status: DC
  Administered 2015-11-12 – 2015-11-17 (×12): 50 mg via ORAL
  Filled 2015-11-12 (×13): qty 2

## 2015-11-12 NOTE — Progress Notes (Signed)
Patient ID: Mark Rivers, male   DOB: 04/27/1934, 81 y.o.   MRN: [id number]   SICU Evening Rounds:   Hemodynamically stable   Did not wean on vent today.   Urine output good    CBC    Component Value Date/Time   WBC 15.8 (H) 11/12/2015 0334   RBC 2.41 (L) 11/12/2015 0334   HGB 7.4 (L) 11/12/2015 0334   HCT 24.3 (L) 11/12/2015 0334   PLT 391 11/12/2015 0334   MCV 100.8 (H) 11/12/2015 0334   MCH 30.7 11/12/2015 0334   MCHC 30.5 11/12/2015 0334   RDW 17.2 (H) 11/12/2015 0334   LYMPHSABS 1.7 10/25/2015 2110   MONOABS 0.9 10/25/2015 2110   EOSABS 0.6 10/25/2015 2110   BASOSABS 0.1 10/25/2015 2110     BMET    Component Value Date/Time   NA 148 (H) 11/12/2015 0334   K 3.8 11/12/2015 0334   CL 117 (H) 11/12/2015 0334   CO2 27 11/12/2015 0334   GLUCOSE 118 (H) 11/12/2015 0334   BUN 39 (H) 11/12/2015 0334   CREATININE 1.25 (H) 11/12/2015 0334   CREATININE 1.90 (H) 10/20/2015 1606   CALCIUM 8.1 (L) 11/12/2015 0334   GFRNONAA 53 (L) 11/12/2015 0334   GFRAA >60 11/12/2015 0334     A/P:  Stable day. Continue current plans

## 2015-11-12 NOTE — Progress Notes (Signed)
17 Days Post-Op Procedure(s) (LRB): CORONARY ARTERY BYPASS GRAFTING (CABG) x3(LIMA to LA, SVG to OM1, SVG to PDA) with EVH from the left thigh and partial lower leg greater saphenous vein and left internal mammary artery (N/A) TRANSESOPHAGEAL ECHOCARDIOGRAM (TEE) (N/A) INTRA-AORTIC BALLOON PUMP INSERTION size 40cc (Left) Subjective: More agitation and attempts to pull out lines last night Will add at bedtime Xanax dose to Seroquel Symptomatic postop anemia hemoglobin 7.5 will transfuse a unit of blood cell because of altered mental status White count continues to improve on broad-spectrum antibiotics, chest x-ray pending for tomorrow Ventilator wean has been affected by altered mental status and agitation EKG this a.m. shows sinus rhythm on IV Cardizem with daily Lanoxin dose. Amiodarone has been stopped.  Objective: Vital signs in last 24 hours: Temp:  [97.3 F (36.3 C)-98.4 F (36.9 C)] 98.4 F (36.9 C) (11/03 0345) Pulse Rate:  [35-158] 44 (11/03 0630) Cardiac Rhythm: Atrial fibrillation (11/03 0630) Resp:  [18-37] 24 (11/03 0630) BP: (112-168)/(43-150) 120/43 (11/03 0630) SpO2:  [91 %-100 %] 98 % (11/03 0630) FiO2 (%):  [40 %] 40 % (11/03 0315) Weight:  [218 lb 11.1 oz (99.2 kg)] 218 lb 11.1 oz (99.2 kg) (11/03 0500)  Hemodynamic parameters for last 24 hours:    Intake/Output from previous day: 11/02 0701 - 11/03 0700 In: 3044 [I.V.:1954; NG/GT:690; IV Piggyback:400] Out: 2000 [Urine:2000] Intake/Output this shift: No intake/output data recorded.       Exam    General- alert and comfortable   Lungs- clear without rales, wheezes   Cor- regular rate and rhythm, no murmur , gallop   Abdomen- soft, non-tender   Extremities - warm, non-tender, minimal edema   Neuro- oriented, appropriate, no focal weakness   Lab Results:  Recent Labs  11/11/15 0340 11/12/15 0334  WBC 16.3* 15.8*  HGB 7.8* 7.4*  HCT 25.7* 24.3*  PLT 434* 391   BMET:  Recent Labs   11/11/15 0340 11/12/15 0334  NA 152* 148*  K 3.4* 3.8  CL 120* 117*  CO2 27 27  GLUCOSE 140* 118*  BUN 43* 39*  CREATININE 1.10 1.25*  CALCIUM 8.0* 8.1*    PT/INR: No results for input(s): LABPROT, INR in the last 72 hours. ABG    Component Value Date/Time   PHART 7.381 11/04/2015 0330   HCO3 30.7 (H) 11/04/2015 0330   TCO2 31 11/05/2015 1803   ACIDBASEDEF 1.0 10/29/2015 1531   O2SAT 91.3 11/04/2015 0330   CBG (last 3)   Recent Labs  11/11/15 2004 11/11/15 2325 11/12/15 0336  GLUCAP 138* 151* 118*    Assessment/Plan: S/P Procedure(s) (LRB): CORONARY ARTERY BYPASS GRAFTING (CABG) x3(LIMA to LA, SVG to OM1, SVG to PDA) with EVH from the left thigh and partial lower leg greater saphenous vein and left internal mammary artery (N/A) TRANSESOPHAGEAL ECHOCARDIOGRAM (TEE) (N/A) INTRA-AORTIC BALLOON PUMP INSERTION size 40cc (Left) Leave Foley catheter out and frequent bladder scans Continue current care , one unit of packed cells for altered mental status and anemia   LOS: 18 days    Mark Rivers 11/12/2015

## 2015-11-12 NOTE — Care Management Important Message (Signed)
Important Message  Patient Details  Name: Mark Rivers MRN: [id number] Date of Birth: [birth year]   Medicare Important Message Given:  Yes    Christmas Faraci Abena 11/12/2015, 10:20 AM

## 2015-11-12 NOTE — Progress Notes (Addendum)
PCCM PROGRESS NOTE   Admission date:  10/25/2015 Consult date:  10/27/2015 Referring provider:  Dr. Prescott Gum  CC:  Short of breath  Subjective: Patient had increased work of breathing yesterday and some white/clear secretions that he was expectorating. Started back on lasix. Delirious this morning with sitter at bedside.  Review of Systems:  Unable to obtain given delirium.   Vital signs: BP 134/72   Pulse (!) 108   Temp 98.8 F (37.1 C) (Oral)   Resp (!) 25   Ht 5\' 6"  (1.676 m)   Wt 218 lb 11.1 oz (99.2 kg)   SpO2 98%   BMI 35.30 kg/m    Intake/output: I/O last 3 completed shifts: In: [id number] [I.V.:2735; NG/GT:1405; IV Piggyback:700] Out: 3275 [Urine:2925; Stool:350]  Hemodynamics:    Vent settings: Vent Mode: PCV FiO2 (%):  [40 %] 40 % Set Rate:  [16 bmp] 16 bmp PEEP:  [5 cmH20] 5 cmH20 Plateau Pressure:  [15 cmH20] 15 cmH20  Physcial Exam: General:  Sitter at bedside. Eyes closed. No family at bedside.  Integument:  Warm & dry. No rash on exposed skin.  HEENT: NG tube within right nare. No scleral icterus. Tracheostomy in place. Cardiovascular: Irregularly irregular rhythm. Atrial fibrillation on telemetry with normal but normal rate. Mild edema in lower extremities. Pulmonary:  Normal work of breathing on ventilator. Symmetric chest rise. Clear on auscultation. Abdomen: Soft. Normoactive bowel sounds. Protuberant.  Musculoskeletal:  No joint deformity or effusion appreciated. Neurological:  Sedated. Opens eyes with stimulation then falls back asleep. Pupils symmetric.  LABS: CMP Latest Ref Rng & Units 11/12/2015 11/11/2015 11/10/2015  Glucose 65 - 99 mg/dL 118(H) 140(H) 174(H)  BUN 6 - 20 mg/dL 39(H) 43(H) 48(H)  Creatinine 0.61 - 1.24 mg/dL 1.25(H) 1.10 1.05  Sodium 135 - 145 mmol/L 148(H) 152(H) 156(H)  Potassium 3.5 - 5.1 mmol/L 3.8 3.4(L) 3.2(L)  Chloride 101 - 111 mmol/L 117(H) 120(H) 122(H)  CO2 22 - 32 mmol/L 27 27 29   Calcium 8.9 - 10.3 mg/dL 8.1(L)  8.0(L) 8.4(L)  Total Protein 6.5 - 8.1 g/dL 5.0(L) 5.1(L) 5.5(L)  Total Bilirubin 0.3 - 1.2 mg/dL 1.0 0.8 0.8  Alkaline Phos 38 - 126 U/L 62 60 63  AST 15 - 41 U/L 63(H) 49(H) 38  ALT 17 - 63 U/L 43 32 26    CBC Latest Ref Rng & Units 11/12/2015 11/11/2015 11/10/2015  WBC 4.0 - 10.5 K/uL 15.8(H) 16.3(H) 18.9(H)  Hemoglobin 13.0 - 17.0 g/dL 7.4(L) 7.8(L) 8.4(L)  Hematocrit 39.0 - 52.0 % 24.3(L) 25.7(L) 27.8(L)  Platelets 150 - 400 K/uL 391 434(H) 500(H)    ABG    Component Value Date/Time   PHART 7.381 11/04/2015 0330   PCO2ART 52.9 (H) 11/04/2015 0330   PO2ART 65.5 (L) 11/04/2015 0330   HCO3 30.7 (H) 11/04/2015 0330   TCO2 31 11/05/2015 1803   ACIDBASEDEF 1.0 10/29/2015 1531   O2SAT 91.3 11/04/2015 0330    CBG (last 3)   Recent Labs  11/11/15 2325 11/12/15 0336 11/12/15 0820  GLUCAP 151* 118* 160*    Imaging: Dg Chest Port 1 View  Result Date: 11/11/2015 CLINICAL DATA:  Tracheostomy. EXAM: PORTABLE CHEST 1 VIEW COMPARISON:  11/10/2015 . FINDINGS: Tracheostomy tube, feeding tube, right PICC line in stable position. Interim removal of NG tube. Prior CABG. Cardiomegaly with bilateral pulmonary infiltrates again noted consistent pulmonary edema. Persistent left base atelectasis. Bilateral pleural effusions. No pneumothorax . IMPRESSION: 1. Tracheostomy tube feeding tube, right PICC line stable position. Interim removal  of NG tube. 2. Prior CABG. Cardiomegaly with bilateral pulmonary interstitial prominence again noted consistent with congestive heart failure. Similar findings noted on prior study. Small left pleural effusion again noted. 3. Persistent left lower lobe atelectasis. Electronically Signed   By: Marcello Moores  Register   On: 11/11/2015 07:50    Studies:  Chest [place] 10/26: moderate effusion rt with significant compressive atx, small eff to mod left CT Chest/Abd/Pelvis W/O 10/30: IMPRESSION: 1. Probable congestive heart failure. Patchy consolidation in both lower lobes, left  greater than right, worrisome for superimposed pneumonia. 2.  Aortic atherosclerosis (ICD10-170.0). 3. Enlarged subcarinal lymph node, likely reactive. 4. Asbestos related pleural disease. TTE 10/31:  LV with normal size & EF 50-55%. Normal wall motion. Insufficient to assess diastolic function. LA & RA normal in size. RV normal in size and function. No aortic stenosis with trivial regurgitation. Mild mitral regurgitation without stenosis. Possible [name] is linear projection originating from mitral annular calcification within the left atrium noted. No pulmonic stenosis. No tricuspid regurgitation. No pericardial effusion.  Microbiology: MRSA PCR 10/18:  Negative  Tracheal Asp Ctx 10/18:  Oral Flora Blood Ctx x2 10/18:  Negative Tracheal Asp Ctx 10/19:  Oral Flora Tracheal Asp Ctx 10/24:  Oral Flora Blood Ctx 10/24:  1/2 Bottles MRSE Blood Ctx 10/26: 1/2 Bottles Coag Neg Staph  C diff 10/28:  Negative  Right Pleural Effusion 10/27:  Negative  Tracheal Asp Ctx 10/30:  Normal respiratory flora Blood Ctx x2 10/31 >>  Antibiotics: Levaquin 10/17 - 10/24 Aztreonam 10/24 - 10/29 Vancomycin 10/17 - 10/18; 10/25 >>  Significant Events: 10/17 - Admit, cardiac cath, emergent CABG due to NSTEMI. 10/18 - 0600 PCCM consulted for refractory hypoxia 10/25 - reintubated for increased WOB and hypoxia.  10/26 - trached by DF  Lines/Tubes: OETT 10/17 - 10/26 L Rad Art Line 10/17 - 10/26 R IJ PA Cat 10/17 - out CT x3 10/17 - last removed 10/29 Trach (DF) 10/26 >> RUE DL PICC 10/30 >> R NGT 10/26 >> PIV x1  Assessment/Plan:  81 y.o. male presented with increasing dyspnea and a fib with RVR.  Found to have 99% Lt main CAD and had emergent CABG 10/17. Still with delirium this morning. Improved rate control on Dignoxin. Patient's work of breathing too labored to attempt T-collar at this time but will reassess in the afternoon. Improving hypernatremia and with Lasix for diuresis will discontinue  IVF.   1. Acute Hypoxic Respiratory Failure:  S/P Trach for vocal chord edema & failure to wean. Resting today. Plan to reattempt T-collar this afternoon or tomorrow depending on work of breathing. Speech therapy consulted 10/31 for [facility] valve. Restarted Lasix for diuresis 11/2 per Dr. Prescott Gum. 2. Sepsis:  Unclear etiology but likely MRSE Bacteremia. Leukocytosis improving. Continuing Empiric Vancomycin.  3. MRSE Bacteremia: Continuing empiric Vancomycin Day #4/14. Repeat blood cultures performed 10/31 still negative to date. Defer to Dr. Prescott Gum on necessity of TEE given mitral valve abnormality.  4. Acute Encephalopathy/Delirium:  Repeat EKG again in AM 11/4 to continue to monitor QTc. Increase Seroquel to 50mg  bid. Haldol IV prn. Holding off on Benzos/Xanax.  5. Possible HCAP:  Questionable. S/P 5 days of Aztreonam & ongoing treatment with Vancomycin Day #10 total. Repeat culture negative from 10/30. 6. NSTEMI:  S/P CABG on 10/17. Management per Dr. Prescott Gum. ASA 325mg  daily, Lopressor bid, & Lipitor qhs.  7. Atrial Fibrillation w/ RVR: Management per Dr. Prescott Gum. Digoxin IV daily, Lopressor bid & Diltiazem drip.  8. Acute on Chronic Renal Failure Stage II:  Resolved. Monitoring renal function and electrolytes daily.  9. Hypernatremia:  Improving. 2.86L Free water deficit this morning. Lasix resumed 11/2. Continuing Free Water 50cc q1hr via tube. Discontinuing D5W today. 10. Hyperchloremia:  Improving. Continuing free water. Trending electrolytes daily.  11. Anemia:  No signs of active bleeding. Transfusing 1u PRBC this AM per Dr. Prescott Gum. Trending cell counts daily w/ CBC. 12. Hypokalemia:  Resolved. Daily KCl 45mEq VT. Repeat electrolytes in AM 11/4.  13. Metabolic Alkalosis:  Resolved. Likely come element of contraction & compensation.  14. Nausea w/ Emesis:  Resolved. Has Zofran IV as needed.  15. Diarrhea:  C diff negative 10/28. No evidence of worsening. 16. Diabetes Mellitus  Type II:  Glucose controlled. Continuing SSI per algorithm with accu-checks q4hr. Continuing to hold Levemir. 17. Chronic Diastolic Congestive Heart Failure:  Grade 2 dysfunction on 10/24 echocardiogram. Continuing ASA 325mg , Lipitor 80mg , & Lopressor bid. Holding home Coreg, Cozaar, Imdur, Hydralazine, Fenofibrate, Zocor, & Niacin. 18. H/O Moderate COPD:  No signs of exacerbation. Continuing Xopenex nebs tid & Pulmicort neb bid. 72. H/O GERD:  Continuing Pepcid VT daily.  20. Cardiogenic Shock:  Resolved. 21. Diet:  Continuing & tolerating tube feedings. 22. Prophylaxis:  Lovenox [place] q24hr, Pepcid VT daily, & SCDs.  I have spent a total of 32 minutes of critical care time today caring for the patient  and reviewing the patient's electronic medical record.   Sonia Baller Ashok Cordia, M.D. [facility] Pager:  [phone] After 3pm or if no response, call [phone] 11/12/2015 9:09 AM

## 2015-11-13 ENCOUNTER — Inpatient Hospital Stay (HOSPITAL_COMMUNITY): Payer: Medicare Other

## 2015-11-13 DIAGNOSIS — J9621 Acute and chronic respiratory failure with hypoxia: Secondary | ICD-10-CM

## 2015-11-13 LAB — GLUCOSE, CAPILLARY
Glucose-Capillary: 123 mg/dL — ABNORMAL HIGH (ref 65–99)
Glucose-Capillary: 144 mg/dL — ABNORMAL HIGH (ref 65–99)
Glucose-Capillary: 135 mg/dL — ABNORMAL HIGH (ref 65–99)
Glucose-Capillary: 84 mg/dL (ref 65–99)
Glucose-Capillary: 109 mg/dL — ABNORMAL HIGH (ref 65–99)
Glucose-Capillary: 168 mg/dL — ABNORMAL HIGH (ref 65–99)

## 2015-11-13 LAB — COMPREHENSIVE METABOLIC PANEL
ALT: 41 U/L (ref 17–63)
AST: 53 U/L — ABNORMAL HIGH (ref 15–41)
Albumin: 1.9 g/dL — ABNORMAL LOW (ref 3.5–5.0)
Alkaline Phosphatase: 61 U/L (ref 38–126)
Anion gap: 6 (ref 5–15)
BUN: 50 mg/dL — ABNORMAL HIGH (ref 6–20)
CO2: 27 mmol/L (ref 22–32)
Calcium: 7.7 mg/dL — ABNORMAL LOW (ref 8.9–10.3)
Chloride: 111 mmol/L (ref 101–111)
Creatinine, Ser: 1.63 mg/dL — ABNORMAL HIGH (ref 0.61–1.24)
GFR calc Af Amer: 45 mL/min — ABNORMAL LOW (ref >60)
GFR calc non Af Amer: 38 mL/min — ABNORMAL LOW (ref >60)
Glucose, Bld: 135 mg/dL — ABNORMAL HIGH (ref 65–99)
Potassium: 4.2 mmol/L (ref 3.5–5.1)
Sodium: 144 mmol/L (ref 135–145)
Total Bilirubin: 1 mg/dL (ref 0.3–1.2)
Total Protein: 5.3 g/dL — ABNORMAL LOW (ref 6.5–8.1)

## 2015-11-13 LAB — CBC
HCT: 26.5 % — ABNORMAL LOW (ref 39.0–52.0)
Hemoglobin: 8.2 g/dL — ABNORMAL LOW (ref 13.0–17.0)
MCH: 31.3 pg (ref 26.0–34.0)
MCHC: 30.9 g/dL (ref 30.0–36.0)
MCV: 101.1 fL — ABNORMAL HIGH (ref 78.0–100.0)
Platelets: 304 10*3/uL (ref 150–400)
RBC: 2.62 MIL/uL — ABNORMAL LOW (ref 4.22–5.81)
RDW: 18.6 % — ABNORMAL HIGH (ref 11.5–15.5)
WBC: 12.9 10*3/uL — ABNORMAL HIGH (ref 4.0–10.5)

## 2015-11-13 LAB — URINALYSIS, ROUTINE W REFLEX MICROSCOPIC
Bilirubin Urine: NEGATIVE
Glucose, UA: NEGATIVE mg/dL
Ketones, ur: NEGATIVE mg/dL
Leukocytes, UA: NEGATIVE
NITRITE: NEGATIVE
PROTEIN: NEGATIVE mg/dL
SPECIFIC GRAVITY, URINE: 1.013 (ref 1.005–1.030)
pH: 5 (ref 5.0–8.0)

## 2015-11-13 LAB — LACTIC ACID, PLASMA: LACTIC ACID, VENOUS: 0.8 mmol/L (ref 0.5–1.9)

## 2015-11-13 LAB — TYPE AND SCREEN
ABO/RH(D): O POS
Antibody Screen: NEGATIVE
Unit division: 0

## 2015-11-13 LAB — MAGNESIUM: MAGNESIUM: 2.3 mg/dL (ref 1.7–2.4)

## 2015-11-13 LAB — URINE MICROSCOPIC-ADD ON: WBC UA: NONE SEEN WBC/hpf (ref 0–5)

## 2015-11-13 LAB — PROCALCITONIN: Procalcitonin: 0.19 ng/mL

## 2015-11-13 NOTE — Progress Notes (Signed)
PCCM PROGRESS NOTE   Admission date:  10/25/2015 Consult date:  10/27/2015 Referring provider:  Dr. Prescott Gum  CC:  Short of breath  Subjective:  Failed weaning again. Desaturated and increased WOB Febrile  Vital signs: BP (!) 100/51   Pulse 63   Temp (!) 101.4 F (38.6 C) (Oral)   Resp (!) 22   Ht 5\' 6"  (1.676 m)   Wt 219 lb 5.7 oz (99.5 kg)   SpO2 93%   BMI 35.41 kg/m    Intake/output: I/O last 3 completed shifts: In: [id number] [I.V.:1403; Blood:330; NG/GT:2500; IV Piggyback:700] Out: 3600 [Urine:3600]  Hemodynamics:    Vent settings: Vent Mode: PCV FiO2 (%):  [40 %] 40 % Set Rate:  [16 bmp] 16 bmp PEEP:  [5 cmH20] 5 cmH20 Plateau Pressure:  [18 cmH20] 18 cmH20  Intake/Output Summary (Last 24 hours) at 11/13/15 1201 Last data filed at 11/13/15 1100  Gross per 24 hour  Intake             2560 ml  Output             2225 ml  Net              335 ml   Physcial Exam: General: no distress on full support Eyes closed. No family at bedside.  Integument:  Warm & dry. No rash on exposed skin.  HEENT: NG tube within right nare. No scleral icterus. Tracheostomy in place. Cardiovascular: Irregularly irregular rhythm. Atrial fibrillation on telemetry with normal but normal rate. Mild edema in lower extremities. Pulmonary:  Normal work of breathing on ventilator. Symmetric chest rise. Clear on auscultation. No accessory use except when placed on PSV Abdomen: Soft. Normoactive bowel sounds. Protuberant.  Musculoskeletal:  No joint deformity or effusion appreciated. Does have anasarca  Neurological:  Sedated. Opens eyes with stimulation then falls back asleep. Pupils symmetric.  LABS: CMP Latest Ref Rng & Units 11/13/2015 11/12/2015 11/11/2015  Glucose 65 - 99 mg/dL 135(H) 118(H) 140(H)  BUN 6 - 20 mg/dL 50(H) 39(H) 43(H)  Creatinine 0.61 - 1.24 mg/dL 1.63(H) 1.25(H) 1.10  Sodium 135 - 145 mmol/L 144 148(H) 152(H)  Potassium 3.5 - 5.1 mmol/L 4.2 3.8 3.4(L)  Chloride 101 - 111  mmol/L 111 117(H) 120(H)  CO2 22 - 32 mmol/L 27 27 27   Calcium 8.9 - 10.3 mg/dL 7.7(L) 8.1(L) 8.0(L)  Total Protein 6.5 - 8.1 g/dL 5.3(L) 5.0(L) 5.1(L)  Total Bilirubin 0.3 - 1.2 mg/dL 1.0 1.0 0.8  Alkaline Phos 38 - 126 U/L 61 62 60  AST 15 - 41 U/L 53(H) 63(H) 49(H)  ALT 17 - 63 U/L 41 43 32    CBC Latest Ref Rng & Units 11/13/2015 11/12/2015 11/11/2015  WBC 4.0 - 10.5 K/uL 12.9(H) 15.8(H) 16.3(H)  Hemoglobin 13.0 - 17.0 g/dL 8.2(L) 7.4(L) 7.8(L)  Hematocrit 39.0 - 52.0 % 26.5(L) 24.3(L) 25.7(L)  Platelets 150 - 400 K/uL 304 391 434(H)    ABG    Component Value Date/Time   PHART 7.381 11/04/2015 0330   PCO2ART 52.9 (H) 11/04/2015 0330   PO2ART 65.5 (L) 11/04/2015 0330   HCO3 30.7 (H) 11/04/2015 0330   TCO2 31 11/05/2015 1803   ACIDBASEDEF 1.0 10/29/2015 1531   O2SAT 91.3 11/04/2015 0330    CBG (last 3)   Recent Labs  11/12/15 2323 11/13/15 0333 11/13/15 0915  GLUCAP 146* 123* 144*    Imaging: Dg Chest Port 1 View  Result Date: 11/13/2015 CLINICAL DATA:  Hx of CABG; SOB EXAM:  PORTABLE CHEST 1 VIEW COMPARISON:  11/11/2015 FINDINGS: There is left greater than right lung base opacity most likely combination of pleural effusions and atelectasis. Right lung base opacity has mildly increased from the prior study. Mild interstitial thickening is noted in the lung bases similar to the prior exam. Remainder of the lungs is clear. No pneumothorax. Tracheostomy tube, enteric feeding tube and right PICC are stable. IMPRESSION: 1. Apparent mild increase in opacity at the right lung base. This may reflect an increased pleural fluid/atelectasis. It could potentially be an artifact related to patient positioning. 2. No other evidence of a change. There are left greater than right pleural effusions with associated basilar atelectasis. There is mild interstitial prominence, but no overt pulmonary edema. Electronically Signed   By: Lajean Manes M.D.   On: 11/13/2015 07:41  increased right  sided airspace disease. Effusion vs atx Trach good position  Studies:  Chest [place] 10/26: moderate effusion rt with significant compressive atx, small eff to mod left CT Chest/Abd/Pelvis W/O 10/30: IMPRESSION: 1. Probable congestive heart failure. Patchy consolidation in both lower lobes, left greater than right, worrisome for superimposed pneumonia. 2.  Aortic atherosclerosis (ICD10-170.0). 3. Enlarged subcarinal lymph node, likely reactive. 4. Asbestos related pleural disease. TTE 10/31:  LV with normal size & EF 50-55%. Normal wall motion. Insufficient to assess diastolic function. LA & RA normal in size. RV normal in size and function. No aortic stenosis with trivial regurgitation. Mild mitral regurgitation without stenosis. Possible [name] is linear projection originating from mitral annular calcification within the left atrium noted. No pulmonic stenosis. No tricuspid regurgitation. No pericardial effusion.  Microbiology: MRSA PCR 10/18:  Negative  Tracheal Asp Ctx 10/18:  Oral Flora Blood Ctx x2 10/18:  Negative Tracheal Asp Ctx 10/19:  Oral Flora Tracheal Asp Ctx 10/24:  Oral Flora Blood Ctx 10/24:  1/2 Bottles MRSE Blood Ctx 10/26: 1/2 Bottles Coag Neg Staph  C diff 10/28:  Negative  Right Pleural Effusion 10/27:  Negative  Tracheal Asp Ctx 10/30:  Normal respiratory flora Blood Ctx x2 10/31 >> Tracheal aspirate 11/4>>>  Antibiotics: Levaquin 10/17 - 10/24 Aztreonam 10/24 - 10/29 Vancomycin 10/17 - 10/18; 10/25 >>  Significant Events: 10/17 - Admit, cardiac cath, emergent CABG due to NSTEMI. 10/18 - 0600 PCCM consulted for refractory hypoxia 10/25 - reintubated for increased WOB and hypoxia.  10/26 - trached by DF  Lines/Tubes: OETT 10/17 - 10/26 L Rad Art Line 10/17 - 10/26 R IJ PA Cat 10/17 - out CT x3 10/17 - last removed 10/29 Trach (DF) 10/26 >> RUE DL PICC 10/30 >> R NGT 10/26 >> PIV x1  Assessment/Plan:  81 y.o. male presented with increasing dyspnea  and a fib with RVR.  Found to have 99% Lt main CAD and had emergent CABG 10/17. Major barrier at this point is likely: deconditioning, critical care malnutrition and delirium. The delirium seems better. He did fail weaning again 11/4. CXR w/ increased Right sided airspace disease. Creatinine is rising w/ lasix.  His FT coiled in his mouth. Will have this re-assessed and re-positioned. I am concerned he may have an HCAP again.   Acute Hypoxic Respiratory Failure:  S/P Trach for vocal cord edema & failure to wean. Right >Left atelectasis/effusion R/o HCAP w/ worsening r sided airspace disease and fever (Worseing aeration on CXR 11/4) Severe deconditioning and protein calorie malnutrition - cont full vent support - Restarted Lasix for diuresis 11/2 per Dr. Prescott Gum. This may need to be backed down IF  creatinine continues to climb - repeat sputum culture; check PCT. If elevated we will start back Levaquin and Azactam  - if CXR unchanged may need to consider [place] of chest and possible thora    MRSE Bacteremia:  - Continuing empiric Vancomycin Day #5/14.  - Repeat blood cultures performed 10/31 still negative to date -  Defer to Dr. Prescott Gum on necessity of TEE given mitral valve abnormality.   Acute Encephalopathy/Delirium:  - continue to monitor QTc. -  Seroquel 50mg  bid. - Haldol IV prn. Holding off on Benzos/Xanax.    NSTEMI:  S/P CABG on 10/17.  Atrial Fibrillation w/ RVR:  1Chronic Diastolic Congestive Heart Failure:  Grade 2 dysfunction on 10/24 echocardiogram - Management per Dr. Prescott Gum. ASA 325mg  daily, Lopressor bid, & Lipitor qhs. - Management per Dr. Prescott Gum. Digoxin IV daily, Lopressor bid & Diltiazem drip - Continuing ASA 325mg , Lipitor 80mg , & Lopressor bid.  - Holding home Coreg, Cozaar, Imdur, Hydralazine, Fenofibrate, Zocor, & Niacin.  Acute on Chronic Renal Failure Stage II: had resolved, creatinine again rising. Worried that this could be  lasix Hypernatremia: Hyperchloremia: - f/u creatinine in am; if still rising would hold lasix and re-assess.  - Monitoring renal function and electrolytes daily.   Improving. Lasix resumed 11/2.  - Continuing Free Water 50cc q1hr via tube. - Trending electrolytes daily.   Anemia:  No signs of active bleeding. Transfused 1u PRBC 11/3 per Dr. Prescott Gum.  - Trending cell counts daily w/ CBC.  Diarrhea:  C diff negative 10/28. No evidence of worsening.  Diabetes Mellitus Type II:  Glucose controlled.  - Continuing SSI per algorithm with accu-checks q4hr. Continuing to hold Levemir.   H/O Moderate COPD:  No signs of exacerbation. -  Continuing Xopenex nebs tid & Pulmicort neb bid.  H/O GERD: -   Continuing Pepcid VT daily.    Diet:  Continuing & tolerating tube feedings.  Prophylaxis:  Lovenox [place] q24hr, Pepcid VT daily, & SCDs.   Erick Colace ACNP-BC [place] Pager # [phone] OR # [phone] if no answer

## 2015-11-13 NOTE — Progress Notes (Signed)
Sputum culture collected, sent to lab.  

## 2015-11-13 NOTE — Progress Notes (Signed)
18 Days Post-Op Procedure(s) (LRB): CORONARY ARTERY BYPASS GRAFTING (CABG) x3(LIMA to LA, SVG to OM1, SVG to PDA) with EVH from the left thigh and partial lower leg greater saphenous vein and left internal mammary artery (N/A) TRANSESOPHAGEAL ECHOCARDIOGRAM (TEE) (N/A) INTRA-AORTIC BALLOON PUMP INSERTION size 40cc (Left) Subjective:  He is sedated now but was alert and cooperative earlier when I saw him. Weaned on vent for 3 hrs today but got tachypneic. Reportedly had air leak around trach cuff.  Objective: Vital signs in last 24 hours: Temp:  [98.2 F (36.8 C)-101.6 F (38.7 C)] 100.8 F (38.2 C) (11/04 1927) Pulse Rate:  [63-142] 94 (11/04 2000) Cardiac Rhythm: Atrial fibrillation (11/04 2000) Resp:  [15-35] 21 (11/04 2000) BP: (89-145)/(38-83) 119/50 (11/04 2000) SpO2:  [90 %-99 %] 92 % (11/04 2000) FiO2 (%):  [40 %] 40 % (11/04 2000) Weight:  [99.5 kg (219 lb 5.7 oz)] 99.5 kg (219 lb 5.7 oz) (11/04 0500)  Hemodynamic parameters for last 24 hours:    Intake/Output from previous day: 11/03 0701 - 11/04 0700 In: 3300 [I.V.:380; Blood:330; NG/GT:2090; IV Piggyback:500] Out: 2850 [Urine:2850] Intake/Output this shift: Total I/O In: 90 [I.V.:10; NG/GT:80] Out: 500 [Urine:500]  General appearance: no distress Heart: irregular rate and rhythm, S1, S2 normal, no murmur, click, rub or gallop Lungs: rhonchi bilaterally Abdomen: soft, non-tender; bowel sounds normal; no masses,  no organomegaly Extremities: edema mild Wound: incision well-healed  Lab Results:  Recent Labs  11/12/15 0334 11/13/15 0433  WBC 15.8* 12.9*  HGB 7.4* 8.2*  HCT 24.3* 26.5*  PLT 391 304   BMET:  Recent Labs  11/12/15 0334 11/13/15 0433  NA 148* 144  K 3.8 4.2  CL 117* 111  CO2 27 27  GLUCOSE 118* 135*  BUN 39* 50*  CREATININE 1.25* 1.63*  CALCIUM 8.1* 7.7*    PT/INR: No results for input(s): LABPROT, INR in the last 72 hours. ABG    Component Value Date/Time   PHART 7.381  11/04/2015 0330   HCO3 30.7 (H) 11/04/2015 0330   TCO2 31 11/05/2015 1803   ACIDBASEDEF 1.0 10/29/2015 1531   O2SAT 91.3 11/04/2015 0330   CBG (last 3)   Recent Labs  11/13/15 1236 11/13/15 1650 11/13/15 1921  GLUCAP 135* 84 109*   CLINICAL DATA:  Hx of CABG; SOB  EXAM: PORTABLE CHEST 1 VIEW  COMPARISON:  11/11/2015  FINDINGS: There is left greater than right lung base opacity most likely combination of pleural effusions and atelectasis. Right lung base opacity has mildly increased from the prior study. Mild interstitial thickening is noted in the lung bases similar to the prior exam. Remainder of the lungs is clear.  No pneumothorax.  Tracheostomy tube, enteric feeding tube and right PICC are stable.  IMPRESSION: 1. Apparent mild increase in opacity at the right lung base. This may reflect an increased pleural fluid/atelectasis. It could potentially be an artifact related to patient positioning. 2. No other evidence of a change. There are left greater than right pleural effusions with associated basilar atelectasis. There is mild interstitial prominence, but no overt pulmonary edema.   Electronically Signed   By: Lajean Manes M.D.   On: 11/13/2015 07:41  Assessment/Plan: S/P Procedure(s) (LRB): CORONARY ARTERY BYPASS GRAFTING (CABG) x3(LIMA to LA, SVG to OM1, SVG to PDA) with EVH from the left thigh and partial lower leg greater saphenous vein and left internal mammary artery (N/A) TRANSESOPHAGEAL ECHOCARDIOGRAM (TEE) (N/A) INTRA-AORTIC BALLOON PUMP INSERTION size 40cc (Left)  Acute hypoxic respiratory failure:  weaning vent as tolerated per CCM. CXR shows bilateral effusion and decreased aeration on the right. procalcitonin and venous lactic acid are low and WBC minimally elevated, afebrile. More likely atelectasis and effusion than pneumonia. Repeat sputum, urine and blood cultures sent today.  Rate controlled atrial fibrillation on digoxin, lopressor  and diltiazem  MRSE bacteremia: on Vanc day 5. Repeat BC negative 10/31.  Delirium with agitation at times. On seroquel and prn Haldol  Chronic kidney disease: creat up today, possibly due to diuresis. Will repeat in am.  Anemia: Hgb improved today after transfusion to 8.2.  Tolerating tube feeds   LOS: 19 days    Gaye Pollack 11/13/2015

## 2015-11-14 ENCOUNTER — Inpatient Hospital Stay (HOSPITAL_COMMUNITY): Payer: Medicare Other

## 2015-11-14 LAB — MAGNESIUM: Magnesium: 2.2 mg/dL (ref 1.7–2.4)

## 2015-11-14 LAB — COMPREHENSIVE METABOLIC PANEL
ALT: 42 U/L (ref 17–63)
AST: 56 U/L — ABNORMAL HIGH (ref 15–41)
Albumin: 1.9 g/dL — ABNORMAL LOW (ref 3.5–5.0)
Alkaline Phosphatase: 68 U/L (ref 38–126)
Anion gap: 7 (ref 5–15)
BUN: 51 mg/dL — ABNORMAL HIGH (ref 6–20)
CO2: 28 mmol/L (ref 22–32)
Calcium: 7.8 mg/dL — ABNORMAL LOW (ref 8.9–10.3)
Chloride: 107 mmol/L (ref 101–111)
Creatinine, Ser: 1.73 mg/dL — ABNORMAL HIGH (ref 0.61–1.24)
GFR calc Af Amer: 41 mL/min — ABNORMAL LOW (ref >60)
GFR calc non Af Amer: 36 mL/min — ABNORMAL LOW (ref >60)
Glucose, Bld: 124 mg/dL — ABNORMAL HIGH (ref 65–99)
Potassium: 4.1 mmol/L (ref 3.5–5.1)
Sodium: 142 mmol/L (ref 135–145)
Total Bilirubin: 0.9 mg/dL (ref 0.3–1.2)
Total Protein: 5.3 g/dL — ABNORMAL LOW (ref 6.5–8.1)

## 2015-11-14 LAB — CBC
HCT: 26.4 % — ABNORMAL LOW (ref 39.0–52.0)
Hemoglobin: 8.2 g/dL — ABNORMAL LOW (ref 13.0–17.0)
MCH: 31.2 pg (ref 26.0–34.0)
MCHC: 31.1 g/dL (ref 30.0–36.0)
MCV: 100.4 fL — ABNORMAL HIGH (ref 78.0–100.0)
Platelets: 291 10*3/uL (ref 150–400)
RBC: 2.63 MIL/uL — ABNORMAL LOW (ref 4.22–5.81)
RDW: 17.9 % — ABNORMAL HIGH (ref 11.5–15.5)
WBC: 13.8 10*3/uL — ABNORMAL HIGH (ref 4.0–10.5)

## 2015-11-14 LAB — CULTURE, BLOOD (ROUTINE X 2)
Culture: NO GROWTH
Culture: NO GROWTH

## 2015-11-14 LAB — VANCOMYCIN, TROUGH: Vancomycin Tr: 34 ug/mL (ref 15–20)

## 2015-11-14 LAB — GLUCOSE, CAPILLARY
Glucose-Capillary: 113 mg/dL — ABNORMAL HIGH (ref 65–99)
Glucose-Capillary: 124 mg/dL — ABNORMAL HIGH (ref 65–99)
Glucose-Capillary: 137 mg/dL — ABNORMAL HIGH (ref 65–99)
Glucose-Capillary: 138 mg/dL — ABNORMAL HIGH (ref 65–99)
Glucose-Capillary: 146 mg/dL — ABNORMAL HIGH (ref 65–99)

## 2015-11-14 LAB — URINE CULTURE: CULTURE: NO GROWTH

## 2015-11-14 LAB — PROCALCITONIN: PROCALCITONIN: 0.17 ng/mL

## 2015-11-14 NOTE — NC FL2 (Signed)
[place] LEVEL OF CARE SCREENING TOOL     IDENTIFICATION  Patient Name: Mark Rivers Birthdate: [birth year] Sex: male Admission Date (Current Location): 10/25/2015  [facility] and [place] Number:  Herbalist and Address:  [facility], [address] [place], [place] [address]      Provider Number: [id number]  Attending Physician Name and Address:  Ivin Poot, MD  Relative Name and Phone Number:       Current Level of Care: Hospital Recommended Level of Care: [place] Prior Approval Number:    Date Approved/Denied:   PASRR Number:   [id number]   Discharge Plan: SNF    Current Diagnoses: Patient Active Problem List   Diagnosis Date Noted  . Surgery, elective   . Coronary artery disease 10/27/2015  . S/P CABG x 3   . Respiratory failure ([place])   . NSTEMI (non-ST elevated myocardial infarction) ([place])   . Atrial fibrillation with RVR ([place]) 10/25/2015  . Seasonal allergic conjunctivitis 08/25/2015  . History of tobacco abuse 08/25/2015  . Acute maxillary sinusitis 06/25/2015  . Diabetes mellitus ([place]) 03/11/2015  . Diabetes mellitus with stage 3 chronic kidney disease ([place]) 03/11/2015  . Metabolic bone disease 41/28/7867  . Vitamin D deficiency 03/11/2015  . Atopic dermatitis 02/24/2015  . Hip pain 09/24/2014  . Need for immunization against influenza 09/24/2014  . Benign essential hypertension 03/13/2014  . Generalized osteoarthritis of multiple sites 03/13/2014  . CKD (chronic kidney disease) stage 3, GFR 30-59 ml/min 03/04/2014  . Acute stress disorder 11/12/2013  . Seasonal and perennial allergic rhinitis 11/12/2013  . Allergic urticaria 11/12/2013  . Benign neoplasm of colon 11/12/2013  . Causalgia of upper extremity 11/12/2013  . Cervical radiculopathy 11/12/2013  . Neck pain 11/12/2013  . Chronic fatigue 11/12/2013  . Dizziness 11/12/2013  . Hearing loss 11/12/2013  . Insomnia  11/12/2013  . Obesity 11/12/2013  . Premature ventricular contractions 11/12/2013  . Psychosexual dysfunction with inhibited sexual excitement 11/12/2013  . Hyperlipemia 09/11/2013  . Type 2 diabetes mellitus, uncontrolled ([place]) 09/11/2013  . Pulmonary emphysema ([place]) 04/22/2013  . SOB (shortness of breath) 03/12/2013  . Chronic coronary artery disease 03/12/2013  . Esophageal reflux   . Benign hypertension with chronic kidney disease, stage III   . Allergy   . Neuropathy ([place])   . Back pain     Orientation RESPIRATION BLADDER Height & Weight     Self  Tracheostomy Continent Weight: 218 lb 4.1 oz (99 kg) Height:  _0  (167.6 cm)  BEHAVIORAL SYMPTOMS/MOOD NEUROLOGICAL BOWEL NUTRITION STATUS   (None)  (none) Continent  (NPO)  AMBULATORY STATUS COMMUNICATION OF NEEDS Skin   Extensive Assist Verbally Surgical wounds                       Personal Care Assistance Level of Assistance  Feeding Bathing Assistance: Limited assistance Feeding assistance: Maximum assistance Dressing Assistance: Limited assistance     Functional Limitations Info  Sight, Hearing, Speech Sight Info: Adequate Hearing Info: Adequate Speech Info: Adequate    SPECIAL CARE FACTORS FREQUENCY  PT (By licensed PT)     PT Frequency:  (5X/WEEK)              Contractures Contractures Info: Not present    Additional Factors Info  Code Status, Allergies Code Status Info:  (FULL) Allergies Info:  (Amoxicillin, Cephalexin, Penicillin G)  Current Medications (11/14/2015):  This is the current hospital active medication list Current Facility-Administered Medications  Medication Dose Route Frequency Provider Last Rate Last Dose  . acetaminophen (TYLENOL) solution 650 mg  650 mg Per Tube Q4H PRN Ivin Poot, MD   650 mg at 11/13/15 0959  . acetaminophen (TYLENOL) tablet 650 mg  650 mg Oral Q6H PRN Ivin Poot, MD   650 mg at 11/02/15 0558  . aspirin EC tablet 325 mg  325 mg  Oral Daily Nani Skillern, PA-C   325 mg at 10/30/15 1050   Or  . aspirin chewable tablet 324 mg  324 mg Per Tube Daily Nani Skillern, PA-C   324 mg at 11/14/15 0818  . atorvastatin (LIPITOR) tablet 80 mg  80 mg Oral q1800 Larey Dresser, MD   80 mg at 11/13/15 1728  . budesonide (PULMICORT) nebulizer solution 0.5 mg  0.5 mg Nebulization BID Jose Shirl Harris, MD   0.5 mg at 11/14/15 0759  . chlorhexidine gluconate (MEDLINE KIT) (PERIDEX) 0.12 % solution 15 mL  15 mL Mouth Rinse BID [name], MD   15 mL at 11/14/15 0800  . digoxin (LANOXIN) 0.25 MG/ML injection 0.125 mg  0.125 mg Intravenous Daily Ivin Poot, MD   0.125 mg at 11/14/15 0818  . diltiazem (CARDIZEM) 100 mg in dextrose 5 % 100 mL (1 mg/mL) infusion  10 mg/hr Intravenous Titrated Ivin Poot, MD 10 mL/hr at 11/14/15 0900 10 mg/hr at 11/14/15 0900  . enoxaparin (LOVENOX) injection 40 mg  40 mg Subcutaneous Q24H Ivin Poot, MD   40 mg at 11/13/15 1927  . famotidine (PEPCID) 40 MG/5ML suspension 20 mg  20 mg Per Tube Daily Wilhelmina Mcardle, MD   20 mg at 11/14/15 0818  . feeding supplement (JEVITY 1.2 CAL) liquid 1,000 mL  1,000 mL Per Tube Continuous Ivin Poot, MD 30 mL/hr at 11/14/15 0900 1,000 mL at 11/14/15 0900  . feeding supplement (PRO-STAT SUGAR FREE 64) liquid 60 mL  60 mL Per Tube QID Ivin Poot, MD   60 mL at 11/14/15 0818  . fentaNYL (SUBLIMAZE) injection 50 mcg  50 mcg Intravenous Q2H PRN Tasrif Ahmed, MD   50 mcg at 11/14/15 0446  . free water 50 mL  50 mL Per Tube Q1H Javier Glazier, MD   50 mL at 11/14/15 1200  . Gerhardt's butt cream   Topical PRN Grace Isaac, MD      . haloperidol lactate (HALDOL) injection 5 mg  5 mg Intravenous Q6H PRN Javier Glazier, MD   5 mg at 11/12/15 2216  . hydrocortisone 1 % ointment   Topical BID Ivin Poot, MD      . insulin aspart (novoLOG) injection 0-24 Units  0-24 Units Subcutaneous Q4H Ivin Poot, MD   4 Units at  11/13/15 2348  . levalbuterol (XOPENEX) nebulizer solution 0.63 mg  0.63 mg Nebulization Q6H PRN Ivin Poot, MD      . levalbuterol [facility]) nebulizer solution 0.63 mg  0.63 mg Nebulization TID Ivin Poot, MD   0.63 mg at 11/14/15 0755  . MEDLINE mouth rinse  15 mL Mouth Rinse 10 times per day [name], MD   15 mL at 11/14/15 1200  . metoprolol (LOPRESSOR) injection 2.5-5 mg  2.5-5 mg Intravenous Q2H PRN Nani Skillern, PA-C   5 mg at 11/10/15 1720  . metoprolol tartrate (LOPRESSOR)  25 mg/10 mL oral suspension 12.5 mg  12.5 mg Per Tube BID Nani Skillern, PA-C   12.5 mg at 11/14/15 5053   Or  . metoprolol tartrate (LOPRESSOR) tablet 12.5 mg  12.5 mg Oral BID Nani Skillern, PA-C   12.5 mg at 11/03/15 9767  . morphine 2 MG/ML injection 2-5 mg  2-5 mg Intravenous Q1H PRN Nani Skillern, PA-C   2 mg at 11/12/15 0349  . neomycin-bacitracin-polymyxin (NEOSPORIN) ointment   Topical BID Ivin Poot, MD      . ondansetron [facility]) injection 4 mg  4 mg Intravenous Q6H PRN Nani Skillern, PA-C   4 mg at 11/12/15 1202  . QUEtiapine (SEROQUEL) tablet 50 mg  50 mg Oral BID Javier Glazier, MD   50 mg at 11/14/15 0817  . sodium chloride flush (NS) 0.9 % injection 10-40 mL  10-40 mL Intracatheter Q12H Ivin Poot, MD   10 mL at 11/13/15 2115  . sodium chloride flush (NS) 0.9 % injection 10-40 mL  10-40 mL Intracatheter PRN Ivin Poot, MD   10 mL at 11/06/15 0944  . sodium chloride flush (NS) 0.9 % injection 10-40 mL  10-40 mL Intracatheter Q12H Ivin Poot, MD   10 mL at 11/13/15 2115  . sodium chloride flush (NS) 0.9 % injection 10-40 mL  10-40 mL Intracatheter PRN Ivin Poot, MD      . sodium chloride flush (NS) 0.9 % injection 3 mL  3 mL Intravenous Q12H Nani Skillern, PA-C   3 mL at 11/13/15 2115  . sodium chloride flush (NS) 0.9 % injection 3 mL  3 mL Intravenous PRN Donielle Liston Alba, PA-C      . traZODone (DESYREL) tablet  100 mg  100 mg Oral QHS PRN Chesley Mires, MD   100 mg at 11/12/15 0103     Discharge Medications: Please see discharge summary for a list of discharge medications.  Relevant Imaging Results:  Relevant Lab Results:   Additional Information SS#:[id number]  Junie Spencer, LCSW

## 2015-11-14 NOTE — Progress Notes (Signed)
Patient ID: Mark Rivers, male   DOB: [birth year], 81 y.o.   MRN: [id number]  SICU Evening Rounds  He has been hemodynamically stable today Rate controlled afib on cardizem 10 Weaned well today. Temp 100.9 this afternoon. Vanc held due to high trough of 34. Repeat cultures pending. Excellent urine output.

## 2015-11-14 NOTE — Progress Notes (Signed)
Pharmacy Antibiotic Note  Mark Rivers is a 81 y.o. male continues on vancomycin day #12 for pneumonia and MRSE bacteremia. Level obtained on 11/2 was slightly below goal and vancomycin dose was increased. Today's vancomycin trough is now supratherapeutic at 34 (drawn correctly). Renal function worsening sCr 1.25 > 1.63 > 1.73. Lasix stopped.  Vancomycin trough goal 15-20  Plan: 1) Hold vancomycin 2) Check vancomycin random tomorrow morning   Height: 5\' 6"  (167.6 cm) Weight: 218 lb 4.1 oz (99 kg) IBW/kg (Calculated) : 63.8  Temp (24hrs), Avg:99.8 F (37.7 C), Min:99 F (37.2 C), Max:100.8 F (38.2 C)   Recent Labs Lab 11/10/15 0441 11/11/15 0340 11/11/15 0945 11/12/15 0334 11/13/15 0433 11/13/15 1330 11/14/15 0410 11/14/15 1037  WBC 18.9* 16.3*  --  15.8* 12.9*  --  13.8*  --   CREATININE 1.05 1.10  --  1.25* 1.63*  --  1.73*  --   LATICACIDVEN  --   --   --   --   --  0.8  --   --   VANCOTROUGH  --   --  14*  --   --   --   --  34*    Estimated Creatinine Clearance: 38.1 mL/min (by C-G formula based on SCr of 1.73 mg/dL (H)).    Allergies  Allergen Reactions  . Amoxicillin Other (See Comments)    PATIENT PASSES OUT!!!!  . Cephalexin Other (See Comments)    PATIENT PASSES OUT!!!!  . Penicillin G Other (See Comments)    Has patient had a PCN reaction causing immediate rash, facial/tongue/throat swelling, SOB or lightheadedness with hypotension: Yes Has patient had a PCN reaction causing severe rash involving mucus membranes or skin necrosis: No Has patient had a PCN reaction that required hospitalization: Yes Has patient had a PCN reaction occurring within the last 10 years: Yes If all of the above answers are "NO", then may proceed with Cephalosporin use. PATIENT PASSES OUT!!!!   Antimicrobials this admission:  Levofloxacin 10/17>>10/23 Vancomycin 10/17>>10/18 Aztreonam 10/24>>10/30 Vancomycin 10/25>>  Dose adjustments this admission:  10/31 VT = 12 on  1250 q24 >> 750mg  q12  11/2 VT = 14 mcg/mL 750mg  q12 (checked prior to 3rd dose) >> 1gm q12 11/4 VT = 34 mcg/mL 1gm q12 >> hold  Microbiology results:  10/18 MRSA PCR - negative 10/18, 10/19, 10/24, 10/30 TA - negative 10/18 Blood x 2: neg 10/24 BCx: CoNS 1 of 2 (BCID MRSE, Vanc MIC = 1) 10/26 BCx: CoNS 1 of 2 (BCID MRSE) 10/27 R pleural fluid - negative 10/28 Cdiff: negative 10/31 BCx x2: NGTD 11/4 resp: 11/4 urine: 11/4 blood x 2: ngtd   Nena Jordan, PharmD, BCPS 11/14/2015, 11:38 AM

## 2015-11-14 NOTE — Progress Notes (Signed)
19 Days Post-Op Procedure(s) (LRB): CORONARY ARTERY BYPASS GRAFTING (CABG) x3(LIMA to LA, SVG to OM1, SVG to PDA) with EVH from the left thigh and partial lower leg greater saphenous vein and left internal mammary artery (N/A) TRANSESOPHAGEAL ECHOCARDIOGRAM (TEE) (N/A) INTRA-AORTIC BALLOON PUMP INSERTION size 40cc (Left) Subjective: On trach collar, calm and cooperative. Says nothing is bothering him.  Objective: Vital signs in last 24 hours: Temp:  [99 F (37.2 C)-100.8 F (38.2 C)] 100.5 F (38.1 C) (11/05 0900) Pulse Rate:  [63-96] 71 (11/05 0900) Cardiac Rhythm: Atrial fibrillation (11/05 0800) Resp:  [16-29] 19 (11/05 0900) BP: (93-150)/(42-91) 111/91 (11/05 0900) SpO2:  [87 %-99 %] 99 % (11/05 0900) FiO2 (%):  [40 %-50 %] 50 % (11/05 0830) Weight:  [99 kg (218 lb 4.1 oz)] 99 kg (218 lb 4.1 oz) (11/05 0446)  Hemodynamic parameters for last 24 hours:    Intake/Output from previous day: 11/04 0701 - 11/05 0700 In: 3040 [I.V.:240; NG/GT:2400; IV Piggyback:400] Out: 3350 [Urine:3350] Intake/Output this shift: Total I/O In: 180 [I.V.:20; NG/GT:160] Out: 500 [Urine:500]  General appearance: alert and cooperative Neurologic: intact Heart: irregularly irregular rhythm Lungs: diminished breath sounds LLL and RLL Abdomen: soft, non-tender; bowel sounds normal; no masses,  no organomegaly Extremities: edema mild  Wound: incision well-healed  Lab Results:  Recent Labs  11/13/15 0433 11/14/15 0410  WBC 12.9* 13.8*  HGB 8.2* 8.2*  HCT 26.5* 26.4*  PLT 304 291   BMET:  Recent Labs  11/13/15 0433 11/14/15 0410  NA 144 142  K 4.2 4.1  CL 111 107  CO2 27 28  GLUCOSE 135* 124*  BUN 50* 51*  CREATININE 1.63* 1.73*  CALCIUM 7.7* 7.8*    PT/INR: No results for input(s): LABPROT, INR in the last 72 hours. ABG    Component Value Date/Time   PHART 7.381 11/04/2015 0330   HCO3 30.7 (H) 11/04/2015 0330   TCO2 31 11/05/2015 1803   ACIDBASEDEF 1.0 10/29/2015 1531   O2SAT 91.3 11/04/2015 0330   CBG (last 3)   Recent Labs  11/13/15 2319 11/14/15 0327 11/14/15 0901  GLUCAP 168* 113* 137*    Assessment/Plan: S/P Procedure(s) (LRB): CORONARY ARTERY BYPASS GRAFTING (CABG) x3(LIMA to LA, SVG to OM1, SVG to PDA) with EVH from the left thigh and partial lower leg greater saphenous vein and left internal mammary artery (N/A) TRANSESOPHAGEAL ECHOCARDIOGRAM (TEE) (N/A) INTRA-AORTIC BALLOON PUMP INSERTION size 40cc (Left)  Acute hypoxic respiratory failure: weaning vent as tolerated per CCM. CXR shows bilateral effusion and decreased aeration on the right. procalcitonin and venous lactic acid are low and WBC minimally elevated, afebrile. More likely atelectasis and effusion than pneumonia. Repeat sputum, urine and blood cultures sent yesterday and pending. I suspect he probably has significant bilateral pleural effusions responsible for haziness in lower lobes and vascular crowding. CCM could do US guided thoracentesis which may help him.  Rate controlled atrial fibrillation on digoxin, lopressor and diltiazem  MRSE bacteremia: on Vanc day 5. Repeat BC negative 10/31.  Delirium with agitation at times. On seroquel and prn Haldol. He seems much better yesterday and today.  Chronic kidney disease: creat up again today and I think this is due to diuresis. His weight is about at preop. Lasix stopped.  Anemia: stable.  Tolerating tube feeds  LOS: 20 days    Mark Rivers 11/14/2015

## 2015-11-14 NOTE — Progress Notes (Signed)
PCCM PROGRESS NOTE   Admission date:  10/25/2015 Consult date:  10/27/2015 Referring provider:  Dr. Prescott Gum  CC:  Short of breath  Subjective:  Now on ATC. Looks comfortable   Vital signs: BP (!) 122/59   Pulse 79   Temp 99 F (37.2 C) (Oral)   Resp (!) 23   Ht 5\' 6"  (1.676 m)   Wt 218 lb 4.1 oz (99 kg)   SpO2 95%   BMI 35.23 kg/m    Intake/output: I/O last 3 completed shifts: In: [id number] [I.V.:360; NG/GT:3360; IV Piggyback:800] Out: [id number] [Urine:4625]  Hemodynamics:    Vent settings: Vent Mode: PCV FiO2 (%):  [40 %-50 %] 50 % Set Rate:  [16 bmp] 16 bmp PEEP:  [5 cmH20] 5 cmH20 Plateau Pressure:  [13 cmH20-15 cmH20] 14 cmH20  Intake/Output Summary (Last 24 hours) at 11/14/15 0846 Last data filed at 11/14/15 0800  Gross per 24 hour  Intake             2950 ml  Output             3850 ml  Net             -900 ml   Physcial Exam: General: no distress on ATC Eyes closed. No family at bedside.  Integument:  Warm & dry. No rash on exposed skin.  HEENT: NG tube within right nare. No scleral icterus. Tracheostomy in place. Cardiovascular: Irregularly irregular rhythm. Atrial fibrillation on telemetry with normal but normal rate. Mild edema in lower extremities. Pulmonary:  Normal work of breathing on ATC. Clear on auscultation. Abdomen: Soft. Normoactive bowel sounds. Protuberant.  Musculoskeletal:  No joint deformity or effusion appreciated. Does have anasarca  Neurological:  Sedated. Opens eyes with stimulation then falls back asleep. Pupils symmetric.  LABS: CMP Latest Ref Rng & Units 11/14/2015 11/13/2015 11/12/2015  Glucose 65 - 99 mg/dL 124(H) 135(H) 118(H)  BUN 6 - 20 mg/dL 51(H) 50(H) 39(H)  Creatinine 0.61 - 1.24 mg/dL 1.73(H) 1.63(H) 1.25(H)  Sodium 135 - 145 mmol/L 142 144 148(H)  Potassium 3.5 - 5.1 mmol/L 4.1 4.2 3.8  Chloride 101 - 111 mmol/L 107 111 117(H)  CO2 22 - 32 mmol/L 28 27 27   Calcium 8.9 - 10.3 mg/dL 7.8(L) 7.7(L) 8.1(L)  Total Protein 6.5  - 8.1 g/dL 5.3(L) 5.3(L) 5.0(L)  Total Bilirubin 0.3 - 1.2 mg/dL 0.9 1.0 1.0  Alkaline Phos 38 - 126 U/L 68 61 62  AST 15 - 41 U/L 56(H) 53(H) 63(H)  ALT 17 - 63 U/L 42 41 43    CBC Latest Ref Rng & Units 11/14/2015 11/13/2015 11/12/2015  WBC 4.0 - 10.5 K/uL 13.8(H) 12.9(H) 15.8(H)  Hemoglobin 13.0 - 17.0 g/dL 8.2(L) 8.2(L) 7.4(L)  Hematocrit 39.0 - 52.0 % 26.4(L) 26.5(L) 24.3(L)  Platelets 150 - 400 K/uL 291 304 391    Recent Labs Lab 11/11/15 0340 11/12/15 0334 11/13/15 0433 11/13/15 1159 11/13/15 1330 11/14/15 0410  PROCALCITON  --   --   --  0.19  --  0.17  WBC 16.3* 15.8* 12.9*  --   --  13.8*  LATICACIDVEN  --   --   --   --  0.8  --        ABG    Component Value Date/Time   PHART 7.381 11/04/2015 0330   PCO2ART 52.9 (H) 11/04/2015 0330   PO2ART 65.5 (L) 11/04/2015 0330   HCO3 30.7 (H) 11/04/2015 0330   TCO2 31 11/05/2015 1803   ACIDBASEDEF  1.0 10/29/2015 1531   O2SAT 91.3 11/04/2015 0330    CBG (last 3)   Recent Labs  11/13/15 1921 11/13/15 2319 11/14/15 0327  GLUCAP 109* 168* 113*    Imaging: Dg Chest Port 1 View  Result Date: 11/14/2015 CLINICAL DATA:  Pleural effusion, history diabetes mellitus, hypertension, coronary artery disease, GERD EXAM: PORTABLE CHEST 1 VIEW COMPARISON:  Portable exam 0530 hours compared to 11/13/2015 FINDINGS: Tracheostomy tube, feeding tube and RIGHT arm PICC line unchanged. Enlargement of cardiac silhouette post CABG. Pulmonary vascular congestion. Persistent pulmonary edema and LEFT pleural effusion. Lung bases excluded, particularly on RIGHT. LEFT lower lobe atelectasis versus consolidation. No pneumothorax. IMPRESSION: Enlargement of cardiac silhouette post CABG with pulmonary vascular congestion and question pulmonary edema. LEFT pleural effusion with atelectasis versus consolidation LEFT lower lobe. Electronically Signed   By: Lavonia Dana M.D.   On: 11/14/2015 08:03   Dg Chest Port 1 View  Result Date:  11/13/2015 CLINICAL DATA:  Hx of CABG; SOB EXAM: PORTABLE CHEST 1 VIEW COMPARISON:  11/11/2015 FINDINGS: There is left greater than right lung base opacity most likely combination of pleural effusions and atelectasis. Right lung base opacity has mildly increased from the prior study. Mild interstitial thickening is noted in the lung bases similar to the prior exam. Remainder of the lungs is clear. No pneumothorax. Tracheostomy tube, enteric feeding tube and right PICC are stable. IMPRESSION: 1. Apparent mild increase in opacity at the right lung base. This may reflect an increased pleural fluid/atelectasis. It could potentially be an artifact related to patient positioning. 2. No other evidence of a change. There are left greater than right pleural effusions with associated basilar atelectasis. There is mild interstitial prominence, but no overt pulmonary edema. Electronically Signed   By: Lajean Manes M.D.   On: 11/13/2015 07:41  bilateral  Effusion vs atx Trach good position  Studies:  Chest [place] 10/26: moderate effusion rt with significant compressive atx, small eff to mod left CT Chest/Abd/Pelvis W/O 10/30: IMPRESSION: 1. Probable congestive heart failure. Patchy consolidation in both lower lobes, left greater than right, worrisome for superimposed pneumonia. 2.  Aortic atherosclerosis (ICD10-170.0). 3. Enlarged subcarinal lymph node, likely reactive. 4. Asbestos related pleural disease. TTE 10/31:  LV with normal size & EF 50-55%. Normal wall motion. Insufficient to assess diastolic function. LA & RA normal in size. RV normal in size and function. No aortic stenosis with trivial regurgitation. Mild mitral regurgitation without stenosis. Possible [name] is linear projection originating from mitral annular calcification within the left atrium noted. No pulmonic stenosis. No tricuspid regurgitation. No pericardial effusion.  Microbiology: MRSA PCR 10/18:  Negative  Tracheal Asp Ctx 10/18:  Oral  Flora Blood Ctx x2 10/18:  Negative Tracheal Asp Ctx 10/19:  Oral Flora Tracheal Asp Ctx 10/24:  Oral Flora Blood Ctx 10/24:  1/2 Bottles MRSE Blood Ctx 10/26: 1/2 Bottles Coag Neg Staph  C diff 10/28:  Negative  Right Pleural Effusion 10/27:  Negative  Tracheal Asp Ctx 10/30:  Normal respiratory flora Blood Ctx x2 10/31 >> Tracheal aspirate 11/4>>>  Antibiotics: Levaquin 10/17 - 10/24 Aztreonam 10/24 - 10/29 Vancomycin 10/17 - 10/18; 10/25 >>  Significant Events: 10/17 - Admit, cardiac cath, emergent CABG due to NSTEMI. 10/18 - 0600 PCCM consulted for refractory hypoxia 10/25 - reintubated for increased WOB and hypoxia.  10/26 - trached by DF  Lines/Tubes: OETT 10/17 - 10/26 L Rad Art Line 10/17 - 10/26 R IJ PA Cat 10/17 - out CT x3 10/17 -  last removed 10/29 Trach (DF) 10/26 >> RUE DL PICC 10/30 >> R NGT 10/26 >> PIV x1  Assessment/Plan:  81 y.o. male presented with increasing dyspnea and a fib with RVR.  Found to have 99% Lt main CAD and had emergent CABG 10/17. Major barrier at this point is likely: deconditioning, critical care malnutrition and delirium. The delirium seems better. CXR w/ bilateral ATX/effusion. Fever curve not increased, WBC up a little, pct negative, sputum is pending. But he looks comfortable on ATC. Will hold off on widening abx for now. His  Creatinine is rising w/ lasix so we stopped it.  I think his largest barrier at this point is malnutrition and deconditioning. Will cont ATC able. Will defer to thoracic surgery but think that [place] of chest and possible thoracentesis would be reasonable IF significant pleural fluid. Would look bilaterally. Otherwise nothing else to add for now other than cont supportive care.   Acute Hypoxic Respiratory Failure:  S/P Trach for vocal cord edema & failure to wean. Right >Left atelectasis/effusion R/o HCAP w/ worsening r sided airspace disease and fever (Worseing aeration on CXR 11/4) Severe deconditioning and protein  calorie malnutrition - cont ATC and PSV as tolerated - repeat sputum culture pending -  consider [place] of chest and possible thora (defer to Dr Darcey Nora) \ MRSE Bacteremia:  - Continuing empiric Vancomycin Day #6/14.  - Repeat blood cultures performed 10/31 still negative to date -  Defer to Dr. Prescott Gum on necessity of TEE given mitral valve abnormality.   Acute Encephalopathy/Delirium:  - continue to monitor QTc. -  Seroquel 50mg  bid. - Haldol IV prn. Holding off on Benzos/Xanax.    NSTEMI:  S/P CABG on 10/17.  Atrial Fibrillation w/ RVR:  1Chronic Diastolic Congestive Heart Failure:  Grade 2 dysfunction on 10/24 echocardiogram - Management per Dr. Prescott Gum. - Continuing ASA 325mg , Lipitor 80mg , & Lopressor bid.   Acute on Chronic Renal Failure Stage II: had resolved, creatinine again rising. Worried that this could be lasix Hypernatremia: Hyperchloremia: - hold lasix - Continuing Free Water 50cc q1hr via tube. - Trending electrolytes daily.   Anemia:  No signs of active bleeding. Transfused 1u PRBC 11/3 per Dr. Prescott Gum.  - Trending cell counts daily w/ CBC.  Diarrhea:  C diff negative 10/28. No evidence of worsening.  Diabetes Mellitus Type II:  Glucose controlled.  - Continuing SSI per algorithm with accu-checks q4hr. Continuing to hold Levemir.   H/O Moderate COPD:  No signs of exacerbation. -  Continuing Xopenex nebs tid & Pulmicort neb bid.  H/O GERD: -   Continuing Pepcid VT daily.    Diet:  Continuing & tolerating tube feedings.  Prophylaxis:  Lovenox [place] q24hr, Pepcid VT daily, & SCDs.   Erick Colace ACNP-BC [place] Pager # [phone] OR # [phone] if no answer

## 2015-11-15 ENCOUNTER — Inpatient Hospital Stay (HOSPITAL_COMMUNITY): Payer: Medicare Other

## 2015-11-15 LAB — CBC
HCT: 26.2 % — ABNORMAL LOW (ref 39.0–52.0)
Hemoglobin: 8 g/dL — ABNORMAL LOW (ref 13.0–17.0)
MCH: 30.7 pg (ref 26.0–34.0)
MCHC: 30.5 g/dL (ref 30.0–36.0)
MCV: 100.4 fL — ABNORMAL HIGH (ref 78.0–100.0)
PLATELETS: 282 10*3/uL (ref 150–400)
RBC: 2.61 MIL/uL — ABNORMAL LOW (ref 4.22–5.81)
RDW: 17.5 % — AB (ref 11.5–15.5)
WBC: 11.2 10*3/uL — ABNORMAL HIGH (ref 4.0–10.5)

## 2015-11-15 LAB — COMPREHENSIVE METABOLIC PANEL
ALK PHOS: 69 U/L (ref 38–126)
ALT: 44 U/L (ref 17–63)
ANION GAP: 7 (ref 5–15)
AST: 47 U/L — ABNORMAL HIGH (ref 15–41)
Albumin: 1.9 g/dL — ABNORMAL LOW (ref 3.5–5.0)
BUN: 49 mg/dL — ABNORMAL HIGH (ref 6–20)
CALCIUM: 8 mg/dL — AB (ref 8.9–10.3)
CHLORIDE: 105 mmol/L (ref 101–111)
CO2: 29 mmol/L (ref 22–32)
Creatinine, Ser: 1.67 mg/dL — ABNORMAL HIGH (ref 0.61–1.24)
GFR calc non Af Amer: 37 mL/min — ABNORMAL LOW (ref >60)
GFR, EST AFRICAN AMERICAN: 43 mL/min — AB (ref >60)
Glucose, Bld: 116 mg/dL — ABNORMAL HIGH (ref 65–99)
Potassium: 4 mmol/L (ref 3.5–5.1)
SODIUM: 141 mmol/L (ref 135–145)
Total Bilirubin: 0.6 mg/dL (ref 0.3–1.2)
Total Protein: 5.4 g/dL — ABNORMAL LOW (ref 6.5–8.1)

## 2015-11-15 LAB — GLUCOSE, CAPILLARY
Glucose-Capillary: 124 mg/dL — ABNORMAL HIGH (ref 65–99)
Glucose-Capillary: 113 mg/dL — ABNORMAL HIGH (ref 65–99)
Glucose-Capillary: 137 mg/dL — ABNORMAL HIGH (ref 65–99)
Glucose-Capillary: 100 mg/dL — ABNORMAL HIGH (ref 65–99)
Glucose-Capillary: 105 mg/dL — ABNORMAL HIGH (ref 65–99)
Glucose-Capillary: 121 mg/dL — ABNORMAL HIGH (ref 65–99)

## 2015-11-15 LAB — VANCOMYCIN, RANDOM: Vancomycin Rm: 19

## 2015-11-15 LAB — CULTURE, RESPIRATORY: GRAM STAIN: NONE SEEN

## 2015-11-15 LAB — MAGNESIUM: MAGNESIUM: 2.3 mg/dL (ref 1.7–2.4)

## 2015-11-15 LAB — CULTURE, RESPIRATORY W GRAM STAIN: Culture: NORMAL

## 2015-11-15 LAB — PROCALCITONIN: Procalcitonin: 0.12 ng/mL

## 2015-11-15 MED ORDER — FLUCONAZOLE IN SODIUM CHLORIDE 100-0.9 MG/50ML-% IV SOLN
100.0000 mg | INTRAVENOUS | Status: AC
  Administered 2015-11-15 – 2015-11-17 (×3): 100 mg via INTRAVENOUS
  Filled 2015-11-15 (×5): qty 50

## 2015-11-15 MED ORDER — PRO-STAT SUGAR FREE PO LIQD
60.0000 mL | Freq: Three times a day (TID) | ORAL | Status: DC
  Administered 2015-11-15 – 2015-11-22 (×18): 60 mL
  Filled 2015-11-15 (×19): qty 60

## 2015-11-15 MED ORDER — DILTIAZEM 12 MG/ML ORAL SUSPENSION
60.0000 mg | Freq: Two times a day (BID) | ORAL | Status: DC
  Administered 2015-11-15 – 2015-11-23 (×18): 60 mg via ORAL
  Filled 2015-11-15 (×20): qty 6

## 2015-11-15 MED ORDER — JEVITY 1.2 CAL PO LIQD
1000.0000 mL | ORAL | Status: DC
  Administered 2015-11-15: 1000 mL
  Administered 2015-11-16: 40 mL/h
  Administered 2015-11-17: 1000 mL
  Administered 2015-11-18: 11:00:00
  Administered 2015-11-21: 1000 mL
  Administered 2015-11-22: 40 mL
  Filled 2015-11-15 (×12): qty 1000

## 2015-11-15 MED ORDER — VANCOMYCIN HCL 10 G IV SOLR
1500.0000 mg | Freq: Once | INTRAVENOUS | Status: AC
  Administered 2015-11-15: 1500 mg via INTRAVENOUS
  Filled 2015-11-15: qty 1500

## 2015-11-15 NOTE — Progress Notes (Signed)
Trach water bottle and setup changed.  Tolerated well.

## 2015-11-15 NOTE — Progress Notes (Signed)
-  Returned patient to ventilator after RN called RT to bedside -noting distress and fatigue on 35% trach collar. Patient is tolerating well at this time.

## 2015-11-15 NOTE — Progress Notes (Signed)
20 Days Post-Op Procedure(s) (LRB): CORONARY ARTERY BYPASS GRAFTING (CABG) x3(LIMA to LA, SVG to OM1, SVG to PDA) with EVH from the left thigh and partial lower leg greater saphenous vein and left internal mammary artery (N/A) TRANSESOPHAGEAL ECHOCARDIOGRAM (TEE) (N/A) INTRA-AORTIC BALLOON PUMP INSERTION size 40cc (Left) Subjective: On trach collar No more fever vanc level still high  Objective: Vital signs in last 24 hours: Temp:  [98.8 F (37.1 C)-100.9 F (38.3 C)] 99.1 F (37.3 C) (11/06 0814) Pulse Rate:  [55-98] 79 (11/06 0758) Cardiac Rhythm: Atrial fibrillation (11/06 0400) Resp:  [17-27] 21 (11/06 0758) BP: (104-149)/(46-91) 127/68 (11/06 0758) SpO2:  [96 %-100 %] 99 % (11/06 0758) FiO2 (%):  [50 %] 50 % (11/06 0758) Weight:  [218 lb 4.1 oz (99 kg)] 218 lb 4.1 oz (99 kg) (11/06 0500)  Hemodynamic parameters for last 24 hours:  controlled afib  Intake/Output from previous day: 11/05 0701 - 11/06 0700 In: 2430 [I.V.:240; NG/GT:2190] Out: 3700 [Urine:3700] Intake/Output this shift: No intake/output data recorded.  Alert, responsive Mild secretions Mild edema abd soft  Lab Results:  Recent Labs  11/14/15 0410 11/15/15 0400  WBC 13.8* 11.2*  HGB 8.2* 8.0*  HCT 26.4* 26.2*  PLT 291 282   BMET:  Recent Labs  11/14/15 0410 11/15/15 0400  NA 142 141  K 4.1 4.0  CL 107 105  CO2 28 29  GLUCOSE 124* 116*  BUN 51* 49*  CREATININE 1.73* 1.67*  CALCIUM 7.8* 8.0*    PT/INR: No results for input(s): LABPROT, INR in the last 72 hours. ABG    Component Value Date/Time   PHART 7.381 11/04/2015 0330   HCO3 30.7 (H) 11/04/2015 0330   TCO2 31 11/05/2015 1803   ACIDBASEDEF 1.0 10/29/2015 1531   O2SAT 91.3 11/04/2015 0330   CBG (last 3)   Recent Labs  11/14/15 2339 11/15/15 0353 11/15/15 0811  GLUCAP 146* 113* 137*    Assessment/Plan: S/P Procedure(s) (LRB): CORONARY ARTERY BYPASS GRAFTING (CABG) x3(LIMA to LA, SVG to OM1, SVG to PDA) with EVH from  the left thigh and partial lower leg greater saphenous vein and left internal mammary artery (N/A) TRANSESOPHAGEAL ECHOCARDIOGRAM (TEE) (N/A) INTRA-AORTIC BALLOON PUMP INSERTION size 40cc (Left) goal- OOB to chair Start cardizem via tube, stop drip Change trach to longer cannula per CCM  LOS: 21 days    Tharon Aquas Trigt III 11/15/2015

## 2015-11-15 NOTE — Progress Notes (Signed)
Nutrition Follow Up  DOCUMENTATION CODES:   Obesity unspecified  INTERVENTION:    Continue Jevity 1.2 formula at goal rate of 40 ml/hr   Prostat liquid protein 60 ml TID via tube   Total TF regimen to provide 1752 kcals, 143 gm protein, 775 ml of free water  NUTRITION DIAGNOSIS:   Inadequate oral intake related to inability to eat as evidenced by NPO status, ongoing  GOAL:   Patient will meet greater than or equal to 90% of their needs, met  MONITOR:   TF tolerance, Vent status, Labs, Weight trends, Skin, I & O's  ASSESSMENT:   81 yo WM with PMH significant for but not limited to, CAD with RCA stent 1988, IDDM,HTN, GERD; who has had increasing DOE for 3 weeks prior to admit. 10/16 he presented with acute chest pain, increased SOB and new Afib with RVR. CC revealed 99% Left main and he was taken urgently to CABGx 3 per CVTS 10/17 1730 and presented to SICU 2230.   Patient s/p procedure 10/16: CORONARY ARTERY BYPASS GRAFTING (CABG) x 3  INTRA-AORTIC BALLOON PUMP INSERTION  Patient is currently on ventilator support >> trach MV: 9.7 L/min Temp (24hrs), Avg:99.1 F (37.3 C), Min:98 F (36.7 C), Max:100.9 F (38.3 C)  Pt presented 10/17 with NSTEMI >> required urgent surgery. Developed severe hypoxia in AM 10/18. Per RN pt with vomiting episodes >> Vital High Protein D/C'd Jevity 1.2 formula currently infusing at 40 ml/hr via CORTRAK feeding tube (tip in duodenum).  Free water flushes at 50 ml every hour.  10/20: Verbal with readback order received per Dr. Prescott Gum to manage TF  Diet Order:  Diet NPO time specified   CBG (last 3)   Recent Labs  11/15/15 0353 11/15/15 0811 11/15/15 1325  GLUCAP 113* 137* 100*   Skin:  Reviewed, no issues  Last BM:  11/2  Height:   Ht Readings from Last 1 Encounters:  10/27/15 '5\' 6"'  (1.676 m)    Weight:   Wt Readings from Last 1 Encounters:  11/15/15 218 lb 4.1 oz (99 kg)    11/05  218 lb 11/04  219  lb 11/03  218 lb 11/02  218 lb 11/01  218 lb 10/31  220 lb 10/30  221 lb 10/29  224 lb 10/28  229 lb 10/27  235 lb 10/26  231 lb 10/25  239 lb 10/24  240 lb 10/23  240 lb  Ideal Body Weight:  59 kg  BMI:  Body mass index is 35.23 kg/m.  Estimated Nutritional Needs:   Kcal:  1700-1900  Protein:  130-140 gm  Fluid:  per MD  EDUCATION NEEDS:   No education needs identified at this time  [name], RD, LDN Pager #: [phone] After-Hours Pager #: [phone]

## 2015-11-15 NOTE — Progress Notes (Signed)
Patient taken off of ventilator and placed on 50% trach collar.  Currently tolerating well.  Will continue to monitor.

## 2015-11-15 NOTE — Plan of Care (Signed)
Problem: Respiratory: Goal: Ability to tolerate decreased levels of ventilator support will improve Outcome: Progressing Patient on TC wean all day today  Problem: Skin Integrity: Goal: Risk for impaired skin integrity will decrease Outcome: Not Progressing Pt with significant MASD present, interventions in place to help

## 2015-11-15 NOTE — Care Management Important Message (Signed)
Important Message  Patient Details  Name: Mark Rivers MRN: [id number] Date of Birth: Mar 17, 1934   Medicare Important Message Given:  Yes    [name] 11/15/2015, 11:37 AM

## 2015-11-15 NOTE — Progress Notes (Signed)
Pharmacy Antibiotic Note  Mark Rivers is a 81 y.o. male continues on vancomycin day #12 for pneumonia and MRSE bacteremia.  Level obtained on 11/2 was slightly below goal and vancomycin dose was increased. 11/5 vancomycin trough was supratherapeutic at 34 (drawn correctly). Renal function showing slight improvement Cr 1.25 > 1.63 > 1.73>1.6. Random vancomycin level this am is down within therapeutic level at 19. Uop has actually been pretty good past couple of days at 1.53ml/kg/hr last 24 hours.  Will plan on giving 1500mg  this afternoon x 1 and recheck random level in 48 hours to ensure patient is clearing. Calculated trough level at 48 hour mark is 15 with a peak of 36.   Vancomycin trough goal 15-20  Plan: 1) Hold vancomycin this morning and given 1x dose this afternoon 2) Vancomycin 1500mg  IV x1 this afternoon  3) Check vancomycin random level on 11/8 to ensure clearing   Height: 5\' 6"  (167.6 cm) Weight: 218 lb 4.1 oz (99 kg) IBW/kg (Calculated) : 63.8  Temp (24hrs), Avg:99.5 F (37.5 C), Min:98.8 F (37.1 C), Max:100.9 F (38.3 C)   Recent Labs Lab 11/11/15 0340 11/11/15 0945 11/12/15 0334 11/13/15 0433 11/13/15 1330 11/14/15 0410 11/14/15 1037 11/15/15 0400  WBC 16.3*  --  15.8* 12.9*  --  13.8*  --  11.2*  CREATININE 1.10  --  1.25* 1.63*  --  1.73*  --  1.67*  LATICACIDVEN  --   --   --   --  0.8  --   --   --   VANCOTROUGH  --  14*  --   --   --   --  20*  --   VANCORANDOM  --   --   --   --   --   --   --  19    Estimated Creatinine Clearance: 39.5 mL/min (by C-G formula based on SCr of 1.67 mg/dL (H)).    Allergies  Allergen Reactions  . Amoxicillin Other (See Comments)    PATIENT PASSES OUT!!!!  . Cephalexin Other (See Comments)    PATIENT PASSES OUT!!!!  . Penicillin G Other (See Comments)    Has patient had a PCN reaction causing immediate rash, facial/tongue/throat swelling, SOB or lightheadedness with hypotension: Yes Has patient had a PCN  reaction causing severe rash involving mucus membranes or skin necrosis: No Has patient had a PCN reaction that required hospitalization: Yes Has patient had a PCN reaction occurring within the last 10 years: Yes If all of the above answers are "NO", then may proceed with Cephalosporin use. PATIENT PASSES OUT!!!!   Antimicrobials this admission:  Levofloxacin 10/17>>10/23 Vancomycin 10/17>>10/18 Aztreonam 10/24>>10/30 Vancomycin 10/25>>  Dose adjustments this admission:  10/31 VT = 12 on 1250 q24 >> 750mg  q12  11/2 VT = 14 mcg/mL 750mg  q12 (checked prior to 3rd dose) >> 1gm q12 115 VT = 34 mcg/mL 1gm q12 >> hold 11/6 VR = 19  Microbiology results:  10/18 MRSA PCR - negative 10/18, 10/19, 10/24, 10/30 TA - negative 10/18 Blood x 2: neg 10/24 BCx: CoNS 1 of 2 (BCID MRSE, Vanc MIC = 1) 10/26 BCx: CoNS 1 of 2 (BCID MRSE) 10/27 R pleural fluid - negative 10/28 Cdiff: negative 10/31 BCx x2: NGTD 11/4 resp:reincubated 11/4 urine:ngF 11/4 blood x 2: ngtd  Erin Hearing PharmD., BCPS Clinical Pharmacist Pager [phone] 11/15/2015 8:48 AM

## 2015-11-15 NOTE — Progress Notes (Signed)
Patient ID: Mark Rivers, male   DOB: February 15, 1934, 81 y.o.   MRN: [id number] EVENING ROUNDS NOTE :     [address]       [place] [address]             [phone]                 20 Days Post-Op Procedure(s) (LRB): CORONARY ARTERY BYPASS GRAFTING (CABG) x3(LIMA to LA, SVG to OM1, SVG to PDA) with EVH from the left thigh and partial lower leg greater saphenous vein and left internal mammary artery (N/A) TRANSESOPHAGEAL ECHOCARDIOGRAM (TEE) (N/A) INTRA-AORTIC BALLOON PUMP INSERTION size 40cc (Left)  Total Length of Stay:  LOS: 21 days  BP (!) 128/56   Pulse (!) 111   Temp 97.8 F (36.6 C) (Oral)   Resp (!) 27   Ht 5\' 6"  (1.676 m)   Wt 218 lb 4.1 oz (99 kg)   SpO2 95%   BMI 35.23 kg/m   .Intake/Output      11/05 0701 - 11/06 0700 11/06 0701 - 11/07 0700   I.V. (mL/kg) 240 (2.4) 5 (0.1)   NG/GT 2190 950   IV Piggyback  550   Total Intake(mL/kg) 2430 (24.5) 1505 (15.2)   Urine (mL/kg/hr) 3700 (1.6) 1475 (1.3)   Stool 0 (0)    Total Output 3700 1475   Net -1270 +30             Lab Results  Component Value Date   WBC 11.2 (H) 11/15/2015   HGB 8.0 (L) 11/15/2015   HCT 26.2 (L) 11/15/2015   PLT 282 11/15/2015   GLUCOSE 116 (H) 11/15/2015   TRIG 268 (H) 11/05/2015   ALT 44 11/15/2015   AST 47 (H) 11/15/2015   NA 141 11/15/2015   K 4.0 11/15/2015   CL 105 11/15/2015   CREATININE 1.67 (H) 11/15/2015   BUN 49 (H) 11/15/2015   CO2 29 11/15/2015   TSH 3.494 10/26/2015   INR 1.35 11/03/2015   Stable day On trach collar  [name]  Beeper [phone] Office [phone] 11/15/2015 6:38 PM

## 2015-11-15 NOTE — Progress Notes (Addendum)
Physical Therapy Treatment Patient Details Name: Mark Rivers MRN: [id number] DOB: 20-Jun-1934 Today's Date: 11/15/2015    History of Present Illness 81 yo with admission for chest pain, Afib with RVR s/p CABGx3 PMhx: CAD, CKD, and COPD     PT Comments    Pt with improved balance in sitting and improved attempts at standing but unable to remain standing more than 20 sec. Pt educated for HEP as well as transfers and encouraged to continue to perform. Pt's daughter and brother in room throughout session. Pt and family educated for all sternal precautions throughout session and unable to recall. Will continue to follow pt to maximize function.   HR 76 sats 100% on venturi 50% throughout BP 133/61 before and 138/69 after mobility  Follow Up Recommendations  SNF;Supervision/Assistance - 24 hour     Equipment Recommendations       Recommendations for Other Services       Precautions / Restrictions Precautions Precautions: Fall;Sternal Precaution Comments: trach, flexiseal, panda    Mobility  Bed Mobility Overal bed mobility: Needs Assistance Bed Mobility: Rolling;Sidelying to Sit Rolling: Mod assist Sidelying to sit: Max assist       General bed mobility comments: cues for sequence with assist to rotate trunk and pelvis as well as elevate trunk from surface  Transfers Overall transfer level: Needs assistance   Transfers: Sit to/from Stand Sit to Stand: Max assist;+2 physical assistance;+2 safety/equipment         General transfer comment: pt able to stand from bed x 3 trials with height elevated and assist of belt and pad to provide anterior translation and rise from surface. 15, 5, 20 sec respectively in standing for each trial. Cues for posture and to look up to attempt to extend trunk. Unable to weight shift. Sitting EOB used maxisky to transition bed to chair  Ambulation/Gait             General Gait Details: unable   Stairs            Wheelchair  Mobility    Modified Rankin (Stroke Patients Only)       Balance Overall balance assessment: Needs assistance   Sitting balance-Leahy Scale: Fair       Standing balance-Leahy Scale: Zero                      Cognition Arousal/Alertness: Awake/alert Behavior During Therapy: WFL for tasks assessed/performed Overall Cognitive Status: Impaired/Different from baseline Area of Impairment: Safety/judgement;Memory     Memory: Decreased recall of precautions;Decreased short-term memory   Safety/Judgement: Decreased awareness of deficits          Exercises General Exercises - Lower Extremity Long Arc Quad: AROM;Both;10 reps;Seated Hip Flexion/Marching: AAROM;Both;10 reps;Seated    General Comments        Pertinent Vitals/Pain Pain Assessment: No/denies pain    Home Living                      Prior Function            PT Goals (current goals can now be found in the care plan section) Progress towards PT goals: Progressing toward goals (slowly)    Frequency           PT Plan Current plan remains appropriate    Co-evaluation             End of Session Equipment Utilized During Treatment: Gait belt;Oxygen Activity Tolerance: Patient tolerated treatment well Patient  left: in chair;with call bell/phone within reach;with chair alarm set;with family/visitor present     Time: [id number] PT Time Calculation (min) (ACUTE ONLY): 35 min  Charges:  $Therapeutic Exercise: 8-22 mins $Therapeutic Activity: 8-22 mins                    G Codes:      Melford Aase 11-21-2015, 12:52 PM  Elwyn Reach, [place]

## 2015-11-15 NOTE — Progress Notes (Signed)
PULMONARY / CRITICAL CARE MEDICINE   Name: Mark Rivers MRN: [id number] DOB: [birth year]    ADMISSION DATE:  10/25/2015 CONSULTATION DATE:  10/27/15  Referring provider:  Dr. Prescott Gum  CC:  Short of breath  Brief history 81 y.o. male presented with increasing dyspnea and a fib with RVR.  Found to have 99% Lt main CAD and had emergent CABG 10/17.  SUBJECTIVE:  Pt still having fevers over the weekend. Fluconazole added by Dr Prescott Gum today. Vanc held due to high levels. Difficult to wean on trach collar, still requiring vent. Copious secretions requiring suctioning ever 30 min per nursing. Delirium somewhat improved but still sundowning. Pt has no complaints today.  VITAL SIGNS: BP 127/68   Pulse 79   Temp 99.1 F (37.3 C) (Oral)   Resp (!) 21   Ht 5\' 6"  (1.676 m)   Wt 99 kg (218 lb 4.1 oz)   SpO2 99%   BMI 35.23 kg/m   HEMODYNAMICS:    VENTILATOR SETTINGS: Vent Mode: PCV FiO2 (%):  [50 %] 50 % Set Rate:  [16 bmp] 16 bmp PEEP:  [5 cmH20] 5 cmH20 Plateau Pressure:  [14 cmH20-18 cmH20] 18 cmH20  INTAKE / OUTPUT: I/O last 3 completed shifts: In: [id number] [I.V.:360; NG/GT:3390; IV Piggyback:400] Out: 5750 [Urine:5750]  PHYSICAL EXAMINATION: General:  Resting in bed, NAD Neuro:  Awake, alert, responds appropriated, CN II-VII grossly intact HEENT:  PEERL, EOMI, [place]/AT, trach collar site clean Cardiovascular:  rrr no mrg, mild lower extremity edema Lungs:  Coarse breath sounds bilaterally, good respiratory effort, mild wheezing Abdomen:  Soft, non-tender, non distended, bowel sounds present  Musculoskeletal:  Moves all 4 extremities Skin:  Warm, dry, excoriations on buttocks  LABS:  BMET  Recent Labs Lab 11/13/15 0433 11/14/15 0410 11/15/15 0400  NA 144 142 141  K 4.2 4.1 4.0  CL 111 107 105  CO2 27 28 29   BUN 50* 51* 49*  CREATININE 1.63* 1.73* 1.67*  GLUCOSE 135* 124* 116*    Electrolytes  Recent Labs Lab 11/13/15 0433 11/14/15 0410 11/15/15 0400   CALCIUM 7.7* 7.8* 8.0*  MG 2.3 2.2 2.3    CBC  Recent Labs Lab 11/13/15 0433 11/14/15 0410 11/15/15 0400  WBC 12.9* 13.8* 11.2*  HGB 8.2* 8.2* 8.0*  HCT 26.5* 26.4* 26.2*  PLT 304 291 282    Coag's No results for input(s): APTT, INR in the last 168 hours.  Sepsis Markers  Recent Labs Lab 11/13/15 1159 11/13/15 1330 11/14/15 0410 11/15/15 0400  LATICACIDVEN  --  0.8  --   --   PROCALCITON 0.19  --  0.17 0.12    ABG No results for input(s): PHART, PCO2ART, PO2ART in the last 168 hours.  Liver Enzymes  Recent Labs Lab 11/13/15 0433 11/14/15 0410 11/15/15 0400  AST 53* 56* 47*  ALT 41 42 44  ALKPHOS 61 68 69  BILITOT 1.0 0.9 0.6  ALBUMIN 1.9* 1.9* 1.9*    Cardiac Enzymes No results for input(s): TROPONINI, PROBNP in the last 168 hours.  Glucose  Recent Labs Lab 11/14/15 0901 11/14/15 1544 11/14/15 1929 11/14/15 2339 11/15/15 0353 11/15/15 0811  GLUCAP 137* 124* 138* 146* 113* 137*    Imaging Dg Chest Port 1 View  Result Date: 11/15/2015 CLINICAL DATA:  Respiratory failure, tracheostomy patient. History of coronary artery disease with stent placement as well as CABG. , former smoker. EXAM: PORTABLE CHEST 1 VIEW COMPARISON:  Portable chest x-ray of November 14, 2015 FINDINGS: The lungs are  adequately inflated. The pulmonary vascularity is less engorged. The pulmonary interstitial markings are not as prominent. There remain small bilateral pleural effusions greatest on the left. The sternal wires are intact. The endotracheal tube tip projects at the inferior margin of the clavicular heads. The right internal jugular venous catheter tip projects over the junction of the proximal and midportions of the SVC. IMPRESSION: Improved appearance of the pulmonary interstitium with decreased interstitial edema. Persistent small bilateral pleural effusions. Electronically Signed   By: David  Martinique M.D.   On: 11/15/2015 07:19   Studies:  Chest [place] 10/26: moderate  effusion rt with significant compressive atx, small eff to mod left CT Chest/Abd/Pelvis W/O 10/30: IMPRESSION: 1. Probable congestive heart failure. Patchy consolidation in both lower lobes, left greater than right, worrisome for superimposed pneumonia. 2. Aortic atherosclerosis (ICD10-170.0). 3. Enlarged subcarinal lymph node, likely reactive. 4. Asbestos related pleural disease. TTE 10/31:  LV with normal size & EF 50-55%. Normal wall motion. Insufficient to assess diastolic function. LA & RA normal in size. RV normal in size and function. No aortic stenosis with trivial regurgitation. Mild mitral regurgitation without stenosis. Possible [name] is linear projection originating from mitral annular calcification within the left atrium noted. No pulmonic stenosis. No tricuspid regurgitation. No pericardial effusion.   Microbiology: MRSA PCR 10/18:  Negative  Tracheal Asp Ctx 10/18:  Oral Flora Blood Ctx x2 10/18:  Negative Tracheal Asp Ctx 10/19:  Oral Flora Tracheal Asp Ctx 10/24:  Oral Flora Blood Ctx 10/24:  1/2 Bottles MRSE Blood Ctx 10/26: 1/2 Bottles Coag Neg Staph  C diff 10/28:  Negative  Right Pleural Effusion 10/27:  Negative  Tracheal Asp Ctx 10/30:  Normal respiratory flora Blood Ctx x2 10/31 >> no growth, final Tracheal aspirate 11/4>>> culture reincubated for better growth, still pending Urine 11/4 >>> no growth  Blood clx x2 11/4 >> no growth, still pending  Antibiotics: Levaquin 10/17 - 10/24 Aztreonam 10/24 - 10/29 Vancomycin 10/17 - 10/18; 10/25 >>> Fluconazole 11/6 >>> stop date 11/9  Significant Events: 10/17 - Admit, cardiac cath, emergent CABG due to NSTEMI. 10/18 - 0600 PCCM consulted for refractory hypoxia 10/25 - reintubated for increased WOB and hypoxia.  10/26 - trached by DF  Lines/Tubes: OETT 10/17 - 10/26 L Rad Art Line 10/17 - 10/26 R IJ PA Cat 10/17 - out CT x3 10/17 - last removed 10/29 Trach (DF) 10/26 >> RUE DL PICC 10/30 >> R NGT  10/26 >> PIV x1  DISCUSSION: 81 y.o. male presented with increasing dyspnea and a fib with RVR.  Found to have 99% Lt main CAD and had emergent CABG 10/17. Major barrier at this point is likely: deconditioning, critical care malnutrition and delirium. The delirium seems better. CXR w/ bilateral ATX/effusion. Fever curve not increased, WBC down a little, pct negative, sputum is pending. Creatinine improving. Cont supportive care.   Assessment/Plan:   Acute Hypoxic Respiratory Failure:  S/P Trach for vocal cord edema & failure to wean. Right >Left atelectasis/effusion R/o HCAP w/ worsening r sided airspace disease and fever (Worseing aeration on CXR 11/4) Severe deconditioning and protein calorie malnutrition - cont ATC and PSV as tolerated - repeat sputum culture pending - CXR 11/6 shows Improved appearance of the pulmonary interstitium with decreased interstitial edema. Persistent small bilateral pleural effusions.  MRSE Bacteremia:  - Vancomycin held today for high trough levels - Repeat blood cultures performed 11/4 still negative to date  Acute Encephalopathy/Delirium:  - continue to monitor QTc, 456 today -  Seroquel 50mg  bid. - Haldol IV prn. Holding off on Benzos/Xanax.   NSTEMI:  S/P CABG on 10/17.  Atrial Fibrillation w/ RVR:  -Chronic Diastolic Congestive Heart Failure:  Grade 2 dysfunction on 10/24 echocardiogram - Management per Dr. Prescott Gum. - Continuing ASA 325mg , Lipitor 80mg , & Lopressor bid.   Acute on Chronic Renal Failure Stage II:  Elevated Creatinine  Hypernatremia: improving Hyperchloremia: improving - hold lasix - Continuing Free Water 50cc q1hr via tube. - Trending electrolytes daily.   Anemia:   No signs of active bleeding. Transfused 1u PRBC 11/3 per Dr. Prescott Gum.  - Trending cell counts daily w/ CBC. WBC decreasing  Diarrhea:  C diff negative 10/28. No evidence of worsening.  Diabetes Mellitus Type II:  Glucose controlled.  - Continuing  SSI per algorithm with accu-checks q4hr. Continuing to hold Levemir.  H/O Moderate COPD:  No signs of exacerbation. -  Continuing Xopenex nebs tid & Pulmicort neb bid.  H/O GERD: -   Continuing Pepcid VT daily.    Diet:  Continuing & tolerating tube feedings.  Prophylaxis:  Lovenox [place] q24hr, Pepcid VT daily, & SCDs.  FAMILY  - Updates: no family at bed side this morning   Quin Hoop, MS4 Attending Dr. Titus Mould  Pulmonary and [place] Pager: [phone]  11/15/2015, 8:27 AM \  STAFF NOTE: I, Merrie Roof, MD FACP have personally reviewed patient's available data, including medical history, events of note, physical examination and test results as part of my evaluation. I have discussed with resident/NP and other care providers such as pharmacist, RN and RRT. In addition, I personally evaluated patient and elicited key findings of: awake in bed, follows commands well, reduced BS left base, trach clean, currently on TC, RR wnl, understood had low volumes overweekend, if hs is successful with TC then no need to change to xLT, if back to vent significant would change him to XLT to ensure volumjes are adaquate, will drop cuff for a fw min , nO PMV with current secretion status, pcxr with effusion left, lasix held, crt to follow, likley need even balance goals at least, vanc to continued, repeat BC are neg, role TEE? For duration abx, TF tolerating, vanc continue, I updated family The patient is critically ill with multiple organ systems failure and requires high complexity decision making for assessment and support, frequent evaluation and titration of therapies, application of advanced monitoring technologies and extensive interpretation of multiple databases.   Critical Care Time devoted to patient care services described in this note is 30  Minutes. This time reflects time of care of this signee: Merrie Roof, MD FACP. This critical care  time does not reflect procedure time, or teaching time or supervisory time of PA/NP/Med student/Med Resident etc but could involve care discussion time. Rest per NP/medical resident whose note is outlined above and that I agree with   Lavon Paganini. Titus Mould, MD, [place] Pgr: [facility] 11/15/2015 9:43 AM

## 2015-11-16 ENCOUNTER — Inpatient Hospital Stay (HOSPITAL_COMMUNITY): Payer: Medicare Other

## 2015-11-16 LAB — GLUCOSE, CAPILLARY
Glucose-Capillary: 117 mg/dL — ABNORMAL HIGH (ref 65–99)
Glucose-Capillary: 120 mg/dL — ABNORMAL HIGH (ref 65–99)
Glucose-Capillary: 122 mg/dL — ABNORMAL HIGH (ref 65–99)
Glucose-Capillary: 122 mg/dL — ABNORMAL HIGH (ref 65–99)
Glucose-Capillary: 144 mg/dL — ABNORMAL HIGH (ref 65–99)
Glucose-Capillary: 144 mg/dL — ABNORMAL HIGH (ref 65–99)
Glucose-Capillary: 190 mg/dL — ABNORMAL HIGH (ref 65–99)

## 2015-11-16 LAB — BASIC METABOLIC PANEL
Anion gap: 9 (ref 5–15)
BUN: 42 mg/dL — ABNORMAL HIGH (ref 6–20)
CO2: 28 mmol/L (ref 22–32)
Calcium: 8.3 mg/dL — ABNORMAL LOW (ref 8.9–10.3)
Chloride: 106 mmol/L (ref 101–111)
Creatinine, Ser: 1.27 mg/dL — ABNORMAL HIGH (ref 0.61–1.24)
GFR calc Af Amer: >60 mL/min (ref >60)
GFR calc non Af Amer: 52 mL/min — ABNORMAL LOW (ref >60)
Glucose, Bld: 131 mg/dL — ABNORMAL HIGH (ref 65–99)
Potassium: 4.1 mmol/L (ref 3.5–5.1)
Sodium: 143 mmol/L (ref 135–145)

## 2015-11-16 LAB — CBC
HCT: 26.3 % — ABNORMAL LOW (ref 39.0–52.0)
Hemoglobin: 8.1 g/dL — ABNORMAL LOW (ref 13.0–17.0)
MCH: 30.5 pg (ref 26.0–34.0)
MCHC: 30.8 g/dL (ref 30.0–36.0)
MCV: 98.9 fL (ref 78.0–100.0)
Platelets: 290 10*3/uL (ref 150–400)
RBC: 2.66 MIL/uL — ABNORMAL LOW (ref 4.22–5.81)
RDW: 17 % — ABNORMAL HIGH (ref 11.5–15.5)
WBC: 11.6 10*3/uL — ABNORMAL HIGH (ref 4.0–10.5)

## 2015-11-16 LAB — MAGNESIUM: MAGNESIUM: 2.3 mg/dL (ref 1.7–2.4)

## 2015-11-16 MED ORDER — FUROSEMIDE 10 MG/ML IJ SOLN
40.0000 mg | Freq: Once | INTRAMUSCULAR | Status: AC
  Administered 2015-11-16: 40 mg via INTRAVENOUS
  Filled 2015-11-16: qty 4

## 2015-11-16 NOTE — Clinical Social Work Note (Signed)
Pt received bed offers. [facility] can accommodate trac. Patient must be on suction every 4 hours or greater, no less than 4 hours. Per RN currently pt is suctioned every 2 hours. Per RN pt's trac will be changed today to a longer trac to fit the patient's body better. RN predicts that pt will be transferred out of step down in a week. CSW will continue to follow.  [address], [place]

## 2015-11-16 NOTE — Progress Notes (Signed)
SLP Cancellation Note  Patient Details Name: Mark Rivers MRN: [id number] DOB: Dec 20, 1934   Cancelled treatment:       Reason Eval/Treat Not Completed: Other (comment) Pt with increased secretions per chart review, with MD note from previous date requesting to hold PMV given current amount of secretions. SLP discussed pt with RN, who says he presents similarly today. Plan is to change to extra long trach this afternoon. SLP will f/u to assess tolerance of PMV after trach change has been made. Requested that RN continue to hold PMV trials until SLP can reassess given increased secretions and now larger trach since last SLP visit.   [name] 11/16/2015, 2:38 PM  [name], M.A. CCC-SLP [phone]

## 2015-11-16 NOTE — Progress Notes (Signed)
Patient placed on 35% trach collar.  Currently tolerating well.  Will continue to monitor.  

## 2015-11-16 NOTE — Progress Notes (Signed)
Patient tolerating 40% trach collar, with minimal distress after suctioning. Patient not placed on ventilator at this time. RT will continue to monitor.

## 2015-11-16 NOTE — Progress Notes (Addendum)
PULMONARY / CRITICAL CARE MEDICINE   Name: Colten Hutchings MRN: [id number] DOB: 09-08-1934    ADMISSION DATE:  10/25/2015 CONSULTATION DATE:  10/27/15  Referring provider: Dr. Prescott Gum  [id number] of breath  Brief history 81 y.o.male presented with increasing dyspnea and a fib with RVR. Found to have 99% Lt main CAD and had emergent CABG 10/17.  SUBJECTIVE: Up in chair, on trach collar, no acute events over night, no complaints today  VITAL SIGNS: BP (!) 154/59 (BP Location: Left Arm)   Pulse 92   Temp 99.1 F (37.3 C)   Resp (!) 25   Ht 5\' 6"  (1.676 m)   Wt 97.8 kg (215 lb 9.8 oz)   SpO2 95%   BMI 34.80 kg/m   HEMODYNAMICS:    VENTILATOR SETTINGS: Vent Mode: PCV FiO2 (%):  [35 %-40 %] 35 % Set Rate:  [16 bmp] 16 bmp PEEP:  [5 cmH20] 5 cmH20 Plateau Pressure:  [10 cmH20-16 cmH20] 10 cmH20  INTAKE / OUTPUT: I/O last 3 completed shifts: In: [id number] [I.V.:125; Other:60; NG/GT:3350; IV Piggyback:550] Out: [id number] [Urine:3775]  PHYSICAL EXAMINATION: General:  NAD, sitting in chair Neuro:  Awake, alert, responds appropriately HEENT:  PEERL, [place]/AT Cardiovascular:  RRR, no mrg, mild LE edema Lungs:  Good inspiratory effort on trach collar, symmetric chest wall motion, coarse sounds but clear Abdomen: soft, non tender, bs present Musculoskeletal:  Moves all 4 extremities Skin:  Warm, dry, intact  LABS:  BMET  Recent Labs Lab 11/14/15 0410 11/15/15 0400 11/16/15 0440  NA 142 141 143  K 4.1 4.0 4.1  CL 107 105 106  CO2 28 29 28   BUN 51* 49* 42*  CREATININE 1.73* 1.67* 1.27*  GLUCOSE 124* 116* 131*    Electrolytes  Recent Labs Lab 11/14/15 0410 11/15/15 0400 11/16/15 0440  CALCIUM 7.8* 8.0* 8.3*  MG 2.2 2.3 2.3    CBC  Recent Labs Lab 11/14/15 0410 11/15/15 0400 11/16/15 0440  WBC 13.8* 11.2* 11.6*  HGB 8.2* 8.0* 8.1*  HCT 26.4* 26.2* 26.3*  PLT 291 282 290    Coag's No results for input(s): APTT, INR in the last 168  hours.  Sepsis Markers  Recent Labs Lab 11/13/15 1159 11/13/15 1330 11/14/15 0410 11/15/15 0400  LATICACIDVEN  --  0.8  --   --   PROCALCITON 0.19  --  0.17 0.12    ABG No results for input(s): PHART, PCO2ART, PO2ART in the last 168 hours.  Liver Enzymes  Recent Labs Lab 11/13/15 0433 11/14/15 0410 11/15/15 0400  AST 53* 56* 47*  ALT 41 42 44  ALKPHOS 61 68 69  BILITOT 1.0 0.9 0.6  ALBUMIN 1.9* 1.9* 1.9*    Cardiac Enzymes No results for input(s): TROPONINI, PROBNP in the last 168 hours.  Glucose  Recent Labs Lab 11/15/15 1325 11/15/15 1605 11/15/15 1943 11/16/15 0012 11/16/15 0350 11/16/15 0825  GLUCAP 100* 105* 121* 144* 120* 122*    Imaging Dg Chest Port 1 View  Result Date: 11/16/2015 CLINICAL DATA:  .  Tracheostomy, respiratory failure EXAM: PORTABLE CHEST 1 VIEW COMPARISON:  11/15/2015 FINDINGS: Cardiomediastinal silhouette is stable. Status post median sternotomy. Stable tracheostomy and NG tube position. Central mild vascular congestion and mild perihilar interstitial prominence consistent with pulmonary edema. Right arm PICC line with tip in SVC. Persistent small left pleural effusion with left basilar atelectasis or infiltrate. IMPRESSION: Status post median sternotomy. Stable tracheostomy and NG tube position. Central mild vascular congestion and mild perihilar interstitial prominence consistent with pulmonary  edema. Right arm PICC line with tip in SVC. Persistent small left pleural effusion with left basilar atelectasis or infiltrate. Electronically Signed   By: Lahoma Crocker M.D.   On: 11/16/2015 09:21     Studies: Chest [place] 10/26: moderate effusion rt with significant compressive atx, small eff to mod left CT Chest/Abd/Pelvis W/O 10/30: IMPRESSION: 1. Probable congestive heart failure. Patchy consolidation in both lower lobes, left greater than right, worrisome for superimposed pneumonia. 2. Aortic atherosclerosis (ICD10-170.0). 3. Enlarged  subcarinal lymph node, likely reactive. 4. Asbestos related pleural disease. TTE 10/31:LV with normal size &EF 50-55%. Normal wall motion. Insufficient to assess diastolic function. LA & RA normal in size. RV normal in size and function. No aortic stenosis with trivial regurgitation. Mild mitral regurgitation without stenosis. Possible [name] is linear projection originating from mitral annular calcification within the left atrium noted. No pulmonic stenosis. No tricuspid regurgitation. No pericardial effusion.  Microbiology: MRSA PCR 10/18: Negative  Tracheal Asp Ctx 10/18: Oral Flora Blood Ctx x2 10/18: Negative Tracheal Asp Ctx 10/19: Oral Flora Tracheal Asp Ctx 10/24: Oral Flora Blood Ctx 10/24: 1/2 Bottles MRSE Blood Ctx 10/26: 1/2 Bottles Coag Neg Staph  C diff 10/28: Negative  Right Pleural Effusion 10/27: Negative  Tracheal Asp Ctx 10/30: Normal respiratory flora, final Blood Ctx x2 10/31 >> no growth, final Tracheal aspirate 11/4>>> normal resp flora, final Urine 11/4 >>> no growth, final Blood clx x2 11/4 >> no growth, still pending  Antibiotics: Levaquin 10/17 - 10/24 Aztreonam 10/24 - 10/29 Vancomycin 10/17 - 10/18; 10/25 >>> Fluconazole 11/6 >>> stop date 11/9  Significant Events: 10/17 - Admit, cardiac cath, emergent CABG due to NSTEMI. 10/18 - 0600 PCCM consulted for refractory hypoxia 10/25 - reintubated for increased WOB and hypoxia.  10/26 - trached by DF  Lines/Tubes: OETT 10/17 - 10/26 L Rad Art Line 10/17 - 10/26 R IJ PA Cat 10/17 - out CT x3 10/17 - last removed 10/29 Trach (DF) 10/26 >> RUE DL PICC 10/30 >> R NGT 10/26 >> PIV x1  DISCUSSION: 81 y.o.male presented with increasing dyspnea and a fib with RVR. Found to have 99% Lt main CAD and had emergent CABG 10/17. Major barrier at this point is likely: deconditioning, critical care malnutrition and delirium. The delirium seems better. CXR w/ bilateral ATX/effusion. Fever curve not  increased, WBC down a little, pct negative, sputum is pending. Creatinine improving. Cont supportive care. Pt improving  Assessment/Plan: Acute Hypoxic Respiratory Failure: S/P Trach for vocal cord edema &failure to wean. Right >Left atelectasis/effusion R/o HCAP w/ worsening r sided airspace disease and fever (Worseing aeration on CXR 11/4) Severe deconditioning and protein calorie malnutrition - cont ATC and PSV as tolerated - repeat sputum culture negative - CXR 11/7 stable from prior  MRSE Bacteremia:  - Vancomycin - Repeat blood cultures performed 11/4 still negative to date  Acute Encephalopathy/Delirium:  - continue to monitor QTc - Seroquel 50mg  bid. - Haldol IV prn. Holding off on Benzos/Xanax.   NSTEMI: S/P CABG on 10/17.  Atrial Fibrillation w/ RVR:  -Chronic Diastolic Congestive Heart Failure: Grade 2 dysfunction on 10/24 echocardiogram - Management per Dr. Prescott Gum. - Continuing ASA 325mg , Lipitor 80mg , &Lopressor bid.   Acute on Chronic Renal Failure Stage II:  Elevated Creatinine, improving Hypernatremia: improving Hyperchloremia: improving - hold lasix - Continuing Free Water 50cc q1hr via tube. - Trending electrolytes daily.   Anemia: - Trending cell counts daily w/ CBC. WBC stable  Diarrhea: C diff negative 10/28. No  evidence of worsening.  Diabetes Mellitus Type [id number] controlled.  - Continuing SSI per algorithm with accu-checks q4hr. Continuing to hold Levemir.  H/O Moderate COPD:No signs of exacerbation. - Continuing Xopenex nebs tid &Pulmicort neb bid.  H/O GERD: - Continuing Pepcid VT daily.   Diet: Continuing &tolerating tube feedings.  Prophylaxis: Lovenox [place] q24hr, Pepcid VT daily, &SCDs.  FAMILY  - Updates: family updated at bed side this morning and questions addressed  Quin Hoop, MS4 Attending Dr. Titus Mould  Pulmonary and [place] Pager: [phone]  11/16/2015, 9:36 AM   STAFF NOTE: I, Merrie Roof, MD FACP have personally reviewed patient's available data, including medical history, events of note, physical examination and test results as part of my evaluation. I have discussed with resident/NP and other care providers such as pharmacist, RN and RRT. In addition, I personally evaluated patient and elicited key findings of: awake, alert, no distress son TC, increase crackles and pos 2.6 liters, crt is down, volume status should be different then renal insuff , would want him to be even to neg balance next 24 hours, would want to consider concentration of all fluids and if needed add lasix back intermittent, allow diflucan to dc, vanc to continued for staph epi concerns, since he still needs vent night, will change to xtra long trach this afternoon 6 distal with cuff, re attempt 24 hr trach collar, consider move to sdu vent bed  Lavon Paganini. Titus Mould, MD, [place] Pgr: [facility] 11/16/2015 10:46 AM

## 2015-11-16 NOTE — Progress Notes (Signed)
[facility] BRIEF SICU PROGRESS NOTE  21 Days Post-Op  S/P Procedure(s) (LRB): CORONARY ARTERY BYPASS GRAFTING (CABG) x3(LIMA to LA, SVG to OM1, SVG to PDA) with EVH from the left thigh and partial lower leg greater saphenous vein and left internal mammary artery (N/A) TRANSESOPHAGEAL ECHOCARDIOGRAM (TEE) (N/A) INTRA-AORTIC BALLOON PUMP INSERTION size 40cc (Left)   Stable day  Plan: Continue current plan  Rexene Alberts, MD 11/16/2015 6:23 PM

## 2015-11-16 NOTE — Progress Notes (Signed)
11/16/2015 11:01 AM EPW d/c per order and per protocol. Ends intact. Pt. Tolerated well. Advised BR x 1 HR. Call bell within reach. VS collected per protocol. Will continue to monitor patient.  Tariya Morrissette, Arville Lime

## 2015-11-17 DIAGNOSIS — L8990 Pressure ulcer of unspecified site, unspecified stage: Secondary | ICD-10-CM | POA: Insufficient documentation

## 2015-11-17 LAB — GLUCOSE, CAPILLARY
Glucose-Capillary: 111 mg/dL — ABNORMAL HIGH (ref 65–99)
Glucose-Capillary: 134 mg/dL — ABNORMAL HIGH (ref 65–99)
Glucose-Capillary: 134 mg/dL — ABNORMAL HIGH (ref 65–99)
Glucose-Capillary: 136 mg/dL — ABNORMAL HIGH (ref 65–99)
Glucose-Capillary: 138 mg/dL — ABNORMAL HIGH (ref 65–99)
Glucose-Capillary: 139 mg/dL — ABNORMAL HIGH (ref 65–99)

## 2015-11-17 LAB — VANCOMYCIN, TROUGH: Vancomycin Tr: 9 ug/mL — ABNORMAL LOW (ref 15–20)

## 2015-11-17 LAB — BASIC METABOLIC PANEL
ANION GAP: 7 (ref 5–15)
BUN: 42 mg/dL — ABNORMAL HIGH (ref 6–20)
CHLORIDE: 107 mmol/L (ref 101–111)
CO2: 28 mmol/L (ref 22–32)
Calcium: 8.3 mg/dL — ABNORMAL LOW (ref 8.9–10.3)
Creatinine, Ser: 1.24 mg/dL (ref 0.61–1.24)
GFR calc non Af Amer: 54 mL/min — ABNORMAL LOW (ref >60)
Glucose, Bld: 132 mg/dL — ABNORMAL HIGH (ref 65–99)
Potassium: 3.9 mmol/L (ref 3.5–5.1)
Sodium: 142 mmol/L (ref 135–145)

## 2015-11-17 LAB — PHOSPHORUS: Phosphorus: 3.2 mg/dL (ref 2.5–4.6)

## 2015-11-17 LAB — MAGNESIUM: Magnesium: 2.2 mg/dL (ref 1.7–2.4)

## 2015-11-17 MED ORDER — FENTANYL CITRATE (PF) 100 MCG/2ML IJ SOLN
50.0000 ug | INTRAMUSCULAR | Status: DC | PRN
  Administered 2015-11-17 – 2015-11-24 (×13): 50 ug via INTRAVENOUS
  Filled 2015-11-17 (×13): qty 2

## 2015-11-17 MED ORDER — ORAL CARE MOUTH RINSE
15.0000 mL | OROMUCOSAL | Status: DC
  Administered 2015-11-18 – 2015-11-25 (×30): 15 mL via OROMUCOSAL

## 2015-11-17 MED ORDER — ACETYLCYSTEINE 20 % IN SOLN
4.0000 mL | Freq: Two times a day (BID) | RESPIRATORY_TRACT | Status: AC
  Administered 2015-11-17 – 2015-11-18 (×4): 4 mL via RESPIRATORY_TRACT
  Filled 2015-11-17 (×4): qty 4

## 2015-11-17 MED ORDER — WARFARIN - PHYSICIAN DOSING INPATIENT
Freq: Every day | Status: DC
  Administered 2015-11-25: 18:00:00

## 2015-11-17 MED ORDER — WARFARIN SODIUM 5 MG PO TABS
5.0000 mg | ORAL_TABLET | Freq: Every day | ORAL | Status: DC
  Administered 2015-11-17 – 2015-11-18 (×2): 5 mg via ORAL
  Filled 2015-11-17 (×2): qty 1

## 2015-11-17 MED ORDER — VANCOMYCIN HCL IN DEXTROSE 1-5 GM/200ML-% IV SOLN
1000.0000 mg | INTRAVENOUS | Status: DC
  Administered 2015-11-17 – 2015-11-19 (×3): 1000 mg via INTRAVENOUS
  Filled 2015-11-17 (×4): qty 200

## 2015-11-17 NOTE — Progress Notes (Signed)
Speech Language Pathology Treatment: Mark Rivers Speaking valve  Patient Details Name: Mark Rivers MRN: [id number] DOB: 17-May-1934 Today's Date: 11/17/2015 Time: [id number] SLP Time Calculation (min) (ACUTE ONLY): 17 min  Assessment / Plan / Recommendation Clinical Impression  Pt's cuff was deflated by SLP and PMV placed. VS remained stable. Pt's cough remains weak and no secretions were expelled. He is intelligible at the conversation level and displayed no difficulty with PMV wear. Per chart review and RN reporting a recent increase need for suctioning, recommend PMV use with full supervision with staff. Cuff remained deflated upon SLP exit. Will continue to follow for PMV tolerance.   HPI HPI: 80 y.o.male presented with increasing dyspnea and a fib with RVR. Found to have 99% Lt main CAD and had emergent CABG 10/17. He remained intubated 10/17-10/26 until trach placed. PMH includes CAD, HTN, GERD, DM      SLP Plan  Continue with current plan of care     Recommendations  Medication Administration: Via alternative means      Patient may use Passy-Muir Speech Valve: Intermittently with supervision PMSV Supervision: Full         Oral Care Recommendations: Oral care QID Follow up Recommendations: Other (comment) (tba) Plan: Continue with current plan of care       [place], Student SLP  Shela Leff 11/17/2015, 3:36 PM

## 2015-11-17 NOTE — Progress Notes (Signed)
Physical Therapy Treatment Patient Details Name: Mark Rivers MRN: [id number] DOB: 05/29/1934 Today's Date: 11/17/2015    History of Present Illness 81 yo with admission for chest pain, Afib with RVR s/p CABGx3 PMhx: CAD, CKD, and COPD     PT Comments    RN indicates pt has been up in chair for a couple hours already this morning and pt indicates fatigue.  Attempted coming to stand x2 with pt only able to achieve squat position before fatiguing out.  Pt's O2 sats on trach collar remained in mid 90's throughout session and HR only just over 100 with effort.  Continue to feel pt will need SNF level of care at D/C.  Will continue to follow.    Follow Up Recommendations  SNF     Equipment Recommendations  Rolling walker with 5" wheels;3in1 (PT)    Recommendations for Other Services       Precautions / Restrictions Precautions Precautions: Fall;Sternal Precaution Comments: trach, flexiseal, panda Restrictions Weight Bearing Restrictions: No    Mobility  Bed Mobility Overal bed mobility: Needs Assistance Bed Mobility: Rolling Rolling: Mod assist         General bed mobility comments: cues for bending knees and not using his UEs to pull on bed.    Transfers Overall transfer level: Needs assistance Equipment used: Rolling walker (2 wheeled) Transfers: Sit to/from Stand Sit to Stand: Max assist;+2 physical assistance;+2 safety/equipment         General transfer comment: Attempted coming to stand x2 from recliner with use of belt and pad under hips.  pt only able to come to squat position before pt seems to give out. and sits on PT and tech.  pt's feet blocked as he tends to slide feet forward with effort of coming to standing.  cues for sternal precautions.  Utilized maxisky for return to bed.    Ambulation/Gait                 Stairs            Wheelchair Mobility    Modified Rankin (Stroke Patients Only)       Balance Overall balance assessment:  Needs assistance Sitting-balance support: Feet supported;Bilateral upper extremity supported (UEs on knees) Sitting balance-Leahy Scale: Poor     Standing balance support: Bilateral upper extremity supported;During functional activity Standing balance-Leahy Scale: Zero                      Cognition Arousal/Alertness: Awake/alert Behavior During Therapy: WFL for tasks assessed/performed Overall Cognitive Status: Impaired/Different from baseline Area of Impairment: Following commands;Attention   Current Attention Level: Sustained   Following Commands: Follows one step commands inconsistently;Follows one step commands with increased time       General Comments: pt with trach, but does attempt to mouth words to PT.      Exercises      General Comments        Pertinent Vitals/Pain Pain Assessment: Faces Faces Pain Scale: Hurts a little bit Pain Location: pt denied pain when asked, but grimaces somewhat with mobility, though question if this is from effort vs truly pain.   Pain Descriptors / Indicators: Grimacing Pain Intervention(s): Monitored during session;Premedicated before session;Repositioned    Home Living                      Prior Function            PT Goals (current goals can now  be found in the care plan section) Acute Rehab PT Goals Patient Stated Goal: return home to wife PT Goal Formulation: With patient Time For Goal Achievement: 11/25/15 Potential to Achieve Goals: Fair Progress towards PT goals: Progressing toward goals    Frequency    Min 3X/week      PT Plan Current plan remains appropriate    Co-evaluation             End of Session Equipment Utilized During Treatment: Gait belt;Oxygen Activity Tolerance: Patient limited by fatigue Patient left: in bed;with call bell/phone within reach;with bed alarm set;with nursing/sitter in room     Time: [id number] PT Time Calculation (min) (ACUTE ONLY): 27  min  Charges:  $Therapeutic Activity: 23-37 mins                    G CodesCatarina Hartshorn, [place] 11/17/2015, 9:17 AM

## 2015-11-17 NOTE — Progress Notes (Signed)
Pt refused CPT. RT will try again later. Pt wants to wait until tomorrow morning.

## 2015-11-17 NOTE — Progress Notes (Signed)
Pharmacy Antibiotic Note  Mark Rivers is a 81 y.o. male continues on vancomycin day #14 for pneumonia and MRSE bacteremia.  Vancomycin trough level this evening is 9, indicating adequate clearance ~ 48 hrs after last dose of 1500 mg.  Vancomycin trough goal 15-20  Plan: 1) Vancomycin 1g IV q 24 hrs for now. 2) Monitor renal function and will adjust dose accordingly.   Height: 5\' 6"  (167.6 cm) Weight: 209 lb 3.5 oz (94.9 kg) IBW/kg (Calculated) : 63.8  Temp (24hrs), Avg:98.5 F (36.9 C), Min:97.4 F (36.3 C), Max:99.6 F (37.6 C)   Recent Labs Lab 11/12/15 0334 11/13/15 0433 11/13/15 1330 11/14/15 0410 11/14/15 1037 11/15/15 0400 11/16/15 0440 11/17/15 0410 11/17/15 1745  WBC 15.8* 12.9*  --  13.8*  --  11.2* 11.6*  --   --   CREATININE 1.25* 1.63*  --  1.73*  --  1.67* 1.27* 1.24  --   LATICACIDVEN  --   --  0.8  --   --   --   --   --   --   VANCOTROUGH  --   --   --   --  34*  --   --   --  9*  VANCORANDOM  --   --   --   --   --  19  --   --   --     Estimated Creatinine Clearance: 52.1 mL/min (by C-G formula based on SCr of 1.24 mg/dL).    Allergies  Allergen Reactions  . Amoxicillin Other (See Comments)    PATIENT PASSES OUT!!!!  . Cephalexin Other (See Comments)    PATIENT PASSES OUT!!!!  . Penicillin G Other (See Comments)    Has patient had a PCN reaction causing immediate rash, facial/tongue/throat swelling, SOB or lightheadedness with hypotension: Yes Has patient had a PCN reaction causing severe rash involving mucus membranes or skin necrosis: No Has patient had a PCN reaction that required hospitalization: Yes Has patient had a PCN reaction occurring within the last 10 years: Yes If all of the above answers are "NO", then may proceed with Cephalosporin use. PATIENT PASSES OUT!!!!   Antimicrobials this admission:  Levofloxacin 10/17>>10/23 Vancomycin 10/17>>10/18 Aztreonam 10/24>>10/30 Vancomycin 10/25>>  Dose adjustments this admission:   10/31 VT = 12 on 1250 q24 >> 750mg  q12  11/2 VT = 14 mcg/mL 750mg  q12 (checked prior to 3rd dose) >> 1gm q12 115 VT = 34 mcg/mL 1gm q12 >> hold 11/6 VR = 19 11/8 VT = 9 > 1g q 24 hrs  Microbiology results:  10/18 MRSA PCR - negative 10/18, 10/19, 10/24, 10/30 TA - negative 10/18 Blood x 2: neg 10/24 BCx: CoNS 1 of 2 (BCID MRSE, Vanc MIC = 1) 10/26 BCx: CoNS 1 of 2 (BCID MRSE) 10/27 R pleural fluid - negative 10/28 Cdiff: negative 10/31 BCx x2: NGTD 11/4 resp:reincubated 11/4 urine:ngF 11/4 blood x 2: ngtd  Erin Hearing PharmD., BCPS Clinical Pharmacist Pager [phone] 11/17/2015 6:46 PM

## 2015-11-17 NOTE — Progress Notes (Signed)
PULMONARY / CRITICAL CARE MEDICINE   Name: Mark Rivers MRN: [id number] DOB: 12-24-1934    ADMISSION DATE:  10/25/2015 CONSULTATION DATE:10/27/15  Referring provider: Dr. Prescott Rivers  [id number] of breath  Brief history 81 y.o.male presented with increasing dyspnea and a fib with RVR. Found to have 99% Lt main CAD and had emergent CABG 10/17.  SUBJECTIVE:  Up in chair, on trach collar. No acute events overnight. Did not require ventilator overnight. Still issues with secretions and per nursing he is talking around the trach. Has bed at [facility] per case mgmt, but must control secretions to >4hr suctioning before they will accept.  VITAL SIGNS: BP (!) 151/69   Pulse 86   Temp 98.6 F (37 C) (Oral)   Resp (!) 26   Ht 5\' 6"  (1.676 m)   Wt 94.9 kg (209 lb 3.5 oz)   SpO2 97%   BMI 33.77 kg/m   HEMODYNAMICS:    VENTILATOR SETTINGS: FiO2 (%):  [35 %-40 %] 40 %  INTAKE / OUTPUT: I/O last 3 completed shifts: In: 2680 [Other:190; NG/GT:2440; IV Piggyback:50] Out: [id number] [Urine:4245]  PHYSICAL EXAMINATION: General:  NAD Neuro:  Awake, alert, responds appropriately HEENT:  [place], [place]/AT Cardiovascular:  RRR no mrg, mild lower extremity edema Lungs: clear, coarse at bases Abdomen:  Soft, nontender, nondistended, bowel sounds present Musculoskeletal:  Moves all 4 extremities Skin:  Warm, dry, intact, excoriations around groin and buttocks healing  LABS:  BMET  Recent Labs Lab 11/15/15 0400 11/16/15 0440 11/17/15 0410  NA 141 143 142  K 4.0 4.1 3.9  CL 105 106 107  CO2 29 28 28   BUN 49* 42* 42*  CREATININE 1.67* 1.27* 1.24  GLUCOSE 116* 131* 132*    Electrolytes  Recent Labs Lab 11/15/15 0400 11/16/15 0440 11/17/15 0410  CALCIUM 8.0* 8.3* 8.3*  MG 2.3 2.3 2.2  PHOS  --   --  3.2    CBC  Recent Labs Lab 11/14/15 0410 11/15/15 0400 11/16/15 0440  WBC 13.8* 11.2* 11.6*  HGB 8.2* 8.0* 8.1*  HCT 26.4* 26.2* 26.3*  PLT 291 282 290     Coag's No results for input(s): APTT, INR in the last 168 hours.  Sepsis Markers  Recent Labs Lab 11/13/15 1159 11/13/15 1330 11/14/15 0410 11/15/15 0400  LATICACIDVEN  --  0.8  --   --   PROCALCITON 0.19  --  0.17 0.12    ABG No results for input(s): PHART, PCO2ART, PO2ART in the last 168 hours.  Liver Enzymes  Recent Labs Lab 11/13/15 0433 11/14/15 0410 11/15/15 0400  AST 53* 56* 47*  ALT 41 42 44  ALKPHOS 61 68 69  BILITOT 1.0 0.9 0.6  ALBUMIN 1.9* 1.9* 1.9*    Cardiac Enzymes No results for input(s): TROPONINI, PROBNP in the last 168 hours.  Glucose  Recent Labs Lab 11/16/15 0825 11/16/15 1158 11/16/15 1551 11/16/15 1924 11/16/15 2345 11/17/15 0349  GLUCAP 122* 144* 117* 122* 190* 138*    Imaging No results found.   Studies: Chest [place] 10/26: moderate effusion rt with significant compressive atx, small eff to mod left CT Chest/Abd/Pelvis W/O 10/30: IMPRESSION: 1. Probable congestive heart failure. Patchy consolidation in both lower lobes, left greater than right, worrisome for superimposed pneumonia. 2. Aortic atherosclerosis (ICD10-170.0). 3. Enlarged subcarinal lymph node, likely reactive. 4. Asbestos related pleural disease. TTE 10/31:LV with normal size &EF 50-55%. Normal wall motion. Insufficient to assess diastolic function. LA & RA normal in size. RV normal in size and  function. No aortic stenosis with trivial regurgitation. Mild mitral regurgitation without stenosis. Possible [name] is linear projection originating from mitral annular calcification within the left atrium noted. No pulmonic stenosis. No tricuspid regurgitation. No pericardial effusion.  Microbiology: MRSA PCR 10/18: Negative  Tracheal Asp Ctx 10/18: Oral Flora Blood Ctx x2 10/18: Negative Tracheal Asp Ctx 10/19: Oral Flora Tracheal Asp Ctx 10/24: Oral Flora Blood Ctx 10/24: 1/2 Bottles MRSE Blood Ctx 10/26: 1/2 Bottles Coag Neg Staph  C diff 10/28:  Negative  Right Pleural Effusion 10/27: Negative  Tracheal Asp Ctx 10/30: Normal respiratory flora, final Blood Ctx x2 10/31 >>no growth, final Tracheal aspirate 11/4>>>normal resp flora, final Urine 11/4 >>>no growth, final Blood clx x2 11/4 >> no growth, still pending  Antibiotics: Levaquin 10/17 - 10/24 Aztreonam 10/24 - 10/29 Vancomycin 10/17 - 10/18; 10/25 >>> Fluconazole 11/6 >>> stop date 11/9  Significant Events: 10/17 - Admit, cardiac cath, emergent CABG due to NSTEMI. 10/18 - 0600 PCCM consulted for refractory hypoxia 10/25 - reintubated for increased WOB and hypoxia.  10/26 - trached by DF  Lines/Tubes: OETT 10/17 - 10/26 L Rad Art Line 10/17 - 10/26 R IJ PA Cat 10/17 - out CT x3 10/17 - last removed 10/29 Trach (DF) 10/26 >> RUE DL PICC 10/30 >> R NGT 10/26 >> PIV x1  DISCUSSION: 81 y.o.male presented with increasing dyspnea and a fib with RVR. Found to have 99% Lt main CAD and had emergent CABG 10/17. Major barrier at this point is likely: deconditioning, critical care malnutrition and delirium. The delirium seems better. CXR w/ bilateral ATX/effusion. Fever curve not increased, WBC downa little, pct negative, sputum is pending. Creatinine improving. Cont supportive care. Pt improving  Assessment/Plan: Acute Hypoxic Respiratory Failure: S/P Trach for vocal cord edema &failure to wean. Right >Left atelectasis/effusion R/o HCAP w/ worsening r sided airspace disease and fever (Worseing aeration on CXR 11/4) Severe deconditioning and protein calorie malnutrition - cont ATC and PSV as tolerated - repeat sputum culture negative - CXR 11/7 stable from prior - SLP eval today  MRSE Bacteremia:  - Vancomycin - Repeat blood cultures performed 11/4still negative to date  Acute Encephalopathy/Delirium- improving:  - continue to monitor QTc - Seroquel 50mg  bid.  NSTEMI: S/P CABG on 10/17.  Atrial Fibrillation w/ RVR:  -Chronic Diastolic  Congestive Heart Failure: Grade 2 dysfunction on 10/24 echocardiogram - Management per Dr. Prescott Rivers. - Continuing ASA 325mg , Lipitor 80mg , &Lopressor bid.   Acute on Chronic Renal Failure Stage II:  Elevated Creatinine, improving Hypernatremia: improving Hyperchloremia: improving - Trending electrolytes daily.   Anemia: - Trending cell counts daily w/ CBC. WBC stable  Diarrhea: C diff negative 10/28. No evidence of worsening.  Diabetes Mellitus Type [id number] controlled.  - Continuing SSI per algorithm with accu-checks q4hr. Continuing to hold Levemir.  H/O Moderate COPD:No signs of exacerbation. - Continuing Xopenex nebs tid &Pulmicort neb bid.  H/O GERD: - Continuing Pepcid VT daily.   Diet: Continuing &tolerating tube feedings.  Prophylaxis: Lovenox  q24hr, Pepcid VT daily, &SCDs.  FAMILY - Updates: no family present bedside this morning   Quin Hoop, MS4 Attending Dr. Titus Mould  Pulmonary and [place] Pager: [phone]  11/17/2015, 8:25 AM   STAFF NOTE: I, Merrie Roof, MD FACP have personally reviewed patient's available data, including medical history, events of note, physical examination and test results as part of my evaluation. I have discussed with resident/NP and other care providers such as pharmacist, RN and RRT.  In addition, I personally evaluated patient and elicited key findings of: tolerating TC, some mucous, has strong cough, as he has remained off vent x 24 hrs will hold off xtra long trach, add mucomyst's to facilitate secretions movement, chest pt also x 24 hrs, , driop cuff when secretions allow, renal fxn is resolving well wioth last 24 hrs neg 1.4 liters, allow neg balance on own, I am inclined to treat empiric endocarditis with vanc, will establish stop date for vanc, he had pos BC 24 and 26th, would not tolerate TEE at this stage, will again review hardware in his body  with pt, feeding through NGT, SLP when secretions allow, PMV if able to drop cuff  Lavon Paganini. Titus Mould, MD, [place] Pgr: [facility] 11/17/2015 11:10 AM

## 2015-11-17 NOTE — Evaluation (Signed)
Clinical/Bedside Swallow Evaluation Patient Details  Name: Mark Rivers MRN: [id number] Date of Birth: 08-Jun-1934  Today's Date: 11/17/2015 Time: SLP Start Time (ACUTE ONLY): [id number] SLP Stop Time (ACUTE ONLY): 1416 SLP Time Calculation (min) (ACUTE ONLY): 17 min  Past Medical History:  Past Medical History:  Diagnosis Date  . Allergy   . Back pain   . Coronary artery disease   . Diabetes mellitus   . GERD (gastroesophageal reflux disease)   . Hypertension   . Neuropathy [facility])    Past Surgical History:  Past Surgical History:  Procedure Laterality Date  . CARDIAC CATHETERIZATION N/A 10/26/2015   Procedure: Left Heart Cath and Coronary Angiography;  Surgeon: Troy Sine, MD;  Location: [facility];  Service: Cardiovascular;  Laterality: N/A;  . cataract surg     bil/2008  . COLONOSCOPY    . CORONARY ARTERY BYPASS GRAFT N/A 10/26/2015   Procedure: CORONARY ARTERY BYPASS GRAFTING (CABG) x3(LIMA to LA, SVG to OM1, SVG to PDA) with EVH from the left thigh and partial lower leg greater saphenous vein and left internal mammary artery;  Surgeon: Ivin Poot, MD;  Location: [facility];  Service: Open Heart Surgery;  Laterality: N/A;  . CORONARY STENT PLACEMENT     1988  . TEE WITHOUT CARDIOVERSION N/A 10/26/2015   Procedure: TRANSESOPHAGEAL ECHOCARDIOGRAM (TEE);  Surgeon: Ivin Poot, MD;  Location: [facility];  Service: Open Heart Surgery;  Laterality: N/A;  . TONSILLECTOMY     HPI:  80 y.o.male presented with increasing dyspnea and a fib with RVR. Found to have 99% Lt main CAD and had emergent CABG 10/17. He remained intubated 10/17-10/26 until trach placed. PMH includes CAD, HTN, GERD, DM   Assessment / Plan / Recommendation Clinical Impression  Pt consumed PO trials of ice chips and thin liquid with PMV in place. He had a single episode of wet vocal quality that cleared with cued cough. Pt's cough is weak, and his vocal quality has low intensity. Recommend he remain NPO  and proceed with instrumental testing to further assess his swallowing given the risks for silent aspiration. Will continue to follow acutely.    Aspiration Risk  Moderate aspiration risk    Diet Recommendation NPO   Medication Administration: Via alternative means    Other  Recommendations Oral Care Recommendations: Oral care QID   Follow up Recommendations Other (comment) (tba)      Frequency and Duration            Prognosis Prognosis for Safe Diet Advancement: Good      Swallow Study   General HPI: 81 y.o.male presented with increasing dyspnea and a fib with RVR. Found to have 99% Lt main CAD and had emergent CABG 10/17. He remained intubated 10/17-10/26 until trach placed. PMH includes CAD, HTN, GERD, DM Type of Study: Bedside Swallow Evaluation Previous Swallow Assessment: none in chart Diet Prior to this Study: NPO Temperature Spikes Noted: No Respiratory Status: Trach;Trach Collar Trach Size and Type: #6;With PMSV in place;Cuff History of Recent Intubation: Yes Length of Intubations (days): 9 days Date extubated: 11/04/15 Behavior/Cognition: Alert;Cooperative;Pleasant mood Oral Care Completed by SLP: No Vision: Functional for self-feeding Self-Feeding Abilities: Able to feed self;Needs assist Patient Positioning: Upright in bed Baseline Vocal Quality: Low vocal intensity Volitional Cough: Weak Volitional Swallow: Able to elicit    Oral/Motor/Sensory Function     Ice Chips Ice chips: Impaired Presentation: Spoon Pharyngeal Phase Impairments: Wet Vocal Quality   Thin Liquid Thin Liquid: Within  functional limits Presentation: Spoon;Cup;Self Fed    Nectar Thick Nectar Thick Liquid: Not tested   Honey Thick Honey Thick Liquid: Not tested   Puree Puree: Not tested   Solid   GO   Solid: Not tested       [name], Student SLP  Shela Leff 11/17/2015,3:32 PM

## 2015-11-18 ENCOUNTER — Inpatient Hospital Stay (HOSPITAL_COMMUNITY): Payer: Medicare Other

## 2015-11-18 LAB — GLUCOSE, CAPILLARY
Glucose-Capillary: 107 mg/dL — ABNORMAL HIGH (ref 65–99)
Glucose-Capillary: 136 mg/dL — ABNORMAL HIGH (ref 65–99)
Glucose-Capillary: 140 mg/dL — ABNORMAL HIGH (ref 65–99)
Glucose-Capillary: 145 mg/dL — ABNORMAL HIGH (ref 65–99)
Glucose-Capillary: 157 mg/dL — ABNORMAL HIGH (ref 65–99)
Glucose-Capillary: 92 mg/dL (ref 65–99)

## 2015-11-18 LAB — PROTIME-INR
INR: 1.3
Prothrombin Time: 16.3 seconds — ABNORMAL HIGH (ref 11.4–15.2)

## 2015-11-18 LAB — CBC
HCT: 27.5 % — ABNORMAL LOW (ref 39.0–52.0)
Hemoglobin: 8.3 g/dL — ABNORMAL LOW (ref 13.0–17.0)
MCH: 30 pg (ref 26.0–34.0)
MCHC: 30.2 g/dL (ref 30.0–36.0)
MCV: 99.3 fL (ref 78.0–100.0)
PLATELETS: 300 10*3/uL (ref 150–400)
RBC: 2.77 MIL/uL — AB (ref 4.22–5.81)
RDW: 16.7 % — ABNORMAL HIGH (ref 11.5–15.5)
WBC: 12.3 10*3/uL — ABNORMAL HIGH (ref 4.0–10.5)

## 2015-11-18 LAB — COMPREHENSIVE METABOLIC PANEL
ALT: 62 U/L (ref 17–63)
AST: 55 U/L — ABNORMAL HIGH (ref 15–41)
Albumin: 1.9 g/dL — ABNORMAL LOW (ref 3.5–5.0)
Alkaline Phosphatase: 76 U/L (ref 38–126)
Anion gap: 7 (ref 5–15)
BUN: 41 mg/dL — ABNORMAL HIGH (ref 6–20)
CALCIUM: 8.3 mg/dL — AB (ref 8.9–10.3)
CHLORIDE: 106 mmol/L (ref 101–111)
CO2: 28 mmol/L (ref 22–32)
CREATININE: 1.07 mg/dL (ref 0.61–1.24)
Glucose, Bld: 169 mg/dL — ABNORMAL HIGH (ref 65–99)
Potassium: 4 mmol/L (ref 3.5–5.1)
Sodium: 141 mmol/L (ref 135–145)
Total Bilirubin: 0.7 mg/dL (ref 0.3–1.2)
Total Protein: 5.8 g/dL — ABNORMAL LOW (ref 6.5–8.1)

## 2015-11-18 LAB — CULTURE, BLOOD (ROUTINE X 2)
CULTURE: NO GROWTH
CULTURE: NO GROWTH

## 2015-11-18 LAB — MAGNESIUM: Magnesium: 2.2 mg/dL (ref 1.7–2.4)

## 2015-11-18 MED ORDER — DIGOXIN 125 MCG PO TABS
0.1250 mg | ORAL_TABLET | Freq: Every day | ORAL | Status: DC
  Administered 2015-11-19 – 2015-11-26 (×8): 0.125 mg via ORAL
  Filled 2015-11-18 (×8): qty 1

## 2015-11-18 MED ORDER — LOPERAMIDE HCL 1 MG/5ML PO LIQD
4.0000 mg | Freq: Four times a day (QID) | ORAL | Status: DC | PRN
Start: 2015-11-18 — End: 2015-11-26
  Administered 2015-11-18 – 2015-11-23 (×2): 4 mg via ORAL
  Filled 2015-11-18 (×3): qty 20

## 2015-11-18 MED ORDER — QUETIAPINE FUMARATE 25 MG PO TABS
25.0000 mg | ORAL_TABLET | Freq: Two times a day (BID) | ORAL | Status: DC
  Administered 2015-11-18 – 2015-11-21 (×8): 25 mg via ORAL
  Filled 2015-11-18 (×8): qty 1

## 2015-11-18 MED ORDER — FUROSEMIDE 10 MG/ML IJ SOLN
40.0000 mg | Freq: Every day | INTRAMUSCULAR | Status: DC
  Administered 2015-11-18 – 2015-11-21 (×4): 40 mg via INTRAVENOUS
  Filled 2015-11-18 (×5): qty 4

## 2015-11-18 NOTE — Progress Notes (Signed)
Physical Therapy Treatment Patient Details Name: Mark Rivers MRN: [id number] DOB: June 18, 1934 Today's Date: 11/18/2015    History of Present Illness 81 yo with admission for chest pain, Afib with RVR s/p CABGx3 PMhx: CAD, CKD, and COPD     PT Comments    Pt with improved ability to stand today but continues to require +2 max assist and unable to maintain standing more than 20 sec to attempt pivot. Pt educated for all precautions 3x during session with ability to recall 1/5 end of session. Pt encouraged to assist with all mobility and perform HEp throughout the day. Will continue to follow.   HR 68-76 BP before 126/73, after 137/64 sats 93-96% on 40% trach collar  Follow Up Recommendations  SNF     Equipment Recommendations       Recommendations for Other Services       Precautions / Restrictions Precautions Precautions: Fall;Sternal Precaution Comments: trach, flexiseal, panda    Mobility  Bed Mobility Overal bed mobility: Needs Assistance Bed Mobility: Rolling Rolling: Min assist         General bed mobility comments: cues for bending knees and direction, min physical assist bil. sit to supine with maxisky  Transfers Overall transfer level: Needs assistance     Sit to Stand: Max assist;+2 physical assistance;+2 safety/equipment         General transfer comment: Pt stood x 2 from recliner for 15 and 20 sec respectively with assist at belt and pad to facilitate anterior translation and rise. Pt with flexed trunk and hips in standing despite assist and cues for upright posture. Cues for hand placement and fatigue limiting transfers. Maxisky for chair to bed  Ambulation/Gait             General Gait Details: unable   Stairs            Wheelchair Mobility    Modified Rankin (Stroke Patients Only)       Balance     Sitting balance-Leahy Scale: Fair       Standing balance-Leahy Scale: Zero                      Cognition  Arousal/Alertness: Awake/alert Behavior During Therapy: WFL for tasks assessed/performed Overall Cognitive Status: Impaired/Different from baseline Area of Impairment: Following commands;Attention   Current Attention Level: Sustained Memory: Decreased recall of precautions;Decreased short-term memory Following Commands: Follows one step commands with increased time       General Comments: pt with trach, mouthing words    Exercises General Exercises - Lower Extremity Short Arc Quad: AROM;Both;10 reps;Supine Heel Slides: AROM;Both;10 reps;Supine    General Comments        Pertinent Vitals/Pain Pain Assessment: No/denies pain    Home Living                      Prior Function            PT Goals (current goals can now be found in the care plan section) Progress towards PT goals: Progressing toward goals    Frequency           PT Plan Current plan remains appropriate    Co-evaluation             End of Session Equipment Utilized During Treatment: Gait belt;Oxygen Activity Tolerance: Patient limited by fatigue Patient left: in bed;with call bell/phone within reach;with bed alarm set     Time: [id number] PT Time Calculation (min) (  ACUTE ONLY): 24 min  Charges:  $Therapeutic Exercise: 8-22 mins $Therapeutic Activity: 8-22 mins                    G Codes:      Melford Aase 23-Nov-2015, 1:39 PM  Elwyn Reach, [place]

## 2015-11-18 NOTE — Progress Notes (Signed)
CT surgery p.m. Rounds  Patient able to talk around tracheostomy Up in chair today Rate controlled atrial fibrillation Improved urine output FEES swallow test at bedside demonstrating aspiration high risk Continue tube feeds

## 2015-11-18 NOTE — Progress Notes (Signed)
PULMONARY / CRITICAL CARE MEDICINE   Name: Mark Rivers MRN: [id number] DOB: [birth year]    ADMISSION DATE:  10/25/2015 CONSULTATION DATE:10/27/15  Referring provider: Dr. Prescott Gum  [id number] of breath  Brief history 81 y.o.male presented with increasing dyspnea and a fib with RVR. Found to have 99% Lt main CAD and had emergent CABG 10/17.  SUBJECTIVE:  Up in chair, on trach collar. To 50% from 60%, refused chest pt  VITAL SIGNS: BP (!) 147/63   Pulse 85   Temp 98.6 F (37 C) (Oral)   Resp (!) 27   Ht 5\' 6"  (1.676 m)   Wt 94 kg (207 lb 3.7 oz)   SpO2 96%   BMI 33.45 kg/m   HEMODYNAMICS:    VENTILATOR SETTINGS: FiO2 (%):  [40 %-60 %] 50 %  INTAKE / OUTPUT: I/O last 3 completed shifts: In: 20 [Other:380; NG/GT:3130; IV Piggyback:200] Out: 3025 [Urine:3025]  PHYSICAL EXAMINATION: General:  NAD, in chair Neuro:  Awake, alert, responds appropriately HEENT:  PEERL, Many/AT Cardiovascular:  s1 s2 RRR Lungs: clear,=with cough Abdomen:  Soft, nontender, nondistended, bowel sounds present Musculoskeletal:  Moves all 4 extremities Skin:  Warm, dry, intact, excoriations unchanged  LABS:  BMET  Recent Labs Lab 11/16/15 0440 11/17/15 0410 11/18/15 0405  NA 143 142 141  K 4.1 3.9 4.0  CL 106 107 106  CO2 28 28 28   BUN 42* 42* 41*  CREATININE 1.27* 1.24 1.07  GLUCOSE 131* 132* 169*    Electrolytes  Recent Labs Lab 11/16/15 0440 11/17/15 0410 11/18/15 0405  CALCIUM 8.3* 8.3* 8.3*  MG 2.3 2.2 2.2  PHOS  --  3.2  --     CBC  Recent Labs Lab 11/15/15 0400 11/16/15 0440 11/18/15 0405  WBC 11.2* 11.6* 12.3*  HGB 8.0* 8.1* 8.3*  HCT 26.2* 26.3* 27.5*  PLT 282 290 300    Coag's  Recent Labs Lab 11/18/15 0405  INR 1.30    Sepsis Markers  Recent Labs Lab 11/13/15 1159 11/13/15 1330 11/14/15 0410 11/15/15 0400  LATICACIDVEN  --  0.8  --   --   PROCALCITON 0.19  --  0.17 0.12    ABG No results for input(s): PHART,  PCO2ART, PO2ART in the last 168 hours.  Liver Enzymes  Recent Labs Lab 11/14/15 0410 11/15/15 0400 11/18/15 0405  AST 56* 47* 55*  ALT 42 44 62  ALKPHOS 68 69 76  BILITOT 0.9 0.6 0.7  ALBUMIN 1.9* 1.9* 1.9*    Cardiac Enzymes No results for input(s): TROPONINI, PROBNP in the last 168 hours.  Glucose  Recent Labs Lab 11/17/15 0900 11/17/15 1218 11/17/15 1625 11/17/15 1940 11/17/15 2351 11/18/15 0352  GLUCAP 134* 139* 111* 136* 134* 145*    Imaging No results found.   Studies: Chest [place] 10/26: moderate effusion rt with significant compressive atx, small eff to mod left CT Chest/Abd/Pelvis W/O 10/30: IMPRESSION: 1. Probable congestive heart failure. Patchy consolidation in both lower lobes, left greater than right, worrisome for superimposed pneumonia. 2. Aortic atherosclerosis (ICD10-170.0). 3. Enlarged subcarinal lymph node, likely reactive. 4. Asbestos related pleural disease. TTE 10/31:LV with normal size &EF 50-55%. Normal wall motion. Insufficient to assess diastolic function. LA & RA normal in size. RV normal in size and function. No aortic stenosis with trivial regurgitation. Mild mitral regurgitation without stenosis. Possible [name] is linear projection originating from mitral annular calcification within the left atrium noted. No pulmonic stenosis. No tricuspid regurgitation. No pericardial effusion.  Microbiology: MRSA PCR 10/18: Negative  Tracheal Asp Ctx 10/18: Oral Flora Blood Ctx x2 10/18: Negative Tracheal Asp Ctx 10/19: Oral Flora Tracheal Asp Ctx 10/24: Oral Flora Blood Ctx 10/24: 1/2 Bottles MRSE Blood Ctx 10/26: 1/2 Bottles Coag Neg Staph  C diff 10/28: Negative  Right Pleural Effusion 10/27: Negative  Tracheal Asp Ctx 10/30: Normal respiratory flora, final Blood Ctx x2 10/31 >>no growth, final Tracheal aspirate 11/4>>>normal resp flora, final Urine 11/4 >>>no growth, final Blood clx x2 11/4 >> no growth, still  pending  Antibiotics: Levaquin 10/17 - 10/24 Aztreonam 10/24 - 10/29 Vancomycin 10/17 - 10/18; 10/25 >>> Fluconazole 11/6 >>> stop date 11/9  Significant Events: 10/17 - Admit, cardiac cath, emergent CABG due to NSTEMI. 10/18 - 0600 PCCM consulted for refractory hypoxia 10/25 - reintubated for increased WOB and hypoxia.  10/26 - trached (DF)  Lines/Tubes: OETT 10/17 - 10/26 L Rad Art Line 10/17 - 10/26 R IJ PA Cat 10/17 - out CT x3 10/17 - last removed 10/29 Trach (DF) 10/26 >> RUE DL PICC 10/30 >> R NGT 10/26 >> PIV x1  DISCUSSION: 81 y.o.male presented with increasing dyspnea and a fib with RVR. Found to have 99% Lt main CAD and had emergent CABG 10/17. Major barrier at this point is likely: deconditioning, critical care malnutrition and delirium. The delirium seems better. CXR w/ bilateral ATX/effusion. Fever curve not increased, WBC downa little, pct negative, sputum is pending. Creatinine improving. Cont supportive care. Pt improving  Assessment/Plan: Acute Hypoxic Respiratory Failure: S/P Trach for vocal cord edema &failure to wean. Right >Left atelectasis/effusion Severe deconditioning and protein calorie malnutrition - cont ATC he has secretions that intermittent cause hypoxia that he clears well -pcxr with increasing effusion and pos 1 liter again, lasix start To chair, standing Chest pt, will counsel him to allow mucomysts x 2 more doses Int PMV until secretions lower Cuff down with strong cough okay  MRSE Bacteremia:  - Vancomycin, have d/w pharmacy, likely will provide  weeks - Repeat blood cultures performed 11/4neg  Acute Encephalopathy/Delirium- improving:  - Seroquel 50mg  bid, likley can reduce   NSTEMI: S/P CABG on 10/17.  Atrial Fibrillation w/ RVR:  -Chronic Diastolic Congestive Heart Failure: Grade 2 dysfunction on 10/24 echocardiogram - lasix restart - Continuing ASA 325mg , Lipitor 80mg , &Lopressor bid.   ARF resolving P:   Lasix start to even to neg 1 liter goal Chem in am  Assess na trend off free water  Anemia: - limit phlebotomy, no cbc needed in am   GI Diarrhea: C diff negative 10/28. No evidence of worsening Add immodium \\for  SLP  Diabetes Mellitus Type [id number] controlled.  - Continuing SSI per algorithm with accu-checks q4hr.  H/O Moderate COPD:No signs of exacerbation. - Continuing Xopenex nebs tid &Pulmicort neb bid.  H/O GERD: - Continuing Pepcid VT daily.  For SLP   Prophylaxis: coumadin 11/8  FAMILY - Updates: pt daily   Lavon Paganini. Titus Mould, MD, [place] Pgr: [facility] 11/18/2015 7:57 AM

## 2015-11-18 NOTE — Care Management Important Message (Signed)
Important Message  Patient Details  Name: Mark Rivers MRN: [id number] Date of Birth: [birth year]   Medicare Important Message Given:  Yes    Arshi Duarte Abena 11/18/2015, 9:30 AM

## 2015-11-18 NOTE — Progress Notes (Signed)
RT called to bedside for low 02 sat 85-88%. Pt's FiO2 increased to 60% on trach collar to maintain adequate oxygenation. RT and RN aware of change and will continue to monitor closely.

## 2015-11-18 NOTE — Procedures (Signed)
Objective Swallowing Evaluation: Type of Study: FEES-Fiberoptic Endoscopic Evaluation of Swallow  Patient Details  Name: Mark Rivers MRN: [id number] Date of Birth: [birth year]  Today's Date: 11/18/2015 Time: SLP Start Time (ACUTE ONLY): 1341-SLP Stop Time (ACUTE ONLY): 1358 SLP Time Calculation (min) (ACUTE ONLY): 17 min  Past Medical History:  Past Medical History:  Diagnosis Date  . Allergy   . Back pain   . Coronary artery disease   . Diabetes mellitus   . GERD (gastroesophageal reflux disease)   . Hypertension   . Neuropathy [facility])    Past Surgical History:  Past Surgical History:  Procedure Laterality Date  . CARDIAC CATHETERIZATION N/A 10/26/2015   Procedure: Left Heart Cath and Coronary Angiography;  Surgeon: Troy Sine, MD;  Location: [place] [facility];  Service: Cardiovascular;  Laterality: N/A;  . cataract surg     bil/2008  . COLONOSCOPY    . CORONARY ARTERY BYPASS GRAFT N/A 10/26/2015   Procedure: CORONARY ARTERY BYPASS GRAFTING (CABG) x3(LIMA to LA, SVG to OM1, SVG to PDA) with EVH from the left thigh and partial lower leg greater saphenous vein and left internal mammary artery;  Surgeon: Ivin Poot, MD;  Location: [place];  Service: Open Heart Surgery;  Laterality: N/A;  . CORONARY STENT PLACEMENT     1988  . TEE WITHOUT CARDIOVERSION N/A 10/26/2015   Procedure: TRANSESOPHAGEAL ECHOCARDIOGRAM (TEE);  Surgeon: Ivin Poot, MD;  Location: [place];  Service: Open Heart Surgery;  Laterality: N/A;  . TONSILLECTOMY     HPI: 81 y.o.male presented with increasing dyspnea and a fib with RVR. Found to have 99% Lt main CAD and had emergent CABG 10/17. He remained intubated 10/17-10/26 until trach placed. PMH includes CAD, HTN, GERD, DM  Subjective: pt alert, cooperative   Assessment / Plan / Recommendation  CHL IP CLINICAL IMPRESSIONS 11/18/2015  Therapy Diagnosis Moderate pharyngeal phase dysphagia  Clinical Impression Pt has a mdoerate pharyngeal dysphagia  due to generalized deconditioning and reduced sensation. His has diffuse erythema throughout his pharynx, and standing secretions are observed in his pharynx, larynx, and trachea. Cued cough appears strong, but is not successful at clearing secretions from his airway. He has a delay in swallow initiation with all consistencies provided that leads to silent penetration and intermittent aspiration. Penetration is trace and intermittent with pureed solids, but he has moderate, diffuse residue that remains after the swallow that can be reduced but not cleared with cued subswallows. Given pt's reduced sensation and incomplete clearance of secretions despite coughing, he would remain at a high risk for an aspiration related infection and therefore is not yet ready for a PO diet; however, he may benefit from few therapeutic trials of purees in therapy with SLP. Will continue to f/u for advancement when ready.  Impact on safety and function Moderate aspiration risk;Severe aspiration risk      CHL IP TREATMENT RECOMMENDATION 11/18/2015  Treatment Recommendations Therapy as outlined in treatment plan below     Prognosis 11/18/2015  Prognosis for Safe Diet Advancement Good  Barriers to Reach Goals --  Barriers/Prognosis Comment --    CHL IP DIET RECOMMENDATION 11/18/2015  SLP Diet Recommendations NPO;Alternative means - temporary  Liquid Administration via --  Medication Administration Via alternative means  Compensations --  Postural Changes --      CHL IP OTHER RECOMMENDATIONS 11/18/2015  Recommended Consults --  Oral Care Recommendations Oral care QID  Other Recommendations Have oral suction available      CHL  IP FOLLOW UP RECOMMENDATIONS 11/18/2015  Follow up Recommendations Skilled Nursing facility      [facility] IP FREQUENCY AND DURATION 11/18/2015  Speech Therapy Frequency (ACUTE ONLY) min 2x/week  Treatment Duration 2 weeks           CHL IP ORAL PHASE 11/18/2015  Oral Phase WFL  Oral -  Pudding Teaspoon --  Oral - Pudding Cup --  Oral - Honey Teaspoon --  Oral - Honey Cup --  Oral - Nectar Teaspoon --  Oral - Nectar Cup --  Oral - Nectar Straw --  Oral - Thin Teaspoon --  Oral - Thin Cup --  Oral - Thin Straw --  Oral - Puree --  Oral - Mech Soft --  Oral - Regular --  Oral - Multi-Consistency --  Oral - Pill --  Oral Phase - Comment --    CHL IP PHARYNGEAL PHASE 11/18/2015  Pharyngeal Phase Impaired  Pharyngeal- Pudding Teaspoon --  Pharyngeal --  Pharyngeal- Pudding Cup --  Pharyngeal --  Pharyngeal- Honey Teaspoon Delayed swallow initiation-pyriform sinuses;Reduced pharyngeal peristalsis;Reduced tongue base retraction;Penetration/Aspiration before swallow;Pharyngeal residue - valleculae;Pharyngeal residue - pyriform;Lateral channel residue  Pharyngeal Material enters airway, CONTACTS cords and not ejected out  Pharyngeal- Honey Cup --  Pharyngeal --  Pharyngeal- Nectar Teaspoon Delayed swallow initiation-pyriform sinuses;Reduced pharyngeal peristalsis;Reduced tongue base retraction;Penetration/Aspiration before swallow;Pharyngeal residue - valleculae;Pharyngeal residue - pyriform;Lateral channel residue  Pharyngeal Material enters airway, passes BELOW cords without attempt by patient to eject out (silent aspiration)  Pharyngeal- Nectar Cup --  Pharyngeal --  Pharyngeal- Nectar Straw --  Pharyngeal --  Pharyngeal- Thin Teaspoon Delayed swallow initiation-pyriform sinuses;Reduced pharyngeal peristalsis;Reduced tongue base retraction;Penetration/Aspiration before swallow;Pharyngeal residue - valleculae;Pharyngeal residue - pyriform;Lateral channel residue;Other (Comment)  Pharyngeal Material enters airway, CONTACTS cords and not ejected out  Pharyngeal- Thin Cup --  Pharyngeal --  Pharyngeal- Thin Straw --  Pharyngeal --  Pharyngeal- Puree Delayed swallow initiation-pyriform sinuses;Reduced pharyngeal peristalsis;Reduced tongue base  retraction;Penetration/Aspiration before swallow;Pharyngeal residue - valleculae;Pharyngeal residue - pyriform;Lateral channel residue  Pharyngeal Material enters airway, remains ABOVE vocal cords and not ejected out  Pharyngeal- Mechanical Soft --  Pharyngeal --  Pharyngeal- Regular --  Pharyngeal --  Pharyngeal- Multi-consistency --  Pharyngeal --  Pharyngeal- Pill --  Pharyngeal --  Pharyngeal Comment --     CHL IP CERVICAL ESOPHAGEAL PHASE 11/18/2015  Cervical Esophageal Phase WFL  Pudding Teaspoon --  Pudding Cup --  Honey Teaspoon --  Honey Cup --  Nectar Teaspoon --  Nectar Cup --  Nectar Straw --  Thin Teaspoon --  Thin Cup --  Thin Straw --  Puree --  Mechanical Soft --  Regular --  Multi-consistency --  Pill --  Cervical Esophageal Comment --    No flowsheet data found.  [name] 11/18/2015, 3:03 PM    [name], M.A. CCC-SLP [phone]

## 2015-11-18 NOTE — Progress Notes (Signed)
23 Days Post-Op Procedure(s) (LRB): CORONARY ARTERY BYPASS GRAFTING (CABG) x3(LIMA to LA, SVG to OM1, SVG to PDA) with EVH from the left thigh and partial lower leg greater saphenous vein and left internal mammary artery (N/A) TRANSESOPHAGEAL ECHOCARDIOGRAM (TEE) (N/A) INTRA-AORTIC BALLOON PUMP INSERTION size 40cc (Left) Subjective: Increased O2 demands on trach collar Acute on chronic pulmonary disease - COPD with afib, diastolic HF Needs to resume  LaSIX, free water dc'ed  Objective: Vital signs in last 24 hours: Temp:  [97.4 F (36.3 C)-98.7 F (37.1 C)] 98.6 F (37 C) (11/09 0400) Pulse Rate:  [71-102] 85 (11/09 0600) Cardiac Rhythm: Atrial fibrillation (11/09 0400) Resp:  [20-32] 27 (11/09 0600) BP: (129-160)/(47-86) 147/63 (11/09 0600) SpO2:  [92 %-99 %] 96 % (11/09 0600) FiO2 (%):  [40 %-60 %] 50 % (11/09 0332) Weight:  [207 lb 3.7 oz (94 kg)] 207 lb 3.7 oz (94 kg) (11/09 0500)  Hemodynamic parameters for last 24 hours:  afebrile  Intake/Output from previous day: 11/08 0701 - 11/09 0700 In: 3040 [NG/GT:2580; IV Piggyback:200] Out: 2025 [Urine:2025] Intake/Output this shift: No intake/output data recorded.      Physical Exam  General: elerly obese WM with peripheral edema HEENT: Normocephalic pupils equal , dentition adequate Neck: Supple without JVD, adenopathy, or bruit Chest:coarse breath sounds, + rhonchi, no tenderness             or deformity sternotomy healed Cardiovascular: afib rhythm, no murmur, no gallop, peripheral pulses             palpable in all extremities Abdomen:  Soft, nontender, no palpable mass or organomegaly Extremities: Warm, well-perfused, no clubbing cyanosis edema or tenderness,              no venous stasis changes of the legs Rectal/GU: Deferred Neuro: Grossly non--focal and symmetrical throughout Skin: Clean and dry without rash or ulceration   Lab Results:  Recent Labs  11/16/15 0440 11/18/15 0405  WBC 11.6* 12.3*  HGB  8.1* 8.3*  HCT 26.3* 27.5*  PLT 290 300   BMET:  Recent Labs  11/17/15 0410 11/18/15 0405  NA 142 141  K 3.9 4.0  CL 107 106  CO2 28 28  GLUCOSE 132* 169*  BUN 42* 41*  CREATININE 1.24 1.07  CALCIUM 8.3* 8.3*    PT/INR:  Recent Labs  11/18/15 0405  LABPROT 16.3*  INR 1.30   ABG    Component Value Date/Time   PHART 7.381 11/04/2015 0330   HCO3 30.7 (H) 11/04/2015 0330   TCO2 31 11/05/2015 1803   ACIDBASEDEF 1.0 10/29/2015 1531   O2SAT 91.3 11/04/2015 0330   CBG (last 3)   Recent Labs  11/17/15 1940 11/17/15 2351 11/18/15 0352  GLUCAP 136* 134* 145*    Assessment/Plan: S/P Procedure(s) (LRB): CORONARY ARTERY BYPASS GRAFTING (CABG) x3(LIMA to LA, SVG to OM1, SVG to PDA) with EVH from the left thigh and partial lower leg greater saphenous vein and left internal mammary artery (N/A) TRANSESOPHAGEAL ECHOCARDIOGRAM (TEE) (N/A) INTRA-AORTIC BALLOON PUMP INSERTION size 40cc (Left) Persistent acute on chronic pulmoary failure Add lasix daily and cont current care Coumadin started for chronic afib Check for DVT   LOS: 24 days    Tharon Aquas Trigt III 11/18/2015

## 2015-11-19 ENCOUNTER — Inpatient Hospital Stay (HOSPITAL_COMMUNITY): Payer: Medicare Other

## 2015-11-19 DIAGNOSIS — M79662 Pain in left lower leg: Secondary | ICD-10-CM

## 2015-11-19 DIAGNOSIS — M79661 Pain in right lower leg: Secondary | ICD-10-CM

## 2015-11-19 LAB — CBC
HCT: 27.6 % — ABNORMAL LOW (ref 39.0–52.0)
Hemoglobin: 8.3 g/dL — ABNORMAL LOW (ref 13.0–17.0)
MCH: 29.6 pg (ref 26.0–34.0)
MCHC: 30.1 g/dL (ref 30.0–36.0)
MCV: 98.6 fL (ref 78.0–100.0)
Platelets: 351 10*3/uL (ref 150–400)
RBC: 2.8 MIL/uL — ABNORMAL LOW (ref 4.22–5.81)
RDW: 16.5 % — ABNORMAL HIGH (ref 11.5–15.5)
WBC: 14.2 10*3/uL — ABNORMAL HIGH (ref 4.0–10.5)

## 2015-11-19 LAB — DIGOXIN LEVEL: Digoxin Level: 0.8 ng/mL (ref 0.8–2.0)

## 2015-11-19 LAB — GLUCOSE, CAPILLARY
Glucose-Capillary: 122 mg/dL — ABNORMAL HIGH (ref 65–99)
Glucose-Capillary: 140 mg/dL — ABNORMAL HIGH (ref 65–99)
Glucose-Capillary: 119 mg/dL — ABNORMAL HIGH (ref 65–99)
Glucose-Capillary: 111 mg/dL — ABNORMAL HIGH (ref 65–99)
Glucose-Capillary: 138 mg/dL — ABNORMAL HIGH (ref 65–99)
Glucose-Capillary: 141 mg/dL — ABNORMAL HIGH (ref 65–99)

## 2015-11-19 LAB — BASIC METABOLIC PANEL
Anion gap: 7 (ref 5–15)
BUN: 40 mg/dL — ABNORMAL HIGH (ref 6–20)
CO2: 28 mmol/L (ref 22–32)
Calcium: 8.5 mg/dL — ABNORMAL LOW (ref 8.9–10.3)
Chloride: 105 mmol/L (ref 101–111)
Creatinine, Ser: 0.98 mg/dL (ref 0.61–1.24)
GFR calc Af Amer: >60 mL/min (ref >60)
GFR calc non Af Amer: >60 mL/min (ref >60)
Glucose, Bld: 144 mg/dL — ABNORMAL HIGH (ref 65–99)
Potassium: 3.9 mmol/L (ref 3.5–5.1)
Sodium: 140 mmol/L (ref 135–145)

## 2015-11-19 LAB — VANCOMYCIN, TROUGH: Vancomycin Tr: 10 ug/mL — ABNORMAL LOW (ref 15–20)

## 2015-11-19 LAB — PROTIME-INR
INR: 1.2
Prothrombin Time: 15.2 seconds (ref 11.4–15.2)

## 2015-11-19 LAB — MAGNESIUM: Magnesium: 2.2 mg/dL (ref 1.7–2.4)

## 2015-11-19 MED ORDER — POTASSIUM CHLORIDE 20 MEQ/15ML (10%) PO SOLN
20.0000 meq | Freq: Every day | ORAL | Status: DC
  Administered 2015-11-19 – 2015-11-26 (×8): 20 meq via ORAL
  Filled 2015-11-19 (×9): qty 15

## 2015-11-19 MED ORDER — WARFARIN SODIUM 7.5 MG PO TABS
7.5000 mg | ORAL_TABLET | Freq: Every day | ORAL | Status: DC
  Administered 2015-11-19 – 2015-11-21 (×3): 7.5 mg via ORAL
  Filled 2015-11-19 (×3): qty 1

## 2015-11-19 NOTE — Progress Notes (Signed)
Pharmacy called regarding Vancomycin dose s/p low vanc trough result. Michela Pitcher would check and call back.  Henreitta Leber, RN 8:42 PM 11/19/15

## 2015-11-19 NOTE — Progress Notes (Signed)
Pt has refused CPT. RN notified.

## 2015-11-19 NOTE — Progress Notes (Signed)
24 Days Post-Op Procedure(s) (LRB): CORONARY ARTERY BYPASS GRAFTING (CABG) x3(LIMA to LA, SVG to OM1, SVG to PDA) with EVH from the left thigh and partial lower leg greater saphenous vein and left internal mammary artery (N/A) TRANSESOPHAGEAL ECHOCARDIOGRAM (TEE) (N/A) INTRA-AORTIC BALLOON PUMP INSERTION size 40cc (Left) Subjective: Respiratory status slowly better- of veny > 2 days Rate controlled afib on coumadin starting dosing Able to talk Still too weak to stand - needs PT for obesity and deconditioning Failed FEES on TF Objective: Vital signs in last 24 hours: Temp:  [97.8 F (36.6 C)-98.3 F (36.8 C)] 97.9 F (36.6 C) (11/10 0400) Pulse Rate:  [69-98] 87 (11/10 0746) Cardiac Rhythm: Atrial fibrillation (11/09 2000) Resp:  [20-32] 23 (11/10 0746) BP: (96-170)/(56-95) 96/70 (11/10 0746) SpO2:  [88 %-99 %] 96 % (11/10 0746) FiO2 (%):  [35 %-40 %] 35 % (11/10 0746) Weight:  [208 lb 8.9 oz (94.6 kg)] 208 lb 8.9 oz (94.6 kg) (11/10 0500)  Hemodynamic parameters for last 24 hours:  afib  Intake/Output from previous day: 11/09 0701 - 11/10 0700 In: 1560 [NG/GT:1070; IV Piggyback:200] Out: 2710 [Urine:2710] Intake/Output this shift: No intake/output data recorded.  EXAM Coarse breath sounds abd soft Mild edema Lab Results:  Recent Labs  11/18/15 0405 11/19/15 0428  WBC 12.3* 14.2*  HGB 8.3* 8.3*  HCT 27.5* 27.6*  PLT 300 351   BMET:  Recent Labs  11/18/15 0405 11/19/15 0428  NA 141 140  K 4.0 3.9  CL 106 105  CO2 28 28  GLUCOSE 169* 144*  BUN 41* 40*  CREATININE 1.07 0.98  CALCIUM 8.3* 8.5*    PT/INR:  Recent Labs  11/19/15 0428  LABPROT 15.2  INR 1.20   ABG    Component Value Date/Time   PHART 7.381 11/04/2015 0330   HCO3 30.7 (H) 11/04/2015 0330   TCO2 31 11/05/2015 1803   ACIDBASEDEF 1.0 10/29/2015 1531   O2SAT 91.3 11/04/2015 0330   CBG (last 3)   Recent Labs  11/18/15 1931 11/18/15 2344 11/19/15 0400  GLUCAP 140* 157* 122*     Assessment/Plan: S/P Procedure(s) (LRB): CORONARY ARTERY BYPASS GRAFTING (CABG) x3(LIMA to LA, SVG to OM1, SVG to PDA) with EVH from the left thigh and partial lower leg greater saphenous vein and left internal mammary artery (N/A) TRANSESOPHAGEAL ECHOCARDIOGRAM (TEE) (N/A) INTRA-AORTIC BALLOON PUMP INSERTION size 40cc (Left) Cont daily lasix for chronic diastolic HF   LOS: 25 days    Tharon Aquas Trigt III 11/19/2015

## 2015-11-19 NOTE — Progress Notes (Signed)
*  PRELIMINARY RESULTS* Vascular Ultrasound Bilateral lower extremity venous duplex has been completed.  Preliminary findings: No evidence of deep vein thrombosis or baker's cysts bilaterally.   Everrett Coombe 11/19/2015, 12:36 PM

## 2015-11-19 NOTE — Plan of Care (Signed)
Problem: Activity: Goal: Risk for activity intolerance will decrease Outcome: Progressing Pt is progressing but slowly. He is now able to get out of bed into chair via lift and is working with physical therapy.

## 2015-11-19 NOTE — Plan of Care (Signed)
Problem: Respiratory: Goal: Respiratory status will improve Outcome: Progressing Pt has been on trach collar for multiple days without any ventilator support

## 2015-11-19 NOTE — Progress Notes (Signed)
      [address]       [place] [address]             [phone]      Evening rounds  Alert, comfortable  BP (!) 167/67   Pulse 72   Temp 97.7 F (36.5 C) (Oral)   Resp (!) 25   Ht 5\' 6"  (1.676 m)   Wt 208 lb 8.9 oz (94.6 kg)   SpO2 94%   BMI 33.66 kg/m    Intake/Output Summary (Last 24 hours) at 11/19/15 1726 Last data filed at 11/19/15 1300  Gross per 24 hour  Intake             1160 ml  Output             2485 ml  Net            -1325 ml   Duplex= No DVT  Remo Lipps C. Roxan Hockey, MD [facility] [phone]

## 2015-11-19 NOTE — Progress Notes (Addendum)
Titrated FIO2 to 35%, 10L on ATC. Sat 94%. Pt has refused CPT at this time. RN notified. RT will continue to monitor.

## 2015-11-20 LAB — GLUCOSE, CAPILLARY
Glucose-Capillary: 129 mg/dL — ABNORMAL HIGH (ref 65–99)
Glucose-Capillary: 157 mg/dL — ABNORMAL HIGH (ref 65–99)
Glucose-Capillary: 141 mg/dL — ABNORMAL HIGH (ref 65–99)
Glucose-Capillary: 126 mg/dL — ABNORMAL HIGH (ref 65–99)
Glucose-Capillary: 122 mg/dL — ABNORMAL HIGH (ref 65–99)
Glucose-Capillary: 142 mg/dL — ABNORMAL HIGH (ref 65–99)

## 2015-11-20 LAB — CBC
HCT: 28.6 % — ABNORMAL LOW (ref 39.0–52.0)
Hemoglobin: 8.5 g/dL — ABNORMAL LOW (ref 13.0–17.0)
MCH: 29.3 pg (ref 26.0–34.0)
MCHC: 29.7 g/dL — ABNORMAL LOW (ref 30.0–36.0)
MCV: 98.6 fL (ref 78.0–100.0)
Platelets: 363 10*3/uL (ref 150–400)
RBC: 2.9 MIL/uL — ABNORMAL LOW (ref 4.22–5.81)
RDW: 16.6 % — ABNORMAL HIGH (ref 11.5–15.5)
WBC: 12.5 10*3/uL — ABNORMAL HIGH (ref 4.0–10.5)

## 2015-11-20 LAB — BASIC METABOLIC PANEL
Anion gap: 7 (ref 5–15)
BUN: 39 mg/dL — ABNORMAL HIGH (ref 6–20)
CO2: 30 mmol/L (ref 22–32)
Calcium: 8.5 mg/dL — ABNORMAL LOW (ref 8.9–10.3)
Chloride: 104 mmol/L (ref 101–111)
Creatinine, Ser: 0.98 mg/dL (ref 0.61–1.24)
GFR calc Af Amer: >60 mL/min (ref >60)
GFR calc non Af Amer: >60 mL/min (ref >60)
Glucose, Bld: 146 mg/dL — ABNORMAL HIGH (ref 65–99)
Potassium: 3.9 mmol/L (ref 3.5–5.1)
Sodium: 141 mmol/L (ref 135–145)

## 2015-11-20 LAB — PROTIME-INR
INR: 1.5
Prothrombin Time: 18.3 seconds — ABNORMAL HIGH (ref 11.4–15.2)

## 2015-11-20 LAB — MAGNESIUM: MAGNESIUM: 2 mg/dL (ref 1.7–2.4)

## 2015-11-20 MED ORDER — VANCOMYCIN HCL 10 G IV SOLR
1500.0000 mg | INTRAVENOUS | Status: DC
  Filled 2015-11-20: qty 1500

## 2015-11-20 MED ORDER — VANCOMYCIN HCL 10 G IV SOLR
1500.0000 mg | INTRAVENOUS | Status: DC
  Administered 2015-11-20 – 2015-11-22 (×3): 1500 mg via INTRAVENOUS
  Filled 2015-11-20 (×4): qty 1500

## 2015-11-20 MED ORDER — DIPHENHYDRAMINE HCL 12.5 MG/5ML PO ELIX
25.0000 mg | ORAL_SOLUTION | Freq: Every evening | ORAL | Status: DC | PRN
  Administered 2015-11-20 – 2015-11-22 (×3): 25 mg
  Filled 2015-11-20 (×7): qty 10

## 2015-11-20 MED ORDER — METOPROLOL TARTRATE 25 MG/10 ML ORAL SUSPENSION
25.0000 mg | Freq: Two times a day (BID) | ORAL | Status: DC
  Administered 2015-11-20 – 2015-11-26 (×12): 25 mg
  Filled 2015-11-20 (×13): qty 10

## 2015-11-20 MED ORDER — METOPROLOL TARTRATE 25 MG PO TABS
25.0000 mg | ORAL_TABLET | Freq: Two times a day (BID) | ORAL | Status: DC
  Filled 2015-11-20 (×2): qty 1

## 2015-11-20 MED ORDER — ALPRAZOLAM 0.25 MG PO TABS
0.2500 mg | ORAL_TABLET | Freq: Once | ORAL | Status: AC
  Administered 2015-11-20: 0.25 mg via ORAL
  Filled 2015-11-20: qty 1

## 2015-11-20 NOTE — Progress Notes (Signed)
25 Days Post-Op Procedure(s) (LRB): CORONARY ARTERY BYPASS GRAFTING (CABG) x3(LIMA to LA, SVG to OM1, SVG to PDA) with EVH from the left thigh and partial lower leg greater saphenous vein and left internal mammary artery (N/A) TRANSESOPHAGEAL ECHOCARDIOGRAM (TEE) (N/A) INTRA-AORTIC BALLOON PUMP INSERTION size 40cc (Left) Subjective: C/o unable to sleep last night Denies pain Worried about BP  Objective: Vital signs in last 24 hours: Temp:  [97.6 F (36.4 C)-98.5 F (36.9 C)] 98.5 F (36.9 C) (11/11 0724) Pulse Rate:  [68-99] 98 (11/11 0700) Cardiac Rhythm: Atrial fibrillation (11/11 0400) Resp:  [19-31] 19 (11/11 0700) BP: (132-185)/(55-85) 138/60 (11/11 0400) SpO2:  [87 %-99 %] 92 % (11/11 0700) FiO2 (%):  [35 %] 35 % (11/11 0400) Weight:  [203 lb 14.8 oz (92.5 kg)] 203 lb 14.8 oz (92.5 kg) (11/11 0500)  Hemodynamic parameters for last 24 hours:    Intake/Output from previous day: 11/10 0701 - 11/11 0700 In: 1650 [I.V.:10; NG/GT:1440; IV Piggyback:200] Out: 2695 [Urine:2695] Intake/Output this shift: No intake/output data recorded.  General appearance: alert, cooperative and no distress Neurologic: generally weak Heart: regular rate and rhythm Lungs: congested bilaterally Abdomen: normal findings: soft, non-tender  Lab Results:  Recent Labs  11/19/15 0428 11/20/15 0325  WBC 14.2* 12.5*  HGB 8.3* 8.5*  HCT 27.6* 28.6*  PLT 351 363   BMET:  Recent Labs  11/19/15 0428 11/20/15 0325  NA 140 141  K 3.9 3.9  CL 105 104  CO2 28 30  GLUCOSE 144* 146*  BUN 40* 39*  CREATININE 0.98 0.98  CALCIUM 8.5* 8.5*    PT/INR:  Recent Labs  11/20/15 0325  LABPROT 18.3*  INR 1.50   ABG    Component Value Date/Time   PHART 7.381 11/04/2015 0330   HCO3 30.7 (H) 11/04/2015 0330   TCO2 31 11/05/2015 1803   ACIDBASEDEF 1.0 10/29/2015 1531   O2SAT 91.3 11/04/2015 0330   CBG (last 3)   Recent Labs  11/19/15 2317 11/20/15 0320 11/20/15 0717  GLUCAP 141* 129*  157*    Assessment/Plan: S/P Procedure(s) (LRB): CORONARY ARTERY BYPASS GRAFTING (CABG) x3(LIMA to LA, SVG to OM1, SVG to PDA) with EVH from the left thigh and partial lower leg greater saphenous vein and left internal mammary artery (N/A) TRANSESOPHAGEAL ECHOCARDIOGRAM (TEE) (N/A) INTRA-AORTIC BALLOON PUMP INSERTION size 40cc (Left) -CV- in SR.   Hypertension- will increase lopressor to 25 mg BID  On warfarin 7.5 mg daily, INR still subtherapeutic but up a little today  RESP_ respiratory failure. On TC x 3 days  Secretions remain an issue  ID- afebrile on vancomycin  RENAL- creatinine and lytes OK  ENDO - CBG adequately controlled  Deconditioning- severe   LOS: 26 days    Mark Rivers 11/20/2015

## 2015-11-20 NOTE — Progress Notes (Signed)
eLink Physician-Brief Progress Note Patient Name: Mark Rivers DOB: 1934-04-25 MRN: [id number]   Date of Service  11/20/2015  HPI/Events of Note  Anxiety.  eICU Interventions  Will order: 1. Xanax 0.25 mg PO X 1 now.      Intervention Category Minor Interventions: Agitation / anxiety - evaluation and management  Sommer,Steven Eugene 11/20/2015, 3:04 AM

## 2015-11-20 NOTE — Progress Notes (Signed)
Called [facility] d/t pt restlessness and anxiety, inability to sleep. Pt has been using call bell incessantly all night d/t inability to sleep. PRN fentanyl given at 01:15, but not effective. Since no other PRNs available, discussed possibility of a one time PRN medication with [facility]. She will talk with Dr. Oletta Darter to see if he sees fit to order something. Will continue to monitor.   Henreitta Leber, RN 2:51 AM 11/20/15

## 2015-11-20 NOTE — Progress Notes (Signed)
      [address]       [place] [address]             [phone]      Stable day  No new issues  Edker Punt C. Ahnesti Townsend, MD [facility] [phone]  

## 2015-11-20 NOTE — Progress Notes (Signed)
Pharmacy returned call re: Vanc dosing. Said too soon since vancomycin got restarted for trough result to be valuable. Said to maintain the 1000mg /200cc dose.  Henreitta Leber, RN 4:16 AM 11/20/15

## 2015-11-20 NOTE — Progress Notes (Signed)
One time PRN xanax given for pt's anxiety. Not effective, do not recommend for later use.

## 2015-11-20 NOTE — Progress Notes (Signed)
Pharmacy Antibiotic Note  Mark Rivers is a 81 y.o. male continues on vancomycin day #14 for pneumonia and MRSE bacteremia.  Vancomycin trough level obtained last night was 10 drawn about an hour before dose due. Given the severity of disease with bacteremia and pneumonia would like to achieve higher trough levels with treatment.   Recalculating patient kinetics, 1500mg  q24 hours should give a trough closer to 15. Scr appears normal.  Vancomycin trough goal 15-20  Plan: 1) Vancomycin 1500mg  IV q 24 hrs  2) Monitor renal function and will adjust dose accordingly. 3) recheck trough in a few days if therapy is continued   Height: 5\' 6"  (167.6 cm) Weight: 203 lb 14.8 oz (92.5 kg) IBW/kg (Calculated) : 63.8  Temp (24hrs), Avg:98.2 F (36.8 C), Min:97.6 F (36.4 C), Max:98.9 F (37.2 C)   Recent Labs Lab 11/15/15 0400 11/16/15 0440 11/17/15 0410 11/17/15 1745 11/18/15 0405 11/19/15 0428 11/19/15 1800 11/20/15 0325  WBC 11.2* 11.6*  --   --  12.3* 14.2*  --  12.5*  CREATININE 1.67* 1.27* 1.24  --  1.07 0.98  --  0.98  VANCOTROUGH  --   --   --  9*  --   --  10*  --   VANCORANDOM 19  --   --   --   --   --   --   --     Estimated Creatinine Clearance: 65.1 mL/min (by C-G formula based on SCr of 0.98 mg/dL).    Allergies  Allergen Reactions  . Amoxicillin Other (See Comments)    PATIENT PASSES OUT!!!!  . Cephalexin Other (See Comments)    PATIENT PASSES OUT!!!!  . Penicillin G Other (See Comments)    Has patient had a PCN reaction causing immediate rash, facial/tongue/throat swelling, SOB or lightheadedness with hypotension: Yes Has patient had a PCN reaction causing severe rash involving mucus membranes or skin necrosis: No Has patient had a PCN reaction that required hospitalization: Yes Has patient had a PCN reaction occurring within the last 10 years: Yes If all of the above answers are "NO", then may proceed with Cephalosporin use. PATIENT PASSES OUT!!!!    Antimicrobials this admission:  Levofloxacin 10/17>>10/23 Vancomycin 10/17>>10/18 Aztreonam 10/24>>10/30 Vancomycin 10/25>>  Dose adjustments this admission:  10/31 VT = 12 on 1250 q24 >> 750mg  q12  11/2 VT = 14 mcg/mL 750mg  q12 (checked prior to 3rd dose) >> 1gm q12 115 VT = 34 mcg/mL 1gm q12 >> hold 11/6 VR = 19 11/8 VT = 9 > 1g q 24 hrs 11/10 VT 10>>1500mg  q24 hours  Microbiology results:  10/18 MRSA PCR - negative 10/18, 10/19, 10/24, 10/30 TA - negative 10/18 Blood x 2: neg 10/24 BCx: CoNS 1 of 2 (BCID MRSE, Vanc MIC = 1) 10/26 BCx: CoNS 1 of 2 (BCID MRSE) 10/27 R pleural fluid - negative 10/28 Cdiff: negative 10/31 BCx x2: NGF 11/4 urine:ngF 11/4 blood x 2: ngF  Erin Hearing PharmD., BCPS Clinical Pharmacist Pager [phone] 11/20/2015 12:54 PM

## 2015-11-21 ENCOUNTER — Inpatient Hospital Stay (HOSPITAL_COMMUNITY): Payer: Medicare Other

## 2015-11-21 LAB — BASIC METABOLIC PANEL
Anion gap: 7 (ref 5–15)
BUN: 39 mg/dL — ABNORMAL HIGH (ref 6–20)
CO2: 30 mmol/L (ref 22–32)
Calcium: 8.4 mg/dL — ABNORMAL LOW (ref 8.9–10.3)
Chloride: 104 mmol/L (ref 101–111)
Creatinine, Ser: 1.04 mg/dL (ref 0.61–1.24)
GFR calc Af Amer: >60 mL/min (ref >60)
GFR calc non Af Amer: >60 mL/min (ref >60)
Glucose, Bld: 125 mg/dL — ABNORMAL HIGH (ref 65–99)
Potassium: 4.2 mmol/L (ref 3.5–5.1)
Sodium: 141 mmol/L (ref 135–145)

## 2015-11-21 LAB — CBC
HCT: 29.4 % — ABNORMAL LOW (ref 39.0–52.0)
Hemoglobin: 9 g/dL — ABNORMAL LOW (ref 13.0–17.0)
MCH: 30.2 pg (ref 26.0–34.0)
MCHC: 30.6 g/dL (ref 30.0–36.0)
MCV: 98.7 fL (ref 78.0–100.0)
Platelets: 312 10*3/uL (ref 150–400)
RBC: 2.98 MIL/uL — ABNORMAL LOW (ref 4.22–5.81)
RDW: 16.8 % — ABNORMAL HIGH (ref 11.5–15.5)
WBC: 9.4 10*3/uL (ref 4.0–10.5)

## 2015-11-21 LAB — GLUCOSE, CAPILLARY
Glucose-Capillary: 114 mg/dL — ABNORMAL HIGH (ref 65–99)
Glucose-Capillary: 129 mg/dL — ABNORMAL HIGH (ref 65–99)
Glucose-Capillary: 131 mg/dL — ABNORMAL HIGH (ref 65–99)
Glucose-Capillary: 135 mg/dL — ABNORMAL HIGH (ref 65–99)
Glucose-Capillary: 138 mg/dL — ABNORMAL HIGH (ref 65–99)
Glucose-Capillary: 149 mg/dL — ABNORMAL HIGH (ref 65–99)

## 2015-11-21 LAB — PROTIME-INR
INR: 1.72
Prothrombin Time: 20.4 seconds — ABNORMAL HIGH (ref 11.4–15.2)

## 2015-11-21 LAB — MAGNESIUM: Magnesium: 2 mg/dL (ref 1.7–2.4)

## 2015-11-21 NOTE — Progress Notes (Signed)
26 Days Post-Op Procedure(s) (LRB): CORONARY ARTERY BYPASS GRAFTING (CABG) x3(LIMA to LA, SVG to OM1, SVG to PDA) with EVH from the left thigh and partial lower leg greater saphenous vein and left internal mammary artery (N/A) TRANSESOPHAGEAL ECHOCARDIOGRAM (TEE) (N/A) INTRA-AORTIC BALLOON PUMP INSERTION size 40cc (Left) Subjective: Feels "terrible"- could sleep due to noise  Objective: Vital signs in last 24 hours: Temp:  [98.1 F (36.7 C)-99.3 F (37.4 C)] 98.8 F (37.1 C) (11/12 0738) Pulse Rate:  [70-101] 88 (11/12 0700) Cardiac Rhythm: Atrial fibrillation (11/11 2000) Resp:  [16-34] 30 (11/12 0700) BP: (99-170)/(53-78) 161/72 (11/12 0700) SpO2:  [93 %-100 %] 93 % (11/12 0700) FiO2 (%):  [35 %] 35 % (11/12 0600) Weight:  [204 lb 2.3 oz (92.6 kg)] 204 lb 2.3 oz (92.6 kg) (11/12 0244)  Hemodynamic parameters for last 24 hours:    Intake/Output from previous day: 11/11 0701 - 11/12 0700 In: 2010 [NG/GT:1290; IV Piggyback:500] Out: 3340 [Urine:3140; Stool:200] Intake/Output this shift: Total I/O In: 40 [NG/GT:40] Out: 125 [Urine:125]  General appearance: alert, cooperative and no distress Neurologic: generalized weakness Heart: regular rate and rhythm Lungs: coarse BS bilaterally Abdomen: soft, nontender  Lab Results:  Recent Labs  11/20/15 0325 11/21/15 0315  WBC 12.5* 9.4  HGB 8.5* 9.0*  HCT 28.6* 29.4*  PLT 363 312   BMET:  Recent Labs  11/20/15 0325 11/21/15 0315  NA 141 141  K 3.9 4.2  CL 104 104  CO2 30 30  GLUCOSE 146* 125*  BUN 39* 39*  CREATININE 0.98 1.04  CALCIUM 8.5* 8.4*    PT/INR:  Recent Labs  11/21/15 0315  LABPROT 20.4*  INR 1.72   ABG    Component Value Date/Time   PHART 7.381 11/04/2015 0330   HCO3 30.7 (H) 11/04/2015 0330   TCO2 31 11/05/2015 1803   ACIDBASEDEF 1.0 10/29/2015 1531   O2SAT 91.3 11/04/2015 0330   CBG (last 3)   Recent Labs  11/20/15 2253 11/21/15 0322 11/21/15 0733  GLUCAP 142* 129* 138*     Assessment/Plan: S/P Procedure(s) (LRB): CORONARY ARTERY BYPASS GRAFTING (CABG) x3(LIMA to LA, SVG to OM1, SVG to PDA) with EVH from the left thigh and partial lower leg greater saphenous vein and left internal mammary artery (N/A) TRANSESOPHAGEAL ECHOCARDIOGRAM (TEE) (N/A) INTRA-AORTIC BALLOON PUMP INSERTION size 40cc (Left) -  CV- in SR  Still hypertensive- lopressor increased yesterday  INR up to 1.72- continue 7.5 mg coumadin  RESP- remains on TC  ID afebrile on vancomycin  ENDO- CBG well controlled  Nutrition- on goal TF  Severe deconditioning, mobilize as tolerated, PT    LOS: 27 days    Melrose Nakayama 11/21/2015

## 2015-11-21 NOTE — Progress Notes (Signed)
Cortrak noted to be longer than initial shift assessment. Tube feeds stopped  Tracheal suctioning does not indicate aspiration of tube feeds, patient is not in respiratory distress and saturations remain low to mid 90s.   Placement length not noted in flowsheet.  Cortrak stabilized, xray ordered.  Xray noted placement in stomach.  Will continue to hold tube feeds and use cortrak for medications.  Will continue to monitor and update MD in morning.

## 2015-11-21 NOTE — Progress Notes (Signed)
      [address]       [place] [address]             [phone]      Anxious earlier per RN, now asleep  BP (!) 149/62   Pulse 80   Temp 97.8 F (36.6 C) (Axillary)   Resp (!) 34   Ht 5\' 6"  (1.676 m)   Wt 204 lb 2.3 oz (92.6 kg)   SpO2 92%   BMI 32.95 kg/m    Intake/Output Summary (Last 24 hours) at 11/21/15 1800 Last data filed at 11/21/15 1700  Gross per 24 hour  Intake             2120 ml  Output             2705 ml  Net             -585 ml    Stable day, no new issues  Remo Lipps C. Roxan Hockey, MD [facility] [phone]

## 2015-11-22 DIAGNOSIS — F05 Delirium due to known physiological condition: Secondary | ICD-10-CM

## 2015-11-22 DIAGNOSIS — E8770 Fluid overload, unspecified: Secondary | ICD-10-CM

## 2015-11-22 LAB — BASIC METABOLIC PANEL
Anion gap: 6 (ref 5–15)
BUN: 38 mg/dL — ABNORMAL HIGH (ref 6–20)
CO2: 30 mmol/L (ref 22–32)
Calcium: 8.5 mg/dL — ABNORMAL LOW (ref 8.9–10.3)
Chloride: 106 mmol/L (ref 101–111)
Creatinine, Ser: 1.01 mg/dL (ref 0.61–1.24)
GFR calc Af Amer: >60 mL/min (ref >60)
GFR calc non Af Amer: >60 mL/min (ref >60)
Glucose, Bld: 112 mg/dL — ABNORMAL HIGH (ref 65–99)
Potassium: 4 mmol/L (ref 3.5–5.1)
Sodium: 142 mmol/L (ref 135–145)

## 2015-11-22 LAB — CBC
HCT: 28.4 % — ABNORMAL LOW (ref 39.0–52.0)
Hemoglobin: 8.6 g/dL — ABNORMAL LOW (ref 13.0–17.0)
MCH: 29.9 pg (ref 26.0–34.0)
MCHC: 30.3 g/dL (ref 30.0–36.0)
MCV: 98.6 fL (ref 78.0–100.0)
Platelets: 372 10*3/uL (ref 150–400)
RBC: 2.88 MIL/uL — ABNORMAL LOW (ref 4.22–5.81)
RDW: 16.8 % — ABNORMAL HIGH (ref 11.5–15.5)
WBC: 10.3 10*3/uL (ref 4.0–10.5)

## 2015-11-22 LAB — GLUCOSE, CAPILLARY
Glucose-Capillary: 109 mg/dL — ABNORMAL HIGH (ref 65–99)
Glucose-Capillary: 110 mg/dL — ABNORMAL HIGH (ref 65–99)
Glucose-Capillary: 113 mg/dL — ABNORMAL HIGH (ref 65–99)
Glucose-Capillary: 139 mg/dL — ABNORMAL HIGH (ref 65–99)
Glucose-Capillary: 156 mg/dL — ABNORMAL HIGH (ref 65–99)
Glucose-Capillary: 183 mg/dL — ABNORMAL HIGH (ref 65–99)

## 2015-11-22 LAB — MAGNESIUM: Magnesium: 2 mg/dL (ref 1.7–2.4)

## 2015-11-22 LAB — PROTIME-INR
INR: 1.94
Prothrombin Time: 22.5 seconds — ABNORMAL HIGH (ref 11.4–15.2)

## 2015-11-22 MED ORDER — FUROSEMIDE 40 MG PO TABS
40.0000 mg | ORAL_TABLET | Freq: Every day | ORAL | Status: DC
  Administered 2015-11-22 – 2015-11-26 (×5): 40 mg via ORAL
  Filled 2015-11-22 (×6): qty 1

## 2015-11-22 MED ORDER — WARFARIN SODIUM 5 MG PO TABS
5.0000 mg | ORAL_TABLET | Freq: Every day | ORAL | Status: DC
  Administered 2015-11-22: 5 mg via ORAL
  Filled 2015-11-22: qty 1

## 2015-11-22 MED ORDER — PRO-STAT SUGAR FREE PO LIQD
30.0000 mL | Freq: Two times a day (BID) | ORAL | Status: DC
  Administered 2015-11-22 – 2015-11-26 (×7): 30 mL
  Filled 2015-11-22 (×8): qty 30

## 2015-11-22 MED ORDER — QUETIAPINE FUMARATE 50 MG PO TABS
50.0000 mg | ORAL_TABLET | Freq: Two times a day (BID) | ORAL | Status: DC
  Administered 2015-11-22 – 2015-11-26 (×8): 50 mg via ORAL
  Filled 2015-11-22 (×3): qty 1
  Filled 2015-11-22: qty 2
  Filled 2015-11-22: qty 1
  Filled 2015-11-22 (×2): qty 2
  Filled 2015-11-22 (×2): qty 1

## 2015-11-22 MED ORDER — JEVITY 1.2 CAL PO LIQD
1000.0000 mL | ORAL | Status: DC
  Administered 2015-11-23 – 2015-11-26 (×4): 1000 mL
  Filled 2015-11-22 (×11): qty 1000

## 2015-11-22 NOTE — Progress Notes (Signed)
PULMONARY / CRITICAL CARE MEDICINE   Name: Salim Alonge MRN: [id number] DOB: [birth year]    ADMISSION DATE:  10/25/2015 CONSULTATION DATE:10/27/15  Referring provider: Dr. Prescott Gum  [id number] of breath  Brief history 81 y.o.male presented with increasing dyspnea and a fib with RVR. Found to have 99% Lt main CAD and had emergent CABG 10/17.  SUBJECTIVE:  Up in chair, on trach collar. C/O being tired in the chair and wants to go back to bed. 35% FiO2.  Cortrak now in his stomach  VITAL SIGNS: BP (!) 156/69   Pulse 76   Temp 98.7 F (37.1 C) (Oral)   Resp 20   Ht 5\' 6"  (1.676 m)   Wt 93.4 kg (205 lb 14.6 oz)   SpO2 92%   BMI 33.23 kg/m   HEMODYNAMICS:    VENTILATOR SETTINGS: FiO2 (%):  [35 %] 35 %  INTAKE / OUTPUT: I/O last 3 completed shifts: In: [id number] [I.V.:10; Other:220; NG/GT:2240; IV Piggyback:1000] Out: [id number] [Urine:4365; Stool:200]  PHYSICAL EXAMINATION: General:  NAD, in chair Neuro:  Awake, alert, responds appropriately HEENT:  [place], [place]/AT Cardiovascular:  s1 s2 RRR Lungs: Coarse BS that clears with coughing. Abdomen:  Soft, nontender, nondistended, bowel sounds present Musculoskeletal:  Moves all 4 extremities Skin:  Warm, dry, intact, excoriations unchanged  LABS:  BMET  Recent Labs Lab 11/20/15 0325 11/21/15 0315 11/22/15 0330  NA 141 141 142  K 3.9 4.2 4.0  CL 104 104 106  CO2 30 30 30   BUN 39* 39* 38*  CREATININE 0.98 1.04 1.01  GLUCOSE 146* 125* 112*    Electrolytes  Recent Labs Lab 11/17/15 0410  11/20/15 0325 11/21/15 0315 11/22/15 0330  CALCIUM 8.3*  < > 8.5* 8.4* 8.5*  MG 2.2  < > 2.0 2.0 2.0  PHOS 3.2  --   --   --   --   < > = values in this interval not displayed.  CBC  Recent Labs Lab 11/20/15 0325 11/21/15 0315 11/22/15 0330  WBC 12.5* 9.4 10.3  HGB 8.5* 9.0* 8.6*  HCT 28.6* 29.4* 28.4*  PLT 363 312 372   Coag's  Recent Labs Lab 11/20/15 0325 11/21/15 0315 11/22/15 0330  INR 1.50 1.72  1.94   Sepsis Markers No results for input(s): LATICACIDVEN, PROCALCITON, O2SATVEN in the last 168 hours.  ABG No results for input(s): PHART, PCO2ART, PO2ART in the last 168 hours.  Liver Enzymes  Recent Labs Lab 11/18/15 0405  AST 55*  ALT 62  ALKPHOS 76  BILITOT 0.7  ALBUMIN 1.9*   Cardiac Enzymes No results for input(s): TROPONINI, PROBNP in the last 168 hours.  Glucose  Recent Labs Lab 11/21/15 0733 11/21/15 1111 11/21/15 1548 11/21/15 1937 11/21/15 2356 11/22/15 0308  GLUCAP 138* 149* 114* 135* 131* 109*   Imaging Dg Chest Port 1 View  Result Date: 11/21/2015 CLINICAL DATA:  Feeding tube placement EXAM: PORTABLE CHEST 1 VIEW COMPARISON:  11/21/2015 at 06:58 FINDINGS: Tracheostomy appliance appears satisfactorily positioned. Right upper extremity PICC line extends into the low SVC. Enteric tube extends into the stomach and beyond the inferior edge of the image. Basilar consolidation persists, left greater than right. Pleural effusions persist without significant interval change. IMPRESSION: 1.  Support equipment appears satisfactorily positioned. 2. No significant interval change in the left base consolidation. Persistent effusions. Electronically Signed   By: Andreas Newport M.D.   On: 11/21/2015 21:52   Dg Chest Port 1 View  Result Date: 11/21/2015 CLINICAL DATA:  Respiratory failure and  status post CABG. EXAM: PORTABLE CHEST 1 VIEW COMPARISON:  11/19/2015 FINDINGS: Stable right PICC line and tracheostomy positioning. Feeding tube remains. Bilateral lower lobe atelectasis is relatively stable with probable component of small bilateral pleural effusions. Stable mild pulmonary vascular distention without overt airspace edema. No pneumothorax. Stable heart size. IMPRESSION: Stable bilateral lower lobe atelectasis with probable small bilateral pleural effusions. Electronically Signed   By: Aletta Edouard M.D.   On: 11/21/2015 08:38   Dg Abd Portable 1v  Result  Date: 11/21/2015 CLINICAL DATA:  Feeding tube placement.  Initial encounter. EXAM: PORTABLE ABDOMEN - 1 VIEW COMPARISON:  Abdominal radiograph performed 11/18/2015 FINDINGS: The patient's enteric tube is noted coiling within the stomach, and ending at the fundus of the stomach. The visualized bowel gas pattern is grossly unremarkable. Mild degenerative change is noted at the lower lumbar spine. No free intra-abdominal air is seen, though evaluation for free air is limited on a single supine view. IMPRESSION: Enteric tube noted coiling within the stomach, and ending at the fundus of the stomach. Electronically Signed   By: Garald Balding M.D.   On: 11/21/2015 22:04     Studies: Chest [place] 10/26: moderate effusion rt with significant compressive atx, small eff to mod left CT Chest/Abd/Pelvis W/O 10/30: IMPRESSION: 1. Probable congestive heart failure. Patchy consolidation in both lower lobes, left greater than right, worrisome for superimposed pneumonia. 2. Aortic atherosclerosis (ICD10-170.0). 3. Enlarged subcarinal lymph node, likely reactive. 4. Asbestos related pleural disease. TTE 10/31:LV with normal size &EF 50-55%. Normal wall motion. Insufficient to assess diastolic function. LA & RA normal in size. RV normal in size and function. No aortic stenosis with trivial regurgitation. Mild mitral regurgitation without stenosis. Possible [name] is linear projection originating from mitral annular calcification within the left atrium noted. No pulmonic stenosis. No tricuspid regurgitation. No pericardial effusion.  Microbiology: MRSA PCR 10/18: Negative  Tracheal Asp Ctx 10/18: Oral Flora Blood Ctx x2 10/18: Negative Tracheal Asp Ctx 10/19: Oral Flora Tracheal Asp Ctx 10/24: Oral Flora Blood Ctx 10/24: 1/2 Bottles MRSE Blood Ctx 10/26: 1/2 Bottles Coag Neg Staph  C diff 10/28: Negative  Right Pleural Effusion 10/27: Negative  Tracheal Asp Ctx 10/30: Normal respiratory flora,  final Blood Ctx x2 10/31 >>no growth, final Tracheal aspirate 11/4>>>normal resp flora, final Urine 11/4 >>>no growth, final Blood clx x2 11/4 >> no growth, still pending  Antibiotics: Levaquin 10/17 - 10/24 Aztreonam 10/24 - 10/29 Vancomycin 10/17 - 10/18; 10/25 >>> Fluconazole 11/6 >>> stop date 11/9  Significant Events: 10/17 - Admit, cardiac cath, emergent CABG due to NSTEMI. 10/18 - 0600 PCCM consulted for refractory hypoxia 10/25 - reintubated for increased WOB and hypoxia.  10/26 - trached (DF)  Lines/Tubes: OETT 10/17 - 10/26 L Rad Art Line 10/17 - 10/26 R IJ PA Cat 10/17 - out CT x3 10/17 - last removed 10/29 Trach (DF) 10/26 >> RUE DL PICC 10/30 >> R NGT 10/26 >> PIV x1  I reviewed CXR myself, trach in good position.  DISCUSSION: 81 y.o.male presented with increasing dyspnea and a fib with RVR. Found to have 99% Lt main CAD and had emergent CABG 10/17. Major barrier at this point is likely: deconditioning, critical care malnutrition and delirium. The delirium seems better. CXR w/ bilateral ATX/effusion. Fever curve not increased, WBC downa little, pct negative, sputum is pending. Creatinine improving. Cont supportive care. Pt improving  Assessment/Plan: Acute Hypoxic Respiratory Failure: S/P Trach for vocal cord edema &failure to wean. Right >Left atelectasis/effusion Severe deconditioning  and protein calorie malnutrition - Cont ATC he has secretions that intermittent cause hypoxia that he clears well - To chair, standing - PT evaluation - Mucomysts x 2 more doses - PMV as tolerated - Cuff down - May transfer to SDU from PCCM's standpoint  MRSE Bacteremia:  - Vancomycin, have d/w pharmacy, likely will provide  weeks - Repeat blood cultures performed 11/4neg  Acute Encephalopathy/Delirium- improving:  - Seroquel 25 mg bid, will increase back to 50 BID.  NSTEMI: S/P CABG on 10/17.  Atrial Fibrillation w/ RVR:  - Chronic Diastolic  Congestive Heart Failure: Grade 2 dysfunction on 10/24 echocardiogram - Lasix restart - Continuing ASA 325mg , Lipitor 80mg , &Lopressor bid.   ARF resolving P:  - Lasix 40 mg IV daily. - Chem in am  - Replace electrolytes as indicated.  Anemia: - Limit phlebotomy, no cbc needed in am   GI Diarrhea: Cortrak pulled out but stil in stomach, no issues with ileus. C diff negative 10/28. No evidence of worsening P: - Add immodium - Restart TF - SLP  Diabetes Mellitus Type [id number] controlled.  - Continuing SSI per algorithm with accu-checks q4hr.  H/O Moderate COPD:No signs of exacerbation. - Continuing Xopenex nebs tid &Pulmicort neb bid.  H/O GERD: - Continuing Pepcid VT daily.  - Repeat SLP  Prophylaxis: coumadin 11/8  FAMILY - Updates: pt daily  Discussed with bedside RN and PCCM-NP.  Transfer to SDU if ok with CVTS.  Rush Farmer, M.D. [facility] Pulmonary/Critical Care Medicine. Pager: [phone]. After hours pager: [phone].

## 2015-11-22 NOTE — Discharge Instructions (Addendum)
Information on my medicine - Coumadin   (Warfarin)  This medication education was reviewed with me or my healthcare representative as part of my discharge preparation.  Why was Coumadin prescribed for you? Coumadin was prescribed for you because you have a blood clot or a medical condition that can cause an increased risk of forming blood clots. Blood clots can cause serious health problems by blocking the flow of blood to the heart, lung, or brain. Coumadin can prevent harmful blood clots from forming. As a reminder your indication for Coumadin is:   Stroke Prevention Because Of Atrial Fibrillation  What test will check on my response to Coumadin? While on Coumadin (warfarin) you will need to have an INR test regularly to ensure that your dose is keeping you in the desired range. The INR (international normalized ratio) number is calculated from the result of the laboratory test called prothrombin time (PT).  If an INR APPOINTMENT HAS NOT ALREADY BEEN MADE FOR YOU please schedule an appointment to have this lab work done by your health care provider within 7 days. Your INR goal is usually a number between:  2 to 3 or your provider may give you a more narrow range like 2-2.5.  Ask your health care provider during an office visit what your goal INR is.  What  do you need to  know  About  COUMADIN? Take Coumadin (warfarin) exactly as prescribed by your healthcare provider about the same time each day.  DO NOT stop taking without talking to the doctor who prescribed the medication.  Stopping without other blood clot prevention medication to take the place of Coumadin may increase your risk of developing a new clot or stroke.  Get refills before you run out.  What do you do if you miss a dose? If you miss a dose, take it as soon as you remember on the same day then continue your regularly scheduled regimen the next day.  Do not take two doses of Coumadin at the same time.  Important Safety  Information A possible side effect of Coumadin (Warfarin) is an increased risk of bleeding. You should call your healthcare provider right away if you experience any of the following: ? Bleeding from an injury or your nose that does not stop. ? Unusual colored urine (red or dark brown) or unusual colored stools (red or black). ? Unusual bruising for unknown reasons. ? A serious fall or if you hit your head (even if there is no bleeding).  Some foods or medicines interact with Coumadin (warfarin) and might alter your response to warfarin. To help avoid this: ? Eat a balanced diet, maintaining a consistent amount of Vitamin K. ? Notify your provider about major diet changes you plan to make. ? Avoid alcohol or limit your intake to 1 drink for women and 2 drinks for men per day. (1 drink is 5 oz. wine, 12 oz. beer, or 1.5 oz. liquor.)  Make sure that ANY health care provider who prescribes medication for you knows that you are taking Coumadin (warfarin).  Also make sure the healthcare provider who is monitoring your Coumadin knows when you have started a new medication including herbals and non-prescription products.  Coumadin (Warfarin)  Major Drug Interactions  Increased Warfarin Effect Decreased Warfarin Effect  Alcohol (large quantities) Antibiotics (esp. Septra/Bactrim, Flagyl, Cipro) Amiodarone (Cordarone) Aspirin (ASA) Cimetidine (Tagamet) Megestrol (Megace) NSAIDs (ibuprofen, naproxen, etc.) Piroxicam (Feldene) Propafenone (Rythmol SR) Propranolol (Inderal) Isoniazid (INH) Posaconazole (Noxafil) Barbiturates (Phenobarbital) Carbamazepine (  Tegretol) Chlordiazepoxide (Librium) Cholestyramine (Questran) Griseofulvin Oral Contraceptives Rifampin Sucralfate (Carafate) Vitamin K   Coumadin (Warfarin) Major Herbal Interactions  Increased Warfarin Effect Decreased Warfarin Effect  Garlic Ginseng Ginkgo biloba Coenzyme Q10 Green tea St. Johns wort    Coumadin (Warfarin)  FOOD Interactions  Eat a consistent number of servings per week of foods HIGH in Vitamin K (1 serving =  cup)  Collards (cooked, or boiled & drained) Kale (cooked, or boiled & drained) Mustard greens (cooked, or boiled & drained) Parsley *serving size only =  cup Spinach (cooked, or boiled & drained) Swiss chard (cooked, or boiled & drained) Turnip greens (cooked, or boiled & drained)  Eat a consistent number of servings per week of foods MEDIUM-HIGH in Vitamin K (1 serving = 1 cup)  Asparagus (cooked, or boiled & drained) Broccoli (cooked, boiled & drained, or raw & chopped) Brussel sprouts (cooked, or boiled & drained) *serving size only =  cup Lettuce, raw (green leaf, endive, romaine) Spinach, raw Turnip greens, raw & chopped   These websites have more information on Coumadin (warfarin):  FailFactory.se; VeganReport.com.au;   Coronary Artery Bypass Grafting, Care After Refer to this sheet in the next few weeks. These instructions provide you with information on caring for yourself after your procedure. Your health care provider may also give you more specific instructions. Your treatment has been planned according to current medical practices, but problems sometimes occur. Call your health care provider if you have any problems or questions after your procedure. WHAT TO EXPECT AFTER THE PROCEDURE Recovery from surgery will be different for everyone. Some people feel well after 3 or 4 weeks, while for others it takes longer. After your procedure, it is typical to have the following:  Nausea and a lack of appetite.   Constipation.  Weakness and fatigue.   Depression or irritability.   Pain or discomfort at your incision site. HOME CARE INSTRUCTIONS  Take medicines only as directed by your health care provider. Do not stop taking medicines or start any new medicines without first checking with your health care provider.  Take your pulse as directed by  your health care provider.  Perform deep breathing as directed by your health care provider. If you were given a device called an incentive spirometer, use it to practice deep breathing several times a day. Support your chest with a pillow or your arms when you take deep breaths or cough.  Keep incision areas clean, dry, and protected. Remove or change any bandages (dressings) only as directed by your health care provider. You may have skin adhesive strips over the incision areas. Do not take the strips off. They will fall off on their own.  Check incision areas daily for any swelling, redness, or drainage.  If incisions were made in your legs, do the following:  Avoid crossing your legs.   Avoid sitting for long periods of time. Change positions every 30 minutes.   Elevate your legs when you are sitting.  Wear compression stockings as directed by your health care provider. These stockings help keep blood clots from forming in your legs.  Take showers once your health care provider approves. Until then, only take sponge baths. Pat incisions dry. Do not rub incisions with a washcloth or towel. Do not take baths, swim, or use a hot tub until your health care provider approves.  Eat foods that are high in fiber, such as raw fruits and vegetables, whole grains, beans, and nuts. Meats should be lean  cut. Avoid canned, processed, and fried foods.  Drink enough fluid to keep your urine clear or pale yellow.  Weigh yourself every day. This helps identify if you are retaining fluid that may make your heart and lungs work harder.  Rest and limit activity as directed by your health care provider. You may be instructed to:  Stop any activity at once if you have chest pain, shortness of breath, irregular heartbeats, or dizziness. Get help right away if you have any of these symptoms.  Move around frequently for short periods or take short walks as directed by your health care provider. Increase  your activities gradually. You may need physical therapy or cardiac rehabilitation to help strengthen your muscles and build your endurance.  Avoid lifting, pushing, or pulling anything heavier than 10 lb (4.5 kg) for at least 6 weeks after surgery.  Do not drive until your health care provider approves.  Ask your health care provider when you may return to work.  Ask your health care provider when you may resume sexual activity.  Keep all follow-up visits as directed by your health care provider. This is important. SEEK MEDICAL CARE IF:  You have swelling, redness, increasing pain, or drainage at the site of an incision.  You have a fever.  You have swelling in your ankles or legs.  You have pain in your legs.   You gain 2 or more pounds (0.9 kg) a day.  You are nauseous or vomit.  You have diarrhea. SEEK IMMEDIATE MEDICAL CARE IF:  You have chest pain that goes to your jaw or arms.  You have shortness of breath.   You have a fast or irregular heartbeat.   You notice a "clicking" in your breastbone (sternum) when you move.   You have numbness or weakness in your arms or legs.  You feel dizzy or light-headed.  MAKE SURE YOU:  Understand these instructions.  Will watch your condition.  Will get help right away if you are not doing well or get worse. This information is not intended to replace advice given to you by your health care provider. Make sure you discuss any questions you have with your health care provider. Document Released: 07/15/2004 Document Revised: 01/16/2014 Document Reviewed: 06/04/2012 Elsevier Interactive Patient Education  2017 [employer].

## 2015-11-22 NOTE — Progress Notes (Signed)
Physical Therapy Treatment Patient Details Name: Mark Rivers MRN: [id number] DOB: 1934/10/07 Today's Date: 11/22/2015    History of Present Illness 81 yo with admission for chest pain, Afib with RVR s/p CABGx3 PMhx: CAD, CKD, and COPD     PT Comments    Pt very deconditioned. Pt tolerated 8' of ambulation with eva walker with max encouragement and assist of 3 people. Pt given HEP and RN aware. Pt encouraged to do HEP 3-5x/day while in bed to improve LE strength. Pt also appears to depressed spirits.  Acute PT to con't to follow.   Follow Up Recommendations  SNF     Equipment Recommendations  Rolling walker with 5" wheels;3in1 (PT)    Recommendations for Other Services OT consult;Speech consult     Precautions / Restrictions Precautions Precautions: Fall;Sternal Precaution Comments: trach, flexiseal, panda Restrictions Weight Bearing Restrictions: No    Mobility  Bed Mobility Overal bed mobility: Needs Assistance Bed Mobility: Rolling;Sidelying to Sit Rolling: Mod assist Sidelying to sit: Mod assist       General bed mobility comments: with max v/c's pt able to bring LEs off EOB, maxA for trunk elevation due to sternal precuations and minimizing use of UEs to push/pull  Transfers Overall transfer level: Needs assistance Equipment used:  (eva walker) Transfers: Sit to/from Stand Sit to Stand: Max assist;+2 physical assistance;+2 safety/equipment         General transfer comment: rocking to gain momentum, bilat blocking of knee, assit to power up  Ambulation/Gait Ambulation/Gait assistance: Max assist;+2 physical assistance;+2 safety/equipment Ambulation Distance (Feet): 8 Feet Assistive device:  (eva walker) Gait Pattern/deviations: Step-to pattern;Narrow base of support;Trunk flexed Gait velocity: slow Gait velocity interpretation: Below normal speed for age/gender General Gait Details: max v/c's to contract buttock to stand up tall, PT providing max  tactile cues to posterior hips to provide support and optimal positioning, RN to assist with lines and controling EVA walker and another RN to push chair behind. pt with labored breathing but SpO2 at 98%. max encouragement to attempt and complete amb, v/c's to increase base of support   Stairs            Wheelchair Mobility    Modified Rankin (Stroke Patients Only)       Balance Overall balance assessment: Needs assistance Sitting-balance support: Feet supported;Single extremity supported Sitting balance-Leahy Scale: Poor Sitting balance - Comments: pt constantly leaning to the Left   Standing balance support: Bilateral upper extremity supported Standing balance-Leahy Scale: Poor Standing balance comment: needs RW                    Cognition Arousal/Alertness: Awake/alert (but easily falls asleep) Behavior During Therapy: Anxious;Impulsive Overall Cognitive Status: Impaired/Different from baseline Area of Impairment: Following commands;Safety/judgement;Awareness;Problem solving   Current Attention Level: Sustained Memory: Decreased recall of precautions Following Commands: Follows one step commands inconsistently Safety/Judgement: Decreased awareness of safety;Decreased awareness of deficits Awareness: Intellectual Problem Solving: Slow processing;Decreased initiation;Difficulty sequencing;Requires verbal cues;Requires tactile cues General Comments: pt with difficulty breathing making pt anxious requiring max v/c's to stay on task and complete task    Exercises General Exercises - Lower Extremity Ankle Circles/Pumps: AROM;Both;10 reps;Supine Quad Sets: AROM;Both;10 reps;Supine Gluteal Sets: AROM;Both;10 reps;Supine Heel Slides: AROM;Both;5 reps;Supine    General Comments        Pertinent Vitals/Pain Pain Assessment: Faces Faces Pain Scale: Hurts little more Pain Location: pt grimaces with mobility, reaching for chest Pain Intervention(s): Monitored  during session    Home Living  Prior Function            PT Goals (current goals can now be found in the care plan section) Acute Rehab PT Goals Patient Stated Goal: didn't state Progress towards PT goals: Progressing toward goals    Frequency    Min 3X/week      PT Plan Current plan remains appropriate    Co-evaluation             End of Session Equipment Utilized During Treatment: Gait belt;Oxygen Activity Tolerance: Patient limited by fatigue Patient left: in bed;with call bell/phone within reach;with bed alarm set     Time: [id number] PT Time Calculation (min) (ACUTE ONLY): 26 min  Charges:  $Gait Training: 8-22 mins $Therapeutic Exercise: 8-22 mins                    G Codes:      Kingsley Callander 11/22/2015, 11:43 AM  Kittie Plater, PT, DPT Pager #: [phone] Office #: [phone]

## 2015-11-22 NOTE — Progress Notes (Signed)
27 Days Post-Op Procedure(s) (LRB): CORONARY ARTERY BYPASS GRAFTING (CABG) x3(LIMA to LA, SVG to OM1, SVG to PDA) with EVH from the left thigh and partial lower leg greater saphenous vein and left internal mammary artery (N/A) TRANSESOPHAGEAL ECHOCARDIOGRAM (TEE) (N/A) INTRA-AORTIC BALLOON PUMP INSERTION size 40cc (Left) Subjective: Wt down to baseline- chg lasix to po Unable to walk and still requires 2 person assist Airway secretions better Keep in ICU- needs feeding tube Objective: Vital signs in last 24 hours: Temp:  [97.8 F (36.6 C)-98.7 F (37.1 C)] 98.1 F (36.7 C) (11/13 0726) Pulse Rate:  [40-88] 79 (11/13 0710) Cardiac Rhythm: Atrial fibrillation (11/13 0800) Resp:  [20-34] 25 (11/13 0710) BP: (102-167)/(49-107) 127/107 (11/13 0600) SpO2:  [90 %-96 %] 94 % (11/13 0710) FiO2 (%):  [35 %] 35 % (11/13 0710) Weight:  [205 lb 14.6 oz (93.4 kg)] 205 lb 14.6 oz (93.4 kg) (11/13 0500)  Hemodynamic parameters for last 24 hours:  controlled afib  Intake/Output from previous day: 11/12 0701 - 11/13 0700 In: 1540 [I.V.:10; NG/GT:1030; IV Piggyback:500] Out: 2175 [Urine:2175] Intake/Output this shift: Total I/O In: 30 [NG/GT:30] Out: 145 [Urine:145]  incisions clean, dry  Lab Results:  Recent Labs  11/21/15 0315 11/22/15 0330  WBC 9.4 10.3  HGB 9.0* 8.6*  HCT 29.4* 28.4*  PLT 312 372   BMET:  Recent Labs  11/21/15 0315 11/22/15 0330  NA 141 142  K 4.2 4.0  CL 104 106  CO2 30 30  GLUCOSE 125* 112*  BUN 39* 38*  CREATININE 1.04 1.01  CALCIUM 8.4* 8.5*    PT/INR:  Recent Labs  11/22/15 0330  LABPROT 22.5*  INR 1.94   ABG    Component Value Date/Time   PHART 7.381 11/04/2015 0330   HCO3 30.7 (H) 11/04/2015 0330   TCO2 31 11/05/2015 1803   ACIDBASEDEF 1.0 10/29/2015 1531   O2SAT 91.3 11/04/2015 0330   CBG (last 3)   Recent Labs  11/21/15 2356 11/22/15 0308 11/22/15 0720  GLUCAP 131* 109* 110*    Assessment/Plan: S/P Procedure(s)  (LRB): CORONARY ARTERY BYPASS GRAFTING (CABG) x3(LIMA to LA, SVG to OM1, SVG to PDA) with EVH from the left thigh and partial lower leg greater saphenous vein and left internal mammary artery (N/A) TRANSESOPHAGEAL ECHOCARDIOGRAM (TEE) (N/A) INTRA-AORTIC BALLOON PUMP INSERTION size 40cc (Left) Mobilize Diabetes control d/c pacing wires cont current care   needs PT to the max   LOS: 28 days    Tharon Aquas Trigt III 11/22/2015

## 2015-11-22 NOTE — Care Management Important Message (Signed)
Important Message  Patient Details  Name: Mark Rivers MRN: [id number] Date of Birth: Sep 16, 1934   Medicare Important Message Given:  Yes    [name] 11/22/2015, 10:22 AM

## 2015-11-22 NOTE — Progress Notes (Signed)
PT refused CPT. 

## 2015-11-22 NOTE — Progress Notes (Signed)
Speech Language Pathology Treatment: Dysphagia;Passy Muir Speaking valve  Patient Details Name: Mark Rivers MRN: [id number] DOB: [birth year] Today's Date: 11/22/2015 Time: [id number] SLP Time Calculation (min) (ACUTE ONLY): 21 min  Assessment / Plan / Recommendation Clinical Impression  PMV was placed for 20 min without overt signs of intolerance. Pt had just finished with PT and seemed frustrated today, but agreeable to participate in limited trials of PO. Pt had four bites of puree and completed 20 effortful swallows. Attempted to teach the masako maneuver, but with pt's low frustration threshold today he would not complete this exercise. He then declined all further PO intake. Recommend to continue NPO status with SLP f/u for additional strengthening exercises. Would wear PMV with close supervision to facilitate communication.   HPI HPI: 81 y.o.male presented with increasing dyspnea and a fib with RVR. Found to have 99% Lt main CAD and had emergent CABG 10/17. He remained intubated 10/17-10/26 until trach placed. PMH includes CAD, HTN, GERD, DM      SLP Plan  Continue with current plan of care     Recommendations  Diet recommendations: NPO Medication Administration: Via alternative means      Patient may use Passy-Muir Speech Valve: Intermittently with supervision;During all therapies with supervision PMSV Supervision: Intermittent (close supervision)         Oral Care Recommendations: Oral care QID Follow up Recommendations: Skilled Nursing facility Plan: Continue with current plan of care       GO                [name] 11/22/2015, 2:58 PM  [name], M.A. CCC-SLP [phone]

## 2015-11-22 NOTE — Progress Notes (Signed)
Nutrition Follow Up  DOCUMENTATION CODES:   Obesity unspecified  INTERVENTION:    Increase Jevity 1.2 formula at goal rate of 65 ml/hr   Prostat liquid protein 30 ml BID via tube  New TF regimen to provide 2072 kcals, 116 gm protein, 1259 ml of free water  NUTRITION DIAGNOSIS:   Inadequate oral intake related to inability to eat as evidenced by NPO status, ongoing  GOAL:   Patient will meet greater than or equal to 90% of their needs, met  MONITOR:   TF tolerance, Vent status, Labs, Weight trends, Skin, I & O's  ASSESSMENT:   81 yo WM with PMH significant for but not limited to, CAD with RCA stent 1988, IDDM,HTN, GERD; who has had increasing DOE for 3 weeks prior to admit. 10/16 he presented with acute chest pain, increased SOB and new Afib with RVR. CC revealed 99% Left main and he was taken urgently to CABGx 3 per CVTS 10/17 1730 and presented to SICU 2230.   Patient s/p procedure 10/16: CORONARY ARTERY BYPASS GRAFTING (CABG) x 3  INTRA-AORTIC BALLOON PUMP INSERTION  Pt now on trach collar. Presented 10/17 with NSTEMI >> required urgent surgery. Developed severe hypoxia in AM 10/18. Jevity 1.2 formula currently infusing at 40 ml/hr via CORTRAK feeding tube (tip in duodenum).  Pt's weight has been trending down >> will increase TF at this time.  10/20: Verbal with readback order received per Dr. Prescott Gum to manage TF  Diet Order:  Diet NPO time specified   CBG (last 3)   Recent Labs  11/22/15 0308 11/22/15 0720 11/22/15 1153  GLUCAP 109* 110* 139*   Skin:  Wound (see comment) (Stage II to buttock)  Last BM:  11/13  Height:   Ht Readings from Last 1 Encounters:  11/20/15 5' 6" (1.676 m)    Weight:   Wt Readings from Last 1 Encounters:  11/22/15 205 lb 14.6 oz (93.4 kg)   11/12  204 lb 11/11  203 lb 11/10  208 lb 11/09  207 lb 11/08  209 lb 11/07  215 lb 11/06  218 lb 11/05  218 lb 11/04  219 lb 11/03  218 lb 11/02  218 lb 11/01  218  lb  Ideal Body Weight:  59 kg  BMI:  Body mass index is 33.23 kg/m.  Re-estimated Nutritional Needs:   Kcal:  2000-2200  Protein:  110-120 gm  Fluid:  2.0-2.2 L  EDUCATION NEEDS:   No education needs identified at this time  [name], RD, LDN Pager #: [phone] After-Hours Pager #: [phone]

## 2015-11-22 NOTE — Progress Notes (Signed)
T[facility] BRIEF SICU PROGRESS NOTE  27 Days Post-Op  S/P Procedure(s) (LRB): CORONARY ARTERY BYPASS GRAFTING (CABG) x3(LIMA to LA, SVG to OM1, SVG to PDA) with EVH from the left thigh and partial lower leg greater saphenous vein and left internal mammary artery (N/A) TRANSESOPHAGEAL ECHOCARDIOGRAM (TEE) (N/A) INTRA-AORTIC BALLOON PUMP INSERTION size 40cc (Left)   Stable day  Plan: Continue current plan  Rexene Alberts, MD 11/22/2015 6:28 PM

## 2015-11-23 LAB — PROTIME-INR
INR: 3.1
Prothrombin Time: 32.7 seconds — ABNORMAL HIGH (ref 11.4–15.2)

## 2015-11-23 LAB — GLUCOSE, CAPILLARY
Glucose-Capillary: 126 mg/dL — ABNORMAL HIGH (ref 65–99)
Glucose-Capillary: 137 mg/dL — ABNORMAL HIGH (ref 65–99)
Glucose-Capillary: 172 mg/dL — ABNORMAL HIGH (ref 65–99)
Glucose-Capillary: 103 mg/dL — ABNORMAL HIGH (ref 65–99)
Glucose-Capillary: 155 mg/dL — ABNORMAL HIGH (ref 65–99)
Glucose-Capillary: 184 mg/dL — ABNORMAL HIGH (ref 65–99)

## 2015-11-23 LAB — BASIC METABOLIC PANEL
Anion gap: 6 (ref 5–15)
BUN: 35 mg/dL — AB (ref 6–20)
CALCIUM: 8.2 mg/dL — AB (ref 8.9–10.3)
CO2: 29 mmol/L (ref 22–32)
CREATININE: 1.05 mg/dL (ref 0.61–1.24)
Chloride: 108 mmol/L (ref 101–111)
GFR calc Af Amer: >60 mL/min (ref >60)
GLUCOSE: 137 mg/dL — AB (ref 65–99)
POTASSIUM: 3.8 mmol/L (ref 3.5–5.1)
SODIUM: 143 mmol/L (ref 135–145)

## 2015-11-23 LAB — CBC
HEMATOCRIT: 27.8 % — AB (ref 39.0–52.0)
Hemoglobin: 8.3 g/dL — ABNORMAL LOW (ref 13.0–17.0)
MCH: 29.5 pg (ref 26.0–34.0)
MCHC: 29.9 g/dL — AB (ref 30.0–36.0)
MCV: 98.9 fL (ref 78.0–100.0)
PLATELETS: 320 10*3/uL (ref 150–400)
RBC: 2.81 MIL/uL — ABNORMAL LOW (ref 4.22–5.81)
RDW: 16.8 % — AB (ref 11.5–15.5)
WBC: 10.6 10*3/uL — ABNORMAL HIGH (ref 4.0–10.5)

## 2015-11-23 LAB — PHOSPHORUS: Phosphorus: 3.3 mg/dL (ref 2.5–4.6)

## 2015-11-23 LAB — MAGNESIUM: MAGNESIUM: 2 mg/dL (ref 1.7–2.4)

## 2015-11-23 MED ORDER — TRAZODONE HCL 50 MG PO TABS
50.0000 mg | ORAL_TABLET | Freq: Every day | ORAL | Status: DC
  Administered 2015-11-23: 50 mg via ORAL
  Filled 2015-11-23: qty 1

## 2015-11-23 NOTE — Progress Notes (Signed)
Speech Language Pathology Treatment: Dysphagia;[place] Speaking valve  Patient Details Name: Mark Rivers MRN: [id number] DOB: 1934/09/05 Today's Date: 11/23/2015 Time: 1130-1150 SLP Time Calculation (min) (ACUTE ONLY): 20 min  Assessment / Plan / Recommendation Clinical Impression  Pt tolerated PMV for 20 minute session with stable VS. SLP administered trials of pureed solids for theraputic exercises. Given moderate cueing, he achieved approximately 15 effortful swallows across 5 PO trials. SLP re-attempted to teach the masako maneuver and pt made several attempts to complete it. After multiple attempts, pt fatigued and refused further swallowing exercises. Recommend continued status of NPO. PMV may be worn with intermittent, close supervision. Will continue to follow for PMV and dysphagia treatment to aid in facilitating return to PO diet.   HPI HPI: 81 y.o.male presented with increasing dyspnea and a fib with RVR. Found to have 99% Lt main CAD and had emergent CABG 10/17. He remained intubated 10/17-10/26 until trach placed. PMH includes CAD, HTN, GERD, DM      SLP Plan  Continue with current plan of care     Recommendations  Diet recommendations: NPO Medication Administration: Via alternative means      Patient may use Passy-Muir Speech Valve: Intermittently with supervision;During all therapies with supervision PMSV Supervision: Intermittent (close supervision)         Oral Care Recommendations: Oral care QID Follow up Recommendations: Skilled Nursing facility Plan: Continue with current plan of care       [place], Student SLP  Shela Leff 11/23/2015, 1:45 PM

## 2015-11-23 NOTE — Progress Notes (Addendum)
Transferred to 3S12 via bed. Portable monitor and oxygen on. Report given to Rodman Key, RN. No changes.

## 2015-11-23 NOTE — Progress Notes (Signed)
PULMONARY / CRITICAL CARE MEDICINE   Name: Mark Rivers MRN: [id number] DOB: March 14, 1934    ADMISSION DATE:  10/25/2015 CONSULTATION DATE:10/27/15  Referring provider: Dr. Prescott Gum  [id number] of breath  Brief history 81 y.o.male presented with increasing dyspnea and a fib with RVR. Found to have 99% Lt main CAD and had emergent CABG 10/17.  SUBJECTIVE:  Up in chair, on trach collar. C/O being tired in the chair and wants to go back to bed. 35% FiO2.  Cortrak now in his stomach  VITAL SIGNS: BP (!) 160/57 (BP Location: Left Arm)   Pulse 80   Temp 97.8 F (36.6 C) (Oral)   Resp (!) 26   Ht 5\' 6"  (1.676 m)   Wt 89.6 kg (197 lb 8 oz)   SpO2 96%   BMI 31.88 kg/m   HEMODYNAMICS:    VENTILATOR SETTINGS: FiO2 (%):  [35 %-40 %] 40 %  INTAKE / OUTPUT: I/O last 3 completed shifts: In: 2072.4 [NG/GT:1572.4; IV Piggyback:500] Out: 2870 [Urine:2870]  PHYSICAL EXAMINATION: General:  NAD, in chair Neuro:  Awake, alert, responds appropriately HEENT:  PEERL, /AT Cardiovascular:  s1 s2 RRR Lungs: Coarse BS that clears with coughing. Abdomen:  Soft, nontender, nondistended, bowel sounds present Musculoskeletal:  Moves all 4 extremities Skin:  Warm, dry, intact, excoriations unchanged  LABS:  BMET  Recent Labs Lab 11/21/15 0315 11/22/15 0330 11/23/15 0410  NA 141 142 143  K 4.2 4.0 3.8  CL 104 106 108  CO2 30 30 29   BUN 39* 38* 35*  CREATININE 1.04 1.01 1.05  GLUCOSE 125* 112* 137*    Electrolytes  Recent Labs Lab 11/17/15 0410  11/21/15 0315 11/22/15 0330 11/23/15 0410  CALCIUM 8.3*  < > 8.4* 8.5* 8.2*  MG 2.2  < > 2.0 2.0 2.0  PHOS 3.2  --   --   --  3.3  < > = values in this interval not displayed.  CBC  Recent Labs Lab 11/21/15 0315 11/22/15 0330 11/23/15 0410  WBC 9.4 10.3 10.6*  HGB 9.0* 8.6* 8.3*  HCT 29.4* 28.4* 27.8*  PLT 312 372 320   Coag's  Recent Labs Lab 11/21/15 0315 11/22/15 0330 11/23/15 0410  INR 1.72 1.94  3.10   Sepsis Markers No results for input(s): LATICACIDVEN, PROCALCITON, O2SATVEN in the last 168 hours.  ABG No results for input(s): PHART, PCO2ART, PO2ART in the last 168 hours.  Liver Enzymes  Recent Labs Lab 11/18/15 0405  AST 55*  ALT 62  ALKPHOS 76  BILITOT 0.7  ALBUMIN 1.9*   Cardiac Enzymes No results for input(s): TROPONINI, PROBNP in the last 168 hours.  Glucose  Recent Labs Lab 11/22/15 1153 11/22/15 1551 11/22/15 1924 11/22/15 2340 11/23/15 0356 11/23/15 0830  GLUCAP 139* 113* 156* 183* 126* 137*   Imaging No results found.   Studies: Chest [place] 10/26: moderate effusion rt with significant compressive atx, small eff to mod left CT Chest/Abd/Pelvis W/O 10/30: IMPRESSION: 1. Probable congestive heart failure. Patchy consolidation in both lower lobes, left greater than right, worrisome for superimposed pneumonia. 2. Aortic atherosclerosis (ICD10-170.0). 3. Enlarged subcarinal lymph node, likely reactive. 4. Asbestos related pleural disease. TTE 10/31:LV with normal size &EF 50-55%. Normal wall motion. Insufficient to assess diastolic function. LA & RA normal in size. RV normal in size and function. No aortic stenosis with trivial regurgitation. Mild mitral regurgitation without stenosis. Possible [name] is linear projection originating from mitral annular calcification within the left atrium noted. No pulmonic stenosis. No tricuspid  regurgitation. No pericardial effusion.  Microbiology: MRSA PCR 10/18: Negative  Tracheal Asp Ctx 10/18: Oral Flora Blood Ctx x2 10/18: Negative Tracheal Asp Ctx 10/19: Oral Flora Tracheal Asp Ctx 10/24: Oral Flora Blood Ctx 10/24: 1/2 Bottles MRSE Blood Ctx 10/26: 1/2 Bottles Coag Neg Staph  C diff 10/28: Negative  Right Pleural Effusion 10/27: Negative  Tracheal Asp Ctx 10/30: Normal respiratory flora, final Blood Ctx x2 10/31 >>no growth, final Tracheal aspirate 11/4>>>normal resp flora,  final Urine 11/4 >>>no growth, final Blood clx x2 11/4 >> no growth, still pending  Antibiotics: Levaquin 10/17 - 10/24 Aztreonam 10/24 - 10/29 Vancomycin 10/17 - 10/18; 10/25 >>> Fluconazole 11/6 >>> stop date 11/9  Significant Events: 10/17 - Admit, cardiac cath, emergent CABG due to NSTEMI. 10/18 - 0600 PCCM consulted for refractory hypoxia 10/25 - reintubated for increased WOB and hypoxia.  10/26 - trached (DF)  Lines/Tubes: OETT 10/17 - 10/26 L Rad Art Line 10/17 - 10/26 R IJ PA Cat 10/17 - out CT x3 10/17 - last removed 10/29 Trach (DF) 10/26 >> RUE DL PICC 10/30 >> R NGT 10/26 >> PIV x1  I reviewed AXR myself, tube coiled in the stomach  DISCUSSION: 81 y.o.male presented with increasing dyspnea and a fib with RVR. Found to have 99% Lt main CAD and had emergent CABG 10/17. Major barrier at this point is likely: deconditioning, critical care malnutrition and delirium. The delirium seems better. CXR w/ bilateral ATX/effusion. Fever curve not increased, WBC downa little, pct negative, sputum is pending. Creatinine improving. Cont supportive care. Pt improving  Assessment/Plan: Acute Hypoxic Respiratory Failure: S/P Trach for vocal cord edema &failure to wean. Right >Left atelectasis/effusion. Severe deconditioning and protein calorie malnutrition. - Cont ATC he has secretions that intermittent cause hypoxia that he clears well - To chair as able. - PT evaluation - Mucomysts x 2 more doses - PMV as tolerated - Cuff down - May transfer to SDU from [facility] standpoint - Keep dry as able.  MRSE Bacteremia:  - Vancomycin, have d/w pharmacy, likely will provide 2 weeks - Repeat blood cultures performed 11/4neg  Acute Encephalopathy/Delirium- improving:  - Seroquel 50 mg bid - Add trazodone at 50, if ineffective will increase to 100 QHS  NSTEMI: S/P CABG on 10/17.  Atrial Fibrillation w/ RVR:  - Chronic Diastolic Congestive Heart Failure: Grade 2  dysfunction on 10/24 echocardiogram - Lasix 40 mg PO daily - Continuing ASA 325mg , Lipitor 80mg , &Lopressor bid.   ARF resolving P:  - Lasix 40 mg PO daily. - Chem in am  - Replace electrolytes as indicated.  Anemia: - Limit phlebotomy, no cbc needed in am   GI Diarrhea: Cortrak pulled out but stil in stomach, no issues with ileus. C diff negative 10/28. No evidence of worsening P: - Added immodium. - Continue TF. - SLP failed, will repeat towards the end of the week.  Diabetes Mellitus Type [id number] controlled.  - Continuing SSI per algorithm with accu-checks q4hr.  H/O Moderate COPD:No signs of exacerbation. - Continuing Xopenex nebs tid &Pulmicort neb bid.  H/O GERD: - Continuing Pepcid VT daily.  - Repeat SLP  Prophylaxis: coumadin  FAMILY - Updates: pt daily  Discussed with bedside RN and PCCM-NP.  Transfer to SDU if ok with CVTS.  Rush Farmer, M.D. [facility] Pulmonary/Critical Care Medicine. Pager: [phone]. After hours pager: [phone].

## 2015-11-23 NOTE — Care Management Note (Signed)
Case Management Note  Patient Details  Name: Mark Rivers MRN: [id number] Date of Birth: [birth year]  Subjective/Objective:    Met with pt and brother @ bedside to provide information about LTAC, discussed both [facility] and [facility].  Pt and brother agree that LTAC is a good option, choose [facility].  Liaison notified and will review chart for eligibility.               Expected Discharge Plan:  Long Term Acute Care (LTAC)  Discharge planning Services  CM Consult  Choice offered to:  Patient, Sibling  Status of Service:  In process, will continue to follow  Girard Cooter, RN 11/23/2015, 12:08 PM

## 2015-11-23 NOTE — Progress Notes (Signed)
Changed trach set up and water bottle

## 2015-11-23 NOTE — Progress Notes (Signed)
28 Days Post-Op Procedure(s) (LRB): CORONARY ARTERY BYPASS GRAFTING (CABG) x3(LIMA to LA, SVG to OM1, SVG to PDA) with EVH from the left thigh and partial lower leg greater saphenous vein and left internal mammary artery (N/A) TRANSESOPHAGEAL ECHOCARDIOGRAM (TEE) (N/A) INTRA-AORTIC BALLOON PUMP INSERTION size 40cc (Left) Subjective: Did not sleep well last pm Walked 10 ft with PT yesterday vanco stopped after 14 day therapy Airway secretions better Rate controlled a-fib with coumadin anticoagulation Swallow study- not ready for po diet- cont TF Ready for long term care faciclity  Objective: Vital signs in last 24 hours: Temp:  [97.3 F (36.3 C)-98.3 F (36.8 C)] 97.8 F (36.6 C) (11/14 0832) Pulse Rate:  [52-117] 75 (11/14 1000) Cardiac Rhythm: Atrial fibrillation (11/14 0800) Resp:  [20-30] 24 (11/14 1000) BP: (91-168)/(46-79) 126/56 (11/14 1000) SpO2:  [86 %-100 %] 93 % (11/14 1000) FiO2 (%):  [35 %-40 %] 40 % (11/14 0750) Weight:  [197 lb 8 oz (89.6 kg)] 197 lb 8 oz (89.6 kg) (11/14 0500)  Hemodynamic parameters for last 24 hours:    Intake/Output from previous day: 11/13 0701 - 11/14 0700 In: 1992.4 [NG/GT:1492.4; IV Piggyback:500] Out: 1920 [Urine:1920] Intake/Output this shift: Total I/O In: 195 [NG/GT:195] Out: 250 [Urine:250]       Exam    General- alert and comfortable   Lungs- clear without rales, wheezes   Cor- regular rate and rhythm, no murmur , gallop   Abdomen- soft, non-tender   Extremities - warm, non-tender, minimal edema   Neuro- oriented, appropriate, no focal weakness   Lab Results:  Recent Labs  11/22/15 0330 11/23/15 0410  WBC 10.3 10.6*  HGB 8.6* 8.3*  HCT 28.4* 27.8*  PLT 372 320   BMET:  Recent Labs  11/22/15 0330 11/23/15 0410  NA 142 143  K 4.0 3.8  CL 106 108  CO2 30 29  GLUCOSE 112* 137*  BUN 38* 35*  CREATININE 1.01 1.05  CALCIUM 8.5* 8.2*    PT/INR:  Recent Labs  11/23/15 0410  LABPROT 32.7*  INR 3.10    ABG    Component Value Date/Time   PHART 7.381 11/04/2015 0330   HCO3 30.7 (H) 11/04/2015 0330   TCO2 31 11/05/2015 1803   ACIDBASEDEF 1.0 10/29/2015 1531   O2SAT 91.3 11/04/2015 0330   CBG (last 3)   Recent Labs  11/22/15 2340 11/23/15 0356 11/23/15 0830  GLUCAP 183* 126* 137*    Assessment/Plan: S/P Procedure(s) (LRB): CORONARY ARTERY BYPASS GRAFTING (CABG) x3(LIMA to LA, SVG to OM1, SVG to PDA) with EVH from the left thigh and partial lower leg greater saphenous vein and left internal mammary artery (N/A) TRANSESOPHAGEAL ECHOCARDIOGRAM (TEE) (N/A) INTRA-AORTIC BALLOON PUMP INSERTION size 40cc (Left) Hold coumadin tonite Check CXR in am May transfer to 3 S bed  LOS: 29 days    Tharon Aquas Trigt III 11/23/2015

## 2015-11-24 ENCOUNTER — Inpatient Hospital Stay (HOSPITAL_COMMUNITY): Payer: Medicare Other

## 2015-11-24 DIAGNOSIS — J9611 Chronic respiratory failure with hypoxia: Secondary | ICD-10-CM

## 2015-11-24 LAB — BASIC METABOLIC PANEL
Anion gap: 9 (ref 5–15)
BUN: 32 mg/dL — AB (ref 6–20)
CO2: 31 mmol/L (ref 22–32)
Calcium: 8.5 mg/dL — ABNORMAL LOW (ref 8.9–10.3)
Chloride: 103 mmol/L (ref 101–111)
Creatinine, Ser: 1.1 mg/dL (ref 0.61–1.24)
GFR calc Af Amer: >60 mL/min (ref >60)
GLUCOSE: 152 mg/dL — AB (ref 65–99)
POTASSIUM: 4.4 mmol/L (ref 3.5–5.1)
Sodium: 143 mmol/L (ref 135–145)

## 2015-11-24 LAB — GLUCOSE, CAPILLARY
Glucose-Capillary: 115 mg/dL — ABNORMAL HIGH (ref 65–99)
Glucose-Capillary: 117 mg/dL — ABNORMAL HIGH (ref 65–99)
Glucose-Capillary: 137 mg/dL — ABNORMAL HIGH (ref 65–99)
Glucose-Capillary: 141 mg/dL — ABNORMAL HIGH (ref 65–99)
Glucose-Capillary: 144 mg/dL — ABNORMAL HIGH (ref 65–99)
Glucose-Capillary: 148 mg/dL — ABNORMAL HIGH (ref 65–99)

## 2015-11-24 LAB — CBC
HCT: 29.7 % — ABNORMAL LOW (ref 39.0–52.0)
Hemoglobin: 8.7 g/dL — ABNORMAL LOW (ref 13.0–17.0)
MCH: 29.2 pg (ref 26.0–34.0)
MCHC: 29.3 g/dL — ABNORMAL LOW (ref 30.0–36.0)
MCV: 99.7 fL (ref 78.0–100.0)
Platelets: 360 10*3/uL (ref 150–400)
RBC: 2.98 MIL/uL — ABNORMAL LOW (ref 4.22–5.81)
RDW: 17 % — ABNORMAL HIGH (ref 11.5–15.5)
WBC: 11.1 10*3/uL — ABNORMAL HIGH (ref 4.0–10.5)

## 2015-11-24 LAB — MAGNESIUM: MAGNESIUM: 2.2 mg/dL (ref 1.7–2.4)

## 2015-11-24 LAB — PROTIME-INR
INR: 2.68
Prothrombin Time: 29 seconds — ABNORMAL HIGH (ref 11.4–15.2)

## 2015-11-24 LAB — PHOSPHORUS: Phosphorus: 3.3 mg/dL (ref 2.5–4.6)

## 2015-11-24 MED ORDER — DILTIAZEM 12 MG/ML ORAL SUSPENSION
30.0000 mg | Freq: Two times a day (BID) | ORAL | Status: DC
Start: 2015-11-24 — End: 2015-11-26
  Administered 2015-11-24 – 2015-11-26 (×4): 30 mg via ORAL
  Filled 2015-11-24 (×7): qty 3

## 2015-11-24 MED ORDER — TRAZODONE HCL 150 MG PO TABS
75.0000 mg | ORAL_TABLET | Freq: Every day | ORAL | Status: DC
  Administered 2015-11-25: 75 mg via ORAL
  Filled 2015-11-24: qty 1

## 2015-11-24 MED ORDER — WARFARIN SODIUM 5 MG PO TABS
2.5000 mg | ORAL_TABLET | Freq: Every day | ORAL | Status: DC
  Administered 2015-11-24 – 2015-11-25 (×2): 2.5 mg via ORAL
  Filled 2015-11-24 (×2): qty 1

## 2015-11-24 NOTE — Progress Notes (Signed)
Went into pt's room at [facility] at shift change and cortrac tube was out of pt's nare. Pt states he did not pull it out. Tube feed turned off and Dr Roxan Hockey paged to make aware. Waiting for call back from MD. Consuelo Pandy RN

## 2015-11-24 NOTE — Care Management Note (Addendum)
Case Management Note  Patient Details  Name: Mark Rivers MRN: [id number] Date of Birth: 26-Nov-1934  Subjective/Objective:    Patient transferred to 3S, hand off from previous NCM patient is for [facility], and Medical Director had suggested this a week ago.  Per Attending would like patient to go to [facility] ,hoping for a bed tomorrow.  Per [facility] rep, they are not sure they will have a bed or not tomorrow.  NCM spoke with patient and his daughter , Mark Rivers [id number]. She will be here tomorrow .   11/16- [place] BSN  - per Berneice Gandy with [facility] no bed available today.  Will hope for tomorrow.                  Action/Plan:   Expected Discharge Date:                  Expected Discharge Plan:  Long Term Acute Care (LTAC)  In-House Referral:     Discharge planning Services  CM Consult  Post Acute Care Choice:    Choice offered to:  Patient, Sibling  DME Arranged:    DME Agency:     HH Arranged:    [place] Agency:     Status of Service:  In process, will continue to follow  If discussed at Long Length of Stay Meetings, dates discussed:    Additional Comments:  Zenon Mayo, RN 11/24/2015, 5:04 PM

## 2015-11-24 NOTE — Progress Notes (Signed)
Patient complaining of chest pain, RN performed 12 lead EKG, administered 50 mcg fentanyl IV, notified MD. No new orders. RN will continue to monitor.

## 2015-11-24 NOTE — Clinical Social Work Note (Signed)
CSW continues to follow for discharge needs.  Danta Baumgardner, CSW [phone]  

## 2015-11-24 NOTE — Progress Notes (Signed)
Physical Therapy Treatment Patient Details Name: Mark Rivers MRN: [id number] DOB: 08/24/1934 Today's Date: 11/24/2015    History of Present Illness 81 yo with admission for chest pain, Afib with RVR s/p CABGx3 PMhx: CAD, CKD, and COPD     PT Comments    Patient progressing with mobility and pushing with encouragement to improve endurance.  Very SOB after first bout of ambulation with about 2 minute rest prior to beginning second bout with less SOB (pt self limited).  Will continue to benefit from skilled PT in the acute setting prior to d/c to SNF level rehab.   Follow Up Recommendations  SNF     Equipment Recommendations  Rolling walker with 5" wheels;3in1 (PT)    Recommendations for Other Services       Precautions / Restrictions Precautions Precautions: Fall;Sternal Precaution Comments: trach, flexiseal, panda    Mobility  Bed Mobility Overal bed mobility: Needs Assistance Bed Mobility: Rolling;Sidelying to Sit Rolling: Min assist Sidelying to sit: Min assist       General bed mobility comments: assist for initiating rolling with cues, assist for trunk upright  Transfers Overall transfer level: Needs assistance Equipment used: Rolling walker (2 wheeled) Transfers: Sit to/from Stand Sit to Stand: Mod assist;+2 physical assistance         General transfer comment: cues for hand placement  Ambulation/Gait Ambulation/Gait assistance: Mod assist;Max assist;+2 physical assistance;+2 safety/equipment Ambulation Distance (Feet): 10 Feet (and 8') Assistive device: Rolling walker (2 wheeled) Gait Pattern/deviations: Step-to pattern;Shuffle;Trunk flexed;Decreased stride length;Narrow base of support     General Gait Details: moving back farther in walker, cues and assist to step into walker, facilitation for hip extension to prevent flexion and improve positioning.  Tech assist with lines and RN to push chair,  Sat to rest, then participated in another ambulation  session with much encouragement,   Stairs            Wheelchair Mobility    Modified Rankin (Stroke Patients Only)       Balance   Sitting-balance support: Bilateral upper extremity supported Sitting balance-Leahy Scale: Poor Sitting balance - Comments: UE support for balance   Standing balance support: Bilateral upper extremity supported Standing balance-Leahy Scale: Poor Standing balance comment: UE support and assist for balance                    Cognition Arousal/Alertness: Awake/alert Behavior During Therapy: Anxious;Impulsive Overall Cognitive Status: Impaired/Different from baseline Area of Impairment: Following commands;Safety/judgement;Awareness;Problem solving   Current Attention Level: Sustained   Following Commands: Follows one step commands inconsistently     Problem Solving: Slow processing      Exercises      General Comments        Pertinent Vitals/Pain Faces Pain Scale: No hurt    Home Living                      Prior Function            PT Goals (current goals can now be found in the care plan section) Progress towards PT goals: Progressing toward goals    Frequency    Min 3X/week      PT Plan Current plan remains appropriate    Co-evaluation             End of Session Equipment Utilized During Treatment: Gait belt;Oxygen (35% trach collar) Activity Tolerance: Treatment limited secondary to agitation Patient left: in chair;with call bell/phone within reach  Time: [id number] PT Time Calculation (min) (ACUTE ONLY): 32 min  Charges:  $Gait Training: 8-22 mins $Therapeutic Activity: 8-22 mins                    G Codes:      Reginia Naas December 17, 2015, 2:40 PM  [place], [place] 12-17-2015

## 2015-11-24 NOTE — Progress Notes (Addendum)
      [address]       [place] [address]             [phone]        29 Days Post-Op Procedure(s) (LRB): CORONARY ARTERY BYPASS GRAFTING (CABG) x3(LIMA to LA, SVG to OM1, SVG to PDA) with EVH from the left thigh and partial lower leg greater saphenous vein and left internal mammary artery (N/A) TRANSESOPHAGEAL ECHOCARDIOGRAM (TEE) (N/A) INTRA-AORTIC BALLOON PUMP INSERTION size 40cc (Left)  Subjective: Patient with incisional (sternal pain) this am.  Objective: Vital signs in last 24 hours: Temp:  [97.6 F (36.4 C)-99.3 F (37.4 C)] 97.6 F (36.4 C) (11/15 0700) Pulse Rate:  [55-83] 74 (11/15 0700) Cardiac Rhythm: Atrial fibrillation (11/15 0811) Resp:  [21-30] 27 (11/15 0700) BP: (124-151)/(50-87) 151/69 (11/15 0700) SpO2:  [88 %-100 %] 88 % (11/15 0700) FiO2 (%):  [35 %-40 %] 40 % (11/15 0500) Weight:  [206 lb 5.6 oz (93.6 kg)] 206 lb 5.6 oz (93.6 kg) (11/15 0500)  Pre op weight 99.8 kg Current Weight  11/24/15 206 lb 5.6 oz (93.6 kg)    Intake/Output from previous day: 11/14 0701 - 11/15 0700 In: 1365 [NG/GT:1365] Out: 2725 [Urine:2725]   Physical Exam:  Cardiovascular:IRRR IRRR Pulmonary: Mostly clear this am Abdomen: Soft, non tender, bowel sounds present. Extremities: SCDs in place Wounds: Clean and dry.  No erythema or signs of infection.  Lab Results: CBC: Recent Labs  11/23/15 0410 11/24/15 0541  WBC 10.6* 11.1*  HGB 8.3* 8.7*  HCT 27.8* 29.7*  PLT 320 360   BMET:  Recent Labs  11/23/15 0410 11/24/15 0541  NA 143 143  K 3.8 4.4  CL 108 103  CO2 29 31  GLUCOSE 137* 152*  BUN 35* 32*  CREATININE 1.05 1.10  CALCIUM 8.2* 8.5*    PT/INR:  Lab Results  Component Value Date   INR 2.68 11/24/2015   INR 3.10 11/23/2015   INR 1.94 11/22/2015   ABG:  INR: Will add last result for INR, ABG once components are confirmed Will add last 4 CBG results once components are confirmed  Assessment/Plan:  1. CV - A fib  with controlled ventricular rate. On Cardizem 30 mg bid, Digoxin 0.125 mg daily, Lopressor 25 mg bid, and Coumadin.INR decreased from 3.10 to 2.68. Coumadin held last evening and will begin 2.5 this evening. 2.  Pulmonary - Acute hypoxic respiratory failure. On trach collar. 3. Volume Overload - On Lasix 40 mg daily 4.  Acute blood loss anemia - H and H stable at 8.7 and 29.7 5. GI-NPO. Continue feeding supplements and TFs 6. Severe deconditioning and malnutrition 7. DM-CBGs 184/141/144. On Insulin 8. Per Dr. Prescott Gum, increase Trazadone to 75 mg at hs (on 100 mg at hs prior to surgery).  ZIMMERMAN,DONIELLE MPA-C 11/24/2015,patient examined and medical record reviewed,agree with above note. Tharon Aquas Trigt III 11/24/2015

## 2015-11-24 NOTE — Progress Notes (Signed)
Speech Language Pathology Treatment: Dysphagia  Patient Details Name: Jewelz Isip MRN: [id number] DOB: 07-Mar-1934 Today's Date: 11/24/2015 Time: [id number] SLP Time Calculation (min) (ACUTE ONLY): 15 min  Assessment / Plan / Recommendation Clinical Impression  PMV placed for 15 minutes with no overt concern. He participated in therapeutic swallowing exercises of effortful swallowing and attempted masako maneuvers. Given Mod cues, pt achieved 15 effortful swallows across 3 trials of pureed solids. Pt continues to show improvement of secretion management and tolerance of PMV. Recommend proceeding with MBS to re-attempt starting PO diet given last instrumental test occurred 1 week ago. PMV may be worn intermittently with close supervision from staff. Will continue to follow acutely.   HPI HPI: 81 y.o.male presented with increasing dyspnea and a fib with RVR. Found to have 99% Lt main CAD and had emergent CABG 10/17. He remained intubated 10/17-10/26 until trach placed. PMH includes CAD, HTN, GERD, DM      SLP Plan  Continue with current plan of care     Recommendations  Diet recommendations: NPO Medication Administration: Via alternative means      Patient may use Passy-Muir Speech Valve: Intermittently with supervision;During all therapies with supervision PMSV Supervision: Intermittent (close supervision)         Oral Care Recommendations: Oral care QID Follow up Recommendations: LTACH Plan: Continue with current plan of care       [place], Student SLP  Shela Leff 11/24/2015, 4:10 PM

## 2015-11-24 NOTE — Discharge Summary (Signed)
Physician Discharge Summary  Patient ID: Mark Rivers MRN: [id number] DOB/AGE: June 18, 1934 81 y.o.  Admit date: 10/25/2015 Discharge date: 11/24/2015  Admission Diagnoses: Patient Active Problem List   Diagnosis Date Noted  . Pressure injury of skin 11/17/2015  . Surgery, elective   . Coronary artery disease 10/27/2015  . S/P CABG x 3   . Acute respiratory failure ([place])   . NSTEMI (non-ST elevated myocardial infarction) ([place])   . Atrial fibrillation with RVR ([place]) 10/25/2015  . Seasonal allergic conjunctivitis 08/25/2015  . History of tobacco abuse 08/25/2015  . Acute maxillary sinusitis 06/25/2015  . Diabetes mellitus ([place]) 03/11/2015  . Diabetes mellitus with stage 3 chronic kidney disease ([place]) 03/11/2015  . Metabolic bone disease 0000000  . Vitamin D deficiency 03/11/2015  . Atopic dermatitis 02/24/2015  . Hip pain 09/24/2014  . Need for immunization against influenza 09/24/2014  . Benign essential hypertension 03/13/2014  . Generalized osteoarthritis of multiple sites 03/13/2014  . CKD (chronic kidney disease) stage 3, GFR 30-59 ml/min 03/04/2014  . Acute stress disorder 11/12/2013  . Seasonal and perennial allergic rhinitis 11/12/2013  . Allergic urticaria 11/12/2013  . Benign neoplasm of colon 11/12/2013  . Causalgia of upper extremity 11/12/2013  . Cervical radiculopathy 11/12/2013  . Neck pain 11/12/2013  . Chronic fatigue 11/12/2013  . Dizziness 11/12/2013  . Hearing loss 11/12/2013  . Insomnia 11/12/2013  . Obesity 11/12/2013  . Premature ventricular contractions 11/12/2013  . Psychosexual dysfunction with inhibited sexual excitement 11/12/2013  . Hyperlipemia 09/11/2013  . Type 2 diabetes mellitus, uncontrolled ([place]) 09/11/2013  . Pulmonary emphysema ([place]) 04/22/2013  . SOB (shortness of breath) 03/12/2013  . Chronic coronary artery disease 03/12/2013  . Esophageal reflux   . Benign hypertension with chronic kidney disease, stage III   . Allergy   .  Neuropathy ([place])   . Back pain     Discharge Diagnoses:  Active Problems:   Atrial fibrillation with RVR (HCC)   NSTEMI (non-ST elevated myocardial infarction) (HCC)   Coronary artery disease   S/P CABG x 3   Acute respiratory failure (H[facility])   Surgery, elective   Pressure injury of skin   Discharged Condition: good  HPI:  Called urgently to cath lab to see patient. Admitted yesterday now being cath ed and found to have critical left main disease and mod rca disease. Patient is  81 yo with history of CAD, CKD, and COPD came to ER with chest pain, found to have atrial fibrillation with RVR. Patient had PTCA to RCA in 1988, no intervention since that time. Over the last 3 wks, he had developed exertional chest pressure, noticing it usually when he would take the dogs out in the evening (chest pressure, relieved with rest. He was seen in the cardiology  Office 10/20/2015 and given elevated creatinine,was to have a  Cardiolite rather than proceeding directly to cardiac cath. He was scheduled for Cardiolite but had not had it done yet.   Yesterday ,he developed severe substernal chest pressure at rest. This lasted for about an hour and he called EMS. He was noted to the in atrial fibrillation with RVR. He had not felt palpitations (just dyspnea and chest pain). In the ER, chest pain resolved after getting IV metoprolol. HR slowed from 130s to 90s but he remained in atrial fibrillation.   Hospital Course:  On 10/26/2015 Mark Rivers underwent an emergent coronary artery bypass grafting with Dr. Prescott Gum. He tolerated the procedure well and was transferred to the cardiac  ICU. An intra-aortic balloon pump was placed intraoperatively. CCM was consulted for medical assistance. He remained intubated POD 1. We removed the IABP and initated Lovenox for DVT prophylaxis. We continued to wean is FiO2 to work towards extubation/ He remained on pressors and IV Amio for rate control. On POD his  mediastinal chest tube was removed. He was given 2 units of blood for expected post-op anemia. Tube feedings were continued at this time. POD 5 remained intubated. Pleural tubes left in place. Empiric antibiotics continued. On POD 8 it was decided that a tracheostomy was indicated for prolonged weaning of ventilatory support. Right pleural effusion development on POD 9 therefore a chest tube was placed. We continued to diurese the patient and kept a close watch on the creatinine. There were times where the diuresis was held due to a rise in BUN and creatinine. Continued to make slow progress. Went back into rate controlled atrial fibrillation.  We initiated coumadin. INR slowly increasing. Vanco stopped after 14 days of therapy. Airwary secretions continued to improve. Remains in rate controlled atrail fibrillation/ Continue tube feeding, not ready for po diet. Due to severe deconditioning requires an LTAC facility for continued care. He is felt to be medically stable for admission into an LTAC facility. He is currently on TF's and Dysphagia 2 diet  Consults: CCM  Significant Diagnostic Studies:   NAMEEDRIC, BIERCE NO.:  [id number]  MEDICAL RECORD NO.:  [id number]  LOCATION:  2S03C                        FACILITY:  [facility]  PHYSICIAN:  Ivin Poot, M.D.  DATE OF BIRTH:  October 17, 1934  DATE OF PROCEDURE:  10/25/2015 DATE OF DISCHARGE:                              OPERATIVE REPORT   OPERATIONS: 1. Emergency coronary artery bypass grafting x3 (left internal mammary     artery to LAD, saphenous vein graft to OM2, saphenous vein graft to     posterior descending). 2. Placement of intra-aortic balloon pump via left femoral artery. 3. Endoscopic harvest of left leg greater saphenous vein.  SURGEON:  Ivin Poot, MD.  ASSISTANT:  Lars Pinks, PA-C  PREOPERATIVE DIAGNOSES: 1. Non ST elevation MI with unstable angina. 2. Critical 99% left main stenosis  with moderate LV dysfunction.  POSTOPERATIVE DIAGNOSES: 1. Non ST elevation MI with unstable angina. 2. Critical 99% left main stenosis with moderate LV dysfunction.   CLINICAL DATA:  Status post CABG on October 26, 2015. Respiratory failure, emphysema, tracheostomy patient.  EXAM: PORTABLE CHEST 1 VIEW  COMPARISON:  Portable chest x-ray of November 21, 2015  FINDINGS: The lungs are reasonably well inflated but the interstitial markings have increased bilaterally and confluent density at the lung bases is more conspicuous especially on the right. The cardiac silhouette is mildly enlarged. The central pulmonary vascularity is engorged. There is calcification in the wall of the aortic arch. The sternal wires are intact. The tracheostomy appliance tip projects 1 cm below the inferior margin of the clavicular heads. The feeding tube tip projects below the inferior margin of the image. The right-sided PICC line tip projects over the midportion of the SVC.  IMPRESSION: Worsening appearance of the lungs with increased pleural effusions especially on the right. Persistent left lower  lobe atelectasis or pneumonia. Cardiomegaly with mild central pulmonary vascular prominence.  Thoracic aortic atherosclerosis.  The support tubes are in stable position.   Electronically Signed   By: David  Martinique M.D.   On: 11/24/2015 08:36  Treatments: NAME:  HYRAM, KNOELL             ACCOUNT NO.:  [id number]  MEDICAL RECORD NO.:  [id number]  LOCATION:  2S03C                        FACILITY:  [facility]  PHYSICIAN:  Ivin Poot, M.D.  DATE OF BIRTH:  May 15, 1934  DATE OF PROCEDURE:  11/05/2015 DATE OF DISCHARGE:                              OPERATIVE REPORT   OPERATION:  Placement of right chest tube.  SURGEON:  Ivin Poot, M.D.  PREOPERATIVE DIAGNOSIS:  Right pleural effusion status post emergency coronary artery bypass grafting, ventilator  dependent.  POSTOPERATIVE DIAGNOSIS:  Right pleural effusion status post emergency coronary artery bypass grafting, ventilator dependent.   Discharge Exam: Blood pressure 119/63, pulse 65, temperature 98.1 F (36.7 C), temperature source Oral, resp. rate (!) 28, height 5\' 6"  (1.676 m), weight 206 lb 5.6 oz (93.6 kg), SpO2 92 %.    PE Fatigued, NAD lungs dim in bases R>L Cor [place]reg Abd- soft Ext, no edema Incis - healing well   Disposition: to LTAC ([facility])  Current Facility-Administered Medications:  .  0.9 %  sodium chloride infusion, , Intravenous, Continuous, Alaster Asfaw E Zaeda Mcferran, PA-C, Last Rate: 30 mL/hr at 11/26/15 0500 .  acetaminophen (TYLENOL) solution 650 mg, 650 mg, Per Tube, Q4H PRN, Ivin Poot, MD, 650 mg at 11/13/15 0959 .  acetaminophen (TYLENOL) tablet 650 mg, 650 mg, Oral, Q6H PRN, Ivin Poot, MD, 650 mg at 11/24/15 1824 .  aspirin chewable tablet 81 mg, 81 mg, Oral, Daily, Ivin Poot, MD, 81 mg at 11/26/15 1000 .  atorvastatin (LIPITOR) tablet 80 mg, 80 mg, Oral, q1800, Larey Dresser, MD, 80 mg at 11/25/15 1738 .  budesonide (PULMICORT) nebulizer solution 0.5 mg, 0.5 mg, Nebulization, BID, Jose Angelo A de Maxeys, MD, 0.5 mg at 11/26/15 0854 .  chlorhexidine (PERIDEX) 0.12 % solution 15 mL, 15 mL, Mouth Rinse, BID, Ivin Poot, MD, 15 mL at 11/26/15 0817 .  digoxin (LANOXIN) tablet 0.125 mg, 0.125 mg, Oral, Daily, Lyndee Leo, RPH, 0.125 mg at 11/26/15 0816 .  diltiazem (CARDIZEM) 10 mg/ml oral suspension 30 mg, 30 mg, Oral, Q12H, Ivin Poot, MD, 30 mg at 11/26/15 0817 .  diphenhydrAMINE (BENADRYL) 12.5 MG/5ML elixir 25 mg, 25 mg, Per Tube, QHS PRN, Melrose Nakayama, MD, 25 mg at 11/22/15 2131 .  famotidine (PEPCID) 40 MG/5ML suspension 20 mg, 20 mg, Per Tube, Daily, Wilhelmina Mcardle, MD, 20 mg at 11/26/15 0817 .  feeding supplement (JEVITY 1.2 CAL) liquid 1,000 mL, 1,000 mL, Per Tube, Continuous, Ivin Poot, MD, Last Rate: 65 mL/hr at  11/26/15 0700, 1,000 mL at 11/26/15 0700 .  feeding supplement (PRO-STAT SUGAR FREE 64) liquid 30 mL, 30 mL, Per Tube, BID, Ivin Poot, MD, 30 mL at 11/26/15 0816 .  furosemide (LASIX) tablet 40 mg, 40 mg, Oral, Daily, Ivin Poot, MD, 40 mg at 11/26/15 0816 .  Gerhardt's butt cream, , Topical, PRN, Grace Isaac, MD, 1 application  at 11/22/15 0332 .  CBG monitoring, , , Q4H **AND** insulin aspart (novoLOG) injection 0-24 Units, 0-24 Units, Subcutaneous, Q4H, Ivin Poot, MD, 4 Units at 11/26/15 [phone]  levalbuterol (XOPENEX) nebulizer solution 0.63 mg, 0.63 mg, Nebulization, Q6H PRN, Ivin Poot, MD, 0.63 mg at 11/24/15 0043 .  levalbuterol (XOPENEX) nebulizer solution 0.63 mg, 0.63 mg, Nebulization, TID, Ivin Poot, MD, 0.63 mg at 11/26/15 0854 .  loperamide (IMODIUM) 1 MG/5ML solution 4 mg, 4 mg, Oral, Q6H PRN, [name], MD, 4 mg at 11/23/15 [id number] .  MEDLINE mouth rinse, 15 mL, Mouth Rinse, q12n4p, Ivin Poot, MD .  metoprolol [facility]) injection 2.5-5 mg, 2.5-5 mg, Intravenous, Q2H PRN, Nani Skillern, PA-C, 5 mg at 11/25/15 0647 .  metoprolol tartrate (LOPRESSOR) 25 mg/10 mL oral suspension 25 mg, 25 mg, Per Tube, BID, 25 mg at 11/26/15 0817 **OR** metoprolol tartrate (LOPRESSOR) tablet 25 mg, 25 mg, Oral, BID, Melrose Nakayama, MD .  ondansetron [facility]) injection 4 mg, 4 mg, Intravenous, Q6H PRN, Nani Skillern, PA-C, 4 mg at 11/12/15 1202 .  potassium chloride 20 MEQ/15ML (10%) solution 20 mEq, 20 mEq, Oral, Daily, Ivin Poot, MD, 20 mEq at 11/26/15 0817 .  QUEtiapine (SEROQUEL) tablet 50 mg, 50 mg, Oral, BID, Rush Farmer, MD, 50 mg at 11/26/15 1000 .  RESOURCE THICKENUP CLEAR, , Oral, PRN, Ivin Poot, MD .  sodium chloride flush (NS) 0.9 % injection 10-40 mL, 10-40 mL, Intracatheter, Q12H, Ivin Poot, MD, 10 mL at 11/25/15 2145 .  sodium chloride flush (NS) 0.9 % injection 10-40 mL, 10-40 mL, Intracatheter, PRN,  Ivin Poot, MD, 40 mL at 11/24/15 0620 .  traZODone (DESYREL) tablet 75 mg, 75 mg, Oral, QHS, Donielle Liston Alba, PA-C, 75 mg at 11/25/15 2137 .  Warfarin - Physician Dosing Inpatient, , Does not apply, q1800, Ivin Poot, MD Follow-up Information    Tharon Aquas Trigt III, MD Follow up.   Specialty:  Cardiothoracic Surgery Why:  Follow-up appointment on 12/22/2015 at 1:00pm. Please arrive at 12:30am at [facility] located on the first floor of our building for a chest xray.  Contact information: [address] [place] [address] [phone]        [name]., MD. Call in 1 day(s).   Specialty:  Family Medicine Contact information: [id number] [place]  [address] [phone]          The patient has been discharged on:   1.Beta Blocker:  Yes [ x  ]                              No   [   ]                              If No, reason:  2.Ace Inhibitor/ARB: Yes [   ]                                     No  [ x  ]                                     If No,  reason: titration of BB  3.Statin:   Yes [  x ]                  No  [   ]                  If No, reason:  4.Ecasa:  Yes  [   ]                  No   [ x  ]                  If No, reason: Coumadin   Signed: Elgie Collard 11/24/2015, 1:35 PM

## 2015-11-24 NOTE — Progress Notes (Signed)
Paged MD to notify about cortrac being pulled out by pt, notified by PACU RN that MD is in OR at this time.  Will page again later. Consuelo Pandy RN

## 2015-11-24 NOTE — Progress Notes (Signed)
PULMONARY / CRITICAL CARE MEDICINE   Name: Mark Rivers MRN: [id number] DOB: 05-08-1934    ADMISSION DATE:  10/25/2015 CONSULTATION DATE:10/27/15  Referring provider: Dr. Prescott Gum  [id number] of breath  Brief history 81 y.o.male presented with increasing dyspnea and a fib with RVR. Found to have 99% Lt main CAD and had emergent CABG 10/17.  SUBJECTIVE:  Frustrated today. No distress.   VITAL SIGNS: BP (!) 151/69 (BP Location: Left Arm)   Pulse 74   Temp 97.6 F (36.4 C) (Oral)   Resp (!) 27   Ht 5\' 6"  (1.676 m)   Wt 206 lb 5.6 oz (93.6 kg)   SpO2 (!) 88%   BMI 33.31 kg/m   HEMODYNAMICS:    VENTILATOR SETTINGS: FiO2 (%):  [35 %-40 %] 40 %  INTAKE / OUTPUT: I/O last 3 completed shifts: In: 2235 [NG/GT:2235] Out: 3500 [Urine:3500]  PHYSICAL EXAMINATION: General:  NAD, in chair Neuro:  Awake, alert, responds appropriately, moves all ext HEENT:  PEERL, [place]/AT, trach w/ thin yellow secretions  Cardiovascular:  s1 s2 RRR Lungs: Coarse BS that clears with coughing. Abdomen:  Soft, nontender, nondistended, bowel sounds present Musculoskeletal:  Moves all 4 extremities Skin:  Warm, dry, intact, excoriations unchanged  LABS:  BMET  Recent Labs Lab 11/22/15 0330 11/23/15 0410 11/24/15 0541  NA 142 143 143  K 4.0 3.8 4.4  CL 106 108 103  CO2 30 29 31   BUN 38* 35* 32*  CREATININE 1.01 1.05 1.10  GLUCOSE 112* 137* 152*    Electrolytes  Recent Labs Lab 11/22/15 0330 11/23/15 0410 11/24/15 0541  CALCIUM 8.5* 8.2* 8.5*  MG 2.0 2.0 2.2  PHOS  --  3.3 3.3    CBC  Recent Labs Lab 11/22/15 0330 11/23/15 0410 11/24/15 0541  WBC 10.3 10.6* 11.1*  HGB 8.6* 8.3* 8.7*  HCT 28.4* 27.8* 29.7*  PLT 372 320 360   Coag's  Recent Labs Lab 11/22/15 0330 11/23/15 0410 11/24/15 0541  INR 1.94 3.10 2.68   Sepsis Markers No results for input(s): LATICACIDVEN, PROCALCITON, O2SATVEN in the last 168 hours.  ABG No results for input(s):  PHART, PCO2ART, PO2ART in the last 168 hours.  Liver Enzymes  Recent Labs Lab 11/18/15 0405  AST 55*  ALT 62  ALKPHOS 76  BILITOT 0.7  ALBUMIN 1.9*   Cardiac Enzymes No results for input(s): TROPONINI, PROBNP in the last 168 hours.  Glucose  Recent Labs Lab 11/23/15 1147 11/23/15 1724 11/23/15 1954 11/23/15 2255 11/24/15 0355 11/24/15 0754  GLUCAP 172* 103* 155* 184* 141* 144*   Imaging Dg Chest Port 1 View  Result Date: 11/24/2015 CLINICAL DATA:  Status post CABG on October 26, 2015. Respiratory failure, emphysema, tracheostomy patient. EXAM: PORTABLE CHEST 1 VIEW COMPARISON:  Portable chest x-ray of November 21, 2015 FINDINGS: The lungs are reasonably well inflated but the interstitial markings have increased bilaterally and confluent density at the lung bases is more conspicuous especially on the right. The cardiac silhouette is mildly enlarged. The central pulmonary vascularity is engorged. There is calcification in the wall of the aortic arch. The sternal wires are intact. The tracheostomy appliance tip projects 1 cm below the inferior margin of the clavicular heads. The feeding tube tip projects below the inferior margin of the image. The right-sided PICC line tip projects over the midportion of the SVC. IMPRESSION: Worsening appearance of the lungs with increased pleural effusions especially on the right. Persistent left lower lobe atelectasis or pneumonia. Cardiomegaly with mild central pulmonary  vascular prominence. Thoracic aortic atherosclerosis. The support tubes are in stable position. Electronically Signed   By: David  Martinique M.D.   On: 11/24/2015 08:36     Studies: Chest [place] 10/26: moderate effusion rt with significant compressive atx, small eff to mod left CT Chest/Abd/Pelvis W/O 10/30: IMPRESSION: 1. Probable congestive heart failure. Patchy consolidation in both lower lobes, left greater than right, worrisome for superimposed pneumonia. 2. Aortic  atherosclerosis (ICD10-170.0). 3. Enlarged subcarinal lymph node, likely reactive. 4. Asbestos related pleural disease. TTE 10/31:LV with normal size &EF 50-55%. Normal wall motion. Insufficient to assess diastolic function. LA & RA normal in size. RV normal in size and function. No aortic stenosis with trivial regurgitation. Mild mitral regurgitation without stenosis. Possible Mark Rivers is linear projection originating from mitral annular calcification within the left atrium noted. No pulmonic stenosis. No tricuspid regurgitation. No pericardial effusion.  Microbiology: MRSA PCR 10/18: Negative  Tracheal Asp Ctx 10/18: Oral Flora Blood Ctx x2 10/18: Negative Tracheal Asp Ctx 10/19: Oral Flora Tracheal Asp Ctx 10/24: Oral Flora Blood Ctx 10/24: 1/2 Bottles MRSE Blood Ctx 10/26: 1/2 Bottles Coag Neg Staph  C diff 10/28: Negative  Right Pleural Effusion 10/27: Negative  Tracheal Asp Ctx 10/30: Normal respiratory flora, final Blood Ctx x2 10/31 >>no growth, final Tracheal aspirate 11/4>>>normal resp flora, final Urine 11/4 >>>no growth, final Blood clx x2 11/4 >> no growth, still pending  Antibiotics: Levaquin 10/17 - 10/24 Aztreonam 10/24 - 10/29 Vancomycin 10/17 - 10/18; 10/25 >>> Fluconazole 11/6 >>> stop date 11/9  Significant Events: 10/17 - Admit, cardiac cath, emergent CABG due to NSTEMI. 10/18 - 0600 PCCM consulted for refractory hypoxia 10/25 - reintubated for increased WOB and hypoxia.  10/26 - trached (DF)  Lines/Tubes: OETT 10/17 - 10/26 L Rad Art Line 10/17 - 10/26 R IJ PA Cat 10/17 - out CT x3 10/17 - last removed 10/29 Trach (DF) 10/26 >> RUE DL PICC 10/30 >> R NGT 10/26 >> PIV x1   DISCUSSION: 81 y.o.male presented with increasing dyspnea and a fib with RVR. Found to have 99% Lt main CAD and had emergent CABG 10/17. Course c/b MRSE bacteremia, delirium and deconditioning.  He has completed 14 d vanc. The delirium seems better. Still  deconditioned. Will continue slow rehab efforts. Suspect that this will be a prolonged course   Assessment/Plan:  Acute Hypoxic Respiratory Failure: S/P Trach for vocal cord edema &failure to wean. Right >Left atelectasis/effusion. Severe deconditioning and protein calorie malnutrition. - Cont ATC he has secretions that intermittent cause hypoxia that he clears well - To chair as able. - PT evaluation - PMV as tolerated - Cuff down - Keep dry as able - SLP working on PMV and swallowing.   Acute Encephalopathy/Delirium- improving:  - Seroquel 50 mg bid - trazodone increased to 75mg  at HS  NSTEMI: S/P CABG on 10/17.  Atrial Fibrillation w/ RVR:  - Chronic Diastolic Congestive Heart Failure: Grade 2 dysfunction on 10/24 echocardiogram - Lasix 40 mg PO daily - Continuing ASA 325mg , Lipitor 80mg , &Lopressor bid.   ARF resolving P:  - Lasix 40 mg PO daily. - Chem in am  - Replace electrolytes as indicated.  Anemia: - Limit phlebotomy, no cbc needed in am   Diarrhea: Cortrak pulled out but stil in stomach, no issues with ileus. C diff negative 10/28. No evidence of worsening P: - Added immodium. - Continue TF. - SLP failed, will repeat towards the end of the week.  Diabetes Mellitus Type [id number] controlled.  -  Continuing SSI per algorithm with accu-checks q4hr.  H/O Moderate COPD:No signs of exacerbation. - Continuing Xopenex nebs tid &Pulmicort neb bid.  H/O GERD: - Continuing Pepcid VT daily.  - Repeat SLP - cont TFs for now   We will see again on Monday re: trach    Erick Colace ACNP-BC [place] Pager # [phone] OR # [phone] if no answer   Attending Note:  I have examined patient, reviewed labs, studies and notes. I have discussed the case with Jerrye Bushy, and I agree with the data and plans as amended above. Now able to tolerate ATC ad lib. Work to continue to mobilize, Metallurgist. We will  follow for trach care, PMV, swallowing - long term goal will be decannulation. Has a lot of strength to build before he will be ready for this.   Baltazar Apo, MD, PhD 11/24/2015, 4:09 PM [facility] [phone] or if no answer [phone]

## 2015-11-25 ENCOUNTER — Inpatient Hospital Stay (HOSPITAL_COMMUNITY): Payer: Medicare Other

## 2015-11-25 LAB — GLUCOSE, CAPILLARY
Glucose-Capillary: 110 mg/dL — ABNORMAL HIGH (ref 65–99)
Glucose-Capillary: 109 mg/dL — ABNORMAL HIGH (ref 65–99)
Glucose-Capillary: 95 mg/dL (ref 65–99)
Glucose-Capillary: 131 mg/dL — ABNORMAL HIGH (ref 65–99)
Glucose-Capillary: 104 mg/dL — ABNORMAL HIGH (ref 65–99)
Glucose-Capillary: 188 mg/dL — ABNORMAL HIGH (ref 65–99)
Glucose-Capillary: 147 mg/dL — ABNORMAL HIGH (ref 65–99)

## 2015-11-25 LAB — PROTIME-INR
INR: 2.39
Prothrombin Time: 26.5 seconds — ABNORMAL HIGH (ref 11.4–15.2)

## 2015-11-25 LAB — MAGNESIUM: Magnesium: 1.9 mg/dL (ref 1.7–2.4)

## 2015-11-25 MED ORDER — IOPAMIDOL (ISOVUE-300) INJECTION 61%
INTRAVENOUS | Status: AC
  Administered 2015-11-25: 15 mL
  Filled 2015-11-25: qty 50

## 2015-11-25 MED ORDER — LIDOCAINE VISCOUS 2 % MT SOLN
15.0000 mL | Freq: Once | OROMUCOSAL | Status: AC
  Administered 2015-11-25: 5 mL via OROMUCOSAL
  Filled 2015-11-25: qty 15

## 2015-11-25 MED ORDER — COUMADIN BOOK
Freq: Once | Status: AC
  Administered 2015-11-25: 21:00:00
  Filled 2015-11-25: qty 1

## 2015-11-25 MED ORDER — CHLORHEXIDINE GLUCONATE 0.12 % MT SOLN
15.0000 mL | Freq: Two times a day (BID) | OROMUCOSAL | Status: DC
Start: 2015-11-25 — End: 2015-11-26
  Administered 2015-11-26: 15 mL via OROMUCOSAL
  Filled 2015-11-25: qty 15

## 2015-11-25 MED ORDER — LIDOCAINE VISCOUS 2 % MT SOLN
OROMUCOSAL | Status: AC
  Administered 2015-11-25: 5 mL via OROMUCOSAL
  Filled 2015-11-25: qty 15

## 2015-11-25 MED ORDER — IOPAMIDOL (ISOVUE-300) INJECTION 61%
50.0000 mL | Freq: Once | INTRAVENOUS | Status: AC | PRN
  Administered 2015-11-25: 15 mL

## 2015-11-25 MED ORDER — SODIUM CHLORIDE 0.9 % IV SOLN
INTRAVENOUS | Status: DC
  Administered 2015-11-25: 09:00:00 via INTRAVENOUS

## 2015-11-25 MED ORDER — ALTEPLASE 2 MG IJ SOLR
2.0000 mg | Freq: Once | INTRAMUSCULAR | Status: AC
Start: 2015-11-25 — End: 2015-11-26
  Administered 2015-11-26: 2 mg
  Filled 2015-11-25: qty 2

## 2015-11-25 MED ORDER — ORAL CARE MOUTH RINSE
15.0000 mL | Freq: Two times a day (BID) | OROMUCOSAL | Status: DC
  Administered 2015-11-26 (×2): 15 mL via OROMUCOSAL

## 2015-11-25 MED ORDER — RESOURCE THICKENUP CLEAR PO POWD
ORAL | Status: DC | PRN
  Filled 2015-11-25: qty 125

## 2015-11-25 NOTE — Care Management Important Message (Signed)
Important Message  Patient Details  Name: Mark Rivers MRN: [id number] Date of Birth: 1934-05-13   Medicare Important Message Given:  Yes    Yuriana Gaal Abena 11/25/2015, 9:46 AM

## 2015-11-25 NOTE — Progress Notes (Signed)
Patient passed swallow evaluation but after speaking with [facility] in the [place] he would like patient to continue to get the NGT for nutrition and medications. He also gave the order to take out foley. NT will remove foley and replace with condom catheter.

## 2015-11-25 NOTE — Progress Notes (Addendum)
[address]       [employer] [address]             [phone]      30 Days Post-Op Procedure(s) (LRB): CORONARY ARTERY BYPASS GRAFTING (CABG) x3(LIMA to LA, SVG to OM1, SVG to PDA) with EVH from the left thigh and partial lower leg greater saphenous vein and left internal mammary artery (N/A) TRANSESOPHAGEAL ECHOCARDIOGRAM (TEE) (N/A) INTRA-AORTIC BALLOON PUMP INSERTION size 40cc (Left) Subjective: Cortrak was dislodged - team apparently not here to replace  Objective: Vital signs in last 24 hours: Temp:  [97.7 F (36.5 C)-98.4 F (36.9 C)] 98.1 F (36.7 C) (11/16 0345) Pulse Rate:  [59-124] 78 (11/16 0730) Cardiac Rhythm: Atrial fibrillation (11/16 0747) Resp:  [21-33] 26 (11/16 0730) BP: (119-165)/(55-95) 157/62 (11/16 0730) SpO2:  [92 %-98 %] 98 % (11/16 0730) FiO2 (%):  [35 %-40 %] 35 % (11/16 0727) Weight:  [198 lb 6.6 oz (90 kg)] 198 lb 6.6 oz (90 kg) (11/16 0500)  Hemodynamic parameters for last 24 hours:    Intake/Output from previous day: 11/15 0701 - 11/16 0700 In: 30 [I.V.:30] Out: 1500 [Urine:1500] Intake/Output this shift: No intake/output data recorded.  General appearance: alert, cooperative and no distress Heart: irregularly irregular rhythm Lungs: dim in lower fields Abdomen: benign Extremities: min edema Wound: incis healing well  Lab Results:  Recent Labs  11/23/15 0410 11/24/15 0541  WBC 10.6* 11.1*  HGB 8.3* 8.7*  HCT 27.8* 29.7*  PLT 320 360   BMET:  Recent Labs  11/23/15 0410 11/24/15 0541  NA 143 143  K 3.8 4.4  CL 108 103  CO2 29 31  GLUCOSE 137* 152*  BUN 35* 32*  CREATININE 1.05 1.10  CALCIUM 8.2* 8.5*    PT/INR:  Recent Labs  11/25/15 0350  LABPROT 26.5*  INR 2.39   ABG    Component Value Date/Time   PHART 7.381 11/04/2015 0330   HCO3 30.7 (H) 11/04/2015 0330   TCO2 31 11/05/2015 1803   ACIDBASEDEF 1.0 10/29/2015 1531   O2SAT 91.3 11/04/2015 0330   CBG (last 3)   Recent Labs  11/24/15 2032 11/24/15 2357 11/25/15 0338  GLUCAP 115* 117* 110*    Meds Scheduled Meds: . atorvastatin  80 mg Oral q1800  . budesonide (PULMICORT) nebulizer solution  0.5 mg Nebulization BID  . chlorhexidine gluconate (MEDLINE KIT)  15 mL Mouth Rinse BID  . digoxin  0.125 mg Oral Daily  . diltiazem  30 mg Oral Q12H  . famotidine  20 mg Per Tube Daily  . feeding supplement (PRO-STAT SUGAR FREE 64)  30 mL Per Tube BID  . furosemide  40 mg Oral Daily  . insulin aspart  0-24 Units Subcutaneous Q4H  . levalbuterol  0.63 mg Nebulization TID  . mouth rinse  15 mL Mouth Rinse Q4H while awake  . metoprolol tartrate  25 mg Per Tube BID   Or  . metoprolol tartrate  25 mg Oral BID  . potassium chloride  20 mEq Oral Daily  . QUEtiapine  50 mg Oral BID  . sodium chloride flush  10-40 mL Intracatheter Q12H  . traZODone  75 mg Oral QHS  . warfarin  2.5 mg Oral q1800  . Warfarin - Physician Dosing Inpatient   Does not apply q1800   Continuous Infusions: . feeding supplement (JEVITY 1.2 CAL) Stopped (11/24/15 1935)   PRN Meds:.acetaminophen (TYLENOL) oral liquid 160 mg/5 mL, acetaminophen, diphenhydrAMINE, Gerhardt's butt cream,  levalbuterol, loperamide, metoprolol, ondansetron (ZOFRAN) IV, sodium chloride flush  Xrays Dg Chest Port 1 View  Result Date: 11/24/2015 CLINICAL DATA:  Status post CABG on October 26, 2015. Respiratory failure, emphysema, tracheostomy patient. EXAM: PORTABLE CHEST 1 VIEW COMPARISON:  Portable chest x-ray of November 21, 2015 FINDINGS: The lungs are reasonably well inflated but the interstitial markings have increased bilaterally and confluent density at the lung bases is more conspicuous especially on the right. The cardiac silhouette is mildly enlarged. The central pulmonary vascularity is engorged. There is calcification in the wall of the aortic arch. The sternal wires are intact. The tracheostomy appliance tip projects 1 cm below the inferior margin of the  clavicular heads. The feeding tube tip projects below the inferior margin of the image. The right-sided PICC line tip projects over the midportion of the SVC. IMPRESSION: Worsening appearance of the lungs with increased pleural effusions especially on the right. Persistent left lower lobe atelectasis or pneumonia. Cardiomegaly with mild central pulmonary vascular prominence. Thoracic aortic atherosclerosis. The support tubes are in stable position. Electronically Signed   By: David  Martinique M.D.   On: 11/24/2015 08:36    Assessment/Plan: S/P Procedure(s) (LRB): CORONARY ARTERY BYPASS GRAFTING (CABG) x3(LIMA to LA, SVG to OM1, SVG to PDA) with EVH from the left thigh and partial lower leg greater saphenous vein and left internal mammary artery (N/A) TRANSESOPHAGEAL ECHOCARDIOGRAM (TEE) (N/A) INTRA-AORTIC BALLOON PUMP INSERTION size 40cc (Left)  1 will need tube replacement - will get IR to place new feeding tube 2 afib stable on meds, cont coumadin 3 glucose adeq controlled 4 poss LTAC soon   LOS: 31 days    GOLD,Mark E 11/25/2015 Ready to transfer to [facility]

## 2015-11-25 NOTE — Progress Notes (Signed)
Per protocol, paged Dr. Roxan Hockey on-call for Dr. Prescott Gum regarding patient's Cortrak becoming dislodged earlier in shift and potential need to convert per tube medications for heart rate/rhythm control to IV forms (previous RN unable to speak with MD due to him being in OR earlier in shift). No orders received, per MD Cortrak to be replaced by team on day shift. Will continue to monitor.

## 2015-11-25 NOTE — Progress Notes (Signed)
Modified Barium Swallow Progress Note  Patient Details  Name: Mark Rivers MRN: [id number] Date of Birth: [birth year]  Today's Date: 11/25/2015  Modified Barium Swallow completed.  Full report located under Chart Review in the Imaging Section.  Brief recommendations include the following:  Clinical Impression  Pt presents with mild oral and pharyngeal dysphagia. He showed lingual pumping with pureed and solid consistencies. All PO intake displayed a delay in swallow initiation to the valleculae, with silent aspiration occuring on large thin liquid boluses. Aspirates were eventually cleared, but several additional cues for coughing and throat clearing was needed. Airway remained protected given nectar thick liquids. Mild vallecular residue seen following liquid and pureed trials, but this cleared when given regular solid bolus. Barium pill was adminsitered with nectar thick liquid and no difficulty was observed. Recommend Dys 2 and nectar thick liquids with PMV worn for all PO intake. Solids will likely advance at bedside. Will continue to follow acutely.   Swallow Evaluation Recommendations       SLP Diet Recommendations: Dysphagia 2 (Fine chop) solids;Nectar thick liquid   Liquid Administration via: Cup;No straw   Medication Administration: Whole meds with liquid   Supervision: Patient able to self feed;Full supervision/cueing for compensatory strategies   Compensations: Minimize environmental distractions;Slow rate;Small sips/bites   Postural Changes: Seated upright at 90 degrees;Remain semi-upright after after feeds/meals (Comment)   Oral Care Recommendations: Oral care BID   Other Recommendations: Order thickener from pharmacy;Prohibited food (jello, ice cream, thin soups);Remove water pitcher   [name], Student SLP  Shela Leff 11/25/2015,10:52 AM

## 2015-11-26 ENCOUNTER — Other Ambulatory Visit (HOSPITAL_COMMUNITY): Payer: Medicare Other

## 2015-11-26 ENCOUNTER — Inpatient Hospital Stay (HOSPITAL_COMMUNITY): Payer: Medicare Other

## 2015-11-26 ENCOUNTER — Inpatient Hospital Stay
Admission: AD | Admit: 2015-11-26 | Discharge: 2015-12-15 | Disposition: A | Discharge status: Home Health | Payer: Medicare Other | Source: Ambulatory Visit | Attending: Internal Medicine | Admitting: Internal Medicine

## 2015-11-26 DIAGNOSIS — D72829 Elevated white blood cell count, unspecified: Secondary | ICD-10-CM

## 2015-11-26 DIAGNOSIS — R52 Pain, unspecified: Secondary | ICD-10-CM

## 2015-11-26 DIAGNOSIS — Z4659 Encounter for fitting and adjustment of other gastrointestinal appliance and device: Secondary | ICD-10-CM

## 2015-11-26 DIAGNOSIS — J9690 Respiratory failure, unspecified, unspecified whether with hypoxia or hypercapnia: Secondary | ICD-10-CM

## 2015-11-26 LAB — URINALYSIS, ROUTINE W REFLEX MICROSCOPIC
Bilirubin Urine: NEGATIVE
Glucose, UA: NEGATIVE mg/dL
HGB URINE DIPSTICK: NEGATIVE
Ketones, ur: NEGATIVE mg/dL
Leukocytes, UA: NEGATIVE
Nitrite: NEGATIVE
Protein, ur: NEGATIVE mg/dL
SPECIFIC GRAVITY, URINE: 1.015 (ref 1.005–1.030)
pH: 6.5 (ref 5.0–8.0)

## 2015-11-26 LAB — GLUCOSE, CAPILLARY
Glucose-Capillary: 136 mg/dL — ABNORMAL HIGH (ref 65–99)
Glucose-Capillary: 164 mg/dL — ABNORMAL HIGH (ref 65–99)
Glucose-Capillary: 97 mg/dL (ref 65–99)
Glucose-Capillary: 155 mg/dL — ABNORMAL HIGH (ref 65–99)

## 2015-11-26 LAB — PROTIME-INR
INR: 3.26
Prothrombin Time: 34 seconds — ABNORMAL HIGH (ref 11.4–15.2)

## 2015-11-26 LAB — MAGNESIUM: MAGNESIUM: 2 mg/dL (ref 1.7–2.4)

## 2015-11-26 MED ORDER — WARFARIN SODIUM 1 MG PO TABS: 1.0000 mg | ORAL_TABLET | Freq: Every day | ORAL | Status: DC

## 2015-11-26 MED ORDER — ASPIRIN 81 MG PO CHEW
81.0000 mg | CHEWABLE_TABLET | Freq: Every day | ORAL | Status: DC
  Administered 2015-11-26: 81 mg via ORAL
  Filled 2015-11-26: qty 1

## 2015-11-26 NOTE — Progress Notes (Addendum)
[address]       [place] [address]             [phone]      31 Days Post-Op Procedure(s) (LRB): CORONARY ARTERY BYPASS GRAFTING (CABG) x3(LIMA to LA, SVG to OM1, SVG to PDA) with EVH from the left thigh and partial lower leg greater saphenous vein and left internal mammary artery (N/A) TRANSESOPHAGEAL ECHOCARDIOGRAM (TEE) (N/A) INTRA-AORTIC BALLOON PUMP INSERTION size 40cc (Left) Subjective: Had some increased SOB this am , feeling better after lasix/breathing Tx Does have urinary retention- foley placed  Objective: Vital signs in last 24 hours: Temp:  [97.7 F (36.5 C)-99.3 F (37.4 C)] 99.3 F (37.4 C) (11/17 0415) Pulse Rate:  [69-91] 84 (11/17 0842) Cardiac Rhythm: Atrial fibrillation (11/17 0700) Resp:  [21-31] 23 (11/17 0842) BP: (120-169)/(52-81) 153/69 (11/17 0415) SpO2:  [88 %-93 %] 90 % (11/17 0842) FiO2 (%):  [30 %-40 %] 40 % (11/17 0842) Weight:  [197 lb 1.5 oz (89.4 kg)] 197 lb 1.5 oz (89.4 kg) (11/17 0441)  Hemodynamic parameters for last 24 hours:    Intake/Output from previous day: 11/16 0701 - 11/17 0700 In: 2598.2 [I.V.:612; NG/GT:1986.2] Out: 1450 [Urine:1450] Intake/Output this shift: No intake/output data recorded.   PE Fatigued, NAD lungs dim in bases R>L Cor Irreg Abd- soft Ext, no edema Incis - healing well  Lab Results:  Recent Labs  11/24/15 0541  WBC 11.1*  HGB 8.7*  HCT 29.7*  PLT 360   BMET:  Recent Labs  11/24/15 0541  NA 143  K 4.4  CL 103  CO2 31  GLUCOSE 152*  BUN 32*  CREATININE 1.10  CALCIUM 8.5*    PT/INR:  Recent Labs  11/26/15 0420  LABPROT 34.0*  INR 3.26   ABG    Component Value Date/Time   PHART 7.381 11/04/2015 0330   HCO3 30.7 (H) 11/04/2015 0330   TCO2 31 11/05/2015 1803   ACIDBASEDEF 1.0 10/29/2015 1531   O2SAT 91.3 11/04/2015 0330   CBG (last 3)   Recent Labs  11/25/15 2323 11/26/15 0417 11/26/15 0817  GLUCAP 147* 136* 164*    Meds Scheduled  Meds: . aspirin  81 mg Oral Daily  . atorvastatin  80 mg Oral q1800  . budesonide (PULMICORT) nebulizer solution  0.5 mg Nebulization BID  . chlorhexidine  15 mL Mouth Rinse BID  . digoxin  0.125 mg Oral Daily  . diltiazem  30 mg Oral Q12H  . famotidine  20 mg Per Tube Daily  . feeding supplement (PRO-STAT SUGAR FREE 64)  30 mL Per Tube BID  . furosemide  40 mg Oral Daily  . insulin aspart  0-24 Units Subcutaneous Q4H  . levalbuterol  0.63 mg Nebulization TID  . mouth rinse  15 mL Mouth Rinse q12n4p  . metoprolol tartrate  25 mg Per Tube BID   Or  . metoprolol tartrate  25 mg Oral BID  . potassium chloride  20 mEq Oral Daily  . QUEtiapine  50 mg Oral BID  . sodium chloride flush  10-40 mL Intracatheter Q12H  . traZODone  75 mg Oral QHS  . [START ON 11/27/2015] warfarin  1 mg Oral q1800  . Warfarin - Physician Dosing Inpatient   Does not apply q1800   Continuous Infusions: . sodium chloride 30 mL/hr at 11/26/15 0500  . feeding supplement (JEVITY 1.2 CAL) 1,000 mL (11/26/15 0700)   PRN Meds:.acetaminophen (TYLENOL) oral liquid 160 mg/5 mL,  acetaminophen, diphenhydrAMINE, Gerhardt's butt cream, levalbuterol, loperamide, metoprolol, ondansetron (ZOFRAN) IV, RESOURCE THICKENUP CLEAR, sodium chloride flush  Xrays Dg Abd 1 View  Result Date: 11/25/2015 INDICATION: Malnutition EXAM: ABDOMEN - 1 VIEW COMPARISON:  None. CONTRAST:  15 cc of Isovue 300 FLUOROSCOPY TIME:  3 minutes. 36 seconds. COMPLICATIONS: None immediate PROCEDURE: The Dobhoff tube was placed by technologist under fluoroscopy guidance. Under intermittent fluoroscopic guidance, the Dobhoff tube was advanced through the stomach, through the duodenum with tipultimately terminating over the expected location of the duodenal - jejunal junction. A spot fluoroscopic image was saved for documentation purposes. FINDINGS: As above. IMPRESSION: Successful fluoroscopic guided placement of Dobhoff tube with tip terminating over the  duodenojejunal junction. The tube is ready for immediate use. Electronically Signed   By: Kerby Moors M.D.   On: 11/25/2015 12:13   Dg Swallowing Func-speech Pathology  Result Date: 11/25/2015 Objective Swallowing Evaluation: Type of Study: MBS-Modified Barium Swallow Study Patient Details Name: Mark Rivers MRN: [id number] Date of Birth: 1934/07/13 Today's Date: 11/25/2015 Time: SLP Start Time (ACUTE ONLY): 1006-SLP Stop Time (ACUTE ONLY): 1020 SLP Time Calculation (min) (ACUTE ONLY): 14 min Past Medical History: Past Medical History: Diagnosis Date . Allergy  . Back pain  . Coronary artery disease  . Diabetes mellitus  . GERD (gastroesophageal reflux disease)  . Hypertension  . Neuropathy [facility])  Past Surgical History: Past Surgical History: Procedure Laterality Date . CARDIAC CATHETERIZATION N/A 10/26/2015  Procedure: Left Heart Cath and Coronary Angiography;  Surgeon: Troy Sine, MD;  Location: [place] [facility];  Service: Cardiovascular;  Laterality: N/A; . cataract surg    bil/2008 . COLONOSCOPY   . CORONARY ARTERY BYPASS GRAFT N/A 10/26/2015  Procedure: CORONARY ARTERY BYPASS GRAFTING (CABG) x3(LIMA to LA, SVG to OM1, SVG to PDA) with EVH from the left thigh and partial lower leg greater saphenous vein and left internal mammary artery;  Surgeon: Ivin Poot, MD;  Location: [place];  Service: Open Heart Surgery;  Laterality: N/A; . CORONARY STENT PLACEMENT    1988 . TEE WITHOUT CARDIOVERSION N/A 10/26/2015  Procedure: TRANSESOPHAGEAL ECHOCARDIOGRAM (TEE);  Surgeon: Ivin Poot, MD;  Location: [place];  Service: Open Heart Surgery;  Laterality: N/A; . TONSILLECTOMY   HPI: 81 y.o.male presented with increasing dyspnea and a fib with RVR. Found to have 99% Lt main CAD and had emergent CABG 10/17. He remained intubated 10/17-10/26 until trach placed. PMH includes CAD, HTN, GERD, DM Subjective: pt alert, cooperative Assessment / Plan / Recommendation CHL IP CLINICAL IMPRESSIONS 11/25/2015 Therapy  Diagnosis Mild pharyngeal phase dysphagia;Mild oral phase dysphagia Clinical Impression Pt presents with mild oral and pharyngeal dysphagia. He showed lingual pumping with pureed and solid consistencies. All PO intake displayed a delay in swallow initiation to the valleculae, with silent aspiration occuring on large thin liquid boluses. Aspirates were eventually cleared, but several additional cues for coughing and throat clearing was needed. Airway remained protected given nectar thick liquids. Mild vallecular residue seen following liquid and pureed trials, but this cleared when given regular solid bolus. Barium pill was adminsitered with nectar thick liquid and no difficulty was observed. Recommend Dys 2 and nectar thick liquids with PMV worn for all PO intake. Solids will likely advance at bedside. Will continue to follow acutely. Impact on safety and function Mild aspiration risk   CHL IP TREATMENT RECOMMENDATION 11/25/2015 Treatment Recommendations Therapy as outlined in treatment plan below   Prognosis 11/25/2015 Prognosis for Safe Diet Advancement Good Barriers to Reach Goals --  Barriers/Prognosis Comment -- CHL IP DIET RECOMMENDATION 11/25/2015 SLP Diet Recommendations Dysphagia 2 (Fine chop) solids;Nectar thick liquid Liquid Administration via Cup;No straw Medication Administration Whole meds with liquid Compensations Minimize environmental distractions;Slow rate;Small sips/bites Postural Changes Seated upright at 90 degrees;Remain semi-upright after after feeds/meals (Comment)   CHL IP OTHER RECOMMENDATIONS 11/25/2015 Recommended Consults -- Oral Care Recommendations Oral care BID Other Recommendations Order thickener from pharmacy;Prohibited food (jello, ice cream, thin soups);Remove water pitcher   CHL IP FOLLOW UP RECOMMENDATIONS 11/25/2015 Follow up Recommendations LTACH   CHL IP FREQUENCY AND DURATION 11/25/2015 Speech Therapy Frequency (ACUTE ONLY) min 2x/week Treatment Duration 2 weeks      CHL IP  ORAL PHASE 11/25/2015 Oral Phase Impaired Oral - Pudding Teaspoon -- Oral - Pudding Cup -- Oral - Honey Teaspoon -- Oral - Honey Cup -- Oral - Nectar Teaspoon -- Oral - Nectar Cup -- Oral - Nectar Straw -- Oral - Thin Teaspoon -- Oral - Thin Cup -- Oral - Thin Straw -- Oral - Puree Lingual pumping;Decreased bolus cohesion Oral - Mech Soft -- Oral - Regular Lingual pumping;Decreased bolus cohesion;Impaired mastication Oral - Multi-Consistency -- Oral - Pill -- Oral Phase - Comment --  CHL IP PHARYNGEAL PHASE 11/25/2015 Pharyngeal Phase Impaired Pharyngeal- Pudding Teaspoon -- Pharyngeal -- Pharyngeal- Pudding Cup -- Pharyngeal -- Pharyngeal- Honey Teaspoon NT Pharyngeal -- Pharyngeal- Honey Cup -- Pharyngeal -- Pharyngeal- Nectar Teaspoon NT Pharyngeal -- Pharyngeal- Nectar Cup Delayed swallow initiation-vallecula;Reduced tongue base retraction;Pharyngeal residue - valleculae Pharyngeal -- Pharyngeal- Nectar Straw -- Pharyngeal -- Pharyngeal- Thin Teaspoon NT Pharyngeal -- Pharyngeal- Thin Cup Delayed swallow initiation-vallecula;Penetration/Aspiration before swallow;Pharyngeal residue - valleculae;Reduced tongue base retraction Pharyngeal Material enters airway, passes BELOW cords without attempt by patient to eject out (silent aspiration) Pharyngeal- Thin Straw -- Pharyngeal -- Pharyngeal- Puree Delayed swallow initiation-vallecula;Pharyngeal residue - valleculae;Reduced tongue base retraction Pharyngeal Material does not enter airway Pharyngeal- Mechanical Soft -- Pharyngeal -- Pharyngeal- Regular Delayed swallow initiation-vallecula;Reduced tongue base retraction Pharyngeal -- Pharyngeal- Multi-consistency -- Pharyngeal -- Pharyngeal- Pill Reduced tongue base retraction Pharyngeal -- Pharyngeal Comment --  CHL IP CERVICAL ESOPHAGEAL PHASE 11/25/2015 Cervical Esophageal Phase WFL Pudding Teaspoon -- Pudding Cup -- Honey Teaspoon -- Honey Cup -- Nectar Teaspoon -- Nectar Cup -- Nectar Straw -- Thin Teaspoon -- Thin  Cup -- Thin Straw -- Puree -- Mechanical Soft -- Regular -- Multi-consistency -- Pill -- Cervical Esophageal Comment -- No flowsheet data found. [name] 11/25/2015, 10:51 AM  Note populated for Jiles Prows, student SLP [name], M.A. CCC-SLP [phone]              Assessment/Plan: S/P Procedure(s) (LRB): CORONARY ARTERY BYPASS GRAFTING (CABG) x3(LIMA to LA, SVG to OM1, SVG to PDA) with EVH from the left thigh and partial lower leg greater saphenous vein and left internal mammary artery (N/A) TRANSESOPHAGEAL ECHOCARDIOGRAM (TEE) (N/A) INTRA-AORTIC BALLOON PUMP INSERTION size 40cc (Left)  1 stable  2 cont foley- check UA 3 loose stools- on TF's 4 has new feeding tube/ D2 diet 5 sugars reasonably well controlled 6 hold coumadin today   LOS: 32 days    Mark Rivers,Mark Rivers 11/26/2015 Now over a month after emerg CABG Needs rehabilitive therapies, slow trach wean Ready for LTAC transfer

## 2015-11-26 NOTE — Progress Notes (Signed)
Speech Language Pathology Treatment: Dysphagia  Patient Details Name: Mark Rivers MRN: [id number] DOB: [birth year] Today's Date: 11/26/2015 Time: 1450-1500 SLP Time Calculation (min) (ACUTE ONLY): 10 min  Assessment / Plan / Recommendation Clinical Impression  PMV was placed for PO intake consisting of Dys 1 textures and nectar thick liquids. No overt signs of intolerance noted with the valve. SLP provided Min cues for smaller bites/sips, with one instance of delayed coughing after trials. RN reports very little intake since diet was initiated. Encouraged pt to try PO intake to facilitate overall recovery. Continue to recommend Dys 2 diet and nectar thick liquids with PMV in place and full supervision. PMV was removed upon completion of session as RT was present to administer breathing tx.   HPI HPI: 81 y.o.male presented with increasing dyspnea and a fib with RVR. Found to have 99% Lt main CAD and had emergent CABG 10/17. He remained intubated 10/17-10/26 until trach placed. PMH includes CAD, HTN, GERD, DM      SLP Plan  Continue with current plan of care     Recommendations  Diet recommendations: Dysphagia 1 (puree);Nectar-thick liquid Liquids provided via: Cup;No straw Medication Administration: Whole meds with liquid Supervision: Patient able to self feed;Full supervision/cueing for compensatory strategies Compensations: Minimize environmental distractions;Slow rate;Small sips/bites Postural Changes and/or Swallow Maneuvers: Seated upright 90 degrees;Upright 30-60 min after meal      Patient may use Passy-Muir Speech Valve: Intermittently with supervision;During all therapies with supervision;During PO intake/meals PMSV Supervision: Intermittent (close) MD: Please consider changing trach tube to : Cuffless         Oral Care Recommendations: Oral care BID Follow up Recommendations: LTACH Plan: Continue with current plan of care       GO                [name] 11/26/2015, 3:59 PM  [name], M.A. CCC-SLP [phone]

## 2015-11-26 NOTE — Progress Notes (Signed)
Physical Therapy Treatment Patient Details Name: Mark Rivers MRN: [id number] DOB: 03/09/1934 Today's Date: 11/26/2015    History of Present Illness 81 yo with admission for chest pain, Afib with RVR s/p CABGx3 PMhx: CAD, CKD, and COPD     PT Comments    Pt limited limited his participation.  Pt needs lots of encouragement if he is going to do more than get to the chair.   Follow Up Recommendations  LTACH     Equipment Recommendations  Rolling walker with 5" wheels;3in1 (PT)    Recommendations for Other Services       Precautions / Restrictions Precautions Precautions: Fall;Sternal Precaution Comments: trach, flexiseal, panda Restrictions Weight Bearing Restrictions: No    Mobility  Bed Mobility Overal bed mobility: Needs Assistance Bed Mobility: Supine to Sit     Supine to sit: Min assist     General bed mobility comments: pt able to assist with scoot to EOB, then needed min assist to sit forward via R elbow  Transfers Overall transfer level: Needs assistance Equipment used: Rolling walker (2 wheeled) Transfers: Sit to/from [facility] Sit to Stand: Mod assist;+2 physical assistance;+2 safety/equipment Stand pivot transfers: Mod assist;+2 safety/equipment       General transfer comment: cues for hand placement  Ambulation/Gait Ambulation/Gait assistance: Min assist;Mod assist Ambulation Distance (Feet): 6 Feet Assistive device: Rolling walker (2 wheeled) Gait Pattern/deviations: Step-to pattern Gait velocity: slow Gait velocity interpretation: Below normal speed for age/gender General Gait Details: short, low amplitude steps, assist for stability and posture   Stairs            Wheelchair Mobility    Modified Rankin (Stroke Patients Only)       Balance Overall balance assessment: Needs assistance Sitting-balance support: Single extremity supported;Bilateral upper extremity supported Sitting balance-Leahy Scale:  Poor Sitting balance - Comments: needed ue's or external assist     Standing balance-Leahy Scale: Poor                      Cognition Arousal/Alertness: Awake/alert Behavior During Therapy: Anxious Overall Cognitive Status: Impaired/Different from baseline Area of Impairment: Following commands;Safety/judgement;Awareness;Problem solving   Current Attention Level: Sustained   Following Commands: Follows one step commands with increased time Safety/Judgement: Decreased awareness of safety;Decreased awareness of deficits Awareness: Emergent Problem Solving: Slow processing      Exercises      General Comments General comments (skin integrity, edema, etc.): vitals stable, SpO2 95%       Pertinent Vitals/Pain Pain Assessment: Faces Faces Pain Scale: No hurt    Home Living                      Prior Function            PT Goals (current goals can now be found in the care plan section) Acute Rehab PT Goals PT Goal Formulation: With patient Time For Goal Achievement: 11/25/15 Potential to Achieve Goals: Fair Progress towards PT goals: Progressing toward goals    Frequency    Min 3X/week      PT Plan Current plan remains appropriate    Co-evaluation             End of Session Equipment Utilized During Treatment: Oxygen Activity Tolerance: Other (comment) (pt needs lots of encouragement and only does a little) Patient left: in chair;with call bell/phone within reach;with chair alarm set     Time: [id number] PT Time Calculation (min) (ACUTE ONLY): 32 min  Charges:  $Gait Training: 8-22 mins $Therapeutic Activity: 8-22 mins                    G Codes:      Tessie Fass Verity Gilcrest 11/26/2015, 12:58 PM  11/26/2015  Donnella Sham, [place] [phone]  (pager)

## 2015-11-26 NOTE — Clinical Social Work Note (Signed)
Patient discharging to [facility] today.   CSW signing off. Consult again if any social work needs arise.  Dayton Scrape, [place]

## 2015-11-26 NOTE — Care Management Note (Signed)
Case Management Note  Patient Details  Name: Mark Rivers MRN: [id number] Date of Birth: 1934/07/09  Subjective/Objective:     Patient for dc to LTAC , [facility] today.                 Action/Plan:   Expected Discharge Date:                  Expected Discharge Plan:  Long Term Acute Care (LTAC)  In-House Referral:     Discharge planning Services  CM Consult  Post Acute Care Choice:    Choice offered to:  Patient, Sibling  DME Arranged:    DME Agency:     HH Arranged:    [place] Agency:     Status of Service:  Completed, signed off  If discussed at [employer] of Stay Meetings, dates discussed:    Additional Comments:  Zenon Mayo, RN 11/26/2015, 10:18 AM

## 2015-11-26 NOTE — Progress Notes (Signed)
Patient report called to [facility]. All belongs packed and sent with patient to new room. Transported via bed on TC O2.

## 2015-11-27 LAB — CBC WITH DIFFERENTIAL/PLATELET
Basophils Absolute: 0 10*3/uL (ref 0.0–0.1)
Basophils Relative: 0 %
EOS ABS: 1.2 10*3/uL — AB (ref 0.0–0.7)
EOS PCT: 12 %
HCT: 31.9 % — ABNORMAL LOW (ref 39.0–52.0)
Hemoglobin: 9.5 g/dL — ABNORMAL LOW (ref 13.0–17.0)
LYMPHS ABS: 1.7 10*3/uL (ref 0.7–4.0)
LYMPHS PCT: 17 %
MCH: 29.2 pg (ref 26.0–34.0)
MCHC: 29.8 g/dL — AB (ref 30.0–36.0)
MCV: 98.2 fL (ref 78.0–100.0)
MONO ABS: 0.8 10*3/uL (ref 0.1–1.0)
MONOS PCT: 7 %
Neutro Abs: 6.7 10*3/uL (ref 1.7–7.7)
Neutrophils Relative %: 64 %
PLATELETS: 413 10*3/uL — AB (ref 150–400)
RBC: 3.25 MIL/uL — ABNORMAL LOW (ref 4.22–5.81)
RDW: 16.7 % — AB (ref 11.5–15.5)
WBC: 10.4 10*3/uL (ref 4.0–10.5)

## 2015-11-27 LAB — COMPREHENSIVE METABOLIC PANEL
ALK PHOS: 92 U/L (ref 38–126)
ALT: 40 U/L (ref 17–63)
AST: 33 U/L (ref 15–41)
Albumin: 2.3 g/dL — ABNORMAL LOW (ref 3.5–5.0)
Anion gap: 8 (ref 5–15)
BILIRUBIN TOTAL: 0.7 mg/dL (ref 0.3–1.2)
BUN: 22 mg/dL — AB (ref 6–20)
CALCIUM: 8.7 mg/dL — AB (ref 8.9–10.3)
CO2: 30 mmol/L (ref 22–32)
Chloride: 103 mmol/L (ref 101–111)
Creatinine, Ser: 0.99 mg/dL (ref 0.61–1.24)
GFR calc Af Amer: >60 mL/min (ref >60)
GLUCOSE: 106 mg/dL — AB (ref 65–99)
POTASSIUM: 4.1 mmol/L (ref 3.5–5.1)
Sodium: 141 mmol/L (ref 135–145)
TOTAL PROTEIN: 6.6 g/dL (ref 6.5–8.1)

## 2015-11-27 LAB — TSH: TSH: 2.639 u[IU]/mL (ref 0.350–4.500)

## 2015-11-27 LAB — PHOSPHORUS: Phosphorus: 3 mg/dL (ref 2.5–4.6)

## 2015-11-27 LAB — PROTIME-INR
INR: 2.55
PROTHROMBIN TIME: 27.9 s — AB (ref 11.4–15.2)

## 2015-11-27 LAB — MAGNESIUM: MAGNESIUM: 2 mg/dL (ref 1.7–2.4)

## 2015-11-28 LAB — HEMOGLOBIN A1C
Hgb A1c MFr Bld: 5.7 % — ABNORMAL HIGH (ref 4.8–5.6)
Mean Plasma Glucose: 117 mg/dL

## 2015-11-28 LAB — URINE CULTURE: CULTURE: NO GROWTH

## 2015-11-28 LAB — PROTIME-INR
INR: 2.98
PROTHROMBIN TIME: 31.6 s — AB (ref 11.4–15.2)

## 2015-11-29 LAB — CULTURE, RESPIRATORY W GRAM STAIN: Culture: NORMAL

## 2015-11-29 LAB — BASIC METABOLIC PANEL
Anion gap: 10 (ref 5–15)
BUN: 21 mg/dL — AB (ref 6–20)
CALCIUM: 8.6 mg/dL — AB (ref 8.9–10.3)
CHLORIDE: 102 mmol/L (ref 101–111)
CO2: 25 mmol/L (ref 22–32)
CREATININE: 1.11 mg/dL (ref 0.61–1.24)
GFR calc non Af Amer: >60 mL/min (ref >60)
Glucose, Bld: 110 mg/dL — ABNORMAL HIGH (ref 65–99)
Potassium: 4.4 mmol/L (ref 3.5–5.1)
SODIUM: 137 mmol/L (ref 135–145)

## 2015-11-29 LAB — CBC WITH DIFFERENTIAL/PLATELET
Basophils Absolute: 0 10*3/uL (ref 0.0–0.1)
Basophils Relative: 0 %
Eosinophils Absolute: 1.3 10*3/uL — ABNORMAL HIGH (ref 0.0–0.7)
Eosinophils Relative: 12 %
HCT: 33.7 % — ABNORMAL LOW (ref 39.0–52.0)
Hemoglobin: 10.2 g/dL — ABNORMAL LOW (ref 13.0–17.0)
LYMPHS ABS: 1.6 10*3/uL (ref 0.7–4.0)
Lymphocytes Relative: 15 %
MCH: 29.4 pg (ref 26.0–34.0)
MCHC: 30.3 g/dL (ref 30.0–36.0)
MCV: 97.1 fL (ref 78.0–100.0)
MONO ABS: 0.9 10*3/uL (ref 0.1–1.0)
MONOS PCT: 8 %
Neutro Abs: 7 10*3/uL (ref 1.7–7.7)
Neutrophils Relative %: 65 %
Platelets: 385 10*3/uL (ref 150–400)
RBC: 3.47 MIL/uL — ABNORMAL LOW (ref 4.22–5.81)
RDW: 16.3 % — AB (ref 11.5–15.5)
WBC: 10.8 10*3/uL — ABNORMAL HIGH (ref 4.0–10.5)

## 2015-11-29 LAB — MAGNESIUM: MAGNESIUM: 2 mg/dL (ref 1.7–2.4)

## 2015-11-29 LAB — PHOSPHORUS: PHOSPHORUS: 3.4 mg/dL (ref 2.5–4.6)

## 2015-11-29 LAB — PROTIME-INR
INR: 3.75
Prothrombin Time: 38 seconds — ABNORMAL HIGH (ref 11.4–15.2)

## 2015-11-29 LAB — CULTURE, RESPIRATORY

## 2015-11-29 LAB — DIGOXIN LEVEL: Digoxin Level: 0.7 ng/mL — ABNORMAL LOW (ref 0.8–2.0)

## 2015-11-30 LAB — CBC
HEMATOCRIT: 36.6 % — AB (ref 39.0–52.0)
Hemoglobin: 10.9 g/dL — ABNORMAL LOW (ref 13.0–17.0)
MCH: 28.9 pg (ref 26.0–34.0)
MCHC: 29.8 g/dL — AB (ref 30.0–36.0)
MCV: 97.1 fL (ref 78.0–100.0)
PLATELETS: 473 10*3/uL — AB (ref 150–400)
RBC: 3.77 MIL/uL — ABNORMAL LOW (ref 4.22–5.81)
RDW: 16.3 % — AB (ref 11.5–15.5)
WBC: 12.9 10*3/uL — AB (ref 4.0–10.5)

## 2015-11-30 LAB — PROTIME-INR
INR: 4.33
Prothrombin Time: 43.1 seconds — ABNORMAL HIGH (ref 11.4–15.2)

## 2015-12-01 ENCOUNTER — Encounter: Payer: Self-pay | Admitting: Cardiology

## 2015-12-01 LAB — CULTURE, BLOOD (ROUTINE X 2)
CULTURE: NO GROWTH
Culture: NO GROWTH

## 2015-12-01 LAB — PROTIME-INR
INR: 3.39
Prothrombin Time: 35 seconds — ABNORMAL HIGH (ref 11.4–15.2)

## 2015-12-02 ENCOUNTER — Other Ambulatory Visit (HOSPITAL_COMMUNITY): Payer: Medicare Other

## 2015-12-02 LAB — MAGNESIUM: Magnesium: 1.9 mg/dL (ref 1.7–2.4)

## 2015-12-02 LAB — RENAL FUNCTION PANEL
ALBUMIN: 2.2 g/dL — AB (ref 3.5–5.0)
Anion gap: 8 (ref 5–15)
BUN: 25 mg/dL — AB (ref 6–20)
CHLORIDE: 100 mmol/L — AB (ref 101–111)
CO2: 30 mmol/L (ref 22–32)
CREATININE: 1.26 mg/dL — AB (ref 0.61–1.24)
Calcium: 8.8 mg/dL — ABNORMAL LOW (ref 8.9–10.3)
GFR calc Af Amer: >60 mL/min (ref >60)
GFR, EST NON AFRICAN AMERICAN: 52 mL/min — AB (ref >60)
Glucose, Bld: 118 mg/dL — ABNORMAL HIGH (ref 65–99)
PHOSPHORUS: 3.5 mg/dL (ref 2.5–4.6)
Potassium: 4.3 mmol/L (ref 3.5–5.1)
Sodium: 138 mmol/L (ref 135–145)

## 2015-12-02 LAB — CBC WITH DIFFERENTIAL/PLATELET
BASOS ABS: 0 10*3/uL (ref 0.0–0.1)
BASOS PCT: 0 %
EOS ABS: 1 10*3/uL — AB (ref 0.0–0.7)
EOS PCT: 8 %
HCT: 33.5 % — ABNORMAL LOW (ref 39.0–52.0)
Hemoglobin: 10.3 g/dL — ABNORMAL LOW (ref 13.0–17.0)
Lymphocytes Relative: 13 %
Lymphs Abs: 1.6 10*3/uL (ref 0.7–4.0)
MCH: 29.2 pg (ref 26.0–34.0)
MCHC: 30.7 g/dL (ref 30.0–36.0)
MCV: 94.9 fL (ref 78.0–100.0)
MONO ABS: 1.3 10*3/uL — AB (ref 0.1–1.0)
Monocytes Relative: 10 %
Neutro Abs: 9 10*3/uL — ABNORMAL HIGH (ref 1.7–7.7)
Neutrophils Relative %: 70 %
PLATELETS: 430 10*3/uL — AB (ref 150–400)
RBC: 3.53 MIL/uL — ABNORMAL LOW (ref 4.22–5.81)
RDW: 15.8 % — AB (ref 11.5–15.5)
WBC: 12.9 10*3/uL — ABNORMAL HIGH (ref 4.0–10.5)

## 2015-12-03 LAB — PROTIME-INR
INR: 2.46
Prothrombin Time: 27.2 seconds — ABNORMAL HIGH (ref 11.4–15.2)

## 2015-12-04 LAB — CBC WITH DIFFERENTIAL/PLATELET
BASOS ABS: 0.1 10*3/uL (ref 0.0–0.1)
BASOS PCT: 1 %
EOS ABS: 0.9 10*3/uL — AB (ref 0.0–0.7)
Eosinophils Relative: 7 %
HEMATOCRIT: 32.6 % — AB (ref 39.0–52.0)
HEMOGLOBIN: 9.9 g/dL — AB (ref 13.0–17.0)
Lymphocytes Relative: 10 %
Lymphs Abs: 1.4 10*3/uL (ref 0.7–4.0)
MCH: 28.9 pg (ref 26.0–34.0)
MCHC: 30.4 g/dL (ref 30.0–36.0)
MCV: 95 fL (ref 78.0–100.0)
MONOS PCT: 8 %
Monocytes Absolute: 1.1 10*3/uL — ABNORMAL HIGH (ref 0.1–1.0)
NEUTROS ABS: 9.8 10*3/uL — AB (ref 1.7–7.7)
NEUTROS PCT: 74 %
Platelets: 492 10*3/uL — ABNORMAL HIGH (ref 150–400)
RBC: 3.43 MIL/uL — AB (ref 4.22–5.81)
RDW: 15.6 % — ABNORMAL HIGH (ref 11.5–15.5)
WBC: 13.2 10*3/uL — AB (ref 4.0–10.5)

## 2015-12-04 LAB — PROTIME-INR
INR: 2.5
PROTHROMBIN TIME: 27.5 s — AB (ref 11.4–15.2)

## 2015-12-05 ENCOUNTER — Other Ambulatory Visit (HOSPITAL_COMMUNITY): Payer: Medicare Other

## 2015-12-05 LAB — PROTIME-INR
INR: 2.47
PROTHROMBIN TIME: 27.2 s — AB (ref 11.4–15.2)

## 2015-12-06 LAB — CBC WITH DIFFERENTIAL/PLATELET
BASOS ABS: 0.1 10*3/uL (ref 0.0–0.1)
BASOS PCT: 0 %
EOS PCT: 8 %
Eosinophils Absolute: 0.8 10*3/uL — ABNORMAL HIGH (ref 0.0–0.7)
HEMATOCRIT: 34.7 % — AB (ref 39.0–52.0)
Hemoglobin: 10.5 g/dL — ABNORMAL LOW (ref 13.0–17.0)
LYMPHS PCT: 11 %
Lymphs Abs: 1.2 10*3/uL (ref 0.7–4.0)
MCH: 28.7 pg (ref 26.0–34.0)
MCHC: 30.3 g/dL (ref 30.0–36.0)
MCV: 94.8 fL (ref 78.0–100.0)
MONO ABS: 0.9 10*3/uL (ref 0.1–1.0)
Monocytes Relative: 8 %
NEUTROS ABS: 8.2 10*3/uL — AB (ref 1.7–7.7)
Neutrophils Relative %: 73 %
PLATELETS: 502 10*3/uL — AB (ref 150–400)
RBC: 3.66 MIL/uL — AB (ref 4.22–5.81)
RDW: 15.3 % (ref 11.5–15.5)
WBC: 11.2 10*3/uL — AB (ref 4.0–10.5)

## 2015-12-06 LAB — PROTIME-INR
INR: 2.28
Prothrombin Time: 25.5 seconds — ABNORMAL HIGH (ref 11.4–15.2)

## 2015-12-07 LAB — URINE CULTURE

## 2015-12-07 LAB — PROTIME-INR
INR: 2.71
PROTHROMBIN TIME: 29.3 s — AB (ref 11.4–15.2)

## 2015-12-08 LAB — CBC WITH DIFFERENTIAL/PLATELET
Basophils Absolute: 0.1 10*3/uL (ref 0.0–0.1)
Basophils Relative: 1 %
EOS PCT: 7 %
Eosinophils Absolute: 0.7 10*3/uL (ref 0.0–0.7)
HEMATOCRIT: 34.3 % — AB (ref 39.0–52.0)
HEMOGLOBIN: 10.4 g/dL — AB (ref 13.0–17.0)
LYMPHS ABS: 1.4 10*3/uL (ref 0.7–4.0)
LYMPHS PCT: 13 %
MCH: 28.6 pg (ref 26.0–34.0)
MCHC: 30.3 g/dL (ref 30.0–36.0)
MCV: 94.2 fL (ref 78.0–100.0)
Monocytes Absolute: 0.8 10*3/uL (ref 0.1–1.0)
Monocytes Relative: 8 %
Neutro Abs: 7.7 10*3/uL (ref 1.7–7.7)
Neutrophils Relative %: 71 %
PLATELETS: 465 10*3/uL — AB (ref 150–400)
RBC: 3.64 MIL/uL — AB (ref 4.22–5.81)
RDW: 15.2 % (ref 11.5–15.5)
WBC: 10.6 10*3/uL — AB (ref 4.0–10.5)

## 2015-12-08 LAB — RENAL FUNCTION PANEL
ANION GAP: 10 (ref 5–15)
Albumin: 2.3 g/dL — ABNORMAL LOW (ref 3.5–5.0)
BUN: 32 mg/dL — ABNORMAL HIGH (ref 6–20)
CHLORIDE: 99 mmol/L — AB (ref 101–111)
CO2: 30 mmol/L (ref 22–32)
Calcium: 9.3 mg/dL (ref 8.9–10.3)
Creatinine, Ser: 1.32 mg/dL — ABNORMAL HIGH (ref 0.61–1.24)
GFR calc non Af Amer: 50 mL/min — ABNORMAL LOW (ref >60)
GFR, EST AFRICAN AMERICAN: 58 mL/min — AB (ref >60)
Glucose, Bld: 114 mg/dL — ABNORMAL HIGH (ref 65–99)
POTASSIUM: 4.9 mmol/L (ref 3.5–5.1)
Phosphorus: 3.6 mg/dL (ref 2.5–4.6)
Sodium: 139 mmol/L (ref 135–145)

## 2015-12-08 LAB — MAGNESIUM: Magnesium: 2.1 mg/dL (ref 1.7–2.4)

## 2015-12-08 LAB — PROTIME-INR
INR: 2.93
PROTHROMBIN TIME: 31.2 s — AB (ref 11.4–15.2)

## 2015-12-09 ENCOUNTER — Ambulatory Visit: Payer: Medicare Other | Admitting: Cardiovascular Disease

## 2015-12-09 LAB — PREPARE FRESH FROZEN PLASMA
UNIT DIVISION: 0
Unit division: 0

## 2015-12-09 LAB — BASIC METABOLIC PANEL
Anion gap: 9 (ref 5–15)
BUN: 36 mg/dL — AB (ref 6–20)
CALCIUM: 9.1 mg/dL (ref 8.9–10.3)
CO2: 30 mmol/L (ref 22–32)
CREATININE: 1.39 mg/dL — AB (ref 0.61–1.24)
Chloride: 97 mmol/L — ABNORMAL LOW (ref 101–111)
GFR calc Af Amer: 54 mL/min — ABNORMAL LOW (ref >60)
GFR, EST NON AFRICAN AMERICAN: 47 mL/min — AB (ref >60)
GLUCOSE: 115 mg/dL — AB (ref 65–99)
Potassium: 5 mmol/L (ref 3.5–5.1)
SODIUM: 136 mmol/L (ref 135–145)

## 2015-12-09 LAB — DIGOXIN LEVEL: DIGOXIN LVL: 0.9 ng/mL (ref 0.8–2.0)

## 2015-12-09 LAB — PROTIME-INR
INR: 2.61
Prothrombin Time: 28.5 seconds — ABNORMAL HIGH (ref 11.4–15.2)

## 2015-12-10 LAB — PROTIME-INR
INR: 1.96
Prothrombin Time: 22.6 seconds — ABNORMAL HIGH (ref 11.4–15.2)

## 2015-12-12 LAB — PROTIME-INR
INR: 2.25
Prothrombin Time: 25.3 seconds — ABNORMAL HIGH (ref 11.4–15.2)

## 2015-12-13 ENCOUNTER — Encounter: Payer: Medicare Other | Admitting: Cardiology

## 2015-12-13 LAB — PROTIME-INR
INR: 2.23
Prothrombin Time: 25.1 seconds — ABNORMAL HIGH (ref 11.4–15.2)

## 2015-12-14 LAB — RENAL FUNCTION PANEL
ANION GAP: 10 (ref 5–15)
Albumin: 2.6 g/dL — ABNORMAL LOW (ref 3.5–5.0)
BUN: 38 mg/dL — ABNORMAL HIGH (ref 6–20)
CALCIUM: 9.6 mg/dL (ref 8.9–10.3)
CHLORIDE: 98 mmol/L — AB (ref 101–111)
CO2: 31 mmol/L (ref 22–32)
CREATININE: 1.25 mg/dL — AB (ref 0.61–1.24)
GFR calc Af Amer: >60 mL/min (ref >60)
GFR calc non Af Amer: 53 mL/min — ABNORMAL LOW (ref >60)
GLUCOSE: 129 mg/dL — AB (ref 65–99)
Phosphorus: 3.6 mg/dL (ref 2.5–4.6)
Potassium: 4.4 mmol/L (ref 3.5–5.1)
SODIUM: 139 mmol/L (ref 135–145)

## 2015-12-14 LAB — CBC WITH DIFFERENTIAL/PLATELET
BASOS PCT: 1 %
Basophils Absolute: 0.1 10*3/uL (ref 0.0–0.1)
EOS PCT: 11 %
Eosinophils Absolute: 1.5 10*3/uL — ABNORMAL HIGH (ref 0.0–0.7)
HEMATOCRIT: 34 % — AB (ref 39.0–52.0)
HEMOGLOBIN: 10.7 g/dL — AB (ref 13.0–17.0)
LYMPHS PCT: 6 %
Lymphs Abs: 0.8 10*3/uL (ref 0.7–4.0)
MCH: 29.3 pg (ref 26.0–34.0)
MCHC: 31.5 g/dL (ref 30.0–36.0)
MCV: 93.2 fL (ref 78.0–100.0)
MONO ABS: 1.1 10*3/uL — AB (ref 0.1–1.0)
MONOS PCT: 8 %
NEUTROS PCT: 74 %
Neutro Abs: 10.1 10*3/uL — ABNORMAL HIGH (ref 1.7–7.7)
Platelets: INCREASED 10*3/uL (ref 150–400)
RBC: 3.65 MIL/uL — AB (ref 4.22–5.81)
RDW: 16.3 % — AB (ref 11.5–15.5)
WBC: 13.6 10*3/uL — AB (ref 4.0–10.5)

## 2015-12-14 LAB — MAGNESIUM: MAGNESIUM: 1.9 mg/dL (ref 1.7–2.4)

## 2015-12-15 LAB — PROTIME-INR
INR: 2.65
Prothrombin Time: 28.8 seconds — ABNORMAL HIGH (ref 11.4–15.2)

## 2015-12-16 ENCOUNTER — Encounter: Payer: Self-pay | Admitting: Physician Assistant

## 2015-12-16 LAB — PROTIME-INR

## 2015-12-17 ENCOUNTER — Telehealth: Payer: Self-pay | Admitting: *Deleted

## 2015-12-17 NOTE — Telephone Encounter (Signed)
This pt's INR results came to [place] today. The PA does not manage pt's INR. Per Brynda Rim. PA to find out who does manage INR. I then s/w pt in regards to who manages his coumadin. Pt said it is Rafaela Arville Care, MD at [facility]. I will fax this results to PCP for the pt. Pt said thank you. I did also confirm pt's appt 12/21/15 11:15 with Brynda Rim. PA.

## 2015-12-21 ENCOUNTER — Ambulatory Visit (INDEPENDENT_AMBULATORY_CARE_PROVIDER_SITE_OTHER): Payer: Medicare Other | Admitting: Physician Assistant

## 2015-12-21 ENCOUNTER — Encounter: Payer: Self-pay | Admitting: Physician Assistant

## 2015-12-21 VITALS — BP 124/62 | HR 84 | Ht 67.0 in | Wt 194.0 lb

## 2015-12-21 DIAGNOSIS — I5032 Chronic diastolic (congestive) heart failure: Secondary | ICD-10-CM | POA: Diagnosis not present

## 2015-12-21 DIAGNOSIS — E7800 Pure hypercholesterolemia, unspecified: Secondary | ICD-10-CM

## 2015-12-21 DIAGNOSIS — I4819 Other persistent atrial fibrillation: Secondary | ICD-10-CM

## 2015-12-21 DIAGNOSIS — I214 Non-ST elevation (NSTEMI) myocardial infarction: Secondary | ICD-10-CM

## 2015-12-21 DIAGNOSIS — N183 Chronic kidney disease, stage 3 unspecified: Secondary | ICD-10-CM

## 2015-12-21 DIAGNOSIS — Z930 Tracheostomy status: Secondary | ICD-10-CM | POA: Insufficient documentation

## 2015-12-21 DIAGNOSIS — I2510 Atherosclerotic heart disease of native coronary artery without angina pectoris: Secondary | ICD-10-CM

## 2015-12-21 DIAGNOSIS — Z9889 Other specified postprocedural states: Secondary | ICD-10-CM

## 2015-12-21 DIAGNOSIS — I5033 Acute on chronic diastolic (congestive) heart failure: Secondary | ICD-10-CM | POA: Insufficient documentation

## 2015-12-21 DIAGNOSIS — I481 Persistent atrial fibrillation: Secondary | ICD-10-CM | POA: Diagnosis not present

## 2015-12-21 DIAGNOSIS — I10 Essential (primary) hypertension: Secondary | ICD-10-CM

## 2015-12-21 HISTORY — DX: Chronic diastolic (congestive) heart failure: I50.32

## 2015-12-21 MED ORDER — ATORVASTATIN CALCIUM 80 MG PO TABS: 80.0000 mg | ORAL_TABLET | Freq: Every day | ORAL | 3 refills | Status: DC

## 2015-12-21 NOTE — Patient Instructions (Addendum)
Medication Instructions:  STOP taking Zocor (Simvastatin) START taking Atorvastatin 80 mg Once daily   Labwork: In 2 months - FASTING Lipids and LFTs 02/21/16 LAB OPENS AT 7:30 AM  Testing/Procedures: None   Follow-Up: Dr. Jenkins Rouge in 4 weeks (ok to see Richardson Dopp, PA-C on a day Dr. Jenkins Rouge is in the office).  Any Other Special Instructions Will Be Listed Below (If Applicable). I would like your primary care doctor to check your INR (Coumadin level) once a week for now.  Please have her fax a copy of each test.  When we see you back, we may decide to schedule a Cardioversion.  This is a procedure to put your heart back in normal rhythm.  We need your INR to be greater than 2 for at least a month to do this procedure.  If you need a refill on your cardiac medications before your next appointment, please call your pharmacy.

## 2015-12-21 NOTE — Progress Notes (Signed)
Cardiology Office Note:    Date:  12/21/2015   ID:  Mark Rivers, DOB 02/27/1934, MRN [id number]  PCP:  Mark Rivers., MD  Cardiologist:  Dr. Jenkins Rivers   Electrophysiologist:  n/a Nephrology: Dr. Olivia Mackie Phoebe Rivers)  Referring MD: Mark Peers, MD   Chief Complaint  Patient presents with  . Hospitalization Follow-up    s/p emergent CABG    History of Present Illness:    Mark Rivers is a 81 y.o. male with a hx of CAD s/p prior angioplasty in 1988, DM2, CKD, HTN, HL, COPD.  He was seen in the office in 10/17 with exertional chest pain and given his CKD, non-invasive testing was originally planned.  However, he presented to the ED on 10/25/15 with prolonged chest pain and noted to be in AF with RVR.  He ruled in for NSTEMI.   LHC demonstrated critical LM stenosis and high grade proximal RCA disease.  He was sent for emergent CABG with Dr. Prescott Rivers (L-LAD, S-OM2, S-PDA).  He was supported with IABP.  He was treated with IV Amiodarone for rate control of atrial fibrillation.  His post op course was complicated.  He had blood loss anemia requiring transfusion with PRBCs x 2 units.  He was difficult to wean from the ventilator and required tracheostomy.  He required a chest tube for R pleural effusion.  There was concern for HCAP and he was noted to MRSE bacteremia.  He completed 14 days of Vancomycin.  Diuresis was limited at times due to AKI.  He did have recurrent atrial fibrillation and he was placed on Coumadin.  He required tube feeds.  Due to significant deconditioning, he was DC'd to a long term acute care facility on 11/24/15.    He returns for post hospitalization follow-up. He was in [facility] until last Wednesday (11/15-12/6). He is now at home with home health nursing and physical therapy services. He is in the office today with his brother who is visiting from [place]. Mark Rivers continues to feel weak. He notes dyspnea with most activities. He arrives  in a wheelchair today. He does feel his strength is improving. He denies chest discomfort. He denies syncope, orthopnea, PND. He does have some ankle edema without significant change. His appetite is good. He denies fevers or cough.  Of note, his tracheostomy has been closed.    Prior CV studies that were reviewed today include:    Limited echo 11/09/15 EF 50-55, normal wall motion, ventricular septal dyssynergy, moderate MAC, mild MR, possible fibrinous linear projection originating from mitral annular calcification within LA  Complete echo 11/02/15 Moderate LVH, EF 55-60, normal wall motion, grade 2 diastolic dysfunction, mild aortic stenosis (mean 14, peak 34), MAC, mild LAE  LHC 10/26/15 LM distal 99 thrombotic LAD okay LCx okay RCA proximal 80, mid 30 EF 40-45  Myoview 10/16 EF 51, no ischemia or scar; Low Risk  Echo 6/14 Mild to moderate LVH, probable mild to moderately reduced LVEF, moderate LAE, MAC, mild MR, mild TR  Myoview 6/14 No ischemia, EF 49  Past Medical History:  Diagnosis Date  . Allergy   . Back pain   . Chronic diastolic CHF (congestive heart failure) (Mark Rivers) 12/21/2015   A. Echo 10/17: Moderate LVH, EF 55-60, normal wall motion, grade 2 diastolic dysfunction, mild aortic stenosis (mean 14, peak 34), MAC, mild LAE    . CKD (chronic kidney disease) stage 3, GFR 30-59 ml/min 03/04/2014  . Coronary artery disease  a. s/p remote POBA in 1988 // b. NSTEMI in 10/17 >> LHC with 99% LM stenosis >> emergent CABG (L-LAD, S-OM2, S-PDA) - post op course complicated (prolonged ventilation, s/p Trach, AFib, anemia req transfusion, bacteremia >> DC to LTAC)  . Diabetes mellitus   . GERD (gastroesophageal reflux disease)   . History of nuclear stress test    a. Myoview 6/14 - EF 51, no ischemia or scar; Low Risk  . Hyperlipemia 09/11/2013   Last Assessment & Plan:  Lipid abnormalities are improving with treatment. Nutritional counseling was provided. and Pharmacotherapy as  ordered. Lipids will be reassessed in 3 months.  . Hypertension   . Neuropathy (Mark Rivers)   . Persistent atrial fibrillation (Mark Rivers) 10/25/2015    Past Surgical History:  Procedure Laterality Date  . CARDIAC CATHETERIZATION N/A 10/26/2015   Procedure: Left Heart Cath and Coronary Angiography;  Surgeon: Mark Sine, MD;  Location: [place] [facility];  Service: Cardiovascular;  Laterality: N/A;  . cataract surg     bil/2008  . COLONOSCOPY    . CORONARY ARTERY BYPASS GRAFT N/A 10/26/2015   Procedure: CORONARY ARTERY BYPASS GRAFTING (CABG) x3(LIMA to LA, SVG to OM1, SVG to PDA) with EVH from the left thigh and partial lower leg greater saphenous vein and left internal mammary artery;  Surgeon: Mark Poot, MD;  Location: [place];  Service: Open Heart Surgery;  Laterality: N/A;  . CORONARY STENT PLACEMENT     1988  . TEE WITHOUT CARDIOVERSION N/A 10/26/2015   Procedure: TRANSESOPHAGEAL ECHOCARDIOGRAM (TEE);  Surgeon: Mark Poot, MD;  Location: [place];  Service: Open Heart Surgery;  Laterality: N/A;  . TONSILLECTOMY      Current Medications: Current Meds  Medication Sig  . amLODipine (NORVASC) 10 MG tablet TAKE 1 TABLET (10 MG TOTAL) BY MOUTH DAILY.  [name] aspirin EC 325 MG tablet Take 325 mg by mouth daily.  . budesonide (PULMICORT) 0.5 MG/2ML nebulizer solution Take 0.5 mg by nebulization daily.   . carvedilol (COREG) 3.125 MG tablet Take 1 tablet (3.125 mg total) by mouth 2 (two) times daily with a meal.  . cholecalciferol (VITAMIN D) 1000 units tablet Take 1,000 Units by mouth daily.  . diphenhydramine-acetaminophen (TYLENOL PM) 25-500 MG TABS tablet Take 2 tablets by mouth at bedtime as needed (for sleep).   . fenofibrate 54 MG tablet Take 54 mg by mouth daily.  . furosemide (LASIX) 20 MG tablet TAKE 1 TABLET (20 MG TOTAL) BY MOUTH DAILY.  [name] Glucosamine-Chondroit-Vit C-Mn (GLUCOSAMINE CHONDR 1500 COMPLX PO) Take 2 tablets by mouth daily.   . hydrALAZINE (APRESOLINE) 50 MG tablet Take  75 mg by mouth 3 (three) times daily.  . isosorbide mononitrate (IMDUR) 30 MG 24 hr tablet Take 1 tablet (30 mg total) by mouth daily.  [name] levocetirizine (XYZAL) 5 MG tablet Take 0.5 tablets (2.5 mg total) by mouth every evening.  [name] losartan (COZAAR) 100 MG tablet Take 1 tablet (100 mg total) by mouth daily.  . niacin (NIASPAN) 1000 MG CR tablet Take 1,000 mg by mouth at bedtime.  Nelva Nay SOLOSTAR 300 UNIT/ML SOPN Inject 30 Units into the skin at bedtime.   . traZODone (DESYREL) 50 MG tablet Take 100 mg by mouth at bedtime.   [name] warfarin (COUMADIN) 1 MG tablet Take 3 mg by mouth daily at 6 PM.  . [DISCONTINUED] simvastatin (ZOCOR) 10 MG tablet Take 10 mg by mouth daily at 6 PM.      Allergies:   Amoxicillin;  Cephalexin; and Penicillin g   Social History   Social History  . Marital status: Married    Spouse name: N/A  . Number of children: N/A  . Years of education: N/A   Occupational History  . retired    Social History Main Topics  . Smoking status: Former Smoker    Packs/day: 2.00    Years: 30.00    Types: Cigarettes    Quit date: 06/28/2004  . Smokeless tobacco: Never Used  . Alcohol use No     Comment: QUIT 06/28/2004  . Drug use: No  . Sexual activity: Yes   Other Topics Concern  . None   Social History Narrative  . None     Family History:  The patient's family history includes Diabetes in his father.   ROS:   Please see the history of present illness.    ROS All other systems reviewed and are negative.   EKGs/Labs/Other Test Reviewed:    EKG:  EKG is  ordered today.  The ekg ordered today demonstrates Atrial fibrillation, HR 84, normal axis, poor R-wave progression, T-wave inversions in 1, 2, aVF, V6, QTc 439 ms, similar to prior tracing dated 11/13/15  Recent Labs: 10/26/2015: B Natriuretic Peptide 225.2 11/27/2015: ALT 40; TSH 2.639 12/14/2015: BUN 38; Creatinine, Ser 1.25; Hemoglobin 10.7; Magnesium 1.9; Platelets PLATELETS APPEAR INCREASED; Potassium  4.4; Sodium 139   Recent Lipid Panel    Component Value Date/Time   TRIG 268 (H) 11/05/2015 1325     Physical Exam:    VS:  BP 124/62   Pulse 84   Ht 5\' 7"  (1.702 m)   Wt 194 lb (88 kg)   BMI 30.38 kg/m     Wt Readings from Last 3 Encounters:  12/21/15 194 lb (88 kg)  11/26/15 197 lb 1.5 oz (89.4 kg)  10/20/15 218 lb 6.4 oz (99.1 kg)     Physical Exam  Constitutional: He is oriented to person, place, and time. He appears well-developed and well-nourished. No distress.  HENT:  Head: Normocephalic and atraumatic.  Eyes: No scleral icterus.  Neck: No JVD present.  No JVD at 90 degrees  Cardiovascular: Normal rate, regular rhythm and normal heart sounds.   No murmur heard. Pulmonary/Chest: Effort normal. He has no wheezes. He has no rales.  Median sternotomy well healed without erythema or d/c  Abdominal: Soft. There is no tenderness.  Musculoskeletal: He exhibits edema.  Trace to 1+ bilateral LE edema  Neurological: He is alert and oriented to person, place, and time.  Skin: Skin is warm and dry.  Psychiatric: He has a normal mood and affect.    ASSESSMENT:    1. NSTEMI (non-ST elevated myocardial infarction) ([place])   2. Coronary artery disease involving native coronary artery of native heart without angina pectoris   3. Persistent atrial fibrillation ([place])   4. Chronic diastolic CHF (congestive heart failure) ([place])   5. Benign essential hypertension   6. Pure hypercholesterolemia   7. CKD (chronic kidney disease) stage 3, GFR 30-59 ml/min   8. History of tracheostomy    PLAN:    In order of problems listed above:  1. S/p NSTEMI - MI was in the setting of AF with RVR with critical LM stenosis noted on LHC.  Now, he is s/p CABG.  As he is on Coumadin, dual antiplatelet Rx is not indicated.  Continue ASA 325 QD for now.  At 90 days post CABG, can consider reducing ASA to 81 QD.  Continue beta-blocker, statin.  He is currently at home with HHPT. Once his PT is  complete, will need to refer him to cardiac rehabilitation.  2. CAD - s/p emergent CABG.  He had a prolonged course and is now at home.  He is working with Jeff and is progressing nicely, but slowly.  Continue ASA, statin, beta-blocker, angiotensin receptor blocker.  3. Persistent AF - CHADS2-VASc=6 (age > 13, vascular dz, HTN, DM, CHF).  He will need long term anticoagulation.  His HR is controlled.  It is difficult to know if he is symptomatic due to deconditioning from his CABG.  However, it is worth trying DCCV at least once to see if he can maintain NSR.   INR was therapeutic on 12/12/15. Last INR on 12/16/15 was 2.5. I will request that his PCP check his INR weekly until follow-up. If he is still in atrial fibrillation at follow-up, consider proceeding with elective cardioversion.  4. Diastolic HF - Volume appears to be stable on low-dose Lasix. Continue current therapy.  5. HTN - Blood pressure is well controlled. Continue current regimen.  6. HL - Given recent non-STEMI resulting in CABG, he should be on high-dose statin therapy.   -  DC Zocor.   -  Start atorvastatin 80 mg daily.   -  Obtain follow-up lipids and LFTs in 2 months.  7. CKD - Followed by Nephrology.  Last creatinine 12/14/15 was 1.25.  8. Hx of Tracheostomy -  This has been removed.   Medication Adjustments/Labs and Tests Ordered: Current medicines are reviewed at length with the patient today.  Concerns regarding medicines are outlined above.  Medication changes, Labs and Tests ordered today are outlined in the Patient Instructions noted below. Patient Instructions  Medication Instructions:  STOP taking Zocor (Simvastatin) START taking Atorvastatin 80 mg Once daily   Labwork: In 2 months - FASTING Lipids and LFTs 02/21/16 LAB OPENS AT 7:30 AM  Testing/Procedures: None   Follow-Up: Dr. Jenkins Rivers in 4 weeks (ok to see Mark Dopp, PA-C on a day Dr. Jenkins Rivers is in the office).  Any Other Special  Instructions Will Be Listed Below (If Applicable). I would like your primary care doctor to check your INR (Coumadin level) once a week for now.  Please have her fax a copy of each test.  When we see you back, we may decide to schedule a Cardioversion.  This is a procedure to put your heart back in normal rhythm.  We need your INR to be greater than 2 for at least a month to do this procedure.  If you need a refill on your cardiac medications before your next appointment, please call your pharmacy.   Signed, Mark Dopp, PA-C  12/21/2015 4:55 PM    [facility] [address], [place], [place]  [address] Phone: [phone]; Fax: [phone]

## 2015-12-22 ENCOUNTER — Ambulatory Visit: Payer: Medicare Other | Admitting: Cardiothoracic Surgery

## 2015-12-22 ENCOUNTER — Ambulatory Visit (INDEPENDENT_AMBULATORY_CARE_PROVIDER_SITE_OTHER): Payer: Self-pay | Admitting: Cardiothoracic Surgery

## 2015-12-22 ENCOUNTER — Encounter: Payer: Self-pay | Admitting: Cardiothoracic Surgery

## 2015-12-22 ENCOUNTER — Ambulatory Visit
Admission: RE | Admit: 2015-12-22 | Discharge: 2015-12-22 | Disposition: A | Discharge status: Home / Self Care | Payer: Medicare Other | Source: Ambulatory Visit | Attending: Cardiothoracic Surgery | Admitting: Cardiothoracic Surgery

## 2015-12-22 ENCOUNTER — Other Ambulatory Visit: Payer: Self-pay | Admitting: Cardiothoracic Surgery

## 2015-12-22 VITALS — BP 139/80 | HR 82 | Resp 18 | Ht 67.0 in | Wt 194.0 lb

## 2015-12-22 DIAGNOSIS — Z951 Presence of aortocoronary bypass graft: Secondary | ICD-10-CM

## 2015-12-22 DIAGNOSIS — I481 Persistent atrial fibrillation: Secondary | ICD-10-CM

## 2015-12-22 DIAGNOSIS — I2510 Atherosclerotic heart disease of native coronary artery without angina pectoris: Secondary | ICD-10-CM

## 2015-12-22 DIAGNOSIS — I214 Non-ST elevation (NSTEMI) myocardial infarction: Secondary | ICD-10-CM

## 2015-12-22 DIAGNOSIS — I4819 Other persistent atrial fibrillation: Secondary | ICD-10-CM

## 2015-12-22 NOTE — Progress Notes (Signed)
PCP is [name]., MD Referring Provider is [name], MD  Chief Complaint  Patient presents with  . Routine Post Op    s/p EMERGENT CABG 10/26/15 after stay in LTAC    HPI: First postop visit after undergoing emergency CABG 3 in mid October when he presented with acute MI and severe left main disease. The patient had a long recovery, located by acute on chronic respiratory failure requiring prolonged ventilation and tracheostomy, MRSA bacteremia and acute renal insufficiency, delirium and severe deconditioning. His postop echo was normal. He had postop atrial fibrillation and was started on Coumadin. When his acute medical problems improved and he needed significant rehabilitation [facility] where he received physical therapy and the tracheostomy was weaned and then removed. He was then discharged home with family and home physical therapy. He has continued his Coumadin. He is walking with a walker. He is on room air and looks incredibly well. Chest x-ray taken today's clear.  Past Medical History:  Diagnosis Date  . Allergy   . Back pain   . Chronic diastolic CHF (congestive heart failure) ([place]) 12/21/2015   A. Echo 10/17: Moderate LVH, EF 55-60, normal wall motion, grade 2 diastolic dysfunction, mild aortic stenosis (mean 14, peak 34), MAC, mild LAE    . CKD (chronic kidney disease) stage 3, GFR 30-59 ml/min 03/04/2014  . Coronary artery disease    a. s/p remote POBA in 1988 // b. NSTEMI in 10/17 >> LHC with 99% LM stenosis >> emergent CABG (L-LAD, S-OM2, S-PDA) - post op course complicated (prolonged ventilation, s/p Trach, AFib, anemia req transfusion, bacteremia >> DC to LTAC)  . Diabetes mellitus   . GERD (gastroesophageal reflux disease)   . History of nuclear stress test    a. Myoview 6/14 - EF 51, no ischemia or scar; Low Risk  . Hyperlipemia 09/11/2013   Last Assessment & Plan:  Lipid abnormalities are improving with  treatment. Nutritional counseling was provided. and Pharmacotherapy as ordered. Lipids will be reassessed in 3 months.  . Hypertension   . Neuropathy ([place])   . Persistent atrial fibrillation ([place]) 10/25/2015    Past Surgical History:  Procedure Laterality Date  . CARDIAC CATHETERIZATION N/A 10/26/2015   Procedure: Left Heart Cath and Coronary Angiography;  Surgeon: Troy Sine, MD;  Location: [place] [facility];  Service: Cardiovascular;  Laterality: N/A;  . cataract surg     bil/2008  . COLONOSCOPY    . CORONARY ARTERY BYPASS GRAFT N/A 10/26/2015   Procedure: CORONARY ARTERY BYPASS GRAFTING (CABG) x3(LIMA to LA, SVG to OM1, SVG to PDA) with EVH from the left thigh and partial lower leg greater saphenous vein and left internal mammary artery;  Surgeon: Ivin Poot, MD;  Location: [place];  Service: Open Heart Surgery;  Laterality: N/A;  . CORONARY STENT PLACEMENT     1988  . TEE WITHOUT CARDIOVERSION N/A 10/26/2015   Procedure: TRANSESOPHAGEAL ECHOCARDIOGRAM (TEE);  Surgeon: Ivin Poot, MD;  Location: [place];  Service: Open Heart Surgery;  Laterality: N/A;  . TONSILLECTOMY      Family History  Problem Relation Age of Onset  . Diabetes Father   . Colon cancer Neg Hx   . Esophageal cancer Neg Hx   . Stomach cancer Neg Hx   . Rectal cancer Neg Hx   . Allergic rhinitis Neg Hx   . Angioedema Neg Hx   . Asthma Neg Hx   .  Eczema Neg Hx   . Immunodeficiency Neg Hx   . Urticaria Neg Hx     Social History Social History  Substance Use Topics  . Smoking status: Former Smoker    Packs/day: 2.00    Years: 30.00    Types: Cigarettes    Quit date: 06/28/2004  . Smokeless tobacco: Never Used  . Alcohol use No     Comment: QUIT 06/28/2004    Current Outpatient Prescriptions  Medication Sig Dispense Refill  . amLODipine (NORVASC) 10 MG tablet TAKE 1 TABLET (10 MG TOTAL) BY MOUTH DAILY. 90 tablet 2  . aspirin EC 325 MG tablet Take 325 mg by mouth daily.    [name] atorvastatin  (LIPITOR) 80 MG tablet Take 1 tablet (80 mg total) by mouth daily. 90 tablet 3  . budesonide (PULMICORT) 0.5 MG/2ML nebulizer solution Take 0.5 mg by nebulization daily.     . carvedilol (COREG) 3.125 MG tablet Take 1 tablet (3.125 mg total) by mouth 2 (two) times daily with a meal. 180 tablet 3  . cholecalciferol (VITAMIN D) 1000 units tablet Take 1,000 Units by mouth daily.    . diphenhydramine-acetaminophen (TYLENOL PM) 25-500 MG TABS tablet Take 2 tablets by mouth at bedtime as needed (for sleep).     . fenofibrate 54 MG tablet Take 54 mg by mouth daily.    . furosemide (LASIX) 20 MG tablet TAKE 1 TABLET (20 MG TOTAL) BY MOUTH DAILY. 90 tablet 0  . Glucosamine-Chondroit-Vit C-Mn (GLUCOSAMINE CHONDR 1500 COMPLX PO) Take 2 tablets by mouth daily.     . hydrALAZINE (APRESOLINE) 50 MG tablet Take 75 mg by mouth 3 (three) times daily.    . isosorbide mononitrate (IMDUR) 30 MG 24 hr tablet Take 1 tablet (30 mg total) by mouth daily. 90 tablet 3  . levocetirizine (XYZAL) 5 MG tablet Take 0.5 tablets (2.5 mg total) by mouth every evening. 30 tablet 5  . losartan (COZAAR) 100 MG tablet Take 1 tablet (100 mg total) by mouth daily. 90 tablet 2  . niacin (NIASPAN) 1000 MG CR tablet Take 1,000 mg by mouth at bedtime.    Nelva Nay SOLOSTAR 300 UNIT/ML SOPN Inject 30 Units into the skin at bedtime.     . traZODone (DESYREL) 50 MG tablet Take 100 mg by mouth at bedtime.     [name] warfarin (COUMADIN) 1 MG tablet Take 3 mg by mouth daily at 6 PM.     No current facility-administered medications for this visit.     Allergies  Allergen Reactions  . Amoxicillin Other (See Comments)    PATIENT PASSES OUT!!!!  . Cephalexin Other (See Comments)    PATIENT PASSES OUT!!!!  . Penicillin G Other (See Comments)    Has patient had a PCN reaction causing immediate rash, facial/tongue/throat swelling, SOB or lightheadedness with hypotension: Yes Has patient had a PCN reaction causing severe rash involving mucus membranes  or skin necrosis: No Has patient had a PCN reaction that required hospitalization: Yes Has patient had a PCN reaction occurring within the last 10 years: Yes If all of the above answers are "NO", then may proceed with Cephalosporin use. PATIENT PASSES OUT!!!!    Review of Systems   Patient appears overall weak and deconditioned but is alert and appropriate No problems with fever or productive cough Surgical incisions are healed well  BP 139/80   Pulse 82 Comment: irreg  Resp 18   Ht 5\' 7"  (1.702 m)   Wt 194 lb (88 kg)  SpO2 97% Comment: ON RA  BMI 30.38 kg/m  Physical Exam     Physical Exam  General:  HEENT: Normocephalic pupils equal , dentition adequate Neck: Supple without JVD, adenopathy, or bruit tracheostomy site with small eschar which was debrided and then treated with topical Silver nitrate. Neosporin Band-Aid dressing applied Chest: Clear to auscultation, symmetrical breath sounds, no rhonchi, no tenderness             or deformity Cardiovascular: Irregular rhythm of A. fib r, no murmur, no gallop, peripheral pulses             palpable in all extremities Abdomen:  Soft, nontender, no palpable mass or organomegaly Extremities: Warm, well-perfused, no clubbing cyanosis edema or tenderness,              no venous stasis changes of the legs Rectal/GU: Deferred Neuro: Grossly non--focal and symmetrical throughout Skin: Clean and dry without rash or ulceration   Diagnostic Tests: Chest x-ray clear  Impression: Excellent recovery after a long illness after emergency CABG Continue home therapy PT, continue Coumadin for A. fib with possible cardioversion by cardiology next month Plan: Patient understands he should try to walk 15-20 minutes daily. Patient understands he should lift no more than 15 pounds until 3 months after date of surgery. It appears that the patient's family his on-site and helping him which he will need for his safety for the next 2-3  weeks.  Patient will be  followed by cardiology and return here as needed Len Childs, MD [facility] [phone]

## 2015-12-24 ENCOUNTER — Ambulatory Visit (INDEPENDENT_AMBULATORY_CARE_PROVIDER_SITE_OTHER): Payer: Medicare Other | Admitting: Pharmacist Clinician (PhC)/ Clinical Pharmacy Specialist

## 2015-12-24 ENCOUNTER — Telehealth: Payer: Self-pay | Admitting: *Deleted

## 2015-12-24 DIAGNOSIS — I4819 Other persistent atrial fibrillation: Secondary | ICD-10-CM

## 2015-12-24 DIAGNOSIS — I481 Persistent atrial fibrillation: Secondary | ICD-10-CM

## 2015-12-24 DIAGNOSIS — Z7901 Long term (current) use of anticoagulants: Secondary | ICD-10-CM

## 2015-12-24 DIAGNOSIS — Z5181 Encounter for therapeutic drug level monitoring: Secondary | ICD-10-CM

## 2015-12-24 LAB — POCT INR: INR: 1.9

## 2015-12-24 NOTE — Telephone Encounter (Signed)
[name] , RN from [facility] in regards to pt INR. Richardson Dopp, [place] saw pt on 12/21/15 which he asked for PCP to monitor INR pending possible DCCV. Janett Billow states PCP said they are not managing INR and that Cardiology is. I placed Janett Billow on hold for a brief moment to d/w Richardson Dopp, PA as to further recommendations for pt's INR. Brynda Rim. PA states have RN check INR today while with the pt and call our [facility]. I advised Janett Billow of these recommendations, which she is agreeable to this plan of care for the pt. I advised Janett Billow I will d/w Pharm D in our [facility] to make them aware that we will now be checking pt's INR. [name], RN cell # [phone] she is with [facility]. I s/w [place] Pellant D today in our office and she is aware Janett Billow will be calling her in regards to pt's INR. Both Janett Billow and [place] verbalized plan of care for the pt.

## 2015-12-28 ENCOUNTER — Telehealth: Payer: Self-pay | Admitting: Cardiovascular Disease

## 2015-12-28 MED ORDER — CARVEDILOL 6.25 MG PO TABS: 6.2500 mg | ORAL_TABLET | Freq: Two times a day (BID) | ORAL | 3 refills | Status: DC

## 2015-12-28 MED ORDER — CARVEDILOL 3.125 MG PO TABS: 3.1250 mg | ORAL_TABLET | Freq: Two times a day (BID) | ORAL | 3 refills | Status: DC

## 2015-12-28 NOTE — Telephone Encounter (Signed)
New message:    Pt resting oxygen at rest is 92%After walking him about 120 feet his heart rate went from 80 to 144.When he was resting it would go back down to 90,oxygen stayed the same. Please call to advise.

## 2015-12-28 NOTE — Telephone Encounter (Signed)
Patient informed and verbalized understanding of plan. Message left for Beth.

## 2015-12-28 NOTE — Telephone Encounter (Signed)
S/w Beth, PTA with [facility] to go over recommendations per Brynda Rim. PA to increase Coreg to 6.25 mg BID and to call if SBP falls < 100. Beth verbalized understanding to plan of care for the pt. I did advise Beth that I will also send in a new Rx for the pt; Coreg 6.25 mg BID. Beth agreeable to plan of care.

## 2015-12-28 NOTE — Telephone Encounter (Signed)
Patient called to say that he has not been taking carvedilol 3.125 mg twice daily. Richardson Dopp advised and suggest patient restart carvedilol 3.125 mg twice daily. Patient informed and verbalized understanding of plan.

## 2015-12-28 NOTE — Telephone Encounter (Signed)
Follow Up:    Pt says he need to talk to you again,he just finished talking to you.

## 2015-12-28 NOTE — Telephone Encounter (Signed)
Patient was seen by Eustaquio Maize a PTA with [facility] for the first time today and she called to report O2 sat of 92% & HR 90 prior to walking 120 ft and afterwards HR increased to 144 & O2 sat stayed at 92% with seated rest break. No c/o chest pain, dizziness. Patient did become sob when walking. Per PTA, patient was fatigue because he didn't sleep well last night due to caring for his demented wife. No c/o cough, n/v, edema, fever, chills. Per PTA, patient is eating or drinking ok. Per Eustaquio Maize, she will return to home for next visit with patient on Thursday. Bethe advised to continue monitoring and call office with follow up on Thursday.

## 2015-12-28 NOTE — Telephone Encounter (Signed)
Increase Coreg to 6.25 mg Twice daily  Call if SBP drops below 100. Richardson Dopp, PA-C   12/28/2015 1:05 PM

## 2015-12-28 NOTE — Telephone Encounter (Signed)
I had s/w Beth PTA w/[facility] who is aware of the Coreg dose change and new Rx was sent to Pharmacy. Alma Friendly, LPN states she s/w pt already about the dose change in the Coreg, though she lmom for Beth to go over change, which I stated I already s/w Beth. All involved have been informed.

## 2015-12-29 ENCOUNTER — Ambulatory Visit: Payer: Medicare Other | Admitting: Allergy and Immunology

## 2015-12-31 ENCOUNTER — Telehealth: Payer: Self-pay | Admitting: Physician Assistant

## 2015-12-31 NOTE — Telephone Encounter (Signed)
HHRN called because she was unable to get an INR on the portable machine. She tried 3 times, and got an air message. She was concerned that this meant that his INR was too high. Therefore, she due to venipuncture and sent that off. The results will not be back for several hours. She wanted to know what to do about the Coumadin.  His Coumadin has not been that hard to control in the past. He is not having any bleeding issues or other concerns. Therefore, I told her she should get him to take a half dose of his Coumadin tonight. We will get the lab results tomorrow and will be able to further manage his Coumadin then.  She was in agreement with that, and stated she would follow up on the INR tomorrow.  Lenoard Aden 12/31/2015 7:33 PM Beeper [phone]

## 2016-01-01 ENCOUNTER — Telehealth: Payer: Self-pay | Admitting: Cardiology

## 2016-01-01 ENCOUNTER — Other Ambulatory Visit: Payer: Self-pay | Admitting: Cardiology

## 2016-01-01 MED ORDER — WARFARIN SODIUM 1 MG PO TABS: 3.0000 mg | ORAL_TABLET | Freq: Every day | ORAL | 6 refills | Status: DC

## 2016-01-01 NOTE — Telephone Encounter (Signed)
Pt's INR 2.8 yesterday and he took 1.5 mg yesterday.  I refilled meds and he will continue current dose regular dose and repeat INR on Tuesday.

## 2016-01-04 ENCOUNTER — Ambulatory Visit (INDEPENDENT_AMBULATORY_CARE_PROVIDER_SITE_OTHER): Payer: Medicare Other | Admitting: Internal Medicine

## 2016-01-04 DIAGNOSIS — Z5181 Encounter for therapeutic drug level monitoring: Secondary | ICD-10-CM

## 2016-01-04 DIAGNOSIS — Z7901 Long term (current) use of anticoagulants: Secondary | ICD-10-CM

## 2016-01-04 DIAGNOSIS — I4819 Other persistent atrial fibrillation: Secondary | ICD-10-CM

## 2016-01-04 DIAGNOSIS — I481 Persistent atrial fibrillation: Secondary | ICD-10-CM

## 2016-01-04 LAB — POCT INR: INR: 2.2

## 2016-01-04 NOTE — Telephone Encounter (Signed)
See anticoag note from 12/26 for additional dosing and instructions.

## 2016-01-09 ENCOUNTER — Emergency Department (HOSPITAL_BASED_OUTPATIENT_CLINIC_OR_DEPARTMENT_OTHER): Payer: Medicare Other

## 2016-01-09 ENCOUNTER — Encounter (HOSPITAL_BASED_OUTPATIENT_CLINIC_OR_DEPARTMENT_OTHER): Payer: Self-pay | Admitting: Emergency Medicine

## 2016-01-09 ENCOUNTER — Inpatient Hospital Stay (HOSPITAL_BASED_OUTPATIENT_CLINIC_OR_DEPARTMENT_OTHER)
Admission: EM | Admit: 2016-01-09 | Discharge: 2016-01-12 | DRG: 292 | Disposition: A | Discharge status: Home / Self Care | Payer: Medicare Other | Attending: Cardiovascular Disease | Admitting: Cardiovascular Disease

## 2016-01-09 DIAGNOSIS — R262 Difficulty in walking, not elsewhere classified: Secondary | ICD-10-CM

## 2016-01-09 DIAGNOSIS — N179 Acute kidney failure, unspecified: Secondary | ICD-10-CM | POA: Diagnosis not present

## 2016-01-09 DIAGNOSIS — I252 Old myocardial infarction: Secondary | ICD-10-CM

## 2016-01-09 DIAGNOSIS — Z7901 Long term (current) use of anticoagulants: Secondary | ICD-10-CM | POA: Diagnosis not present

## 2016-01-09 DIAGNOSIS — I5043 Acute on chronic combined systolic (congestive) and diastolic (congestive) heart failure: Secondary | ICD-10-CM | POA: Diagnosis present

## 2016-01-09 DIAGNOSIS — I5032 Chronic diastolic (congestive) heart failure: Secondary | ICD-10-CM

## 2016-01-09 DIAGNOSIS — I5033 Acute on chronic diastolic (congestive) heart failure: Secondary | ICD-10-CM | POA: Diagnosis present

## 2016-01-09 DIAGNOSIS — Z79899 Other long term (current) drug therapy: Secondary | ICD-10-CM | POA: Diagnosis not present

## 2016-01-09 DIAGNOSIS — I5041 Acute combined systolic (congestive) and diastolic (congestive) heart failure: Secondary | ICD-10-CM

## 2016-01-09 DIAGNOSIS — Z7951 Long term (current) use of inhaled steroids: Secondary | ICD-10-CM | POA: Diagnosis not present

## 2016-01-09 DIAGNOSIS — R791 Abnormal coagulation profile: Secondary | ICD-10-CM | POA: Diagnosis present

## 2016-01-09 DIAGNOSIS — I10 Essential (primary) hypertension: Secondary | ICD-10-CM | POA: Diagnosis not present

## 2016-01-09 DIAGNOSIS — I110 Hypertensive heart disease with heart failure: Principal | ICD-10-CM | POA: Diagnosis present

## 2016-01-09 DIAGNOSIS — I509 Heart failure, unspecified: Secondary | ICD-10-CM | POA: Insufficient documentation

## 2016-01-09 DIAGNOSIS — R0602 Shortness of breath: Secondary | ICD-10-CM | POA: Insufficient documentation

## 2016-01-09 DIAGNOSIS — E119 Type 2 diabetes mellitus without complications: Secondary | ICD-10-CM

## 2016-01-09 DIAGNOSIS — Z880 Allergy status to penicillin: Secondary | ICD-10-CM | POA: Diagnosis not present

## 2016-01-09 DIAGNOSIS — Z833 Family history of diabetes mellitus: Secondary | ICD-10-CM | POA: Diagnosis not present

## 2016-01-09 DIAGNOSIS — E8770 Fluid overload, unspecified: Secondary | ICD-10-CM

## 2016-01-09 DIAGNOSIS — I481 Persistent atrial fibrillation: Secondary | ICD-10-CM | POA: Diagnosis present

## 2016-01-09 DIAGNOSIS — E1159 Type 2 diabetes mellitus with other circulatory complications: Secondary | ICD-10-CM | POA: Diagnosis not present

## 2016-01-09 DIAGNOSIS — I5023 Acute on chronic systolic (congestive) heart failure: Secondary | ICD-10-CM

## 2016-01-09 DIAGNOSIS — E7800 Pure hypercholesterolemia, unspecified: Secondary | ICD-10-CM | POA: Diagnosis present

## 2016-01-09 DIAGNOSIS — F17210 Nicotine dependence, cigarettes, uncomplicated: Secondary | ICD-10-CM | POA: Diagnosis present

## 2016-01-09 DIAGNOSIS — Z951 Presence of aortocoronary bypass graft: Secondary | ICD-10-CM

## 2016-01-09 DIAGNOSIS — Z881 Allergy status to other antibiotic agents status: Secondary | ICD-10-CM

## 2016-01-09 DIAGNOSIS — I4819 Other persistent atrial fibrillation: Secondary | ICD-10-CM | POA: Diagnosis present

## 2016-01-09 DIAGNOSIS — R9431 Abnormal electrocardiogram [ECG] [EKG]: Secondary | ICD-10-CM | POA: Diagnosis not present

## 2016-01-09 DIAGNOSIS — I2510 Atherosclerotic heart disease of native coronary artery without angina pectoris: Secondary | ICD-10-CM | POA: Diagnosis present

## 2016-01-09 DIAGNOSIS — Z7982 Long term (current) use of aspirin: Secondary | ICD-10-CM | POA: Diagnosis not present

## 2016-01-09 LAB — CBC WITH DIFFERENTIAL/PLATELET
BAND NEUTROPHILS: 0 %
BASOS ABS: 0.2 10*3/uL — AB (ref 0.0–0.1)
BASOS PCT: 2 %
BLASTS: 0 %
Eosinophils Absolute: 0.7 10*3/uL (ref 0.0–0.7)
Eosinophils Relative: 6 %
HEMATOCRIT: 30.5 % — AB (ref 39.0–52.0)
HEMOGLOBIN: 9.2 g/dL — AB (ref 13.0–17.0)
LYMPHS ABS: 1.5 10*3/uL (ref 0.7–4.0)
LYMPHS PCT: 12 %
MCH: 29.2 pg (ref 26.0–34.0)
MCHC: 30.2 g/dL (ref 30.0–36.0)
MCV: 96.8 fL (ref 78.0–100.0)
MONOS PCT: 7 %
Metamyelocytes Relative: 0 %
Monocytes Absolute: 0.9 10*3/uL (ref 0.1–1.0)
Myelocytes: 0 %
NEUTROS ABS: 9 10*3/uL — AB (ref 1.7–7.7)
NEUTROS PCT: 73 %
NRBC: 0 /100{WBCs}
Platelets: 401 10*3/uL — ABNORMAL HIGH (ref 150–400)
Promyelocytes Absolute: 0 %
RBC: 3.15 MIL/uL — ABNORMAL LOW (ref 4.22–5.81)
RDW: 17.7 % — AB (ref 11.5–15.5)
WBC: 12.3 10*3/uL — ABNORMAL HIGH (ref 4.0–10.5)

## 2016-01-09 LAB — PROTIME-INR
INR: 2.18
PROTHROMBIN TIME: 24.6 s — AB (ref 11.4–15.2)

## 2016-01-09 LAB — COMPREHENSIVE METABOLIC PANEL
ALBUMIN: 3.3 g/dL — AB (ref 3.5–5.0)
ALK PHOS: 80 U/L (ref 38–126)
ALT: 19 U/L (ref 17–63)
ANION GAP: 7 (ref 5–15)
AST: 25 U/L (ref 15–41)
BILIRUBIN TOTAL: 0.3 mg/dL (ref 0.3–1.2)
BUN: 23 mg/dL — ABNORMAL HIGH (ref 6–20)
CALCIUM: 9 mg/dL (ref 8.9–10.3)
CO2: 25 mmol/L (ref 22–32)
Chloride: 109 mmol/L (ref 101–111)
Creatinine, Ser: 1.04 mg/dL (ref 0.61–1.24)
GLUCOSE: 138 mg/dL — AB (ref 65–99)
POTASSIUM: 4.3 mmol/L (ref 3.5–5.1)
Sodium: 141 mmol/L (ref 135–145)
TOTAL PROTEIN: 7.3 g/dL (ref 6.5–8.1)

## 2016-01-09 LAB — TROPONIN I: TROPONIN I: 0.06 ng/mL — AB (ref <0.03)

## 2016-01-09 LAB — BRAIN NATRIURETIC PEPTIDE: B NATRIURETIC PEPTIDE 5: 404.3 pg/mL — AB (ref 0.0–100.0)

## 2016-01-09 MED ORDER — AMLODIPINE BESYLATE 5 MG PO TABS
5.0000 mg | ORAL_TABLET | Freq: Every day | ORAL | Status: DC
  Administered 2016-01-10 – 2016-01-12 (×3): 5 mg via ORAL
  Filled 2016-01-09 (×3): qty 1

## 2016-01-09 MED ORDER — INSULIN GLARGINE 100 UNIT/ML ~~LOC~~ SOLN
30.0000 [IU] | Freq: Every day | SUBCUTANEOUS | Status: DC
  Administered 2016-01-10 – 2016-01-11 (×3): 30 [IU] via SUBCUTANEOUS
  Filled 2016-01-09 (×4): qty 0.3

## 2016-01-09 MED ORDER — ISOSORBIDE MONONITRATE ER 30 MG PO TB24
30.0000 mg | ORAL_TABLET | Freq: Every day | ORAL | Status: DC
  Administered 2016-01-10 – 2016-01-12 (×3): 30 mg via ORAL
  Filled 2016-01-09 (×3): qty 1

## 2016-01-09 MED ORDER — ONDANSETRON HCL 4 MG/2ML IJ SOLN: 4.0000 mg | Freq: Four times a day (QID) | INTRAMUSCULAR | Status: DC | PRN

## 2016-01-09 MED ORDER — ALBUTEROL SULFATE (2.5 MG/3ML) 0.083% IN NEBU
INHALATION_SOLUTION | RESPIRATORY_TRACT | Status: AC
  Administered 2016-01-09: 2.5 mg
  Filled 2016-01-09: qty 3

## 2016-01-09 MED ORDER — LORATADINE 10 MG PO TABS
5.0000 mg | ORAL_TABLET | Freq: Every evening | ORAL | Status: DC
  Administered 2016-01-10 – 2016-01-12 (×4): 5 mg via ORAL
  Filled 2016-01-09 (×4): qty 1

## 2016-01-09 MED ORDER — FUROSEMIDE 10 MG/ML IJ SOLN
40.0000 mg | Freq: Once | INTRAMUSCULAR | Status: AC
  Administered 2016-01-09: 40 mg via INTRAVENOUS
  Filled 2016-01-09: qty 4

## 2016-01-09 MED ORDER — SODIUM CHLORIDE 0.9% FLUSH: 3.0000 mL | INTRAVENOUS | Status: DC | PRN

## 2016-01-09 MED ORDER — BUDESONIDE 0.5 MG/2ML IN SUSP
0.5000 mg | Freq: Every day | RESPIRATORY_TRACT | Status: DC
  Administered 2016-01-10 – 2016-01-12 (×3): 0.5 mg via RESPIRATORY_TRACT
  Filled 2016-01-09 (×3): qty 2

## 2016-01-09 MED ORDER — NIACIN ER (ANTIHYPERLIPIDEMIC) 500 MG PO TBCR
1000.0000 mg | EXTENDED_RELEASE_TABLET | Freq: Every day | ORAL | Status: DC
Start: 2016-01-09 — End: 2016-01-12
  Administered 2016-01-09 – 2016-01-11 (×3): 1000 mg via ORAL
  Filled 2016-01-09 (×4): qty 2

## 2016-01-09 MED ORDER — WARFARIN - PHYSICIAN DOSING INPATIENT
Freq: Every day | Status: DC
Start: 2016-01-10 — End: 2016-01-12
  Administered 2016-01-10 – 2016-01-12 (×3)

## 2016-01-09 MED ORDER — SODIUM CHLORIDE 0.9 % IV SOLN: 250.0000 mL | INTRAVENOUS | Status: DC | PRN

## 2016-01-09 MED ORDER — WARFARIN SODIUM 3 MG PO TABS
3.0000 mg | ORAL_TABLET | Freq: Every day | ORAL | Status: DC
  Administered 2016-01-10 – 2016-01-11 (×3): 3 mg via ORAL
  Filled 2016-01-09 (×3): qty 1

## 2016-01-09 MED ORDER — IPRATROPIUM-ALBUTEROL 0.5-2.5 (3) MG/3ML IN SOLN
RESPIRATORY_TRACT | Status: AC
  Administered 2016-01-09: 3 mL
  Filled 2016-01-09: qty 3

## 2016-01-09 MED ORDER — FUROSEMIDE 10 MG/ML IJ SOLN
40.0000 mg | Freq: Two times a day (BID) | INTRAMUSCULAR | Status: AC
Start: 2016-01-10 — End: 2016-01-10
  Administered 2016-01-10 (×2): 40 mg via INTRAVENOUS
  Filled 2016-01-09 (×2): qty 4

## 2016-01-09 MED ORDER — SODIUM CHLORIDE 0.9% FLUSH
3.0000 mL | Freq: Two times a day (BID) | INTRAVENOUS | Status: DC
  Administered 2016-01-10 – 2016-01-12 (×6): 3 mL via INTRAVENOUS

## 2016-01-09 MED ORDER — DIPHENHYDRAMINE-APAP (SLEEP) 25-500 MG PO TABS: 2.0000 | ORAL_TABLET | Freq: Every evening | ORAL | Status: DC | PRN

## 2016-01-09 MED ORDER — LOSARTAN POTASSIUM 50 MG PO TABS
100.0000 mg | ORAL_TABLET | Freq: Every day | ORAL | Status: DC
  Administered 2016-01-10 – 2016-01-12 (×3): 100 mg via ORAL
  Filled 2016-01-09 (×3): qty 2

## 2016-01-09 MED ORDER — VITAMIN D 1000 UNITS PO TABS
1000.0000 [IU] | ORAL_TABLET | Freq: Every day | ORAL | Status: DC
  Administered 2016-01-10 – 2016-01-12 (×3): 1000 [IU] via ORAL
  Filled 2016-01-09 (×3): qty 1

## 2016-01-09 MED ORDER — TRAZODONE HCL 100 MG PO TABS
100.0000 mg | ORAL_TABLET | Freq: Every day | ORAL | Status: DC
  Administered 2016-01-10 – 2016-01-11 (×3): 100 mg via ORAL
  Filled 2016-01-09 (×3): qty 1

## 2016-01-09 MED ORDER — CARVEDILOL 3.125 MG PO TABS
3.1250 mg | ORAL_TABLET | Freq: Two times a day (BID) | ORAL | Status: DC
  Administered 2016-01-10: 3.125 mg via ORAL
  Filled 2016-01-09: qty 1

## 2016-01-09 MED ORDER — ACETAMINOPHEN 325 MG PO TABS: 650.0000 mg | ORAL_TABLET | ORAL | Status: DC | PRN

## 2016-01-09 MED ORDER — HYDRALAZINE HCL 50 MG PO TABS
75.0000 mg | ORAL_TABLET | Freq: Three times a day (TID) | ORAL | Status: DC
  Administered 2016-01-10 – 2016-01-12 (×9): 75 mg via ORAL
  Filled 2016-01-09 (×9): qty 1

## 2016-01-09 MED ORDER — ATORVASTATIN CALCIUM 80 MG PO TABS
80.0000 mg | ORAL_TABLET | Freq: Every day | ORAL | Status: DC
  Administered 2016-01-10 – 2016-01-12 (×3): 80 mg via ORAL
  Filled 2016-01-09 (×3): qty 1

## 2016-01-09 MED ORDER — FENOFIBRATE 54 MG PO TABS
54.0000 mg | ORAL_TABLET | Freq: Every day | ORAL | Status: DC
  Administered 2016-01-10 – 2016-01-12 (×3): 54 mg via ORAL
  Filled 2016-01-09 (×3): qty 1

## 2016-01-09 MED ORDER — ASPIRIN EC 325 MG PO TBEC
325.0000 mg | DELAYED_RELEASE_TABLET | Freq: Every day | ORAL | Status: DC
  Administered 2016-01-10 – 2016-01-12 (×3): 325 mg via ORAL
  Filled 2016-01-09 (×3): qty 1

## 2016-01-09 NOTE — ED Notes (Signed)
Portable cxr in progress. Pt is alert and cooperative with care.

## 2016-01-09 NOTE — ED Notes (Signed)
[facility] is on the way to transport patient to room 3E27 at [facility]

## 2016-01-09 NOTE — ED Provider Notes (Signed)
[facility] DEPT [facility] Provider Note   CSN: [id number] Arrival date & time: 01/09/16  1413  By signing my name below, I, Kadyn Whitney, attest that this documentation has been prepared under the direction and in the presence of physician practitioner, Julianne Rice, MD. Electronically Signed: Dora Sims, Scribe. 01/09/2016. 3:08 PM.  History   Chief Complaint Chief Complaint  Patient presents with  . Shortness of Breath    The history is provided by the patient. No language interpreter was used.     HPI Comments: Mark Rivers is a 81 y.o. male brought in by family, with extensive PMHx, including chronic CHF, DM, HTN, HLD, NSTEMI, and respiratory failure, who presents to the Emergency Department complaining of acute on chronic, worsening SOB for the last several weeks. He had triple bypass surgery on 10/27/15 and went home on 12/18/15; his son states nurses have come to their house regularly to check on him. Pt states he had some SOB at baseline before his triple bypass surgery, but it has been worse since he came home. He states he has become SOB at rest for the last few weeks which is unusual, and becomes severely SOB with light exertion. He notes a persistent cough over the last few weeks which is occasionally productive with yellow sputum. He also reports some intermittent chills and rhinorrhea recently. He states his home health nurses have been measuring his oxygen saturation at home and it is usually in the upper 80% range; he does not have oxygen at home. He denies chest pain, chest pressure, leg swelling, sore throat, abdominal distension, fever, or any other associated symptoms.  Cardiologist: Dr. Johnsie Cancel  Past Medical History:  Diagnosis Date  . Allergy   . Back pain   . Chronic diastolic CHF (congestive heart failure) ([place]) 12/21/2015   A. Echo 10/17: Moderate LVH, EF 55-60, normal wall motion, grade 2 diastolic dysfunction, mild aortic stenosis (mean 14, peak 34),  MAC, mild LAE    . CKD (chronic kidney disease) stage 3, GFR 30-59 ml/min 03/04/2014  . Coronary artery disease    a. s/p remote POBA in 1988 // b. NSTEMI in 10/17 >> LHC with 99% LM stenosis >> emergent CABG (L-LAD, S-OM2, S-PDA) - post op course complicated (prolonged ventilation, s/p Trach, AFib, anemia req transfusion, bacteremia >> DC to LTAC)  . Diabetes mellitus   . GERD (gastroesophageal reflux disease)   . History of nuclear stress test    a. Myoview 6/14 - EF 51, no ischemia or scar; Low Risk  . Hyperlipemia 09/11/2013   Last Assessment & Plan:  Lipid abnormalities are improving with treatment. Nutritional counseling was provided. and Pharmacotherapy as ordered. Lipids will be reassessed in 3 months.  . Hypertension   . Neuropathy ([place])   . Persistent atrial fibrillation ([place]) 10/25/2015    Patient Active Problem List   Diagnosis Date Noted  . CHF (congestive heart failure) ([place]) 01/09/2016  . Monitoring for long-term anticoagulant use 12/24/2015  . Tracheostomy present ([place]) 12/21/2015  . Chronic diastolic CHF (congestive heart failure) ([place]) 12/21/2015  . Pressure injury of skin 11/17/2015  . Surgery, elective   . Coronary artery disease involving native heart without angina pectoris 10/27/2015  . S/P CABG x 3   . Acute respiratory failure ([place])   . NSTEMI (non-ST elevated myocardial infarction) ([place])   . Persistent atrial fibrillation ([place]) 10/25/2015  . Seasonal allergic conjunctivitis 08/25/2015  . History of tobacco abuse 08/25/2015  . Acute maxillary sinusitis 06/25/2015  . Diabetes  mellitus ([place]) 03/11/2015  . Diabetes mellitus with stage 3 chronic kidney disease ([place]) 03/11/2015  . Metabolic bone disease 0000000  . Vitamin D deficiency 03/11/2015  . Atopic dermatitis 02/24/2015  . Hip pain 09/24/2014  . Need for immunization against influenza 09/24/2014  . Benign essential hypertension 03/13/2014  . Generalized osteoarthritis of multiple sites 03/13/2014  .  CKD (chronic kidney disease) stage 3, GFR 30-59 ml/min 03/04/2014  . Acute stress disorder 11/12/2013  . Seasonal and perennial allergic rhinitis 11/12/2013  . Allergic urticaria 11/12/2013  . Benign neoplasm of colon 11/12/2013  . Causalgia of upper extremity 11/12/2013  . Cervical radiculopathy 11/12/2013  . Neck pain 11/12/2013  . Chronic fatigue 11/12/2013  . Dizziness 11/12/2013  . Hearing loss 11/12/2013  . Insomnia 11/12/2013  . Obesity 11/12/2013  . Premature ventricular contractions 11/12/2013  . Psychosexual dysfunction with inhibited sexual excitement 11/12/2013  . Hyperlipemia 09/11/2013  . Type 2 diabetes mellitus, uncontrolled ([place]) 09/11/2013  . Pulmonary emphysema ([place]) 04/22/2013  . SOB (shortness of breath) 03/12/2013  . Esophageal reflux   . Benign hypertension with chronic kidney disease, stage III   . Allergy   . Neuropathy ([place])   . Back pain     Past Surgical History:  Procedure Laterality Date  . CARDIAC CATHETERIZATION N/A 10/26/2015   Procedure: Left Heart Cath and Coronary Angiography;  Surgeon: Troy Sine, MD;  Location: [facility];  Service: Cardiovascular;  Laterality: N/A;  . cataract surg     bil/2008  . COLONOSCOPY    . CORONARY ARTERY BYPASS GRAFT N/A 10/26/2015   Procedure: CORONARY ARTERY BYPASS GRAFTING (CABG) x3(LIMA to LA, SVG to OM1, SVG to PDA) with EVH from the left thigh and partial lower leg greater saphenous vein and left internal mammary artery;  Surgeon: Ivin Poot, MD;  Location: [facility];  Service: Open Heart Surgery;  Laterality: N/A;  . CORONARY STENT PLACEMENT     1988  . TEE WITHOUT CARDIOVERSION N/A 10/26/2015   Procedure: TRANSESOPHAGEAL ECHOCARDIOGRAM (TEE);  Surgeon: Ivin Poot, MD;  Location: [facility];  Service: Open Heart Surgery;  Laterality: N/A;  . TONSILLECTOMY         Home Medications    Prior to Admission medications   Medication Sig Start Date End Date Taking? Authorizing Provider    amLODipine (NORVASC) 10 MG tablet TAKE 1 TABLET (10 MG TOTAL) BY MOUTH DAILY. 08/10/15   Josue Hector, MD  aspirin EC 325 MG tablet Take 325 mg by mouth daily.    Historical Provider, MD  atorvastatin (LIPITOR) 80 MG tablet Take 1 tablet (80 mg total) by mouth daily. 12/21/15 03/20/16  Liliane Shi, PA-C  budesonide (PULMICORT) 0.5 MG/2ML nebulizer solution Take 0.5 mg by nebulization daily.  12/20/15 12/19/16  Historical Provider, MD  carvedilol (COREG) 3.125 MG tablet Take 1 tablet (3.125 mg total) by mouth 2 (two) times daily. 12/28/15 03/27/16  Liliane Shi, PA-C  cholecalciferol (VITAMIN D) 1000 units tablet Take 1,000 Units by mouth daily.    Historical Provider, MD  diphenhydramine-acetaminophen (TYLENOL PM) 25-500 MG TABS tablet Take 2 tablets by mouth at bedtime as needed (for sleep).     Historical Provider, MD  fenofibrate 54 MG tablet Take 54 mg by mouth daily.    Historical Provider, MD  furosemide (LASIX) 20 MG tablet TAKE 1 TABLET (20 MG TOTAL) BY MOUTH DAILY. 10/20/15   Josue Hector, MD  Glucosamine-Chondroit-Vit C-Mn (GLUCOSAMINE CHONDR 1500 COMPLX PO) Take  2 tablets by mouth daily.     Historical Provider, MD  hydrALAZINE (APRESOLINE) 50 MG tablet Take 75 mg by mouth 3 (three) times daily.    Historical Provider, MD  isosorbide mononitrate (IMDUR) 30 MG 24 hr tablet Take 1 tablet (30 mg total) by mouth daily. 10/20/15 01/18/16  Isaiah Serge, NP  levocetirizine (XYZAL) 5 MG tablet Take 0.5 tablets (2.5 mg total) by mouth every evening. 08/25/15   Adelina Mings, MD  losartan (COZAAR) 100 MG tablet Take 1 tablet (100 mg total) by mouth daily. 12/24/14   Josue Hector, MD  niacin (NIASPAN) 1000 MG CR tablet Take 1,000 mg by mouth at bedtime.    Historical Provider, MD  TOUJEO SOLOSTAR 300 UNIT/ML SOPN Inject 30 Units into the skin at bedtime.  08/15/14   Historical Provider, MD  traZODone (DESYREL) 50 MG tablet Take 100 mg by mouth at bedtime.     Historical Provider, MD   warfarin (COUMADIN) 1 MG tablet Take 3 tablets (3 mg total) by mouth daily at 6 PM. 01/01/16   Isaiah Serge, NP    Family History Family History  Problem Relation Age of Onset  . Diabetes Father   . Colon cancer Neg Hx   . Esophageal cancer Neg Hx   . Stomach cancer Neg Hx   . Rectal cancer Neg Hx   . Allergic rhinitis Neg Hx   . Angioedema Neg Hx   . Asthma Neg Hx   . Eczema Neg Hx   . Immunodeficiency Neg Hx   . Urticaria Neg Hx     Social History Social History  Substance Use Topics  . Smoking status: Former Smoker    Packs/day: 2.00    Years: 30.00    Types: Cigarettes    Quit date: 06/28/2004  . Smokeless tobacco: Never Used  . Alcohol use No     Comment: QUIT 06/28/2004     Allergies   Amoxicillin; Cephalexin; and Penicillin g   Review of Systems Review of Systems  Constitutional: Positive for chills and fatigue. Negative for fever.  HENT: Positive for rhinorrhea. Negative for sinus pressure and sore throat.   Respiratory: Positive for cough and shortness of breath. Negative for chest tightness.   Cardiovascular: Negative for chest pain, palpitations and leg swelling.  Gastrointestinal: Negative for abdominal distention, abdominal pain, diarrhea, nausea and vomiting.  Genitourinary: Negative for dysuria and flank pain.  Musculoskeletal: Negative for back pain, myalgias, neck pain and neck stiffness.  Skin: Negative for rash and wound.  Neurological: Positive for weakness (generalized). Negative for dizziness, light-headedness, numbness and headaches.  All other systems reviewed and are negative.    Physical Exam Updated Vital Signs BP 135/87 (BP Location: Left Arm)   Pulse 82   Temp 97.9 F (36.6 C) (Oral)   Resp 25   Ht 5\' 7"  (1.702 m)   Wt 200 lb (90.7 kg)   SpO2 96%   BMI 31.32 kg/m   Physical Exam  Constitutional: He is oriented to person, place, and time. He appears well-developed and well-nourished. No distress.  HENT:  Head:  Normocephalic and atraumatic.  Mouth/Throat: Oropharynx is clear and moist. No oropharyngeal exudate.  Eyes: EOM are normal. Pupils are equal, round, and reactive to light.  Neck: Normal range of motion. Neck supple. No JVD present.  Cardiovascular: Normal rate and regular rhythm.  Exam reveals no gallop and no friction rub.   No murmur heard. Pulmonary/Chest: Effort normal and breath sounds normal.  Crackles in bilateral bases  Abdominal: Soft. Bowel sounds are normal. He exhibits no distension. There is no tenderness. There is no rebound and no guarding.  Musculoskeletal: Normal range of motion. He exhibits edema. He exhibits no tenderness.  1+ bilateral lower extremity pitting edema.  Neurological: He is alert and oriented to person, place, and time.  Extremities without deficit. Sensation intact.  Skin: Skin is warm and dry. Capillary refill takes less than 2 seconds. No rash noted. No erythema.  Psychiatric: He has a normal mood and affect. His behavior is normal.  Nursing note and vitals reviewed.    ED Treatments / Results  Labs (all labs ordered are listed, but only abnormal results are displayed) Labs Reviewed  CBC WITH DIFFERENTIAL/PLATELET - Abnormal; Notable for the following:       Result Value   WBC 12.3 (*)    RBC 3.15 (*)    Hemoglobin 9.2 (*)    HCT 30.5 (*)    RDW 17.7 (*)    Platelets 401 (*)    Neutro Abs 9.0 (*)    Basophils Absolute 0.2 (*)    All other components within normal limits  COMPREHENSIVE METABOLIC PANEL - Abnormal; Notable for the following:    Glucose, Bld 138 (*)    BUN 23 (*)    Albumin 3.3 (*)    All other components within normal limits  BRAIN NATRIURETIC PEPTIDE - Abnormal; Notable for the following:    B Natriuretic Peptide 404.3 (*)    All other components within normal limits  TROPONIN I - Abnormal; Notable for the following:    Troponin I 0.06 (*)    All other components within normal limits  PROTIME-INR - Abnormal; Notable for  the following:    Prothrombin Time 24.6 (*)    All other components within normal limits    EKG  EKG Interpretation  Date/Time:  Sunday January 09 2016 14:39:34 EST Ventricular Rate:  82 PR Interval:    QRS Duration: 92 QT Interval:  374 QTC Calculation: 437 R Axis:   94 Text Interpretation:  Atrial fibrillation Right axis deviation Consider anterior infarct Borderline repolarization abnormality Confirmed by Lita Mains  MD, Shelbee Apgar ([id number]) on 01/09/2016 3:35:34 PM       Radiology Dg Chest Port 1 View  Result Date: 01/09/2016 CLINICAL DATA:  Shortness of Breath EXAM: PORTABLE CHEST 1 VIEW COMPARISON:  12/22/2015 FINDINGS: Prior CABG. Mild cardiomegaly. Mild vascular congestion. No confluent opacities or effusions. Mild hyperinflation/COPD. IMPRESSION: Cardiomegaly with mild vascular congestion. COPD. Electronically Signed   By: Rolm Baptise M.D.   On: 01/09/2016 15:46    Procedures Procedures (including critical care time)  DIAGNOSTIC STUDIES: Oxygen Saturation is 98% on [place], normal by my interpretation.    COORDINATION OF CARE: 3:15 PM Discussed treatment plan with pt at bedside and pt agreed to plan.  Medications Ordered in ED Medications  albuterol (PROVENTIL) (2.5 MG/3ML) 0.083% nebulizer solution (2.5 mg  Given 01/09/16 1432)  ipratropium-albuterol (DUONEB) 0.5-2.5 (3) MG/3ML nebulizer solution (3 mLs  Given 01/09/16 1432)  furosemide (LASIX) injection 40 mg (40 mg Intravenous Given 01/09/16 1626)     Initial Impression / Assessment and Plan / ED Course  I have reviewed the triage vital signs and the nursing notes.  Pertinent labs & imaging results that were available during my care of the patient were reviewed by me and considered in my medical decision making (see chart for details).  Clinical Course    Patient with evidence of congestive  heart failure. Given dose of IV Lasix. Discussed with Dr. Marlou Porch who will accept patient in transfer to telemetry bed at [facility]  [place].   Final Clinical Impressions(s) / ED Diagnoses   Final diagnoses:  Acute on chronic systolic congestive heart failure (HCC)    New Prescriptions New Prescriptions   No medications on file   I personally performed the services described in this documentation, which was scribed in my presence. The recorded information has been reviewed and is accurate.      Julianne Rice, MD 01/09/16 [phone]

## 2016-01-09 NOTE — ED Notes (Signed)
Pt given 2 unrinals with instructions to remain in bed. Give call light with instructions to call this rn if any assistance is needed. Pt and son verbalize understanding.

## 2016-01-09 NOTE — ED Triage Notes (Signed)
Patient reports that he has had a cough , with SOB - more SOB with excursion, the patient patient had a Heart attack in October. Son reports that he had hear his father "wheezing" at times when he is sitting on cough. Patient has decreased sats on home o2 sats monitor.

## 2016-01-09 NOTE — ED Notes (Signed)
Family at bedside. 

## 2016-01-09 NOTE — H&P (Signed)
History & Physical    Patient ID: Mark Rivers MRN: [id number], DOB/AGE: 1934-01-26   Admit date: 01/09/2016   Primary Physician: Mark Rivers., MD Primary Cardiologist: Mark Rivers  Patient Profile   Mark Rivers is a 81 y.o. male brought in by family, with extensive PMHx, including chronic CHF, DM, HTN, HLD, NSTEMI, and respiratory failure, who presented to the Emergency Department at [facility] complaining of acute on chronic, worsening SOB for the last several weeks. He had triple bypass surgery on 10/27/15 (emergent) with a complicated post surgical course complicated by prolonged vent dependence,post OP afib. He went home on 12/18/15; he reports nurses have come to their house regularly to check on him. Pt states he had some SOB at baseline before his triple bypass surgery, but it has been worse since he came home. He states he has become SOB at rest for the last few weeks which is unusual, and becomes severely SOB with light exertion. He reports a persistent cough over the last few weeks which is occasionally productive with yellow sputum. He does have orthopnea and has to sleep in a recliner. He also reports some intermittent chills and rhinorrhea recently. He reports that weight has been stable but LEE came on today. He denies chest pain, chest pressure, leg swelling, sore throat, abdominal distension, fever, or any other associated symptoms.  LHC 10/2015: Critical coronary obstructive disease with a 99% distal left main stenosis with thrombus and 80% proximal RCA stenosis with 30% mild irregularity in the mid RCA.  TTE 10/2015 - Left ventricle: The cavity size was normal. Systolic function was   normal. The estimated ejection fraction was in the range of 50%   to 55%. Wall motion was normal; there were no regional wall   motion abnormalities. The study is not technically sufficient to   allow evaluation of LV diastolic function.  Past Medical History    Past Medical History:    Diagnosis Date  . Allergy   . Back pain   . Chronic diastolic CHF (congestive heart failure) ([place]) 12/21/2015   A. Echo 10/17: Moderate LVH, EF 55-60, normal wall motion, grade 2 diastolic dysfunction, mild aortic stenosis (mean 14, peak 34), MAC, mild LAE    . CKD (chronic kidney disease) stage 3, GFR 30-59 ml/min 03/04/2014  . Coronary artery disease    a. s/p remote POBA in 1988 // b. NSTEMI in 10/17 >> LHC with 99% LM stenosis >> emergent CABG (L-LAD, S-OM2, S-PDA) - post op course complicated (prolonged ventilation, s/p Trach, AFib, anemia req transfusion, bacteremia >> DC to LTAC)  . Diabetes mellitus   . GERD (gastroesophageal reflux disease)   . History of nuclear stress test    a. Myoview 6/14 - EF 51, no ischemia or scar; Low Risk  . Hyperlipemia 09/11/2013   Last Assessment & Plan:  Lipid abnormalities are improving with treatment. Nutritional counseling was provided. and Pharmacotherapy as ordered. Lipids will be reassessed in 3 months.  . Hypertension   . Neuropathy ([place])   . Persistent atrial fibrillation ([place]) 10/25/2015    Past Surgical History:  Procedure Laterality Date  . CARDIAC CATHETERIZATION N/A 10/26/2015   Procedure: Left Heart Cath and Coronary Angiography;  Surgeon: Mark Sine, MD;  Location: [place] [facility];  Service: Cardiovascular;  Laterality: N/A;  . cataract surg     bil/2008  . COLONOSCOPY    . CORONARY ARTERY BYPASS GRAFT N/A 10/26/2015   Procedure: CORONARY ARTERY BYPASS GRAFTING (CABG) x3(LIMA to LA,  SVG to OM1, SVG to PDA) with EVH from the left thigh and partial lower leg greater saphenous vein and left internal mammary artery;  Surgeon: Mark Poot, MD;  Location: [place];  Service: Open Heart Surgery;  Laterality: N/A;  . CORONARY STENT PLACEMENT     1988  . TEE WITHOUT CARDIOVERSION N/A 10/26/2015   Procedure: TRANSESOPHAGEAL ECHOCARDIOGRAM (TEE);  Surgeon: Mark Poot, MD;  Location: [place];  Service: Open Heart Surgery;  Laterality:  N/A;  . TONSILLECTOMY       Allergies  Allergies  Allergen Reactions  . Amoxicillin Other (See Comments)    PATIENT PASSES OUT!!!!  . Cephalexin Other (See Comments)    PATIENT PASSES OUT!!!!  . Penicillin G Other (See Comments)    Has patient had a PCN reaction causing immediate rash, facial/tongue/throat swelling, SOB or lightheadedness with hypotension: Yes Has patient had a PCN reaction causing severe rash involving mucus membranes or skin necrosis: No Has patient had a PCN reaction that required hospitalization: Yes Has patient had a PCN reaction occurring within the last 10 years: Yes If all of the above answers are "NO", then may proceed with Cephalosporin use. PATIENT PASSES OUT!!!!      Home Medications    Prior to Admission medications   Medication Sig Start Date End Date Taking? Authorizing Provider  amLODipine (NORVASC) 10 MG tablet TAKE 1 TABLET (10 MG TOTAL) BY MOUTH DAILY. 08/10/15   Mark Hector, MD  aspirin EC 325 MG tablet Take 325 mg by mouth daily.    Historical Provider, MD  atorvastatin (LIPITOR) 80 MG tablet Take 1 tablet (80 mg total) by mouth daily. 12/21/15 03/20/16  Mark Shi, PA-C  budesonide (PULMICORT) 0.5 MG/2ML nebulizer solution Take 0.5 mg by nebulization daily.  12/20/15 12/19/16  Historical Provider, MD  carvedilol (COREG) 3.125 MG tablet Take 1 tablet (3.125 mg total) by mouth 2 (two) times daily. 12/28/15 03/27/16  Mark Shi, PA-C  cholecalciferol (VITAMIN D) 1000 units tablet Take 1,000 Units by mouth daily.    Historical Provider, MD  diphenhydramine-acetaminophen (TYLENOL PM) 25-500 MG TABS tablet Take 2 tablets by mouth at bedtime as needed (for sleep).     Historical Provider, MD  fenofibrate 54 MG tablet Take 54 mg by mouth daily.    Historical Provider, MD  furosemide (LASIX) 20 MG tablet TAKE 1 TABLET (20 MG TOTAL) BY MOUTH DAILY. 10/20/15   Mark Hector, MD  Glucosamine-Chondroit-Vit C-Mn (GLUCOSAMINE CHONDR 1500 COMPLX PO)  Take 2 tablets by mouth daily.     Historical Provider, MD  hydrALAZINE (APRESOLINE) 50 MG tablet Take 75 mg by mouth 3 (three) times daily.    Historical Provider, MD  isosorbide mononitrate (IMDUR) 30 MG 24 hr tablet Take 1 tablet (30 mg total) by mouth daily. 10/20/15 01/18/16  Isaiah Serge, NP  levocetirizine (XYZAL) 5 MG tablet Take 0.5 tablets (2.5 mg total) by mouth every evening. 08/25/15   Adelina Mings, MD  losartan (COZAAR) 100 MG tablet Take 1 tablet (100 mg total) by mouth daily. 12/24/14   Mark Hector, MD  niacin (NIASPAN) 1000 MG CR tablet Take 1,000 mg by mouth at bedtime.    Historical Provider, MD  TOUJEO SOLOSTAR 300 UNIT/ML SOPN Inject 30 Units into the skin at bedtime.  08/15/14   Historical Provider, MD  traZODone (DESYREL) 50 MG tablet Take 100 mg by mouth at bedtime.     Historical Provider, MD  warfarin (COUMADIN) 1  MG tablet Take 3 tablets (3 mg total) by mouth daily at 6 PM. 01/01/16   Isaiah Serge, NP    Family History    Family History  Problem Relation Age of Onset  . Diabetes Father   . Colon cancer Neg Hx   . Esophageal cancer Neg Hx   . Stomach cancer Neg Hx   . Rectal cancer Neg Hx   . Allergic rhinitis Neg Hx   . Angioedema Neg Hx   . Asthma Neg Hx   . Eczema Neg Hx   . Immunodeficiency Neg Hx   . Urticaria Neg Hx     Social History    Social History   Social History  . Marital status: Married    Spouse name: N/A  . Number of children: N/A  . Years of education: N/A   Occupational History  . retired    Social History Main Topics  . Smoking status: Former Smoker    Packs/day: 2.00    Years: 30.00    Types: Cigarettes    Quit date: 06/28/2004  . Smokeless tobacco: Never Used  . Alcohol use No     Comment: QUIT 06/28/2004  . Drug use: No  . Sexual activity: Yes   Other Topics Concern  . Not on file   Social History Narrative  . No narrative on file     Review of Systems    General:  No chills, fever, night sweats or  weight changes.  Cardiovascular:  See above Dermatological: No rash, lesions/masses Respiratory: No cough, dyspnea Urologic: No hematuria, dysuria Abdominal:   No nausea, vomiting, diarrhea, bright red blood per rectum, melena, or hematemesis Neurologic:  No visual changes, wkns, changes in mental status. All other systems reviewed and are otherwise negative except as noted above.  Physical Exam    Blood pressure (!) 161/60, pulse 86, temperature 97.9 F (36.6 C), temperature source Oral, resp. rate 20, height 5\' 7"  (1.702 m), weight 90.3 kg (199 lb 1.2 oz), SpO2 97 %.  General: Pleasant, NAD Psych: Normal affect. Neuro: Alert and oriented X 3. Moves all extremities spontaneously. HEENT: Normal  Neck: JVP elevated Lungs:  LLL crackles Heart: irregular, RV heave. Abdomen: Soft, non-tender, non-distended, BS + x 4.  Extremities: 2+ LEE  Labs    Troponin (Point of Care Test) No results for input(s): TROPIPOC in the last 72 hours.  Recent Labs  01/09/16 1445  TROPONINI 0.06*  BNP 400  Lab Results  Component Value Date   WBC 12.3 (H) 01/09/2016   HGB 9.2 (L) 01/09/2016   HCT 30.5 (L) 01/09/2016   MCV 96.8 01/09/2016   PLT 401 (H) 01/09/2016    Recent Labs Lab 01/09/16 1445  NA 141  K 4.3  CL 109  CO2 25  BUN 23*  CREATININE 1.04  CALCIUM 9.0  PROT 7.3  BILITOT 0.3  ALKPHOS 80  ALT 19  AST 25  GLUCOSE 138*   Lab Results  Component Value Date   TRIG 268 (H) 11/05/2015   Lab Results  Component Value Date   DDIMER (H) 10/26/2008    1.72        AT THE INHOUSE ESTABLISHED CUTOFF VALUE OF 0.48 ug/mL FEU, THIS ASSAY HAS BEEN DOCUMENTED IN THE LITERATURE TO HAVE A SENSITIVITY AND NEGATIVE PREDICTIVE VALUE OF AT LEAST 98 TO 99%.  THE TEST RESULT SHOULD BE CORRELATED WITH AN ASSESSMENT OF THE CLINICAL PROBABILITY OF DVT / VTE.     Radiology Studies  Dg Chest 2 View  Result Date: 12/22/2015 CLINICAL DATA:  Coronary artery disease.  Status post  CABG. EXAM: CHEST  2 VIEW COMPARISON:  12/05/2015 and 11/08/2015 FINDINGS: There are tiny residual bilateral pleural effusions, almost completely resolved on the left since the prior exam. Heart size and pulmonary vascularity are normal. Lungs are clear. No acute bone abnormality. CABG. IMPRESSION: Tiny residual bilateral pleural effusions. Electronically Signed   By: Lorriane Shire M.D.   On: 12/22/2015 13:06   Dg Chest Port 1 View  Result Date: 01/09/2016 CLINICAL DATA:  Shortness of Breath EXAM: PORTABLE CHEST 1 VIEW COMPARISON:  12/22/2015 FINDINGS: Prior CABG. Mild cardiomegaly. Mild vascular congestion. No confluent opacities or effusions. Mild hyperinflation/COPD. IMPRESSION: Cardiomegaly with mild vascular congestion. COPD. Electronically Signed   By: Rolm Baptise M.D.   On: 01/09/2016 15:46    ECG & Cardiac Imaging    Afib rate 80 with non specific ST T changes that are chronic  Assessment & Plan    Mr Mccrohan has a PMH of CAD, HTN and Afib. He now presents with acute systolic and diastolic  HF exacerbation. He is fluid overloaded on exam. He is hemodynamically stable. Cause for decompensation could be increased salt and fluid intake. His son is doing most of the cooking. I dont think he has a type I event (trop is just above normal). Likely a type 2 event in setting of HF.    Plan:   - cont on home BB, ARB, CCB, ASA, Statin and nitrates - Lasix 40mg  iv BID - TTE ordered to eval for new WMA and LVEF and also RVSP - sub q lasix  Signed, Cristina Gong, MD 01/09/2016, 10:12 PM

## 2016-01-09 NOTE — Progress Notes (Signed)
Patient arrived in the unit accompanied by three [facility] personnel via stretcher. Orientation to the unit given. Patient verbalizes understanding.

## 2016-01-09 NOTE — ED Notes (Signed)
Patient is resting comfortably. 

## 2016-01-10 ENCOUNTER — Encounter (HOSPITAL_COMMUNITY): Payer: Self-pay | Admitting: *Deleted

## 2016-01-10 DIAGNOSIS — I2510 Atherosclerotic heart disease of native coronary artery without angina pectoris: Secondary | ICD-10-CM

## 2016-01-10 DIAGNOSIS — I5033 Acute on chronic diastolic (congestive) heart failure: Secondary | ICD-10-CM

## 2016-01-10 DIAGNOSIS — I481 Persistent atrial fibrillation: Secondary | ICD-10-CM

## 2016-01-10 DIAGNOSIS — E1159 Type 2 diabetes mellitus with other circulatory complications: Secondary | ICD-10-CM

## 2016-01-10 DIAGNOSIS — K922 Gastrointestinal hemorrhage, unspecified: Secondary | ICD-10-CM

## 2016-01-10 DIAGNOSIS — I10 Essential (primary) hypertension: Secondary | ICD-10-CM

## 2016-01-10 DIAGNOSIS — Z951 Presence of aortocoronary bypass graft: Secondary | ICD-10-CM

## 2016-01-10 HISTORY — DX: Gastrointestinal hemorrhage, unspecified: K92.2

## 2016-01-10 LAB — BASIC METABOLIC PANEL
Anion gap: 11 (ref 5–15)
BUN: 20 mg/dL (ref 6–20)
CHLORIDE: 103 mmol/L (ref 101–111)
CO2: 28 mmol/L (ref 22–32)
CREATININE: 1.11 mg/dL (ref 0.61–1.24)
Calcium: 9 mg/dL (ref 8.9–10.3)
GFR calc Af Amer: >60 mL/min (ref >60)
GFR calc non Af Amer: >60 mL/min (ref >60)
GLUCOSE: 104 mg/dL — AB (ref 65–99)
Potassium: 4.1 mmol/L (ref 3.5–5.1)
Sodium: 142 mmol/L (ref 135–145)

## 2016-01-10 LAB — CBC
HCT: 31.2 % — ABNORMAL LOW (ref 39.0–52.0)
Hemoglobin: 9.4 g/dL — ABNORMAL LOW (ref 13.0–17.0)
MCH: 28.2 pg (ref 26.0–34.0)
MCHC: 30.1 g/dL (ref 30.0–36.0)
MCV: 93.7 fL (ref 78.0–100.0)
PLATELETS: 365 10*3/uL (ref 150–400)
RBC: 3.33 MIL/uL — ABNORMAL LOW (ref 4.22–5.81)
RDW: 17.5 % — AB (ref 11.5–15.5)
WBC: 11.4 10*3/uL — ABNORMAL HIGH (ref 4.0–10.5)

## 2016-01-10 LAB — GLUCOSE, CAPILLARY
GLUCOSE-CAPILLARY: 108 mg/dL — AB (ref 65–99)
Glucose-Capillary: 112 mg/dL — ABNORMAL HIGH (ref 65–99)
Glucose-Capillary: 141 mg/dL — ABNORMAL HIGH (ref 65–99)
Glucose-Capillary: 160 mg/dL — ABNORMAL HIGH (ref 65–99)
Glucose-Capillary: 254 mg/dL — ABNORMAL HIGH (ref 65–99)

## 2016-01-10 LAB — PROTIME-INR
INR: 1.99
Prothrombin Time: 22.9 seconds — ABNORMAL HIGH (ref 11.4–15.2)

## 2016-01-10 MED ORDER — FUROSEMIDE 40 MG PO TABS
40.0000 mg | ORAL_TABLET | Freq: Two times a day (BID) | ORAL | Status: DC
  Administered 2016-01-11 – 2016-01-12 (×3): 40 mg via ORAL
  Filled 2016-01-10 (×3): qty 1

## 2016-01-10 MED ORDER — CARVEDILOL 6.25 MG PO TABS
6.2500 mg | ORAL_TABLET | Freq: Two times a day (BID) | ORAL | Status: DC
  Administered 2016-01-10 – 2016-01-12 (×5): 6.25 mg via ORAL
  Filled 2016-01-10 (×5): qty 1

## 2016-01-10 NOTE — Progress Notes (Addendum)
Patient Name: Mark Rivers Date of Encounter: 01/10/2016  Primary Cardiologist: [facility] Problem List     Principal Problem:   Acute on chronic heart failure with normal ejection fraction [facility]) Active Problems:   Diabetes mellitus (HCC)   Persistent atrial fibrillation (HCC)   Coronary artery disease involving native heart without angina pectoris   S/P CABG x 3   Chronic diastolic CHF (congestive heart failure) (HCC)     Subjective   Feels substantially improved. He is able to lie flat and speak in uninterrupted sentences. Still appears slightly tachypneic. Mild residual retromalleolar edema. Unable to ascertain "dry weight" - he has lost substantial "real" weight since his surgery  Inpatient Medications    Scheduled Meds: . amLODipine  5 mg Oral Daily  . aspirin EC  325 mg Oral Daily  . atorvastatin  80 mg Oral Daily  . budesonide  0.5 mg Nebulization Daily  . carvedilol  3.125 mg Oral BID WC  . cholecalciferol  1,000 Units Oral Daily  . fenofibrate  54 mg Oral Daily  . furosemide  40 mg Intravenous Q12H  . hydrALAZINE  75 mg Oral TID  . insulin glargine  30 Units Subcutaneous QHS  . isosorbide mononitrate  30 mg Oral Daily  . loratadine  5 mg Oral QPM  . losartan  100 mg Oral Daily  . niacin  1,000 mg Oral QHS  . sodium chloride flush  3 mL Intravenous Q12H  . traZODone  100 mg Oral QHS  . warfarin  3 mg Oral q1800  . Warfarin - Physician Dosing Inpatient   Does not apply q1800   Continuous Infusions:  PRN Meds: sodium chloride, acetaminophen, ondansetron (ZOFRAN) IV, sodium chloride flush   Vital Signs    Vitals:   01/09/16 1730 01/09/16 1836 01/09/16 2016 01/10/16 0528  BP: 164/88 (!) 168/76 (!) 161/60 (!) 155/81  Pulse: 89 65 86 96  Resp: 24 20 20 20   Temp:  97.7 F (36.5 C) 97.9 F (36.6 C) 97.8 F (36.6 C)  TempSrc:  Oral Oral Oral  SpO2: 93% 92% 97% 96%  Weight:  199 lb 1.2 oz (90.3 kg)  194 lb 12.8 oz (88.4 kg)  Height:  5\' 7"   (1.702 m)      Intake/Output Summary (Last 24 hours) at 01/10/16 0853 Last data filed at 01/10/16 0828  Gross per 24 hour  Intake              480 ml  Output             2925 ml  Net            -2445 ml   Filed Weights   01/09/16 1422 01/09/16 1836 01/10/16 0528  Weight: 200 lb (90.7 kg) 199 lb 1.2 oz (90.3 kg) 194 lb 12.8 oz (88.4 kg)    Physical Exam   Comfortable, RR 22 GEN: Well nourished, well developed, in no acute distress.  HEENT: Grossly normal.  Neck: Supple, no JVD, carotid bruits, or masses. Cardiac: irregular, 1-2/6 aortic ejection murmur, no diatolic murmurs, rubs, or gallops. No clubbing, cyanosis. 1-2+ ankle edema.  Radials/DP/PT 2+ and equal bilaterally.  Respiratory:  Respirations regular and unlabored, clear to auscultation bilaterally. GI: Soft, nontender, nondistended, BS + x 4. MS: no deformity or atrophy. Skin: warm and dry, no rash. Neuro:  Strength and sensation are intact. Psych: AAOx3.  Normal affect.  Labs    CBC  Recent Labs  01/09/16 1445 01/10/16 0517  WBC 12.3* 11.4*  NEUTROABS 9.0*  --   HGB 9.2* 9.4*  HCT 30.5* 31.2*  MCV 96.8 93.7  PLT 401* 99991111   Basic Metabolic Panel  Recent Labs  01/09/16 1445 01/10/16 0517  NA 141 142  K 4.3 4.1  CL 109 103  CO2 25 28  GLUCOSE 138* 104*  BUN 23* 20  CREATININE 1.04 1.11  CALCIUM 9.0 9.0   Liver Function Tests  Recent Labs  01/09/16 1445  AST 25  ALT 19  ALKPHOS 80  BILITOT 0.3  PROT 7.3  ALBUMIN 3.3*   No results for input(s): LIPASE, AMYLASE in the last 72 hours. Cardiac Enzymes  Recent Labs  01/09/16 1445  TROPONINI 0.06*    Telemetry    AF with VR around 100 - Personally Reviewed  ECG    AFib, rate controlled, a few wide complex beats, QT in normal range, lateral ST-T changes similar to older tracings - Personally Reviewed  Radiology    Dg Chest Port 1 View  Result Date: 01/09/2016 CLINICAL DATA:  Shortness of Breath EXAM: PORTABLE CHEST 1 VIEW  COMPARISON:  12/22/2015 FINDINGS: Prior CABG. Mild cardiomegaly. Mild vascular congestion. No confluent opacities or effusions. Mild hyperinflation/COPD. IMPRESSION: Cardiomegaly with mild vascular congestion. COPD. Electronically Signed   By: Rolm Baptise M.D.   On: 01/09/2016 15:46    Cardiac Studies   Echo pending  Patient Profile     82 yo with CHF (preserved EF), CAD s/p CABG in Oct 0000000 (complicated by prolonged respiratory failure), in persistent atrial fibrillation since postop period, admitted with acute HF exacerbation, improving with diuresis.  Assessment & Plan    1. Acute on chronic HF: better after diuresis, echo pending. Switch to PO diuretics after one more IV dose. Hard to establish dry weight due to marked real weight loss in complicated postop period. On max dose ARB, high dose hydralazine + nitrates, low dose carvedilol. Rate control is fair, but imperfect. Will increase carvedilol dose. Possible DC in 24-48 hours. Discussed sodium restriction (eating ham, soup) and daily weights after DC. Has a follow up with Richardson Dopp on 01/17/16. 2. Persistent atrial fibrillation: on warfarin with borderline INR today, otherwise therapeutically anticoagulated since 12/26. Cardioversion should be attempted, but only after 3 weeks of uninterrupted INR of 2.0 or higher (was 1.9 on 12/15). Continue amiodarone. 3. CAD s/p CABG: emergency surgery on 10/27/15. Angina free. On statin. 4. DM: excellent glycemic control.  Signed, Sanda Klein, MD  01/10/2016, 8:53 AM

## 2016-01-11 ENCOUNTER — Inpatient Hospital Stay (HOSPITAL_COMMUNITY): Payer: Medicare Other

## 2016-01-11 ENCOUNTER — Other Ambulatory Visit (HOSPITAL_COMMUNITY): Payer: Medicare Other

## 2016-01-11 DIAGNOSIS — I5032 Chronic diastolic (congestive) heart failure: Secondary | ICD-10-CM

## 2016-01-11 DIAGNOSIS — R9431 Abnormal electrocardiogram [ECG] [EKG]: Secondary | ICD-10-CM

## 2016-01-11 LAB — PROTIME-INR
INR: 1.7
Prothrombin Time: 20.2 seconds — ABNORMAL HIGH (ref 11.4–15.2)

## 2016-01-11 LAB — BASIC METABOLIC PANEL
Anion gap: 10 (ref 5–15)
BUN: 24 mg/dL — AB (ref 6–20)
CALCIUM: 8.5 mg/dL — AB (ref 8.9–10.3)
CO2: 27 mmol/L (ref 22–32)
CREATININE: 1.23 mg/dL (ref 0.61–1.24)
Chloride: 100 mmol/L — ABNORMAL LOW (ref 101–111)
GFR calc non Af Amer: 54 mL/min — ABNORMAL LOW (ref >60)
GLUCOSE: 103 mg/dL — AB (ref 65–99)
Potassium: 3.6 mmol/L (ref 3.5–5.1)
Sodium: 137 mmol/L (ref 135–145)

## 2016-01-11 LAB — GLUCOSE, CAPILLARY
GLUCOSE-CAPILLARY: 133 mg/dL — AB (ref 65–99)
GLUCOSE-CAPILLARY: 150 mg/dL — AB (ref 65–99)
Glucose-Capillary: 96 mg/dL (ref 65–99)
Glucose-Capillary: 128 mg/dL — ABNORMAL HIGH (ref 65–99)

## 2016-01-11 LAB — ECHOCARDIOGRAM COMPLETE
Height: 67 in
Weight: 3054.4 [oz_av]

## 2016-01-11 MED ORDER — INSULIN ASPART 100 UNIT/ML ~~LOC~~ SOLN
0.0000 [IU] | Freq: Three times a day (TID) | SUBCUTANEOUS | Status: DC
  Administered 2016-01-11 – 2016-01-12 (×3): 1 [IU] via SUBCUTANEOUS

## 2016-01-11 NOTE — Progress Notes (Signed)
SATURATION QUALIFICATIONS: (This note is used to comply with regulatory documentation for home oxygen)  Patient Saturations on Room Air at Rest = 92RA  Patient Saturations on Room Air while Ambulating  [phone]  Patient Saturations on 2Liters of oxygen while Ambulating = 94  Please briefly explain why patient needs home oxygen:

## 2016-01-11 NOTE — Progress Notes (Signed)
[facility]  Patient Status: Active (receiving services up to time of hospitalization)  [facility] is providing the following services: RN, PT, OT and MSW  If patient discharges after hours, please call [phone].   Mark Rivers 01/11/2016, 2:36 PM

## 2016-01-11 NOTE — Progress Notes (Addendum)
Patient Name: Mark Rivers Date of Encounter: 01/11/2016  Primary Cardiologist: [facility] Problem List     Principal Problem:   Acute on chronic heart failure with normal ejection fraction [facility]) Active Problems:   Diabetes mellitus ([place])   Persistent atrial fibrillation (HCC)   Coronary artery disease involving native heart without angina pectoris   S/P CABG x 3   Chronic diastolic CHF (congestive heart failure) (HCC)     Subjective   Feels substantially improved.No sleep last PM due too back and hip hurting.  Inpatient Medications    Scheduled Meds: . amLODipine  5 mg Oral Daily  . aspirin EC  325 mg Oral Daily  . atorvastatin  80 mg Oral Daily  . budesonide  0.5 mg Nebulization Daily  . carvedilol  6.25 mg Oral BID WC  . cholecalciferol  1,000 Units Oral Daily  . fenofibrate  54 mg Oral Daily  . furosemide  40 mg Oral BID  . hydrALAZINE  75 mg Oral TID  . insulin aspart  0-9 Units Subcutaneous TID WC  . insulin glargine  30 Units Subcutaneous QHS  . isosorbide mononitrate  30 mg Oral Daily  . loratadine  5 mg Oral QPM  . losartan  100 mg Oral Daily  . niacin  1,000 mg Oral QHS  . sodium chloride flush  3 mL Intravenous Q12H  . traZODone  100 mg Oral QHS  . warfarin  3 mg Oral q1800  . Warfarin - Physician Dosing Inpatient   Does not apply q1800   Continuous Infusions:  PRN Meds: sodium chloride, acetaminophen, ondansetron (ZOFRAN) IV, sodium chloride flush   Vital Signs    Vitals:   01/10/16 1942 01/10/16 2346 01/11/16 0453 01/11/16 0922  BP: 100/65 109/60 121/63 (!) 104/56  Pulse: 91 85 (!) 115 71  Resp: 18 18 20    Temp: 98.5 F (36.9 C) 97.8 F (36.6 C) 98.3 F (36.8 C)   TempSrc: Oral Oral Oral   SpO2: 94% 96% 93%   Weight:   190 lb 14.4 oz (86.6 kg)   Height:        Intake/Output Summary (Last 24 hours) at 01/11/16 1211 Last data filed at 01/11/16 0932  Gross per 24 hour  Intake              943 ml  Output             1200 ml    Net             -257 ml   Filed Weights   01/09/16 1836 01/10/16 0528 01/11/16 0453  Weight: 199 lb 1.2 oz (90.3 kg) 194 lb 12.8 oz (88.4 kg) 190 lb 14.4 oz (86.6 kg)    Physical Exam  Comfortable, and mildly dyspneic during conversation. GEN: Well nourished, well developed, in no acute distress.  HEENT: Grossly normal.  Neck: Supple, carotid bruits, or masses.. JVD present bilaterally. Cardiac: irregular, 1-2/6 aortic ejection murmur, no diatolic murmurs, rubs, or gallops. No clubbing, cyanosis. 1-2+ ankle edema.  Radials/DP/PT 2+ and equal bilaterally.  Respiratory:  Respirations regular and unlabored, clear to auscultation bilaterally. GI: Soft, nontender, nondistended, BS + x 4. MS: no deformity or atrophy. Skin: warm and dry, no rash. Neuro:  Strength and sensation are intact. Psych: AAOx3.  Normal affect.  Labs    CBC  Recent Labs  01/09/16 1445 01/10/16 0517  WBC 12.3* 11.4*  NEUTROABS 9.0*  --   HGB 9.2* 9.4*  HCT 30.5*  31.2*  MCV 96.8 93.7  PLT 401* 99991111   Basic Metabolic Panel  Recent Labs  01/10/16 0517 01/11/16 0508  NA 142 137  K 4.1 3.6  CL 103 100*  CO2 28 27  GLUCOSE 104* 103*  BUN 20 24*  CREATININE 1.11 1.23  CALCIUM 9.0 8.5*   Liver Function Tests  Recent Labs  01/09/16 1445  AST 25  ALT 19  ALKPHOS 80  BILITOT 0.3  PROT 7.3  ALBUMIN 3.3*   No results for input(s): LIPASE, AMYLASE in the last 72 hours. Cardiac Enzymes  Recent Labs  01/09/16 1445  TROPONINI 0.06*    Telemetry    AF with VR around 100 - Personally Reviewed  ECG    AFib, rate controlled, a few wide complex beats, QT in normal range, lateral ST-T changes similar to older tracings - Personally Reviewed  Radiology    Dg Chest Port 1 View  Result Date: 01/09/2016 CLINICAL DATA:  Shortness of Breath EXAM: PORTABLE CHEST 1 VIEW COMPARISON:  12/22/2015 FINDINGS: Prior CABG. Mild cardiomegaly. Mild vascular congestion. No confluent opacities or effusions.  Mild hyperinflation/COPD. IMPRESSION: Cardiomegaly with mild vascular congestion. COPD. Electronically Signed   By: Rolm Baptise M.D.   On: 01/09/2016 15:46    Cardiac Studies   Echo pending  Patient Profile     82 yo with CHF (preserved EF), CAD s/p CABG in Oct 0000000 (complicated by prolonged respiratory failure), in persistent atrial fibrillation since postop period, admitted with acute HF exacerbation, improving with diuresis.  Assessment & Plan    1. Acute on chronic HF: Greater than 3 L diuresis since admission. Slight increasing creatinine from admission to 1.23. Now back on oral diuretic therapy. Plan to ambulate and if doing relatively well, potentially discharge with office follow-up 01/17/16. Heart failure is possibly aggravated by atrial fibrillation.  2. Persistent atrial fibrillation: on warfarin with subtherapeutic INR of 1.7 today. Atrial fibrillation developed post ordinary bypass grafting. Will need cardioversion when adequately anticoagulated for 3-6 weeks with INR greater than 2.0. Rate control currently achieved with carvedilol.Not on amiodarone as suggested by Dr. Sallyanne Kuster.  3. CAD s/p CABG: emergency surgery on 10/27/15. Angina free. On statin.  4. DM: excellent glycemic control.  5. Acute kidney injury with IV diuresis, Creat 1.24 today. Reassess on oral therapy in AM and if stable, DC.  Ambulate and consider discharge tomorrowi stable. Signed, Sinclair Grooms, MD  01/11/2016, 12:11 PM

## 2016-01-11 NOTE — Progress Notes (Signed)
*  PRELIMINARY RESULTS* Echocardiogram 2D Echocardiogram has been performed.  Mark Rivers 01/11/2016, 1:48 PM

## 2016-01-11 NOTE — Care Management Note (Addendum)
Case Management Note  Patient Details  Name: Tlaloc Porrazzo MRN: [id number] Date of Birth: 1934/07/27  Subjective/Objective:   Admitted with CHF                 Action/Plan: Patient lives at home alone, his spouse recently went into a hospice facility; his son assist him as needed; Primary Physician: [name]., MD; has private insurance with Medicare / with prescription drug coverage; [facility]; DME - has a walker and scales at home; He is active with [facility] as prior to admission..   Expected Discharge Date:  Possibly 01/11/2016             Expected Discharge Plan:  Home/Self Care  Discharge planning Services  CM Consult    Status of Service:  In process, will continue to follow  Sherrilyn Rist [id number] 01/11/2016, 2:27 PM

## 2016-01-11 NOTE — Discharge Summary (Addendum)
Discharge Summary    Patient ID: Mark Rivers,  MRN: [id number], DOB/AGE: Nov 18, 1934 82 y.o.  Admit date: 01/09/2016 Discharge date: 01/12/2016  Primary Care Provider: [name]. Primary Cardiologist: Dr. Johnsie Cancel  Discharge Diagnoses    Principal Problem:   Acute on chronic heart failure with normal ejection fraction (HCC) Active Problems:   Diabetes mellitus ([place])   Persistent atrial fibrillation (HCC)   Coronary artery disease involving native heart without angina pectoris   S/P CABG x 3   Chronic diastolic CHF (congestive heart failure) (HCC)   Allergies Allergies  Allergen Reactions  . Amoxicillin Other (See Comments)    PATIENT PASSES OUT!!!!  . Cephalexin Other (See Comments)    PATIENT PASSES OUT!!!!  . Penicillin G Other (See Comments)    Has patient had a PCN reaction causing immediate rash, facial/tongue/throat swelling, SOB or lightheadedness with hypotension: Yes Has patient had a PCN reaction causing severe rash involving mucus membranes or skin necrosis: No Has patient had a PCN reaction that required hospitalization: Yes Has patient had a PCN reaction occurring within the last 10 years: Yes If all of the above answers are "NO", then may proceed with Cephalosporin use. PATIENT PASSES OUT!!!!    Diagnostic Studies/Procedures    TTE: 01/11/16  Study Conclusions  - Left ventricle: The cavity size was normal. Wall thickness was   increased in a pattern of moderate LVH. Systolic function was   normal. The estimated ejection fraction was in the range of 55%   to 60%. Basal inferior akinesis. Indeterminant diastolic function   (atrial fibrillation). - Ventricular septum: D-shaped interventricular septum suggestive   of RV pressure/volume overload. - Aortic valve: Trileaflet; moderately calcified leaflets. Suspect   mild aortic stenosis but mean gradient not measured. - Mitral valve: Moderately calcified annulus. There was no   significant  regurgitation. - Left atrium: The atrium was moderately dilated. - Right ventricle: The cavity size was mildly dilated. Systolic   function was mildly reduced. - Right atrium: The atrium was mildly dilated. - Tricuspid valve: Peak RV-RA gradient (S): 39 mm Hg. - Pulmonary arteries: PA peak pressure: 47 mm Hg (S). - Systemic veins: IVC measured 2.4 cm with > 50% respirophasic   variation, suggesting RA pressure 8 mmHg.  Impressions:  - Normal LV size with moderate LV hypertrophy. EF 55-60%. Basal   inferior akinesis. D-shaped interventricular septum suggestive of   RV pressure/volume overload. Mildly dilated RV with mildly   decreased systolic function. Mild pulmonary hypertension. _____________   History of Present Illness     Mark Rivers a 82 y.o.malebrought in by family, with extensive PMHx, including chronic CHF, DM, HTN, HLD, NSTEMI, and respiratory failure, who presented to the Emergency Department at [facility] complaining of acute on chronic, worsening SOB for the last several weeks. He had triple bypass surgery on 10/27/15 (emergent) with a complicated post surgical course complicated by prolonged vent dependence,post OP afib. He went home on 12/18/15; he reports nurses have come to their house regularly to check on him. Pt states he had some SOB at baseline before his triple bypass surgery, but it has been worse since he came home. He states he has become SOB at rest for the last few weeks which is unusual, and becomes severely SOB with light exertion. He reports a persistent cough over the last few weeks which is occasionally productive with yellow sputum. He does have orthopnea and has to sleep in a recliner. He also reports some intermittent  chills and rhinorrhea recently. He reports that weight has been stable but LEE came on today. He denies chest pain, chest pressure, leg swelling, sore throat, abdominal distension, fever, or any other associated symptoms.  Hospital  Course     Consultants: None   He was admitted with acute on chronic systolic/diastolic HF plans for IV diuresis. He reported feeling better after diuresis. Noted difficult to establish dry weight due to weight loss in complicated post op period. His coreg was further increased for better rate control. Consideration was given for possible cardioversion after 3 weeks of uninterrupted INR. He diuresed a total of 4L this admission, with weight at discharge being 191lbs. He reported feeling better and breathing easier. He was noted to drop his O2 sats on RA and unable to maintain with ambulation.   On 01/12/16 he was seen and assessed by Dr. Tamala Julian and determined stable for discharge. His INR was noted to be subtherapeutic this admission, therefore his dose will be increased to 4mg  daily with follow up INR at office visit. His lasix at discharge will be 40mg  daily, though he will hold a dose today, and start this tomorrow. Home order was also placed for home O2. He has follow up arranged in the office next week.  _____________  Discharge Vitals Blood pressure 102/60, pulse 80, temperature 97.6 F (36.4 C), temperature source Oral, resp. rate 18, height 5\' 7"  (1.702 m), weight 191 lb 1.6 oz (86.7 kg), SpO2 94 %.  Filed Weights   01/10/16 0528 01/11/16 0453 01/12/16 0529  Weight: 194 lb 12.8 oz (88.4 kg) 190 lb 14.4 oz (86.6 kg) 191 lb 1.6 oz (86.7 kg)    Labs & Radiologic Studies    CBC  Recent Labs  01/09/16 1445 01/10/16 0517  WBC 12.3* 11.4*  NEUTROABS 9.0*  --   HGB 9.2* 9.4*  HCT 30.5* 31.2*  MCV 96.8 93.7  PLT 401* 99991111   Basic Metabolic Panel  Recent Labs  01/11/16 0508 01/12/16 0410  NA 137 139  K 3.6 4.1  CL 100* 99*  CO2 27 32  GLUCOSE 103* 103*  BUN 24* 33*  CREATININE 1.23 1.56*  CALCIUM 8.5* 8.7*  MG  --  2.1   Liver Function Tests  Recent Labs  01/09/16 1445  AST 25  ALT 19  ALKPHOS 80  BILITOT 0.3  PROT 7.3  ALBUMIN 3.3*   No results for input(s):  LIPASE, AMYLASE in the last 72 hours. Cardiac Enzymes  Recent Labs  01/09/16 1445  TROPONINI 0.06*   BNP Invalid input(s): POCBNP D-Dimer No results for input(s): DDIMER in the last 72 hours. Hemoglobin A1C No results for input(s): HGBA1C in the last 72 hours. Fasting Lipid Panel No results for input(s): CHOL, HDL, LDLCALC, TRIG, CHOLHDL, LDLDIRECT in the last 72 hours. Thyroid Function Tests No results for input(s): TSH, T4TOTAL, T3FREE, THYROIDAB in the last 72 hours.  Invalid input(s): FREET3 _____________  Dg Chest 2 View  Result Date: 12/22/2015 CLINICAL DATA:  Coronary artery disease.  Status post CABG. EXAM: CHEST  2 VIEW COMPARISON:  12/05/2015 and 11/08/2015 FINDINGS: There are tiny residual bilateral pleural effusions, almost completely resolved on the left since the prior exam. Heart size and pulmonary vascularity are normal. Lungs are clear. No acute bone abnormality. CABG. IMPRESSION: Tiny residual bilateral pleural effusions. Electronically Signed   By: Lorriane Shire M.D.   On: 12/22/2015 13:06   Dg Chest Port 1 View  Result Date: 01/09/2016 CLINICAL DATA:  Shortness of  Breath EXAM: PORTABLE CHEST 1 VIEW COMPARISON:  12/22/2015 FINDINGS: Prior CABG. Mild cardiomegaly. Mild vascular congestion. No confluent opacities or effusions. Mild hyperinflation/COPD. IMPRESSION: Cardiomegaly with mild vascular congestion. COPD. Electronically Signed   By: Rolm Baptise M.D.   On: 01/09/2016 15:46   Disposition   Pt is being discharged home today in good condition.   Follow-up Plans & Appointments    Follow-up Information    Richardson Dopp, PA-C Follow up on 01/17/2016.   Specialties:  Cardiology, Physician Assistant Why:  at 2:45pm for your follow up appt. Contact information: [id number] [place] [address] [phone]        [facility] Follow up.   Why:  They will continue to provide home health care services at your  home Contact information: [address] [place] [address] [phone]          Discharge Instructions    ([place]) Call MD:  Anytime you have any of the following symptoms: 1) 3 pound weight gain in 24 hours or 5 pounds in 1 week 2) shortness of breath, with or without a dry hacking cough 3) swelling in the hands, feet or stomach 4) if you have to sleep on extra pillows at night in order to breathe.    Complete by:  As directed    Diet - low sodium heart healthy    Complete by:  As directed    For home use only DME oxygen    Complete by:  As directed    Mode or (Route):  Nasal cannula   Liters per Minute:  2   Frequency:  Continuous (stationary and portable oxygen unit needed)   Oxygen delivery system:  Gas   Increase activity slowly    Complete by:  As directed       Discharge Medications   Current Discharge Medication List    CONTINUE these medications which have CHANGED   Details  amLODipine (NORVASC) 10 MG tablet Take 0.5 tablets (5 mg total) by mouth daily. Qty: 90 tablet, Refills: 2    carvedilol (COREG) 3.125 MG tablet Take 2 tablets (6.25 mg total) by mouth 2 (two) times daily. Qty: 180 tablet, Refills: 3    furosemide (LASIX) 40 MG tablet Take 1 tablet (40 mg total) by mouth daily. Qty: 30 tablet, Refills: 6    warfarin (COUMADIN) 1 MG tablet Take 4 tablets (4 mg total) by mouth daily at 6 PM. Qty: 100 tablet, Refills: 6      CONTINUE these medications which have NOT CHANGED   Details  aspirin EC 325 MG tablet Take 325 mg by mouth daily.    atorvastatin (LIPITOR) 80 MG tablet Take 1 tablet (80 mg total) by mouth daily. Qty: 90 tablet, Refills: 3   Associated Diagnoses: Pure hypercholesterolemia    budesonide (PULMICORT) 0.5 MG/2ML nebulizer solution Take 0.5 mg by nebulization daily.     cholecalciferol (VITAMIN D) 1000 units tablet Take 1,000 Units by mouth daily.    diphenhydramine-acetaminophen (TYLENOL PM) 25-500 MG TABS  tablet Take 2 tablets by mouth at bedtime as needed (for sleep).     fenofibrate 54 MG tablet Take 54 mg by mouth daily.    Glucosamine-Chondroit-Vit C-Mn (GLUCOSAMINE CHONDR 1500 COMPLX PO) Take 2 tablets by mouth daily.     hydrALAZINE (APRESOLINE) 50 MG tablet Take 75 mg by mouth 3 (three) times daily.    isosorbide mononitrate (IMDUR) 30 MG 24 hr tablet Take 1  tablet (30 mg total) by mouth daily. Qty: 90 tablet, Refills: 3   Associated Diagnoses: Chronic coronary artery disease; Other chest pain    losartan (COZAAR) 100 MG tablet Take 1 tablet (100 mg total) by mouth daily. Qty: 90 tablet, Refills: 2    niacin (NIASPAN) 1000 MG CR tablet Take 1,000 mg by mouth at bedtime.    TOUJEO SOLOSTAR 300 UNIT/ML SOPN Inject 30 Units into the skin at bedtime.     traZODone (DESYREL) 50 MG tablet Take 100 mg by mouth at bedtime.     levocetirizine (XYZAL) 5 MG tablet Take 0.5 tablets (2.5 mg total) by mouth every evening. Qty: 30 tablet, Refills: 5   Associated Diagnoses: Other allergic rhinitis; Seasonal allergic conjunctivitis         Outstanding Labs/Studies   BMET, INR at follow up visit.   Duration of Discharge Encounter   Greater than 30 minutes including physician time.  Signed, Reino Bellis NP-C 01/12/2016, 2:16 PM  The patient has been seen in conjunction with Reino Bellis, NP-C. All aspects of care have been considered and discussed. The patient has been personally interviewed, examined, and all clinical data has been reviewed.   Weight down 4 pounds at discharge.  Mildly volume contracted today. Furosemide was given earlier today. Plan discharge with Furosemide on hold tomorrow and to resume at 40 mg daily on 01/14/16.  As noted above, also slightly adjusted the warfarin.  Will need electrical conversion to NSR when > 4 weeks of documented therapeutic INR's >2.0. Need to consider adding amiodarone prior to cardioversion to enhance stability of NSR.

## 2016-01-12 LAB — BASIC METABOLIC PANEL
ANION GAP: 8 (ref 5–15)
BUN: 33 mg/dL — ABNORMAL HIGH (ref 6–20)
CALCIUM: 8.7 mg/dL — AB (ref 8.9–10.3)
CO2: 32 mmol/L (ref 22–32)
Chloride: 99 mmol/L — ABNORMAL LOW (ref 101–111)
Creatinine, Ser: 1.56 mg/dL — ABNORMAL HIGH (ref 0.61–1.24)
GFR calc Af Amer: 47 mL/min — ABNORMAL LOW (ref >60)
GFR, EST NON AFRICAN AMERICAN: 41 mL/min — AB (ref >60)
GLUCOSE: 103 mg/dL — AB (ref 65–99)
POTASSIUM: 4.1 mmol/L (ref 3.5–5.1)
SODIUM: 139 mmol/L (ref 135–145)

## 2016-01-12 LAB — GLUCOSE, CAPILLARY
GLUCOSE-CAPILLARY: 115 mg/dL — AB (ref 65–99)
Glucose-Capillary: 147 mg/dL — ABNORMAL HIGH (ref 65–99)

## 2016-01-12 LAB — PROTIME-INR
INR: 1.81
PROTHROMBIN TIME: 21.3 s — AB (ref 11.4–15.2)

## 2016-01-12 LAB — MAGNESIUM: MAGNESIUM: 2.1 mg/dL (ref 1.7–2.4)

## 2016-01-12 MED ORDER — FUROSEMIDE 40 MG PO TABS: 40.0000 mg | ORAL_TABLET | Freq: Every day | ORAL | 6 refills | Status: DC

## 2016-01-12 MED ORDER — WARFARIN SODIUM 2 MG PO TABS
4.0000 mg | ORAL_TABLET | Freq: Every day | ORAL | Status: DC
  Administered 2016-01-12: 4 mg via ORAL
  Filled 2016-01-12: qty 2

## 2016-01-12 MED ORDER — WARFARIN SODIUM 1 MG PO TABS: 4.0000 mg | ORAL_TABLET | Freq: Every day | ORAL | 6 refills | Status: DC

## 2016-01-12 MED ORDER — WARFARIN SODIUM 2 MG PO TABS: 4.0000 mg | ORAL_TABLET | Freq: Every day | ORAL | Status: DC

## 2016-01-12 MED ORDER — WARFARIN SODIUM 5 MG PO TABS: 5.0000 mg | ORAL_TABLET | Freq: Once | ORAL | Status: DC

## 2016-01-12 MED ORDER — CARVEDILOL 3.125 MG PO TABS: 6.2500 mg | ORAL_TABLET | Freq: Two times a day (BID) | ORAL | 3 refills | Status: DC

## 2016-01-12 MED ORDER — FUROSEMIDE 40 MG PO TABS: 40.0000 mg | ORAL_TABLET | Freq: Every day | ORAL | Status: DC

## 2016-01-12 MED ORDER — AMLODIPINE BESYLATE 10 MG PO TABS: 5.0000 mg | ORAL_TABLET | Freq: Every day | ORAL | 2 refills | Status: DC

## 2016-01-12 NOTE — Evaluation (Signed)
Physical Therapy Evaluation Patient Details Name: Mark Rivers MRN: [id number] DOB: [birth year] Today's Date: 01/12/2016   History of Present Illness  Mark Rivers a 82 y.o.malebrought in by family, with extensive PMHx, including chronic CHF, DM, HTN, HLD, NSTEMI, and respiratory failure, who presented to the Emergency Department at [facility] complaining of acute on chronic, worsening SOB for the last several weeks. He had triple bypass surgery on 10/27/15 (emergent) with a complicated post surgical course complicated by prolonged vent dependence,post OP afib. He went home on 12/18/15  Clinical Impression  Pt admitted with above diagnosis. Pt currently with functional limitations due to the deficits listed below (see PT Problem List). Pt will benefit from skilled PT to increase their independence and safety with mobility to allow discharge to the venue listed below.  Pt reports he is currently receiving HHPT and recommend continuing.  Pt with 7/10 R hip pain which improved as he began moving and ambulated.  Encouraged use of RW at this time.     Follow Up Recommendations Home health PT;Supervision/Assistance - 24 hour    Equipment Recommendations  None recommended by PT    Recommendations for Other Services       Precautions / Restrictions Precautions Precautions: Fall Precaution Comments: monitor o2      Mobility  Bed Mobility Overal bed mobility: Modified Independent             General bed mobility comments: With rail  Transfers Overall transfer level: Needs assistance Equipment used: None Transfers: Sit to/from Stand Sit to Stand: Min guard         General transfer comment: min/guard for steadying as pt with increased sway with pt reporting stiffness in R hip. o2 87-90% on room air in sitting.  Ambulation/Gait Ambulation/Gait assistance: Min assist Ambulation Distance (Feet): 240 Feet Assistive device: Rolling walker (2 wheeled);None Gait  Pattern/deviations: Antalgic;Step-through pattern     General Gait Details: Pt with antalgic gait in room with obvious discomfort, so brought RW to pt and he demonstrated improved gait pattern with o2 intact at 2 L/min  Stairs            Wheelchair Mobility    Modified Rankin (Stroke Patients Only)       Balance Overall balance assessment: Needs assistance Sitting-balance support: Feet supported Sitting balance-Leahy Scale: Good       Standing balance-Leahy Scale: Fair Standing balance comment: Did better with UE support, but able to stand without it                             Pertinent Vitals/Pain Pain Assessment: 0-10 Pain Score: 7  Pain Location: R hip Pain Descriptors / Indicators: Aching;Sore Pain Intervention(s): Monitored during session;Repositioned;Limited activity within patient's tolerance    Home Living Family/patient expects to be discharged to:: Private residence Living Arrangements: Spouse/significant other;Children   Type of Home: House Home Access: Level entry     Home Layout: One level Home Equipment: Environmental consultant - 2 wheels;Walker - 4 wheels;Shower seat - built in Additional Comments: currently receiving HHPT services, son here for 10 more days then brother will be coming.  Per chart, wife is on hospice    Prior Function Level of Independence: Independent with assistive device(s)         Comments: Amb without AD household distances, but uses RW for longer distances     Hand Dominance        Extremity/Trunk Assessment   Upper Extremity  Assessment Upper Extremity Assessment: Overall WFL for tasks assessed    Lower Extremity Assessment Lower Extremity Assessment: RLE deficits/detail;Overall WFL for tasks assessed RLE Deficits / Details: Pt with c/o soreness, which improved as gait progressed    Cervical / Trunk Assessment Cervical / Trunk Assessment: Normal  Communication   Communication: HOH  Cognition  Arousal/Alertness: Awake/alert Behavior During Therapy: WFL for tasks assessed/performed Overall Cognitive Status: Within Functional Limits for tasks assessed                      General Comments      Exercises     Assessment/Plan    PT Assessment Patient needs continued PT services  PT Problem List Decreased strength;Decreased activity tolerance;Pain;Decreased mobility;Decreased knowledge of use of DME          PT Treatment Interventions DME instruction;Gait training;Functional mobility training;Therapeutic activities;Therapeutic exercise    PT Goals (Current goals can be found in the Care Plan section)  Acute Rehab PT Goals Patient Stated Goal: To go home PT Goal Formulation: With patient Time For Goal Achievement: 01/26/16 Potential to Achieve Goals: Good    Frequency Min 3X/week   Barriers to discharge        Co-evaluation               End of Session Equipment Utilized During Treatment: Gait belt;Oxygen Activity Tolerance: Patient tolerated treatment well Patient left: in chair;with call bell/phone within reach Nurse Communication: Mobility status         Time: 1152-1221 PT Time Calculation (min) (ACUTE ONLY): 29 min   Charges:   PT Evaluation $PT Eval Moderate Complexity: 1 Procedure PT Treatments $Gait Training: 8-22 mins   PT G Codes:        Sherrelle Prochazka LUBECK 01/12/2016, 12:41 PM

## 2016-01-12 NOTE — Progress Notes (Signed)
Patient with 7 beats of V-tach. Patient asymptomatic and asleep. MD notified. New orders noted.

## 2016-01-12 NOTE — Progress Notes (Addendum)
Patient Name: Mark Rivers Date of Encounter: 01/12/2016  Primary Cardiologist: [facility] Problem List     Principal Problem:   Acute on chronic heart failure with normal ejection fraction [facility]) Active Problems:   Diabetes mellitus ([place])   Persistent atrial fibrillation (HCC)   Coronary artery disease involving native heart without angina pectoris   S/P CABG x 3   Chronic diastolic CHF (congestive heart failure) (HCC)     Subjective   Feels better. Inquiring about going home. Lying flat in bed without dyspnea.  Inpatient Medications    Scheduled Meds: . amLODipine  5 mg Oral Daily  . aspirin EC  325 mg Oral Daily  . atorvastatin  80 mg Oral Daily  . budesonide  0.5 mg Nebulization Daily  . carvedilol  6.25 mg Oral BID WC  . cholecalciferol  1,000 Units Oral Daily  . fenofibrate  54 mg Oral Daily  . [START ON 01/14/2016] furosemide  40 mg Oral Daily  . hydrALAZINE  75 mg Oral TID  . insulin aspart  0-9 Units Subcutaneous TID WC  . insulin glargine  30 Units Subcutaneous QHS  . isosorbide mononitrate  30 mg Oral Daily  . loratadine  5 mg Oral QPM  . losartan  100 mg Oral Daily  . niacin  1,000 mg Oral QHS  . sodium chloride flush  3 mL Intravenous Q12H  . traZODone  100 mg Oral QHS  . warfarin  3 mg Oral q1800  . Warfarin - Physician Dosing Inpatient   Does not apply q1800   Continuous Infusions:  PRN Meds: sodium chloride, acetaminophen, ondansetron (ZOFRAN) IV, sodium chloride flush   Vital Signs    Vitals:   01/11/16 2101 01/11/16 2159 01/12/16 0529 01/12/16 0833  BP: 99/60 (!) 141/58 128/65   Pulse: 94  87   Resp: 18  18   Temp: 98 F (36.7 C)  98.2 F (36.8 C)   TempSrc: Oral  Oral   SpO2: 95%  93% 98%  Weight:   191 lb 1.6 oz (86.7 kg)   Height:        Intake/Output Summary (Last 24 hours) at 01/12/16 1159 Last data filed at 01/12/16 1100  Gross per 24 hour  Intake              563 ml  Output             1175 ml  Net              -612 ml   Filed Weights   01/10/16 0528 01/11/16 0453 01/12/16 0529  Weight: 194 lb 12.8 oz (88.4 kg) 190 lb 14.4 oz (86.6 kg) 191 lb 1.6 oz (86.7 kg)    Physical Exam  Comfortable, and mildly dyspneic during conversation. GEN: Well nourished, well developed, in no acute distress.  HEENT: Grossly normal.  Neck: Supple, carotid bruits, or masses.. JVD present bilaterally. Cardiac: irregular, 1-2/6 aortic ejection murmur, no diatolic murmurs, rubs, or gallops. No clubbing, cyanosis. 1+ ankle edema.  Radials/DP/PT 2+ and equal bilaterally.  Respiratory:  Respirations regular and unlabored, clear to auscultation bilaterally. GI: Soft, nontender, nondistended, BS + x 4. MS: no deformity or atrophy. Skin: warm and dry, no rash. Neuro:  Strength and sensation are intact. Psych: AAOx3.  Normal affect.  Labs    CBC  Recent Labs  01/09/16 1445 01/10/16 0517  WBC 12.3* 11.4*  NEUTROABS 9.0*  --   HGB 9.2* 9.4*  HCT 30.5* 31.2*  MCV 96.8 93.7  PLT 401* 99991111   Basic Metabolic Panel  Recent Labs  01/11/16 0508 01/12/16 0410  NA 137 139  K 3.6 4.1  CL 100* 99*  CO2 27 32  GLUCOSE 103* 103*  BUN 24* 33*  CREATININE 1.23 1.56*  CALCIUM 8.5* 8.7*  MG  --  2.1   Liver Function Tests  Recent Labs  01/09/16 1445  AST 25  ALT 19  ALKPHOS 80  BILITOT 0.3  PROT 7.3  ALBUMIN 3.3*   No results for input(s): LIPASE, AMYLASE in the last 72 hours. Cardiac Enzymes  Recent Labs  01/09/16 1445  TROPONINI 0.06*    Telemetry    AF with VR around 90-100 bpm.  - Personally Reviewed  ECG    No new tracings since the last note.  Radiology    No results found.  Cardiac Studies   Echo pending  Patient Profile     82 yo with CHF (preserved EF), CAD s/p CABG in Oct 0000000 (complicated by prolonged respiratory failure), in persistent atrial fibrillation since postop period, admitted with acute HF exacerbation, improving with diuresis.  Assessment & Plan    1. Acute on  chronic HF: Diuresis now totaling 4 L. Plan to ambulate and if stable discharged today. Daily diuretic regimen will be furosemide 40 mg daily starting on 01/14/16.Will need home oxygen. Oxygen decreases to 89% with ambulation.  2. Persistent atrial fibrillation: on warfarin with subtherapeutic INR of 1.7 today. Atrial fibrillation developed post ordinary bypass grafting. Will need cardioversion when adequately anticoagulated for 3-6 weeks with INR greater than 2.0. Rate control currently achieved with carvedilol. Not on amiodarone as suggested by Dr. Sallyanne Kuster.  3. CAD s/p CABG: emergency surgery on 10/27/15. Angina free. On statin.  4. DM: excellent glycemic control.  5. Acute kidney injury with IV diuresis. creatinine today is up to 1.54. Did receive a.m. oral furosemide dose today. Plan to skip furosemide tomorrow and resume on 01/14/16.  6. Chronic anticoagulation: Since admission, the INR has drifted down to 1.8 (was as low as 1.7 yesterday). On admission it was 2.18. Plan to increase Coumadin to 4 mg daily. Will need INR checked on 01/16/2016.   Signed, Sinclair Grooms, MD  01/12/2016, 11:59 AM

## 2016-01-12 NOTE — Progress Notes (Signed)
Pt has orders to be discharged. Discharge instructions given and pt has no additional questions at this time. Medication regimen reviewed and pt educated. Pt verbalized understanding and has no additional questions. Telemetry box removed. IV removed and site in good condition. Pt stable and waiting for transportation.   Lawernce Earll RN 

## 2016-01-13 ENCOUNTER — Telehealth: Payer: Self-pay | Admitting: Cardiovascular Disease

## 2016-01-13 ENCOUNTER — Telehealth: Payer: Self-pay | Admitting: Pharmacist

## 2016-01-13 NOTE — Telephone Encounter (Signed)
Received phone call from [place] with [facility] - pt was recently hospitalized and she needed another order to resume checking patient's INRs. She had already left pt's home for today and pt had an INR checked in the hospital yesterday - subtherapeutic at 1.8. Pt had been taking 3mg  daily prior to hospitalization and was discharged on 4mg  daily with a different tablet strength. Gave order to check INR tomorrow and will have [place] call while in patient's home so we can confirm tablet strength. Plan is for pt to be cardioverted after INR is therapeutic for 3 weeks.

## 2016-01-14 ENCOUNTER — Ambulatory Visit (INDEPENDENT_AMBULATORY_CARE_PROVIDER_SITE_OTHER): Payer: Medicare Other | Admitting: Pharmacist

## 2016-01-14 DIAGNOSIS — Z5181 Encounter for therapeutic drug level monitoring: Secondary | ICD-10-CM

## 2016-01-14 DIAGNOSIS — I481 Persistent atrial fibrillation: Secondary | ICD-10-CM

## 2016-01-14 DIAGNOSIS — Z7901 Long term (current) use of anticoagulants: Secondary | ICD-10-CM

## 2016-01-14 DIAGNOSIS — I4819 Other persistent atrial fibrillation: Secondary | ICD-10-CM

## 2016-01-14 LAB — POCT INR: INR: 2.4

## 2016-01-17 ENCOUNTER — Encounter: Payer: Self-pay | Admitting: Physician Assistant

## 2016-01-17 ENCOUNTER — Ambulatory Visit (INDEPENDENT_AMBULATORY_CARE_PROVIDER_SITE_OTHER): Payer: Medicare Other | Admitting: Physician Assistant

## 2016-01-17 ENCOUNTER — Ambulatory Visit (INDEPENDENT_AMBULATORY_CARE_PROVIDER_SITE_OTHER): Payer: Medicare Other | Admitting: *Deleted

## 2016-01-17 VITALS — BP 80/50 | HR 81 | Ht 67.0 in | Wt 193.8 lb

## 2016-01-17 DIAGNOSIS — I481 Persistent atrial fibrillation: Secondary | ICD-10-CM

## 2016-01-17 DIAGNOSIS — I5032 Chronic diastolic (congestive) heart failure: Secondary | ICD-10-CM

## 2016-01-17 DIAGNOSIS — N183 Chronic kidney disease, stage 3 unspecified: Secondary | ICD-10-CM

## 2016-01-17 DIAGNOSIS — I4819 Other persistent atrial fibrillation: Secondary | ICD-10-CM

## 2016-01-17 DIAGNOSIS — I10 Essential (primary) hypertension: Secondary | ICD-10-CM

## 2016-01-17 DIAGNOSIS — Z5181 Encounter for therapeutic drug level monitoring: Secondary | ICD-10-CM

## 2016-01-17 DIAGNOSIS — Z7901 Long term (current) use of anticoagulants: Secondary | ICD-10-CM

## 2016-01-17 DIAGNOSIS — E7800 Pure hypercholesterolemia, unspecified: Secondary | ICD-10-CM

## 2016-01-17 DIAGNOSIS — I2510 Atherosclerotic heart disease of native coronary artery without angina pectoris: Secondary | ICD-10-CM | POA: Diagnosis not present

## 2016-01-17 LAB — POCT INR: INR: 3.5

## 2016-01-17 MED ORDER — AMIODARONE HCL 200 MG PO TABS: 200.0000 mg | ORAL_TABLET | ORAL | 3 refills | Status: DC

## 2016-01-17 NOTE — Patient Instructions (Addendum)
Medication Instructions:  1. WHEN YOU GET HOME PLEASE CALL THE OFFICE TO LET us KNOW WHAT DOSE OF AMLODIPINE YOU HAVE BEEN TAKING; THE 5 MG ? OR THE 10 MG ? DO NOT TAKE ANY AMLODIPINE UNTIL YOU TALK TO OUR OFFICE FURTHER. 2. START AMIODARONE 200 MG TWICE DAILY FOR 2 WEEKS THEN CHANGE TO 200 MG ONCE A DAY  Labwork: 1. TODAY BMET  Testing/Procedures: NONE  Follow-Up: 02/16/16 @ 2:45 WITH SCOTT WEAVER, PAC   Any Other Special Instructions Will Be Listed Below (If Applicable). 1. WHEN YOU COME IN FOR YOUR COUMADIN CHECK NEXT WEEK PER SCOTT WEAVER, PAC TO HAVE THEM CHECK YOUR BLOOD PRESSURE 2. YOU WILL NEED YOUR INR CHECKED WEEKLY DUE TO NEW START AMIODARONE 01/17/16 WITH THE PLAN FOR A CARDIOVERSION ONCE YOUR INR HAS BEEN ABOVE 2 FOR 1 MONTH 3. PLEASE HAVE THE HOME HEALTH NURSE CHECK YOUR BLOOD PRESSURE CUFF WITH HER OWN CUFF AS WELL AS YOUR CUFF  If you need a refill on your cardiac medications before your next appointment, please call your pharmacy.

## 2016-01-17 NOTE — Progress Notes (Addendum)
Cardiology Office Note:    Date:  01/17/2016   ID:  Mark Rivers, DOB 08-16-1934, MRN [id number]  PCP:  [name]., MD  Cardiologist:  Dr. Jenkins Rouge   Electrophysiologist:  n/a Nephrology: Dr. Olivia Mackie Phoebe Perch)  Referring MD: [name], MD   Chief Complaint  Patient presents with  . Hospitalization Follow-up    admx with CHF    History of Present Illness:    Mark Rivers is a 82 y.o. male with a hx of CAD, persistent AFib, DM2, CKD, HTN, HL, COPD.  He underwent remote angioplasty in 1988.  He was admitted in 10/17 with NSTEMI in the setting of AF with RVR.  He had critical LM stenosis on LHC and underwent emergent CABG (L-LAD, S-OM2, S-PDA).  He had a complex post op course notable for blood loss anemia requiring transfusion with PRBCs, difficulty in weaning from the ventilator resulting in tracheostomy, chest tube placement for R pleural effusion, MRSE bacteremia and AKI which limited diuresis.  He was placed on Coumadin for recurrent AF.  He was DC to [facility].  I saw him in follow up after DC to home in 12/2015.  He was fairly stable at that time.  He remained in atrial fibrillation.  I felt that DCCV could be considered after 3-4 weeks of uninterrupted, therapeutic anticoagulation.  There was some confusion as to who was managing his Coumadin, but that was ultimately set up to be followed here.  We were contacted a few weeks ago by HHPT who noted his HR was increasing to the 140s with activity.  He was not taking his beta-blocker as prescribed and this was resumed.    He was then admitted 12/31-1/3 with acute on chronic diastolic CHF.  He was tx with IV Lasix.  DC weight was 191.  He was DC on Lasix 40 QD.    He returns for follow up.  He is here with his son.  He is feeling better.  His breathing is improved.  He denies any orthopnea, PND.  LE edema is improved.  He has a mild non-productive cough.  He denies any chest pain.  He denies syncope or  dizziness.  He denies any bleeding issues.  He does note that he was eating a lot of salty foods and has changed his diet.  Weights at home since DC have been stable.    Prior CV studies that were reviewed today include:    Echo 01/11/16 Mod LVH, EF 55-60, inf AK, suspect mild aortic stenosis, mod MAC, mod LAE, mild reduced RVSF, mild RAE, PASP 47  Limited echo 11/09/15 EF 50-55, normal wall motion, ventricular septal dyssynergy, moderate MAC, mild MR, possible fibrinous linear projection originating from mitral annular calcification within LA  Complete echo 11/02/15 Moderate LVH, EF 55-60, normal wall motion, grade 2 diastolic dysfunction, mild aortic stenosis (mean 14, peak 34), MAC, mild LAE  LHC 10/26/15 LM distal 99 thrombotic LAD okay LCx okay RCA proximal 80, mid 30 EF 40-45  Myoview 10/16 EF 51, no ischemia or scar; Low Risk  Echo 6/14 Mild to moderate LVH, probable mild to moderately reduced LVEF, moderate LAE, MAC, mild MR, mild TR  Myoview 6/14 No ischemia, EF 49  Past Medical History:  Diagnosis Date  . Allergy   . Back pain   . Chronic diastolic CHF (congestive heart failure) ([place]) 12/21/2015   A. Echo 10/17: Moderate LVH, EF 55-60, normal wall motion, grade 2 diastolic dysfunction, mild aortic stenosis (mean  14, peak 34), MAC, mild LAE    . CKD (chronic kidney disease) stage 3, GFR 30-59 ml/min 03/04/2014  . Coronary artery disease    a. s/p remote POBA in 1988 // b. NSTEMI in 10/17 >> LHC with 99% LM stenosis >> emergent CABG (L-LAD, S-OM2, S-PDA) - post op course complicated (prolonged ventilation, s/p Trach, AFib, anemia req transfusion, bacteremia >> DC to LTAC)  . Diabetes mellitus   . GERD (gastroesophageal reflux disease)   . History of nuclear stress test    a. Myoview 6/14 - EF 51, no ischemia or scar; Low Risk  . Hyperlipemia 09/11/2013   Last Assessment & Plan:  Lipid abnormalities are improving with treatment. Nutritional counseling was provided.  and Pharmacotherapy as ordered. Lipids will be reassessed in 3 months.  . Hypertension   . Neuropathy ([place])   . Persistent atrial fibrillation ([place]) 10/25/2015    Past Surgical History:  Procedure Laterality Date  . CARDIAC CATHETERIZATION N/A 10/26/2015   Procedure: Left Heart Cath and Coronary Angiography;  Surgeon: Troy Sine, MD;  Location: [place] [facility];  Service: Cardiovascular;  Laterality: N/A;  . cataract surg     bil/2008  . COLONOSCOPY    . CORONARY ARTERY BYPASS GRAFT N/A 10/26/2015   Procedure: CORONARY ARTERY BYPASS GRAFTING (CABG) x3(LIMA to LA, SVG to OM1, SVG to PDA) with EVH from the left thigh and partial lower leg greater saphenous vein and left internal mammary artery;  Surgeon: Ivin Poot, MD;  Location: [facility];  Service: Open Heart Surgery;  Laterality: N/A;  . CORONARY STENT PLACEMENT     1988  . TEE WITHOUT CARDIOVERSION N/A 10/26/2015   Procedure: TRANSESOPHAGEAL ECHOCARDIOGRAM (TEE);  Surgeon: Ivin Poot, MD;  Location: [place];  Service: Open Heart Surgery;  Laterality: N/A;  . TONSILLECTOMY      Current Medications: Current Meds  Medication Sig  . cholecalciferol (VITAMIN D) 1000 units tablet Take 1,000 Units by mouth daily.  . fenofibrate 54 MG tablet Take 54 mg by mouth daily.  . Glucosamine-Chondroit-Vit C-Mn (GLUCOSAMINE CHONDR 1500 COMPLX PO) Take 2 tablets by mouth daily.   . OXYGEN Inhale 2 L/min into the lungs continuous.  Nelva Nay SOLOSTAR 300 UNIT/ML SOPN Inject 30 Units into the skin at bedtime.   . traZODone (DESYREL) 100 MG tablet Take 100 mg by mouth at bedtime.   . [DISCONTINUED] amLODipine (NORVASC) 10 MG tablet Take 0.5 tablets (5 mg total) by mouth daily.  . [DISCONTINUED] aspirin EC 81 MG tablet Take 81 mg by mouth daily.   . [DISCONTINUED] atorvastatin (LIPITOR) 80 MG tablet Take 1 tablet (80 mg total) by mouth daily.  . [DISCONTINUED] budesonide (PULMICORT) 0.5 MG/2ML nebulizer solution Take 0.5 mg by nebulization  daily.   . [DISCONTINUED] carvedilol (COREG) 3.125 MG tablet Take 2 tablets (6.25 mg total) by mouth 2 (two) times daily. (Patient not taking: Reported on 02/16/2016)  . [DISCONTINUED] diphenhydramine-acetaminophen (TYLENOL PM) 25-500 MG TABS tablet Take 2 tablets by mouth at bedtime as needed (for sleep).   . [DISCONTINUED] furosemide (LASIX) 40 MG tablet Take 1 tablet (40 mg total) by mouth daily.  . [DISCONTINUED] hydrALAZINE (APRESOLINE) 50 MG tablet Take 75 mg by mouth 3 (three) times daily.  . [DISCONTINUED] isosorbide mononitrate (IMDUR) 30 MG 24 hr tablet Take 1 tablet (30 mg total) by mouth daily.  . [DISCONTINUED] levocetirizine (XYZAL) 5 MG tablet Take 0.5 tablets (2.5 mg total) by mouth every evening.  . [DISCONTINUED] losartan (COZAAR)  100 MG tablet Take 1 tablet (100 mg total) by mouth daily.  . [DISCONTINUED] niacin (NIASPAN) 1000 MG CR tablet Take 1,000 mg by mouth at bedtime.  . [DISCONTINUED] warfarin (COUMADIN) 1 MG tablet Take 4 tablets (4 mg total) by mouth daily at 6 PM. (Patient taking differently: Take 2-3 mg by mouth See admin instructions. Take 3 mg by mouth on Sunday and Thursday. Take 2 mg by mouth on all other days)     Allergies:   Amoxicillin; Cephalexin; and Penicillin g   Social History   Social History  . Marital status: Married    Spouse name: N/A  . Number of children: N/A  . Years of education: N/A   Occupational History  . retired    Social History Main Topics  . Smoking status: Former Smoker    Packs/day: 2.00    Years: 30.00    Types: Cigarettes    Quit date: 06/28/2004  . Smokeless tobacco: Never Used  . Alcohol use No     Comment: QUIT 06/28/2004  . Drug use: No  . Sexual activity: Yes   Other Topics Concern  . None   Social History Narrative  . None     Family History:  The patient's family history includes Diabetes in his father.   ROS:   Please see the history of present illness.    Review of Systems  Cardiovascular: Positive  for dyspnea on exertion and irregular heartbeat.  Respiratory: Positive for shortness of breath.   Neurological: Positive for loss of balance.   All other systems reviewed and are negative.   EKGs/Labs/Other Test Reviewed:    EKG:  EKG is  ordered today.  The ekg ordered today demonstrates AFib, HR 81, PRWP, NSSTTW changes, no change from prior tracing  Recent Labs: 11/27/2015: TSH 2.639 01/09/2016: ALT 19; B Natriuretic Peptide 404.3 01/10/2016: Hemoglobin 9.4; Platelets 365 01/12/2016: BUN 33; Creatinine, Ser 1.56; Magnesium 2.1; Potassium 4.1; Sodium 139   Recent Lipid Panel    Component Value Date/Time   TRIG 268 (H) 11/05/2015 1325    Physical Exam:    VS:  BP (!) 80/50   Pulse 81   Ht _0  (1.702 m)   Wt 193 lb 12.8 oz (87.9 kg)   SpO2 96%   BMI 30.35 kg/m     Wt Readings from Last 3 Encounters:  01/17/16 193 lb 12.8 oz (87.9 kg)  01/12/16 191 lb 1.6 oz (86.7 kg)  12/22/15 194 lb (88 kg)     Physical Exam  Constitutional: He is oriented to person, place, and time. He appears well-developed and well-nourished. No distress.  HENT:  Head: Normocephalic and atraumatic.  Eyes: No scleral icterus.  Neck: No JVD present.  Cardiovascular: Normal rate.  An irregularly irregular rhythm present.  No murmur heard. Pulmonary/Chest: Effort normal. He has no wheezes. He has no rales.  Abdominal: There is no tenderness.  Musculoskeletal: He exhibits edema.  Trace bilateral LE edema  Neurological: He is alert and oriented to person, place, and time.  Skin: Skin is warm and dry.  Psychiatric: He has a normal mood and affect.    ASSESSMENT:    1. Coronary artery disease involving native coronary artery of native heart without angina pectoris   2. Chronic diastolic CHF (congestive heart failure) (HCC)   3. Persistent atrial fibrillation ([place])   4. Benign essential hypertension   5. Pure hypercholesterolemia   6. CKD (chronic kidney disease) stage 3, GFR 30-59 ml/min  PLAN:    In order of problems listed above:  1. CAD - S/p NSTEMI in 10/17 in the setting of AF with RVR with critical LM stenosis tx with emergent CABG.  He is doing well without anginal symptoms.  Continue ASA, statin, nitrates.  He continues to work with [employer].  2. Chronic Diastolic CHF - Volume stable since DC.  Continue current dose of Lasix.  Check BMET today.  He is doing a better job of limiting his dietary sodium.  3. Persistent AF - CHADS2-VASc=6 (age > 49, vascular dz, HTN, DM, CHF).  He will need long term anticoagulation with Coumadin.  He remains in AF with controlled rate.  I d/w Dr. Johnsie Cancel.  He will likely need an antiarrhythmic drug to maintain NSR.    -  Continue INR checks q week  -  Start Amiodarone 200 bid x 2 weeks, then 200 QD  -  Plan DCCV once INR therapeutic 3-4 weeks  4. HTN - BP is low today.  He is not symptomatic.  DC Amlodipine (he may be taking 10 mg instead of 5 mg QD >> he will call us to confirm).  Recheck BP at FU with CVRR next week. I have asked him to get his machine checked with the [facility] when she comes out again.   5. HL - Continue statin.   6. CKD -  Repeat BMET today.  Medication Adjustments/Labs and Tests Ordered: Current medicines are reviewed at length with the patient today.  Concerns regarding medicines are outlined above.  Medication changes, Labs and Tests ordered today are outlined in the Patient Instructions noted below. Patient Instructions  Medication Instructions:  1. WHEN YOU GET HOME PLEASE CALL THE OFFICE TO LET us KNOW WHAT DOSE OF AMLODIPINE YOU HAVE BEEN TAKING; THE 5 MG ? OR THE 10 MG ? DO NOT TAKE ANY AMLODIPINE UNTIL YOU TALK TO OUR OFFICE FURTHER. 2. START AMIODARONE 200 MG TWICE DAILY FOR 2 WEEKS THEN CHANGE TO 200 MG ONCE A DAY  Labwork: 1. TODAY BMET  Testing/Procedures: NONE  Follow-Up: 02/16/16 @ 2:45 WITH Milind Raether, PAC   Any Other Special Instructions Will Be Listed Below (If Applicable). 1. WHEN YOU COME IN  FOR YOUR COUMADIN CHECK NEXT WEEK PER Arion Morgan, PAC TO HAVE THEM CHECK YOUR BLOOD PRESSURE 2. YOU WILL NEED YOUR INR CHECKED WEEKLY DUE TO NEW START AMIODARONE 01/17/16 WITH THE PLAN FOR A CARDIOVERSION ONCE YOUR INR HAS BEEN ABOVE 2 FOR 1 MONTH 3. PLEASE HAVE THE HOME HEALTH NURSE CHECK YOUR BLOOD PRESSURE CUFF WITH HER OWN CUFF AS WELL AS YOUR CUFF  If you need a refill on your cardiac medications before your next appointment, please call your pharmacy.  Signed, Richardson Dopp, PA-C  01/17/2016 4:13 PM    [facility] [address], [place], [place]  [address] Phone: [phone]; Fax: [phone]

## 2016-01-18 ENCOUNTER — Telehealth: Payer: Self-pay | Admitting: *Deleted

## 2016-01-18 DIAGNOSIS — I5032 Chronic diastolic (congestive) heart failure: Secondary | ICD-10-CM

## 2016-01-18 LAB — BASIC METABOLIC PANEL
BUN / CREAT RATIO: 31 — AB (ref 10–24)
BUN: 58 mg/dL — AB (ref 8–27)
CO2: 24 mmol/L (ref 18–29)
CREATININE: 1.87 mg/dL — AB (ref 0.76–1.27)
Calcium: 9.8 mg/dL (ref 8.6–10.2)
Chloride: 98 mmol/L (ref 96–106)
GFR calc non Af Amer: 33 mL/min/{1.73_m2} — ABNORMAL LOW (ref >59)
GFR, EST AFRICAN AMERICAN: 39 mL/min/{1.73_m2} — AB (ref >59)
GLUCOSE: 83 mg/dL (ref 65–99)
Potassium: 5.1 mmol/L (ref 3.5–5.2)
SODIUM: 140 mmol/L (ref 134–144)

## 2016-01-18 MED ORDER — FUROSEMIDE 40 MG PO TABS: 20.0000 mg | ORAL_TABLET | Freq: Every day | ORAL | Status: DC

## 2016-01-18 NOTE — Telephone Encounter (Signed)
Pt notified of lab results and recommendations by phone with verbal understanding to plan of care. Pt agreeable to hold Losartan x 2 days then resume, hold lasix x 1 day then resume Lasix at 20 mg daily, bmet 1/17.

## 2016-01-21 ENCOUNTER — Ambulatory Visit (INDEPENDENT_AMBULATORY_CARE_PROVIDER_SITE_OTHER): Payer: 59

## 2016-01-21 DIAGNOSIS — I481 Persistent atrial fibrillation: Secondary | ICD-10-CM

## 2016-01-21 DIAGNOSIS — Z7901 Long term (current) use of anticoagulants: Secondary | ICD-10-CM

## 2016-01-21 DIAGNOSIS — Z5181 Encounter for therapeutic drug level monitoring: Secondary | ICD-10-CM

## 2016-01-21 DIAGNOSIS — I4819 Other persistent atrial fibrillation: Secondary | ICD-10-CM

## 2016-01-21 LAB — POCT INR: INR: 3.4

## 2016-01-26 ENCOUNTER — Other Ambulatory Visit: Payer: 59

## 2016-01-28 ENCOUNTER — Ambulatory Visit (INDEPENDENT_AMBULATORY_CARE_PROVIDER_SITE_OTHER): Payer: 59 | Admitting: Pharmacist

## 2016-01-28 DIAGNOSIS — I4819 Other persistent atrial fibrillation: Secondary | ICD-10-CM

## 2016-01-28 DIAGNOSIS — I481 Persistent atrial fibrillation: Secondary | ICD-10-CM

## 2016-01-28 DIAGNOSIS — Z7901 Long term (current) use of anticoagulants: Secondary | ICD-10-CM

## 2016-01-28 DIAGNOSIS — Z5181 Encounter for therapeutic drug level monitoring: Secondary | ICD-10-CM

## 2016-01-28 LAB — POCT INR: INR: 3.6

## 2016-02-04 ENCOUNTER — Ambulatory Visit (INDEPENDENT_AMBULATORY_CARE_PROVIDER_SITE_OTHER): Payer: 59 | Admitting: Internal Medicine

## 2016-02-04 DIAGNOSIS — Z5181 Encounter for therapeutic drug level monitoring: Secondary | ICD-10-CM

## 2016-02-04 DIAGNOSIS — Z7901 Long term (current) use of anticoagulants: Secondary | ICD-10-CM

## 2016-02-04 DIAGNOSIS — I4819 Other persistent atrial fibrillation: Secondary | ICD-10-CM

## 2016-02-04 DIAGNOSIS — I481 Persistent atrial fibrillation: Secondary | ICD-10-CM

## 2016-02-04 LAB — POCT INR: INR: 3.5

## 2016-02-09 ENCOUNTER — Encounter (HOSPITAL_BASED_OUTPATIENT_CLINIC_OR_DEPARTMENT_OTHER): Payer: Self-pay

## 2016-02-09 ENCOUNTER — Inpatient Hospital Stay (HOSPITAL_BASED_OUTPATIENT_CLINIC_OR_DEPARTMENT_OTHER)
Admission: EM | Admit: 2016-02-09 | Discharge: 2016-02-13 | DRG: 377 | Disposition: A | Discharge status: Home Health | Payer: Medicare Other | Attending: Internal Medicine | Admitting: Internal Medicine

## 2016-02-09 ENCOUNTER — Emergency Department (HOSPITAL_BASED_OUTPATIENT_CLINIC_OR_DEPARTMENT_OTHER): Payer: Medicare Other

## 2016-02-09 DIAGNOSIS — K922 Gastrointestinal hemorrhage, unspecified: Secondary | ICD-10-CM

## 2016-02-09 DIAGNOSIS — E440 Moderate protein-calorie malnutrition: Secondary | ICD-10-CM | POA: Insufficient documentation

## 2016-02-09 DIAGNOSIS — I481 Persistent atrial fibrillation: Secondary | ICD-10-CM

## 2016-02-09 DIAGNOSIS — I5032 Chronic diastolic (congestive) heart failure: Secondary | ICD-10-CM | POA: Diagnosis present

## 2016-02-09 DIAGNOSIS — K2980 Duodenitis without bleeding: Secondary | ICD-10-CM | POA: Diagnosis not present

## 2016-02-09 DIAGNOSIS — R195 Other fecal abnormalities: Secondary | ICD-10-CM | POA: Diagnosis present

## 2016-02-09 DIAGNOSIS — Z87891 Personal history of nicotine dependence: Secondary | ICD-10-CM

## 2016-02-09 DIAGNOSIS — Z79899 Other long term (current) drug therapy: Secondary | ICD-10-CM

## 2016-02-09 DIAGNOSIS — J101 Influenza due to other identified influenza virus with other respiratory manifestations: Secondary | ICD-10-CM | POA: Diagnosis present

## 2016-02-09 DIAGNOSIS — I482 Chronic atrial fibrillation: Secondary | ICD-10-CM | POA: Diagnosis present

## 2016-02-09 DIAGNOSIS — R531 Weakness: Secondary | ICD-10-CM

## 2016-02-09 DIAGNOSIS — I959 Hypotension, unspecified: Secondary | ICD-10-CM | POA: Diagnosis present

## 2016-02-09 DIAGNOSIS — N170 Acute kidney failure with tubular necrosis: Secondary | ICD-10-CM

## 2016-02-09 DIAGNOSIS — Z955 Presence of coronary angioplasty implant and graft: Secondary | ICD-10-CM

## 2016-02-09 DIAGNOSIS — K3189 Other diseases of stomach and duodenum: Secondary | ICD-10-CM | POA: Diagnosis not present

## 2016-02-09 DIAGNOSIS — Z7982 Long term (current) use of aspirin: Secondary | ICD-10-CM

## 2016-02-09 DIAGNOSIS — E118 Type 2 diabetes mellitus with unspecified complications: Secondary | ICD-10-CM | POA: Diagnosis not present

## 2016-02-09 DIAGNOSIS — I130 Hypertensive heart and chronic kidney disease with heart failure and stage 1 through stage 4 chronic kidney disease, or unspecified chronic kidney disease: Secondary | ICD-10-CM | POA: Diagnosis present

## 2016-02-09 DIAGNOSIS — E785 Hyperlipidemia, unspecified: Secondary | ICD-10-CM | POA: Diagnosis present

## 2016-02-09 DIAGNOSIS — Z951 Presence of aortocoronary bypass graft: Secondary | ICD-10-CM | POA: Diagnosis not present

## 2016-02-09 DIAGNOSIS — D649 Anemia, unspecified: Secondary | ICD-10-CM

## 2016-02-09 DIAGNOSIS — D689 Coagulation defect, unspecified: Secondary | ICD-10-CM | POA: Diagnosis not present

## 2016-02-09 DIAGNOSIS — N183 Chronic kidney disease, stage 3 unspecified: Secondary | ICD-10-CM

## 2016-02-09 DIAGNOSIS — T45515A Adverse effect of anticoagulants, initial encounter: Secondary | ICD-10-CM

## 2016-02-09 DIAGNOSIS — J431 Panlobular emphysema: Secondary | ICD-10-CM | POA: Diagnosis not present

## 2016-02-09 DIAGNOSIS — I2510 Atherosclerotic heart disease of native coronary artery without angina pectoris: Secondary | ICD-10-CM | POA: Diagnosis present

## 2016-02-09 DIAGNOSIS — K31811 Angiodysplasia of stomach and duodenum with bleeding: Secondary | ICD-10-CM | POA: Diagnosis present

## 2016-02-09 DIAGNOSIS — J9611 Chronic respiratory failure with hypoxia: Secondary | ICD-10-CM | POA: Diagnosis present

## 2016-02-09 DIAGNOSIS — Z66 Do not resuscitate: Secondary | ICD-10-CM | POA: Diagnosis present

## 2016-02-09 DIAGNOSIS — R197 Diarrhea, unspecified: Secondary | ICD-10-CM

## 2016-02-09 DIAGNOSIS — K219 Gastro-esophageal reflux disease without esophagitis: Secondary | ICD-10-CM | POA: Diagnosis present

## 2016-02-09 DIAGNOSIS — N179 Acute kidney failure, unspecified: Secondary | ICD-10-CM | POA: Diagnosis not present

## 2016-02-09 DIAGNOSIS — K2981 Duodenitis with bleeding: Secondary | ICD-10-CM | POA: Diagnosis present

## 2016-02-09 DIAGNOSIS — I10 Essential (primary) hypertension: Secondary | ICD-10-CM | POA: Diagnosis not present

## 2016-02-09 DIAGNOSIS — D62 Acute posthemorrhagic anemia: Secondary | ICD-10-CM | POA: Diagnosis present

## 2016-02-09 DIAGNOSIS — I4819 Other persistent atrial fibrillation: Secondary | ICD-10-CM | POA: Diagnosis present

## 2016-02-09 DIAGNOSIS — Z7951 Long term (current) use of inhaled steroids: Secondary | ICD-10-CM

## 2016-02-09 DIAGNOSIS — E860 Dehydration: Secondary | ICD-10-CM | POA: Diagnosis present

## 2016-02-09 DIAGNOSIS — N189 Chronic kidney disease, unspecified: Secondary | ICD-10-CM

## 2016-02-09 DIAGNOSIS — Z7901 Long term (current) use of anticoagulants: Secondary | ICD-10-CM

## 2016-02-09 DIAGNOSIS — Z9981 Dependence on supplemental oxygen: Secondary | ICD-10-CM

## 2016-02-09 DIAGNOSIS — D6832 Hemorrhagic disorder due to extrinsic circulating anticoagulants: Secondary | ICD-10-CM

## 2016-02-09 DIAGNOSIS — I5033 Acute on chronic diastolic (congestive) heart failure: Secondary | ICD-10-CM | POA: Diagnosis present

## 2016-02-09 DIAGNOSIS — D638 Anemia in other chronic diseases classified elsewhere: Secondary | ICD-10-CM | POA: Diagnosis present

## 2016-02-09 DIAGNOSIS — Z794 Long term (current) use of insulin: Secondary | ICD-10-CM

## 2016-02-09 DIAGNOSIS — E1122 Type 2 diabetes mellitus with diabetic chronic kidney disease: Secondary | ICD-10-CM | POA: Diagnosis present

## 2016-02-09 DIAGNOSIS — Z833 Family history of diabetes mellitus: Secondary | ICD-10-CM

## 2016-02-09 HISTORY — DX: Failed or difficult intubation, initial encounter: T88.4XXA

## 2016-02-09 HISTORY — DX: Dependence on supplemental oxygen: Z99.81

## 2016-02-09 LAB — COMPREHENSIVE METABOLIC PANEL
ALK PHOS: 33 U/L — AB (ref 38–126)
ALT: 19 U/L (ref 17–63)
ANION GAP: 9 (ref 5–15)
AST: 33 U/L (ref 15–41)
Albumin: 3.5 g/dL (ref 3.5–5.0)
BUN: 98 mg/dL — ABNORMAL HIGH (ref 6–20)
CALCIUM: 8.7 mg/dL — AB (ref 8.9–10.3)
CO2: 22 mmol/L (ref 22–32)
Chloride: 102 mmol/L (ref 101–111)
Creatinine, Ser: 3.23 mg/dL — ABNORMAL HIGH (ref 0.61–1.24)
GFR calc non Af Amer: 17 mL/min — ABNORMAL LOW (ref >60)
GFR, EST AFRICAN AMERICAN: 19 mL/min — AB (ref >60)
Glucose, Bld: 127 mg/dL — ABNORMAL HIGH (ref 65–99)
POTASSIUM: 4.9 mmol/L (ref 3.5–5.1)
Sodium: 133 mmol/L — ABNORMAL LOW (ref 135–145)
TOTAL PROTEIN: 7 g/dL (ref 6.5–8.1)
Total Bilirubin: 0.1 mg/dL — ABNORMAL LOW (ref 0.3–1.2)

## 2016-02-09 LAB — PROTIME-INR
INR: 3.11
Prothrombin Time: 32.7 seconds — ABNORMAL HIGH (ref 11.4–15.2)

## 2016-02-09 LAB — CBC WITH DIFFERENTIAL/PLATELET
BASOS ABS: 0 10*3/uL (ref 0.0–0.1)
BASOS PCT: 0 %
EOS ABS: 0.2 10*3/uL (ref 0.0–0.7)
EOS PCT: 3 %
HCT: 24.1 % — ABNORMAL LOW (ref 39.0–52.0)
Hemoglobin: 7.6 g/dL — ABNORMAL LOW (ref 13.0–17.0)
Lymphocytes Relative: 14 %
Lymphs Abs: 0.9 10*3/uL (ref 0.7–4.0)
MCH: 29.3 pg (ref 26.0–34.0)
MCHC: 31.5 g/dL (ref 30.0–36.0)
MCV: 93.1 fL (ref 78.0–100.0)
Monocytes Absolute: 1.1 10*3/uL — ABNORMAL HIGH (ref 0.1–1.0)
Monocytes Relative: 17 %
Neutro Abs: 4.5 10*3/uL (ref 1.7–7.7)
Neutrophils Relative %: 66 %
Platelets: 269 10*3/uL (ref 150–400)
RBC: 2.59 MIL/uL — AB (ref 4.22–5.81)
RDW: 18.3 % — ABNORMAL HIGH (ref 11.5–15.5)
WBC: 6.8 10*3/uL (ref 4.0–10.5)

## 2016-02-09 LAB — URINALYSIS, ROUTINE W REFLEX MICROSCOPIC
Bilirubin Urine: NEGATIVE
Glucose, UA: NEGATIVE mg/dL
Hgb urine dipstick: NEGATIVE
Ketones, ur: NEGATIVE mg/dL
LEUKOCYTES UA: NEGATIVE
NITRITE: NEGATIVE
PH: 5 (ref 5.0–8.0)
PROTEIN: NEGATIVE mg/dL
Specific Gravity, Urine: 1.012 (ref 1.005–1.030)

## 2016-02-09 LAB — HEMOGLOBIN AND HEMATOCRIT, BLOOD
HCT: 24.9 % — ABNORMAL LOW (ref 39.0–52.0)
Hemoglobin: 8 g/dL — ABNORMAL LOW (ref 13.0–17.0)

## 2016-02-09 LAB — I-STAT CG4 LACTIC ACID, ED: LACTIC ACID, VENOUS: 1.04 mmol/L (ref 0.5–1.9)

## 2016-02-09 LAB — OCCULT BLOOD X 1 CARD TO LAB, STOOL: Fecal Occult Bld: POSITIVE — AB

## 2016-02-09 LAB — LIPASE, BLOOD: Lipase: 30 U/L (ref 11–51)

## 2016-02-09 MED ORDER — DIGOXIN 125 MCG PO TABS: 125.0000 ug | ORAL_TABLET | Freq: Every day | ORAL | Status: DC

## 2016-02-09 MED ORDER — ISOSORBIDE MONONITRATE ER 30 MG PO TB24
30.0000 mg | ORAL_TABLET | Freq: Every day | ORAL | Status: DC
  Administered 2016-02-10: 30 mg via ORAL
  Filled 2016-02-09: qty 1

## 2016-02-09 MED ORDER — SODIUM CHLORIDE 0.9 % IV BOLUS (SEPSIS)
1000.0000 mL | Freq: Once | INTRAVENOUS | Status: AC
  Administered 2016-02-09: 1000 mL via INTRAVENOUS

## 2016-02-09 MED ORDER — LORATADINE 10 MG PO TABS
10.0000 mg | ORAL_TABLET | Freq: Every day | ORAL | Status: DC
  Administered 2016-02-10 – 2016-02-13 (×4): 10 mg via ORAL
  Filled 2016-02-09 (×4): qty 1

## 2016-02-09 MED ORDER — ALBUTEROL SULFATE (2.5 MG/3ML) 0.083% IN NEBU: 2.5000 mg | INHALATION_SOLUTION | RESPIRATORY_TRACT | Status: DC | PRN

## 2016-02-09 MED ORDER — ACETAMINOPHEN 325 MG PO TABS: 650.0000 mg | ORAL_TABLET | Freq: Four times a day (QID) | ORAL | Status: DC | PRN

## 2016-02-09 MED ORDER — INSULIN ASPART 100 UNIT/ML ~~LOC~~ SOLN
0.0000 [IU] | Freq: Every day | SUBCUTANEOUS | Status: DC
Start: 2016-02-09 — End: 2016-02-13

## 2016-02-09 MED ORDER — SODIUM CHLORIDE 0.9 % IV SOLN: Freq: Once | INTRAVENOUS | Status: DC

## 2016-02-09 MED ORDER — SODIUM CHLORIDE 0.9 % IV SOLN
INTRAVENOUS | Status: AC
  Administered 2016-02-09: 23:00:00 via INTRAVENOUS

## 2016-02-09 MED ORDER — ONDANSETRON HCL 4 MG/2ML IJ SOLN: 4.0000 mg | Freq: Four times a day (QID) | INTRAMUSCULAR | Status: DC | PRN

## 2016-02-09 MED ORDER — ATORVASTATIN CALCIUM 80 MG PO TABS
80.0000 mg | ORAL_TABLET | Freq: Every day | ORAL | Status: DC
  Administered 2016-02-09 – 2016-02-12 (×4): 80 mg via ORAL
  Filled 2016-02-09 (×4): qty 1

## 2016-02-09 MED ORDER — ENSURE ENLIVE PO LIQD: 237.0000 mL | Freq: Two times a day (BID) | ORAL | Status: DC

## 2016-02-09 MED ORDER — AMIODARONE HCL 200 MG PO TABS
200.0000 mg | ORAL_TABLET | Freq: Every day | ORAL | Status: DC
  Administered 2016-02-10 – 2016-02-13 (×4): 200 mg via ORAL
  Filled 2016-02-09 (×4): qty 1

## 2016-02-09 MED ORDER — BOOST / RESOURCE BREEZE PO LIQD: 1.0000 | Freq: Three times a day (TID) | ORAL | Status: DC

## 2016-02-09 MED ORDER — PANTOPRAZOLE SODIUM 40 MG IV SOLR: 40.0000 mg | Freq: Two times a day (BID) | INTRAVENOUS | Status: DC

## 2016-02-09 MED ORDER — SODIUM CHLORIDE 0.9 % IV SOLN: 10.0000 mL/h | Freq: Once | INTRAVENOUS | Status: DC

## 2016-02-09 MED ORDER — ONDANSETRON HCL 4 MG PO TABS: 4.0000 mg | ORAL_TABLET | Freq: Four times a day (QID) | ORAL | Status: DC | PRN

## 2016-02-09 MED ORDER — DIPHENHYDRAMINE-APAP (SLEEP) 25-500 MG PO TABS: 2.0000 | ORAL_TABLET | Freq: Every evening | ORAL | Status: DC | PRN

## 2016-02-09 MED ORDER — BUDESONIDE 0.5 MG/2ML IN SUSP: 0.5000 mg | Freq: Every day | RESPIRATORY_TRACT | Status: DC

## 2016-02-09 MED ORDER — DIPHENHYDRAMINE HCL 25 MG PO CAPS: 25.0000 mg | ORAL_CAPSULE | Freq: Every evening | ORAL | Status: DC | PRN

## 2016-02-09 MED ORDER — IPRATROPIUM-ALBUTEROL 0.5-2.5 (3) MG/3ML IN SOLN
3.0000 mL | Freq: Four times a day (QID) | RESPIRATORY_TRACT | Status: DC | PRN
Start: 2016-02-09 — End: 2016-02-13

## 2016-02-09 MED ORDER — FENOFIBRATE 54 MG PO TABS
54.0000 mg | ORAL_TABLET | Freq: Every day | ORAL | Status: DC
  Administered 2016-02-10 – 2016-02-13 (×4): 54 mg via ORAL
  Filled 2016-02-09 (×4): qty 1

## 2016-02-09 MED ORDER — TRAZODONE HCL 100 MG PO TABS
100.0000 mg | ORAL_TABLET | Freq: Every day | ORAL | Status: DC
  Administered 2016-02-09 – 2016-02-12 (×4): 100 mg via ORAL
  Filled 2016-02-09 (×4): qty 1

## 2016-02-09 MED ORDER — LEVOCETIRIZINE DIHYDROCHLORIDE 5 MG PO TABS: 2.5000 mg | ORAL_TABLET | Freq: Every evening | ORAL | Status: DC

## 2016-02-09 MED ORDER — ACETAMINOPHEN 650 MG RE SUPP: 650.0000 mg | Freq: Four times a day (QID) | RECTAL | Status: DC | PRN

## 2016-02-09 MED ORDER — SODIUM CHLORIDE 0.9 % IV SOLN
8.0000 mg/h | INTRAVENOUS | Status: DC
  Administered 2016-02-09: 8 mg/h via INTRAVENOUS
  Filled 2016-02-09 (×4): qty 80

## 2016-02-09 MED ORDER — INSULIN ASPART 100 UNIT/ML ~~LOC~~ SOLN: 0.0000 [IU] | Freq: Three times a day (TID) | SUBCUTANEOUS | Status: DC

## 2016-02-09 MED ORDER — PANTOPRAZOLE SODIUM 40 MG IV SOLR
80.0000 mg | Freq: Once | INTRAVENOUS | Status: AC
  Administered 2016-02-09: 23:00:00 80 mg via INTRAVENOUS
  Filled 2016-02-09: qty 80

## 2016-02-09 NOTE — Progress Notes (Signed)
Dr. Hilbert Bible notified of Mr. Barbosa arrival to [id number] via text page and that orders are needed.

## 2016-02-09 NOTE — ED Triage Notes (Signed)
C/o fatigue with falls x 3 days-vomited x 1 diarrhea x 1 today-presents to triage in w/c with home O2-NAD

## 2016-02-09 NOTE — Progress Notes (Signed)
Still waiting for MD orders.Called [employer] [employer] spoke to Laqueta Carina MD orders.

## 2016-02-09 NOTE — ED Provider Notes (Addendum)
[facility] DEPT [facility] Provider Note   CSN: [id number] Arrival date & time: 02/09/16  1305     History   Chief Complaint Chief Complaint  Patient presents with  . Fatigue    HPI Mark Rivers is a 82 y.o. male.  Patient w hx cabg, htn, dm, c/o diarrhea for past, several episodes, watery not bloody, emesis x 1, generally weakness and lightheaded/faintness x 2-3 days.  Patients symptoms acute in onset, persistent, constant, felt worse today. Denies headache. No neck or back pain. +episodic, non prod cough, no sore throat or runny nose. No sob. No chest pain or discomfort. Denies abd pain. No dysuria or gu c/o. No fever or chills. States compliant w home meds, denies change in meds/doses. Denies blood loss, rectal bleeding or melena.    The history is provided by the patient.    Past Medical History:  Diagnosis Date  . Allergy   . Back pain   . Chronic diastolic CHF (congestive heart failure) ([place]) 12/21/2015   A. Echo 10/17: Moderate LVH, EF 55-60, normal wall motion, grade 2 diastolic dysfunction, mild aortic stenosis (mean 14, peak 34), MAC, mild LAE    . CKD (chronic kidney disease) stage 3, GFR 30-59 ml/min 03/04/2014  . Coronary artery disease    a. s/p remote POBA in 1988 // b. NSTEMI in 10/17 >> LHC with 99% LM stenosis >> emergent CABG (L-LAD, S-OM2, S-PDA) - post op course complicated (prolonged ventilation, s/p Trach, AFib, anemia req transfusion, bacteremia >> DC to LTAC)  . Diabetes mellitus   . GERD (gastroesophageal reflux disease)   . History of nuclear stress test    a. Myoview 6/14 - EF 51, no ischemia or scar; Low Risk  . Hyperlipemia 09/11/2013   Last Assessment & Plan:  Lipid abnormalities are improving with treatment. Nutritional counseling was provided. and Pharmacotherapy as ordered. Lipids will be reassessed in 3 months.  . Hypertension   . Neuropathy ([place])   . Persistent atrial fibrillation ([place]) 10/25/2015    Patient Active Problem List   Diagnosis Date Noted  . Monitoring for long-term anticoagulant use 12/24/2015  . Chronic diastolic CHF (congestive heart failure) ([place]) 12/21/2015  . Pressure injury of skin 11/17/2015  . Surgery, elective   . Coronary artery disease involving native coronary artery of native heart without angina pectoris 10/27/2015  . S/P CABG x 3   . History of MI (myocardial infarction)   . Persistent atrial fibrillation ([place]) 10/25/2015  . Seasonal allergic conjunctivitis 08/25/2015  . History of tobacco abuse 08/25/2015  . Diabetes mellitus ([place]) 03/11/2015  . Diabetes mellitus with stage 3 chronic kidney disease ([place]) 03/11/2015  . Metabolic bone disease 82/99/3716  . Vitamin D deficiency 03/11/2015  . Atopic dermatitis 02/24/2015  . Hip pain 09/24/2014  . Need for immunization against influenza 09/24/2014  . Benign essential hypertension 03/13/2014  . Generalized osteoarthritis of multiple sites 03/13/2014  . CKD (chronic kidney disease) stage 3, GFR 30-59 ml/min 03/04/2014  . Acute stress disorder 11/12/2013  . Seasonal and perennial allergic rhinitis 11/12/2013  . Allergic urticaria 11/12/2013  . Benign neoplasm of colon 11/12/2013  . Causalgia of upper extremity 11/12/2013  . Cervical radiculopathy 11/12/2013  . Neck pain 11/12/2013  . Chronic fatigue 11/12/2013  . Hearing loss 11/12/2013  . Insomnia 11/12/2013  . Obesity 11/12/2013  . Premature ventricular contractions 11/12/2013  . Psychosexual dysfunction with inhibited sexual excitement 11/12/2013  . Hyperlipemia 09/11/2013  . Type 2 diabetes mellitus, uncontrolled ([place]) 09/11/2013  .  Pulmonary emphysema ([place]) 04/22/2013  . Esophageal reflux   . Benign hypertension with chronic kidney disease, stage III   . Allergy   . Neuropathy [facility])     Past Surgical History:  Procedure Laterality Date  . CARDIAC CATHETERIZATION N/A 10/26/2015   Procedure: Left Heart Cath and Coronary Angiography;  Surgeon: Troy Sine, MD;  Location:  [facility];  Service: Cardiovascular;  Laterality: N/A;  . cataract surg     bil/2008  . COLONOSCOPY    . CORONARY ARTERY BYPASS GRAFT N/A 10/26/2015   Procedure: CORONARY ARTERY BYPASS GRAFTING (CABG) x3(LIMA to LA, SVG to OM1, SVG to PDA) with EVH from the left thigh and partial lower leg greater saphenous vein and left internal mammary artery;  Surgeon: Ivin Poot, MD;  Location: [facility];  Service: Open Heart Surgery;  Laterality: N/A;  . CORONARY STENT PLACEMENT     1988  . TEE WITHOUT CARDIOVERSION N/A 10/26/2015   Procedure: TRANSESOPHAGEAL ECHOCARDIOGRAM (TEE);  Surgeon: Ivin Poot, MD;  Location: [facility];  Service: Open Heart Surgery;  Laterality: N/A;  . TONSILLECTOMY         Home Medications    Prior to Admission medications   Medication Sig Start Date End Date Taking? Authorizing Provider  amiodarone (PACERONE) 200 MG tablet Take 1 tablet (200 mg total) by mouth as directed. 1 tab twice daily for 2 weeks; then decrease to 1 tab once a day 01/17/16   Liliane Shi, PA-C  amLODipine (NORVASC) 10 MG tablet Take 0.5 tablets (5 mg total) by mouth daily. 01/12/16   Cheryln Manly, NP  aspirin EC 325 MG tablet Take 325 mg by mouth daily.    Historical Provider, MD  atorvastatin (LIPITOR) 80 MG tablet Take 1 tablet (80 mg total) by mouth daily. 12/21/15 03/20/16  Liliane Shi, PA-C  budesonide (PULMICORT) 0.5 MG/2ML nebulizer solution Take 0.5 mg by nebulization daily.  12/20/15 12/19/16  Historical Provider, MD  carvedilol (COREG) 3.125 MG tablet Take 2 tablets (6.25 mg total) by mouth 2 (two) times daily. 01/12/16 04/11/16  Cheryln Manly, NP  cholecalciferol (VITAMIN D) 1000 units tablet Take 1,000 Units by mouth daily.    Historical Provider, MD  diphenhydramine-acetaminophen (TYLENOL PM) 25-500 MG TABS tablet Take 2 tablets by mouth at bedtime as needed (for sleep).     Historical Provider, MD  fenofibrate 54 MG tablet Take 54 mg by mouth daily.    Historical  Provider, MD  furosemide (LASIX) 40 MG tablet Take 0.5 tablets (20 mg total) by mouth daily. 01/18/16 04/17/16  Liliane Shi, PA-C  Glucosamine-Chondroit-Vit C-Mn (GLUCOSAMINE CHONDR 1500 COMPLX PO) Take 2 tablets by mouth daily.     Historical Provider, MD  hydrALAZINE (APRESOLINE) 50 MG tablet Take 75 mg by mouth 3 (three) times daily.    Historical Provider, MD  isosorbide mononitrate (IMDUR) 30 MG 24 hr tablet Take 1 tablet (30 mg total) by mouth daily. 10/20/15 01/18/16  Isaiah Serge, NP  levocetirizine (XYZAL) 5 MG tablet Take 0.5 tablets (2.5 mg total) by mouth every evening. 08/25/15   Adelina Mings, MD  losartan (COZAAR) 100 MG tablet Take 1 tablet (100 mg total) by mouth daily. 12/24/14   Josue Hector, MD  niacin (NIASPAN) 1000 MG CR tablet Take 1,000 mg by mouth at bedtime.    Historical Provider, MD  TOUJEO SOLOSTAR 300 UNIT/ML SOPN Inject 30 Units into the skin at bedtime.  08/15/14  Historical Provider, MD  traZODone (DESYREL) 50 MG tablet Take 100 mg by mouth at bedtime.     Historical Provider, MD  warfarin (COUMADIN) 1 MG tablet Take 4 tablets (4 mg total) by mouth daily at 6 PM. 01/12/16   Cheryln Manly, NP    Family History Family History  Problem Relation Age of Onset  . Diabetes Father   . Colon cancer Neg Hx   . Esophageal cancer Neg Hx   . Stomach cancer Neg Hx   . Rectal cancer Neg Hx   . Allergic rhinitis Neg Hx   . Angioedema Neg Hx   . Asthma Neg Hx   . Eczema Neg Hx   . Immunodeficiency Neg Hx   . Urticaria Neg Hx     Social History Social History  Substance Use Topics  . Smoking status: Former Smoker    Packs/day: 2.00    Years: 30.00    Types: Cigarettes    Quit date: 06/28/2004  . Smokeless tobacco: Never Used  . Alcohol use No     Comment: QUIT 06/28/2004     Allergies   Amoxicillin; Cephalexin; and Penicillin g   Review of Systems Review of Systems  Constitutional: Negative for chills and fever.  HENT: Negative for sore throat.     Eyes: Negative for redness.  Respiratory: Positive for cough. Negative for shortness of breath.   Cardiovascular: Negative for chest pain and leg swelling.  Gastrointestinal: Positive for diarrhea and vomiting. Negative for abdominal pain and blood in stool.  Genitourinary: Negative for dysuria and flank pain.  Musculoskeletal: Negative for back pain and neck pain.  Skin: Negative for rash.  Neurological: Negative for headaches.  Hematological: Does not bruise/bleed easily.  Psychiatric/Behavioral: Negative for confusion.     Physical Exam Updated Vital Signs BP (!) 81/46 (BP Location: Right Arm)   Pulse 92   Temp 97.9 F (36.6 C) (Oral)   Resp 20   Ht _0  (1.702 m)   Wt 84.4 kg   SpO2 96%   BMI 29.13 kg/m   Physical Exam  Constitutional: He appears well-developed and well-nourished.  Weak appearing, hypotensive.   HENT:  Head: Atraumatic.  Mouth/Throat: Oropharynx is clear and moist.  Eyes: Conjunctivae are normal. Pupils are equal, round, and reactive to light.  Neck: Normal range of motion. Neck supple. No tracheal deviation present. No thyromegaly present.  No bruits. No rigidity  Cardiovascular: Normal rate, regular rhythm, normal heart sounds and intact distal pulses.  Exam reveals no gallop and no friction rub.   No murmur heard. Pulmonary/Chest: Effort normal and breath sounds normal. No accessory muscle usage. No respiratory distress.  Abdominal: Soft. Bowel sounds are normal. He exhibits no distension. There is no tenderness.  No puls mass.   Genitourinary:  Genitourinary Comments: No cva tenderness. On rectal exam, brown stool, heme pos.   Musculoskeletal: He exhibits no edema or tenderness.  Neurological: He is alert.  Speech clear/fluent. Motor intact bil.   Skin: Skin is warm and dry.  Psychiatric: He has a normal mood and affect.  Nursing note and vitals reviewed.    ED Treatments / Results  Labs (all labs ordered are listed, but only abnormal  results are displayed) Results for orders placed or performed during the hospital encounter of 02/09/16  Lipase, blood  Result Value Ref Range   Lipase 30 11 - 51 U/L  Comprehensive metabolic panel  Result Value Ref Range   Sodium 133 (L) 135 - 145  mmol/L   Potassium 4.9 3.5 - 5.1 mmol/L   Chloride 102 101 - 111 mmol/L   CO2 22 22 - 32 mmol/L   Glucose, Bld 127 (H) 65 - 99 mg/dL   BUN 98 (H) 6 - 20 mg/dL   Creatinine, Ser 3.23 (H) 0.61 - 1.24 mg/dL   Calcium 8.7 (L) 8.9 - 10.3 mg/dL   Total Protein 7.0 6.5 - 8.1 g/dL   Albumin 3.5 3.5 - 5.0 g/dL   AST 33 15 - 41 U/L   ALT 19 17 - 63 U/L   Alkaline Phosphatase 33 (L) 38 - 126 U/L   Total Bilirubin 0.1 (L) 0.3 - 1.2 mg/dL   GFR calc non Af Amer 17 (L) >60 mL/min   GFR calc Af Amer 19 (L) >60 mL/min   Anion gap 9 5 - 15  Protime-INR  Result Value Ref Range   Prothrombin Time 32.7 (H) 11.4 - 15.2 seconds   INR 3.11   CBC with Differential  Result Value Ref Range   WBC 6.8 4.0 - 10.5 K/uL   RBC 2.59 (L) 4.22 - 5.81 MIL/uL   Hemoglobin 7.6 (L) 13.0 - 17.0 g/dL   HCT 24.1 (L) 39.0 - 52.0 %   MCV 93.1 78.0 - 100.0 fL   MCH 29.3 26.0 - 34.0 pg   MCHC 31.5 30.0 - 36.0 g/dL   RDW 18.3 (H) 11.5 - 15.5 %   Platelets 269 150 - 400 K/uL   Neutrophils Relative % 66 %   Neutro Abs 4.5 1.7 - 7.7 K/uL   Lymphocytes Relative 14 %   Lymphs Abs 0.9 0.7 - 4.0 K/uL   Monocytes Relative 17 %   Monocytes Absolute 1.1 (H) 0.1 - 1.0 K/uL   Eosinophils Relative 3 %   Eosinophils Absolute 0.2 0.0 - 0.7 K/uL   Basophils Relative 0 %   Basophils Absolute 0.0 0.0 - 0.1 K/uL  Occult blood card to lab, stool  Result Value Ref Range   Fecal Occult Bld POSITIVE (A) NEGATIVE  I-Stat CG4 Lactic Acid, ED  Result Value Ref Range   Lactic Acid, Venous 1.04 0.5 - 1.9 mmol/L   Dg Chest 2 View  Result Date: 02/09/2016 CLINICAL DATA:  Cough.  Fatigue and falls. EXAM: CHEST  2 VIEW COMPARISON:  01/09/2016 FINDINGS: Stable postsurgical changes from CABG.  Cardiomediastinal silhouette is enlarged. Mediastinal contours appear intact. There is no evidence of focal airspace consolidation, pleural effusion or pneumothorax. Mild pulmonary vascular congestion. Osseous structures are without acute abnormality. Soft tissues are grossly normal. IMPRESSION: Enlarged cardiac silhouette with mild pulmonary vascular congestion. Electronically Signed   By: Fidela Salisbury M.D.   On: 02/09/2016 14:09    EKG  EKG Interpretation  Date/Time:  Wednesday February 09 2016 14:05:16 EST Ventricular Rate:  89 PR Interval:    QRS Duration: 104 QT Interval:  387 QTC Calculation: 471 R Axis:   93 Text Interpretation:  Atrial fibrillation Right axis deviation Borderline T wave abnormalities Confirmed by Ashok Cordia  MD, Lennette Bihari ([id number]) on 02/09/2016 2:12:11 PM       Radiology No results found.  Procedures Procedures (including critical care time)  Medications Ordered in ED Medications  sodium chloride 0.9 % bolus 1,000 mL (1,000 mLs Intravenous New Bag/Given 02/09/16 1402)  sodium chloride 0.9 % bolus 1,000 mL (not administered)     Initial Impression / Assessment and Plan / ED Course  I have reviewed the triage vital signs and the nursing notes.  Pertinent labs &  imaging results that were available during my care of the patient were reviewed by me and considered in my medical decision making (see chart for details).  Reviewed nursing notes and prior charts for additional history.   Iv ns bolus.   Labs.   On review prior charts, patient had 2 office visits this month - at time cardiology visit, bp 80/50, and then later in month was 100/60.   Hgb decreased from prior, significant anemia.  Brown stool, but heme pos. Anticoag on couamdin.   Iv ns bolus.  Also with AKI on labs.   Suspect low bp related to volume depletion, symptomatic anemia, gi bl loss.  No indication of sepsis. No fever or chills, wbc normal, lactate normal, no source of bacterial inf  noted.   Pt requests admit preference for [facility].  Unassigned medicine consulted for admission.    Will begin unit prbc given low bp, anemia.   CRITICAL CARE:  RE hypotension, severe anemia, aki, weakness, diarrhea, heme pos stool,  Performed by: Mirna Mires Total critical care time: 40 minutes Critical care time was exclusive of separately billable procedures and treating other patients. Critical care was necessary to treat or prevent imminent or life-threatening deterioration. Critical care was time spent personally by me on the following activities: development of treatment plan with patient and/or surrogate as well as nursing, discussions with consultants, evaluation of patient's response to treatment, examination of patient, obtaining history from patient or surrogate, ordering and performing treatments and interventions, ordering and review of laboratory studies, ordering and review of radiographic studies, pulse oximetry and re-evaluation of patient's condition.  Discussed with Dr Saralyn Pilar at [facility], he accepts in transfer to monitored bed, admit.    Final Clinical Impressions(s) / ED Diagnoses   Final diagnoses:  None    New Prescriptions New Prescriptions   No medications on file         Lajean Saver, MD 02/09/16 1544

## 2016-02-09 NOTE — H&P (Signed)
History and Physical    Mark Rivers GYB:[id number] DOB: 1934/07/01 DOA: 02/09/2016  Referring MD/NP/PA: Dr. Linna Darner PCP: [name]., MD  Patient coming from: Home  Chief Complaint: Generalized weakness  HPI: Mark Rivers is a 82 y.o. male with medical history significant of HTN, CAD, chronic respiratory failure diastolic CHF last EF 87-68% 10/2015, COPD, oxygen independent 2 L [place] O2, afib on Coumadin, DM type II, and CKD; who presents with complaints of generalized weakness over the last 2-3 days. Patient notes that he fell down while at home a couple times. His brother had to help him get back up. Patient denies any loss of consciousness or trauma to his head. Patient reports that he was not able to use his arms or legs to get himself up due to weakness and fatigue.  Associated symptoms include intermittent nonproductive cough, lack of appetite, decreased po intake, and loose stools. Denies any headache, muscle aches, nausea, vomiting, dysuria, worsening shortness of breath, or leg swelling. Symptoms progressively worsened to the point when she was unable to get out of bed today. He was unable to make it to the bathroom in time and had a watery bowel movement on himself. Also, reports acute onset of achy epigastric pain. Denies NSAID use or visible blood in stools. On Coumadin for atrial fibrillation and the patient notes that they've had to adjust his medication doses due to elevated INRs as high as 3.7 over the last 2-3 weeks. Review of records shows patient had a hemoglobin 9.4 with a creatinine of 1.23 -1.87 just 3 weeks ago.      ED Course: Upon admission into the emergency department seen to be afebrile, pulse 72-100, respirations 11-22, blood pressure as low as 75/52, and O2 saturations maintained on home 2 L. Lab revealed hemoglobin 7.6, sodium 133, BUN 98, and creatinine 3.23.  Patient was given 1 unit of packed red blood cells while at the [facility] ED due to hypotension and 1  L normal saline IV fluids with improvement of blood pressures.   Review of Systems: As per HPI otherwise 10 point review of systems negative.   Past Medical History:  Diagnosis Date  . Allergy   . Back pain   . Chronic diastolic CHF (congestive heart failure) ([place]) 12/21/2015   A. Echo 10/17: Moderate LVH, EF 55-60, normal wall motion, grade 2 diastolic dysfunction, mild aortic stenosis (mean 14, peak 34), MAC, mild LAE    . CKD (chronic kidney disease) stage 3, GFR 30-59 ml/min 03/04/2014  . Coronary artery disease    a. s/p remote POBA in 1988 // b. NSTEMI in 10/17 >> LHC with 99% LM stenosis >> emergent CABG (L-LAD, S-OM2, S-PDA) - post op course complicated (prolonged ventilation, s/p Trach, AFib, anemia req transfusion, bacteremia >> DC to LTAC)  . Diabetes mellitus   . GERD (gastroesophageal reflux disease)   . History of nuclear stress test    a. Myoview 6/14 - EF 51, no ischemia or scar; Low Risk  . Hyperlipemia 09/11/2013   Last Assessment & Plan:  Lipid abnormalities are improving with treatment. Nutritional counseling was provided. and Pharmacotherapy as ordered. Lipids will be reassessed in 3 months.  . Hypertension   . Neuropathy ([place])   . Persistent atrial fibrillation ([place]) 10/25/2015    Past Surgical History:  Procedure Laterality Date  . CARDIAC CATHETERIZATION N/A 10/26/2015   Procedure: Left Heart Cath and Coronary Angiography;  Surgeon: Troy Sine, MD;  Location: [place] [facility];  Service:  Cardiovascular;  Laterality: N/A;  . cataract surg     bil/2008  . COLONOSCOPY    . CORONARY ARTERY BYPASS GRAFT N/A 10/26/2015   Procedure: CORONARY ARTERY BYPASS GRAFTING (CABG) x3(LIMA to LA, SVG to OM1, SVG to PDA) with EVH from the left thigh and partial lower leg greater saphenous vein and left internal mammary artery;  Surgeon: Ivin Poot, MD;  Location: [facility];  Service: Open Heart Surgery;  Laterality: N/A;  . CORONARY STENT PLACEMENT     1988  . TEE WITHOUT  CARDIOVERSION N/A 10/26/2015   Procedure: TRANSESOPHAGEAL ECHOCARDIOGRAM (TEE);  Surgeon: Ivin Poot, MD;  Location: [place];  Service: Open Heart Surgery;  Laterality: N/A;  . TONSILLECTOMY       reports that he quit smoking about 11 years ago. His smoking use included Cigarettes. He has a 60.00 pack-year smoking history. He has never used smokeless tobacco. He reports that he does not drink alcohol or use drugs.  Allergies  Allergen Reactions  . Amoxicillin Other (See Comments)    Other reaction(s): Other (See Comments) PATIENT PASSES OUT!!!! Passes out PATIENT PASSES OUT!!!!  . Cephalexin Other (See Comments)    PATIENT PASSES OUT!!!! Passes out PATIENT PASSES OUT!!!!  . Penicillin G Other (See Comments)    Has patient had a PCN reaction causing immediate rash, facial/tongue/throat swelling, SOB or lightheadedness with hypotension: Yes Has patient had a PCN reaction causing severe rash involving mucus membranes or skin necrosis: No Has patient had a PCN reaction that required hospitalization: Yes Has patient had a PCN reaction occurring within the last 10 years: Yes If all of the above answers are "NO", then may proceed with Cephalosporin use. PATIENT PASSES OUT!!!! Has patient had a PCN reaction causing immediate rash, facial/tongue/throat swelling, SOB or lightheadedness with hypotension: Yes Has patient had a PCN reaction causing severe rash involving mucus membranes or skin necrosis: No Has patient had a PCN reaction that required hospitalization: Yes Has patient had a PCN reaction occurring within the last 10 years: Yes If all of the above answers are "NO", then may proceed with Cephalosporin use. PATIENT PASSES OUT!!!!    Family History  Problem Relation Age of Onset  . Diabetes Father   . Colon cancer Neg Hx   . Esophageal cancer Neg Hx   . Stomach cancer Neg Hx   . Rectal cancer Neg Hx   . Allergic rhinitis Neg Hx   . Angioedema Neg Hx   . Asthma Neg Hx   .  Eczema Neg Hx   . Immunodeficiency Neg Hx   . Urticaria Neg Hx     Prior to Admission medications   Medication Sig Start Date End Date Taking? Authorizing Provider  amiodarone (PACERONE) 200 MG tablet Take 1 tablet (200 mg total) by mouth as directed. 1 tab twice daily for 2 weeks; then decrease to 1 tab once a day Patient taking differently: Take 200 mg by mouth daily.  01/17/16  Yes Scott T Kathlen Mody, PA-C  amLODipine (NORVASC) 10 MG tablet Take 0.5 tablets (5 mg total) by mouth daily. 01/12/16  Yes Cheryln Manly, NP  aspirin EC 81 MG tablet Take 81 mg by mouth daily.    Yes Historical Provider, MD  atorvastatin (LIPITOR) 80 MG tablet Take 1 tablet (80 mg total) by mouth daily. 12/21/15 03/20/16 Yes Scott T Weaver, PA-C  carvedilol (COREG) 3.125 MG tablet Take 2 tablets (6.25 mg total) by mouth 2 (two) times daily. Patient taking differently:  Take 3.125 mg by mouth 2 (two) times daily.  01/12/16 04/11/16 Yes Cheryln Manly, NP  cholecalciferol (VITAMIN D) 1000 units tablet Take 1,000 Units by mouth daily.   Yes Historical Provider, MD  digoxin (LANOXIN) 0.125 MG tablet Take 125 mcg by mouth daily. 12/15/15  Yes Historical Provider, MD  diltiazem (CARDIZEM) 30 MG tablet Take 30 mg by mouth 2 (two) times daily. 12/15/15  Yes Historical Provider, MD  diphenhydramine-acetaminophen (TYLENOL PM) 25-500 MG TABS tablet Take 2 tablets by mouth at bedtime as needed (for sleep).    Yes Historical Provider, MD  fenofibrate 54 MG tablet Take 54 mg by mouth daily.   Yes Historical Provider, MD  furosemide (LASIX) 40 MG tablet Take 0.5 tablets (20 mg total) by mouth daily. Patient taking differently: Take 40 mg by mouth daily.  01/18/16 04/17/16 Yes Scott T Weaver, PA-C  Glucosamine-Chondroit-Vit C-Mn (GLUCOSAMINE CHONDR 1500 COMPLX PO) Take 2 tablets by mouth daily.    Yes Historical Provider, MD  hydrALAZINE (APRESOLINE) 50 MG tablet Take 75 mg by mouth 3 (three) times daily.   Yes Historical Provider, MD    isosorbide mononitrate (IMDUR) 30 MG 24 hr tablet Take 1 tablet (30 mg total) by mouth daily. 10/20/15 02/09/16 Yes Isaiah Serge, NP  levocetirizine (XYZAL) 5 MG tablet Take 0.5 tablets (2.5 mg total) by mouth every evening. 08/25/15  Yes Adelina Mings, MD  losartan (COZAAR) 100 MG tablet Take 1 tablet (100 mg total) by mouth daily. 12/24/14  Yes Josue Hector, MD  metoprolol tartrate (LOPRESSOR) 25 MG tablet Take 25 mg by mouth 2 (two) times daily. 12/16/15  Yes Historical Provider, MD  TOUJEO SOLOSTAR 300 UNIT/ML SOPN Inject 30 Units into the skin every morning.  08/15/14  Yes Historical Provider, MD  traZODone (DESYREL) 100 MG tablet Take 100 mg by mouth at bedtime.    Yes Historical Provider, MD  warfarin (COUMADIN) 1 MG tablet Take 4 tablets (4 mg total) by mouth daily at 6 PM. Patient taking differently: Take 2-3 mg by mouth See admin instructions. Take 3 mg by mouth on Sunday and Thursday. Take 2 mg by mouth on all other days 01/12/16  Yes Cheryln Manly, NP  budesonide (PULMICORT) 0.5 MG/2ML nebulizer solution Take 0.5 mg by nebulization daily.  12/20/15 12/19/16  Historical Provider, MD    Physical Exam:    Constitutional: Elderly male who appears sick, but nontoxic. Vitals:   02/09/16 1715 02/09/16 1745 02/09/16 1759 02/09/16 1856  BP: 94/59 95/56 103/60 112/71  Pulse: 72 100  86  Resp: 15 16  18   Temp:   98 F (36.7 C) 98 F (36.7 C)  TempSrc:   Oral Oral  SpO2: 97% 96%  96%  Weight:    86.5 kg (190 lb 11.2 oz)  Height:    5' 7"  (1.702 m)   Eyes: PERRL, lids and conjunctivae normal ENMT: Mucous membranes are moist. Posterior pharynx clear of any exudate or lesions.  Neck: normal, supple, no masses, no thyromegaly Respiratory: clear to auscultation bilaterally, no wheezing, no crackles. Normal respiratory effort. No accessory muscle use.  Cardiovascular: Irregular irregular. No murmurs / rubs / gallops. No extremity edema. 2+ pedal pulses. No carotid bruits.   Abdomen: no tenderness, no masses palpated. No hepatosplenomegaly. Bowel sounds positive.  Musculoskeletal: no clubbing / cyanosis. No joint deformity upper and lower extremities. Good ROM, no contractures. Normal muscle tone.  Skin:Pallor noted. no rashes, lesions, ulcers. No induration Neurologic: CN 2-12 grossly  intact. Sensation intact, DTR normal. Strength 4+/5 in all 4.  Psychiatric: Normal judgment and insight. Alert and oriented x 3. Normal mood.     Labs on Admission: I have personally reviewed following labs and imaging studies  CBC:  Recent Labs Lab 02/09/16 1341  WBC 6.8  NEUTROABS 4.5  HGB 7.6*  HCT 24.1*  MCV 93.1  PLT 811   Basic Metabolic Panel:  Recent Labs Lab 02/09/16 1341  NA 133*  K 4.9  CL 102  CO2 22  GLUCOSE 127*  BUN 98*  CREATININE 3.23*  CALCIUM 8.7*   GFR: Estimated Creatinine Clearance: 19.5 mL/min (by C-G formula based on SCr of 3.23 mg/dL (H)). Liver Function Tests:  Recent Labs Lab 02/09/16 1341  AST 33  ALT 19  ALKPHOS 33*  BILITOT 0.1*  PROT 7.0  ALBUMIN 3.5    Recent Labs Lab 02/09/16 1341  LIPASE 30   No results for input(s): AMMONIA in the last 168 hours. Coagulation Profile:  Recent Labs Lab 02/04/16 02/09/16 1341  INR 3.5 3.11   Cardiac Enzymes: No results for input(s): CKTOTAL, CKMB, CKMBINDEX, TROPONINI in the last 168 hours. BNP (last 3 results) No results for input(s): PROBNP in the last 8760 hours. HbA1C: No results for input(s): HGBA1C in the last 72 hours. CBG: No results for input(s): GLUCAP in the last 168 hours. Lipid Profile: No results for input(s): CHOL, HDL, LDLCALC, TRIG, CHOLHDL, LDLDIRECT in the last 72 hours. Thyroid Function Tests: No results for input(s): TSH, T4TOTAL, FREET4, T3FREE, THYROIDAB in the last 72 hours. Anemia Panel: No results for input(s): VITAMINB12, FOLATE, FERRITIN, TIBC, IRON, RETICCTPCT in the last 72 hours. Urine analysis:    Component Value Date/Time    COLORURINE YELLOW 02/09/2016 1700   APPEARANCEUR CLEAR 02/09/2016 1700   LABSPEC 1.012 02/09/2016 1700   PHURINE 5.0 02/09/2016 1700   GLUCOSEU NEGATIVE 02/09/2016 1700   HGBUR NEGATIVE 02/09/2016 1700   BILIRUBINUR NEGATIVE 02/09/2016 1700   KETONESUR NEGATIVE 02/09/2016 1700   PROTEINUR NEGATIVE 02/09/2016 1700   UROBILINOGEN 0.2 10/26/2008 1315   NITRITE NEGATIVE 02/09/2016 1700   LEUKOCYTESUR NEGATIVE 02/09/2016 1700   Sepsis Labs: No results found for this or any previous visit (from the past 240 hour(s)).   Radiological Exams on Admission: Dg Chest 2 View  Result Date: 02/09/2016 CLINICAL DATA:  Cough.  Fatigue and falls. EXAM: CHEST  2 VIEW COMPARISON:  01/09/2016 FINDINGS: Stable postsurgical changes from CABG. Cardiomediastinal silhouette is enlarged. Mediastinal contours appear intact. There is no evidence of focal airspace consolidation, pleural effusion or pneumothorax. Mild pulmonary vascular congestion. Osseous structures are without acute abnormality. Soft tissues are grossly normal. IMPRESSION: Enlarged cardiac silhouette with mild pulmonary vascular congestion. Electronically Signed   By: Fidela Salisbury M.D.   On: 02/09/2016 14:09    EKG: Independently reviewed. Atrial fibrillation 89 bpm  Assessment/Plan  GI bleed with symptomatic anemia: Acute. Patient found to be guaiac positive on rectal exam with the initial hemoglobin 7.6 previously 9.4 just 3 weeks ago. Patient with elevated INR and BUN. Question the possibility of upper GI bleed versus possibility of lower given history of diverticulosis. - Admit to a telemetry bed - T&S for possible need of blood products - Protonix gtt protocol initiate - Serial H&H's - Transfuse PRBCs as needed, goal hemoglobin above 8 given cardiac history - Consider need of Lasix be showing signs of fluid overload - Consult GI in a.m.  Acute renal failure on chronic kidney disease stage III: Patient's creatinine  elevated at 3.23  with a BUN of 98 to suggest a prerenal cause of symptoms. - Check renal ultrasound - check FeUr - Hold nephrotoxic agents   Supratherapeutic INR: Acute. Patient's INR elevated at 3.11 on admission. - Hold Coumadin - PT/INR in am  Transient hypotension: Acute. Pressures noted initially as was 75/52 with improvement after initial fluid resuscitation and blood products. - Continue to monitor  Respiratory infection - Check respiratory viral  - mucinex  Atrial fibrillation: Chronic. Patient currently rate controlled at this time - Continue amiodarone, digoxin  Diastolic CHF: On admission patient did not appear overtly fluid overloaded. - Strict I&O - Continue isosorbide mononitrate as tolerated - Consider need of IV diuresis  Essential hypertension - Did not restart amlodipine, metoprolol, diltiazem, lasix, losartan, hydralazine due to hypotension - restart medications as listed above once medically safe to do so  COPD/emphysema - Continue budesonide  - Albuterol nebs prn SOB/Wheezing  Dyslipidemia -  continue fenofibrate   Diabetes mellitus type 2 - Hypoglycemic protocols - Held home Toujeo  - CBG q AC/HS with Sensitive SSI  CAD: s/p PTCA to RCA in 1988 and CABG.   DVT prophylaxis:  SCD Code Status: Full Family Communication: No family present at bedside Disposition Plan: TBD Consults called: none Admission status: Inpatient  Norval Morton MD [facility] Pager [phone]  If 7PM-7AM, please contact night-coverage [email or web address] Password [email or web address]  02/09/2016, 9:06 PM

## 2016-02-09 NOTE — Progress Notes (Signed)
Patient admitted to room 3E29 by [facility]. Transporter stated the unit PRBC was infused at 1833 during transport.  Patient oriented to room and NSL flushed.  Patient placed on droplet precautions per report.  MD will be paged that patient has arrived.

## 2016-02-09 NOTE — Progress Notes (Signed)
   Pt coming from [facility] due to generalized fatigue, hypotension likely from symptomatic anemia from GI blood loss. Pt also noted to have AKI suggestive of dehydration. Pt accepted to Tele bed at [facility]. Last colonoscopy in 2013 w/ diverticulosis w/ [facility].   Linna Darner, MD [facility] 02/09/2016, 3:40 PM

## 2016-02-09 NOTE — Progress Notes (Signed)
Received report from [facility] ED.  Stated patient has unit of PRBC infusing that was hung at 1600 and patient should arrive in approximately 45 minutes.  Labs and history reviewed during report.  Patient assigned to 3E29

## 2016-02-10 ENCOUNTER — Inpatient Hospital Stay (HOSPITAL_COMMUNITY): Payer: Medicare Other

## 2016-02-10 DIAGNOSIS — D649 Anemia, unspecified: Secondary | ICD-10-CM

## 2016-02-10 DIAGNOSIS — N189 Chronic kidney disease, unspecified: Secondary | ICD-10-CM

## 2016-02-10 DIAGNOSIS — R195 Other fecal abnormalities: Secondary | ICD-10-CM

## 2016-02-10 DIAGNOSIS — E440 Moderate protein-calorie malnutrition: Secondary | ICD-10-CM | POA: Insufficient documentation

## 2016-02-10 DIAGNOSIS — N179 Acute kidney failure, unspecified: Secondary | ICD-10-CM | POA: Diagnosis present

## 2016-02-10 DIAGNOSIS — Z7901 Long term (current) use of anticoagulants: Secondary | ICD-10-CM

## 2016-02-10 LAB — DIGOXIN LEVEL: Digoxin Level: <0.2 ng/mL — ABNORMAL LOW (ref 0.8–2.0)

## 2016-02-10 LAB — TYPE AND SCREEN
BLOOD PRODUCT EXPIRATION DATE: 201803012359
Blood Product Expiration Date: 201803012359
ISSUE DATE / TIME: 201801311541
ISSUE DATE / TIME: 201801311554
UNIT TYPE AND RH: 9500
Unit Type and Rh: 9500

## 2016-02-10 LAB — BASIC METABOLIC PANEL
Anion gap: 6 (ref 5–15)
BUN: 71 mg/dL — ABNORMAL HIGH (ref 6–20)
CALCIUM: 8.4 mg/dL — AB (ref 8.9–10.3)
CO2: 22 mmol/L (ref 22–32)
CREATININE: 2.25 mg/dL — AB (ref 0.61–1.24)
Chloride: 110 mmol/L (ref 101–111)
GFR calc non Af Amer: 26 mL/min — ABNORMAL LOW (ref >60)
GFR, EST AFRICAN AMERICAN: 30 mL/min — AB (ref >60)
Glucose, Bld: 158 mg/dL — ABNORMAL HIGH (ref 65–99)
Potassium: 4.2 mmol/L (ref 3.5–5.1)
SODIUM: 138 mmol/L (ref 135–145)

## 2016-02-10 LAB — GLUCOSE, CAPILLARY
GLUCOSE-CAPILLARY: 103 mg/dL — AB (ref 65–99)
GLUCOSE-CAPILLARY: 81 mg/dL (ref 65–99)
Glucose-Capillary: 118 mg/dL — ABNORMAL HIGH (ref 65–99)
Glucose-Capillary: 79 mg/dL (ref 65–99)
Glucose-Capillary: 112 mg/dL — ABNORMAL HIGH (ref 65–99)

## 2016-02-10 LAB — CBC
HEMATOCRIT: 28.5 % — AB (ref 39.0–52.0)
Hemoglobin: 9.1 g/dL — ABNORMAL LOW (ref 13.0–17.0)
MCH: 28.7 pg (ref 26.0–34.0)
MCHC: 31.9 g/dL (ref 30.0–36.0)
MCV: 89.9 fL (ref 78.0–100.0)
PLATELETS: 209 10*3/uL (ref 150–400)
RBC: 3.17 MIL/uL — ABNORMAL LOW (ref 4.22–5.81)
RDW: 17.4 % — AB (ref 11.5–15.5)
WBC: 5.4 10*3/uL (ref 4.0–10.5)

## 2016-02-10 LAB — RESPIRATORY PANEL BY PCR
ADENOVIRUS-RVPPCR: NOT DETECTED
Bordetella pertussis: NOT DETECTED
CORONAVIRUS HKU1-RVPPCR: NOT DETECTED
CORONAVIRUS NL63-RVPPCR: NOT DETECTED
CORONAVIRUS OC43-RVPPCR: NOT DETECTED
Chlamydophila pneumoniae: NOT DETECTED
Coronavirus 229E: NOT DETECTED
INFLUENZA A H3-RVPPCR: DETECTED — AB
Influenza B: NOT DETECTED
MYCOPLASMA PNEUMONIAE-RVPPCR: NOT DETECTED
Metapneumovirus: NOT DETECTED
PARAINFLUENZA VIRUS 1-RVPPCR: NOT DETECTED
PARAINFLUENZA VIRUS 3-RVPPCR: NOT DETECTED
Parainfluenza Virus 2: NOT DETECTED
Parainfluenza Virus 4: NOT DETECTED
Respiratory Syncytial Virus: NOT DETECTED
Rhinovirus / Enterovirus: NOT DETECTED

## 2016-02-10 LAB — PREPARE RBC (CROSSMATCH)

## 2016-02-10 LAB — PROTIME-INR
INR: 3.03
PROTHROMBIN TIME: 32.1 s — AB (ref 11.4–15.2)

## 2016-02-10 LAB — CREATININE, URINE, RANDOM: CREATININE, URINE: 54.95 mg/dL

## 2016-02-10 LAB — APTT: APTT: 58 s — AB (ref 24–36)

## 2016-02-10 MED ORDER — DIGOXIN 125 MCG PO TABS
125.0000 ug | ORAL_TABLET | Freq: Every day | ORAL | Status: DC
  Administered 2016-02-10 – 2016-02-13 (×4): 125 ug via ORAL
  Filled 2016-02-10 (×4): qty 1

## 2016-02-10 MED ORDER — GLUCERNA SHAKE PO LIQD
237.0000 mL | Freq: Two times a day (BID) | ORAL | Status: DC
  Administered 2016-02-12 – 2016-02-13 (×3): 237 mL via ORAL

## 2016-02-10 MED ORDER — SODIUM CHLORIDE 0.9 % IV SOLN: Freq: Once | INTRAVENOUS | Status: DC

## 2016-02-10 MED ORDER — CARVEDILOL 6.25 MG PO TABS
6.2500 mg | ORAL_TABLET | Freq: Two times a day (BID) | ORAL | Status: DC
  Administered 2016-02-10: 6.25 mg via ORAL
  Filled 2016-02-10: qty 1

## 2016-02-10 MED ORDER — VITAMIN K1 10 MG/ML IJ SOLN
5.0000 mg | Freq: Once | INTRAVENOUS | Status: AC
  Administered 2016-02-10: 5 mg via INTRAVENOUS
  Filled 2016-02-10: qty 0.5

## 2016-02-10 MED ORDER — GUAIFENESIN ER 600 MG PO TB12
600.0000 mg | ORAL_TABLET | Freq: Two times a day (BID) | ORAL | Status: DC
Start: 2016-02-10 — End: 2016-02-13
  Administered 2016-02-10 – 2016-02-13 (×7): 600 mg via ORAL
  Filled 2016-02-10 (×7): qty 1

## 2016-02-10 MED ORDER — SODIUM CHLORIDE 0.9 % IV SOLN
INTRAVENOUS | Status: DC
  Administered 2016-02-11: 05:00:00 via INTRAVENOUS

## 2016-02-10 MED ORDER — PANTOPRAZOLE SODIUM 40 MG PO TBEC
40.0000 mg | DELAYED_RELEASE_TABLET | Freq: Every day | ORAL | Status: DC
  Administered 2016-02-10 – 2016-02-13 (×4): 40 mg via ORAL
  Filled 2016-02-10 (×4): qty 1

## 2016-02-10 NOTE — Progress Notes (Signed)
Initial Nutrition Assessment  DOCUMENTATION CODES:   Non-severe (moderate) malnutrition in context of chronic illness  INTERVENTION:  Glucerna BID between meals. Each shake provides 220 calories and 10 grams of protein.  NUTRITION DIAGNOSIS:   Malnutrition related to chronic illness as evidenced by mild depletion of muscle mass, percent weight loss.   GOAL:   Patient will meet greater than or equal to 90% of their needs   MONITOR:   Supplement acceptance, PO intake, I & O's, Labs, Weight trends  REASON FOR ASSESSMENT:   Malnutrition Screening Tool    ASSESSMENT:   82 y.o. male with PMH significant of HTN, CAD, chronic respiratory failure diastolic CHF last EF 0000000 10/2015, COPD, oxygen independent 2 L [place] O2, afib on Coumadin, DM type II, and CKD; who presents with complaints of generalized weakness over the last 2-3 days  Patient reported appetite has been good since admission. Per patient report, he woke up hungry and breakfast intake was 100%. Patient reported no nausea/vomiting or abdominal pain.   Diet PTA was poor. Patient reported no intake of food and only drinking 4 glasses of milk 3-4 days in a row PTA. Before this time frame, he was eating 2 meals at home with a snack in between consisting of apples, cheese, raisins and nuts.  Patient reported his usual body weight was 214 lb and that he currently weighs 188 lb.Per patient chart, a 30 lb. weight loss has occurred since 10/20/15. This is a 13% weight loss over 4 months, which is significant for this time frame.  He explained that his daughter, son and brother had all stayed with him for a period of time during the past few months. Patient became very emotional and explained they thought he was going to die. He reported he did not eat out often, and his relatives did not cook for him. Unsure at this time where meals are prepared.   Patient reported he was not receiving Boost or Ensure and agreed to try a supplement  BID between meals to provide extra protein and calories. Glucerna was recommended.  NFPE found patient was well nourished except for mild muscle depletion in clavicle bone and posterior calf region.  Labs Reviewed: Sodium 133, Glucose 127, BUN 98, Creatinine 3.23  Meds Reviewed: Lipitor, Fenofibrate, Novolog, Vitamin K   Diet Order:  Diet heart healthy/carb modified Room service appropriate? Yes; Fluid consistency: Thin  Skin:  Reviewed, no issues  Last BM:  2/1  Height:   Ht Readings from Last 1 Encounters:  02/09/16 5\' 7"  (1.702 m)    Weight:   Wt Readings from Last 1 Encounters:  02/10/16 188 lb 11.2 oz (85.6 kg)    Ideal Body Weight:  67.27 kg  BMI:  Body mass index is 29.55 kg/m.  Estimated Nutritional Needs:   Kcal:  1800-2000  Protein:  102-112  Fluid:  >/= 1.8 L/day  EDUCATION NEEDS:   No education needs identified at this time  Juliann Pulse M.S. Nutrition Dietetic Intern

## 2016-02-10 NOTE — Progress Notes (Signed)
[facility]  Patient Status: Active (receiving services up to time of hospitalization)  [facility] is providing the following services: RN  If patient discharges after hours, please call [phone].   Mark Rivers 02/10/2016, 8:52 AM

## 2016-02-10 NOTE — Progress Notes (Addendum)
PROGRESS NOTE    Mark Rivers  [id number] DOB: [birth year] DOA: 02/09/2016 PCP: [name]., MD  Brief Narrative:  Tavonte Rivers is a 82 y.o. male with medical history significant of HTN, CAD, chronic respiratory failure diastolic CHF last EF 0000000 10/2015, COPD, oxygen independent 2 L [place] O2, afib on Coumadin, DM type II, and CKD; who presents with complaints of generalized weakness over the last 2-3 days. Found to have drop inHb to 7.6 from 9.5 and hemoccult positive, INR 3.1 Given 1unit PRBC  Assessment & Plan:  GI bleed with symptomatic anemia -guaiac positive on rectal exam with the initial hemoglobin 7.6 previously 9.4 just 3 weeks ago. -s/p 1unit PRBC -monitor Hb, changed PPI to PO -[facility] consulted for possible EGD, Coumadin held, ordered Vitamin k 5mg  IV  Hypotension: -due to above and dehydration/poor Po intake recently -continue IVF, monitor Hb -holding Bp meds  AKi on CKD 3 -baseline creatinine 1.5-1.8 -due to hypotension, poor Po and ARB -holding ARB, continue IVf today, down to 2.3 from 3.2 last pm  Atrial fibrillation: Chronic. Patient currently rate controlled at this time - Continue amiodarone, digoxin -holding warfarin, continue dig dosing, level low but will not increase dose due to AKI   Chronic Diastolic CHF -now dry, holding diuretics, s/p blood and IVF -resume diuretics in 1-2days when able  Essential hypertension - holding amlodipine, diltiazem, lasix, losartan, hydralazine due to hypotension - restart medications when Bp improved -coreg with holding parameters  COPD/emphysema - stable, Continue budesonide  - Albuterol PRN  Dyslipidemia -  continue fenofibrate   Diabetes mellitus type 2 - Held home [place]  - CBG q AC/HS with Sensitive SSI  CAD: s/p PTCA to RCA in 1988 and CABG. -resume Coreg, holding ASA and imdur  DVT prophylaxis:  SCDs Code Status: Full Family Communication: Brother at bedside Disposition Plan:  home pending GI workup   Consultants:   [facility]   Subjective: C/o mild weakness, ok otherwise  Objective: Vitals:   02/10/16 0218 02/10/16 0513 02/10/16 0635 02/10/16 0833  BP: (!) 93/48 (!) 93/59  99/61  Pulse: 69 64  87  Resp: 16 16  18   Temp: 97.8 F (36.6 C) 98.2 F (36.8 C)  98.1 F (36.7 C)  TempSrc: Oral Oral  Oral  SpO2: 96% 96%  96%  Weight:   85.6 kg (188 lb 11.2 oz)   Height:        Intake/Output Summary (Last 24 hours) at 02/10/16 1227 Last data filed at 02/10/16 [id number]  Gross per 24 hour  Intake          4202.41 ml  Output             1550 ml  Net          2652.41 ml   Filed Weights   02/09/16 1314 02/09/16 1856 02/10/16 0635  Weight: 84.4 kg (186 lb) 86.5 kg (190 lb 11.2 oz) 85.6 kg (188 lb 11.2 oz)    Examination:  General exam: AAOx3, obese, no distress Respiratory system: Clear to auscultation. Respiratory effort normal. Cardiovascular system: S1 & S2 heard, RRR. No JVD, murmurs, rubs, gallops or clicks. . Gastrointestinal system: Abdomen is nondistended, soft and nontender.  Normal bowel sounds heard. Central nervous system: Alert and oriented. No focal neurological deficits. Extremities: Symmetric 5 x 5 power. Skin: No rashes, lesions or ulcers Psychiatry: Judgement and insight appear normal. Mood & affect appropriate.     Data Reviewed: I have personally reviewed following labs and  imaging studies  CBC:  Recent Labs Lab 02/09/16 1341 02/09/16 2216 02/10/16 0939  WBC 6.8  --  5.4  NEUTROABS 4.5  --   --   HGB 7.6* 8.0* 9.1*  HCT 24.1* 24.9* 28.5*  MCV 93.1  --  89.9  PLT 269  --  [id number]   Basic Metabolic Panel:  Recent Labs Lab 02/09/16 1341 02/10/16 0939  NA 133* 138  K 4.9 4.2  CL 102 110  CO2 22 22  GLUCOSE 127* 158*  BUN 98* 71*  CREATININE 3.23* 2.25*  CALCIUM 8.7* 8.4*   GFR: Estimated Creatinine Clearance: 27.8 mL/min (by C-G formula based on SCr of 2.25 mg/dL (H)). Liver Function Tests:  Recent Labs Lab  02/09/16 1341  AST 33  ALT 19  ALKPHOS 33*  BILITOT 0.1*  PROT 7.0  ALBUMIN 3.5    Recent Labs Lab 02/09/16 1341  LIPASE 30   No results for input(s): AMMONIA in the last 168 hours. Coagulation Profile:  Recent Labs Lab 02/04/16 02/09/16 1341 02/10/16 0939  INR 3.5 3.11 3.03   Cardiac Enzymes: No results for input(s): CKTOTAL, CKMB, CKMBINDEX, TROPONINI in the last 168 hours. BNP (last 3 results) No results for input(s): PROBNP in the last 8760 hours. HbA1C: No results for input(s): HGBA1C in the last 72 hours. CBG:  Recent Labs Lab 02/10/16 0046 02/10/16 0829  GLUCAP 81 103*   Lipid Profile: No results for input(s): CHOL, HDL, LDLCALC, TRIG, CHOLHDL, LDLDIRECT in the last 72 hours. Thyroid Function Tests: No results for input(s): TSH, T4TOTAL, FREET4, T3FREE, THYROIDAB in the last 72 hours. Anemia Panel: No results for input(s): VITAMINB12, FOLATE, FERRITIN, TIBC, IRON, RETICCTPCT in the last 72 hours. Urine analysis:    Component Value Date/Time   COLORURINE YELLOW 02/09/2016 1700   APPEARANCEUR CLEAR 02/09/2016 1700   LABSPEC 1.012 02/09/2016 1700   PHURINE 5.0 02/09/2016 1700   GLUCOSEU NEGATIVE 02/09/2016 1700   HGBUR NEGATIVE 02/09/2016 1700   BILIRUBINUR NEGATIVE 02/09/2016 1700   KETONESUR NEGATIVE 02/09/2016 1700   PROTEINUR NEGATIVE 02/09/2016 1700   UROBILINOGEN 0.2 10/26/2008 1315   NITRITE NEGATIVE 02/09/2016 1700   LEUKOCYTESUR NEGATIVE 02/09/2016 1700   Sepsis Labs: @LABRCNTIP (procalcitonin:4,lacticidven:4)  )No results found for this or any previous visit (from the past 240 hour(s)).       Radiology Studies: Dg Chest 2 View  Result Date: 02/09/2016 CLINICAL DATA:  Cough.  Fatigue and falls. EXAM: CHEST  2 VIEW COMPARISON:  01/09/2016 FINDINGS: Stable postsurgical changes from CABG. Cardiomediastinal silhouette is enlarged. Mediastinal contours appear intact. There is no evidence of focal airspace consolidation, pleural effusion  or pneumothorax. Mild pulmonary vascular congestion. Osseous structures are without acute abnormality. Soft tissues are grossly normal. IMPRESSION: Enlarged cardiac silhouette with mild pulmonary vascular congestion. Electronically Signed   By: Fidela Salisbury M.D.   On: 02/09/2016 14:09        Scheduled Meds: . sodium chloride   Intravenous Once  . sodium chloride   Intravenous Once  . amiodarone  200 mg Oral Daily  . atorvastatin  80 mg Oral q1800  . [START ON 02/11/2016] digoxin  125 mcg Oral Daily  . feeding supplement  1 Container Oral TID BM  . feeding supplement (ENSURE ENLIVE)  237 mL Oral BID BM  . feeding supplement (GLUCERNA SHAKE)  237 mL Oral BID BM  . fenofibrate  54 mg Oral Daily  . guaiFENesin  600 mg Oral BID  . insulin aspart  0-5 Units Subcutaneous QHS  .  insulin aspart  0-9 Units Subcutaneous TID WC  . isosorbide mononitrate  30 mg Oral Daily  . loratadine  10 mg Oral Daily  . pantoprazole  40 mg Oral Q0600  . traZODone  100 mg Oral QHS   Continuous Infusions:   LOS: 1 day    Time spent: 44min    Domenic Polite, MD [facility] Pager [phone]  If 7PM-7AM, please contact night-coverage [email or web address] Password [facility] 02/10/2016, 12:27 PM

## 2016-02-10 NOTE — Consult Note (Signed)
[facility] Consult: 10:33 AM 02/10/2016  LOS: 1 day    Referring Provider: Dr Broadus John, [facility]  Primary Care Physician:  [name]., MD Primary Gastroenterologist:  Dr. Fuller Plan     Reason for Consultation:  Anemia. FOBT +   HPI: Mark Rivers is a 82 y.o. male.  PMH CHF-d.  CKD.  GERD.  Asbestosis, COPD.Atrial fibrillation on Coumadin.  CAD, stent 1988, CABG 10/26/2015.  Post CABG anemia requiring 2 PRBCs. Post CABG tracheostomy for management of vent support and wean. Discharged to [facility] for management of severe deconditioning, discharged to home 12/15/15.  Was on tube feeds as well as dysphagia 2 diet at discharge in 11/1 05/2015.  01/09/16-01/12/16 [facility] with heart failure rapid A fib, improved with diuresis and coreg increase for rate control. . Weight 191# at discharge with plans for possible future cardioversion.   08/2000 colonoscopy.  Tortuous, spastic colon.  Two 3 - 4 mm rectal polyps removed. Path: hyperplastic polyp mild active, non-specific proctocolitis 09/2011 colonoscopy.  Mild sigmoid diverticulosis, small internal hemorrhoids. No recurrent polyps. 10/30/201 7 CT abdomen pelvis without contrast:  No biliary or GI pathology.  Starting about 3 days ago he started having anorexia, fatigue/weakness. 2 days ago he had a couple of episodes where he fell, once onto a couch and 1 where, with the help of his brother, he was guided to the floor so no trauma. He fell because he got very dizzy and weak but he did not have any LOC or syncope.  He did not experience any chest pain, shortness of breath, perceivable tachycardia at the time. Yesterday morning, he had a single large diarrheal stool . Did not see that it was dark or bloody.  No heartburn, no abdominal pain. This compares to  daily, formed, brown stools.  The admission notes patient experiencing nausea but he adamantly denies this. What happened was that while eating Cheerios with some peaches, he regurgitated a piece of pH which she then swallowed. He does not describe dysphagia at that time nor any nausea.  Because of ongoing weakness, the patient came to the ED.   Hgb 7.6, was 9.2 -9.4 one month ago.  MCV normal.  PT/INR 32.7/3.1.  AKI: 98/3.2 compared with 58/1.8 three weeks ago. Chest x-ray shows pulmonary vascular congestion.  Patient only had 1 large, loose diarrheal stool yesterday morning. This morning he had a soft, brown stool. He has completed 1 PRBC and a second PRBC is just starting.  Patient has had some  dry, small amounts of blood when he blows his nose in the morning no epistaxis. No large bruises. No excessive or out of the ordinary bleeding.  No hematuria, no frequency or oliguria.  No PPI at home.  Social history notable for his wife being under hospice care at a facility due to advancing dementia. She entered the facility at the first of the year, just when he had been admitted to the hospital. Patient is living at home and his brother lives with him.  Last week his brother was treated for influenza via [facility]. The patient himself did not receive any Tamiflu prophylaxis. No smoking or tobacco use, no alcohol.    Past Medical History:  Diagnosis Date  . Allergy   . Back pain   . Chronic diastolic CHF (congestive heart failure) ([place]) 12/21/2015   A. Echo 10/17: Moderate LVH, EF 55-60, normal wall motion, grade 2 diastolic dysfunction, mild aortic stenosis (mean 14, peak 34), MAC, mild LAE    . CKD (chronic kidney disease) stage 3, GFR 30-59 ml/min 03/04/2014  . Coronary artery disease    a. s/p remote POBA in 1988 // b. NSTEMI in 10/17 >> LHC with 99% LM stenosis >> emergent CABG (L-LAD, S-OM2, S-PDA) - post op course complicated (prolonged ventilation, s/p Trach, AFib, anemia req  transfusion, bacteremia >> DC to LTAC)  . Diabetes mellitus   . Difficult intubation    difficult airway/FYI note 11/01/2015  . GERD (gastroesophageal reflux disease)   . History of nuclear stress test    a. Myoview 6/14 - EF 51, no ischemia or scar; Low Risk  . Hyperlipemia 09/11/2013   Last Assessment & Plan:  Lipid abnormalities are improving with treatment. Nutritional counseling was provided. and Pharmacotherapy as ordered. Lipids will be reassessed in 3 months.  . Hypertension   . Neuropathy ([place])   . On home oxygen therapy    "2L; 24/7" (02/09/2016)  . Persistent atrial fibrillation ([place]) 10/25/2015    Past Surgical History:  Procedure Laterality Date  . CARDIAC CATHETERIZATION N/A 10/26/2015   Procedure: Left Heart Cath and Coronary Angiography;  Surgeon: Troy Sine, MD;  Location: [facility];  Service: Cardiovascular;  Laterality: N/A;  . CATARACT EXTRACTION W/ INTRAOCULAR LENS  IMPLANT, BILATERAL  2008  . COLONOSCOPY    . CORONARY ANGIOPLASTY WITH STENT PLACEMENT  1988  . CORONARY ARTERY BYPASS GRAFT N/A 10/26/2015   Procedure: CORONARY ARTERY BYPASS GRAFTING (CABG) x3(LIMA to LA, SVG to OM1, SVG to PDA) with EVH from the left thigh and partial lower leg greater saphenous vein and left internal mammary artery;  Surgeon: Ivin Poot, MD;  Location: [facility];  Service: Open Heart Surgery;  Laterality: N/A;  . TEE WITHOUT CARDIOVERSION N/A 10/26/2015   Procedure: TRANSESOPHAGEAL ECHOCARDIOGRAM (TEE);  Surgeon: Ivin Poot, MD;  Location: [facility];  Service: Open Heart Surgery;  Laterality: N/A;  . TONSILLECTOMY      Prior to Admission medications   Medication Sig Start Date End Date Taking? Authorizing Provider  amiodarone (PACERONE) 200 MG tablet Take 1 tablet (200 mg total) by mouth as directed. 1 tab twice daily for 2 weeks; then decrease to 1 tab once a day Patient taking differently: Take 200 mg by mouth daily.  01/17/16  Yes Scott T Kathlen Mody, PA-C  amLODipine  (NORVASC) 10 MG tablet Take 0.5 tablets (5 mg total) by mouth daily. 01/12/16  Yes Cheryln Manly, NP  aspirin EC 81 MG tablet Take  81 mg by mouth daily.    Yes Historical Provider, MD  atorvastatin (LIPITOR) 80 MG tablet Take 1 tablet (80 mg total) by mouth daily. 12/21/15 03/20/16 Yes Scott T Weaver, PA-C  carvedilol (COREG) 3.125 MG tablet Take 2 tablets (6.25 mg total) by mouth 2 (two) times daily. Patient taking differently: Take 3.125 mg by mouth 2 (two) times daily.  01/12/16 04/11/16 Yes Cheryln Manly, NP  cholecalciferol (VITAMIN D) 1000 units tablet Take 1,000 Units by mouth daily.   Yes Historical Provider, MD  digoxin (LANOXIN) 0.125 MG tablet Take 125 mcg by mouth daily. 12/15/15  Yes Historical Provider, MD  diltiazem (CARDIZEM) 30 MG tablet Take 30 mg by mouth 2 (two) times daily. 12/15/15  Yes Historical Provider, MD  diphenhydramine-acetaminophen (TYLENOL PM) 25-500 MG TABS tablet Take 2 tablets by mouth at bedtime as needed (for sleep).    Yes Historical Provider, MD  fenofibrate 54 MG tablet Take 54 mg by mouth daily.   Yes Historical Provider, MD  furosemide (LASIX) 40 MG tablet Take 0.5 tablets (20 mg total) by mouth daily. Patient taking differently: Take 40 mg by mouth daily.  01/18/16 04/17/16 Yes Scott T Weaver, PA-C  Glucosamine-Chondroit-Vit C-Mn (GLUCOSAMINE CHONDR 1500 COMPLX PO) Take 2 tablets by mouth daily.    Yes Historical Provider, MD  hydrALAZINE (APRESOLINE) 50 MG tablet Take 75 mg by mouth 3 (three) times daily.   Yes Historical Provider, MD  isosorbide mononitrate (IMDUR) 30 MG 24 hr tablet Take 1 tablet (30 mg total) by mouth daily. 10/20/15 02/09/16 Yes Isaiah Serge, NP  levocetirizine (XYZAL) 5 MG tablet Take 0.5 tablets (2.5 mg total) by mouth every evening. 08/25/15  Yes Adelina Mings, MD  losartan (COZAAR) 100 MG tablet Take 1 tablet (100 mg total) by mouth daily. 12/24/14  Yes Josue Hector, MD  metoprolol tartrate (LOPRESSOR) 25 MG tablet Take 25 mg  by mouth 2 (two) times daily. 12/16/15  Yes Historical Provider, MD  TOUJEO SOLOSTAR 300 UNIT/ML SOPN Inject 30 Units into the skin every morning.  08/15/14  Yes Historical Provider, MD  traZODone (DESYREL) 100 MG tablet Take 100 mg by mouth at bedtime.    Yes Historical Provider, MD  warfarin (COUMADIN) 1 MG tablet Take 4 tablets (4 mg total) by mouth daily at 6 PM. Patient taking differently: Take 2-3 mg by mouth See admin instructions. Take 3 mg by mouth on Sunday and Thursday. Take 2 mg by mouth on all other days 01/12/16  Yes Cheryln Manly, NP  budesonide (PULMICORT) 0.5 MG/2ML nebulizer solution Take 0.5 mg by nebulization daily.  12/20/15 12/19/16  Historical Provider, MD    Scheduled Meds: . sodium chloride   Intravenous STAT  . sodium chloride   Intravenous Once  . sodium chloride   Intravenous Once  . amiodarone  200 mg Oral Daily  . atorvastatin  80 mg Oral q1800  . [START ON 02/11/2016] digoxin  125 mcg Oral Daily  . feeding supplement  1 Container Oral TID BM  . feeding supplement (ENSURE ENLIVE)  237 mL Oral BID BM  . fenofibrate  54 mg Oral Daily  . guaiFENesin  600 mg Oral BID  . insulin aspart  0-5 Units Subcutaneous QHS  . insulin aspart  0-9 Units Subcutaneous TID WC  . isosorbide mononitrate  30 mg Oral Daily  . loratadine  10 mg Oral Daily  . [START ON 02/13/2016] pantoprazole  40 mg Intravenous Q12H  . phytonadione (VITAMIN K)  IV  5 mg Intravenous Once  . traZODone  100 mg Oral QHS   Infusions: . pantoprozole (PROTONIX) infusion 8 mg/hr (02/09/16 2250)   PRN Meds: acetaminophen **OR** acetaminophen, albuterol, diphenhydrAMINE, ipratropium-albuterol, ondansetron **OR** ondansetron (ZOFRAN) IV   Allergies as of 02/09/2016 - Review Complete 02/09/2016  Allergen Reaction Noted  . Amoxicillin Other (See Comments) 09/05/2011  . Cephalexin Other (See Comments) 09/05/2011  . Penicillin g Other (See Comments) 03/08/2015    Family History  Problem Relation Age of  Onset  . Diabetes Father   . Colon cancer Neg Hx   . Esophageal cancer Neg Hx   . Stomach cancer Neg Hx   . Rectal cancer Neg Hx   . Allergic rhinitis Neg Hx   . Angioedema Neg Hx   . Asthma Neg Hx   . Eczema Neg Hx   . Immunodeficiency Neg Hx   . Urticaria Neg Hx     Social History   Social History  . Marital status: Married    Spouse name: N/A  . Number of children: N/A  . Years of education: N/A   Occupational History  . retired    Social History Main Topics  . Smoking status: Former Smoker    Packs/day: 2.00    Years: 30.00    Types: Cigarettes    Quit date: 06/28/2004  . Smokeless tobacco: Never Used  . Alcohol use No     Comment: QUIT 06/28/2004  . Drug use: No  . Sexual activity: Yes   Other Topics Concern  . Not on file   Social History Narrative  . No narrative on file    REVIEW OF SYSTEMS: Constitutional:  Per HPI ENT:  No nose bleeds Pulm:   Stable DOE CV:  No palpitations, no LE edema.  no PND/orthopnea. GU:  No hematuria, no frequency GI:  Peer HPI.  Patient has been eating/drinking all textures of food Heme:  Per HPI   Transfusions:   2 PRBCs during his admission October -November 2017 Neuro:  No headaches, no peripheral tingling or numbness Derm:  No itching, no rash or sores.  Endocrine:  No sweats or chills.  No polyuria or dysuria Immunization:  Flu shot is current. Travel:  None beyond local counties in last few months.    PHYSICAL EXAM: Vital signs in last 24 hours: Vitals:   02/10/16 0513 02/10/16 0833  BP: (!) 93/59 99/61  Pulse: 64 87  Resp: 16 18  Temp: 98.2 F (36.8 C) 98.1 F (36.7 C)   Wt Readings from Last 3 Encounters:  02/10/16 85.6 kg (188 lb 11.2 oz)  01/17/16 87.9 kg (193 lb 12.8 oz)  01/12/16 86.7 kg (191 lb 1.6 oz)    General: Pleasant, frail-appearing elderly WF. He is comfortable. Head:   No signs of head trauma.  Eyes:   No scleral icterus, no conjunctival pallor Ears:   Slightly HOH  Nose:   No  discharge or congestion Mouth:   Tongue midline. Oral mucosa pink, moist and clear. Neck:   No JVD, no masses. No thyromegaly. Lungs:   Diminished breath sounds but no adventitious sounds. Slight dry cough, no dyspnea with speaking or at rest Heart:  irregularly irregular. Rate not accelerated. S1, S2 audible. No MRG. Abdomen:   Soft. Not tender or distended. Bowel sounds active. No HSM, hernias, bruits. GU:  No scrotal edema  Rectal:  deferred rectal exam. Nerves described his stool as brown, soft.   Musc/Skeltl:  no joint erythema, gross  contracture deformities or gross swelling. Extremities:   No CCE.  Neurologic:   Patient is alert. Oriented times 3. Moves all 4 limbs, strength was not tested. No tremor or asterixis. Cranial nerves intact. Skin:   No telangiectasia, sores or suspicious lesions. Tattoos:   None observed Nodes:   No cervical or inguinal adenopathy.   Psych:   Pleasant, got sad and just a bit tearful while discussing the situation with his wife.  Cooperative.  Intake/Output from previous day: 01/31 0701 - 02/01 0700 In: 3500.4 [P.O.:360; I.V.:1470.4; Blood:670; IV Piggyback:1000] Out: 1350 [Urine:1350] Intake/Output this shift: Total I/O In: 702 [P.O.:702] Out: 200 [Urine:200]  LAB RESULTS:  Recent Labs  02/09/16 1341 02/09/16 2216  WBC 6.8  --   HGB 7.6* 8.0*  HCT 24.1* 24.9*  PLT 269  --    BMET Lab Results  Component Value Date   NA 133 (L) 02/09/2016   NA 140 01/17/2016   NA 139 01/12/2016   K 4.9 02/09/2016   K 5.1 01/17/2016   K 4.1 01/12/2016   CL 102 02/09/2016   CL 98 01/17/2016   CL 99 (L) 01/12/2016   CO2 22 02/09/2016   CO2 24 01/17/2016   CO2 32 01/12/2016   GLUCOSE 127 (H) 02/09/2016   GLUCOSE 83 01/17/2016   GLUCOSE 103 (H) 01/12/2016   BUN 98 (H) 02/09/2016   BUN 58 (H) 01/17/2016   BUN 33 (H) 01/12/2016   CREATININE 3.23 (H) 02/09/2016   CREATININE 1.87 (H) 01/17/2016   CREATININE 1.56 (H) 01/12/2016   CALCIUM 8.7 (L)  02/09/2016   CALCIUM 9.8 01/17/2016   CALCIUM 8.7 (L) 01/12/2016   LFT  Recent Labs  02/09/16 1341  PROT 7.0  ALBUMIN 3.5  AST 33  ALT 19  ALKPHOS 33*  BILITOT 0.1*   PT/INR Lab Results  Component Value Date   INR 3.03 02/10/2016   INR 3.11 02/09/2016   INR 3.5 02/04/2016   Hepatitis Panel No results for input(s): HEPBSAG, HCVAB, HEPAIGM, HEPBIGM in the last 72 hours. C-Diff No components found for: CDIFF Lipase     Component Value Date/Time   LIPASE 30 02/09/2016 1341    Drugs of Abuse  No results found for: LABOPIA, COCAINSCRNUR, LABBENZ, AMPHETMU, THCU, LABBARB   RADIOLOGY STUDIES: Dg Chest 2 View  Result Date: 02/09/2016 CLINICAL DATA:  Cough.  Fatigue and falls. EXAM: CHEST  2 VIEW COMPARISON:  01/09/2016 FINDINGS: Stable postsurgical changes from CABG. Cardiomediastinal silhouette is enlarged. Mediastinal contours appear intact. There is no evidence of focal airspace consolidation, pleural effusion or pneumothorax. Mild pulmonary vascular congestion. Osseous structures are without acute abnormality. Soft tissues are grossly normal. IMPRESSION: Enlarged cardiac silhouette with mild pulmonary vascular congestion. Electronically Signed   By: Fidela Salisbury M.D.   On: 02/09/2016 14:09     IMPRESSION:   *  Normocytic anemia.  FOBT +.  Non-bloody emesis and loose stools.  No PPI prophylaxis at home. Suspect AVMs versus acid peptic disorder/ulcer.  By the end of today will have been transfused 2 PRBCs.  *  Atrial fibrillation, on chronic Coumadin. Slightly supratherapeutic INR.  Given 5 mg IV vitamin K x 1.   *  AKI.    *  Close exposure to brother who was treated last week for influenza. Patient is not having symptoms consistent with flu, x-ray doesn't support diagnosis of CAP. He is on prophylactic droplet precautions.  *  Status post CABG x 3 vsl 10/2015.  Complicated postoperative course due  to respiratory failure and deconditioning. Required temporary  tracheostomy.  Has recovered from all of this.  *  Previous hyperplastic colon polyps, nonspecific proctitis and mild sigmoid diverticulosis on previous colonoscopies 2008 and 2013.    PLAN:     *  Pursue EGD once INR corrected. Perhaps could pursue this as soon as tomorrow.  Explained the procedure and risks to the patient and he is agreeable to pursue EGD on the time comes.   *  Patient needs his CODE STATUS changed from full code to DO NOT RESUSCITATE. Due to hospice involvement for his wife, patient is well aware of his end of life decisions and does not wish for any resuscitation.  *  Allow diet. Also he doesn't need a PPI drip, so switched him to daily oral Protonix.   Azucena Freed  02/10/2016, 10:33 AM Pager: [phone]     Attending physician's note   I have taken a history, examined the patient and reviewed the chart. I agree with the Advanced Practitioner's note, impression and recommendations. Normocytic anemia and heme + stool without overt GI bleeding noted. R/O ulcer, gastritis, AVMs, etc. Tentatively have scheduled EGD for 0930 tomorrow if INR < 2.0. Vit K given and Coumadin on hold. Agree PPI and with transfusions to maintain Hb > 8.  [name], MD [facility] [phone] Mon-Fri 8a-5p [phone] after 5p, weekends, holidays

## 2016-02-10 NOTE — Care Management Note (Signed)
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   02/10/2016 - Received consult - reports difficulty in continue home nebulizer; CM talked to patient, he stated " I have the machine at home and there is nothing wrong with it, I just don't have medication to put in it." Parmele RN  Expected Discharge Date:     Possibly 02/13/2016             Expected Discharge Plan:  [place]  Discharge planning Services  CM Consult Choice offered to:  Patient  HH Arranged:  RN, PT [facility] Agency:  [facility]  Status of Service:  In process, will continue to follow  Sherrilyn Rist [id number] 02/10/2016, 10:17 AM

## 2016-02-11 ENCOUNTER — Inpatient Hospital Stay (HOSPITAL_COMMUNITY): Payer: Medicare Other | Admitting: Certified Registered Nurse Anesthetist

## 2016-02-11 ENCOUNTER — Encounter (HOSPITAL_COMMUNITY)
Admission: EM | Disposition: A | Discharge status: Home Health | Payer: Self-pay | Source: Home / Self Care | Attending: Internal Medicine

## 2016-02-11 ENCOUNTER — Encounter (HOSPITAL_COMMUNITY): Payer: Self-pay

## 2016-02-11 DIAGNOSIS — D62 Acute posthemorrhagic anemia: Secondary | ICD-10-CM

## 2016-02-11 DIAGNOSIS — D689 Coagulation defect, unspecified: Secondary | ICD-10-CM

## 2016-02-11 DIAGNOSIS — K3189 Other diseases of stomach and duodenum: Secondary | ICD-10-CM

## 2016-02-11 DIAGNOSIS — K2981 Duodenitis with bleeding: Secondary | ICD-10-CM

## 2016-02-11 DIAGNOSIS — T45515A Adverse effect of anticoagulants, initial encounter: Secondary | ICD-10-CM

## 2016-02-11 DIAGNOSIS — K2980 Duodenitis without bleeding: Secondary | ICD-10-CM | POA: Diagnosis present

## 2016-02-11 DIAGNOSIS — K31811 Angiodysplasia of stomach and duodenum with bleeding: Principal | ICD-10-CM

## 2016-02-11 DIAGNOSIS — J101 Influenza due to other identified influenza virus with other respiratory manifestations: Secondary | ICD-10-CM | POA: Diagnosis present

## 2016-02-11 DIAGNOSIS — D6832 Hemorrhagic disorder due to extrinsic circulating anticoagulants: Secondary | ICD-10-CM

## 2016-02-11 HISTORY — PX: ESOPHAGOGASTRODUODENOSCOPY (EGD) WITH PROPOFOL: SHX5813

## 2016-02-11 LAB — BASIC METABOLIC PANEL
ANION GAP: 5 (ref 5–15)
BUN: 45 mg/dL — ABNORMAL HIGH (ref 6–20)
CALCIUM: 8.6 mg/dL — AB (ref 8.9–10.3)
CO2: 26 mmol/L (ref 22–32)
CREATININE: 1.68 mg/dL — AB (ref 0.61–1.24)
Chloride: 108 mmol/L (ref 101–111)
GFR calc Af Amer: 43 mL/min — ABNORMAL LOW (ref >60)
GFR calc non Af Amer: 37 mL/min — ABNORMAL LOW (ref >60)
GLUCOSE: 92 mg/dL (ref 65–99)
Potassium: 4.6 mmol/L (ref 3.5–5.1)
Sodium: 139 mmol/L (ref 135–145)

## 2016-02-11 LAB — UREA NITROGEN, URINE: UREA NITROGEN UR: 768 mg/dL

## 2016-02-11 LAB — CBC
HCT: 29.3 % — ABNORMAL LOW (ref 39.0–52.0)
Hemoglobin: 9.2 g/dL — ABNORMAL LOW (ref 13.0–17.0)
MCH: 28.5 pg (ref 26.0–34.0)
MCHC: 31.4 g/dL (ref 30.0–36.0)
MCV: 90.7 fL (ref 78.0–100.0)
Platelets: 209 10*3/uL (ref 150–400)
RBC: 3.23 MIL/uL — ABNORMAL LOW (ref 4.22–5.81)
RDW: 17.3 % — AB (ref 11.5–15.5)
WBC: 5.4 10*3/uL (ref 4.0–10.5)

## 2016-02-11 LAB — PROTIME-INR
INR: 1.32
PROTHROMBIN TIME: 16.4 s — AB (ref 11.4–15.2)

## 2016-02-11 LAB — GLUCOSE, CAPILLARY
GLUCOSE-CAPILLARY: 131 mg/dL — AB (ref 65–99)
GLUCOSE-CAPILLARY: 127 mg/dL — AB (ref 65–99)
Glucose-Capillary: 92 mg/dL (ref 65–99)
Glucose-Capillary: 84 mg/dL (ref 65–99)

## 2016-02-11 LAB — CREATININE, URINE, RANDOM: Creatinine, Urine: 52.48 mg/dL

## 2016-02-11 LAB — SODIUM, URINE, RANDOM: SODIUM UR: 70 mmol/L

## 2016-02-11 SURGERY — ESOPHAGOGASTRODUODENOSCOPY (EGD) WITH PROPOFOL
Anesthesia: Monitor Anesthesia Care

## 2016-02-11 MED ORDER — INSULIN ASPART 100 UNIT/ML ~~LOC~~ SOLN
0.0000 [IU] | Freq: Three times a day (TID) | SUBCUTANEOUS | Status: DC
  Administered 2016-02-11 – 2016-02-13 (×4): 1 [IU] via SUBCUTANEOUS

## 2016-02-11 MED ORDER — FENTANYL CITRATE (PF) 250 MCG/5ML IJ SOLN
INTRAMUSCULAR | Status: DC | PRN
  Administered 2016-02-11 (×2): 25 ug via INTRAVENOUS

## 2016-02-11 MED ORDER — PROPOFOL 10 MG/ML IV BOLUS
INTRAVENOUS | Status: DC | PRN
  Administered 2016-02-11 (×2): 20 mg via INTRAVENOUS
  Administered 2016-02-11: 10 mg via INTRAVENOUS

## 2016-02-11 MED ORDER — SODIUM CHLORIDE 0.9 % IV SOLN
INTRAVENOUS | Status: DC | PRN
  Administered 2016-02-11: 10:00:00 via INTRAVENOUS

## 2016-02-11 MED ORDER — CARVEDILOL 6.25 MG PO TABS
6.2500 mg | ORAL_TABLET | Freq: Two times a day (BID) | ORAL | Status: DC
  Administered 2016-02-11 – 2016-02-13 (×5): 6.25 mg via ORAL
  Filled 2016-02-11 (×5): qty 1

## 2016-02-11 MED ORDER — OSELTAMIVIR PHOSPHATE 30 MG PO CAPS
30.0000 mg | ORAL_CAPSULE | Freq: Two times a day (BID) | ORAL | Status: DC
  Administered 2016-02-11 – 2016-02-13 (×5): 30 mg via ORAL
  Filled 2016-02-11 (×6): qty 1

## 2016-02-11 MED ORDER — LIDOCAINE HCL (CARDIAC) 20 MG/ML IV SOLN
INTRAVENOUS | Status: DC | PRN
  Administered 2016-02-11: 40 mg via INTRATRACHEAL

## 2016-02-11 NOTE — Progress Notes (Signed)
Spoke with Endoscopy. Pt ready for EGD at 09:30 if INR less than 2.0. Consent signed. Pt NPO since midnight. INR lab order changed to STAT. Will continue to monitor.

## 2016-02-11 NOTE — Op Note (Signed)
[facility] Patient Name: Mark Rivers Procedure Date : 02/11/2016 MRN: [id number] Attending MD: Ladene Artist , MD Date of Birth: 1934-12-23 CSN: [id number] Age: 82 Admit Type: Inpatient Procedure:                Upper GI endoscopy Indications:              Acute post hemorrhagic anemia, Heme positive stool Providers:                Pricilla Riffle. Fuller Plan, MD, Cleda Daub, RN, William Dalton, Technician Referring MD:              Medicines:                Monitored Anesthesia Care Complications:            No immediate complications. Estimated Blood Loss:     Estimated blood loss was minimal. Procedure:                Pre-Anesthesia Assessment:                           - Prior to the procedure, a History and Physical                            was performed, and patient medications and                            allergies were reviewed. The patient's tolerance of                            previous anesthesia was also reviewed. The risks                            and benefits of the procedure and the sedation                            options and risks were discussed with the patient.                            All questions were answered, and informed consent                            was obtained. Prior Anticoagulants: The patient has                            taken Coumadin (warfarin), last dose was 1 day                            prior to procedure. ASA Grade Assessment: III - A                            patient with severe systemic disease. After  reviewing the risks and benefits, the patient was                            deemed in satisfactory condition to undergo the                            procedure.                           After obtaining informed consent, the endoscope was                            passed under direct vision. Throughout the                            procedure, the patient's  blood pressure, pulse, and                            oxygen saturations were monitored continuously. The                            EG-[id number] ([id number]) scope was introduced through the                            mouth, and advanced to the second part of duodenum.                            The upper GI endoscopy was accomplished without                            difficulty. The patient tolerated the procedure                            well. Scope In: Scope Out: Findings:      The examined esophagus was normal.      A single 4 mm bleeding angiodysplastic lesion was found on the greater       curvature of the gastric body. Coagulation for hemostasis using argon       plasma was successful.      A single 6 mm mucosal papule (nodule) with no bleeding and no stigmata       of recent bleeding was found in the cardia. Biopsies were taken with a       cold forceps for histology.      The exam of the stomach was otherwise normal.      Erosions noted in the duodenal bulb and the second portion of the       duodenum was normal. Impression:               - Normal esophagus.                           - A single bleeding angiodysplastic lesion in the                            stomach. Treated with argon plasma coagulation                            (  APC).                           - A single mucosal papule (nodule) found in the                            stomach. Biopsied.                           - Erosive duodentitis. Moderate Sedation:      none/MAC Recommendation:           - Return patient to hospital ward for ongoing care.                           - Resume previous diet.                           - Continue present medications.                           - OK to resume Coumadin (warfarin) at prior dose                            tomorrow. Refer to managing physician for further                            adjustment of therapy.                           - Await pathology results.                            - No aspirin, ibuprofen, naproxen, or other                            non-steroidal anti-inflammatory drugs for 2 weeks.                           - Protonix (pantoprazole) 40 mg PO daily                            indefinitely. Procedure Code(s):        --- Professional ---                           27035, 11, Esophagogastroduodenoscopy, flexible,                            transoral; with control of bleeding, any method                           43239, Esophagogastroduodenoscopy, flexible,                            transoral; with biopsy, single or multiple Diagnosis Code(s):        --- Professional ---  K31.811, Angiodysplasia of stomach and duodenum                            with bleeding                           K31.89, Other diseases of stomach and duodenum                           D62, Acute posthemorrhagic anemia                           R19.5, Other fecal abnormalities CPT copyright 2016 American Medical Association. All rights reserved. The codes documented in this report are preliminary and upon coder review may  be revised to meet current compliance requirements. Ladene Artist, MD 02/11/2016 10:18:21 AM This report has been signed electronically. Number of Addenda: 0

## 2016-02-11 NOTE — Progress Notes (Signed)
PROGRESS NOTE    Mark Rivers  [id number] DOB: 05-26-1934 DOA: 02/09/2016 PCP: [name]., MD   Brief Narrative:  Mark Rivers a 82 y.o.malewith medical history significant of HTN, CAD, chronic respiratory failure diastolic CHF last EF 0000000 10/2015, COPD, oxygen independent 2 L [place] O2, afib on Coumadin, DM type II, and CKD; who presents with complaints of generalized weakness over the last 2-3 days. Found to have drop inHb to 7.6 from 9.5 and hemoccult positive, INR 3.1 Given 1unit PRBC   Assessment & Plan:   Principal Problem:   GI bleed Active Problems:   Diabetes mellitus with stage 3 chronic kidney disease (HCC)   Persistent atrial fibrillation (HCC)   Chronic diastolic CHF (congestive heart failure) (HCC)   Symptomatic anemia   Acute renal failure superimposed on chronic kidney disease (HCC)   Malnutrition of moderate degree   Influenza A   Upper GI bleed secondary to duodenitis and bleeding angiodysplasic lesion on greater curvature gastric body Patient was admitted with generalized weakness, fatigue, dizziness. Patient denied any syncope or loss of consciousness. exam with the initial hemoglobin 7.6 previously 9.4 just 3 weeks ago. -s/p 1unit PRBC - hemoglobin currently stable at 9.2. - Patient status post upper endoscopy 02/11/2016 with single bleeding angiodysplastic lesion in the stomach status: Post argon plasma coagulation, single mucosal papule found in the stomach, erosive duodenitis. -Patient has been transitioned to oral Protonix. Patient to avoid incense, aspirin, ibuprofen, anti-inflammatory drugs for at least 2 weeks and needs to be on a PPI Protonix 40 mg daily indefinitely. The GI okay to resume patient's Coumadin tomorrow at prior home dose.  Influenza A H3 The respiratory viral panel which was positive for influenza A H3. Place patient on Tamiflu. Supportive care. Follow.  Hypotension: -due to above and dehydration/poor Po intake  recently -BP with some improvement with hydration and transfusion of packed red blood cells. Antihypertensive medications on hold. Follow.  AKi on CKD 3 -baseline creatinine 1.5-1.8 -due to hypotension, poor Po and ARB -holding ARB, status post gentle hydration. Creatinine now at 1.68 from 2.25 from 3.2. -Renal ultrasound negative for hydronephrosis.  Atrial fibrillation: Chronic. Patient currently rate controlled at this time - Continue amiodarone, digoxin, Coreg. -continue dig dosing, level low but will not increase dose due to AKI - Per gastroenterology okay to resume Coumadin at home dose tomorrow 02/12/2016.   Chronic Diastolic CHF -now dry, holding diuretics, s/p blood and IVF -resume diuretics in 1-2days when able  Essential hypertension - holding amlodipine, diltiazem, lasix, losartan, hydralazine due to hypotension -restart medications when Bp improved -coreg with holding parameters  COPD/emphysema - stable, Continue budesonide  - Albuterol PRN  Dyslipidemia - continue fenofibrate   Diabetes mellitus type 2 -Hemoglobin A1c was 5.7 on 11/27/2015.  - Held home [place]  - CBG have ranged from 84 -131. Continue sliding scale insulin.  CAD: s/p PTCA to RCA in 1988 and CABG. -resumed Coreg, holding ASA and imdur   DVT prophylaxis: SCDs Code Status: DO NOT RESUSCITATE Family Communication: Updated patient. No family at bedside. Disposition Plan: Home once hemoglobin has stabilized, malaise and fatigue have improved and improvement with hypoxia and resumption of Coumadin  and resolution of acute on chronic kidney disease hopefully in 1-2 days.   Consultants:   Gastroenterology: Dr. Fuller Plan 02/10/2016  Procedures:   Upper endoscopy 02/11/2016 per Dr. Fuller Plan  Chest x-ray 02/09/2016  Renal ultrasound 02/10/2016  1 unit packed red blood cells.  Antimicrobials:   Tamiflu 02/11/2016   Subjective:  Patient denies any overt bleeding. No chest pain.  Patient denies shortness of breath. Coughing improved.  Objective: Vitals:   02/11/16 1000 02/11/16 1018 02/11/16 1023 02/11/16 1028  BP:  (!) 98/54 115/66 (!) 114/45  Pulse: 76 69 94 86  Resp:  20 18 (!) 25  Temp:  97.6 F (36.4 C)    TempSrc:  Oral    SpO2:  95% 96% 96%  Weight:      Height:        Intake/Output Summary (Last 24 hours) at 02/11/16 1248 Last data filed at 02/11/16 1053  Gross per 24 hour  Intake           595.83 ml  Output             2050 ml  Net         -1454.17 ml   Filed Weights   02/09/16 1856 02/10/16 0635 02/11/16 0548  Weight: 86.5 kg (190 lb 11.2 oz) 85.6 kg (188 lb 11.2 oz) 85.4 kg (188 lb 3.2 oz)    Examination:  General exam: Appears calm and comfortable  Respiratory system: Some scattered coarse breath sounds.  Cardiovascular system: S1 & S2 heard, RRR. No JVD, murmurs, rubs, gallops or clicks. No pedal edema. Gastrointestinal system: Abdomen is nondistended, soft and nontender. No organomegaly or masses felt. Normal bowel sounds heard. Central nervous system: Alert and oriented. No focal neurological deficits. Extremities: Symmetric 5 x 5 power. Skin: No rashes, lesions or ulcers Psychiatry: Judgement and insight appear normal. Mood & affect appropriate.     Data Reviewed: I have personally reviewed following labs and imaging studies  CBC:  Recent Labs Lab 02/09/16 1341 02/09/16 2216 02/10/16 0939 02/11/16 0547  WBC 6.8  --  5.4 5.4  NEUTROABS 4.5  --   --   --   HGB 7.6* 8.0* 9.1* 9.2*  HCT 24.1* 24.9* 28.5* 29.3*  MCV 93.1  --  89.9 90.7  PLT 269  --  209 [id number]   Basic Metabolic Panel:  Recent Labs Lab 02/09/16 1341 02/10/16 0939 02/11/16 1118  NA 133* 138 139  K 4.9 4.2 4.6  CL 102 110 108  CO2 22 22 26   GLUCOSE 127* 158* 92  BUN 98* 71* 45*  CREATININE 3.23* 2.25* 1.68*  CALCIUM 8.7* 8.4* 8.6*   GFR: Estimated Creatinine Clearance: 37.2 mL/min (by C-G formula based on SCr of 1.68 mg/dL (H)). Liver Function  Tests:  Recent Labs Lab 02/09/16 1341  AST 33  ALT 19  ALKPHOS 33*  BILITOT 0.1*  PROT 7.0  ALBUMIN 3.5    Recent Labs Lab 02/09/16 1341  LIPASE 30   No results for input(s): AMMONIA in the last 168 hours. Coagulation Profile:  Recent Labs Lab 02/09/16 1341 02/10/16 0939 02/11/16 0636  INR 3.11 3.03 1.32   Cardiac Enzymes: No results for input(s): CKTOTAL, CKMB, CKMBINDEX, TROPONINI in the last 168 hours. BNP (last 3 results) No results for input(s): PROBNP in the last 8760 hours. HbA1C: No results for input(s): HGBA1C in the last 72 hours. CBG:  Recent Labs Lab 02/10/16 1246 02/10/16 1705 02/10/16 2231 02/11/16 0717 02/11/16 1138  GLUCAP 118* 79 112* 92 84   Lipid Profile: No results for input(s): CHOL, HDL, LDLCALC, TRIG, CHOLHDL, LDLDIRECT in the last 72 hours. Thyroid Function Tests: No results for input(s): TSH, T4TOTAL, FREET4, T3FREE, THYROIDAB in the last 72 hours. Anemia Panel: No results for input(s): VITAMINB12, FOLATE, FERRITIN, TIBC, IRON, RETICCTPCT in the last 72 hours.  Sepsis Labs:  Recent Labs Lab 02/09/16 1356  LATICACIDVEN 1.04    Recent Results (from the past 240 hour(s))  Respiratory Panel by PCR     Status: Abnormal   Collection Time: 02/10/16  6:09 AM  Result Value Ref Range Status   Adenovirus NOT DETECTED NOT DETECTED Final   Coronavirus 229E NOT DETECTED NOT DETECTED Final   Coronavirus HKU1 NOT DETECTED NOT DETECTED Final   Coronavirus NL63 NOT DETECTED NOT DETECTED Final   Coronavirus OC43 NOT DETECTED NOT DETECTED Final   Metapneumovirus NOT DETECTED NOT DETECTED Final   Rhinovirus / Enterovirus NOT DETECTED NOT DETECTED Final   Influenza A H3 DETECTED (A) NOT DETECTED Final   Influenza B NOT DETECTED NOT DETECTED Final   Parainfluenza Virus 1 NOT DETECTED NOT DETECTED Final   Parainfluenza Virus 2 NOT DETECTED NOT DETECTED Final   Parainfluenza Virus 3 NOT DETECTED NOT DETECTED Final   Parainfluenza Virus 4 NOT  DETECTED NOT DETECTED Final   Respiratory Syncytial Virus NOT DETECTED NOT DETECTED Final   Bordetella pertussis NOT DETECTED NOT DETECTED Final   Chlamydophila pneumoniae NOT DETECTED NOT DETECTED Final   Mycoplasma pneumoniae NOT DETECTED NOT DETECTED Final         Radiology Studies: Dg Chest 2 View  Result Date: 02/09/2016 CLINICAL DATA:  Cough.  Fatigue and falls. EXAM: CHEST  2 VIEW COMPARISON:  01/09/2016 FINDINGS: Stable postsurgical changes from CABG. Cardiomediastinal silhouette is enlarged. Mediastinal contours appear intact. There is no evidence of focal airspace consolidation, pleural effusion or pneumothorax. Mild pulmonary vascular congestion. Osseous structures are without acute abnormality. Soft tissues are grossly normal. IMPRESSION: Enlarged cardiac silhouette with mild pulmonary vascular congestion. Electronically Signed   By: Fidela Salisbury M.D.   On: 02/09/2016 14:09   US Renal  Result Date: 02/10/2016 CLINICAL DATA:  Acute renal failure. History of coronary artery disease, CHF, atrial fibrillation, diabetes, former smoker. EXAM: RENAL / URINARY TRACT ULTRASOUND COMPLETE COMPARISON:  Report of a renal ultrasound dated October 31, 2013 FINDINGS: Right Kidney: Length: 11.0 cm. The renal cortical echotexture is approximately equal to that of the adjacent liver. There is no hydronephrosis nor cystic or solid mass. Left Kidney: Length: 11.7 cm. The renal cortical echotexture is similar to that on the right. There is no hydronephrosis nor discrete mass. Bladder: Appears normal for degree of bladder distention. Bilateral ureteral jets are observed. IMPRESSION: Mildly increased renal cortical echotexture compatible with medical renal disease. There is no evidence of obstruction or other acute abnormality. Electronically Signed   By: David  Martinique M.D.   On: 02/10/2016 12:29        Scheduled Meds: . amiodarone  200 mg Oral Daily  . atorvastatin  80 mg Oral q1800  .  carvedilol  6.25 mg Oral BID WC  . digoxin  125 mcg Oral Daily  . feeding supplement (GLUCERNA SHAKE)  237 mL Oral BID BM  . fenofibrate  54 mg Oral Daily  . guaiFENesin  600 mg Oral BID  . insulin aspart  0-5 Units Subcutaneous QHS  . insulin aspart  0-9 Units Subcutaneous TID WC  . loratadine  10 mg Oral Daily  . oseltamivir  30 mg Oral BID  . pantoprazole  40 mg Oral Q0600  . traZODone  100 mg Oral QHS   Continuous Infusions:   LOS: 2 days    Time spent: 36 mins    THOMPSON,DANIEL, MD [facility] Pager [phone]  If 7PM-7AM, please contact night-coverage [email or web address] Password  [email or web address] 02/11/2016, 12:48 PM

## 2016-02-11 NOTE — Interval H&P Note (Signed)
History and Physical Interval Note:  02/11/2016 9:45 AM  Mark Rivers  has presented today for surgery, with the diagnosis of anemia, blood detected in stool  The various methods of treatment have been discussed with the patient and family. After consideration of risks, benefits and other options for treatment, the patient has consented to  Procedure(s): ESOPHAGOGASTRODUODENOSCOPY (EGD) WITH PROPOFOL (N/A) as a surgical intervention .  The patient's history has been reviewed, patient examined, no change in status, stable for surgery.  I have reviewed the patient's chart and labs.  Questions were answered to the patient's satisfaction.     Pricilla Riffle. Fuller Plan

## 2016-02-11 NOTE — H&P (View-Only) (Signed)
[facility] Consult: 10:33 AM 02/10/2016  LOS: 1 day    Referring Provider: Dr Broadus John, [facility]  Primary Care Physician:  [name]., MD Primary Gastroenterologist:  Dr. Fuller Plan     Reason for Consultation:  Anemia. FOBT +   HPI: Mark Rivers is a 82 y.o. male.  PMH CHF-d.  CKD.  GERD.  Asbestosis, COPD.Atrial fibrillation on Coumadin.  CAD, stent 1988, CABG 10/26/2015.  Post CABG anemia requiring 2 PRBCs. Post CABG tracheostomy for management of vent support and wean. Discharged to [facility] for management of severe deconditioning, discharged to home 12/15/15.  Was on tube feeds as well as dysphagia 2 diet at discharge in 11/1 05/2015.  01/09/16-01/12/16 [facility] admission with heart failure rapid A fib, improved with diuresis and coreg increase for rate control. . Weight 191# at discharge with plans for possible future cardioversion.   08/2000 colonoscopy.  Tortuous, spastic colon.  Two 3 - 4 mm rectal polyps removed. Path: hyperplastic polyp mild active, non-specific proctocolitis 09/2011 colonoscopy.  Mild sigmoid diverticulosis, small internal hemorrhoids. No recurrent polyps. 10/30/201 7 CT abdomen pelvis without contrast:  No biliary or GI pathology.  Starting about 3 days ago he started having anorexia, fatigue/weakness. 2 days ago he had a couple of episodes where he fell, once onto a couch and 1 where, with the help of his brother, he was guided to the floor so no trauma. He fell because he got very dizzy and weak but he did not have any LOC or syncope.  He did not experience any chest pain, shortness of breath, perceivable tachycardia at the time. Yesterday morning, he had a single large diarrheal stool . Did not see that it was dark or bloody.  No heartburn, no abdominal pain. This compares to  daily, formed, brown stools.  The admission notes patient experiencing nausea but he adamantly denies this. What happened was that while eating Cheerios with some peaches, he regurgitated a piece of pH which she then swallowed. He does not describe dysphagia at that time nor any nausea.  Because of ongoing weakness, the patient came to the ED.   Hgb 7.6, was 9.2 -9.4 one month ago.  MCV normal.  PT/INR 32.7/3.1.  AKI: 98/3.2 compared with 58/1.8 three weeks ago. Chest x-ray shows pulmonary vascular congestion.  Patient only had 1 large, loose diarrheal stool yesterday morning. This morning he had a soft, brown stool. He has completed 1 PRBC and a second PRBC is just starting.  Patient has had some  dry, small amounts of blood when he blows his nose in the morning no epistaxis. No large bruises. No excessive or out of the ordinary bleeding.  No hematuria, no frequency or oliguria.  No PPI at home.  Social history notable for his wife being under hospice care at a facility due to advancing dementia. She entered the facility at the first of the year, just when he had been admitted to the hospital. Patient is living at home and his brother lives with him.  Last week his brother was treated for influenza via [facility]. The patient himself did not receive any Tamiflu prophylaxis. No smoking or tobacco use, no alcohol.    Past Medical History:  Diagnosis Date  . Allergy   . Back pain   . Chronic diastolic CHF (congestive heart failure) ([place]) 12/21/2015   A. Echo 10/17: Moderate LVH, EF 55-60, normal wall motion, grade 2 diastolic dysfunction, mild aortic stenosis (mean 14, peak 34), MAC, mild LAE    . CKD (chronic kidney disease) stage 3, GFR 30-59 ml/min 03/04/2014  . Coronary artery disease    a. s/p remote POBA in 1988 // b. NSTEMI in 10/17 >> LHC with 99% LM stenosis >> emergent CABG (L-LAD, S-OM2, S-PDA) - post op course complicated (prolonged ventilation, s/p Trach, AFib, anemia req  transfusion, bacteremia >> DC to LTAC)  . Diabetes mellitus   . Difficult intubation    difficult airway/FYI note 11/01/2015  . GERD (gastroesophageal reflux disease)   . History of nuclear stress test    a. Myoview 6/14 - EF 51, no ischemia or scar; Low Risk  . Hyperlipemia 09/11/2013   Last Assessment & Plan:  Lipid abnormalities are improving with treatment. Nutritional counseling was provided. and Pharmacotherapy as ordered. Lipids will be reassessed in 3 months.  . Hypertension   . Neuropathy ([place])   . On home oxygen therapy    "2L; 24/7" (02/09/2016)  . Persistent atrial fibrillation ([place]) 10/25/2015    Past Surgical History:  Procedure Laterality Date  . CARDIAC CATHETERIZATION N/A 10/26/2015   Procedure: Left Heart Cath and Coronary Angiography;  Surgeon: Troy Sine, MD;  Location: [facility];  Service: Cardiovascular;  Laterality: N/A;  . CATARACT EXTRACTION W/ INTRAOCULAR LENS  IMPLANT, BILATERAL  2008  . COLONOSCOPY    . CORONARY ANGIOPLASTY WITH STENT PLACEMENT  1988  . CORONARY ARTERY BYPASS GRAFT N/A 10/26/2015   Procedure: CORONARY ARTERY BYPASS GRAFTING (CABG) x3(LIMA to LA, SVG to OM1, SVG to PDA) with EVH from the left thigh and partial lower leg greater saphenous vein and left internal mammary artery;  Surgeon: Ivin Poot, MD;  Location: [facility];  Service: Open Heart Surgery;  Laterality: N/A;  . TEE WITHOUT CARDIOVERSION N/A 10/26/2015   Procedure: TRANSESOPHAGEAL ECHOCARDIOGRAM (TEE);  Surgeon: Ivin Poot, MD;  Location: [facility];  Service: Open Heart Surgery;  Laterality: N/A;  . TONSILLECTOMY      Prior to Admission medications   Medication Sig Start Date End Date Taking? Authorizing Provider  amiodarone (PACERONE) 200 MG tablet Take 1 tablet (200 mg total) by mouth as directed. 1 tab twice daily for 2 weeks; then decrease to 1 tab once a day Patient taking differently: Take 200 mg by mouth daily.  01/17/16  Yes Scott T Kathlen Mody, PA-C  amLODipine  (NORVASC) 10 MG tablet Take 0.5 tablets (5 mg total) by mouth daily. 01/12/16  Yes Cheryln Manly, NP  aspirin EC 81 MG tablet Take  81 mg by mouth daily.    Yes Historical Provider, MD  atorvastatin (LIPITOR) 80 MG tablet Take 1 tablet (80 mg total) by mouth daily. 12/21/15 03/20/16 Yes Scott T Weaver, PA-C  carvedilol (COREG) 3.125 MG tablet Take 2 tablets (6.25 mg total) by mouth 2 (two) times daily. Patient taking differently: Take 3.125 mg by mouth 2 (two) times daily.  01/12/16 04/11/16 Yes Cheryln Manly, NP  cholecalciferol (VITAMIN D) 1000 units tablet Take 1,000 Units by mouth daily.   Yes Historical Provider, MD  digoxin (LANOXIN) 0.125 MG tablet Take 125 mcg by mouth daily. 12/15/15  Yes Historical Provider, MD  diltiazem (CARDIZEM) 30 MG tablet Take 30 mg by mouth 2 (two) times daily. 12/15/15  Yes Historical Provider, MD  diphenhydramine-acetaminophen (TYLENOL PM) 25-500 MG TABS tablet Take 2 tablets by mouth at bedtime as needed (for sleep).    Yes Historical Provider, MD  fenofibrate 54 MG tablet Take 54 mg by mouth daily.   Yes Historical Provider, MD  furosemide (LASIX) 40 MG tablet Take 0.5 tablets (20 mg total) by mouth daily. Patient taking differently: Take 40 mg by mouth daily.  01/18/16 04/17/16 Yes Scott T Weaver, PA-C  Glucosamine-Chondroit-Vit C-Mn (GLUCOSAMINE CHONDR 1500 COMPLX PO) Take 2 tablets by mouth daily.    Yes Historical Provider, MD  hydrALAZINE (APRESOLINE) 50 MG tablet Take 75 mg by mouth 3 (three) times daily.   Yes Historical Provider, MD  isosorbide mononitrate (IMDUR) 30 MG 24 hr tablet Take 1 tablet (30 mg total) by mouth daily. 10/20/15 02/09/16 Yes Isaiah Serge, NP  levocetirizine (XYZAL) 5 MG tablet Take 0.5 tablets (2.5 mg total) by mouth every evening. 08/25/15  Yes Adelina Mings, MD  losartan (COZAAR) 100 MG tablet Take 1 tablet (100 mg total) by mouth daily. 12/24/14  Yes Josue Hector, MD  metoprolol tartrate (LOPRESSOR) 25 MG tablet Take 25 mg  by mouth 2 (two) times daily. 12/16/15  Yes Historical Provider, MD  TOUJEO SOLOSTAR 300 UNIT/ML SOPN Inject 30 Units into the skin every morning.  08/15/14  Yes Historical Provider, MD  traZODone (DESYREL) 100 MG tablet Take 100 mg by mouth at bedtime.    Yes Historical Provider, MD  warfarin (COUMADIN) 1 MG tablet Take 4 tablets (4 mg total) by mouth daily at 6 PM. Patient taking differently: Take 2-3 mg by mouth See admin instructions. Take 3 mg by mouth on Sunday and Thursday. Take 2 mg by mouth on all other days 01/12/16  Yes Cheryln Manly, NP  budesonide (PULMICORT) 0.5 MG/2ML nebulizer solution Take 0.5 mg by nebulization daily.  12/20/15 12/19/16  Historical Provider, MD    Scheduled Meds: . sodium chloride   Intravenous STAT  . sodium chloride   Intravenous Once  . sodium chloride   Intravenous Once  . amiodarone  200 mg Oral Daily  . atorvastatin  80 mg Oral q1800  . [START ON 02/11/2016] digoxin  125 mcg Oral Daily  . feeding supplement  1 Container Oral TID BM  . feeding supplement (ENSURE ENLIVE)  237 mL Oral BID BM  . fenofibrate  54 mg Oral Daily  . guaiFENesin  600 mg Oral BID  . insulin aspart  0-5 Units Subcutaneous QHS  . insulin aspart  0-9 Units Subcutaneous TID WC  . isosorbide mononitrate  30 mg Oral Daily  . loratadine  10 mg Oral Daily  . [START ON 02/13/2016] pantoprazole  40 mg Intravenous Q12H  . phytonadione (VITAMIN K)  IV  5 mg Intravenous Once  . traZODone  100 mg Oral QHS   Infusions: . pantoprozole (PROTONIX) infusion 8 mg/hr (02/09/16 2250)   PRN Meds: acetaminophen **OR** acetaminophen, albuterol, diphenhydrAMINE, ipratropium-albuterol, ondansetron **OR** ondansetron (ZOFRAN) IV   Allergies as of 02/09/2016 - Review Complete 02/09/2016  Allergen Reaction Noted  . Amoxicillin Other (See Comments) 09/05/2011  . Cephalexin Other (See Comments) 09/05/2011  . Penicillin g Other (See Comments) 03/08/2015    Family History  Problem Relation Age of  Onset  . Diabetes Father   . Colon cancer Neg Hx   . Esophageal cancer Neg Hx   . Stomach cancer Neg Hx   . Rectal cancer Neg Hx   . Allergic rhinitis Neg Hx   . Angioedema Neg Hx   . Asthma Neg Hx   . Eczema Neg Hx   . Immunodeficiency Neg Hx   . Urticaria Neg Hx     Social History   Social History  . Marital status: Married    Spouse name: N/A  . Number of children: N/A  . Years of education: N/A   Occupational History  . retired    Social History Main Topics  . Smoking status: Former Smoker    Packs/day: 2.00    Years: 30.00    Types: Cigarettes    Quit date: 06/28/2004  . Smokeless tobacco: Never Used  . Alcohol use No     Comment: QUIT 06/28/2004  . Drug use: No  . Sexual activity: Yes   Other Topics Concern  . Not on file   Social History Narrative  . No narrative on file    REVIEW OF SYSTEMS: Constitutional:  Per HPI ENT:  No nose bleeds Pulm:   Stable DOE CV:  No palpitations, no LE edema.  no PND/orthopnea. GU:  No hematuria, no frequency GI:  Peer HPI.  Patient has been eating/drinking all textures of food Heme:  Per HPI   Transfusions:   2 PRBCs during his admission October -November 2017 Neuro:  No headaches, no peripheral tingling or numbness Derm:  No itching, no rash or sores.  Endocrine:  No sweats or chills.  No polyuria or dysuria Immunization:  Flu shot is current. Travel:  None beyond local counties in last few months.    PHYSICAL EXAM: Vital signs in last 24 hours: Vitals:   02/10/16 0513 02/10/16 0833  BP: (!) 93/59 99/61  Pulse: 64 87  Resp: 16 18  Temp: 98.2 F (36.8 C) 98.1 F (36.7 C)   Wt Readings from Last 3 Encounters:  02/10/16 85.6 kg (188 lb 11.2 oz)  01/17/16 87.9 kg (193 lb 12.8 oz)  01/12/16 86.7 kg (191 lb 1.6 oz)    General: Pleasant, frail-appearing elderly WF. He is comfortable. Head:   No signs of head trauma.  Eyes:   No scleral icterus, no conjunctival pallor Ears:   Slightly HOH  Nose:   No  discharge or congestion Mouth:   Tongue midline. Oral mucosa pink, moist and clear. Neck:   No JVD, no masses. No thyromegaly. Lungs:   Diminished breath sounds but no adventitious sounds. Slight dry cough, no dyspnea with speaking or at rest Heart:  irregularly irregular. Rate not accelerated. S1, S2 audible. No MRG. Abdomen:   Soft. Not tender or distended. Bowel sounds active. No HSM, hernias, bruits. GU:  No scrotal edema  Rectal:  deferred rectal exam. Nerves described his stool as brown, soft.   Musc/Skeltl:  no joint erythema, gross  contracture deformities or gross swelling. Extremities:   No CCE.  Neurologic:   Patient is alert. Oriented times 3. Moves all 4 limbs, strength was not tested. No tremor or asterixis. Cranial nerves intact. Skin:   No telangiectasia, sores or suspicious lesions. Tattoos:   None observed Nodes:   No cervical or inguinal adenopathy.   Psych:   Pleasant, got sad and just a bit tearful while discussing the situation with his wife.  Cooperative.  Intake/Output from previous day: 01/31 0701 - 02/01 0700 In: 3500.4 [P.O.:360; I.V.:1470.4; Blood:670; IV Piggyback:1000] Out: 1350 [Urine:1350] Intake/Output this shift: Total I/O In: 702 [P.O.:702] Out: 200 [Urine:200]  LAB RESULTS:  Recent Labs  02/09/16 1341 02/09/16 2216  WBC 6.8  --   HGB 7.6* 8.0*  HCT 24.1* 24.9*  PLT 269  --    BMET Lab Results  Component Value Date   NA 133 (L) 02/09/2016   NA 140 01/17/2016   NA 139 01/12/2016   K 4.9 02/09/2016   K 5.1 01/17/2016   K 4.1 01/12/2016   CL 102 02/09/2016   CL 98 01/17/2016   CL 99 (L) 01/12/2016   CO2 22 02/09/2016   CO2 24 01/17/2016   CO2 32 01/12/2016   GLUCOSE 127 (H) 02/09/2016   GLUCOSE 83 01/17/2016   GLUCOSE 103 (H) 01/12/2016   BUN 98 (H) 02/09/2016   BUN 58 (H) 01/17/2016   BUN 33 (H) 01/12/2016   CREATININE 3.23 (H) 02/09/2016   CREATININE 1.87 (H) 01/17/2016   CREATININE 1.56 (H) 01/12/2016   CALCIUM 8.7 (L)  02/09/2016   CALCIUM 9.8 01/17/2016   CALCIUM 8.7 (L) 01/12/2016   LFT  Recent Labs  02/09/16 1341  PROT 7.0  ALBUMIN 3.5  AST 33  ALT 19  ALKPHOS 33*  BILITOT 0.1*   PT/INR Lab Results  Component Value Date   INR 3.03 02/10/2016   INR 3.11 02/09/2016   INR 3.5 02/04/2016   Hepatitis Panel No results for input(s): HEPBSAG, HCVAB, HEPAIGM, HEPBIGM in the last 72 hours. C-Diff No components found for: CDIFF Lipase     Component Value Date/Time   LIPASE 30 02/09/2016 1341    Drugs of Abuse  No results found for: LABOPIA, COCAINSCRNUR, LABBENZ, AMPHETMU, THCU, LABBARB   RADIOLOGY STUDIES: Dg Chest 2 View  Result Date: 02/09/2016 CLINICAL DATA:  Cough.  Fatigue and falls. EXAM: CHEST  2 VIEW COMPARISON:  01/09/2016 FINDINGS: Stable postsurgical changes from CABG. Cardiomediastinal silhouette is enlarged. Mediastinal contours appear intact. There is no evidence of focal airspace consolidation, pleural effusion or pneumothorax. Mild pulmonary vascular congestion. Osseous structures are without acute abnormality. Soft tissues are grossly normal. IMPRESSION: Enlarged cardiac silhouette with mild pulmonary vascular congestion. Electronically Signed   By: Fidela Salisbury M.D.   On: 02/09/2016 14:09     IMPRESSION:   *  Normocytic anemia.  FOBT +.  Non-bloody emesis and loose stools.  No PPI prophylaxis at home. Suspect AVMs versus acid peptic disorder/ulcer.  By the end of today will have been transfused 2 PRBCs.  *  Atrial fibrillation, on chronic Coumadin. Slightly supratherapeutic INR.  Given 5 mg IV vitamin K x 1.   *  AKI.    *  Close exposure to brother who was treated last week for influenza. Patient is not having symptoms consistent with flu, x-ray doesn't support diagnosis of CAP. He is on prophylactic droplet precautions.  *  Status post CABG x 3 vsl 10/2015.  Complicated postoperative course due  to respiratory failure and deconditioning. Required temporary  tracheostomy.  Has recovered from all of this.  *  Previous hyperplastic colon polyps, nonspecific proctitis and mild sigmoid diverticulosis on previous colonoscopies 2008 and 2013.    PLAN:     *  Pursue EGD once INR corrected. Perhaps could pursue this as soon as tomorrow.  Explained the procedure and risks to the patient and he is agreeable to pursue EGD on the time comes.   *  Patient needs his CODE STATUS changed from full code to DO NOT RESUSCITATE. Due to hospice involvement for his wife, patient is well aware of his end of life decisions and does not wish for any resuscitation.  *  Allow diet. Also he doesn't need a PPI drip, so switched him to daily oral Protonix.   Azucena Freed  02/10/2016, 10:33 AM Pager: [phone]     Attending physician's note   I have taken a history, examined the patient and reviewed the chart. I agree with the Advanced Practitioner's note, impression and recommendations. Normocytic anemia and heme + stool without overt GI bleeding noted. R/O ulcer, gastritis, AVMs, etc. Tentatively have scheduled EGD for 0930 tomorrow if INR < 2.0. Vit K given and Coumadin on hold. Agree PPI and with transfusions to maintain Hb > 8.  [name], MD [facility] [phone] Mon-Fri 8a-5p [phone] after 5p, weekends, holidays

## 2016-02-11 NOTE — Transfer of Care (Signed)
Immediate Anesthesia Transfer of Care Note  Patient: Atilano Covelli  Procedure(s) Performed: Procedure(s): ESOPHAGOGASTRODUODENOSCOPY (EGD) WITH PROPOFOL (N/A)  Patient Location: PACU and Endoscopy Unit  Anesthesia Type:MAC  Level of Consciousness: awake  Airway & Oxygen Therapy: Patient Spontanous Breathing and Patient connected to nasal cannula oxygen  Post-op Assessment: Report given to RN and Post -op Vital signs reviewed and stable  Post vital signs: Reviewed and stable  Last Vitals:  Vitals:   02/11/16 1023 02/11/16 1028  BP: 115/66 (!) 114/45  Pulse: 94 86  Resp: 18 (!) 25  Temp:      Last Pain:  Vitals:   02/11/16 1018  TempSrc: Oral         Complications: No apparent anesthesia complications

## 2016-02-11 NOTE — Anesthesia Postprocedure Evaluation (Signed)
Anesthesia Post Note  Patient: Mark Rivers  Procedure(s) Performed: Procedure(s) (LRB): ESOPHAGOGASTRODUODENOSCOPY (EGD) WITH PROPOFOL (N/A)  Patient location during evaluation: PACU Anesthesia Type: MAC Level of consciousness: awake Pain management: pain level controlled Respiratory status: spontaneous breathing Cardiovascular status: stable Anesthetic complications: no       Last Vitals:  Vitals:   02/11/16 1023 02/11/16 1028  BP: 115/66 (!) 114/45  Pulse: 94 86  Resp: 18 (!) 25  Temp:      Last Pain:  Vitals:   02/11/16 1018  TempSrc: Oral                 [name]

## 2016-02-11 NOTE — Anesthesia Preprocedure Evaluation (Addendum)
Anesthesia Evaluation  Patient identified by MRN, date of birth, ID band Patient awake    Reviewed: Allergy & Precautions, NPO status , Patient's Chart, lab work & pertinent test results  History of Anesthesia Complications (+) DIFFICULT AIRWAY  Airway Mallampati: II  TM Distance: >3 FB     Dental  (+) Teeth Intact   Pulmonary COPD, former smoker,    breath sounds clear to auscultation       Cardiovascular hypertension, + CAD and +CHF   Rhythm:Regular Rate:Normal     Neuro/Psych    GI/Hepatic Neg liver ROS, GERD  ,  Endo/Other  diabetes  Renal/GU Renal disease     Musculoskeletal   Abdominal   Peds  Hematology   Anesthesia Other Findings   Reproductive/Obstetrics                            Anesthesia Physical Anesthesia Plan  ASA: III  Anesthesia Plan: MAC   Post-op Pain Management:    Induction: Intravenous  Airway Management Planned:   Additional Equipment:   Intra-op Plan:   Post-operative Plan:   Informed Consent: I have reviewed the patients History and Physical, chart, labs and discussed the procedure including the risks, benefits and alternatives for the proposed anesthesia with the patient or authorized representative who has indicated his/her understanding and acceptance.   Dental advisory given  Plan Discussed with: CRNA, Anesthesiologist and Surgeon  Anesthesia Plan Comments:        Anesthesia Quick Evaluation

## 2016-02-12 DIAGNOSIS — I5032 Chronic diastolic (congestive) heart failure: Secondary | ICD-10-CM

## 2016-02-12 DIAGNOSIS — K922 Gastrointestinal hemorrhage, unspecified: Secondary | ICD-10-CM

## 2016-02-12 DIAGNOSIS — N170 Acute kidney failure with tubular necrosis: Secondary | ICD-10-CM

## 2016-02-12 DIAGNOSIS — J431 Panlobular emphysema: Secondary | ICD-10-CM

## 2016-02-12 DIAGNOSIS — I482 Chronic atrial fibrillation: Secondary | ICD-10-CM

## 2016-02-12 DIAGNOSIS — J101 Influenza due to other identified influenza virus with other respiratory manifestations: Secondary | ICD-10-CM

## 2016-02-12 DIAGNOSIS — N183 Chronic kidney disease, stage 3 (moderate): Secondary | ICD-10-CM

## 2016-02-12 DIAGNOSIS — K2980 Duodenitis without bleeding: Secondary | ICD-10-CM

## 2016-02-12 LAB — GLUCOSE, CAPILLARY
GLUCOSE-CAPILLARY: 92 mg/dL (ref 65–99)
GLUCOSE-CAPILLARY: 132 mg/dL — AB (ref 65–99)
GLUCOSE-CAPILLARY: 112 mg/dL — AB (ref 65–99)
GLUCOSE-CAPILLARY: 132 mg/dL — AB (ref 65–99)
GLUCOSE-CAPILLARY: 98 mg/dL (ref 65–99)

## 2016-02-12 LAB — BASIC METABOLIC PANEL
Anion gap: 7 (ref 5–15)
BUN: 37 mg/dL — AB (ref 6–20)
CO2: 25 mmol/L (ref 22–32)
CREATININE: 1.52 mg/dL — AB (ref 0.61–1.24)
Calcium: 8.5 mg/dL — ABNORMAL LOW (ref 8.9–10.3)
Chloride: 108 mmol/L (ref 101–111)
GFR, EST AFRICAN AMERICAN: 48 mL/min — AB (ref >60)
GFR, EST NON AFRICAN AMERICAN: 42 mL/min — AB (ref >60)
Glucose, Bld: 102 mg/dL — ABNORMAL HIGH (ref 65–99)
Potassium: 4.6 mmol/L (ref 3.5–5.1)
SODIUM: 140 mmol/L (ref 135–145)

## 2016-02-12 LAB — CBC
HCT: 30.2 % — ABNORMAL LOW (ref 39.0–52.0)
Hemoglobin: 9.6 g/dL — ABNORMAL LOW (ref 13.0–17.0)
MCH: 29.4 pg (ref 26.0–34.0)
MCHC: 31.8 g/dL (ref 30.0–36.0)
MCV: 92.4 fL (ref 78.0–100.0)
PLATELETS: 200 10*3/uL (ref 150–400)
RBC: 3.27 MIL/uL — ABNORMAL LOW (ref 4.22–5.81)
RDW: 17.4 % — AB (ref 11.5–15.5)
WBC: 6.1 10*3/uL (ref 4.0–10.5)

## 2016-02-12 NOTE — Progress Notes (Signed)
PROGRESS NOTE    Mark Rivers  [id number] DOB: 12-Jun-1934 DOA: 02/09/2016 PCP: [name]., MD     Brief Narrative:  82 y.o.WM PMHx HTN, CAD, Chronic Respiratory Failure(oxygen independent 2 L [place] O2,): Chronic Diastolic CHF last EF 0000000 10/2015, COPD,  A-Fib on Coumadin, DM type II, and CKD;   Who presents with complaints of generalized weakness over the last 2-3 days. Found to have drop inHb to 7.6 from 9.5 and hemoccult positive, INR 3.1 Given 1unit PRBC    Subjective: 2/3 A/O 4, positive SOB but improved since admission. Patient has not ambulated since admission. States lives at home and normally able to walk up and down stairs ambulate around house without assistance.     Assessment & Plan:   Principal Problem:   GI bleed Active Problems:   Diabetes mellitus with stage 3 chronic kidney disease (HCC)   Persistent atrial fibrillation (HCC)   Chronic diastolic CHF (congestive heart failure) (HCC)   Symptomatic anemia   Acute renal failure superimposed on chronic kidney disease (HCC)   Malnutrition of moderate degree   Influenza A   Duodenitis:Per EGD 02/11/2016   Warfarin-induced coagulopathy ([place])   Upper GI bleed secondary to duodenitis and bleeding angiodysplasic lesion on greater curvature gastric body Patient was admitted with generalized weakness, fatigue, dizziness. Patient denied any syncope or loss of consciousness. exam with the initial hemoglobin 7.6 previously 9.4 just 3 weeks ago. -s/p 1unit PRBC - hemoglobin currently stable at 9.2. - Patient status post upper endoscopy 02/11/2016 with single bleeding angiodysplastic lesion in the stomach status: Post argon plasma coagulation, single mucosal papule found in the stomach, erosive duodenitis. -Patient has been transitioned to oral Protonix. Patient to avoid NSAID, aspirin, ibuprofen, anti-inflammatory drugs for at least 2 weeks and needs to be on a PPI Protonix 40 mg daily indefinitely. The GI  okay to resume patient's Coumadin tomorrow at prior home dose.  Influenza A H3 -The respiratory viral panel which was positive for influenza A H3.  -Complete course of Tamiflu. Supportive care.  Hypotension: -due to above and dehydration/poor Po intake recently -Resolved   Acute on CKD stage 3(baseline Cr 1.5-1.8) -due to hypotension, poor Po and ARB -holding ARB, status post gentle hydration. Creatinine now at 1.68 from 2.25 from 3.2. -Renal ultrasound negative for hydronephrosis.  Atrial fibrillation: Chronic.  -Patient currently rate controlled at this time - Continue amiodarone, digoxin, Coreg. -continue dig dosing, level low but will not increase dose due to AKI - Per gastroenterology okay to resume Coumadin at home dose tomorrow 02/12/2016.  Chronic Diastolic CHF -now dry, holding diuretics, s/p blood and IVF -resume diuretics in 1-2days when able  Essential hypertension - holdingamlodipine, diltiazem, lasix, losartan, hydralazine due to hypotension -restart medications when Bp improved -coreg with holding parameters  COPD/emphysema - stable, Continue budesonide  - Albuterol PRN  Dyslipidemia - continue fenofibrate   Diabetes mellitus type 2 controlled with complication -Hemoglobin A1c was 5.7 on 11/27/2015.  - Held home [place]  - CBG have ranged from 84 -131. Continue sliding scale insulin.  CAD: s/p PTCA to RCA in 1988 and CABG. -resumed Coreg, holding ASA and imdur    DVT prophylaxis: SCD Code Status: DO NOT RESUSCITATE Family Communication: None Disposition Plan: Home vs SNF   Consultants:   Gastroenterology: Dr. Fuller Plan 02/10/2016  Procedures/Significant Events:   Upper endoscopy 02/11/2016 per Dr. Fuller Plan  Chest x-ray 02/09/2016  Renal ultrasound 02/10/2016  1 unit packed red blood cells.    VENTILATOR SETTINGS:  Cultures   Antimicrobials: Anti-infectives    Start     Stop   02/11/16 1230  oseltamivir (TAMIFLU)  capsule 30 mg     02/16/16 [id number]       Devices    LINES / TUBES:      Continuous Infusions:   Objective: Vitals:   02/11/16 1652 02/11/16 2013 02/12/16 0539 02/12/16 0551  BP: (!) 117/94 (!) 121/59 115/68 115/68  Pulse: 68 68 67 67  Resp: 19 19 18 18   Temp:  97.9 F (36.6 C) 97.7 F (36.5 C) 97.7 F (36.5 C)  TempSrc:  Oral Oral Oral  SpO2: 96% 98% 97% 97%  Weight:   83.4 kg (183 lb 14.4 oz) 83.4 kg (183 lb 14.4 oz)  Height:        Intake/Output Summary (Last 24 hours) at 02/12/16 [id number] Last data filed at 02/12/16 0756  Gross per 24 hour  Intake              840 ml  Output             1250 ml  Net             -410 ml   Filed Weights   02/11/16 0548 02/12/16 0539 02/12/16 0551  Weight: 85.4 kg (188 lb 3.2 oz) 83.4 kg (183 lb 14.4 oz) 83.4 kg (183 lb 14.4 oz)    Examination:  General: A/O 4, NAD, positive acute on chronic respiratory distress Eyes: negative scleral hemorrhage, negative anisocoria, negative icterus ENT: Negative Runny nose, negative gingival bleeding, Neck:  Negative scars, masses, torticollis, lymphadenopathy, JVD Lungs: mild expiratory wheezing, negative crackles Cardiovascular: Regular rate and rhythm without murmur gallop or rub normal S1 and S2 Abdomen: negative abdominal pain, nondistended, positive soft, bowel sounds, no rebound, no ascites, no appreciable mass Extremities: No significant cyanosis, clubbing, or edema bilateral lower extremities Skin: Negative rashes, lesions, ulcers Psychiatric:  Negative depression, negative anxiety, negative fatigue, negative mania  Central nervous system:  Cranial nerves II through XII intact, tongue/uvula midline, all extremities muscle strength 5/5, sensation intact throughout, negative dysarthria, negative expressive aphasia, negative receptive aphasia.  .     Data Reviewed: Care during the described time interval was provided by me .  I have reviewed this patient's available data, including  medical history, events of note, physical examination, and all test results as part of my evaluation. I have personally reviewed and interpreted all radiology studies.  CBC:  Recent Labs Lab 02/09/16 1341 02/09/16 2216 02/10/16 0939 02/11/16 0547 02/12/16 0312  WBC 6.8  --  5.4 5.4 6.1  NEUTROABS 4.5  --   --   --   --   HGB 7.6* 8.0* 9.1* 9.2* 9.6*  HCT 24.1* 24.9* 28.5* 29.3* 30.2*  MCV 93.1  --  89.9 90.7 92.4  PLT 269  --  209 209 [id number]   Basic Metabolic Panel:  Recent Labs Lab 02/09/16 1341 02/10/16 0939 02/11/16 1118 02/12/16 0312  NA 133* 138 139 140  K 4.9 4.2 4.6 4.6  CL 102 110 108 108  CO2 22 22 26 25   GLUCOSE 127* 158* 92 102*  BUN 98* 71* 45* 37*  CREATININE 3.23* 2.25* 1.68* 1.52*  CALCIUM 8.7* 8.4* 8.6* 8.5*   GFR: Estimated Creatinine Clearance: 40 mL/min (by C-G formula based on SCr of 1.52 mg/dL (H)). Liver Function Tests:  Recent Labs Lab 02/09/16 1341  AST 33  ALT 19  ALKPHOS 33*  BILITOT 0.1*  PROT 7.0  ALBUMIN  3.5    Recent Labs Lab 02/09/16 1341  LIPASE 30   No results for input(s): AMMONIA in the last 168 hours. Coagulation Profile:  Recent Labs Lab 02/09/16 1341 02/10/16 0939 02/11/16 0636  INR 3.11 3.03 1.32   Cardiac Enzymes: No results for input(s): CKTOTAL, CKMB, CKMBINDEX, TROPONINI in the last 168 hours. BNP (last 3 results) No results for input(s): PROBNP in the last 8760 hours. HbA1C: No results for input(s): HGBA1C in the last 72 hours. CBG:  Recent Labs Lab 02/11/16 0717 02/11/16 1138 02/11/16 1719 02/11/16 2118 02/12/16 0716  GLUCAP 92 84 131* 127* 92   Lipid Profile: No results for input(s): CHOL, HDL, LDLCALC, TRIG, CHOLHDL, LDLDIRECT in the last 72 hours. Thyroid Function Tests: No results for input(s): TSH, T4TOTAL, FREET4, T3FREE, THYROIDAB in the last 72 hours. Anemia Panel: No results for input(s): VITAMINB12, FOLATE, FERRITIN, TIBC, IRON, RETICCTPCT in the last 72 hours. Sepsis  Labs:  Recent Labs Lab 02/09/16 1356  LATICACIDVEN 1.04    Recent Results (from the past 240 hour(s))  Respiratory Panel by PCR     Status: Abnormal   Collection Time: 02/10/16  6:09 AM  Result Value Ref Range Status   Adenovirus NOT DETECTED NOT DETECTED Final   Coronavirus 229E NOT DETECTED NOT DETECTED Final   Coronavirus HKU1 NOT DETECTED NOT DETECTED Final   Coronavirus NL63 NOT DETECTED NOT DETECTED Final   Coronavirus OC43 NOT DETECTED NOT DETECTED Final   Metapneumovirus NOT DETECTED NOT DETECTED Final   Rhinovirus / Enterovirus NOT DETECTED NOT DETECTED Final   Influenza A H3 DETECTED (A) NOT DETECTED Final   Influenza B NOT DETECTED NOT DETECTED Final   Parainfluenza Virus 1 NOT DETECTED NOT DETECTED Final   Parainfluenza Virus 2 NOT DETECTED NOT DETECTED Final   Parainfluenza Virus 3 NOT DETECTED NOT DETECTED Final   Parainfluenza Virus 4 NOT DETECTED NOT DETECTED Final   Respiratory Syncytial Virus NOT DETECTED NOT DETECTED Final   Bordetella pertussis NOT DETECTED NOT DETECTED Final   Chlamydophila pneumoniae NOT DETECTED NOT DETECTED Final   Mycoplasma pneumoniae NOT DETECTED NOT DETECTED Final         Radiology Studies: US Renal  Result Date: 02/10/2016 CLINICAL DATA:  Acute renal failure. History of coronary artery disease, CHF, atrial fibrillation, diabetes, former smoker. EXAM: RENAL / URINARY TRACT ULTRASOUND COMPLETE COMPARISON:  Report of a renal ultrasound dated October 31, 2013 FINDINGS: Right Kidney: Length: 11.0 cm. The renal cortical echotexture is approximately equal to that of the adjacent liver. There is no hydronephrosis nor cystic or solid mass. Left Kidney: Length: 11.7 cm. The renal cortical echotexture is similar to that on the right. There is no hydronephrosis nor discrete mass. Bladder: Appears normal for degree of bladder distention. Bilateral ureteral jets are observed. IMPRESSION: Mildly increased renal cortical echotexture compatible with  medical renal disease. There is no evidence of obstruction or other acute abnormality. Electronically Signed   By: David  Martinique M.D.   On: 02/10/2016 12:29        Scheduled Meds: . amiodarone  200 mg Oral Daily  . atorvastatin  80 mg Oral q1800  . carvedilol  6.25 mg Oral BID WC  . digoxin  125 mcg Oral Daily  . feeding supplement (GLUCERNA SHAKE)  237 mL Oral BID BM  . fenofibrate  54 mg Oral Daily  . guaiFENesin  600 mg Oral BID  . insulin aspart  0-5 Units Subcutaneous QHS  . insulin aspart  0-9 Units Subcutaneous  TID WC  . loratadine  10 mg Oral Daily  . oseltamivir  30 mg Oral BID  . pantoprazole  40 mg Oral Q0600  . traZODone  100 mg Oral QHS   Continuous Infusions:   LOS: 3 days    Time spent:40 min    WOODS, Geraldo Docker, MD [facility] Pager [phone]  If 7PM-7AM, please contact night-coverage [email or web address] Password [facility] 02/12/2016, 8:24 AM

## 2016-02-13 ENCOUNTER — Encounter (HOSPITAL_COMMUNITY): Payer: Self-pay | Admitting: Gastroenterology

## 2016-02-13 DIAGNOSIS — K2980 Duodenitis without bleeding: Secondary | ICD-10-CM

## 2016-02-13 DIAGNOSIS — J431 Panlobular emphysema: Secondary | ICD-10-CM

## 2016-02-13 DIAGNOSIS — E118 Type 2 diabetes mellitus with unspecified complications: Secondary | ICD-10-CM

## 2016-02-13 DIAGNOSIS — K922 Gastrointestinal hemorrhage, unspecified: Secondary | ICD-10-CM

## 2016-02-13 DIAGNOSIS — N183 Chronic kidney disease, stage 3 (moderate): Secondary | ICD-10-CM

## 2016-02-13 DIAGNOSIS — I10 Essential (primary) hypertension: Secondary | ICD-10-CM

## 2016-02-13 DIAGNOSIS — N170 Acute kidney failure with tubular necrosis: Secondary | ICD-10-CM

## 2016-02-13 DIAGNOSIS — N179 Acute kidney failure, unspecified: Secondary | ICD-10-CM

## 2016-02-13 LAB — GLUCOSE, CAPILLARY
GLUCOSE-CAPILLARY: 98 mg/dL (ref 65–99)
GLUCOSE-CAPILLARY: 143 mg/dL — AB (ref 65–99)

## 2016-02-13 LAB — TYPE AND SCREEN
ABO/RH(D): O POS
Antibody Screen: POSITIVE
DAT, IgG: NEGATIVE
DONOR AG TYPE: NEGATIVE
Donor AG Type: NEGATIVE
PT AG Type: NEGATIVE
Unit division: 0
Unit division: 0

## 2016-02-13 MED ORDER — WARFARIN SODIUM 1 MG PO TABS: 2.0000 mg | ORAL_TABLET | ORAL | 0 refills | Status: DC

## 2016-02-13 MED ORDER — PANTOPRAZOLE SODIUM 40 MG PO TBEC: 40.0000 mg | DELAYED_RELEASE_TABLET | Freq: Every day | ORAL | 0 refills | Status: AC

## 2016-02-13 MED ORDER — AMIODARONE HCL 200 MG PO TABS: 200.0000 mg | ORAL_TABLET | Freq: Every day | ORAL | 0 refills | Status: DC

## 2016-02-13 MED ORDER — OSELTAMIVIR PHOSPHATE 30 MG PO CAPS: 30.0000 mg | ORAL_CAPSULE | Freq: Two times a day (BID) | ORAL | 0 refills | Status: DC

## 2016-02-13 MED ORDER — LORATADINE 10 MG PO TABS: 10.0000 mg | ORAL_TABLET | Freq: Every day | ORAL | 0 refills | Status: DC

## 2016-02-13 NOTE — Discharge Summary (Signed)
Physician Discharge Summary  Mark Rivers [id number] DOB: 04-30-1934 DOA: 02/09/2016  PCP: [name]., MD  Admit date: 02/09/2016 Discharge date: 02/13/2016  Time spent: 35 minutes  Recommendations for Outpatient Follow-up:  Upper GI bleed secondary to duodenitis and bleeding angiodysplasic lesion on greater curvature gastric body - initial hemoglobin 7.6 previously 9.4 just 3 weeks ago. -s/p transfusion 1unit PRBC - Patient status post upper endoscopy 02/11/2016, bleeding lesion. See results below -Avoid NSAID, aspirin, ibuprofen, anti-inflammatory drugs for at least 2 weeks - PPI Protonix 40 mg daily indefinitely. -Restart patient's Coumadin at previous home dose at time of discharge. By the time therapeutic Will have been approximately 7-10 days post last bleeding episode. -Schedule Follow up with Dr. [name] GI to in 2 weeks discuss pathology findings of biopsy. Upper GI bleed.  Influenza AH3 -The respiratory viral panel which was positive for influenza AH3.  -Complete course of Tamiflu. -Schedule Follow-up in 1-2 weeks with Dr. [name] for upper GI bleed, influenza A,  Hypotension: -due to above and dehydration/poor Po intake recently -Resolved   Acute on CKD stage 3(baseline Cr 1.5-1.8) -due to hypotension, poor Po and ARB -holding ARB, status post gentle hydration.  Lab Results  Component Value Date   CREATININE 1.52 (H) 02/12/2016   CREATININE 1.68 (H) 02/11/2016   CREATININE 2.25 (H) 02/10/2016  -Creatinine baseline. --Renal ultrasound negative for hydronephrosis.  Atrial fibrillation: Chronic.  -Patient currently rate controlled at this time - Continue amiodarone, Coreg, and Digoxin.  Chronic Diastolic CHF -now dry, holding diuretics, s/p blood and IVF -Will allow PCP to restart Diuretics if/when deemed appropriate.   Essential hypertension - holdingamlodipine, diltiazem, lasix, losartan, hydralazine: BP currently  controlled  COPD/emphysema - stable, Continue budesonide  - Albuterol PRN -Continue home O2 regimen (oxygen independent 2 L [place] O2,)  Dyslipidemia - continue fenofibrate   Diabetes mellitus type 2 controlled with complication -123456 Hemoglobin A1= 5.7 .  - Restart home medication   CAD: s/p PTCA to RCA in 1988 and CABG. -resumedCoreg, holding ASA and imdur     Discharge Diagnoses:  Principal Problem:   GI bleed Active Problems:   Diabetes mellitus with stage 3 chronic kidney disease (HCC)   Persistent atrial fibrillation (HCC)   Chronic diastolic CHF (congestive heart failure) (HCC)   Symptomatic anemia   Acute renal failure superimposed on chronic kidney disease (HCC)   Malnutrition of moderate degree   Influenza A   Duodenitis:Per EGD 02/11/2016   Warfarin-induced coagulopathy ([place])   Upper GI bleed   Duodenitis determined by biopsy   Acute renal failure with acute tubular necrosis superimposed on stage 3 chronic kidney disease (HCC)   Chronic atrial fibrillation (HCC)   Panlobular emphysema ([place])   Discharge Condition: Stable  Diet recommendation: Heart healthy  Filed Weights   02/12/16 0539 02/12/16 0551 02/13/16 0522  Weight: 83.4 kg (183 lb 14.4 oz) 83.4 kg (183 lb 14.4 oz) 83.8 kg (184 lb 11.2 oz)    History of present illness:  82 y.o.WM PMHx HTN, CAD, Chronic Respiratory Failure(oxygen independent 2 L [place] O2,): Chronic Diastolic CHF last EF 0000000 10/2015, COPD,  A-Fib on Coumadin, DM type II, and CKD;   Who presents with complaints of generalized weakness over the last 2-3 days. Found to have drop inHb to 7.6 from 9.5 and hemoccult positive, INR 3.1 Given 1unit PRBC During his hospitalization patient received EGD which showed single bleeding angiodysplastic lesion in the stomach: S/P argonplasma coagulation, single mucosal papule found in the stomach, erosive  duodenitis.     Procedures: Transfused 1 unit PRBC 2/2 EGD:- Normal  esophagus. - A single bleeding angiodysplastic lesion in the stomach. Treated with argon plasma coagulation (APC). - A single mucosal papule (nodule) found in the stomach. Biopsied.    Consultations: Gastroenterology: Dr. Fuller Plan 02/10/2016  Antibiotics    Discharge Exam: Vitals:   02/12/16 0551 02/12/16 1125 02/12/16 1933 02/13/16 0522  BP: 115/68 108/64 (!) 121/57 (!) 125/57  Pulse: 67 64 63 (!) 55  Resp: 18 18 18 20   Temp: 97.7 F (36.5 C) 98.2 F (36.8 C) 97.8 F (36.6 C) 97.8 F (36.6 C)  TempSrc: Oral Oral Oral Oral  SpO2: 97% 98% 99% 99%  Weight: 83.4 kg (183 lb 14.4 oz)   83.8 kg (184 lb 11.2 oz)  Height:        General: A/O 4, NAD, positive acute on chronic respiratory distress Eyes: negative scleral hemorrhage, negative anisocoria, negative icterus ENT: Negative Runny nose, negative gingival bleeding, Neck:  Negative scars, masses, torticollis, lymphadenopathy, JVD Lungs: mild expiratory wheezing, negative crackles Cardiovascular: Regular rate and rhythm without murmur gallop or rub normal S1 and S2   Discharge Instructions   Allergies as of 02/13/2016      Reactions   Amoxicillin Other (See Comments)   Other reaction(s): Other (See Comments) PATIENT PASSES OUT!!!! Passes out PATIENT PASSES OUT!!!!   Cephalexin Other (See Comments)   PATIENT PASSES OUT!!!! Passes out PATIENT PASSES OUT!!!!   Penicillin G Other (See Comments)   Has patient had a PCN reaction causing immediate rash, facial/tongue/throat swelling, SOB or lightheadedness with hypotension: Yes Has patient had a PCN reaction causing severe rash involving mucus membranes or skin necrosis: No Has patient had a PCN reaction that required hospitalization: Yes Has patient had a PCN reaction occurring within the last 10 years: Yes If all of the above answers are "NO", then may proceed with Cephalosporin use. PATIENT PASSES OUT!!!! Has patient had a PCN reaction causing immediate rash,  facial/tongue/throat swelling, SOB or lightheadedness with hypotension: Yes Has patient had a PCN reaction causing severe rash involving mucus membranes or skin necrosis: No Has patient had a PCN reaction that required hospitalization: Yes Has patient had a PCN reaction occurring within the last 10 years: Yes If all of the above answers are "NO", then may proceed with Cephalosporin use. PATIENT PASSES OUT!!!!      Medication List    STOP taking these medications   amLODipine 10 MG tablet Commonly known as:  NORVASC   aspirin EC 81 MG tablet   diltiazem 30 MG tablet Commonly known as:  CARDIZEM   diphenhydramine-acetaminophen 25-500 MG Tabs tablet Commonly known as:  TYLENOL PM   furosemide 40 MG tablet Commonly known as:  LASIX   hydrALAZINE 50 MG tablet Commonly known as:  APRESOLINE   isosorbide mononitrate 30 MG 24 hr tablet Commonly known as:  IMDUR   levocetirizine 5 MG tablet Commonly known as:  XYZAL   losartan 100 MG tablet Commonly known as:  COZAAR   metoprolol tartrate 25 MG tablet Commonly known as:  LOPRESSOR     TAKE these medications   amiodarone 200 MG tablet Commonly known as:  PACERONE Take 1 tablet (200 mg total) by mouth daily. Start taking on:  02/14/2016   atorvastatin 80 MG tablet Commonly known as:  LIPITOR Take 1 tablet (80 mg total) by mouth daily.   budesonide 0.5 MG/2ML nebulizer solution Commonly known as:  PULMICORT Take 0.5 mg by nebulization  daily.   carvedilol 3.125 MG tablet Commonly known as:  COREG Take 2 tablets (6.25 mg total) by mouth 2 (two) times daily. What changed:  how much to take   cholecalciferol 1000 units tablet Commonly known as:  VITAMIN D Take 1,000 Units by mouth daily.   digoxin 0.125 MG tablet Commonly known as:  LANOXIN Take 125 mcg by mouth daily.   fenofibrate 54 MG tablet Take 54 mg by mouth daily.   GLUCOSAMINE CHONDR 1500 COMPLX PO Take 2 tablets by mouth daily.   loratadine 10 MG  tablet Commonly known as:  CLARITIN Take 1 tablet (10 mg total) by mouth daily. Start taking on:  02/14/2016   oseltamivir 30 MG capsule Commonly known as:  TAMIFLU Take 1 capsule (30 mg total) by mouth 2 (two) times daily.   pantoprazole 40 MG tablet Commonly known as:  PROTONIX Take 1 tablet (40 mg total) by mouth daily at 6 (six) AM. Start taking on:  02/14/2016   TOUJEO SOLOSTAR 300 UNIT/ML Sopn Generic drug:  Insulin Glargine Inject 30 Units into the skin every morning.   traZODone 100 MG tablet Commonly known as:  DESYREL Take 100 mg by mouth at bedtime.   warfarin 1 MG tablet Commonly known as:  COUMADIN Take 2-3 tablets (2-3 mg total) by mouth See admin instructions. Take 3 mg by mouth on Sunday and Thursday. Take 2 mg by mouth on all other days What changed:  how much to take  when to take this  additional instructions      Allergies  Allergen Reactions  . Amoxicillin Other (See Comments)    Other reaction(s): Other (See Comments) PATIENT PASSES OUT!!!! Passes out PATIENT PASSES OUT!!!!  . Cephalexin Other (See Comments)    PATIENT PASSES OUT!!!! Passes out PATIENT PASSES OUT!!!!  . Penicillin G Other (See Comments)    Has patient had a PCN reaction causing immediate rash, facial/tongue/throat swelling, SOB or lightheadedness with hypotension: Yes Has patient had a PCN reaction causing severe rash involving mucus membranes or skin necrosis: No Has patient had a PCN reaction that required hospitalization: Yes Has patient had a PCN reaction occurring within the last 10 years: Yes If all of the above answers are "NO", then may proceed with Cephalosporin use. PATIENT PASSES OUT!!!! Has patient had a PCN reaction causing immediate rash, facial/tongue/throat swelling, SOB or lightheadedness with hypotension: Yes Has patient had a PCN reaction causing severe rash involving mucus membranes or skin necrosis: No Has patient had a PCN reaction that required  hospitalization: Yes Has patient had a PCN reaction occurring within the last 10 years: Yes If all of the above answers are "NO", then may proceed with Cephalosporin use. PATIENT PASSES OUT!!!!   Follow-up Information    [facility] Follow up.   Why:  They will continue to do your home health care at your home Contact information: [place] [address] [phone]        AGUIAR,RAFAELA M., MD Follow up in 2 week(s).   Specialty:  Family Medicine Why:  Schedule Follow-up in 1-2 weeks with Dr. [name] for upper GI bleed, influenza A, Contact information: [id number] [place] [address] [phone]            The results of significant diagnostics from this hospitalization (including imaging, microbiology, ancillary and laboratory) are listed below for reference.    Significant Diagnostic Studies: Dg Chest 2 View  Result Date: 02/09/2016  CLINICAL DATA:  Cough.  Fatigue and falls. EXAM: CHEST  2 VIEW COMPARISON:  01/09/2016 FINDINGS: Stable postsurgical changes from CABG. Cardiomediastinal silhouette is enlarged. Mediastinal contours appear intact. There is no evidence of focal airspace consolidation, pleural effusion or pneumothorax. Mild pulmonary vascular congestion. Osseous structures are without acute abnormality. Soft tissues are grossly normal. IMPRESSION: Enlarged cardiac silhouette with mild pulmonary vascular congestion. Electronically Signed   By: Fidela Salisbury M.D.   On: 02/09/2016 14:09   US Renal  Result Date: 02/10/2016 CLINICAL DATA:  Acute renal failure. History of coronary artery disease, CHF, atrial fibrillation, diabetes, former smoker. EXAM: RENAL / URINARY TRACT ULTRASOUND COMPLETE COMPARISON:  Report of a renal ultrasound dated October 31, 2013 FINDINGS: Right Kidney: Length: 11.0 cm. The renal cortical echotexture is approximately equal to that of the adjacent liver. There is no  hydronephrosis nor cystic or solid mass. Left Kidney: Length: 11.7 cm. The renal cortical echotexture is similar to that on the right. There is no hydronephrosis nor discrete mass. Bladder: Appears normal for degree of bladder distention. Bilateral ureteral jets are observed. IMPRESSION: Mildly increased renal cortical echotexture compatible with medical renal disease. There is no evidence of obstruction or other acute abnormality. Electronically Signed   By: David  Martinique M.D.   On: 02/10/2016 12:29    Microbiology: Recent Results (from the past 240 hour(s))  Respiratory Panel by PCR     Status: Abnormal   Collection Time: 02/10/16  6:09 AM  Result Value Ref Range Status   Adenovirus NOT DETECTED NOT DETECTED Final   Coronavirus 229E NOT DETECTED NOT DETECTED Final   Coronavirus HKU1 NOT DETECTED NOT DETECTED Final   Coronavirus NL63 NOT DETECTED NOT DETECTED Final   Coronavirus OC43 NOT DETECTED NOT DETECTED Final   Metapneumovirus NOT DETECTED NOT DETECTED Final   Rhinovirus / Enterovirus NOT DETECTED NOT DETECTED Final   Influenza A H3 DETECTED (A) NOT DETECTED Final   Influenza B NOT DETECTED NOT DETECTED Final   Parainfluenza Virus 1 NOT DETECTED NOT DETECTED Final   Parainfluenza Virus 2 NOT DETECTED NOT DETECTED Final   Parainfluenza Virus 3 NOT DETECTED NOT DETECTED Final   Parainfluenza Virus 4 NOT DETECTED NOT DETECTED Final   Respiratory Syncytial Virus NOT DETECTED NOT DETECTED Final   Bordetella pertussis NOT DETECTED NOT DETECTED Final   Chlamydophila pneumoniae NOT DETECTED NOT DETECTED Final   Mycoplasma pneumoniae NOT DETECTED NOT DETECTED Final     Labs: Basic Metabolic Panel:  Recent Labs Lab 02/09/16 1341 02/10/16 0939 02/11/16 1118 02/12/16 0312  NA 133* 138 139 140  K 4.9 4.2 4.6 4.6  CL 102 110 108 108  CO2 22 22 26 25   GLUCOSE 127* 158* 92 102*  BUN 98* 71* 45* 37*  CREATININE 3.23* 2.25* 1.68* 1.52*  CALCIUM 8.7* 8.4* 8.6* 8.5*   Liver Function  Tests:  Recent Labs Lab 02/09/16 1341  AST 33  ALT 19  ALKPHOS 33*  BILITOT 0.1*  PROT 7.0  ALBUMIN 3.5    Recent Labs Lab 02/09/16 1341  LIPASE 30   No results for input(s): AMMONIA in the last 168 hours. CBC:  Recent Labs Lab 02/09/16 1341 02/09/16 2216 02/10/16 0939 02/11/16 0547 02/12/16 0312  WBC 6.8  --  5.4 5.4 6.1  NEUTROABS 4.5  --   --   --   --   HGB 7.6* 8.0* 9.1* 9.2* 9.6*  HCT 24.1* 24.9* 28.5* 29.3* 30.2*  MCV 93.1  --  89.9 90.7 92.4  PLT 269  --  209 209 200   Cardiac Enzymes: No results for input(s): CKTOTAL, CKMB, CKMBINDEX, TROPONINI in the last 168 hours. BNP: BNP (last 3 results)  Recent Labs  10/25/15 2110 10/26/15 0137 01/09/16 1445  BNP 207.1* 225.2* 404.3*    ProBNP (last 3 results) No results for input(s): PROBNP in the last 8760 hours.  CBG:  Recent Labs Lab 02/12/16 1624 02/12/16 1641 02/12/16 2111 02/13/16 0741 02/13/16 1138  GLUCAP 112* 132* 98 98 143*       Signed:  Dia Crawford, MD [facility] [phone] pager

## 2016-02-13 NOTE — Progress Notes (Signed)
SATURATION QUALIFICATIONS: (This note is used to comply with regulatory documentation for home oxygen)  Patient Saturations on Room Air at Rest = 98%  Patient Saturations on Room Air while Ambulating = 95%  Patient Saturations on 2 Liters of oxygen while Ambulating = 100%

## 2016-02-13 NOTE — Discharge Summary (Signed)
Pt got discharged, discharge instructions provided and patient showed understanding to it, IV taken out,Telemonitor DC,pt left unit in wheelchair with all of the belongings accompanied with his brother.

## 2016-02-15 ENCOUNTER — Telehealth: Payer: Self-pay | Admitting: Physician Assistant

## 2016-02-16 ENCOUNTER — Ambulatory Visit (INDEPENDENT_AMBULATORY_CARE_PROVIDER_SITE_OTHER): Payer: Medicare Other | Admitting: Physician Assistant

## 2016-02-16 ENCOUNTER — Encounter: Payer: Self-pay | Admitting: Physician Assistant

## 2016-02-16 VITALS — BP 140/58 | HR 95 | Ht 67.0 in | Wt 186.0 lb

## 2016-02-16 DIAGNOSIS — I2510 Atherosclerotic heart disease of native coronary artery without angina pectoris: Secondary | ICD-10-CM | POA: Diagnosis not present

## 2016-02-16 DIAGNOSIS — I481 Persistent atrial fibrillation: Secondary | ICD-10-CM | POA: Diagnosis not present

## 2016-02-16 DIAGNOSIS — Z8719 Personal history of other diseases of the digestive system: Secondary | ICD-10-CM

## 2016-02-16 DIAGNOSIS — N183 Chronic kidney disease, stage 3 unspecified: Secondary | ICD-10-CM

## 2016-02-16 DIAGNOSIS — I5032 Chronic diastolic (congestive) heart failure: Secondary | ICD-10-CM

## 2016-02-16 DIAGNOSIS — J449 Chronic obstructive pulmonary disease, unspecified: Secondary | ICD-10-CM

## 2016-02-16 DIAGNOSIS — I110 Hypertensive heart disease with heart failure: Secondary | ICD-10-CM

## 2016-02-16 DIAGNOSIS — I4819 Other persistent atrial fibrillation: Secondary | ICD-10-CM

## 2016-02-16 MED ORDER — METOPROLOL SUCCINATE ER 50 MG PO TB24: 50.0000 mg | ORAL_TABLET | Freq: Every day | ORAL | 3 refills | Status: DC

## 2016-02-16 NOTE — Patient Instructions (Addendum)
Medication Instructions:  If your weight increases by 3 lbs or more in 1 day, take Lasix (Furosemide) 40 mg once and call our office. STOP the Coreg (Carvedilol) STOP the Amiodarone STOP the Digoxin  START Toprol-XL 50 mg Once daily   Labwork: 1. Today - BMET 2. Get the Home Health Nurse to draw your Lipid Panel (cholesterol).  Make sure you do not eat or drink anything after midnight the night before your cholesterol is checked. YOU HAVE BEEN GIVEN A PRESCRIPTION TO GIVE TO THE HOME HEALTH NURSE FOR THE LAB WORK   Testing/Procedures: None   Follow-Up: Dr. Jenkins Rouge on 03/31/16 @ 11:45   Any Other Special Instructions Will Be Listed Below (If Applicable).  If you need a refill on your cardiac medications before your next appointment, please call your pharmacy.

## 2016-02-16 NOTE — Progress Notes (Signed)
Cardiology Office Note:    Date:  02/16/2016   ID:  Mark Rivers, DOB Dec 15, 1934, MRN [id number]  PCP:  [name]., MD  Cardiologist:Dr. Jenkins Rouge Electrophysiologist: n/a Nephrology: Dr. Olivia Mackie Phoebe Perch)  Referring MD: [name], MD   Chief Complaint  Patient presents with  . Hospitalization Follow-up    s/p GI bleed    History of Present Illness:    Mark Rivers is a 82 y.o. male with a hx of CAD, persistent AFib, DM2, CKD, HTN, HL, COPD.  He underwent remote angioplasty in 1988.  He was admitted in 10/17 with NSTEMI in the setting of AF with RVR.  He had critical LM stenosis on LHC and underwent emergent CABG (L-LAD, S-OM2, S-PDA).  He had a complex post op course notable for blood loss anemia requiring transfusion with PRBCs, difficulty in weaning from the ventilator resulting in tracheostomy, chest tube placement for R pleural effusion, MRSE bacteremia and AKI which limited diuresis.  He was placed on Coumadin for recurrent AF.  He was DC to [facility].    He was admitted 08/8889 with a/c diastolic CHF.  Last seen here in follow up 01/17/16.  I discussed his case with Dr. Jenkins Rouge and we decided to put him on Amiodarone.  The plan is to proceed with DCCV once his INR is therapeutic for 3-4 weeks.    He was admitted again 1/31-2/4 with an upper GI bleed c/b AKI, influenza.  He required transfusion with PRBCs x 1.  His diuretics were held due to hypotension and AKI.  These were not resumed at DC.  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  Coumadin was held but resumed at DC.  He was taken off of ASA.  Of note, he had Digoxin added to his medical regimen at some point (prior to admission).    He returns for follow up.  He is here with his brother.  The patient  denies chest pain, significant shortness of breath.  He denies orthopnea, PND, edema.  He denies syncope.  His weight has been stable since DC.    Prior CV studies:   The following studies were reviewed today:  Echo 01/11/16 Mod LVH, EF 55-60, inf AK, suspect mild aortic stenosis, mod MAC, mod LAE, mild reduced RVSF, mild RAE, PASP 47  Limited echo 11/09/15 EF 50-55, normal wall motion, ventricular septal dyssynergy, moderate MAC, mild MR, possible fibrinous linear projection originating from mitral annular calcification within LA  Complete echo 11/02/15 Moderate LVH, EF 55-60, normal wall motion, grade 2 diastolic dysfunction, mild aortic stenosis (mean 14, peak 34), MAC, mild LAE  LHC 10/26/15 LM distal 99 thrombotic LAD okay LCx okay RCA proximal 80, mid 30 EF 40-45  Myoview 10/16 EF 51, no ischemia or scar; Low Risk  Echo 6/14 Mild to moderate LVH, probable mild to moderately reduced LVEF, moderate LAE, MAC, mild MR, mild TR  Myoview 6/14 No ischemia, EF 49  Past Medical History:  Diagnosis Date  . Allergy   . Back pain   . Chronic diastolic CHF (congestive heart failure) ([name]) 12/21/2015   A. Echo 10/17: Moderate LVH, EF 55-60, normal wall motion, grade 2 diastolic dysfunction, mild aortic stenosis (mean 14, peak 34), MAC, mild LAE    . CKD (chronic kidney disease) stage 3, GFR 30-59 ml/min  03/04/2014  . Coronary artery disease    a. s/p remote POBA in 1988 // b. NSTEMI in 10/17 >> LHC with 99% LM stenosis >> emergent CABG (L-LAD, S-OM2, S-PDA) - post op course complicated (prolonged ventilation, s/p Trach, AFib, anemia req transfusion, bacteremia >> DC to LTAC)  . Diabetes mellitus   . Difficult intubation    difficult airway/FYI note 11/01/2015  . GERD (gastroesophageal reflux disease)   . History of nuclear stress test    a. Myoview 6/14 - EF 51, no ischemia or scar; Low Risk  . Hyperlipemia 09/11/2013   Last Assessment & Plan:  Lipid abnormalities are improving  with treatment. Nutritional counseling was provided. and Pharmacotherapy as ordered. Lipids will be reassessed in 3 months.  . Hypertension   . Neuropathy ([place])   . On home oxygen therapy    "2L; 24/7" (02/09/2016)  . Persistent atrial fibrillation ([name]) 10/25/2015    Past Surgical History:  Procedure Laterality Date  . CARDIAC CATHETERIZATION N/A 10/26/2015   Procedure: Left Heart Cath and Coronary Angiography;  Surgeon: Troy Sine, MD;  Location: [place] [facility];  Service: Cardiovascular;  Laterality: N/A;  . CATARACT EXTRACTION W/ INTRAOCULAR LENS  IMPLANT, BILATERAL  2008  . COLONOSCOPY    . CORONARY ANGIOPLASTY WITH STENT PLACEMENT  1988  . CORONARY ARTERY BYPASS GRAFT N/A 10/26/2015   Procedure: CORONARY ARTERY BYPASS GRAFTING (CABG) x3(LIMA to LA, SVG to OM1, SVG to PDA) with EVH from the left thigh and partial lower leg greater saphenous vein and left internal mammary artery;  Surgeon: Ivin Poot, MD;  Location: [place];  Service: Open Heart Surgery;  Laterality: N/A;  . ESOPHAGOGASTRODUODENOSCOPY (EGD) WITH PROPOFOL N/A 02/11/2016   Procedure: ESOPHAGOGASTRODUODENOSCOPY (EGD) WITH PROPOFOL;  Surgeon: Ladene Artist, MD;  Location: [facility];  Service: Endoscopy;  Laterality: N/A;  . TEE WITHOUT CARDIOVERSION N/A 10/26/2015   Procedure: TRANSESOPHAGEAL ECHOCARDIOGRAM (TEE);  Surgeon: Ivin Poot, MD;  Location: [place];  Service: Open Heart Surgery;  Laterality: N/A;  . TONSILLECTOMY      Current Medications: Current Meds  Medication Sig  . atorvastatin (LIPITOR) 80 MG tablet Take 1 tablet (80 mg total) by mouth daily.  . budesonide (PULMICORT) 0.5 MG/2ML nebulizer solution Take 0.5 mg by nebulization daily.   . cholecalciferol (VITAMIN D) 1000 units tablet Take 1,000 Units by mouth daily.  . fenofibrate 54 MG tablet Take 54 mg by mouth daily.  . Glucosamine-Chondroit-Vit C-Mn (GLUCOSAMINE CHONDR 1500 COMPLX PO) Take 2 tablets by mouth daily.   [name] loratadine  (CLARITIN) 10 MG tablet Take 1 tablet (10 mg total) by mouth daily.  [name] oseltamivir (TAMIFLU) 30 MG capsule Take 1 capsule (30 mg total) by mouth 2 (two) times daily.  . pantoprazole (PROTONIX) 40 MG tablet Take 1 tablet (40 mg total) by mouth daily at 6 (six) AM.  . TOUJEO SOLOSTAR 300 UNIT/ML SOPN Inject 30 Units into the skin every morning.   . traZODone (DESYREL) 100 MG tablet Take 100 mg by mouth at bedtime.   [name] warfarin (COUMADIN) 1 MG tablet Take 2-3 tablets (2-3 mg total) by mouth See admin instructions. Take 3 mg by mouth on Sunday and Thursday. Take 2 mg by mouth on all other days  . [DISCONTINUED] amiodarone (PACERONE) 200 MG tablet Take 1 tablet (200 mg total) by mouth daily.  . [DISCONTINUED] carvedilol (COREG) 3.125 MG tablet Take 3.125 mg by mouth 2 (two) times daily with a meal.  . [DISCONTINUED] digoxin (  LANOXIN) 0.125 MG tablet Take 125 mcg by mouth daily.     Allergies:   Amoxicillin; Cephalexin; and Penicillin g   Social History   Social History  . Marital status: Married    Spouse name: N/A  . Number of children: N/A  . Years of education: N/A   Occupational History  . retired    Social History Main Topics  . Smoking status: Former Smoker    Packs/day: 2.00    Years: 30.00    Types: Cigarettes    Quit date: 06/28/2004  . Smokeless tobacco: Never Used  . Alcohol use No     Comment: QUIT 06/28/2004  . Drug use: No  . Sexual activity: Yes   Other Topics Concern  . None   Social History Narrative  . None     Family History  Problem Relation Age of Onset  . Diabetes Father   . Colon cancer Neg Hx   . Esophageal cancer Neg Hx   . Stomach cancer Neg Hx   . Rectal cancer Neg Hx   . Allergic rhinitis Neg Hx   . Angioedema Neg Hx   . Asthma Neg Hx   . Eczema Neg Hx   . Immunodeficiency Neg Hx   . Urticaria Neg Hx      ROS:   Please see the history of present illness.    Review of Systems  HENT: Positive for hearing loss.   Cardiovascular: Positive  for irregular heartbeat.  Respiratory: Positive for cough and wheezing.    All other systems reviewed and are negative.   EKGs/Labs/Other Test Reviewed:    EKG:  EKG is  ordered today.  The ekg ordered today demonstrates AFib, HR 66  Recent Labs: 11/27/2015: TSH 2.639 01/09/2016: B Natriuretic Peptide 404.3 01/12/2016: Magnesium 2.1 02/09/2016: ALT 19 02/12/2016: BUN 37; Creatinine, Ser 1.52; Hemoglobin 9.6; Platelets 200; Potassium 4.6; Sodium 140   Recent Lipid Panel    Component Value Date/Time   TRIG 268 (H) 11/05/2015 1325     Physical Exam:    VS:  BP (!) 140/58 (BP Location: Right Arm, Patient Position: Sitting, Cuff Size: Normal)   Pulse 95   Ht _0  (1.702 m)   Wt 186 lb (84.4 kg)   BMI 29.13 kg/m     Wt Readings from Last 3 Encounters:  02/16/16 186 lb (84.4 kg)  02/13/16 184 lb 11.2 oz (83.8 kg)  01/17/16 193 lb 12.8 oz (87.9 kg)     Physical Exam  Constitutional: He is oriented to person, place, and time. He appears well-developed and well-nourished. No distress.  HENT:  Head: Normocephalic and atraumatic.  Eyes: No scleral icterus.  Neck: No JVD present.  Cardiovascular: Normal rate, S1 normal and S2 normal.  An irregularly irregular rhythm present.  No murmur heard. Pulmonary/Chest: He has decreased breath sounds. He has rhonchi. He has no rales.  Abdominal: Soft. He exhibits no distension.  Musculoskeletal: He exhibits no edema.  Neurological: He is alert and oriented to person, place, and time.  Skin: Skin is warm and dry.  Psychiatric: He has a normal mood and affect.    ASSESSMENT:    1. Coronary artery disease involving native coronary artery of native heart without angina pectoris   2. Persistent atrial fibrillation ([place])   3. Chronic diastolic CHF (congestive heart failure) ([place])   4. CKD (chronic kidney disease) stage 3, GFR 30-59 ml/min   5. Hypertensive heart disease with CHF (congestive heart failure) ([place])  6. Chronic obstructive  pulmonary disease, unspecified COPD type ([place])   7. History of GI bleed    PLAN:    In order of problems listed above:  1. CAD - s/p NSTEMI in the setting of AF with RVR in 10/17 followed by CABG.  He is now off of ASA due to recent GI bleed.  He is no longer on nitrates due to hypotension. He denies angina.  Continue beta-blocker, statin.    2. Persistent AF - He remains in AF.  He is probably asymptomatic.  He had a recent GI bleed.  He is back on Coumadin now. However, after review with Dr. Jenkins Rouge today, we will DC his Coumadin (see below).  At this point, we cannot pursue DCCV.  Somehow, he ended up on Digoxin for unclear reasons.  With normal ejection fraction, he should not be on this drug.  I d/w Dr. Jenkins Rouge regarding his Digoxin and Amiodarone and we have decided to stop it.   -  DC Coumadin  -  DC Digoxin  -  DC Amiodarone  3. Chronic diastolic CHF - Volume is stable.  He is off of Lasix.  We discussed the importance of daily weights and when to take Lasix.  4. CKD - His creatinine increased in the hospital.  He is due for FU with his nephrologist.  Check BMET today.  5. HTN - BP is controlled.  Adjust medications as noted.   6. COPD - Rhonchi noted on exam.  He sees his PCP tomorrow.  FU with PCP for management of COPD.  I am concerned that a non-selective beta-blocker is not likely going to be tolerated.    -  DC Coreg  -  Start Toprol-XL 50 mg QD  7. Hx of GI bleed - He is s/p GI bleed requiring transfusion.  Of note, his path report in Epic is positive for invasive adenocarcinoma.  The patient has not been contacted with these results yet.  He does see GI next week for follow up. I will defer counseling on this diagnosis to GI.  I discussed his case with Dr. Jenkins Rouge.  He is at risk for recurrent bleeding with gastric CA.  Therefore, we have stopped his coumadin today.    Medication Adjustments/Labs and Tests Ordered: Current medicines are reviewed at length  with the patient today.  Concerns regarding medicines are outlined above.  Medication changes, Labs and Tests ordered today are outlined in the Patient Instructions noted below. Patient Instructions  Medication Instructions:  If your weight increases by 3 lbs or more in 1 day, take Lasix (Furosemide) 40 mg once and call our office. STOP the Coreg (Carvedilol) STOP the Amiodarone STOP the Digoxin  START Toprol-XL 50 mg Once daily   Labwork: 1. Today - BMET 2. Get the Home Health Nurse to draw your Lipid Panel (cholesterol).  Make sure you do not eat or drink anything after midnight the night before your cholesterol is checked. YOU HAVE BEEN GIVEN A PRESCRIPTION TO GIVE TO THE HOME HEALTH NURSE FOR THE LAB WORK   Testing/Procedures: None   Follow-Up: Dr. Jenkins Rouge on 03/31/16 @ 11:45   Any Other Special Instructions Will Be Listed Below (If Applicable).  If you need a refill on your cardiac medications before your next appointment, please call your pharmacy.   Return in about 1 month (around 03/15/2016) for Close Follow Up with Dr. Johnsie Cancel.   Signed, Richardson Dopp, PA-C  02/16/2016 4:46 PM  [facility] [place], [place], [place]  [address] Phone: [phone]; Fax: [phone]

## 2016-02-17 ENCOUNTER — Other Ambulatory Visit: Payer: Self-pay

## 2016-02-17 ENCOUNTER — Telehealth: Payer: Self-pay | Admitting: Physician Assistant

## 2016-02-17 DIAGNOSIS — C169 Malignant neoplasm of stomach, unspecified: Secondary | ICD-10-CM

## 2016-02-17 LAB — BASIC METABOLIC PANEL
BUN/Creatinine Ratio: 20 (ref 10–24)
BUN: 28 mg/dL — AB (ref 8–27)
CALCIUM: 9.1 mg/dL (ref 8.6–10.2)
CHLORIDE: 102 mmol/L (ref 96–106)
CO2: 25 mmol/L (ref 18–29)
Creatinine, Ser: 1.43 mg/dL — ABNORMAL HIGH (ref 0.76–1.27)
GFR calc Af Amer: 53 mL/min/{1.73_m2} — ABNORMAL LOW (ref >59)
GFR calc non Af Amer: 46 mL/min/{1.73_m2} — ABNORMAL LOW (ref >59)
GLUCOSE: 78 mg/dL (ref 65–99)
Potassium: 4.5 mmol/L (ref 3.5–5.2)
Sodium: 145 mmol/L — ABNORMAL HIGH (ref 134–144)

## 2016-02-17 NOTE — Telephone Encounter (Signed)
Pt remember that Mark Dopp PA discussed with pt not to take the coumadin for know. Pt has stop taking the coumadin for now . Pt is aware also that it will be decided if he can resume coumadin later on. Pt is aware he  verbalized understanding.

## 2016-02-17 NOTE — Progress Notes (Signed)
Instructions to stop Coumadin were left off of his AVS. Patient will be called to make sure he knows to stop Coumadin. This was discussed with him at office visit. Richardson Dopp, PA-C   02/17/2016 10:26 AM

## 2016-02-17 NOTE — Telephone Encounter (Signed)
Please call patient and make sure he knows that he is to STOP taking Coumadin. We discussed this yesterday but I did not put it on his instructions. He should stop Coumadin for now.  We can decide later if he can resume this.  Thanks, Richardson Dopp, PA-C   02/17/2016 10:21 AM

## 2016-02-18 ENCOUNTER — Telehealth: Payer: Self-pay | Admitting: *Deleted

## 2016-02-18 NOTE — Telephone Encounter (Signed)
Spoke with Janett Billow nurse with [facility] as she had an  order to check pt's INR today Pt was seen by Richardson Dopp on Feb 7th and after discussion with Dr Johnsie Cancel  felt that he should stop coumadin at this time and they will let us know if he is to restart so the INR scheduled for today would be cancelled . Janett Billow states she is going to home and she will be sure he is not taking the coumadin

## 2016-02-21 ENCOUNTER — Other Ambulatory Visit: Payer: Medicare Other

## 2016-02-21 ENCOUNTER — Ambulatory Visit (INDEPENDENT_AMBULATORY_CARE_PROVIDER_SITE_OTHER)
Admission: RE | Admit: 2016-02-21 | Discharge: 2016-02-21 | Disposition: A | Discharge status: Home / Self Care | Payer: Medicare Other | Source: Ambulatory Visit | Attending: Gastroenterology | Admitting: Gastroenterology

## 2016-02-21 DIAGNOSIS — C169 Malignant neoplasm of stomach, unspecified: Secondary | ICD-10-CM

## 2016-02-21 MED ORDER — IOPAMIDOL (ISOVUE-300) INJECTION 61%
100.0000 mL | Freq: Once | INTRAVENOUS | Status: AC | PRN
  Administered 2016-02-21: 80 mL via INTRAVENOUS

## 2016-02-22 ENCOUNTER — Ambulatory Visit (INDEPENDENT_AMBULATORY_CARE_PROVIDER_SITE_OTHER): Payer: Medicare Other | Admitting: Gastroenterology

## 2016-02-22 ENCOUNTER — Encounter: Payer: Self-pay | Admitting: Gastroenterology

## 2016-02-22 VITALS — BP 130/62 | HR 68 | Ht 67.0 in | Wt 190.5 lb

## 2016-02-22 DIAGNOSIS — I2510 Atherosclerotic heart disease of native coronary artery without angina pectoris: Secondary | ICD-10-CM | POA: Diagnosis not present

## 2016-02-22 DIAGNOSIS — C169 Malignant neoplasm of stomach, unspecified: Secondary | ICD-10-CM | POA: Insufficient documentation

## 2016-02-22 NOTE — Progress Notes (Signed)
   02/22/2016 Mark Rivers [id number] 06/06/1934   HISTORY OF PRESENT ILLNESS:  This is an 82 year old male who is known to Dr. Stark.  Most recently was seen during a hospitalization earlier this month for anemia and heme-positive stools. Underwent endoscopy and was found to have an bleeding AVM that was cauterized, but also had a gastric nodule that was biopsied. This was found to be a primary invasive gastric adenocarcinoma.  He underwent CT of the chest, abdomen, and pelvis earlier today without any findings consistent with metastatic disease. He is already scheduled to see [facility] surgery tomorrow afternoon. He tells me that he has been feeling well. Denies seeing any black stools. He has persistent atrial fibrillation, but his Coumadin has been placed on hold.  Is on O2 for pulmonary HTN.   Past Medical History:  Diagnosis Date  . Allergy   . Back pain   . Chronic diastolic CHF (congestive heart failure) ([facility]) 12/21/2015   A. Echo 10/17: Moderate LVH, EF 55-60, normal wall motion, grade 2 diastolic dysfunction, mild aortic stenosis (mean 14, peak 34), MAC, mild LAE    . CKD (chronic kidney disease) stage 3, GFR 30-59 ml/min 03/04/2014  . Coronary artery disease    a. s/p remote POBA in 1988 // b. NSTEMI in 10/17 >> LHC with 99% LM stenosis >> emergent CABG (L-LAD, S-OM2, S-PDA) - post op course complicated (prolonged ventilation, s/p Trach, AFib, anemia req transfusion, bacteremia >> DC to LTAC)  . Diabetes mellitus   . Difficult intubation    difficult airway/FYI note 11/01/2015  . GERD (gastroesophageal reflux disease)   . GI bleed   . History of nuclear stress test    a. Myoview 6/14 - EF 51, no ischemia or scar; Low Risk  . Hyperlipemia 09/11/2013   Last Assessment & Plan:  Lipid abnormalities are improving with treatment. Nutritional counseling was provided. and Pharmacotherapy as ordered. Lipids will be reassessed in 3 months.  . Hypertension   . Neuropathy ([facility])   . On home  oxygen therapy    "2L; 24/7" (02/09/2016)  . Persistent atrial fibrillation ([facility]) 10/25/2015   Past Surgical History:  Procedure Laterality Date  . CARDIAC CATHETERIZATION N/A 10/26/2015   Procedure: Left Heart Cath and Coronary Angiography;  Surgeon: Thomas A Kelly, MD;  Location: [facility];  Service: Cardiovascular;  Laterality: N/A;  . CATARACT EXTRACTION W/ INTRAOCULAR LENS  IMPLANT, BILATERAL  2008  . COLONOSCOPY    . CORONARY ANGIOPLASTY WITH STENT PLACEMENT  1988  . CORONARY ARTERY BYPASS GRAFT N/A 10/26/2015   Procedure: CORONARY ARTERY BYPASS GRAFTING (CABG) x3(LIMA to LA, SVG to OM1, SVG to PDA) with EVH from the left thigh and partial lower leg greater saphenous vein and left internal mammary artery;  Surgeon: Peter Van Trigt, MD;  Location: [facility];  Service: Open Heart Surgery;  Laterality: N/A;  . ESOPHAGOGASTRODUODENOSCOPY (EGD) WITH PROPOFOL N/A 02/11/2016   Procedure: ESOPHAGOGASTRODUODENOSCOPY (EGD) WITH PROPOFOL;  Surgeon: Malcolm T Stark, MD;  Location: [facility];  Service: Endoscopy;  Laterality: N/A;  . TEE WITHOUT CARDIOVERSION N/A 10/26/2015   Procedure: TRANSESOPHAGEAL ECHOCARDIOGRAM (TEE);  Surgeon: Peter Van Trigt, MD;  Location: [facility];  Service: Open Heart Surgery;  Laterality: N/A;  . TONSILLECTOMY      reports that he quit smoking about 11 years ago. His smoking use included Cigarettes. He has a 60.00 pack-year smoking history. He has never used smokeless tobacco. He reports that he does not drink alcohol   or use drugs. family history includes Diabetes in his father. Allergies  Allergen Reactions  . Amoxicillin Other (See Comments)    Other reaction(s): Other (See Comments) PATIENT PASSES OUT!!!! Passes out PATIENT PASSES OUT!!!!  . Cephalexin Other (See Comments)    PATIENT PASSES OUT!!!! Passes out PATIENT PASSES OUT!!!!  . Penicillin G Other (See Comments)    Has patient had a PCN reaction causing immediate rash, facial/tongue/throat swelling,  SOB or lightheadedness with hypotension: Yes Has patient had a PCN reaction causing severe rash involving mucus membranes or skin necrosis: No Has patient had a PCN reaction that required hospitalization: Yes Has patient had a PCN reaction occurring within the last 10 years: Yes If all of the above answers are "NO", then may proceed with Cephalosporin use. PATIENT PASSES OUT!!!! Has patient had a PCN reaction causing immediate rash, facial/tongue/throat swelling, SOB or lightheadedness with hypotension: Yes Has patient had a PCN reaction causing severe rash involving mucus membranes or skin necrosis: No Has patient had a PCN reaction that required hospitalization: Yes Has patient had a PCN reaction occurring within the last 10 years: Yes If all of the above answers are "NO", then may proceed with Cephalosporin use. PATIENT PASSES OUT!!!!      Outpatient Encounter Prescriptions as of 02/22/2016  Medication Sig  . atorvastatin (LIPITOR) 80 MG tablet Take 1 tablet (80 mg total) by mouth daily.  . budesonide (PULMICORT) 0.5 MG/2ML nebulizer solution Take 0.5 mg by nebulization daily.   . cholecalciferol (VITAMIN D) 1000 units tablet Take 1,000 Units by mouth daily.  . fenofibrate 54 MG tablet Take 54 mg by mouth daily.  . Glucosamine-Chondroit-Vit C-Mn (GLUCOSAMINE CHONDR 1500 COMPLX PO) Take 2 tablets by mouth daily.   . loratadine (CLARITIN) 10 MG tablet Take 1 tablet (10 mg total) by mouth daily.  . metoprolol succinate (TOPROL-XL) 50 MG 24 hr tablet Take 1 tablet (50 mg total) by mouth daily. Take with or immediately following a meal.  . pantoprazole (PROTONIX) 40 MG tablet Take 1 tablet (40 mg total) by mouth daily at 6 (six) AM.  . TOUJEO SOLOSTAR 300 UNIT/ML SOPN Inject 30 Units into the skin every morning.   . traZODone (DESYREL) 100 MG tablet Take 100 mg by mouth at bedtime.   . [DISCONTINUED] oseltamivir (TAMIFLU) 30 MG capsule Take 1 capsule (30 mg total) by mouth 2 (two) times  daily.   No facility-administered encounter medications on file as of 02/22/2016.      REVIEW OF SYSTEMS  : All other systems reviewed and negative except where noted in the History of Present Illness.   PHYSICAL EXAM: BP 130/62   Pulse 68   Ht 5' 7" (1.702 m)   Wt 190 lb 8 oz (86.4 kg)   BMI 29.84 kg/m  General: Well developed white male in no acute distress Head: Normocephalic and atraumatic Eyes:  Sclerae anicteric, conjunctiva pink. Ears: Normal auditory acuity Lungs: Clear throughout to auscultation Heart:  Irregularly irregular Abdomen: Soft, non-distended.  Normal bowel sounds.  Non-tender. Musculoskeletal: Symmetrical with no gross deformities  Skin: No lesions on visible extremities Extremities: No edema  Neurological: Alert oriented x 4, grossly non-focal Psychological:  Alert and cooperative. Normal mood and affect  ASSESSMENT AND PLAN: -Newly diagnosed gastric cancer:  Already had CT chest, abdomen, and pelvis.  Has appt with [facility] tomorrow.  Will schedule for upper EUS with Dr. Jacobs. -O2 dependent due to pulmonary HTN -Persistent atrial fibrillation, previously on coumadin which is on hold   for now  *The risks, benefits, and alternatives to upper EUS were discussed with the patient and he consents to proceed.   CC:  [name], MD

## 2016-02-22 NOTE — Patient Instructions (Signed)
You have been scheduled for an EUS with Dr [name] at [facility] on 03/16/2016 at 8:15am. Please follow written instructions given to you at your visit today. If you use inhalers (even only as needed), please bring them with you on the day of your procedure.

## 2016-02-23 ENCOUNTER — Telehealth: Payer: Self-pay

## 2016-02-23 NOTE — Telephone Encounter (Signed)
The pt has been moved to 03/09/16 730 am.  He was instructed and verbalized understanding of the changes.  [facility] endo was called and the appt changed from the 8th to 03/09/16

## 2016-02-23 NOTE — Telephone Encounter (Signed)
-----   Message from Milus Banister, MD sent at 02/23/2016  7:47 AM EST ----- Chong Sicilian, Turns out I'm not taking Thursday March 1st off and so can you move his EUS from the 8th to the 1st (and can open up the rest of the 1st like a usual Thursday)?  Thanks  Moshe Cipro, [place] get him in a bit sooner. Thanks  DJ

## 2016-02-23 NOTE — Progress Notes (Signed)
Reviewed and agree with management plan.  Angelisse Riso T. Olyvia Gopal, MD FACG 

## 2016-02-28 ENCOUNTER — Telehealth: Payer: Self-pay | Admitting: *Deleted

## 2016-02-28 NOTE — Telephone Encounter (Signed)
Left patient a message to contact office to schedule an appointment.

## 2016-02-28 NOTE — Telephone Encounter (Signed)
-----   Message from Chesley Mires, MD sent at 02/25/2016  8:43 AM EST -----  Genevia Bouldin,  Please schedule preop respiratory assessment visit with either Dr. Ashok Cordia or myself.  Thanks.  V  ----- Message ----- From: Stark Klein, MD Sent: 02/23/2016   4:40 PM To: Chesley Mires, MD, Javier Glazier, MD  You guys both saw this patient while he was in the hospital.  He had GI bleed and COPD/Cardiac issues.  He is on home oxygen.  Wondering if you guys could see him to assess him for risk stratification for open partial gastrectomy.  I am thinking he is too high risk overall due to frailty, but would like another opinion.    Tx Stark Klein, MD

## 2016-03-06 NOTE — Telephone Encounter (Signed)
error 

## 2016-03-07 NOTE — Telephone Encounter (Signed)
Pt had called back and scheduled his appt for Dr Ashok Cordia but it was not documented in the telephone note.  Pt scheduled for 03/27/16 with Dr Ashok Cordia. I called to follow up and patient states that he is meeting with the surgeon 03/17/16 to discuss the procedure. Pt states that he was told that they may not move forward with the surgery d/t his age unless they deemed it medically necessary. Pt advised to contact our office once he meets with the surgeon so that we can ensure that his appt is scheduled prior to the surgery.   Will send to Dr Halford Chessman as Juluis Rainier.

## 2016-03-07 NOTE — Telephone Encounter (Signed)
Noted  

## 2016-03-09 ENCOUNTER — Ambulatory Visit (HOSPITAL_COMMUNITY)
Admission: RE | Admit: 2016-03-09 | Discharge: 2016-03-09 | Disposition: A | Discharge status: Home / Self Care | Payer: Medicare Other | Source: Ambulatory Visit | Attending: Gastroenterology | Admitting: Gastroenterology

## 2016-03-09 ENCOUNTER — Ambulatory Visit (HOSPITAL_COMMUNITY): Payer: Medicare Other | Admitting: Anesthesiology

## 2016-03-09 ENCOUNTER — Encounter (HOSPITAL_COMMUNITY)
Admission: RE | Disposition: A | Discharge status: Home / Self Care | Payer: Self-pay | Source: Ambulatory Visit | Attending: Gastroenterology

## 2016-03-09 ENCOUNTER — Telehealth: Payer: Self-pay

## 2016-03-09 ENCOUNTER — Encounter (HOSPITAL_COMMUNITY): Payer: Self-pay

## 2016-03-09 DIAGNOSIS — J449 Chronic obstructive pulmonary disease, unspecified: Secondary | ICD-10-CM | POA: Insufficient documentation

## 2016-03-09 DIAGNOSIS — Z9841 Cataract extraction status, right eye: Secondary | ICD-10-CM | POA: Insufficient documentation

## 2016-03-09 DIAGNOSIS — K228 Other specified diseases of esophagus: Secondary | ICD-10-CM | POA: Diagnosis not present

## 2016-03-09 DIAGNOSIS — C160 Malignant neoplasm of cardia: Secondary | ICD-10-CM

## 2016-03-09 DIAGNOSIS — K219 Gastro-esophageal reflux disease without esophagitis: Secondary | ICD-10-CM | POA: Diagnosis not present

## 2016-03-09 DIAGNOSIS — E1140 Type 2 diabetes mellitus with diabetic neuropathy, unspecified: Secondary | ICD-10-CM | POA: Diagnosis not present

## 2016-03-09 DIAGNOSIS — Z79899 Other long term (current) drug therapy: Secondary | ICD-10-CM | POA: Insufficient documentation

## 2016-03-09 DIAGNOSIS — E1122 Type 2 diabetes mellitus with diabetic chronic kidney disease: Secondary | ICD-10-CM | POA: Diagnosis not present

## 2016-03-09 DIAGNOSIS — I2510 Atherosclerotic heart disease of native coronary artery without angina pectoris: Secondary | ICD-10-CM | POA: Insufficient documentation

## 2016-03-09 DIAGNOSIS — Z9842 Cataract extraction status, left eye: Secondary | ICD-10-CM | POA: Diagnosis not present

## 2016-03-09 DIAGNOSIS — I272 Pulmonary hypertension, unspecified: Secondary | ICD-10-CM | POA: Insufficient documentation

## 2016-03-09 DIAGNOSIS — Z87891 Personal history of nicotine dependence: Secondary | ICD-10-CM | POA: Diagnosis not present

## 2016-03-09 DIAGNOSIS — Z9981 Dependence on supplemental oxygen: Secondary | ICD-10-CM | POA: Diagnosis not present

## 2016-03-09 DIAGNOSIS — Z881 Allergy status to other antibiotic agents status: Secondary | ICD-10-CM | POA: Insufficient documentation

## 2016-03-09 DIAGNOSIS — M549 Dorsalgia, unspecified: Secondary | ICD-10-CM | POA: Insufficient documentation

## 2016-03-09 DIAGNOSIS — Z955 Presence of coronary angioplasty implant and graft: Secondary | ICD-10-CM | POA: Insufficient documentation

## 2016-03-09 DIAGNOSIS — I5032 Chronic diastolic (congestive) heart failure: Secondary | ICD-10-CM | POA: Insufficient documentation

## 2016-03-09 DIAGNOSIS — E785 Hyperlipidemia, unspecified: Secondary | ICD-10-CM | POA: Diagnosis not present

## 2016-03-09 DIAGNOSIS — Z951 Presence of aortocoronary bypass graft: Secondary | ICD-10-CM | POA: Diagnosis not present

## 2016-03-09 DIAGNOSIS — N183 Chronic kidney disease, stage 3 (moderate): Secondary | ICD-10-CM | POA: Diagnosis not present

## 2016-03-09 DIAGNOSIS — Z833 Family history of diabetes mellitus: Secondary | ICD-10-CM | POA: Insufficient documentation

## 2016-03-09 DIAGNOSIS — C169 Malignant neoplasm of stomach, unspecified: Secondary | ICD-10-CM

## 2016-03-09 DIAGNOSIS — Z7901 Long term (current) use of anticoagulants: Secondary | ICD-10-CM | POA: Insufficient documentation

## 2016-03-09 DIAGNOSIS — I252 Old myocardial infarction: Secondary | ICD-10-CM | POA: Diagnosis not present

## 2016-03-09 DIAGNOSIS — Z880 Allergy status to penicillin: Secondary | ICD-10-CM | POA: Diagnosis not present

## 2016-03-09 DIAGNOSIS — I130 Hypertensive heart and chronic kidney disease with heart failure and stage 1 through stage 4 chronic kidney disease, or unspecified chronic kidney disease: Secondary | ICD-10-CM | POA: Diagnosis not present

## 2016-03-09 DIAGNOSIS — I481 Persistent atrial fibrillation: Secondary | ICD-10-CM | POA: Diagnosis not present

## 2016-03-09 HISTORY — PX: EUS: SHX5427

## 2016-03-09 LAB — GLUCOSE, CAPILLARY: GLUCOSE-CAPILLARY: 85 mg/dL (ref 65–99)

## 2016-03-09 SURGERY — UPPER ENDOSCOPIC ULTRASOUND (EUS) RADIAL
Anesthesia: Monitor Anesthesia Care

## 2016-03-09 MED ORDER — PROPOFOL 10 MG/ML IV BOLUS
INTRAVENOUS | Status: AC
  Filled 2016-03-09: qty 60

## 2016-03-09 MED ORDER — SPOT INK MARKER SYRINGE KIT
PACK | SUBMUCOSAL | Status: DC | PRN
  Administered 2016-03-09: 3.5 mL via SUBMUCOSAL

## 2016-03-09 MED ORDER — LACTATED RINGERS IV SOLN
INTRAVENOUS | Status: DC
  Administered 2016-03-09: 1000 mL via INTRAVENOUS

## 2016-03-09 MED ORDER — PROPOFOL 10 MG/ML IV BOLUS
INTRAVENOUS | Status: DC | PRN
  Administered 2016-03-09: 20 mg via INTRAVENOUS

## 2016-03-09 MED ORDER — LIDOCAINE 2% (20 MG/ML) 5 ML SYRINGE
INTRAMUSCULAR | Status: DC | PRN
  Administered 2016-03-09: 60 mg via INTRAVENOUS

## 2016-03-09 MED ORDER — PROPOFOL 500 MG/50ML IV EMUL
INTRAVENOUS | Status: DC | PRN
  Administered 2016-03-09: 100 ug/kg/min via INTRAVENOUS

## 2016-03-09 MED ORDER — LIDOCAINE 2% (20 MG/ML) 5 ML SYRINGE
INTRAMUSCULAR | Status: AC
  Filled 2016-03-09: qty 5

## 2016-03-09 MED ORDER — SPOT INK MARKER SYRINGE KIT
PACK | SUBMUCOSAL | Status: AC
  Filled 2016-03-09: qty 5

## 2016-03-09 MED ORDER — SODIUM CHLORIDE 0.9 % IV SOLN: INTRAVENOUS | Status: DC

## 2016-03-09 NOTE — Anesthesia Preprocedure Evaluation (Signed)
Anesthesia Evaluation  Patient identified by MRN, date of birth, ID band Patient awake    Reviewed: Allergy & Precautions, NPO status , Patient's Chart, lab work & pertinent test results  History of Anesthesia Complications (+) DIFFICULT AIRWAYHistory of anesthetic complications: unsure where this came from. Was a Grade 1 view in 10/17 for CABG.  Airway Mallampati: II  TM Distance: >3 FB     Dental  (+) Teeth Intact, Upper Dentures   Pulmonary COPD,  oxygen dependent, former smoker,    breath sounds clear to auscultation       Cardiovascular hypertension, + CAD, + Cardiac Stents, + CABG (2017) and +CHF  + dysrhythmias Atrial Fibrillation  Rhythm:Regular Rate:Normal     Neuro/Psych negative neurological ROS  negative psych ROS   GI/Hepatic Neg liver ROS, GERD  ,  Endo/Other  diabetes  Renal/GU Renal diseasenegative Renal ROS  negative genitourinary   Musculoskeletal negative musculoskeletal ROS (+)   Abdominal   Peds negative pediatric ROS (+)  Hematology negative hematology ROS (+)   Anesthesia Other Findings   Reproductive/Obstetrics negative OB ROS                             Anesthesia Physical  Anesthesia Plan  ASA: III  Anesthesia Plan: MAC   Post-op Pain Management:    Induction: Intravenous  Airway Management Planned: Nasal Cannula  Additional Equipment:   Intra-op Plan:   Post-operative Plan:   Informed Consent: I have reviewed the patients History and Physical, chart, labs and discussed the procedure including the risks, benefits and alternatives for the proposed anesthesia with the patient or authorized representative who has indicated his/her understanding and acceptance.   Dental advisory given  Plan Discussed with: CRNA, Anesthesiologist and Surgeon  Anesthesia Plan Comments:         Anesthesia Quick Evaluation

## 2016-03-09 NOTE — Anesthesia Postprocedure Evaluation (Signed)
Anesthesia Post Note  Patient: Trevell Pariseau  Procedure(s) Performed: Procedure(s) (LRB): UPPER ENDOSCOPIC ULTRASOUND (EUS) RADIAL (N/A)  Patient location during evaluation: Endoscopy Anesthesia Type: MAC Level of consciousness: awake and alert Pain management: pain level controlled Vital Signs Assessment: post-procedure vital signs reviewed and stable Respiratory status: spontaneous breathing, nonlabored ventilation, respiratory function stable and patient connected to nasal cannula oxygen Cardiovascular status: stable and blood pressure returned to baseline Anesthetic complications: no       Last Vitals:  Vitals:   03/09/16 0820 03/09/16 0840  BP: 118/76 (!) 124/96  Pulse: 75 70  Resp: (!) 21 (!) 23  Temp:      Last Pain:  Vitals:   03/09/16 0806  TempSrc: Oral                 Montez Hageman

## 2016-03-09 NOTE — Discharge Instructions (Signed)
YOU HAD AN ENDOSCOPIC PROCEDURE TODAY: Refer to the procedure report and other information in the discharge instructions given to you for any specific questions about what was found during the examination. If this information does not answer your questions, please call [place] office at [phone] to clarify.  ° °YOU SHOULD EXPECT: Some feelings of bloating in the abdomen. Passage of more gas than usual. Walking can help get rid of the air that was put into your GI tract during the procedure and reduce the bloating. If you had a lower endoscopy (such as a colonoscopy or flexible sigmoidoscopy) you may notice spotting of blood in your stool or on the toilet paper. Some abdominal soreness may be present for a day or two, also. ° °DIET: Your first meal following the procedure should be a light meal and then it is ok to progress to your normal diet. A half-sandwich or bowl of soup is an example of a good first meal. Heavy or fried foods are harder to digest and may make you feel nauseous or bloated. Drink plenty of fluids but you should avoid alcoholic beverages for 24 hours. If you had a esophageal dilation, please see attached instructions for diet.   ° °ACTIVITY: Your care partner should take you home directly after the procedure. You should plan to take it easy, moving slowly for the rest of the day. You can resume normal activity the day after the procedure however YOU SHOULD NOT DRIVE, use power tools, machinery or perform tasks that involve climbing or major physical exertion for 24 hours (because of the sedation medicines used during the test).  ° °SYMPTOMS TO REPORT IMMEDIATELY: °A gastroenterologist can be reached at any hour. Please call [phone]  for any of the following symptoms:  °Following lower endoscopy (colonoscopy, flexible sigmoidoscopy) °Excessive amounts of blood in the stool  °Significant tenderness, worsening of abdominal pains  °Swelling of the abdomen that is new, acute  °Fever of 100° or  higher  °Following upper endoscopy (EGD, EUS, ERCP, esophageal dilation) °Vomiting of blood or coffee ground material  °New, significant abdominal pain  °New, significant chest pain or pain under the shoulder blades  °Painful or persistently difficult swallowing  °New shortness of breath  °Black, tarry-looking or red, bloody stools ° °FOLLOW UP:  °If any biopsies were taken you will be contacted by phone or by letter within the next 1-3 weeks. Call [phone]  if you have not heard about the biopsies in 3 weeks.  °Please also call with any specific questions about appointments or follow up tests. ° °

## 2016-03-09 NOTE — Interval H&P Note (Signed)
History and Physical Interval Note:  03/09/2016 7:25 AM  Mark Rivers  has presented today for surgery, with the diagnosis of gastric cancer   The various methods of treatment have been discussed with the patient and family. After consideration of risks, benefits and other options for treatment, the patient has consented to  Procedure(s): UPPER ENDOSCOPIC ULTRASOUND (EUS) RADIAL (N/A) as a surgical intervention .  The patient's history has been reviewed, patient examined, no change in status, stable for surgery.  I have reviewed the patient's chart and labs.  Questions were answered to the patient's satisfaction.     Milus Banister

## 2016-03-09 NOTE — Op Note (Signed)
[facility] Patient Name: Mark Rivers Procedure Date: 03/09/2016 MRN: [id number] Attending MD: Milus Banister , MD Date of Birth: September 08, 1934 CSN: [id number] Age: 82 Admit Type: Outpatient Procedure:                Upper EUS Indications:              Proximal gastric nodule, adenocarcinoma on path.                            Staging CTs showed no evidence of metastasis Providers:                Milus Banister, MD, Elmer Ramp. Tilden Dome, RN,                            William Dalton, Technician Referring MD:             [name], MD Medicines:                Monitored Anesthesia Care Complications:            No immediate complications. Estimated blood loss:                            None. Estimated Blood Loss:     Estimated blood loss: none. Procedure:                Pre-Anesthesia Assessment:                           - Prior to the procedure, a History and Physical                            was performed, and patient medications and                            allergies were reviewed. The patient's tolerance of                            previous anesthesia was also reviewed. The risks                            and benefits of the procedure and the sedation                            options and risks were discussed with the patient.                            All questions were answered, and informed consent                            was obtained. Prior Anticoagulants: The patient has                            taken no previous anticoagulant or antiplatelet  agents. ASA Grade Assessment: IV - A patient with                            severe systemic disease that is a constant threat                            to life. After reviewing the risks and benefits,                            the patient was deemed in satisfactory condition to                            undergo the procedure.                           After obtaining  informed consent, the endoscope was                            passed under direct vision. Throughout the                            procedure, the patient's blood pressure, pulse, and                            oxygen saturations were monitored continuously. The                            [id number] scope was introduced through                            the mouth, and advanced to the antrum of the                            stomach. The [id number] scope was                            introduced through the mouth, and advanced to the                            antrum of the stomach. The upper EUS was                            accomplished without difficulty. The patient                            tolerated the procedure well. Scope In: Scope Out: Findings:      Endoscopic Finding :      A small mass was found at the gastroesophageal junction. The distal edge       of this mass directly abuts the Z line but does not appear to cross into       the stomach. The mass was non-obstructing and not circumferential.      Endosonographic Finding :      1. The GE junction mass described above correlates  with a hypoechoic and       heterogenous lesion. The lesion is non-circumferential and measures up       to 15 mm. There was sonographic evidence suggesting invasion into the       deep mucosa (Layer 2) with possible invasion into the submucosal (Layer       3). The gastric side of this mass was tattooed with an injection of       [place] ink.      2. No paraesophageal adenopathy. Impression:               - A 1.5cm, non-circumferential mass was found on                            the esophageal side of the GE junction. This was                            staged T1a (possible T1b) N0 M0 by endosonographic                            criteria.                           - He is a poor surgical candidate but this may be                            amenable to endoscopic  resection. Moderate Sedation:      N/A- Per Anesthesia Care Recommendation:           - Discharge patient to home (ambulatory).                           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 Procedure Code(s):        --- Professional ---                           [phone], 37, Esophagogastroduodenoscopy, flexible,                            transoral; with endoscopic ultrasound examination                            limited to the esophagus, stomach or duodenum, and                            adjacent structures Diagnosis Code(s):        --- Professional ---                           C16.0, Malignant neoplasm of cardia  K22.8, Other specified diseases of esophagus CPT copyright 2016 American Medical Association. All rights reserved. The codes documented in this report are preliminary and upon coder review may  be revised to meet current compliance requirements. Milus Banister, MD 03/09/2016 8:16:56 AM This report has been signed electronically. Number of Addenda: 0

## 2016-03-09 NOTE — Anesthesia Procedure Notes (Signed)
Procedure Name: MAC Performed by: Dione Booze Pre-anesthesia Checklist: Patient identified, Emergency Drugs available, Suction available and Patient being monitored Patient Re-evaluated:Patient Re-evaluated prior to inductionOxygen Delivery Method: Nasal cannula Placement Confirmation: positive ETCO2

## 2016-03-09 NOTE — Telephone Encounter (Signed)
-----   Message from Ladene Artist, MD sent at 03/09/2016  8:59 AM EST ----- Dorris Fetch, Yes please cancel his appt with you. Thanks. Norberto Sorenson   ----- Message ----- From: Stark Klein, MD Sent: 03/09/2016   8:55 AM To: Milus Banister, MD, Ladene Artist, MD, #  Sounds great.  So should I cancel his clinic appt for now? tx FB  ----- Message ----- From: Milus Banister, MD Sent: 03/09/2016   8:18 AM To: Ladene Artist, MD, Stark Klein, MD  Scherry Ran: Just completed EUS staging. See full note in EPIC.  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 He is a poor surgical candidate but this cancer may be amenable to endoscopic resection.  I think he should be referred to [facility] and others) to consider endoscopic resection.  Norberto Sorenson, can you let Teonia Yager know? would be for non-circumferential T1a vs T1b GE junction (esophagus side) adenocarcinoma in very poor surgical candidate.  Thanks all.  Radonna Ricker

## 2016-03-09 NOTE — Transfer of Care (Signed)
Immediate Anesthesia Transfer of Care Note  Patient: Mark Rivers  Procedure(s) Performed: Procedure(s): UPPER ENDOSCOPIC ULTRASOUND (EUS) RADIAL (N/A)  Patient Location: PACU and Endoscopy Unit  Anesthesia Type:MAC  Level of Consciousness: awake, alert  and patient cooperative  Airway & Oxygen Therapy: Patient Spontanous Breathing and Patient connected to nasal cannula oxygen  Post-op Assessment: Report given to RN and Post -op Vital signs reviewed and stable  Post vital signs: Reviewed and stable  Last Vitals:  Vitals:   03/09/16 0640  BP: 127/62  Pulse: 77  Resp: 19  Temp: 36.5 C    Last Pain:  Vitals:   03/09/16 0640  TempSrc: Oral         Complications: No apparent anesthesia complications

## 2016-03-09 NOTE — Telephone Encounter (Signed)
Referral faxed.  Patient notified that he will hear directly from [facility] and should expect a call in about a week

## 2016-03-09 NOTE — H&P (View-Only) (Signed)
02/22/2016 Mark Rivers [id number] 10-12-1934   HISTORY OF PRESENT ILLNESS:  This is an 82 year old male who is known to Dr. Fuller Plan.  Most recently was seen during a hospitalization earlier this month for anemia and heme-positive stools. Underwent endoscopy and was found to have an bleeding AVM that was cauterized, but also had a gastric nodule that was biopsied. This was found to be a primary invasive gastric adenocarcinoma.  He underwent CT of the chest, abdomen, and pelvis earlier today without any findings consistent with metastatic disease. He is already scheduled to see [facility] surgery tomorrow afternoon. He tells me that he has been feeling well. Denies seeing any black stools. He has persistent atrial fibrillation, but his Coumadin has been placed on hold.  Is on O2 for pulmonary HTN.   Past Medical History:  Diagnosis Date  . Allergy   . Back pain   . Chronic diastolic CHF (congestive heart failure) ([place]) 12/21/2015   A. Echo 10/17: Moderate LVH, EF 55-60, normal wall motion, grade 2 diastolic dysfunction, mild aortic stenosis (mean 14, peak 34), MAC, mild LAE    . CKD (chronic kidney disease) stage 3, GFR 30-59 ml/min 03/04/2014  . Coronary artery disease    a. s/p remote POBA in 1988 // b. NSTEMI in 10/17 >> LHC with 99% LM stenosis >> emergent CABG (L-LAD, S-OM2, S-PDA) - post op course complicated (prolonged ventilation, s/p Trach, AFib, anemia req transfusion, bacteremia >> DC to LTAC)  . Diabetes mellitus   . Difficult intubation    difficult airway/FYI note 11/01/2015  . GERD (gastroesophageal reflux disease)   . GI bleed   . History of nuclear stress test    a. Myoview 6/14 - EF 51, no ischemia or scar; Low Risk  . Hyperlipemia 09/11/2013   Last Assessment & Plan:  Lipid abnormalities are improving with treatment. Nutritional counseling was provided. and Pharmacotherapy as ordered. Lipids will be reassessed in 3 months.  . Hypertension   . Neuropathy ([place])   . On home  oxygen therapy    "2L; 24/7" (02/09/2016)  . Persistent atrial fibrillation ([name]) 10/25/2015   Past Surgical History:  Procedure Laterality Date  . CARDIAC CATHETERIZATION N/A 10/26/2015   Procedure: Left Heart Cath and Coronary Angiography;  Surgeon: Troy Sine, MD;  Location: [facility];  Service: Cardiovascular;  Laterality: N/A;  . CATARACT EXTRACTION W/ INTRAOCULAR LENS  IMPLANT, BILATERAL  2008  . COLONOSCOPY    . CORONARY ANGIOPLASTY WITH STENT PLACEMENT  1988  . CORONARY ARTERY BYPASS GRAFT N/A 10/26/2015   Procedure: CORONARY ARTERY BYPASS GRAFTING (CABG) x3(LIMA to LA, SVG to OM1, SVG to PDA) with EVH from the left thigh and partial lower leg greater saphenous vein and left internal mammary artery;  Surgeon: Ivin Poot, MD;  Location: [facility];  Service: Open Heart Surgery;  Laterality: N/A;  . ESOPHAGOGASTRODUODENOSCOPY (EGD) WITH PROPOFOL N/A 02/11/2016   Procedure: ESOPHAGOGASTRODUODENOSCOPY (EGD) WITH PROPOFOL;  Surgeon: Ladene Artist, MD;  Location: [facility];  Service: Endoscopy;  Laterality: N/A;  . TEE WITHOUT CARDIOVERSION N/A 10/26/2015   Procedure: TRANSESOPHAGEAL ECHOCARDIOGRAM (TEE);  Surgeon: Ivin Poot, MD;  Location: [facility];  Service: Open Heart Surgery;  Laterality: N/A;  . TONSILLECTOMY      reports that he quit smoking about 11 years ago. His smoking use included Cigarettes. He has a 60.00 pack-year smoking history. He has never used smokeless tobacco. He reports that he does not drink alcohol  or use drugs. family history includes Diabetes in his father. Allergies  Allergen Reactions  . Amoxicillin Other (See Comments)    Other reaction(s): Other (See Comments) PATIENT PASSES OUT!!!! Passes out PATIENT PASSES OUT!!!!  . Cephalexin Other (See Comments)    PATIENT PASSES OUT!!!! Passes out PATIENT PASSES OUT!!!!  . Penicillin G Other (See Comments)    Has patient had a PCN reaction causing immediate rash, facial/tongue/throat swelling,  SOB or lightheadedness with hypotension: Yes Has patient had a PCN reaction causing severe rash involving mucus membranes or skin necrosis: No Has patient had a PCN reaction that required hospitalization: Yes Has patient had a PCN reaction occurring within the last 10 years: Yes If all of the above answers are "NO", then may proceed with Cephalosporin use. PATIENT PASSES OUT!!!! Has patient had a PCN reaction causing immediate rash, facial/tongue/throat swelling, SOB or lightheadedness with hypotension: Yes Has patient had a PCN reaction causing severe rash involving mucus membranes or skin necrosis: No Has patient had a PCN reaction that required hospitalization: Yes Has patient had a PCN reaction occurring within the last 10 years: Yes If all of the above answers are "NO", then may proceed with Cephalosporin use. PATIENT PASSES OUT!!!!      Outpatient Encounter Prescriptions as of 02/22/2016  Medication Sig  . atorvastatin (LIPITOR) 80 MG tablet Take 1 tablet (80 mg total) by mouth daily.  . budesonide (PULMICORT) 0.5 MG/2ML nebulizer solution Take 0.5 mg by nebulization daily.   . cholecalciferol (VITAMIN D) 1000 units tablet Take 1,000 Units by mouth daily.  . fenofibrate 54 MG tablet Take 54 mg by mouth daily.  . Glucosamine-Chondroit-Vit C-Mn (GLUCOSAMINE CHONDR 1500 COMPLX PO) Take 2 tablets by mouth daily.   [name] loratadine (CLARITIN) 10 MG tablet Take 1 tablet (10 mg total) by mouth daily.  . metoprolol succinate (TOPROL-XL) 50 MG 24 hr tablet Take 1 tablet (50 mg total) by mouth daily. Take with or immediately following a meal.  . pantoprazole (PROTONIX) 40 MG tablet Take 1 tablet (40 mg total) by mouth daily at 6 (six) AM.  . TOUJEO SOLOSTAR 300 UNIT/ML SOPN Inject 30 Units into the skin every morning.   . traZODone (DESYREL) 100 MG tablet Take 100 mg by mouth at bedtime.   . [DISCONTINUED] oseltamivir (TAMIFLU) 30 MG capsule Take 1 capsule (30 mg total) by mouth 2 (two) times  daily.   No facility-administered encounter medications on file as of 02/22/2016.      REVIEW OF SYSTEMS  : All other systems reviewed and negative except where noted in the History of Present Illness.   PHYSICAL EXAM: BP 130/62   Pulse 68   Ht _0  (1.702 m)   Wt 190 lb 8 oz (86.4 kg)   BMI 29.84 kg/m  General: Well developed white male in no acute distress Head: Normocephalic and atraumatic Eyes:  Sclerae anicteric, conjunctiva pink. Ears: Normal auditory acuity Lungs: Clear throughout to auscultation Heart:  Irregularly irregular Abdomen: Soft, non-distended.  Normal bowel sounds.  Non-tender. Musculoskeletal: Symmetrical with no gross deformities  Skin: No lesions on visible extremities Extremities: No edema  Neurological: Alert oriented x 4, grossly non-focal Psychological:  Alert and cooperative. Normal mood and affect  ASSESSMENT AND PLAN: -Newly diagnosed gastric cancer:  Already had CT chest, abdomen, and pelvis.  Has appt with [facility] tomorrow.  Will schedule for upper EUS with Dr. [name]. -O2 dependent due to pulmonary HTN -Persistent atrial fibrillation, previously on coumadin which is on hold  for now  *The risks, benefits, and alternatives to upper EUS were discussed with the patient and he consents to proceed.   CC:  [name], MD

## 2016-03-13 ENCOUNTER — Encounter (HOSPITAL_COMMUNITY): Payer: Self-pay | Admitting: Gastroenterology

## 2016-03-27 ENCOUNTER — Institutional Professional Consult (permissible substitution): Payer: 59 | Admitting: Pulmonary Disease

## 2016-03-30 NOTE — Progress Notes (Addendum)
Cardiology Office Note:    Date:  03/31/2016   ID:  Mark Rivers, DOB [birth year], MRN [id number]  PCP:  [name]., MD  Cardiologist:Dr. Jenkins Rouge Electrophysiologist: n/a Nephrology: Dr. Olivia Mackie Phoebe Perch)  Referring MD: [name], MD   Chief Complaint  Patient presents with  . Coronary Artery Disease    History of Present Illness:    Mark Rivers is a 82 y.o. male with a hx of CAD, persistent AFib, DM2, CKD, HTN, HL, COPD.  He underwent remote angioplasty in 1988.  He was admitted in 10/17 with NSTEMI in the setting of AF with RVR.  He had critical LM stenosis on LHC and underwent emergent CABG (L-LAD, S-OM2, S-PDA).  He had a complex post op course notable for blood loss anemia requiring transfusion with PRBCs, difficulty in weaning from the ventilator resulting in tracheostomy, chest tube placement for R pleural effusion, MRSE bacteremia and AKI which limited diuresis.  He was placed on Coumadin for recurrent AF.  He was DC to [facility].    He was admitted 05/6385 with a/c diastolic CHF.  Last seen here in follow up 01/17/16. Placed on amiodarone with plans for [facility] in a few weeks  He was admitted again 1/31-2/4 with an upper GI bleed c/b AKI, influenza.  He required transfusion with PRBCs x 1.  His diuretics were held due to hypotension and AKI.  These were not resumed at DC.  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  Coumadin was held but resumed at DC.  He was taken off of ASA.  Of note, he had Digoxin added to his medical regimen at some point (prior to admission).    He returns for follow up.  The patient denies chest pain, He denies orthopnea, PND, edema.  He denies syncope. He has been off coumadin until esophageal issues resolved Still wearing oxygen needs  pulmonary f/u   Had EGD 03/09/16 Dr [name] and had adneocarcinoma Since he is a poor surgical candidate referred to [facility] for possible endoscopic Rx   Brother has moved from [place] to be with him since [name] has died and he has been very  Helpful   Prior CV studies:   The following studies were reviewed today:  Echo 01/11/16 Mod LVH, EF 55-60, inf AK, suspect mild aortic stenosis, mod MAC, mod LAE, mild reduced RVSF, mild RAE, PASP 47  Limited echo 11/09/15 EF 50-55, normal wall motion, ventricular septal dyssynergy, moderate MAC, mild MR, possible fibrinous linear projection originating from mitral annular calcification within LA  Complete echo 11/02/15 Moderate LVH, EF 55-60, normal wall motion, grade 2 diastolic dysfunction, mild aortic stenosis (mean 14, peak 34), MAC, mild LAE  LHC 10/26/15 LM distal 99 thrombotic LAD okay LCx okay RCA proximal 80, mid 30 EF 40-45  Myoview 10/16 EF 51, no ischemia or scar; Low Risk  Echo 6/14 Mild to moderate LVH, probable mild to moderately reduced LVEF, moderate LAE, MAC, mild MR, mild TR  Myoview 6/14 No ischemia, EF 49  Past Medical History:  Diagnosis Date  . Allergy   . Back pain   . Chronic diastolic CHF (congestive heart failure) ([place]) 12/21/2015   A. Echo 10/17: Moderate LVH, EF 55-60, normal wall motion, grade 2 diastolic dysfunction, mild aortic stenosis (mean 14, peak 34), MAC, mild LAE    .  CKD (chronic kidney disease) stage 3, GFR 30-59 ml/min 03/04/2014  . Coronary artery disease    a. s/p remote POBA in 1988 // b. NSTEMI in 10/17 >> LHC with 99% LM stenosis >> emergent CABG (L-LAD, S-OM2, S-PDA) - post op course complicated (prolonged ventilation, s/p Trach, AFib, anemia req transfusion, bacteremia >> DC to [facility])  . Diabetes mellitus   . Difficult intubation    difficult airway/FYI note 11/01/2015  . GERD (gastroesophageal reflux disease)   . GI bleed   . History of nuclear stress test    a. Myoview 6/14  - EF 51, no ischemia or scar; Low Risk  . Hyperlipemia 09/11/2013   Last Assessment & Plan:  Lipid abnormalities are improving with treatment. Nutritional counseling was provided. and Pharmacotherapy as ordered. Lipids will be reassessed in 3 months.  . Hypertension   . Neuropathy ([place])   . On home oxygen therapy    "2L; 24/7" (02/09/2016)  . Persistent atrial fibrillation ([place]) 10/25/2015    Past Surgical History:  Procedure Laterality Date  . CARDIAC CATHETERIZATION N/A 10/26/2015   Procedure: Left Heart Cath and Coronary Angiography;  Surgeon: Troy Sine, MD;  Location: [facility];  Service: Cardiovascular;  Laterality: N/A;  . CATARACT EXTRACTION W/ INTRAOCULAR LENS  IMPLANT, BILATERAL  2008  . COLONOSCOPY    . CORONARY ANGIOPLASTY WITH STENT PLACEMENT  1988  . CORONARY ARTERY BYPASS GRAFT N/A 10/26/2015   Procedure: CORONARY ARTERY BYPASS GRAFTING (CABG) x3(LIMA to LA, SVG to OM1, SVG to PDA) with EVH from the left thigh and partial lower leg greater saphenous vein and left internal mammary artery;  Surgeon: Ivin Poot, MD;  Location: [facility];  Service: Open Heart Surgery;  Laterality: N/A;  . ESOPHAGOGASTRODUODENOSCOPY (EGD) WITH PROPOFOL N/A 02/11/2016   Procedure: ESOPHAGOGASTRODUODENOSCOPY (EGD) WITH PROPOFOL;  Surgeon: Ladene Artist, MD;  Location: [facility]OSCOPY;  Service: Endoscopy;  Laterality: N/A;  . EUS N/A 03/09/2016   Procedure: UPPER ENDOSCOPIC ULTRASOUND (EUS) RADIAL;  Surgeon: Milus Banister, MD;  Location: [facility];  Service: Endoscopy;  Laterality: N/A;  . TEE WITHOUT CARDIOVERSION N/A 10/26/2015   Procedure: TRANSESOPHAGEAL ECHOCARDIOGRAM (TEE);  Surgeon: Ivin Poot, MD;  Location: [place];  Service: Open Heart Surgery;  Laterality: N/A;  . TONSILLECTOMY      Current Medications: Current Meds  Medication Sig  . aspirin EC 81 MG tablet Take 81 mg by mouth daily.  [name] atorvastatin (LIPITOR) 80 MG tablet Take 1 tablet (80 mg total) by mouth daily.    . cholecalciferol (VITAMIN D) 1000 units tablet Take 1,000 Units by mouth daily.  . diphenhydramine-acetaminophen (TYLENOL PM) 25-500 MG TABS tablet Take 2 tablets by mouth at bedtime.  . fenofibrate 54 MG tablet Take 54 mg by mouth daily.  . Glucosamine-Chondroit-Vit C-Mn (GLUCOSAMINE CHONDR 1500 COMPLX PO) Take 2 tablets by mouth daily.   [name] loratadine (CLARITIN) 10 MG tablet Take 1 tablet (10 mg total) by mouth daily.  . metoprolol succinate (TOPROL-XL) 50 MG 24 hr tablet Take 1 tablet (50 mg total) by mouth daily. Take with or immediately following a meal.  . pantoprazole (PROTONIX) 40 MG tablet Take 1 tablet (40 mg total) by mouth daily at 6 (six) AM.  . TOUJEO SOLOSTAR 300 UNIT/ML SOPN Inject 30 Units into the skin at bedtime.   . traZODone (DESYREL) 100 MG tablet Take 100 mg by mouth at bedtime.   . vitamin C (ASCORBIC ACID) 500 MG tablet Take 500 mg  by mouth daily.  . [DISCONTINUED] atorvastatin (LIPITOR) 80 MG tablet Take 80 mg by mouth daily.     Allergies:   Amoxicillin; Cephalexin; and Penicillin g   Social History   Social History  . Marital status: Widowed    Spouse name: N/A  . Number of children: N/A  . Years of education: N/A   Occupational History  . retired    Social History Main Topics  . Smoking status: Former Smoker    Packs/day: 2.00    Years: 30.00    Types: Cigarettes    Quit date: 06/28/2004  . Smokeless tobacco: Never Used  . Alcohol use No     Comment: QUIT 06/28/2004  . Drug use: No  . Sexual activity: Yes   Other Topics Concern  . None   Social History Narrative  . None     Family History  Problem Relation Age of Onset  . Diabetes Father   . Colon cancer Neg Hx   . Esophageal cancer Neg Hx   . Stomach cancer Neg Hx   . Rectal cancer Neg Hx   . Allergic rhinitis Neg Hx   . Angioedema Neg Hx   . Asthma Neg Hx   . Eczema Neg Hx   . Immunodeficiency Neg Hx   . Urticaria Neg Hx   . Liver disease Neg Hx      ROS:   Please see the  history of present illness.    Review of Systems  HENT: Positive for hearing loss.   Cardiovascular: Positive for irregular heartbeat.  Respiratory: Positive for cough and wheezing.    All other systems reviewed and are negative.   EKGs/Labs/Other Test Reviewed:    EKG:  02/16/16  AFib, HR 66  Recent Labs: 11/27/2015: TSH 2.639 01/09/2016: B Natriuretic Peptide 404.3 01/12/2016: Magnesium 2.1 02/09/2016: ALT 19 02/12/2016: Hemoglobin 9.6; Platelets 200 02/16/2016: BUN 28; Creatinine, Ser 1.43; Potassium 4.5; Sodium 145   Recent Lipid Panel    Component Value Date/Time   TRIG 268 (H) 11/05/2015 1325     Physical Exam:    VS:  BP 122/60   Pulse 71   Ht _0  (1.702 m)   Wt 199 lb 12.8 oz (90.6 kg)   SpO2 98%   BMI 31.29 kg/m     Wt Readings from Last 3 Encounters:  03/31/16 199 lb 12.8 oz (90.6 kg)  03/09/16 194 lb (88 kg)  02/22/16 190 lb 8 oz (86.4 kg)     Physical Exam  Constitutional: He is oriented to person, place, and time. He appears well-developed and well-nourished. No distress.  HENT:  Head: Normocephalic and atraumatic.  Eyes: No scleral icterus.  Neck: No JVD present.  Cardiovascular: Normal rate, S1 normal and S2 normal.  An irregularly irregular rhythm present.  No murmur heard. Pulmonary/Chest: He has decreased breath sounds. He has rhonchi. He has no rales.  Abdominal: Soft. He exhibits no distension.  Musculoskeletal: He exhibits no edema.  Neurological: He is alert and oriented to person, place, and time.  Skin: Skin is warm and dry.  Psychiatric: He has a normal mood and affect.    ASSESSMENT:    1. Coronary artery disease involving native coronary artery of native heart without angina pectoris   2. Chronic obstructive pulmonary disease, unspecified COPD type ([place])   3. History of home oxygen therapy    PLAN:    In order of problems listed above:  1. CAD - s/p NSTEMI in the setting of  AF with RVR in 10/17 followed by CABG.  He is now off  of ASA due to recent GI bleed.  He is no longer on nitrates due to hypotension. He denies angina.  Continue beta-blocker, statin.    2. Persistent AF - He remains in AF.  Rate control is fine No coumadin until issues with esophageal cancer resolved   3. Chronic diastolic CHF - Volume is stable.  He is off of Lasix.  We discussed the importance of daily weights and when to take Lasix.  4. CKD - His creatinine increased in the hospital.  He is due for FU with his nephrologist.  Check BMET today.  5. HTN - BP is controlled.  Adjust medications as noted.   6. COPD - f/u pulmonary on selective beta blocker left lung exam ? ILD wearing oxygen still Will arrange f/u with Dr Halford Chessman and pulmonary CXR ordered today given abnormal exam  7. Hx of GI bleed - with invasive esophageal cancer no coumadin has f/u at [facility] for possible endoscopic Rx procedure to be scheduled   Complicated situation but feel holding anticoagulation still in order Have personally called pulmonary to arrange f/u and discussed with Dr [name] plan for endoscopic intervention    Jenkins Rouge

## 2016-03-31 ENCOUNTER — Ambulatory Visit (INDEPENDENT_AMBULATORY_CARE_PROVIDER_SITE_OTHER)
Admission: RE | Admit: 2016-03-31 | Discharge: 2016-03-31 | Disposition: A | Discharge status: Home / Self Care | Payer: Medicare Other | Source: Ambulatory Visit | Attending: Cardiovascular Disease | Admitting: Cardiovascular Disease

## 2016-03-31 ENCOUNTER — Encounter: Payer: Self-pay | Admitting: Cardiovascular Disease

## 2016-03-31 ENCOUNTER — Ambulatory Visit (INDEPENDENT_AMBULATORY_CARE_PROVIDER_SITE_OTHER): Payer: Medicare Other | Admitting: Cardiovascular Disease

## 2016-03-31 VITALS — BP 122/60 | HR 71 | Ht 67.0 in | Wt 199.8 lb

## 2016-03-31 DIAGNOSIS — Z9981 Dependence on supplemental oxygen: Secondary | ICD-10-CM

## 2016-03-31 DIAGNOSIS — J449 Chronic obstructive pulmonary disease, unspecified: Secondary | ICD-10-CM | POA: Diagnosis not present

## 2016-03-31 DIAGNOSIS — I2510 Atherosclerotic heart disease of native coronary artery without angina pectoris: Secondary | ICD-10-CM

## 2016-03-31 MED ORDER — ATORVASTATIN CALCIUM 80 MG PO TABS: 80.0000 mg | ORAL_TABLET | Freq: Every day | ORAL | 3 refills | Status: AC

## 2016-03-31 NOTE — Patient Instructions (Addendum)
Medication Instructions:  Your physician recommends that you continue on your current medications as directed. Please refer to the Current Medication list given to you today.  Labwork: NONE  Testing/Procedures: A chest x-ray takes a picture of the organs and structures inside the chest, including the heart, lungs, and blood vessels. This test can show several things, including, whether the heart is enlarges; whether fluid is building up in the lungs; and whether pacemaker / defibrillator leads are still in place.  Follow-Up: Your physician wants you to follow-up in: 3 months with Dr. Johnsie Cancel.   You have been referred to Dr. Halford Chessman with pulmonology      If you need a refill on your cardiac medications before your next appointment, please call your pharmacy.

## 2016-04-05 ENCOUNTER — Telehealth: Payer: Self-pay | Admitting: *Deleted

## 2016-04-05 NOTE — Telephone Encounter (Signed)
Per Brynda Rim. PA I called the pt to verify if pt still using home O2. PA received paperwork from [place] in regards to Home O2.

## 2016-04-06 NOTE — Telephone Encounter (Signed)
Follow up      Calling to let the nurse know that he still uses home oxygen.

## 2016-04-06 NOTE — Telephone Encounter (Signed)
I left a detailed message on the pts VM stating that Mark Rivers and his nurse are out of the office today but that I am forwarding this message to his nurse and that if she has any questions she will call him back.

## 2016-04-07 NOTE — Telephone Encounter (Signed)
I received message that pt is still using Home O2. I will fax over to [place] the update ov 01/17/16 note showing pt is currently on Home O2 2 liters. I faxed the paper work over to [facility], [place] fax # [phone]. Pt chart has been updated to show he is on Home O2 2 liters .

## 2016-04-11 NOTE — Progress Notes (Signed)
Hi Pam,  Was checking to see if you contacted pt or the dr at [facility]. Looks like Dr. Johnsie Cancel sent this to both you and me. So I did not want to duplicate anything. Let me know if you did not contact pt or [facility] dr then I can when I am back in the office on Thursday.

## 2016-04-12 ENCOUNTER — Telehealth: Payer: Self-pay

## 2016-04-12 NOTE — Telephone Encounter (Signed)
Will fax to Dr. Aggie Hacker office with office visit note.

## 2016-04-12 NOTE — Telephone Encounter (Signed)
-----   Message from Josue Hector, MD sent at 04/10/2016  2:11 PM EDT ----- [employer] for endoscopy with mucosal resection with Dr Adria Devon at [facility]. He is off ASA and coumadin

## 2016-04-13 NOTE — Progress Notes (Signed)
Per Howie Ill, RN she faxed note over to Dr. Adria Devon at [facility].

## 2016-05-07 ENCOUNTER — Inpatient Hospital Stay (HOSPITAL_BASED_OUTPATIENT_CLINIC_OR_DEPARTMENT_OTHER)
Admission: EM | Admit: 2016-05-07 | Discharge: 2016-05-10 | DRG: 291 | Disposition: A | Discharge status: Home Health | Payer: Medicare Other | Attending: Internal Medicine | Admitting: Internal Medicine

## 2016-05-07 ENCOUNTER — Emergency Department (HOSPITAL_BASED_OUTPATIENT_CLINIC_OR_DEPARTMENT_OTHER): Payer: Medicare Other

## 2016-05-07 ENCOUNTER — Encounter (HOSPITAL_BASED_OUTPATIENT_CLINIC_OR_DEPARTMENT_OTHER): Payer: Self-pay | Admitting: Emergency Medicine

## 2016-05-07 ENCOUNTER — Inpatient Hospital Stay (HOSPITAL_COMMUNITY): Payer: Medicare Other

## 2016-05-07 DIAGNOSIS — Z9981 Dependence on supplemental oxygen: Secondary | ICD-10-CM | POA: Diagnosis not present

## 2016-05-07 DIAGNOSIS — Z951 Presence of aortocoronary bypass graft: Secondary | ICD-10-CM | POA: Diagnosis not present

## 2016-05-07 DIAGNOSIS — J449 Chronic obstructive pulmonary disease, unspecified: Secondary | ICD-10-CM | POA: Diagnosis present

## 2016-05-07 DIAGNOSIS — I5033 Acute on chronic diastolic (congestive) heart failure: Secondary | ICD-10-CM | POA: Diagnosis present

## 2016-05-07 DIAGNOSIS — E7800 Pure hypercholesterolemia, unspecified: Secondary | ICD-10-CM | POA: Diagnosis not present

## 2016-05-07 DIAGNOSIS — R42 Dizziness and giddiness: Secondary | ICD-10-CM

## 2016-05-07 DIAGNOSIS — I130 Hypertensive heart and chronic kidney disease with heart failure and stage 1 through stage 4 chronic kidney disease, or unspecified chronic kidney disease: Secondary | ICD-10-CM | POA: Diagnosis not present

## 2016-05-07 DIAGNOSIS — E876 Hypokalemia: Secondary | ICD-10-CM

## 2016-05-07 DIAGNOSIS — H81399 Other peripheral vertigo, unspecified ear: Secondary | ICD-10-CM | POA: Diagnosis present

## 2016-05-07 DIAGNOSIS — J9621 Acute and chronic respiratory failure with hypoxia: Secondary | ICD-10-CM | POA: Diagnosis present

## 2016-05-07 DIAGNOSIS — Z880 Allergy status to penicillin: Secondary | ICD-10-CM | POA: Diagnosis not present

## 2016-05-07 DIAGNOSIS — E1159 Type 2 diabetes mellitus with other circulatory complications: Secondary | ICD-10-CM

## 2016-05-07 DIAGNOSIS — I481 Persistent atrial fibrillation: Secondary | ICD-10-CM | POA: Diagnosis present

## 2016-05-07 DIAGNOSIS — I2510 Atherosclerotic heart disease of native coronary artery without angina pectoris: Secondary | ICD-10-CM | POA: Diagnosis present

## 2016-05-07 DIAGNOSIS — Z9842 Cataract extraction status, left eye: Secondary | ICD-10-CM | POA: Diagnosis not present

## 2016-05-07 DIAGNOSIS — Z9111 Patient's noncompliance with dietary regimen: Secondary | ICD-10-CM

## 2016-05-07 DIAGNOSIS — E1122 Type 2 diabetes mellitus with diabetic chronic kidney disease: Secondary | ICD-10-CM | POA: Diagnosis present

## 2016-05-07 DIAGNOSIS — R0600 Dyspnea, unspecified: Secondary | ICD-10-CM

## 2016-05-07 DIAGNOSIS — I5031 Acute diastolic (congestive) heart failure: Secondary | ICD-10-CM | POA: Diagnosis not present

## 2016-05-07 DIAGNOSIS — Z9841 Cataract extraction status, right eye: Secondary | ICD-10-CM

## 2016-05-07 DIAGNOSIS — Z87891 Personal history of nicotine dependence: Secondary | ICD-10-CM | POA: Diagnosis not present

## 2016-05-07 DIAGNOSIS — Z8673 Personal history of transient ischemic attack (TIA), and cerebral infarction without residual deficits: Secondary | ICD-10-CM

## 2016-05-07 DIAGNOSIS — E1140 Type 2 diabetes mellitus with diabetic neuropathy, unspecified: Secondary | ICD-10-CM | POA: Diagnosis present

## 2016-05-07 DIAGNOSIS — Z79899 Other long term (current) drug therapy: Secondary | ICD-10-CM

## 2016-05-07 DIAGNOSIS — I4819 Other persistent atrial fibrillation: Secondary | ICD-10-CM | POA: Diagnosis present

## 2016-05-07 DIAGNOSIS — Z794 Long term (current) use of insulin: Secondary | ICD-10-CM | POA: Diagnosis not present

## 2016-05-07 DIAGNOSIS — J9620 Acute and chronic respiratory failure, unspecified whether with hypoxia or hypercapnia: Secondary | ICD-10-CM | POA: Diagnosis present

## 2016-05-07 DIAGNOSIS — E785 Hyperlipidemia, unspecified: Secondary | ICD-10-CM | POA: Diagnosis present

## 2016-05-07 DIAGNOSIS — D500 Iron deficiency anemia secondary to blood loss (chronic): Secondary | ICD-10-CM | POA: Diagnosis present

## 2016-05-07 DIAGNOSIS — N183 Chronic kidney disease, stage 3 unspecified: Secondary | ICD-10-CM | POA: Diagnosis present

## 2016-05-07 DIAGNOSIS — I4891 Unspecified atrial fibrillation: Secondary | ICD-10-CM | POA: Diagnosis not present

## 2016-05-07 DIAGNOSIS — I10 Essential (primary) hypertension: Secondary | ICD-10-CM | POA: Diagnosis not present

## 2016-05-07 DIAGNOSIS — Z961 Presence of intraocular lens: Secondary | ICD-10-CM | POA: Diagnosis present

## 2016-05-07 DIAGNOSIS — R0602 Shortness of breath: Secondary | ICD-10-CM | POA: Diagnosis not present

## 2016-05-07 DIAGNOSIS — Z881 Allergy status to other antibiotic agents status: Secondary | ICD-10-CM

## 2016-05-07 DIAGNOSIS — R0609 Other forms of dyspnea: Secondary | ICD-10-CM

## 2016-05-07 DIAGNOSIS — I272 Pulmonary hypertension, unspecified: Secondary | ICD-10-CM | POA: Diagnosis present

## 2016-05-07 DIAGNOSIS — I509 Heart failure, unspecified: Secondary | ICD-10-CM

## 2016-05-07 DIAGNOSIS — E119 Type 2 diabetes mellitus without complications: Secondary | ICD-10-CM

## 2016-05-07 DIAGNOSIS — Z955 Presence of coronary angioplasty implant and graft: Secondary | ICD-10-CM | POA: Diagnosis not present

## 2016-05-07 DIAGNOSIS — C159 Malignant neoplasm of esophagus, unspecified: Secondary | ICD-10-CM | POA: Diagnosis present

## 2016-05-07 DIAGNOSIS — I252 Old myocardial infarction: Secondary | ICD-10-CM

## 2016-05-07 DIAGNOSIS — K219 Gastro-esophageal reflux disease without esophagitis: Secondary | ICD-10-CM | POA: Diagnosis present

## 2016-05-07 DIAGNOSIS — T502X5A Adverse effect of carbonic-anhydrase inhibitors, benzothiadiazides and other diuretics, initial encounter: Secondary | ICD-10-CM | POA: Diagnosis not present

## 2016-05-07 DIAGNOSIS — Z7982 Long term (current) use of aspirin: Secondary | ICD-10-CM

## 2016-05-07 HISTORY — DX: Bradycardia, unspecified: R00.1

## 2016-05-07 HISTORY — DX: Resistance to other single specified antibiotic: Z16.29

## 2016-05-07 HISTORY — DX: Other bacterial infections of unspecified site: A49.8

## 2016-05-07 HISTORY — DX: Cerebral infarction, unspecified: I63.9

## 2016-05-07 HISTORY — DX: Chronic respiratory failure, unspecified whether with hypoxia or hypercapnia: J96.10

## 2016-05-07 HISTORY — DX: Malignant neoplasm of esophagus, unspecified: C15.9

## 2016-05-07 LAB — CREATININE, SERUM
Creatinine, Ser: 1.46 mg/dL — ABNORMAL HIGH (ref 0.61–1.24)
GFR calc Af Amer: 51 mL/min — ABNORMAL LOW (ref >60)
GFR calc non Af Amer: 44 mL/min — ABNORMAL LOW (ref >60)

## 2016-05-07 LAB — CBC
HEMATOCRIT: 29.9 % — AB (ref 39.0–52.0)
HEMATOCRIT: 30.3 % — AB (ref 39.0–52.0)
HEMOGLOBIN: 9.2 g/dL — AB (ref 13.0–17.0)
Hemoglobin: 9.3 g/dL — ABNORMAL LOW (ref 13.0–17.0)
MCH: 29.1 pg (ref 26.0–34.0)
MCH: 27.7 pg (ref 26.0–34.0)
MCHC: 31.1 g/dL (ref 30.0–36.0)
MCHC: 30.4 g/dL (ref 30.0–36.0)
MCV: 93.4 fL (ref 78.0–100.0)
MCV: 91.3 fL (ref 78.0–100.0)
Platelets: 312 10*3/uL (ref 150–400)
Platelets: 284 10*3/uL (ref 150–400)
RBC: 3.2 MIL/uL — ABNORMAL LOW (ref 4.22–5.81)
RBC: 3.32 MIL/uL — ABNORMAL LOW (ref 4.22–5.81)
RDW: 15.6 % — AB (ref 11.5–15.5)
RDW: 15.7 % — AB (ref 11.5–15.5)
WBC: 9.5 10*3/uL (ref 4.0–10.5)
WBC: 11.7 10*3/uL — ABNORMAL HIGH (ref 4.0–10.5)

## 2016-05-07 LAB — COMPREHENSIVE METABOLIC PANEL
ALT: 22 U/L (ref 17–63)
AST: 33 U/L (ref 15–41)
Albumin: 3.6 g/dL (ref 3.5–5.0)
Alkaline Phosphatase: 64 U/L (ref 38–126)
Anion gap: 6 (ref 5–15)
BILIRUBIN TOTAL: 0.5 mg/dL (ref 0.3–1.2)
BUN: 35 mg/dL — AB (ref 6–20)
CO2: 27 mmol/L (ref 22–32)
Calcium: 8.8 mg/dL — ABNORMAL LOW (ref 8.9–10.3)
Chloride: 105 mmol/L (ref 101–111)
Creatinine, Ser: 1.37 mg/dL — ABNORMAL HIGH (ref 0.61–1.24)
GFR calc non Af Amer: 47 mL/min — ABNORMAL LOW (ref >60)
GFR, EST AFRICAN AMERICAN: 55 mL/min — AB (ref >60)
Glucose, Bld: 173 mg/dL — ABNORMAL HIGH (ref 65–99)
POTASSIUM: 4.1 mmol/L (ref 3.5–5.1)
Sodium: 138 mmol/L (ref 135–145)
TOTAL PROTEIN: 6.9 g/dL (ref 6.5–8.1)

## 2016-05-07 LAB — TROPONIN I
Troponin I: 0.04 ng/mL (ref <0.03)
Troponin I: 0.04 ng/mL (ref <0.03)

## 2016-05-07 LAB — GLUCOSE, CAPILLARY
Glucose-Capillary: 100 mg/dL — ABNORMAL HIGH (ref 65–99)
Glucose-Capillary: 128 mg/dL — ABNORMAL HIGH (ref 65–99)

## 2016-05-07 LAB — BRAIN NATRIURETIC PEPTIDE: B NATRIURETIC PEPTIDE 5: 1301.2 pg/mL — AB (ref 0.0–100.0)

## 2016-05-07 LAB — LIPASE, BLOOD: Lipase: 21 U/L (ref 11–51)

## 2016-05-07 MED ORDER — IPRATROPIUM BROMIDE 0.02 % IN SOLN
0.5000 mg | Freq: Once | RESPIRATORY_TRACT | Status: AC
  Administered 2016-05-07: 0.5 mg via RESPIRATORY_TRACT
  Filled 2016-05-07: qty 2.5

## 2016-05-07 MED ORDER — METOPROLOL SUCCINATE ER 50 MG PO TB24
50.0000 mg | ORAL_TABLET | Freq: Every day | ORAL | Status: DC
  Administered 2016-05-08 – 2016-05-10 (×3): 50 mg via ORAL
  Filled 2016-05-07 (×3): qty 1

## 2016-05-07 MED ORDER — LORATADINE 10 MG PO TABS
10.0000 mg | ORAL_TABLET | Freq: Every day | ORAL | Status: DC
  Administered 2016-05-08 – 2016-05-10 (×3): 10 mg via ORAL
  Filled 2016-05-07 (×3): qty 1

## 2016-05-07 MED ORDER — FUROSEMIDE 10 MG/ML IJ SOLN
30.0000 mg | Freq: Once | INTRAMUSCULAR | Status: AC
  Administered 2016-05-07: 30 mg via INTRAVENOUS
  Filled 2016-05-07: qty 4

## 2016-05-07 MED ORDER — TRAZODONE HCL 100 MG PO TABS
100.0000 mg | ORAL_TABLET | Freq: Every day | ORAL | Status: DC
  Administered 2016-05-07 – 2016-05-09 (×3): 100 mg via ORAL
  Filled 2016-05-07 (×3): qty 1

## 2016-05-07 MED ORDER — ENOXAPARIN SODIUM 40 MG/0.4ML ~~LOC~~ SOLN
40.0000 mg | SUBCUTANEOUS | Status: DC
  Administered 2016-05-07 – 2016-05-09 (×3): 40 mg via SUBCUTANEOUS
  Filled 2016-05-07 (×3): qty 0.4

## 2016-05-07 MED ORDER — FENOFIBRATE 54 MG PO TABS
54.0000 mg | ORAL_TABLET | Freq: Every day | ORAL | Status: DC
  Administered 2016-05-08 – 2016-05-10 (×3): 54 mg via ORAL
  Filled 2016-05-07 (×3): qty 1

## 2016-05-07 MED ORDER — ATORVASTATIN CALCIUM 80 MG PO TABS
80.0000 mg | ORAL_TABLET | Freq: Every day | ORAL | Status: DC
  Administered 2016-05-08 – 2016-05-10 (×3): 80 mg via ORAL
  Filled 2016-05-07 (×3): qty 1

## 2016-05-07 MED ORDER — PANTOPRAZOLE SODIUM 40 MG PO TBEC
40.0000 mg | DELAYED_RELEASE_TABLET | Freq: Every day | ORAL | Status: DC
  Administered 2016-05-08 – 2016-05-10 (×3): 40 mg via ORAL
  Filled 2016-05-07 (×3): qty 1

## 2016-05-07 MED ORDER — INSULIN GLARGINE 100 UNIT/ML ~~LOC~~ SOLN
20.0000 [IU] | Freq: Every day | SUBCUTANEOUS | Status: DC
  Administered 2016-05-08: 20 [IU] via SUBCUTANEOUS
  Filled 2016-05-07 (×3): qty 0.2

## 2016-05-07 MED ORDER — ALBUTEROL (5 MG/ML) CONTINUOUS INHALATION SOLN
10.0000 mg/h | INHALATION_SOLUTION | Freq: Once | RESPIRATORY_TRACT | Status: AC
  Administered 2016-05-07: 10 mg/h via RESPIRATORY_TRACT
  Filled 2016-05-07: qty 20

## 2016-05-07 MED ORDER — FUROSEMIDE 10 MG/ML IJ SOLN
40.0000 mg | Freq: Two times a day (BID) | INTRAMUSCULAR | Status: DC
  Administered 2016-05-08: 40 mg via INTRAVENOUS
  Filled 2016-05-07: qty 4

## 2016-05-07 MED ORDER — ASPIRIN 81 MG PO CHEW
324.0000 mg | CHEWABLE_TABLET | Freq: Once | ORAL | Status: AC
  Administered 2016-05-07: 324 mg via ORAL
  Filled 2016-05-07: qty 4

## 2016-05-07 MED ORDER — FUROSEMIDE 10 MG/ML IJ SOLN
20.0000 mg | Freq: Once | INTRAMUSCULAR | Status: AC
  Administered 2016-05-07: 20 mg via INTRAVENOUS
  Filled 2016-05-07: qty 2

## 2016-05-07 MED ORDER — MORPHINE SULFATE (PF) 2 MG/ML IV SOLN: 2.0000 mg | INTRAVENOUS | Status: DC | PRN

## 2016-05-07 MED ORDER — ASPIRIN 325 MG PO TABS
325.0000 mg | ORAL_TABLET | Freq: Every day | ORAL | Status: DC
  Administered 2016-05-08 – 2016-05-10 (×3): 325 mg via ORAL
  Filled 2016-05-07 (×3): qty 1

## 2016-05-07 MED ORDER — ACETAMINOPHEN 325 MG PO TABS
650.0000 mg | ORAL_TABLET | ORAL | Status: DC | PRN
  Administered 2016-05-07 – 2016-05-09 (×4): 650 mg via ORAL
  Filled 2016-05-07 (×4): qty 2

## 2016-05-07 MED ORDER — HYDRALAZINE HCL 20 MG/ML IJ SOLN: 10.0000 mg | Freq: Three times a day (TID) | INTRAMUSCULAR | Status: DC | PRN

## 2016-05-07 MED ORDER — ALBUTEROL SULFATE (2.5 MG/3ML) 0.083% IN NEBU: 2.5000 mg | INHALATION_SOLUTION | RESPIRATORY_TRACT | Status: DC | PRN

## 2016-05-07 MED ORDER — ONDANSETRON HCL 4 MG/2ML IJ SOLN: 4.0000 mg | Freq: Four times a day (QID) | INTRAMUSCULAR | Status: DC | PRN

## 2016-05-07 MED ORDER — INSULIN ASPART 100 UNIT/ML ~~LOC~~ SOLN
0.0000 [IU] | Freq: Three times a day (TID) | SUBCUTANEOUS | Status: DC
  Administered 2016-05-08 – 2016-05-09 (×2): 3 [IU] via SUBCUTANEOUS

## 2016-05-07 NOTE — ED Notes (Signed)
ED Provider at bedside. 

## 2016-05-07 NOTE — ED Notes (Signed)
Pt able to stand and pivot to bed from wheelchair without assistance.

## 2016-05-07 NOTE — Progress Notes (Signed)
New pt admission from [facility]. Pt brought to the floor in stable condition. Vitals taken. Initial Assessment done. All immediate pertinent needs to patient addressed. Patient Guide given to patient. Important safety instructions relating to hospitalization reviewed with patient. Patient verbalized understanding. Will continue to monitor pt. 

## 2016-05-07 NOTE — Plan of Care (Signed)
Diagnosis: congestive heart failure, COPD ae, ataxia  Past medical history: atrial fibrillation, neuropathy, hypertension, hyperlipidemia, GI bleed, reflux, difficult intubation, diabetes, coronary artery disease, CKD, heart failure, stomach cancer followed by [facility], chronic resp failure on 2L  HPI: Came in with several weeks of DOE and orthopnea. Pt has a walker at baseline. Feels he is having trouble with balance. EDP felt pt has COPD ae with mixed pulm edema. EDP concerned for possible posterior stroke,pos romberg. Pt not on anticoag for afib currently.  Requesting admit and MRI  Ordered: ASA 324mg  po, lasix 20mg  IV  Status: tele, inpt  Elwin Mocha MD

## 2016-05-07 NOTE — ED Provider Notes (Signed)
[facility] DEPT Provider Note   CSN: [id number] Arrival date & time: 05/07/16  0919     History   Chief Complaint Chief Complaint  Patient presents with  . Shortness of Breath    HPI Mark Rivers is a 82 y.o. male with a h/o diastolic CHF, COPD, Afib w/ no anticoagulation, adenocarcinoma of the esophagus who presents to the Emergency Department with acute onset dizziness that began last night at 22:00. He reports the dizziness has been constant since onset and is worse with standing and walking. He states that he feels as if the room is spinning. No lightheadedness, HA, numbness/ tingling, or facial droop. No fever or chills. He also reports progressively worsening dyspnea over the last two weeks. He reports he normally sleeps with one pillow, but was feeling more short of breath last night so he elevated the head of his bed to be more upright. He also presents with mild bilateral lower leg swelling over the last week. No warmth, redness, or streaking. He states that he normally is able to walk out to the backyard twice a day to take care of his animals, but was feeling so poorly last night he was unable to walk to the backyard. He also complains of a cough with clear productive sputum, no change from baseline. No weakness, dyspnea, syncope, chest pain, N/V/D, abdominal pain, hematemesis, otalgia, or visual disturbances.   The history is provided by the patient. No language interpreter was used.    Past Medical History:  Diagnosis Date  . Allergy   . Back pain   . Chronic diastolic CHF (congestive heart failure) ([place]) 12/21/2015   A. Echo 10/17: Moderate LVH, EF 55-60, normal wall motion, grade 2 diastolic dysfunction, mild aortic stenosis (mean 14, peak 34), MAC, mild LAE    . Chronic respiratory failure ([place])   . CKD (chronic kidney disease) stage 3, GFR 30-59 ml/min 03/04/2014  . Coronary artery disease    a. s/p remote POBA in 1988 // b. NSTEMI in 10/17 >> LHC with 99% LM  stenosis >> emergent CABG (L-LAD, S-OM2, S-PDA) - post op course complicated (prolonged ventilation, s/p Trach, AFib, anemia req transfusion, bacteremia >> DC to LTAC)  . Diabetes mellitus   . Difficult intubation    difficult airway/FYI note 11/01/2015  . Esophageal adenocarcinoma ([place])    a. found by EGD 03/2016..  . GERD (gastroesophageal reflux disease)   . GI bleed 01/2016   a. 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 F/u EGD with esophageal adenocarcinoma 03/2016.  [name] History of nuclear stress test    a. Myoview 6/14 - EF 51, no ischemia or scar; Low Risk  . Hyperlipemia   . Hypertension   . Methicillin resistant Staphylococcus epidermidis infection 11/2015   a. during adm for CABG.  . Neuropathy   . On home oxygen therapy    "2L; 24/7" (02/09/2016)  . Persistent atrial fibrillation ([place]) 10/25/2015  . Sinus bradycardia    a. Prior HR 54 when in sinus rhythm 11/2015.  . Stroke [facility])    a. old L pontine infarcts seen on CT 04/2016.    Patient Active Problem List   Diagnosis Date Noted  . Hypokalemia 05/08/2016  . Vertigo   . Acute on chronic respiratory failure ([place]) 05/07/2016  . 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 Malignant neoplasm of stomach (  [place]) 02/22/2016  . Upper GI bleed   . Duodenitis determined by biopsy   . Panlobular emphysema ([place])   . Essential hypertension   . Controlled diabetes mellitus type 2 with complications ([place])   . Influenza A 02/11/2016  . Duodenitis:Per EGD 02/11/2016 02/11/2016  . Warfarin-induced coagulopathy ([place])   . Malnutrition of moderate degree 02/10/2016  . Symptomatic anemia 02/09/2016  . GI bleed 02/09/2016  . Monitoring for long-term anticoagulant use 12/24/2015  . Acute on chronic diastolic heart failure ([place]) 12/21/2015  . Pressure injury of skin 11/17/2015  .  Surgery, elective   . Coronary artery disease involving native coronary artery of native heart without angina pectoris 10/27/2015  . S/P CABG x 3   . History of MI (myocardial infarction)   . Persistent atrial fibrillation ([place]) 10/25/2015  . Seasonal allergic conjunctivitis 08/25/2015  . History of tobacco abuse 08/25/2015  . Diabetes mellitus ([place]) 03/11/2015  . Diabetes mellitus with stage 3 chronic kidney disease ([place]) 03/11/2015  . Metabolic bone disease 02/77/4128  . Vitamin D deficiency 03/11/2015  . Atopic dermatitis 02/24/2015  . Hip pain 09/24/2014  . Need for immunization against influenza 09/24/2014  . Benign essential hypertension 03/13/2014  . Generalized osteoarthritis of multiple sites 03/13/2014  . CKD (chronic kidney disease) stage 3, GFR 30-59 ml/min 03/04/2014  . Acute stress disorder 11/12/2013  . Seasonal and perennial allergic rhinitis 11/12/2013  . Allergic urticaria 11/12/2013  . Benign neoplasm of colon 11/12/2013  . Causalgia of upper extremity 11/12/2013  . Cervical radiculopathy 11/12/2013  . Neck pain 11/12/2013  . Chronic fatigue 11/12/2013  . Hearing loss 11/12/2013  . Insomnia 11/12/2013  . Obesity 11/12/2013  . Premature ventricular contractions 11/12/2013  . Psychosexual dysfunction with inhibited sexual excitement 11/12/2013  . Hyperlipemia 09/11/2013  . Type 2 diabetes mellitus, uncontrolled ([place]) 09/11/2013  . Pulmonary emphysema ([place]) 04/22/2013  . Esophageal reflux   . Benign hypertension with chronic kidney disease, stage III   . Allergy   . Neuropathy [facility])     Past Surgical History:  Procedure Laterality Date  . CARDIAC CATHETERIZATION N/A 10/26/2015   Procedure: Left Heart Cath and Coronary Angiography;  Surgeon: Troy Sine, MD;  Location: [facility] CV LAB;  Service: Cardiovascular;  Laterality: N/A;  . CATARACT EXTRACTION W/ INTRAOCULAR LENS  IMPLANT, BILATERAL  2008  . COLONOSCOPY    . CORONARY ANGIOPLASTY WITH STENT  PLACEMENT  1988  . CORONARY ARTERY BYPASS GRAFT N/A 10/26/2015   Procedure: CORONARY ARTERY BYPASS GRAFTING (CABG) x3(LIMA to LA, SVG to OM1, SVG to PDA) with EVH from the left thigh and partial lower leg greater saphenous vein and left internal mammary artery;  Surgeon: Ivin Poot, MD;  Location: [facility];  Service: Open Heart Surgery;  Laterality: N/A;  . ESOPHAGOGASTRODUODENOSCOPY (EGD) WITH PROPOFOL N/A 02/11/2016   Procedure: ESOPHAGOGASTRODUODENOSCOPY (EGD) WITH PROPOFOL;  Surgeon: Ladene Artist, MD;  Location: [facility] END[facility]COPY;  Service: Endoscopy;  Laterality: N/A;  . EUS N/A 03/09/2016   Procedure: UPPER ENDOSCOPIC ULTRASOUND (EUS) RADIAL;  Surgeon: Milus Banister, MD;  Location: [facility] ENDOSCOPY;  Service: Endoscopy;  Laterality: N/A;  . TEE WITHOUT CARDIOVERSION N/A 10/26/2015   Procedure: TRANSESOPHAGEAL ECHOCARDIOGRAM (TEE);  Surgeon: Ivin Poot, MD;  Location: [facility];  Service: Open Heart Surgery;  Laterality: N/A;  . TONSILLECTOMY         Home Medications    Prior to Admission medications   Medication Sig Start Date End Date Taking? Authorizing Provider  aspirin EC 81 MG tablet Take 81 mg by mouth daily.   Yes Historical Provider, MD  atorvastatin (LIPITOR) 80 MG tablet Take 1 tablet (80 mg total) by mouth daily. 03/31/16  Yes Josue Hector, MD  cholecalciferol (VITAMIN D) 1000 units tablet Take 1,000 Units by mouth daily.   Yes Historical Provider, MD  diphenhydramine-acetaminophen (TYLENOL PM) 25-500 MG TABS tablet Take 2 tablets by mouth at bedtime.   Yes Historical Provider, MD  fenofibrate 54 MG tablet Take 54 mg by mouth daily.   Yes Historical Provider, MD  Glucosamine-Chondroit-Vit C-Mn (GLUCOSAMINE CHONDR 1500 COMPLX PO) Take 2 tablets by mouth daily.    Yes Historical Provider, MD  Insulin Glargine (TOUJEO SOLOSTAR) 300 UNIT/ML SOPN Inject 30 Units into the skin at bedtime.   Yes Historical Provider, MD  loratadine (CLARITIN) 10 MG tablet Take 1 tablet (10 mg total) by  mouth daily. 02/14/16  Yes Allie Bossier, MD  metoprolol succinate (TOPROL-XL) 50 MG 24 hr tablet Take 1 tablet (50 mg total) by mouth daily. Take with or immediately following a meal. 02/16/16 05/16/16 Yes Scott T Weaver, PA-C  OXYGEN Inhale 2 L/min into the lungs continuous.   Yes Historical Provider, MD  pantoprazole (PROTONIX) 40 MG tablet Take 1 tablet (40 mg total) by mouth daily at 6 (six) AM. Patient taking differently: Take 40 mg by mouth 2 (two) times daily.  02/14/16  Yes Allie Bossier, MD  traZODone (DESYREL) 100 MG tablet Take 100 mg by mouth at bedtime.    Yes Historical Provider, MD  vitamin C (ASCORBIC ACID) 500 MG tablet Take 500 mg by mouth daily.   Yes Historical Provider, MD  furosemide (LASIX) 40 MG tablet Take 1 tablet (40 mg total) by mouth daily. 05/10/16   Shanker Kristeen Mans, MD  potassium chloride SA (K-DUR,KLOR-CON) 20 MEQ tablet Take 2 tablets (40 mEq total) by mouth daily. 05/10/16   Shanker Kristeen Mans, MD    Family History Family History  Problem Relation Age of Onset  . Diabetes Father   . Colon cancer Neg Hx   . Esophageal cancer Neg Hx   . Stomach cancer Neg Hx   . Rectal cancer Neg Hx   . Allergic rhinitis Neg Hx   . Angioedema Neg Hx   . Asthma Neg Hx   . Eczema Neg Hx   . Immunodeficiency Neg Hx   . Urticaria Neg Hx   . Liver disease Neg Hx     Social History Social History  Substance Use Topics  . Smoking status: Former Smoker    Packs/day: 2.00    Years: 30.00    Types: Cigarettes    Quit date: 06/28/2004  . Smokeless tobacco: Never Used  . Alcohol use No     Comment: QUIT 06/28/2004     Allergies   Amoxicillin; Cephalexin; and Penicillin g   Review of Systems Review of Systems  Constitutional: Positive for activity change and fatigue. Negative for chills, diaphoresis and fever.  HENT: Negative for congestion, hearing loss and sore throat.   Eyes: Negative for visual disturbance.  Respiratory: Positive for cough (chronic) and shortness of  breath. Negative for chest tightness.   Cardiovascular: Positive for leg swelling. Negative for chest pain.  Gastrointestinal: Negative for abdominal pain, diarrhea, nausea and vomiting.  Genitourinary: Negative for dysuria.  Musculoskeletal: Negative for arthralgias, back pain and myalgias.  Skin: Negative for rash and wound.  Allergic/Immunologic: Positive for immunocompromised state.  Neurological: Positive for dizziness. Negative  for syncope, facial asymmetry, weakness, light-headedness, numbness and headaches.  Psychiatric/Behavioral: Negative for confusion.     Physical Exam Updated Vital Signs BP 127/82 (BP Location: Left Arm)   Pulse 75   Temp 97.5 F (36.4 C) (Oral)   Resp 18   Ht _0  (1.702 m)   Wt 86.1 kg   SpO2 97%   BMI 29.73 kg/m   Physical Exam  Constitutional: He appears well-developed and well-nourished.  HENT:  Head: Normocephalic and atraumatic.  Right Ear: Tympanic membrane normal.  Left Ear: Tympanic membrane normal.  Nose: Nose normal.  Mouth/Throat: Uvula is midline. Mucous membranes are dry.  Eyes: Conjunctivae and EOM are normal. Pupils are equal, round, and reactive to light. Right conjunctiva is not injected. Left conjunctiva is not injected.  No nystagmus.   Neck: Neck supple. No JVD present.  Cardiovascular: Normal rate and normal heart sounds.  An irregularly irregular rhythm present.  No murmur heard. Pulses:      Radial pulses are 2+ on the right side, and 2+ on the left side.       Dorsalis pedis pulses are 2+ on the right side, and 1+ on the left side.       Posterior tibial pulses are 2+ on the right side, and 1+ on the left side.  Pulmonary/Chest: Effort normal and breath sounds normal. No respiratory distress. He has no wheezes. He has no rales.  Scattered expiratory wheezes bilaterally.   Abdominal: Soft. Bowel sounds are normal. He exhibits no distension. There is no tenderness. There is no guarding.  Obese abdomen.     Musculoskeletal: He exhibits no edema, tenderness or deformity.  Neurological: He is alert. GCS eye subscore is 4. GCS verbal subscore is 5. GCS motor subscore is 6.  DTRs 2+ and equal. Normal heel to shin and finger to nose. CN II-XII intact. Patient answering questions appropriately with goal-oriented sentences. Negative Romberg. Normal gait, but patient is easily winded. 5/5 motor strength of the bilateral and lower extremities. No sensory deficits.   Skin: Skin is warm and dry. Capillary refill takes less than 2 seconds. No abrasion, no bruising, no ecchymosis and no petechiae noted.  Psychiatric: He has a normal mood and affect. His speech is normal and behavior is normal. Judgment and thought content normal.  Nursing note and vitals reviewed.   ED Treatments / Results  Labs (all labs ordered are listed, but only abnormal results are displayed) Labs Reviewed  CBC - Abnormal; Notable for the following:       Result Value   RBC 3.20 (*)    Hemoglobin 9.3 (*)    HCT 29.9 (*)    RDW 15.6 (*)    All other components within normal limits  COMPREHENSIVE METABOLIC PANEL - Abnormal; Notable for the following:    Glucose, Bld 173 (*)    BUN 35 (*)    Creatinine, Ser 1.37 (*)    Calcium 8.8 (*)    GFR calc non Af Amer 47 (*)    GFR calc Af Amer 55 (*)    All other components within normal limits  BRAIN NATRIURETIC PEPTIDE - Abnormal; Notable for the following:    B Natriuretic Peptide 1,301.2 (*)    All other components within normal limits  TROPONIN I - Abnormal; Notable for the following:    Troponin I 0.04 (*)    All other components within normal limits  CBC - Abnormal; Notable for the following:    WBC 11.7 (*)  RBC 3.32 (*)    Hemoglobin 9.2 (*)    HCT 30.3 (*)    RDW 15.7 (*)    All other components within normal limits  CREATININE, SERUM - Abnormal; Notable for the following:    Creatinine, Ser 1.46 (*)    GFR calc non Af Amer 44 (*)    GFR calc Af Amer 51 (*)    All  other components within normal limits  GLUCOSE, CAPILLARY - Abnormal; Notable for the following:    Glucose-Capillary 100 (*)    All other components within normal limits  TROPONIN I - Abnormal; Notable for the following:    Troponin I 0.04 (*)    All other components within normal limits  TROPONIN I - Abnormal; Notable for the following:    Troponin I 0.05 (*)    All other components within normal limits  TROPONIN I - Abnormal; Notable for the following:    Troponin I 0.06 (*)    All other components within normal limits  BASIC METABOLIC PANEL - Abnormal; Notable for the following:    Potassium 3.4 (*)    BUN 30 (*)    Creatinine, Ser 1.53 (*)    GFR calc non Af Amer 41 (*)    GFR calc Af Amer 48 (*)    All other components within normal limits  GLUCOSE, CAPILLARY - Abnormal; Notable for the following:    Glucose-Capillary 128 (*)    All other components within normal limits  GLUCOSE, CAPILLARY - Abnormal; Notable for the following:    Glucose-Capillary 137 (*)    All other components within normal limits  D-DIMER, QUANTITATIVE (NOT AT [facility]) - Abnormal; Notable for the following:    D-Dimer, Quant 1.62 (*)    All other components within normal limits  GLUCOSE, CAPILLARY - Abnormal; Notable for the following:    Glucose-Capillary 113 (*)    All other components within normal limits  BASIC METABOLIC PANEL - Abnormal; Notable for the following:    Chloride 100 (*)    BUN 31 (*)    Creatinine, Ser 1.60 (*)    GFR calc non Af Amer 39 (*)    GFR calc Af Amer 45 (*)    All other components within normal limits  CBC - Abnormal; Notable for the following:    WBC 12.2 (*)    RBC 3.31 (*)    Hemoglobin 9.0 (*)    HCT 30.1 (*)    MCHC 29.9 (*)    RDW 15.6 (*)    All other components within normal limits  GLUCOSE, CAPILLARY - Abnormal; Notable for the following:    Glucose-Capillary 149 (*)    All other components within normal limits  GLUCOSE, CAPILLARY - Abnormal; Notable for  the following:    Glucose-Capillary 139 (*)    All other components within normal limits  GLUCOSE, CAPILLARY - Abnormal; Notable for the following:    Glucose-Capillary 113 (*)    All other components within normal limits  BASIC METABOLIC PANEL - Abnormal; Notable for the following:    Chloride 98 (*)    CO2 33 (*)    BUN 33 (*)    Creatinine, Ser 1.69 (*)    GFR calc non Af Amer 37 (*)    GFR calc Af Amer 42 (*)    All other components within normal limits  GLUCOSE, CAPILLARY - Abnormal; Notable for the following:    Glucose-Capillary 117 (*)    All other components within normal  limits  GLUCOSE, CAPILLARY - Abnormal; Notable for the following:    Glucose-Capillary 100 (*)    All other components within normal limits  LIPASE, BLOOD  GLUCOSE, CAPILLARY  GLUCOSE, CAPILLARY    EKG  EKG Interpretation  Date/Time:  _69  year old male with fatigue and dyspnea on exertion worsening over 2 weeks and new vertiginous symptoms that began last night. Scattered wheezes and mild non-pitting edema noted on the bilateral lower extremities. No focal deficits on neuro exam. No aphasia. No nystagmus. VAN negative. CT head with no signs of obvious metastatic lesions or stroke. TMs appear normal. Labs reveal elevated BNP, Initial troponin and EKG unremarkable. Pulmonary vascular congestion noted on CXR. Likely mixed acute on chronic CHF and COPD exacerbation. Nebulizer treatments provided in the ED with noted improvement. Admitted to hospitalist service for continued workup and management.   Final Clinical Impressions(s) / ED Diagnoses   Final diagnoses:  Vertigo  Dyspnea  Dyspnea on exertion    New Prescriptions    Joanne Gavel, PA-C 05/10/16 [place], MD 05/12/16 [phone]

## 2016-05-07 NOTE — Progress Notes (Signed)
Echocardiogram 2D Echocardiogram has been performed.  Joelene Millin 05/07/2016, 5:44 PM

## 2016-05-07 NOTE — H&P (Signed)
[facility] History and Physical  Mark Rivers UMP:[id number] DOB: Apr 26, 1934 DOA: 05/07/2016  Referring physician:  PCP: Mark Rivers., MD   Chief Complaint: "I just got to the point where I couldn't go across the room."  HPI: Mark Rivers is a 82 y.o. male  past mental history significant for hyperlipidemia, hypertension, neuropathy, chronic respiratory failure, CK D, coronary artery disease, diabetes who presents with dyspnea on exertion and orthopnea. Patient states that he's had dyspnea on exertion for about the last 3 weeks getting worse. Orthopnea over the last 2 weeks. Patient became very alarmed when he had trouble walking with his walker today. Patient normally does not use his walker. Today he was so unbalanced he had to use it. Patient states at one point he collapsed onto the couch. After several hours patient went to the emergency room for evaluation.  ED course: CT head negative. Chest x-ray showed pulmonary vascular congestion.   Review of Systems:  As per HPI otherwise 10 point review of systems negative.    Past Medical History:  Diagnosis Date  . Allergy   . Back pain   . Chronic diastolic CHF (congestive heart failure) ([place]) 12/21/2015   A. Echo 10/17: Moderate LVH, EF 55-60, normal wall motion, grade 2 diastolic dysfunction, mild aortic stenosis (mean 14, peak 34), MAC, mild LAE    . CKD (chronic kidney disease) stage 3, GFR 30-59 ml/min 03/04/2014  . Coronary artery disease    a. s/p remote POBA in 1988 // b. NSTEMI in 10/17 >> LHC with 99% LM stenosis >> emergent CABG (L-LAD, S-OM2, S-PDA) - post op course complicated (prolonged ventilation, s/p Trach, AFib, anemia req transfusion, bacteremia >> DC to LTAC)  . Diabetes mellitus   . Difficult intubation    difficult airway/FYI note 11/01/2015  . GERD (gastroesophageal reflux disease)   . GI bleed   . History of nuclear stress test    a. Myoview 6/14 - EF 51, no ischemia or scar; Low Risk  .  Hyperlipemia 09/11/2013   Last Assessment & Plan:  Lipid abnormalities are improving with treatment. Nutritional counseling was provided. and Pharmacotherapy as ordered. Lipids will be reassessed in 3 months.  . Hypertension   . Neuropathy   . On home oxygen therapy    "2L; 24/7" (02/09/2016)  . Persistent atrial fibrillation ([place]) 10/25/2015   Past Surgical History:  Procedure Laterality Date  . CARDIAC CATHETERIZATION N/A 10/26/2015   Procedure: Left Heart Cath and Coronary Angiography;  Surgeon: Troy Sine, MD;  Location: [facility];  Service: Cardiovascular;  Laterality: N/A;  . CATARACT EXTRACTION W/ INTRAOCULAR LENS  IMPLANT, BILATERAL  2008  . COLONOSCOPY    . CORONARY ANGIOPLASTY WITH STENT PLACEMENT  1988  . CORONARY ARTERY BYPASS GRAFT N/A 10/26/2015   Procedure: CORONARY ARTERY BYPASS GRAFTING (CABG) x3(LIMA to LA, SVG to OM1, SVG to PDA) with EVH from the left thigh and partial lower leg greater saphenous vein and left internal mammary artery;  Surgeon: Ivin Poot, MD;  Location: [facility];  Service: Open Heart Surgery;  Laterality: N/A;  . ESOPHAGOGASTRODUODENOSCOPY (EGD) WITH PROPOFOL N/A 02/11/2016   Procedure: ESOPHAGOGASTRODUODENOSCOPY (EGD) WITH PROPOFOL;  Surgeon: Ladene Artist, MD;  Location: [facility];  Service: Endoscopy;  Laterality: N/A;  . EUS N/A 03/09/2016   Procedure: UPPER ENDOSCOPIC ULTRASOUND (EUS) RADIAL;  Surgeon: Milus Banister, MD;  Location: [facility];  Service: Endoscopy;  Laterality: N/A;  . TEE WITHOUT CARDIOVERSION N/A 10/26/2015   Procedure:  TRANSESOPHAGEAL ECHOCARDIOGRAM (TEE);  Surgeon: Ivin Poot, MD;  Location: [facility];  Service: Open Heart Surgery;  Laterality: N/A;  . TONSILLECTOMY     Social History:  reports that he quit smoking about 11 years ago. His smoking use included Cigarettes. He has a 60.00 pack-year smoking history. He has never used smokeless tobacco. He reports that he does not drink alcohol or use  drugs.  Allergies  Allergen Reactions  . Amoxicillin Other (See Comments)    PATIENT PASSED OUT   . Cephalexin Other (See Comments)    PATIENT PASSED OUT  . Penicillin G Other (See Comments)    PATIENT PASSED OUT. Has patient had a PCN reaction causing immediate rash, facial/tongue/throat swelling, SOB or lightheadedness with hypotension: Yes Has patient had a PCN reaction causing severe rash involving mucus membranes or skin necrosis: No Has patient had a PCN reaction that required hospitalization: Yes Has patient had a PCN reaction occurring within the last 10 years: Yes If all of the above answers are "NO", then may proceed with Cephalosporin    Family History  Problem Relation Age of Onset  . Diabetes Father   . Colon cancer Neg Hx   . Esophageal cancer Neg Hx   . Stomach cancer Neg Hx   . Rectal cancer Neg Hx   . Allergic rhinitis Neg Hx   . Angioedema Neg Hx   . Asthma Neg Hx   . Eczema Neg Hx   . Immunodeficiency Neg Hx   . Urticaria Neg Hx   . Liver disease Neg Hx      Prior to Admission medications   Medication Sig Start Date End Date Taking? Authorizing Provider  aspirin EC 81 MG tablet Take 81 mg by mouth daily.   Yes Historical Provider, MD  atorvastatin (LIPITOR) 80 MG tablet Take 1 tablet (80 mg total) by mouth daily. 03/31/16  Yes Josue Hector, MD  cholecalciferol (VITAMIN D) 1000 units tablet Take 1,000 Units by mouth daily.   Yes Historical Provider, MD  diphenhydramine-acetaminophen (TYLENOL PM) 25-500 MG TABS tablet Take 2 tablets by mouth at bedtime.   Yes Historical Provider, MD  fenofibrate 54 MG tablet Take 54 mg by mouth daily.   Yes Historical Provider, MD  Glucosamine-Chondroit-Vit C-Mn (GLUCOSAMINE CHONDR 1500 COMPLX PO) Take 2 tablets by mouth daily.    Yes Historical Provider, MD  Insulin Glargine (TOUJEO SOLOSTAR) 300 UNIT/ML SOPN Inject 30 Units into the skin at bedtime.   Yes Historical Provider, MD  loratadine (CLARITIN) 10 MG tablet Take 1  tablet (10 mg total) by mouth daily. 02/14/16  Yes Allie Bossier, MD  metoprolol succinate (TOPROL-XL) 50 MG 24 hr tablet Take 1 tablet (50 mg total) by mouth daily. Take with or immediately following a meal. 02/16/16 05/16/16 Yes Scott T Weaver, PA-C  OXYGEN Inhale 2 L/min into the lungs continuous.   Yes Historical Provider, MD  pantoprazole (PROTONIX) 40 MG tablet Take 1 tablet (40 mg total) by mouth daily at 6 (six) AM. Patient taking differently: Take 40 mg by mouth 2 (two) times daily.  02/14/16  Yes Allie Bossier, MD  traZODone (DESYREL) 100 MG tablet Take 100 mg by mouth at bedtime.    Yes Historical Provider, MD  vitamin C (ASCORBIC ACID) 500 MG tablet Take 500 mg by mouth daily.   Yes Historical Provider, MD   Physical Exam: Vitals:   05/07/16 1219 05/07/16 1230 05/07/16 1417 05/07/16 1500  BP:  124/78 135/69  Pulse: (!) 117 (!) 116 79   Resp: 20 (!) 21 20   Temp:   97.6 F (36.4 C)   TempSrc:   Oral   SpO2: 100% 96% 96% 96%  Weight:   92.9 kg (204 lb 12.9 oz)   Height:   _0  (1.702 m)     Wt Readings from Last 3 Encounters:  05/07/16 92.9 kg (204 lb 12.9 oz)  03/31/16 90.6 kg (199 lb 12.8 oz)  03/09/16 88 kg (194 lb)    General:  Appears calm and comfortable, Alert and oriented 3 Eyes:  PERRL, EOMI, normal lids, iris, smooth pursuit no nystagmus or strabismus, no amblyopia ENT:  grossly normal hearing, lips & tongue Neck:  no LAD, masses or thyromegaly Cardiovascular:  RRR, no m/r/g. No LE edema.  Respiratory:  CTA bilaterally, no w/r/r. Normal respiratory effort. [place] 2L. Abdomen:  soft, ntnd Skin:  no rash or induration seen on limited exam Musculoskeletal:  grossly normal tone BUE/BLE Psychiatric:  grossly normal mood and affect, speech fluent and appropriate Neurologic:  CN 2-12 grossly intact, moves all extremities in coordinated fashion.          Labs on Admission:  Basic Metabolic Panel:  Recent Labs Lab 05/07/16 0950 05/07/16 1454  NA 138  --   K 4.1   --   CL 105  --   CO2 27  --   GLUCOSE 173*  --   BUN 35*  --   CREATININE 1.37* 1.46*  CALCIUM 8.8*  --    Liver Function Tests:  Recent Labs Lab 05/07/16 0950  AST 33  ALT 22  ALKPHOS 64  BILITOT 0.5  PROT 6.9  ALBUMIN 3.6    Recent Labs Lab 05/07/16 0950  LIPASE 21   No results for input(s): AMMONIA in the last 168 hours. CBC:  Recent Labs Lab 05/07/16 0950 05/07/16 1454  WBC 9.5 11.7*  HGB 9.3* 9.2*  HCT 29.9* 30.3*  MCV 93.4 91.3  PLT 312 284   Cardiac Enzymes:  Recent Labs Lab 05/07/16 0950  TROPONINI 0.04*    BNP (last 3 results)  Recent Labs  10/26/15 0137 01/09/16 1445 05/07/16 0950  BNP 225.2* 404.3* 1,301.2*    ProBNP (last 3 results) No results for input(s): PROBNP in the last 8760 hours.   Serum creatinine: 1.46 mg/dL (H) 05/07/16 1454 Estimated creatinine clearance: 43.8 mL/min (A)  CBG:  Recent Labs Lab 05/07/16 1634  GLUCAP 100*    Radiological Exams on Admission: Dg Chest 2 View  Result Date: 05/07/2016 CLINICAL DATA:  Shortness of breath and dizziness EXAM: CHEST  2 VIEW COMPARISON:  March 31, 2016 FINDINGS: There is a small pleural effusion on the left. There is no edema or consolidation. Heart is mildly enlarged with pulmonary venous hypertension. No adenopathy. Patient is status post coronary artery bypass grafting. IMPRESSION: Pulmonary vascular congestion. No frank edema or consolidation. Small left pleural effusion. Electronically Signed   By: Lowella Grip III M.D.   On: 05/07/2016 10:51   Ct Head Wo Contrast  Result Date: 05/07/2016 CLINICAL DATA:  Dizziness began yesterday.  Shortness of breath. EXAM: CT HEAD WITHOUT CONTRAST TECHNIQUE: Contiguous axial images were obtained from the base of the skull through the vertex without intravenous contrast. COMPARISON:  CT head 04/10/2003. FINDINGS: Brain: No evidence for acute infarction, hemorrhage, mass lesion, hydrocephalus, or extra-axial fluid. Moderate  cerebral and cerebellar atrophy, has progressed since 2005. Hypoattenuation of white matter favored to represent small vessel disease. Vascular:  Advanced vascular calcification in the carotid siphons. No proximal hyperdense vessel to suggest emergent vascular occlusion. Skull: Normal. Negative for fracture or focal lesion. Sinuses/Orbits: No acute finding. Other: None. IMPRESSION: Atrophy and small vessel disease have progressed since 2005. There are no acute intracranial findings observed. Electronically Signed   By: Staci Righter M.D.   On: 05/07/2016 10:53    EKG: Independently reviewed. NO stemi.  Assessment/Plan Active Problems:   Diabetes mellitus ([place])   Hyperlipemia   Acute on chronic respiratory failure (H[facility])  Acute on chronic respiratory failure Likely secondary to fluid overload Gentle diuresis Strict I's and O's follow troponins Check x-ray in the morning Echo tomorrow  Ataxia Patient demonstrates no ataxia my exam Stroke scale score 0 Neuro checks for now MRI MRA ordered Further workup if MRI MRA is positive  COPD Prn albuterol  GERD Cont PPI  Hyperlipidemia Continue statin  CKD Monitor Cr daily Cr at baseline,  1.3-1.4  Code Status: FULL DVT Prophylaxis: lovenox Family Communication: none available son in [place] Mr. Helsen Disposition Plan: Pending Improvement  Status: obs, tele  Elwin Mocha, MD Family Medicine [facility] [email or web address] Password [email or web address]

## 2016-05-07 NOTE — ED Notes (Signed)
Lab notified of add on

## 2016-05-07 NOTE — ED Provider Notes (Signed)
Medical screening examination/treatment/procedure(s) were conducted as a shared visit with non-physician practitioner(s) and myself.  I personally evaluated the patient during the encounter. Briefly, the patient is a 82 y.o. male with an extensive past medical history including hypertension, diabetes, COPD, A. fib that is rate controlled but is currently not on any anticoagulation, diastolic heart failure. He presents with several weeks of fatigue, worsening dyspnea on exertion and orthopnea. He also presents with 12 hours of sudden onset dizziness described as feeling off balance  1. Fatigue, dyspnea on exertion Workup consistent with CHF exacerbation without obvious pulmonary edema. Patient also has wheezing on exam consistent with COPD exacerbation. Provided with breathing treatments in the emergency department. Will require admission for continued diuresis.   2. Dizziness Vertiginous symptoms. Given patient's history of A. fib who is currently not anticoagulated, there is a concern for possible posterior stroke. Patient out of the window for cold stroke. Exam without any focal neurologic deficits however does have a positive Romberg and has some mild ataxia. VAN negative. CT head without evidence of obvious metastatic disease or remote stroke. Will require MRI to assess for possible stroke versus metastatic disease given the patient's history of stomach cancer.  Admitted to hospitalist service for continued workup and management.    EKG Interpretation  Date/Time:  Sunday May 07 2016 09:31:54 EDT Ventricular Rate:  92 PR Interval:    QRS Duration: 97 QT Interval:  407 QTC Calculation: 504 R Axis:   82 Text Interpretation:  Atrial fibrillation Borderline right axis deviation Borderline repolarization abnormality Prolonged QT interval new T wave changes in V5 and V6 Confirmed by [facility] MD, Bobby Ragan ([id number]) on 05/07/2016 9:38:10 AM           Fatima Blank, MD 05/07/16 1203

## 2016-05-07 NOTE — Progress Notes (Signed)
Troponin 0.04, previous was 0.04, no s/s, MD notified, will continue to monitor, Thanks MIke F RN

## 2016-05-07 NOTE — ED Triage Notes (Addendum)
Patient reports dizziness which began last night and shortness of breath for 2 weeks.  Reports he wears 2L oxygen via [place] at all times but did not bring it with him on arrival to ER. Patient 94% on RA.  Denies fevers, chest pain.  Patient alert and oriented at present states and able to answer questions but is dyspneic at rest. Reports recently had GI bleed and cardiologist took him off of his medications for atrial fibrillation due to bleeding approximately 1 month ago.

## 2016-05-07 NOTE — ED Notes (Signed)
Date and time results received: 05/07/16  (use smartphrase ".now" to insert current time)  Test: Troponin Critical Value: 0.04  Name of Provider Notified: [place]ama, and [place], RN  Orders Received? Or Actions Taken?: No new orders received.

## 2016-05-07 NOTE — ED Notes (Signed)
Patient in xray 

## 2016-05-07 NOTE — ED Notes (Signed)
Pt in wheelchair. 

## 2016-05-07 NOTE — ED Notes (Signed)
EDP at the bedside.  Notified of HR.  Will continue to monitor.

## 2016-05-08 ENCOUNTER — Encounter (HOSPITAL_COMMUNITY): Payer: Self-pay | Admitting: Physician Assistant

## 2016-05-08 ENCOUNTER — Inpatient Hospital Stay (HOSPITAL_COMMUNITY): Payer: Medicare Other

## 2016-05-08 DIAGNOSIS — I481 Persistent atrial fibrillation: Secondary | ICD-10-CM

## 2016-05-08 DIAGNOSIS — N183 Chronic kidney disease, stage 3 (moderate): Secondary | ICD-10-CM

## 2016-05-08 DIAGNOSIS — R42 Dizziness and giddiness: Secondary | ICD-10-CM

## 2016-05-08 DIAGNOSIS — I10 Essential (primary) hypertension: Secondary | ICD-10-CM

## 2016-05-08 DIAGNOSIS — I2510 Atherosclerotic heart disease of native coronary artery without angina pectoris: Secondary | ICD-10-CM

## 2016-05-08 DIAGNOSIS — J9620 Acute and chronic respiratory failure, unspecified whether with hypoxia or hypercapnia: Secondary | ICD-10-CM

## 2016-05-08 DIAGNOSIS — I5033 Acute on chronic diastolic (congestive) heart failure: Secondary | ICD-10-CM

## 2016-05-08 DIAGNOSIS — E876 Hypokalemia: Secondary | ICD-10-CM

## 2016-05-08 LAB — BASIC METABOLIC PANEL
Anion gap: 8 (ref 5–15)
BUN: 30 mg/dL — ABNORMAL HIGH (ref 6–20)
CALCIUM: 9 mg/dL (ref 8.9–10.3)
CO2: 29 mmol/L (ref 22–32)
CREATININE: 1.53 mg/dL — AB (ref 0.61–1.24)
Chloride: 104 mmol/L (ref 101–111)
GFR, EST AFRICAN AMERICAN: 48 mL/min — AB (ref >60)
GFR, EST NON AFRICAN AMERICAN: 41 mL/min — AB (ref >60)
Glucose, Bld: 80 mg/dL (ref 65–99)
Potassium: 3.4 mmol/L — ABNORMAL LOW (ref 3.5–5.1)
Sodium: 141 mmol/L (ref 135–145)

## 2016-05-08 LAB — GLUCOSE, CAPILLARY
GLUCOSE-CAPILLARY: 137 mg/dL — AB (ref 65–99)
Glucose-Capillary: 78 mg/dL (ref 65–99)
Glucose-Capillary: 113 mg/dL — ABNORMAL HIGH (ref 65–99)
Glucose-Capillary: 149 mg/dL — ABNORMAL HIGH (ref 65–99)

## 2016-05-08 LAB — TROPONIN I
TROPONIN I: 0.05 ng/mL — AB (ref <0.03)
TROPONIN I: 0.06 ng/mL — AB (ref <0.03)

## 2016-05-08 LAB — ECHOCARDIOGRAM LIMITED
HEIGHTINCHES: 67 in
Weight: 3276.92 oz

## 2016-05-08 LAB — D-DIMER, QUANTITATIVE: D-Dimer, Quant: 1.62 ug/mL-FEU — ABNORMAL HIGH (ref 0.00–0.50)

## 2016-05-08 MED ORDER — FUROSEMIDE 10 MG/ML IJ SOLN: 40.0000 mg | Freq: Every day | INTRAMUSCULAR | Status: DC

## 2016-05-08 MED ORDER — MECLIZINE HCL 25 MG PO TABS: 12.5000 mg | ORAL_TABLET | Freq: Three times a day (TID) | ORAL | Status: DC | PRN

## 2016-05-08 MED ORDER — POTASSIUM CHLORIDE CRYS ER 20 MEQ PO TBCR
40.0000 meq | EXTENDED_RELEASE_TABLET | Freq: Every day | ORAL | Status: DC
  Administered 2016-05-08 – 2016-05-10 (×3): 40 meq via ORAL
  Filled 2016-05-08 (×3): qty 2

## 2016-05-08 MED ORDER — FUROSEMIDE 10 MG/ML IJ SOLN
40.0000 mg | Freq: Two times a day (BID) | INTRAMUSCULAR | Status: DC
  Administered 2016-05-08 – 2016-05-09 (×2): 40 mg via INTRAVENOUS
  Filled 2016-05-08 (×2): qty 4

## 2016-05-08 NOTE — Progress Notes (Signed)
[facility]                                                                              Patient Demographics  Mark Rivers, is a 82 y.o. male, DOB - Nov 27, 1934, WNI:[id number]  Admit date - 05/07/2016   Admitting Physician Elwin Mocha, MD  Outpatient Primary MD for the patient is [name]., MD  Outpatient specialists:   LOS - 1  days    Chief Complaint  Patient presents with  . Shortness of Breath       Brief summary   Patient is a 82 year old male with hypertension, hyperlipidemia, neuropathy, chronic respiratory failure on 2 L O2 via nasal cannula, CKD, CAD, diabetes presented with the dyspnea on exertion and orthopnea red per patient, he had dyspnea on exertion for last 3 weeks, orthopnea for last 2 weeks, also reported symptoms of vertigo and trouble with balance. Chest x-ray showed pulmonary vascular congestion. CT head was negative. Patient was admitted for acute CHF exacerbation. MRI ruled out any acute infarct.  Assessment & Plan    Principal Problem:   Acute on chronic respiratory failure (HCC) Possibly multifactorial due to acute on chronic diastolic CHF exacerbation, COPD, atrial fibrillation. - BNP elevated to 1301, troponin trending up 0.06, creatinine trending up to 1.53, chest x-ray showed pulmonary vascular congestion - Cardiology consulted, currently on IV Lasix 40 mg every 12 hours, creatinine trending up, monitor closely - Strict I's and O's and daily weights, negative balance of 2.8 L  - 2-D echo on 4/29 showed EF of 55-60%, left atrium severely dilated, moderately dilated right atrium, pulmonary hypertension with PA pressure of 55 mmHg - Follow d-dimer, if positive may need VQ scan  Active Problems:   CKD (chronic kidney disease) stage 3, GFR 30-59 ml/min - Baseline creatinine 1.4-1.8, currently 1.53 likely due to IV diuresis - On aggressive IV Lasix will follow cardiology recommendations    Diabetes mellitus ([place]) - CBGs  controlled, continue Lantus, sliding scale insulin    Hyperlipemia - Continue Lipitor, fenofibrate  Dizziness, likely vertigo  - MRI of the brain/MRA negative for acute infarct - Placed on meclizine as needed, PT evaluation for vertigo - Resolved    Persistent atrial fibrillation ([place]) with RVR - CHADSVASC 8, currently on Toprol-XL 50 mg daily - Not on anticoagulation, follow cardiology recommendations regarding rate control    Coronary artery disease involving native coronary artery of native heart without angina pectoris - Slightly elevated troponins, follow cardiology recommendations - Continue aspirin, Lipitor, beta blocker    Essential hypertension - Continue Toprol-XL, Lasix    Hypokalemia - likely due to diuresis, replaced  Code Status:Full CODE STATUS  DVT Prophylaxis:  Lovenox  Family Communication: Discussed in detail with the patient, all imaging results, lab results explained to the patient   Disposition Plan:   Time Spent in minutes   25 minutes  Procedures:  2-D echo: Impressions:  - Normal LV systolic function; moderate LVH; calcified aortic valve   with mild AS and trace AI; mild MR; biatrial enlargement; mild TR   with moderately elevated pulmonary pressure.   Consultants:   Cardiology  Antimicrobials:   None   Medications  Scheduled Meds: . aspirin  325 mg Oral Daily  . atorvastatin  80 mg Oral Daily  . enoxaparin (LOVENOX) injection  40 mg Subcutaneous Q24H  . fenofibrate  54 mg Oral Daily  . furosemide  40 mg Intravenous BID  . insulin aspart  0-20 Units Subcutaneous TID WC  . insulin glargine  20 Units Subcutaneous QHS  . loratadine  10 mg Oral Daily  . metoprolol succinate  50 mg Oral Daily  . pantoprazole  40 mg Oral Q0600  . potassium chloride  40 mEq Oral Daily  . traZODone  100 mg Oral QHS   Continuous Infusions: PRN Meds:.acetaminophen, albuterol, hydrALAZINE, morphine injection, ondansetron (ZOFRAN) IV   Antibiotics    Anti-infectives    None        Subjective:   Mark Rivers was seen and examined today. Feeling somewhat better today, shortness of breath is improving however has not ambulated. Heart rate is not well controlled. Patient denies dizziness, abdominal pain, N/V/D/C, new weakness, numbess, tingling. No acute events overnight.  Currently no chest pain.  Objective:   Vitals:   05/07/16 2051 05/07/16 2356 05/08/16 0428 05/08/16 1010  BP: (!) 148/86 133/79 125/80 (!) 147/78  Pulse: 75 78 85 (!) 122  Resp:  18 18   Temp: 97.8 F (36.6 C) 98.6 F (37 C) 97.7 F (36.5 C)   TempSrc: Oral Oral Oral   SpO2: 97% 96% 93%   Weight:   93 kg (205 lb 1.6 oz)   Height:        Intake/Output Summary (Last 24 hours) at 05/08/16 1137 Last data filed at 05/08/16 1040  Gross per 24 hour  Intake              660 ml  Output             3500 ml  Net            -2840 ml     Wt Readings from Last 3 Encounters:  05/08/16 93 kg (205 lb 1.6 oz)  03/31/16 90.6 kg (199 lb 12.8 oz)  03/09/16 88 kg (194 lb)     Exam  General: Alert and oriented x 3, NAD  HEENT:    Neck: Supple, no JVD  Cardiovascular: Irregularly irregular, tachycardia  Respiratory: Bibasilar crackles  Gastrointestinal: Soft, nontender, nondistended, + bowel sounds  Ext: no cyanosis clubbing, trace edema  Neuro: AAOx3, Cr N's II- XII. Strength 5/5 upper and lower extremities bilaterally  Skin: No rashes  Psych: Normal affect and demeanor, alert and oriented x3    Data Reviewed:  I have personally reviewed following labs and imaging studies  Micro Results No results found for this or any previous visit (from the past 240 hour(s)).  Radiology Reports Dg Chest 2 View  Result Date: 05/07/2016 CLINICAL DATA:  Shortness of breath and dizziness EXAM: CHEST  2 VIEW COMPARISON:  March 31, 2016 FINDINGS: There is a small pleural effusion on the left. There is no edema or consolidation. Heart is mildly enlarged with  pulmonary venous hypertension. No adenopathy. Patient is status post coronary artery bypass grafting. IMPRESSION: Pulmonary vascular congestion. No frank edema or consolidation. Small left pleural effusion. Electronically Signed   By: Lowella Grip III M.D.   On: 05/07/2016 10:51   Ct Head Wo Contrast  Result Date: 05/07/2016 CLINICAL DATA:  Dizziness began yesterday.  Shortness of breath. EXAM: CT HEAD WITHOUT CONTRAST TECHNIQUE: Contiguous axial images  were obtained from the base of the skull through the vertex without intravenous contrast. COMPARISON:  CT head 04/10/2003. FINDINGS: Brain: No evidence for acute infarction, hemorrhage, mass lesion, hydrocephalus, or extra-axial fluid. Moderate cerebral and cerebellar atrophy, has progressed since 2005. Hypoattenuation of white matter favored to represent small vessel disease. Vascular: Advanced vascular calcification in the carotid siphons. No proximal hyperdense vessel to suggest emergent vascular occlusion. Skull: Normal. Negative for fracture or focal lesion. Sinuses/Orbits: No acute finding. Other: None. IMPRESSION: Atrophy and small vessel disease have progressed since 2005. There are no acute intracranial findings observed. Electronically Signed   By: Staci Righter M.D.   On: 05/07/2016 10:53   Mr Jodene Nam Head Wo Contrast  Result Date: 05/08/2016 CLINICAL DATA:  Vertigo, dyspnea on exertion. History of hypertension, hyperlipidemia, diabetes, atrial fibrillation. EXAM: MRI HEAD WITHOUT CONTRAST MRA HEAD WITHOUT CONTRAST TECHNIQUE: Multiplanar, multiecho pulse sequences of the brain and surrounding structures were obtained without intravenous contrast. Angiographic images of the head were obtained using MRA technique without contrast. COMPARISON:  CT HEAD May 07, 2016 FINDINGS: MRI HEAD FINDINGS BRAIN: No reduced diffusion to suggest acute ischemia. No susceptibility artifact to suggest hemorrhage. The ventricles and sulci are normal for patient's  age. 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 Patchy supratentorial white matter FLAIR T2 hyperintensities. No suspicious parenchymal signal, masses or mass effect. No abnormal extra-axial fluid collections. VASCULAR: Attenuated RIGHT distal V4 segment, see below. SKULL AND UPPER CERVICAL SPINE: No abnormal sellar expansion. No suspicious calvarial bone marrow signal. Craniocervical junction maintained. SINUSES/ORBITS: Minimal paranasal sinus mucosal thickening. Mastoid air cells are well aerated. The included ocular globes and orbital contents are non-suspicious. Status post bilateral ocular lens implants. OTHER: None. MRA HEAD FINDINGS- mild motion degraded examination. ANTERIOR CIRCULATION: Normal flow related enhancement of the included cervical, petrous, cavernous and supraclinoid internal carotid arteries. Patent anterior communicating artery. Normal flow related enhancement of the anterior and middle cerebral arteries, including distal segments. No large vessel occlusion, high-grade stenosis, abnormal luminal irregularity, aneurysm. POSTERIOR CIRCULATION: Loss of RIGHT vertebral artery flow related enhancement distal to the RIGHT posterior-inferior cerebellar artery territory origin. Basilar artery is patent, with normal flow related enhancement of the main branch vessels. Normal flow related enhancement of the posterior cerebral arteries. Fetal origin RIGHT posterior cerebral artery. Robust LEFT posterior communicating artery present. No large vessel occlusion, high-grade stenosis, abnormal luminal irregularity, aneurysm. ANATOMIC VARIANTS: None. IMPRESSION: MRI HEAD: No acute intracranial process. Moderate white matter changes.  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 MRA HEAD: Slow flow versus occluded RIGHT vertebral artery distal to PICA origin. Complete circle of Willis. Electronically Signed   By: Elon Alas M.D.   On: 05/08/2016 06:16   Mr Brain Wo Contrast  Result Date: 05/08/2016 CLINICAL DATA:  Vertigo, dyspnea on  exertion. History of hypertension, hyperlipidemia, diabetes, atrial fibrillation. EXAM: MRI HEAD WITHOUT CONTRAST MRA HEAD WITHOUT CONTRAST TECHNIQUE: Multiplanar, multiecho pulse sequences of the brain and surrounding structures were obtained without intravenous contrast. Angiographic images of the head were obtained using MRA technique without contrast. COMPARISON:  CT HEAD May 07, 2016 FINDINGS: MRI HEAD FINDINGS BRAIN: No reduced diffusion to suggest acute ischemia. No susceptibility artifact to suggest hemorrhage. The ventricles and sulci are normal for patient's age. 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 Patchy supratentorial white matter FLAIR T2 hyperintensities. No suspicious parenchymal signal, masses or mass effect. No abnormal extra-axial fluid collections. VASCULAR: Attenuated RIGHT distal V4 segment, see below. SKULL AND UPPER CERVICAL SPINE: No abnormal sellar expansion. No suspicious calvarial bone marrow signal. Craniocervical  junction maintained. SINUSES/ORBITS: Minimal paranasal sinus mucosal thickening. Mastoid air cells are well aerated. The included ocular globes and orbital contents are non-suspicious. Status post bilateral ocular lens implants. OTHER: None. MRA HEAD FINDINGS- mild motion degraded examination. ANTERIOR CIRCULATION: Normal flow related enhancement of the included cervical, petrous, cavernous and supraclinoid internal carotid arteries. Patent anterior communicating artery. Normal flow related enhancement of the anterior and middle cerebral arteries, including distal segments. No large vessel occlusion, high-grade stenosis, abnormal luminal irregularity, aneurysm. POSTERIOR CIRCULATION: Loss of RIGHT vertebral artery flow related enhancement distal to the RIGHT posterior-inferior cerebellar artery territory origin. Basilar artery is patent, with normal flow related enhancement of the main branch vessels. Normal flow related enhancement of the posterior cerebral arteries. Fetal origin  RIGHT posterior cerebral artery. Robust LEFT posterior communicating artery present. No large vessel occlusion, high-grade stenosis, abnormal luminal irregularity, aneurysm. ANATOMIC VARIANTS: None. IMPRESSION: MRI HEAD: No acute intracranial process. Moderate white matter changes.  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 MRA HEAD: Slow flow versus occluded RIGHT vertebral artery distal to PICA origin. Complete circle of Willis. Electronically Signed   By: Elon Alas M.D.   On: 05/08/2016 06:16    Lab Data:  CBC:  Recent Labs Lab 05/07/16 0950 05/07/16 1454  WBC 9.5 11.7*  HGB 9.3* 9.2*  HCT 29.9* 30.3*  MCV 93.4 91.3  PLT 312 035   Basic Metabolic Panel:  Recent Labs Lab 05/07/16 0950 05/07/16 1454 05/08/16 0636  NA 138  --  141  K 4.1  --  3.4*  CL 105  --  104  CO2 27  --  29  GLUCOSE 173*  --  80  BUN 35*  --  30*  CREATININE 1.37* 1.46* 1.53*  CALCIUM 8.8*  --  9.0   GFR: Estimated Creatinine Clearance: 41.9 mL/min (A) (by C-G formula based on SCr of 1.53 mg/dL (H)). Liver Function Tests:  Recent Labs Lab 05/07/16 0950  AST 33  ALT 22  ALKPHOS 64  BILITOT 0.5  PROT 6.9  ALBUMIN 3.6    Recent Labs Lab 05/07/16 0950  LIPASE 21   No results for input(s): AMMONIA in the last 168 hours. Coagulation Profile: No results for input(s): INR, PROTIME in the last 168 hours. Cardiac Enzymes:  Recent Labs Lab 05/07/16 0950 05/07/16 1841 05/07/16 2346 05/08/16 0636  TROPONINI 0.04* 0.04* 0.05* 0.06*   BNP (last 3 results) No results for input(s): PROBNP in the last 8760 hours. HbA1C: No results for input(s): HGBA1C in the last 72 hours. CBG:  Recent Labs Lab 05/07/16 1634 05/07/16 2119 05/08/16 0724 05/08/16 1115  GLUCAP 100* 128* 78 137*   Lipid Profile: No results for input(s): CHOL, HDL, LDLCALC, TRIG, CHOLHDL, LDLDIRECT in the last 72 hours. Thyroid Function Tests: No results for input(s): TSH, T4TOTAL, FREET4, T3FREE, THYROIDAB in the last 72  hours. Anemia Panel: No results for input(s): VITAMINB12, FOLATE, FERRITIN, TIBC, IRON, RETICCTPCT in the last 72 hours. Urine analysis:    Component Value Date/Time   COLORURINE YELLOW 02/09/2016 1700   APPEARANCEUR CLEAR 02/09/2016 1700   LABSPEC 1.012 02/09/2016 1700   PHURINE 5.0 02/09/2016 1700   GLUCOSEU NEGATIVE 02/09/2016 1700   HGBUR NEGATIVE 02/09/2016 1700   BILIRUBINUR NEGATIVE 02/09/2016 1700   KETONESUR NEGATIVE 02/09/2016 1700   PROTEINUR NEGATIVE 02/09/2016 1700   UROBILINOGEN 0.2 10/26/2008 1315   NITRITE NEGATIVE 02/09/2016 1700   LEUKOCYTESUR NEGATIVE 02/09/2016 1700     Valerian Jewel M.D. [facility] 05/08/2016, 11:37 AM  Pager: [phone] Between  7am to 7pm - call Pager - [phone]  After 7pm go to [email or web address] - password [email or web address]  Call night coverage person covering after 7pm

## 2016-05-08 NOTE — Consult Note (Signed)
Cardiology Consultation:   Patient ID: Mark Rivers; [id number]; February 28, 1934   Admit date: 05/07/2016 Date of Consult: 05/08/2016  Primary Care Provider: [name]., MD Primary Cardiologist: Dr. Johnsie Cancel Primary Electrophysiologist:  N/A  Patient Profile:   Mark Rivers is a 82 y.o. male with a history of CAD (remote angioplasty 1988, NSTEMI 10/2015 in setting of AF RVR with resultant emergent CABG with L-LAD, S-OM2, S-PDA with complicated admission thereafter), persistent atrial fib, sinus bradycardia (prior HR 54 when in normal rhythm 10/2015), CKD III, HTN, HLD, COPD with chronic respiratory failure on home O2, MRSE bacteremia, chronic diastolic CHF, GIB, anemia, DM, HLD, neuropathy, remote strokes by MRI, recently diagnosed esophageal cancer whom we are asked to see by Dr. Tana Coast for shortness of breath.  History of Present Illness:   To recap history, at time of CABG 10/2015 he had a complex post op course notable for blood loss anemia requiring transfusion with PRBCs, difficulty in weaning from the ventilator resulting in tracheostomy, chest tube placement for R pleural effusion, MRSE bacteremia and AKI which limited diuresis. He was placed on Coumadin for recurrent AF.He was DC to [facility]. He was readmitted 05/9161 with a/c diastolic CHF requiring diuresis. He was placed on amiodarone with plans for DCCV in a few weeks. However, he was admitted again 1/31-02/13/16 with an upper GI bleed c/b AKI, influenza. He required 1 unit PRBC. His diuretics were held due to hypotension and AKI and not resumed at discharge. He was also taken off nitrates for hypotension. 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 Coumadin was held but resumed at DC. He was taken off of ASA. Of note, he had digoxin  added to his medical regimen at some point (prior to admission).  Per Dr. Kyla Balzarine note 03/31/16, the patient was actually off Coumadin again as he had had EGD 03/09/16 by Dr [name] and was found to have esophageal adenocarcinoma. As he was felt to be a poor surgical candidate, he was referred to [facility] for possible endoscopic rx. Dr. Johnsie Cancel recommended f/u with pulmonary as he was still wearing oxygen. GI note from 03/27/16 indicates they were OK with him remaining on aspirin 9m daily so the patient has been taking this. He reports his upcoming endoscopic procedure is scheduled for 05/18/16.   He presented back to the hospital 05/07/16 for dizziness and SOB. For the past 2 weeks he's noticed worsening DOE. He has 3 dogs and several ducks on his property and has been finding it increasingly difficult to maneuver around without having to stop and catch his breath several times. He's also noticed orthopnea. 2 nights ago he noticed when he abruptly turned his head. He had to fall back on the cough. At that point he decided to seek care. No chest pain or syncope. He is generally aware of his atrial fib per his report. No bleeding. CT head with small vessel disease, otherwise nonacute. MRI/A of head showed no acute IC process, old left pontine infarcts, moderate white matter changes, slow flow versus occluded right vertebral artery distal to PICA origin. CXR pulm vascular congestion. Most recent echo yesterday showed moderate LVH, EF 55-60%, no RWMA, mild AS, mild MR, severe LAE, mod RAE, PASP 55 (was 47 in 01/2016). Labs notable for WBC 11.7, Cr 1.37-1.5 (baseline felt to be 1.5-1.8 per nephro note),  K 3.4, troponin 0.04-0.04-0.05-0.06, pBNP 1301, Hgb 9.3 (stable to 02/2016). He's since received Lasix 40m + 281mwithout significant weight change yet (admit weight was a stated weight then re-weighed at 204-205) but is feeling slightly better since a net -2.5L out. Tele atrial fib with borderline elevated rates 80s-120s, pulse  ox requiring [place] O2 (93%).   D/c weight 01/12/16 from CHF admission 191 when patient was felt to be slightly volume contracted, 184 during 02/2016 admission, 199 at office visit 03/31/16, and 204lb at office visit 04/27/16 with nephrology who suspected some body weight gain. Mark Rivers out a lot including a lot of fast food because he lives alone. He does not watch his sodium intake even though he admits he needs to. Denies excess fluid intake.   Past Medical History:  Diagnosis Date  . Allergy   . Back pain   . Chronic diastolic CHF (congestive heart failure) ([place]/12/[place]   A. Echo 10/17: Moderate LVH, EF 55-60, normal wall motion, grade 2 diastolic dysfunction, mild aortic stenosis (mean 14, peak 34), MAC, mild LAE    . Chronic respiratory failure ([facility]  . CKD (chronic kidney disease) stage 3, GFR 30-59 ml/min 03/04/2014  . Coronary artery disease    a. s/p remote POBA in 1988 // b. NSTEMI in 10/17 >> LHC with 99% LM stenosis >> emergent CABG (L-LAD, S-OM2, S-PDA) - post op course complicated (prolonged ventilation, s/p Trach, AFib, anemia req transfusion, bacteremia >> DC to LTAC)  . Diabetes mellitus   . Difficult intubation    difficult airway/FYI note 11/01/2015  . Esophageal adenocarcinoma ([facility]   a. found by EGD 03/2016..  . GERD (gastroesophageal reflux disease)   . GI bleed 01/2016   a. 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 F/u EGD with esophageal adenocarcinoma 03/2016.  . Marland Kitchenistory of nuclear stress test    a. Myoview 6/14 - EF 51, no ischemia or scar; Low Risk  . Hyperlipemia   . Hypertension   . Methicillin resistant Staphylococcus epidermidis infection 11/2015   a. during adm for CABG.  . Neuropathy   . On home oxygen therapy    "2L; 24/7" (02/09/2016)  . Persistent atrial fibrillation ([place]CH[place] 10/25/2015  . Sinus bradycardia    a. Prior HR 54 when in sinus rhythm 11/2015.  . [facility]   a. old L pontine infarcts seen on CT 04/2016.    Past Surgical History:  Procedure Laterality Date  . CARDIAC CATHETERIZATION N/A 10/26/2015   Procedure: Left Heart Cath and Coronary Angiography;  Surgeon: ThTroy SineMD;  [facility];  Service: Cardiovascular;  Laterality: N/A;  . CATARACT EXTRACTION W/ INTRAOCULAR LENS  IMPLANT, BILATERAL  2008  . COLONOSCOPY    . CORONARY ANGIOPLASTY WITH STENT PLACEMENT  1988  . CORONARY ARTERY BYPASS GRAFT N/A 10/26/2015   Procedure: CORONARY ARTERY BYPASS GRAFTING (CABG) x3(LIMA to LA, SVG to OM1, SVG to PDA) with EVH from the left thigh and partial lower leg greater saphenous vein and left internal mammary artery;  Surgeon: PeIvin PootMD;  [facility] [place] Service: Open Heart Surgery;  Laterality: N/A;  . ESOPHAGOGASTRODUODENOSCOPY (EGD) WITH PROPOFOL N/A 02/11/2016   Procedure: ESOPHAGOGASTRODUODENOSCOPY (EGD) WITH PROPOFOL;  Surgeon: MaLadene ArtistMD;  [facility];  Service: Endoscopy;  Laterality: N/A;  .  EUS N/A 03/09/2016   Procedure: UPPER ENDOSCOPIC ULTRASOUND (EUS) RADIAL;  Surgeon: Milus Banister, MD;  Location: [facility]COPY;  Service: Endoscopy;  Laterality: N/A;  . TEE WITHOUT CARDIOVERSION N/A 10/26/2015   Procedure: TRANSESOPHAGEAL ECHOCARDIOGRAM (TEE);  Surgeon: Ivin Poot, MD;  Location: [place];  Service: Open Heart Surgery;  Laterality: N/A;  . TONSILLECTOMY       Inpatient Medications: Scheduled Meds: . aspirin  325 mg Oral Daily  . atorvastatin  80 mg Oral Daily  . enoxaparin (LOVENOX) injection  40 mg Subcutaneous Q24H  . fenofibrate  54 mg Oral Daily  . furosemide  40 mg Intravenous BID  . insulin aspart  0-20 Units Subcutaneous TID WC  . insulin glargine  20 Units Subcutaneous QHS  . loratadine  10 mg Oral Daily  . metoprolol succinate  50 mg Oral Daily  . pantoprazole  40 mg Oral  Q0600  . traZODone  100 mg Oral QHS   Continuous Infusions:  PRN Meds: acetaminophen, albuterol, hydrALAZINE, morphine injection, ondansetron (ZOFRAN) IV  Allergies:    Allergies  Allergen Reactions  . Amoxicillin Other (See Comments)    PATIENT PASSED OUT   . Cephalexin Other (See Comments)    PATIENT PASSED OUT  . Penicillin G Other (See Comments)    PATIENT PASSED OUT. Has patient had a PCN reaction causing immediate rash, facial/tongue/throat swelling, SOB or lightheadedness with hypotension: Yes Has patient had a PCN reaction causing severe rash involving mucus membranes or skin necrosis: No Has patient had a PCN reaction that required hospitalization: Yes Has patient had a PCN reaction occurring within the last 10 years: Yes If all of the above answers are "NO", then may proceed with Cephalosporin    Social History:   Social History   Social History  . Marital status: Widowed    Spouse name: N/A  . Number of children: N/A  . Years of education: N/A   Occupational History  . retired    Social History Main Topics  . Smoking status: Former Smoker    Packs/day: 2.00    Years: 30.00    Types: Cigarettes    Quit date: 06/28/2004  . Smokeless tobacco: Never Used  . Alcohol use No     Comment: QUIT 06/28/2004  . Drug use: No  . Sexual activity: Yes   Other Topics Concern  . Not on file   Social History Narrative  . No narrative on file    Family History:   The patient's family history includes Diabetes in his father. There is no history of Colon cancer, Esophageal cancer, Stomach cancer, Rectal cancer, Allergic rhinitis, Angioedema, Asthma, Eczema, Immunodeficiency, Urticaria, or Liver disease.  ROS:  Please see the history of present illness.  All other ROS reviewed and negative.     Physical Exam/Data:   Vitals:   05/07/16 1500 05/07/16 2051 05/07/16 2356 05/08/16 0428  BP:  (!) 148/86 133/79 125/80  Pulse:  75 78 85  Resp:   18 18  Temp:  97.8 F  (36.6 C) 98.6 F (37 C) 97.7 F (36.5 C)  TempSrc:  Oral Oral Oral  SpO2: 96% 97% 96% 93%  Weight:    205 lb 1.6 oz (93 kg)  Height:        Intake/Output Summary (Last 24 hours) at 05/08/16 0916 Last data filed at 05/08/16 0600  Gross per 24 hour  Intake              540 ml  Output             3100 ml  Net            -2560 ml   Filed Weights   05/07/16 0922 05/07/16 1417 05/08/16 0428  Weight: 200 lb (90.7 kg) 204 lb 12.9 oz (92.9 kg) 205 lb 1.6 oz (93 kg)   Body mass index is 32.12 kg/m.  General:  Well nourished, well developed pale elderly WM in no acute distress HEENT: normal Lymph: no adenopathy Neck: no JVD Endocrine:  No thryomegaly Vascular: No carotid bruits; 2+ pedal pulses in tact Cardiac:  Irregularly irregular, rate borderline controlled, soft murmur RUSB Lungs: diminished at bases, no wheezing, rhonchi or rales  Abd: soft, nontender, no hepatomegaly  Ext: no edema Musculoskeletal:  No deformities, BUE and BLE strength normal and equal Skin: warm and dry  Neuro:  CNs 2-12 intact, no focal abnormalities noted Psych:  Normal affect    EKG:  The EKG was personally reviewed and demonstrates atrial fib 92bpm with nonspecific ST-T changes, prolonged QT per EKG but visually adjusted for the varying RR does not appear quite this long  Relevant CV Studies: 2D Echo 05/07/16 - Left ventricle: The cavity size was normal. Wall thickness was   increased in a pattern of moderate LVH. Systolic function was   normal. The estimated ejection fraction was in the range of 55%   to 60%. Wall motion was normal; there were no regional wall   motion abnormalities. - Aortic valve: There was mild stenosis. There was trivial   regurgitation. Valve area (VTI): 1.56 cm^2. Valve area (Vmax):   1.62 cm^2. Valve area (Vmean): 1.58 cm^2. - Mitral valve: Calcified annulus. There was mild regurgitation. - Left atrium: The atrium was severely dilated. - Right atrium: The atrium was  moderately dilated. - Pulmonary arteries: Systolic pressure was moderately increased.   PA peak pressure: 55 mm Hg (S). Impressions: - Normal LV systolic function; moderate LVH; calcified aortic valve   with mild AS and trace AI; mild MR; biatrial enlargement; mild TR   with moderately elevated pulmonary pressure.  Laboratory Data:  Chemistry Recent Labs Lab 05/07/16 0950 05/07/16 1454 05/08/16 0636  NA 138  --  141  K 4.1  --  3.4*  CL 105  --  104  CO2 27  --  29  GLUCOSE 173*  --  80  BUN 35*  --  30*  CREATININE 1.37* 1.46* 1.53*  CALCIUM 8.8*  --  9.0  GFRNONAA 47* 44* 41*  GFRAA 55* 51* 48*  ANIONGAP 6  --  8     Recent Labs Lab 05/07/16 0950  PROT 6.9  ALBUMIN 3.6  AST 33  ALT 22  ALKPHOS 64  BILITOT 0.5   Hematology Recent Labs Lab 05/07/16 0950 05/07/16 1454  WBC 9.5 11.7*  RBC 3.20* 3.32*  HGB 9.3* 9.2*  HCT 29.9* 30.3*  MCV 93.4 91.3  MCH 29.1 27.7  MCHC 31.1 30.4  RDW 15.6* 15.7*  PLT 312 284   Cardiac Enzymes Recent Labs Lab 05/07/16 0950 05/07/16 1841 05/07/16 2346 05/08/16 0636  TROPONINI 0.04* 0.04* 0.05* 0.06*   No results for input(s): TROPIPOC in the last 168 hours.  BNP Recent Labs Lab 05/07/16 0950  BNP 1,301.2*    Radiology/Studies:  Dg Chest 2 View  Result Date: 05/07/2016 CLINICAL DATA:  Shortness of breath and dizziness EXAM: CHEST  2 VIEW COMPARISON:  March 31, 2016 FINDINGS: There is a small pleural  effusion on the left. There is no edema or consolidation. Heart is mildly enlarged with pulmonary venous hypertension. No adenopathy. Patient is status post coronary artery bypass grafting. IMPRESSION: Pulmonary vascular congestion. No frank edema or consolidation. Small left pleural effusion. Electronically Signed   By: Lowella Grip III M.D.   On: 05/07/2016 10:51   Ct Head Wo Contrast  Result Date: 05/07/2016 CLINICAL DATA:  Dizziness began yesterday.  Shortness of breath. EXAM: CT HEAD WITHOUT CONTRAST  TECHNIQUE: Contiguous axial images were obtained from the base of the skull through the vertex without intravenous contrast. COMPARISON:  CT head 04/10/2003. FINDINGS: Brain: No evidence for acute infarction, hemorrhage, mass lesion, hydrocephalus, or extra-axial fluid. Moderate cerebral and cerebellar atrophy, has progressed since 2005. Hypoattenuation of white matter favored to represent small vessel disease. Vascular: Advanced vascular calcification in the carotid siphons. No proximal hyperdense vessel to suggest emergent vascular occlusion. Skull: Normal. Negative for fracture or focal lesion. Sinuses/Orbits: No acute finding. Other: None. IMPRESSION: Atrophy and small vessel disease have progressed since 2005. There are no acute intracranial findings observed. Electronically Signed   By: Staci Righter M.D.   On: 05/07/2016 10:53   Mr Jodene Nam Head Wo Contrast  Result Date: 05/08/2016 CLINICAL DATA:  Vertigo, dyspnea on exertion. History of hypertension, hyperlipidemia, diabetes, atrial fibrillation. EXAM: MRI HEAD WITHOUT CONTRAST MRA HEAD WITHOUT CONTRAST TECHNIQUE: Multiplanar, multiecho pulse sequences of the brain and surrounding structures were obtained without intravenous contrast. Angiographic images of the head were obtained using MRA technique without contrast. COMPARISON:  CT HEAD May 07, 2016 FINDINGS: MRI HEAD FINDINGS BRAIN: No reduced diffusion to suggest acute ischemia. No susceptibility artifact to suggest hemorrhage. The ventricles and sulci are normal for patient's age. 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 Patchy supratentorial white matter FLAIR T2 hyperintensities. No suspicious parenchymal signal, masses or mass effect. No abnormal extra-axial fluid collections. VASCULAR: Attenuated RIGHT distal V4 segment, see below. SKULL AND UPPER CERVICAL SPINE: No abnormal sellar expansion. No suspicious calvarial bone marrow signal. Craniocervical junction maintained. SINUSES/ORBITS: Minimal paranasal sinus  mucosal thickening. Mastoid air cells are well aerated. The included ocular globes and orbital contents are non-suspicious. Status post bilateral ocular lens implants. OTHER: None. MRA HEAD FINDINGS- mild motion degraded examination. ANTERIOR CIRCULATION: Normal flow related enhancement of the included cervical, petrous, cavernous and supraclinoid internal carotid arteries. Patent anterior communicating artery. Normal flow related enhancement of the anterior and middle cerebral arteries, including distal segments. No large vessel occlusion, high-grade stenosis, abnormal luminal irregularity, aneurysm. POSTERIOR CIRCULATION: Loss of RIGHT vertebral artery flow related enhancement distal to the RIGHT posterior-inferior cerebellar artery territory origin. Basilar artery is patent, with normal flow related enhancement of the main branch vessels. Normal flow related enhancement of the posterior cerebral arteries. Fetal origin RIGHT posterior cerebral artery. Robust LEFT posterior communicating artery present. No large vessel occlusion, high-grade stenosis, abnormal luminal irregularity, aneurysm. ANATOMIC VARIANTS: None. IMPRESSION: MRI HEAD: No acute intracranial process. Moderate white matter changes.  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 MRA HEAD: Slow flow versus occluded RIGHT vertebral artery distal to PICA origin. Complete circle of Willis. Electronically Signed   By: Elon Alas M.D.   On: 05/08/2016 06:16   Mr Brain Wo Contrast  Result Date: 05/08/2016 CLINICAL DATA:  Vertigo, dyspnea on exertion. History of hypertension, hyperlipidemia, diabetes, atrial fibrillation. EXAM: MRI HEAD WITHOUT CONTRAST MRA HEAD WITHOUT CONTRAST TECHNIQUE: Multiplanar, multiecho pulse sequences of the brain and surrounding structures were obtained without intravenous contrast. Angiographic images of the head were obtained using MRA  technique without contrast. COMPARISON:  CT HEAD May 07, 2016 FINDINGS: MRI HEAD FINDINGS BRAIN: No  reduced diffusion to suggest acute ischemia. No susceptibility artifact to suggest hemorrhage. The ventricles and sulci are normal for patient's age. 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 Patchy supratentorial white matter FLAIR T2 hyperintensities. No suspicious parenchymal signal, masses or mass effect. No abnormal extra-axial fluid collections. VASCULAR: Attenuated RIGHT distal V4 segment, see below. SKULL AND UPPER CERVICAL SPINE: No abnormal sellar expansion. No suspicious calvarial bone marrow signal. Craniocervical junction maintained. SINUSES/ORBITS: Minimal paranasal sinus mucosal thickening. Mastoid air cells are well aerated. The included ocular globes and orbital contents are non-suspicious. Status post bilateral ocular lens implants. OTHER: None. MRA HEAD FINDINGS- mild motion degraded examination. ANTERIOR CIRCULATION: Normal flow related enhancement of the included cervical, petrous, cavernous and supraclinoid internal carotid arteries. Patent anterior communicating artery. Normal flow related enhancement of the anterior and middle cerebral arteries, including distal segments. No large vessel occlusion, high-grade stenosis, abnormal luminal irregularity, aneurysm. POSTERIOR CIRCULATION: Loss of RIGHT vertebral artery flow related enhancement distal to the RIGHT posterior-inferior cerebellar artery territory origin. Basilar artery is patent, with normal flow related enhancement of the main branch vessels. Normal flow related enhancement of the posterior cerebral arteries. Fetal origin RIGHT posterior cerebral artery. Robust LEFT posterior communicating artery present. No large vessel occlusion, high-grade stenosis, abnormal luminal irregularity, aneurysm. ANATOMIC VARIANTS: None. IMPRESSION: MRI HEAD: No acute intracranial process. Moderate white matter changes.  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 MRA HEAD: Slow flow versus occluded RIGHT vertebral artery distal to PICA origin. Complete circle of Willis. Electronically  Signed   By: Elon Alas M.D.   On: 05/08/2016 06:16    Assessment and Plan:  64M with CAD (remote angioplasty 1988, NSTEMI 10/2015 in setting of AF RVR with resultant emergent CABG with L-LAD, S-OM2, S-PDA with complicated admission thereafter), persistent atrial fib, sinus bradycardia (prior HR 54 when in normal rhythm 10/2015), CKD III, HTN, HLD, COPD with chronic respiratory failure on home O2, MRSE bacteremia, chronic diastolic CHF, GIB 08/6576, anemia, DM, HLD, neuropathy, remote strokes by MRI, recently diagnosed esophageal cancer whom we are asked to see for shortness of breath.  1. Dyspnea - agree this is likely multifactorial in the setting of CHF, COPD, AF. I agree there is a strong component of acute on chronic diastolic CHF in setting of noncompliance with dietary recommendations. See #2. If dyspnea fails to resolve with diuresis may need to consider evaluation for PE given his underlying malignancy and elevated PA pressures on echo, although this is similar to 01/2016.  2. Acute on chronic diastolic CHF - difficult to know how much of his weight gain is body weight versus fluid (eating lots of fast food) but he was down to 183 during 02/2016 admission. He diuresed well by I/O's with Lasix yesterday. Would continue current regimen at 9m IV BID and add KCl 415m daily. Will obtain nutrition consult for reinforcement in low sodium restriction. He lives alone and this drives his desire for convenience foods.  3.  Persistent atrial fibrillation - CHADSVASC = 8 (note occult strokes seen on CT this admission) Elevated rate could also be contributing to the above. Note that when in sinus in the past his HR was 54 so would not be too aggressive with rate control. His rate seems slightly improved this AM with diuresis. I will review further med titration with Dr. RaOval LinseyHis AF will be managed with rate control strategy only for now as we are not able to  anticoagulate given ongoing management of  his esophageal cancer.  4. Dizziness - possibly vertiginous in nature, resolved this AM. Follow.  5. CAD s/p remote PTCA, recent CABG 10/2015 with complex hospital course - minimal trop elevation is likely demand process. Continue aspirin, statin, beta blocker.   6. CKD stage III - per nephrology note, baseline Cr 1.5-1.8. May need to run Cr slightly higher to achieve euvolemia.  Signed, Charlie Pitter, PA-C  05/08/2016 9:16 AM

## 2016-05-08 NOTE — Plan of Care (Signed)
Problem: Food- and Nutrition-Related Knowledge Deficit (NB-1.1) Goal: Nutrition education Formal process to instruct or train a patient/client in a skill or to impart knowledge to help patients/clients voluntarily manage or modify food choices and eating behavior to maintain or improve health. Outcome: Completed/Met Date Met: 05/08/16 Nutrition Education Note  RD consulted for nutrition education regarding new onset CHF.  RD provided "Heart Failure Nutrition Therapy" handout from the Academy of [employer]. Reviewed patient's dietary recall. Pt currently eats diet very high in sodium, eats a lot of fast food, hot dogs, etc. Pt with very limited prior knowledge of low sodium diet.  Provided examples on ways to decrease sodium intake in diet. Discouraged intake of processed foods and use of salt shaker. Encouraged fresh fruits and vegetables as well as whole grain sources of carbohydrates to maximize fiber intake. Provided examples of easy to prepare, low sodium meal and snack ideas for pt at home to limit po intake of fast food.   RD discussed why it is important for patient to adhere to diet recommendations, and emphasized the role of fluids, foods to avoid, and importance of weighing self daily. Teach back method used.  Expect good compliance. Pt lives home alone, wife recently passed away. Family friends at bedside during education, very supportive and plan to assist pt as needed at discharge  Body mass index is 32.12 kg/m. Pt meets criteria for obesity unspecified based on current BMI.  Current diet order is Heart Healthy/Carb Modified, patient is consuming approximately 100% of meals at this time. Labs and medications reviewed. No further nutrition interventions warranted at this time. RD contact information provided. If additional nutrition issues arise, please re-consult RD.   Kerman Passey [place], [place], LDN [phone] Pager  [phone] Weekend/On-Call Pager

## 2016-05-09 ENCOUNTER — Inpatient Hospital Stay (HOSPITAL_COMMUNITY): Payer: Medicare Other

## 2016-05-09 DIAGNOSIS — E876 Hypokalemia: Secondary | ICD-10-CM

## 2016-05-09 DIAGNOSIS — R0600 Dyspnea, unspecified: Secondary | ICD-10-CM

## 2016-05-09 DIAGNOSIS — E7800 Pure hypercholesterolemia, unspecified: Secondary | ICD-10-CM

## 2016-05-09 LAB — BASIC METABOLIC PANEL
ANION GAP: 8 (ref 5–15)
BUN: 31 mg/dL — ABNORMAL HIGH (ref 6–20)
CALCIUM: 9.1 mg/dL (ref 8.9–10.3)
CO2: 31 mmol/L (ref 22–32)
Chloride: 100 mmol/L — ABNORMAL LOW (ref 101–111)
Creatinine, Ser: 1.6 mg/dL — ABNORMAL HIGH (ref 0.61–1.24)
GFR, EST AFRICAN AMERICAN: 45 mL/min — AB (ref >60)
GFR, EST NON AFRICAN AMERICAN: 39 mL/min — AB (ref >60)
Glucose, Bld: 82 mg/dL (ref 65–99)
POTASSIUM: 3.5 mmol/L (ref 3.5–5.1)
Sodium: 139 mmol/L (ref 135–145)

## 2016-05-09 LAB — CBC
HEMATOCRIT: 30.1 % — AB (ref 39.0–52.0)
HEMOGLOBIN: 9 g/dL — AB (ref 13.0–17.0)
MCH: 27.2 pg (ref 26.0–34.0)
MCHC: 29.9 g/dL — ABNORMAL LOW (ref 30.0–36.0)
MCV: 90.9 fL (ref 78.0–100.0)
Platelets: 296 10*3/uL (ref 150–400)
RBC: 3.31 MIL/uL — AB (ref 4.22–5.81)
RDW: 15.6 % — ABNORMAL HIGH (ref 11.5–15.5)
WBC: 12.2 10*3/uL — ABNORMAL HIGH (ref 4.0–10.5)

## 2016-05-09 LAB — GLUCOSE, CAPILLARY
GLUCOSE-CAPILLARY: 81 mg/dL (ref 65–99)
GLUCOSE-CAPILLARY: 113 mg/dL — AB (ref 65–99)
GLUCOSE-CAPILLARY: 117 mg/dL — AB (ref 65–99)
Glucose-Capillary: 139 mg/dL — ABNORMAL HIGH (ref 65–99)

## 2016-05-09 MED ORDER — FUROSEMIDE 40 MG PO TABS
40.0000 mg | ORAL_TABLET | Freq: Two times a day (BID) | ORAL | Status: DC
  Administered 2016-05-09 – 2016-05-10 (×2): 40 mg via ORAL
  Filled 2016-05-09 (×2): qty 1

## 2016-05-09 MED ORDER — TECHNETIUM TO 99M ALBUMIN AGGREGATED
4.2000 | Freq: Once | INTRAVENOUS | Status: AC | PRN
  Administered 2016-05-09: 4.2 via INTRAVENOUS

## 2016-05-09 MED ORDER — TECHNETIUM TC 99M DIETHYLENETRIAME-PENTAACETIC ACID: 32.7000 | Freq: Once | INTRAVENOUS | Status: DC | PRN

## 2016-05-09 NOTE — Progress Notes (Signed)
Pt slept well during the night, Vitals stable, no any sign of SOB and distress noted, tylenol given for complain of shoulder pain, will continue to monitor the patient.

## 2016-05-09 NOTE — Progress Notes (Signed)
`  Progress Note  Patient Name: Mark Rivers Date of Encounter: 05/09/2016  Primary Cardiologist: Dr. Johnsie Cancel  Subjective   Patient diuresing well. SOB has improved greatly since admission.  Inpatient Medications    Scheduled Meds: . aspirin  325 mg Oral Daily  . atorvastatin  80 mg Oral Daily  . enoxaparin (LOVENOX) injection  40 mg Subcutaneous Q24H  . fenofibrate  54 mg Oral Daily  . furosemide  40 mg Intravenous Q12H  . insulin aspart  0-20 Units Subcutaneous TID WC  . insulin glargine  20 Units Subcutaneous QHS  . loratadine  10 mg Oral Daily  . metoprolol succinate  50 mg Oral Daily  . pantoprazole  40 mg Oral Q0600  . potassium chloride  40 mEq Oral Daily  . traZODone  100 mg Oral QHS   Continuous Infusions:  PRN Meds: acetaminophen, albuterol, hydrALAZINE, meclizine, morphine injection, ondansetron (ZOFRAN) IV   Vital Signs    Vitals:   05/08/16 1227 05/08/16 1723 05/08/16 2213 05/09/16 0600  BP: (!) 141/81 138/90 129/80 134/75  Pulse: 86 95 (!) 54 82  Resp: 18  18 18   Temp: 97.9 F (36.6 C)  97.8 F (36.6 C) 98.1 F (36.7 C)  TempSrc: Oral  Oral Oral  SpO2: 97%  96% 98%  Weight:    193 lb 3.2 oz (87.6 kg)  Height:        Intake/Output Summary (Last 24 hours) at 05/09/16 0754 Last data filed at 05/09/16 0647  Gross per 24 hour  Intake              600 ml  Output             4154 ml  Net            -3554 ml   Filed Weights   05/07/16 1417 05/08/16 0428 05/09/16 0600  Weight: 204 lb 12.9 oz (92.9 kg) 205 lb 1.6 oz (93 kg) 193 lb 3.2 oz (87.6 kg)    Telemetry     ECG    The EKG was personally reviewed and demonstrates atrial fib 92bpm with nonspecific ST-T changes, prolonged QT per EKG but visually adjusted for the varying RR does not appear quite this long  Physical Exam   GEN: Well nourished, well developed HEENT: normal  Neck: no JVD, carotid bruits, or masses Cardiac: RRR. no murmurs, rubs, or gallops,no edema. Intact distal pulses  bilaterally.  Respiratory: clear to auscultation bilaterally, normal work of breathing GI: soft, nontender, nondistended, + BS MS: no deformity or atrophy  Skin: warm and dry, no rash Neuro: Alert and Oriented x 3, Strength and sensation are intact Psych:   Full affect  Labs    Chemistry Recent Labs Lab 05/07/16 0950 05/07/16 1454 05/08/16 0636 05/09/16 0414  NA 138  --  141 139  K 4.1  --  3.4* 3.5  CL 105  --  104 100*  CO2 27  --  29 31  GLUCOSE 173*  --  80 82  BUN 35*  --  30* 31*  CREATININE 1.37* 1.46* 1.53* 1.60*  CALCIUM 8.8*  --  9.0 9.1  PROT 6.9  --   --   --   ALBUMIN 3.6  --   --   --   AST 33  --   --   --   ALT 22  --   --   --   ALKPHOS 64  --   --   --  BILITOT 0.5  --   --   --   GFRNONAA 47* 44* 41* 39*  GFRAA 55* 51* 48* 45*  ANIONGAP 6  --  8 8     Hematology Recent Labs Lab 05/07/16 0950 05/07/16 1454 05/09/16 0414  WBC 9.5 11.7* 12.2*  RBC 3.20* 3.32* 3.31*  HGB 9.3* 9.2* 9.0*  HCT 29.9* 30.3* 30.1*  MCV 93.4 91.3 90.9  MCH 29.1 27.7 27.2  MCHC 31.1 30.4 29.9*  RDW 15.6* 15.7* 15.6*  PLT 312 284 296    Cardiac Enzymes Recent Labs Lab 05/07/16 0950 05/07/16 1841 05/07/16 2346 05/08/16 0636  TROPONINI 0.04* 0.04* 0.05* 0.06*   BNP Recent Labs Lab 05/07/16 0950  BNP 1,301.2*     DDimer  Recent Labs Lab 05/08/16 1139  DDIMER 1.62*     Radiology    Dg Chest 2 View  Result Date: 05/07/2016 CLINICAL DATA:  Shortness of breath and dizziness EXAM: CHEST  2 VIEW COMPARISON:  March 31, 2016 FINDINGS: There is a small pleural effusion on the left. There is no edema or consolidation. Heart is mildly enlarged with pulmonary venous hypertension. No adenopathy. Patient is status post coronary artery bypass grafting. IMPRESSION: Pulmonary vascular congestion. No frank edema or consolidation. Small left pleural effusion. Electronically Signed   By: Lowella Grip III M.D.   On: 05/07/2016 10:51   Ct Head Wo  Contrast  Result Date: 05/07/2016 CLINICAL DATA:  Dizziness began yesterday.  Shortness of breath. EXAM: CT HEAD WITHOUT CONTRAST TECHNIQUE: Contiguous axial images were obtained from the base of the skull through the vertex without intravenous contrast. COMPARISON:  CT head 04/10/2003. FINDINGS: Brain: No evidence for acute infarction, hemorrhage, mass lesion, hydrocephalus, or extra-axial fluid. Moderate cerebral and cerebellar atrophy, has progressed since 2005. Hypoattenuation of white matter favored to represent small vessel disease. Vascular: Advanced vascular calcification in the carotid siphons. No proximal hyperdense vessel to suggest emergent vascular occlusion. Skull: Normal. Negative for fracture or focal lesion. Sinuses/Orbits: No acute finding. Other: None. IMPRESSION: Atrophy and small vessel disease have progressed since 2005. There are no acute intracranial findings observed. Electronically Signed   By: Staci Righter M.D.   On: 05/07/2016 10:53   Mr Jodene Nam Head Wo Contrast  Result Date: 05/08/2016 CLINICAL DATA:  Vertigo, dyspnea on exertion. History of hypertension, hyperlipidemia, diabetes, atrial fibrillation. EXAM: MRI HEAD WITHOUT CONTRAST MRA HEAD WITHOUT CONTRAST TECHNIQUE: Multiplanar, multiecho pulse sequences of the brain and surrounding structures were obtained without intravenous contrast. Angiographic images of the head were obtained using MRA technique without contrast. COMPARISON:  CT HEAD May 07, 2016 FINDINGS: MRI HEAD FINDINGS BRAIN: No reduced diffusion to suggest acute ischemia. No susceptibility artifact to suggest hemorrhage. The ventricles and sulci are normal for patient's age. 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 Patchy supratentorial white matter FLAIR T2 hyperintensities. No suspicious parenchymal signal, masses or mass effect. No abnormal extra-axial fluid collections. VASCULAR: Attenuated RIGHT distal V4 segment, see below. SKULL AND UPPER CERVICAL SPINE: No abnormal sellar  expansion. No suspicious calvarial bone marrow signal. Craniocervical junction maintained. SINUSES/ORBITS: Minimal paranasal sinus mucosal thickening. Mastoid air cells are well aerated. The included ocular globes and orbital contents are non-suspicious. Status post bilateral ocular lens implants. OTHER: None. MRA HEAD FINDINGS- mild motion degraded examination. ANTERIOR CIRCULATION: Normal flow related enhancement of the included cervical, petrous, cavernous and supraclinoid internal carotid arteries. Patent anterior communicating artery. Normal flow related enhancement of the anterior and middle cerebral arteries, including distal segments. No large vessel occlusion, high-grade  stenosis, abnormal luminal irregularity, aneurysm. POSTERIOR CIRCULATION: Loss of RIGHT vertebral artery flow related enhancement distal to the RIGHT posterior-inferior cerebellar artery territory origin. Basilar artery is patent, with normal flow related enhancement of the main branch vessels. Normal flow related enhancement of the posterior cerebral arteries. Fetal origin RIGHT posterior cerebral artery. Robust LEFT posterior communicating artery present. No large vessel occlusion, high-grade stenosis, abnormal luminal irregularity, aneurysm. ANATOMIC VARIANTS: None. IMPRESSION: MRI HEAD: No acute intracranial process. Moderate white matter changes.  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 MRA HEAD: Slow flow versus occluded RIGHT vertebral artery distal to PICA origin. Complete circle of Willis. Electronically Signed   By: Elon Alas M.D.   On: 05/08/2016 06:16   Mr Brain Wo Contrast  Result Date: 05/08/2016 CLINICAL DATA:  Vertigo, dyspnea on exertion. History of hypertension, hyperlipidemia, diabetes, atrial fibrillation. EXAM: MRI HEAD WITHOUT CONTRAST MRA HEAD WITHOUT CONTRAST TECHNIQUE: Multiplanar, multiecho pulse sequences of the brain and surrounding structures were obtained without intravenous contrast. Angiographic images of the  head were obtained using MRA technique without contrast. COMPARISON:  CT HEAD May 07, 2016 FINDINGS: MRI HEAD FINDINGS BRAIN: No reduced diffusion to suggest acute ischemia. No susceptibility artifact to suggest hemorrhage. The ventricles and sulci are normal for patient's age. 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 Patchy supratentorial white matter FLAIR T2 hyperintensities. No suspicious parenchymal signal, masses or mass effect. No abnormal extra-axial fluid collections. VASCULAR: Attenuated RIGHT distal V4 segment, see below. SKULL AND UPPER CERVICAL SPINE: No abnormal sellar expansion. No suspicious calvarial bone marrow signal. Craniocervical junction maintained. SINUSES/ORBITS: Minimal paranasal sinus mucosal thickening. Mastoid air cells are well aerated. The included ocular globes and orbital contents are non-suspicious. Status post bilateral ocular lens implants. OTHER: None. MRA HEAD FINDINGS- mild motion degraded examination. ANTERIOR CIRCULATION: Normal flow related enhancement of the included cervical, petrous, cavernous and supraclinoid internal carotid arteries. Patent anterior communicating artery. Normal flow related enhancement of the anterior and middle cerebral arteries, including distal segments. No large vessel occlusion, high-grade stenosis, abnormal luminal irregularity, aneurysm. POSTERIOR CIRCULATION: Loss of RIGHT vertebral artery flow related enhancement distal to the RIGHT posterior-inferior cerebellar artery territory origin. Basilar artery is patent, with normal flow related enhancement of the main branch vessels. Normal flow related enhancement of the posterior cerebral arteries. Fetal origin RIGHT posterior cerebral artery. Robust LEFT posterior communicating artery present. No large vessel occlusion, high-grade stenosis, abnormal luminal irregularity, aneurysm. ANATOMIC VARIANTS: None. IMPRESSION: MRI HEAD: No acute intracranial process. Moderate white matter changes.  Old LEFT pontine  infarcts. MRA HEAD: Slow flow versus occluded RIGHT vertebral artery distal to PICA origin. Complete circle of Willis. Electronically Signed   By: Elon Alas M.D.   On: 05/08/2016 06:16    Cardiac Studies   2D Echo 05/07/16 - Left ventricle: The cavity size was normal. Wall thickness was increased in a pattern of moderate LVH. Systolic function was normal. The estimated ejection fraction was in the range of 55% to 60%. Wall motion was normal; there were no regional wall motion abnormalities. - Aortic valve: There was mild stenosis. There was trivial regurgitation. Valve area (VTI): 1.56 cm^2. Valve area (Vmax): 1.62 cm^2. Valve area (Vmean): 1.58 cm^2. - Mitral valve: Calcified annulus. There was mild regurgitation. - Left atrium: The atrium was severely dilated. - Right atrium: The atrium was moderately dilated. - Pulmonary arteries: Systolic pressure was moderately increased. PA peak pressure: 55 mm Hg (S). Impressions: - Normal LV systolic function; moderate LVH; calcified aortic valve with mild AS and trace AI; mild  MR; biatrial enlargement; mild TR with moderately elevated pulmonary pressure.   Patient Profile     Mark Rivers is a 82 y.o. male with a history of CAD (remote angioplasty 1988, NSTEMI 10/2015 in setting of AF RVR with resultant emergent CABG with L-LAD, S-OM2, S-PDA with complicated admission thereafter), persistent atrial fib, sinus bradycardia (prior HR 54 when in normal rhythm 10/2015), CKD III, HTN, HLD, COPD with chronic respiratory failure on home O2, MRSE bacteremia, chronic diastolic CHF, GIB, anemia, DM, HLD, neuropathy, remote strokes by MRI, recently diagnosed esophageal cancer whom we are asked to see by Dr. Tana Coast for shortness of breath.  Assessment & Plan    Today's Weight: 193 lb     Admission Weight:  200 lb     Blood Pressure: 134/75    Hr: 80's Labs:  Creatinine 1.60,  K 3.5 , Na 139 , WBC 11.7, Hbg 9.0  1. Dyspnea  - Multifactoral in setting of CHF, COPD, AF. I agree there is a strong component of acute on chronic diastolic CHF in setting of noncompliance with dietary recommendations. Dyspnea has been resolving with diuresis making PE less of a concern.   2. Acute on chronic diastolic CHF : He diuresed well by I/O's with IV Lasix yesterday. Lasix switched to 40 mg BID PO  but switch to PO today per Dr. Oval Linsey -- Continue potassium  -- Nutrition consult ordered yesterday and patient says that it was very beneficial and that he learned  a lot  3.  Persistent atrial fibrillation - CHADSVASC = 8 (note occult strokes seen on CT this admission) Elevated rate could also be contributing to the above. Currently heart rate is in the 80's, per chart his AF control will be through rate control, as we are not able to anticoagulate given ongoing management of his esophageal cancer.  4. Dizziness - possibly vertiginous in nature, resolved.  Medicine to manage.  5. CAD s/p remote PTCA, recent CABG 10/2015 with complex hospital course - minimal trop elevation is likely demand process. Continue aspirin, statin, beta blocker.  (0.04--> 0./05 --> 0.06)  6. CKD stage III - per nephrology note, baseline Cr 1.5-1.8. May need to run Cr slightly higher to achieve euvolemia.  If patient continues to do while will likely be able to discharge tomorrow.   Kristopher Glee, PA-C  05/09/2016, 7:54 AM

## 2016-05-09 NOTE — Progress Notes (Signed)
PT Cancellation Note  Patient Details Name: Mark Rivers MRN: [id number] DOB: 01-14-1934   Cancelled Treatment:    Reason Eval/Treat Not Completed: Patient at procedure or test/unavailable.  Will try later as time and pt allow.   Ramond Dial 05/09/2016, 12:02 PM   Mee Hives, PT MS Acute Rehab Dept. Number: [place] and [place]

## 2016-05-09 NOTE — Progress Notes (Signed)
[facility]                                                                              Patient Demographics  Mark Rivers, is a 82 y.o. male, DOB - 06/28/1934, FAO:[id number]  Admit date - 05/07/2016   Admitting Physician Elwin Mocha, MD  Outpatient Primary MD for the patient is [name]., MD  Outpatient specialists:   LOS - 2  days    Chief Complaint  Patient presents with  . Shortness of Breath       Brief summary   Patient is a 82 year old male with hypertension, hyperlipidemia, neuropathy, chronic respiratory failure on 2 L O2 via nasal cannula, CKD, CAD, diabetes presented with the dyspnea on exertion and orthopnea red per patient, he had dyspnea on exertion for last 3 weeks, orthopnea for last 2 weeks, also reported symptoms of vertigo and trouble with balance. Chest x-ray showed pulmonary vascular congestion. CT head was negative. Patient was admitted for acute CHF exacerbation. MRI ruled out any acute infarct.  Troponin slightly positive, cardiology consulted. D-dimer 1.62, follow VQ scan   Assessment & Plan    Principal Problem:   Acute on chronic respiratory failure (HCC) Possibly multifactorial due to acute on chronic diastolic CHF exacerbation, COPD, atrial fibrillation. - BNP elevated to 1301, troponin trending up 0.06, creatinine trending up to 1.53, chest x-ray showed pulmonary vascular congestion - Cardiology consulted, Patient was placed on IV Lasix which has been transitioned to oral Lasix today as creatinine is trending up, 1.6 today  - Strict I's and O's and daily weights, negative balance of 6.5 L  - 2-D echo on 4/29 showed EF of 55-60%, left atrium severely dilated, moderately dilated right atrium, pulmonary hypertension with PA pressure of 55 mmHg - D-dimer 1.6, follow VQ scan - Per cardiology, minimal troponin elevation is likely demand ischemia  Active Problems:   CKD (chronic kidney disease) stage 3, GFR 30-59  ml/min - Baseline creatinine 1.4-1.8, currently 1. 6 likely due to IV diuresis - Lasix transitioned to oral today, follow creatinine function    Diabetes mellitus ([place]) - CBGs controlled, continue Lantus, sliding scale insulin    Hyperlipemia - Continue Lipitor, fenofibrate  Dizziness, likely vertigo  - MRI of the brain/MRA negative for acute infarct - Placed on meclizine as needed, PT evaluation pending - Resolved    Persistent atrial fibrillation ([place]) with RVR - CHADSVASC 8, currently on Toprol-XL 50 mg daily - Not on anticoagulation, follow cardiology recommendations regarding rate control    Coronary artery disease involving native coronary artery of native heart without angina pectoris - Slightly elevated troponins, follow cardiology recommendations - Continue aspirin, Lipitor, beta blocker    Essential hypertension - Continue Toprol-XL, Lasix    Hypokalemia - likely due to diuresis, replaced  Code Status:Full CODE STATUS  DVT Prophylaxis:  Lovenox  Family Communication: Discussed in detail with the patient, all imaging results, lab results explained to the patient   Disposition Plan: Possible DC home in a.m. if cleared by cardiology  Time Spent in minutes   25 minutes  Procedures:  2-D echo: Impressions:  - Normal LV  systolic function; moderate LVH; calcified aortic valve   with mild AS and trace AI; mild MR; biatrial enlargement; mild TR   with moderately elevated pulmonary pressure.   Consultants:   Cardiology   Antimicrobials:   None   Medications  Scheduled Meds: . aspirin  325 mg Oral Daily  . atorvastatin  80 mg Oral Daily  . enoxaparin (LOVENOX) injection  40 mg Subcutaneous Q24H  . fenofibrate  54 mg Oral Daily  . furosemide  40 mg Oral BID  . insulin aspart  0-20 Units Subcutaneous TID WC  . insulin glargine  20 Units Subcutaneous QHS  . loratadine  10 mg Oral Daily  . metoprolol succinate  50 mg Oral Daily  . pantoprazole  40 mg Oral  Q0600  . potassium chloride  40 mEq Oral Daily  . traZODone  100 mg Oral QHS   Continuous Infusions: PRN Meds:.acetaminophen, albuterol, hydrALAZINE, meclizine, morphine injection, ondansetron (ZOFRAN) IV   Antibiotics   Anti-infectives    None        Subjective:   Mark Rivers was seen and examined today. He denies any specific complaints this morning, shortness of breath is improving. Heart rate better controlled today. Patient denies dizziness, abdominal pain, N/V/D/C, new weakness, numbess, tingling. No acute events overnight.  Currently no chest pain.  Objective:   Vitals:   05/08/16 1227 05/08/16 1723 05/08/16 2213 05/09/16 0600  BP: (!) 141/81 138/90 129/80 134/75  Pulse: 86 95 (!) 54 82  Resp: 18  18 18   Temp: 97.9 F (36.6 C)  97.8 F (36.6 C) 98.1 F (36.7 C)  TempSrc: Oral  Oral Oral  SpO2: 97%  96% 98%  Weight:    87.6 kg (193 lb 3.2 oz)  Height:        Intake/Output Summary (Last 24 hours) at 05/09/16 0925 Last data filed at 05/09/16 0758  Gross per 24 hour  Intake              480 ml  Output             4554 ml  Net            -4074 ml     Wt Readings from Last 3 Encounters:  05/09/16 87.6 kg (193 lb 3.2 oz)  03/31/16 90.6 kg (199 lb 12.8 oz)  03/09/16 88 kg (194 lb)     Exam  General: Alert and oriented x 3, NAD  HEENT:    Neck: Supple, no JVD  Cardiovascular: S1 and S2 clear, RRR  Respiratory: Decreased breath sounds at the bases otherwise fairly clear  Gastrointestinal: Soft, nontender, nondistended, + bowel sounds  Ext: no cyanosis clubbing, trace edema  Neuro: no new deficits  Skin: No rashes  Psych: Normal affect and demeanor, alert and oriented x3    Data Reviewed:  I have personally reviewed following labs and imaging studies  Micro Results No results found for this or any previous visit (from the past 240 hour(s)).  Radiology Reports Dg Chest 2 View  Result Date: 05/07/2016 CLINICAL DATA:  Shortness of  breath and dizziness EXAM: CHEST  2 VIEW COMPARISON:  March 31, 2016 FINDINGS: There is a small pleural effusion on the left. There is no edema or consolidation. Heart is mildly enlarged with pulmonary venous hypertension. No adenopathy. Patient is status post coronary artery bypass grafting. IMPRESSION: Pulmonary vascular congestion. No frank edema or consolidation. Small left pleural effusion. Electronically Signed   By: Lowella Grip III M.D.  On: 05/07/2016 10:51   Ct Head Wo Contrast  Result Date: 05/07/2016 CLINICAL DATA:  Dizziness began yesterday.  Shortness of breath. EXAM: CT HEAD WITHOUT CONTRAST TECHNIQUE: Contiguous axial images were obtained from the base of the skull through the vertex without intravenous contrast. COMPARISON:  CT head 04/10/2003. FINDINGS: Brain: No evidence for acute infarction, hemorrhage, mass lesion, hydrocephalus, or extra-axial fluid. Moderate cerebral and cerebellar atrophy, has progressed since 2005. Hypoattenuation of white matter favored to represent small vessel disease. Vascular: Advanced vascular calcification in the carotid siphons. No proximal hyperdense vessel to suggest emergent vascular occlusion. Skull: Normal. Negative for fracture or focal lesion. Sinuses/Orbits: No acute finding. Other: None. IMPRESSION: Atrophy and small vessel disease have progressed since 2005. There are no acute intracranial findings observed. Electronically Signed   By: Staci Righter M.D.   On: 05/07/2016 10:53   Mr Jodene Nam Head Wo Contrast  Result Date: 05/08/2016 CLINICAL DATA:  Vertigo, dyspnea on exertion. History of hypertension, hyperlipidemia, diabetes, atrial fibrillation. EXAM: MRI HEAD WITHOUT CONTRAST MRA HEAD WITHOUT CONTRAST TECHNIQUE: Multiplanar, multiecho pulse sequences of the brain and surrounding structures were obtained without intravenous contrast. Angiographic images of the head were obtained using MRA technique without contrast. COMPARISON:  CT HEAD May 07, 2016 FINDINGS: MRI HEAD FINDINGS BRAIN: No reduced diffusion to suggest acute ischemia. No susceptibility artifact to suggest hemorrhage. The ventricles and sulci are normal for patient's age. 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 Patchy supratentorial white matter FLAIR T2 hyperintensities. No suspicious parenchymal signal, masses or mass effect. No abnormal extra-axial fluid collections. VASCULAR: Attenuated RIGHT distal V4 segment, see below. SKULL AND UPPER CERVICAL SPINE: No abnormal sellar expansion. No suspicious calvarial bone marrow signal. Craniocervical junction maintained. SINUSES/ORBITS: Minimal paranasal sinus mucosal thickening. Mastoid air cells are well aerated. The included ocular globes and orbital contents are non-suspicious. Status post bilateral ocular lens implants. OTHER: None. MRA HEAD FINDINGS- mild motion degraded examination. ANTERIOR CIRCULATION: Normal flow related enhancement of the included cervical, petrous, cavernous and supraclinoid internal carotid arteries. Patent anterior communicating artery. Normal flow related enhancement of the anterior and middle cerebral arteries, including distal segments. No large vessel occlusion, high-grade stenosis, abnormal luminal irregularity, aneurysm. POSTERIOR CIRCULATION: Loss of RIGHT vertebral artery flow related enhancement distal to the RIGHT posterior-inferior cerebellar artery territory origin. Basilar artery is patent, with normal flow related enhancement of the main branch vessels. Normal flow related enhancement of the posterior cerebral arteries. Fetal origin RIGHT posterior cerebral artery. Robust LEFT posterior communicating artery present. No large vessel occlusion, high-grade stenosis, abnormal luminal irregularity, aneurysm. ANATOMIC VARIANTS: None. IMPRESSION: MRI HEAD: No acute intracranial process. Moderate white matter changes.  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 MRA HEAD: Slow flow versus occluded RIGHT vertebral artery distal to PICA origin.  Complete circle of Willis. Electronically Signed   By: Elon Alas M.D.   On: 05/08/2016 06:16   Mr Brain Wo Contrast  Result Date: 05/08/2016 CLINICAL DATA:  Vertigo, dyspnea on exertion. History of hypertension, hyperlipidemia, diabetes, atrial fibrillation. EXAM: MRI HEAD WITHOUT CONTRAST MRA HEAD WITHOUT CONTRAST TECHNIQUE: Multiplanar, multiecho pulse sequences of the brain and surrounding structures were obtained without intravenous contrast. Angiographic images of the head were obtained using MRA technique without contrast. COMPARISON:  CT HEAD May 07, 2016 FINDINGS: MRI HEAD FINDINGS BRAIN: No reduced diffusion to suggest acute ischemia. No susceptibility artifact to suggest hemorrhage. The ventricles and sulci are normal for patient's age. 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 Patchy supratentorial white matter FLAIR T2 hyperintensities. No suspicious parenchymal signal, masses  or mass effect. No abnormal extra-axial fluid collections. VASCULAR: Attenuated RIGHT distal V4 segment, see below. SKULL AND UPPER CERVICAL SPINE: No abnormal sellar expansion. No suspicious calvarial bone marrow signal. Craniocervical junction maintained. SINUSES/ORBITS: Minimal paranasal sinus mucosal thickening. Mastoid air cells are well aerated. The included ocular globes and orbital contents are non-suspicious. Status post bilateral ocular lens implants. OTHER: None. MRA HEAD FINDINGS- mild motion degraded examination. ANTERIOR CIRCULATION: Normal flow related enhancement of the included cervical, petrous, cavernous and supraclinoid internal carotid arteries. Patent anterior communicating artery. Normal flow related enhancement of the anterior and middle cerebral arteries, including distal segments. No large vessel occlusion, high-grade stenosis, abnormal luminal irregularity, aneurysm. POSTERIOR CIRCULATION: Loss of RIGHT vertebral artery flow related enhancement distal to the RIGHT posterior-inferior cerebellar artery  territory origin. Basilar artery is patent, with normal flow related enhancement of the main branch vessels. Normal flow related enhancement of the posterior cerebral arteries. Fetal origin RIGHT posterior cerebral artery. Robust LEFT posterior communicating artery present. No large vessel occlusion, high-grade stenosis, abnormal luminal irregularity, aneurysm. ANATOMIC VARIANTS: None. IMPRESSION: MRI HEAD: No acute intracranial process. Moderate white matter changes.  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 MRA HEAD: Slow flow versus occluded RIGHT vertebral artery distal to PICA origin. Complete circle of Willis. Electronically Signed   By: Elon Alas M.D.   On: 05/08/2016 06:16    Lab Data:  CBC:  Recent Labs Lab 05/07/16 0950 05/07/16 1454 05/09/16 0414  WBC 9.5 11.7* 12.2*  HGB 9.3* 9.2* 9.0*  HCT 29.9* 30.3* 30.1*  MCV 93.4 91.3 90.9  PLT 312 284 527   Basic Metabolic Panel:  Recent Labs Lab 05/07/16 0950 05/07/16 1454 05/08/16 0636 05/09/16 0414  NA 138  --  141 139  K 4.1  --  3.4* 3.5  CL 105  --  104 100*  CO2 27  --  29 31  GLUCOSE 173*  --  80 82  BUN 35*  --  30* 31*  CREATININE 1.37* 1.46* 1.53* 1.60*  CALCIUM 8.8*  --  9.0 9.1   GFR: Estimated Creatinine Clearance: 38.9 mL/min (A) (by C-G formula based on SCr of 1.6 mg/dL (H)). Liver Function Tests:  Recent Labs Lab 05/07/16 0950  AST 33  ALT 22  ALKPHOS 64  BILITOT 0.5  PROT 6.9  ALBUMIN 3.6    Recent Labs Lab 05/07/16 0950  LIPASE 21   No results for input(s): AMMONIA in the last 168 hours. Coagulation Profile: No results for input(s): INR, PROTIME in the last 168 hours. Cardiac Enzymes:  Recent Labs Lab 05/07/16 0950 05/07/16 1841 05/07/16 2346 05/08/16 0636  TROPONINI 0.04* 0.04* 0.05* 0.06*   BNP (last 3 results) No results for input(s): PROBNP in the last 8760 hours. HbA1C: No results for input(s): HGBA1C in the last 72 hours. CBG:  Recent Labs Lab 05/08/16 0724 05/08/16 1115  05/08/16 1626 05/08/16 2119 05/09/16 0739  GLUCAP 78 137* 113* 149* 81   Lipid Profile: No results for input(s): CHOL, HDL, LDLCALC, TRIG, CHOLHDL, LDLDIRECT in the last 72 hours. Thyroid Function Tests: No results for input(s): TSH, T4TOTAL, FREET4, T3FREE, THYROIDAB in the last 72 hours. Anemia Panel: No results for input(s): VITAMINB12, FOLATE, FERRITIN, TIBC, IRON, RETICCTPCT in the last 72 hours. Urine analysis:    Component Value Date/Time   COLORURINE YELLOW 02/09/2016 1700   APPEARANCEUR CLEAR 02/09/2016 1700   LABSPEC 1.012 02/09/2016 1700   PHURINE 5.0 02/09/2016 1700   GLUCOSEU NEGATIVE 02/09/2016 1700   HGBUR NEGATIVE 02/09/2016 1700  BILIRUBINUR NEGATIVE 02/09/2016 1700   KETONESUR NEGATIVE 02/09/2016 1700   PROTEINUR NEGATIVE 02/09/2016 1700   UROBILINOGEN 0.2 10/26/2008 1315   NITRITE NEGATIVE 02/09/2016 [place] 02/09/2016 1700     Ripudeep Rai M.D. [facility] 05/09/2016, 9:25 AM  Pager: [phone] Between 7am to 7pm - call Pager - [phone]  After 7pm go to [email or web address] - password [email or web address]  Call night coverage person covering after 7pm

## 2016-05-10 DIAGNOSIS — I4891 Unspecified atrial fibrillation: Secondary | ICD-10-CM

## 2016-05-10 DIAGNOSIS — I5031 Acute diastolic (congestive) heart failure: Secondary | ICD-10-CM

## 2016-05-10 LAB — BASIC METABOLIC PANEL
Anion gap: 7 (ref 5–15)
BUN: 33 mg/dL — AB (ref 6–20)
CHLORIDE: 98 mmol/L — AB (ref 101–111)
CO2: 33 mmol/L — ABNORMAL HIGH (ref 22–32)
CREATININE: 1.69 mg/dL — AB (ref 0.61–1.24)
Calcium: 9.3 mg/dL (ref 8.9–10.3)
GFR calc Af Amer: 42 mL/min — ABNORMAL LOW (ref >60)
GFR calc non Af Amer: 37 mL/min — ABNORMAL LOW (ref >60)
GLUCOSE: 99 mg/dL (ref 65–99)
POTASSIUM: 3.8 mmol/L (ref 3.5–5.1)
SODIUM: 138 mmol/L (ref 135–145)

## 2016-05-10 LAB — GLUCOSE, CAPILLARY: GLUCOSE-CAPILLARY: 100 mg/dL — AB (ref 65–99)

## 2016-05-10 MED ORDER — POTASSIUM CHLORIDE CRYS ER 20 MEQ PO TBCR: 40.0000 meq | EXTENDED_RELEASE_TABLET | Freq: Every day | ORAL | 0 refills | Status: DC

## 2016-05-10 MED ORDER — FUROSEMIDE 40 MG PO TABS: 40.0000 mg | ORAL_TABLET | Freq: Two times a day (BID) | ORAL | 0 refills | Status: DC

## 2016-05-10 MED ORDER — FUROSEMIDE 40 MG PO TABS: 40.0000 mg | ORAL_TABLET | Freq: Every day | ORAL | Status: DC

## 2016-05-10 MED ORDER — FUROSEMIDE 40 MG PO TABS: 40.0000 mg | ORAL_TABLET | Freq: Every day | ORAL | 0 refills | Status: DC

## 2016-05-10 NOTE — Progress Notes (Signed)
Progress Note  Patient Name: Mark Rivers Date of Encounter: 05/10/2016  Primary Cardiologist: Dr. Johnsie Cancel  Subjective   Continues to diurese well on PO lasix. He is excited to started his new nutrition plan and has written down some meal ideas that he plans to make.   Inpatient Medications    Scheduled Meds: . aspirin  325 mg Oral Daily  . atorvastatin  80 mg Oral Daily  . enoxaparin (LOVENOX) injection  40 mg Subcutaneous Q24H  . fenofibrate  54 mg Oral Daily  . furosemide  40 mg Oral BID  . insulin aspart  0-20 Units Subcutaneous TID WC  . insulin glargine  20 Units Subcutaneous QHS  . loratadine  10 mg Oral Daily  . metoprolol succinate  50 mg Oral Daily  . pantoprazole  40 mg Oral Q0600  . potassium chloride  40 mEq Oral Daily  . traZODone  100 mg Oral QHS   Continuous Infusions:  PRN Meds: acetaminophen, albuterol, hydrALAZINE, meclizine, morphine injection, ondansetron (ZOFRAN) IV, technetium TC 10M diethylenetriame-pentaacetic acid   Vital Signs    Vitals:   05/09/16 1330 05/09/16 1925 05/10/16 0615 05/10/16 0749  BP: (!) 143/87 128/76 124/86 127/82  Pulse: 83 80 80 75  Resp: 18 18 18    Temp: 97.5 F (36.4 C) 98.4 F (36.9 C) 97.5 F (36.4 C)   TempSrc: Oral Oral Oral   SpO2: 94% 97% 97%   Weight:   189 lb 12.8 oz (86.1 kg)   Height:        Intake/Output Summary (Last 24 hours) at 05/10/16 0835 Last data filed at 05/10/16 0616  Gross per 24 hour  Intake              860 ml  Output             2550 ml  Net            -1690 ml   Filed Weights   05/08/16 0428 05/09/16 0600 05/10/16 0615  Weight: 205 lb 1.6 oz (93 kg) 193 lb 3.2 oz (87.6 kg) 189 lb 12.8 oz (86.1 kg)    Telemetry    Atrial fibrillation; HR 75  - Personally Reviewed  ECG    No new EKGs since 4/30 - Personally Reviewed  Physical Exam   GEN: Well nourished, well developed HEENT: normal  Neck: no JVD, carotid bruits, or masses Cardiac: RRR. no murmurs, rubs, or gallops,no  edema. Intact distal pulses bilaterally.  Respiratory: clear to auscultation bilaterally, normal work of breathing GI: soft, nontender, nondistended, + BS MS: no deformity or atrophy  Skin: warm and dry, no rash Neuro: Alert and Oriented x 3, Strength and sensation are intact Psych:   Full affect  Labs    Chemistry Recent Labs Lab 05/07/16 0950  05/08/16 0636 05/09/16 0414 05/10/16 0537  NA 138  --  141 139 138  K 4.1  --  3.4* 3.5 3.8  CL 105  --  104 100* 98*  CO2 27  --  29 31 33*  GLUCOSE 173*  --  80 82 99  BUN 35*  --  30* 31* 33*  CREATININE 1.37*  < > 1.53* 1.60* 1.69*  CALCIUM 8.8*  --  9.0 9.1 9.3  PROT 6.9  --   --   --   --   ALBUMIN 3.6  --   --   --   --   AST 33  --   --   --   --  ALT 22  --   --   --   --   ALKPHOS 64  --   --   --   --   BILITOT 0.5  --   --   --   --   GFRNONAA 47*  < > 41* 39* 37*  GFRAA 55*  < > 48* 45* 42*  ANIONGAP 6  --  8 8 7   < > = values in this interval not displayed.   Hematology Recent Labs Lab 05/07/16 0950 05/07/16 1454 05/09/16 0414  WBC 9.5 11.7* 12.2*  RBC 3.20* 3.32* 3.31*  HGB 9.3* 9.2* 9.0*  HCT 29.9* 30.3* 30.1*  MCV 93.4 91.3 90.9  MCH 29.1 27.7 27.2  MCHC 31.1 30.4 29.9*  RDW 15.6* 15.7* 15.6*  PLT 312 284 296    Cardiac Enzymes Recent Labs Lab 05/07/16 0950 05/07/16 1841 05/07/16 2346 05/08/16 0636  TROPONINI 0.04* 0.04* 0.05* 0.06*   No results for input(s): TROPIPOC in the last 168 hours.   BNP Recent Labs Lab 05/07/16 0950  BNP 1,301.2*     DDimer  Recent Labs Lab 05/08/16 1139  DDIMER 1.62*     Radiology    Dg Chest 2 View  Result Date: 05/09/2016 CLINICAL DATA:  Dyspnea, no chest pain EXAM: CHEST  2 VIEW COMPARISON:  05/07/2016 FINDINGS: There is bilateral mild interstitial thickening. There is no focal parenchymal opacity. There are trace bilateral pleural effusions. There is no pneumothorax. There is stable cardiomegaly. There is evidence of prior CABG. The osseous  structures are unremarkable. IMPRESSION: Findings concerning for mild CHF. Electronically Signed   By: Kathreen Devoid   On: 05/09/2016 12:41   Nm Pulmonary Perf And Vent  Result Date: 05/09/2016 CLINICAL DATA:  Shortness of Breath EXAM: NUCLEAR MEDICINE VENTILATION - PERFUSION LUNG SCAN VIEWS: Anterior, posterior, left lateral, right lateral, RPO, LPO, RAO, LAO -ventilation and profusion RADIOPHARMACEUTICALS:  32.7 mCi Technetium-13m DTPA aerosol inhalation and 4.2 mCi Technetium-26m MAA IV COMPARISON:  Chest radiograph May 09, 2016 FINDINGS: Ventilation: There are scattered subsegmental areas of photopenia on the ventilation study. There is no segmental ventilation defect. Perfusion: There are scattered subsegmental areas of photopenia on the perfusion study which match ventilation defects. There is no appreciable ventilation/ perfusion mismatch. There is no segmental perfusion defect. There is cardiomegaly. IMPRESSION: There are several small subsegmental matching ventilation and perfusion defects. There is no appreciable ventilation/perfusion mismatch. No segmental or majority subsegmental perfusion defect evident. This study constitutes a low probability of pulmonary embolus. Cardiomegaly is present as noted on chest radiograph. Electronically Signed   By: Lowella Grip III M.D.   On: 05/09/2016 13:10    Cardiac Studies   2D Echo 05/07/16 - Left ventricle: The cavity size was normal. Wall thickness was increased in a pattern of moderate LVH. Systolic function was normal. The estimated ejection fraction was in the range of 55% to 60%. Wall motion was normal; there were no regional wall motion abnormalities. - Aortic valve: There was mild stenosis. There was trivial regurgitation. Valve area (VTI): 1.56 cm^2. Valve area (Vmax): 1.62 cm^2. Valve area (Vmean): 1.58 cm^2. - Mitral valve: Calcified annulus. There was mild regurgitation. - Left atrium: The atrium was severely dilated. -  Right atrium: The atrium was moderately dilated. - Pulmonary arteries: Systolic pressure was moderately increased. PA peak pressure: 55 mm Hg (S). Impressions: - Normal LV systolic function; moderate LVH; calcified aortic valve with mild AS and trace AI; mild MR; biatrial enlargement; mild TR with  moderately elevated pulmonary pressure.  Patient Profile     Mark Rivers a 82 y.o.malewith a history of CAD (remote angioplasty 1988, NSTEMI 10/2015 in setting of AF RVR with resultant emergent CABG with L-LAD, S-OM2, S-PDA with complicated admission thereafter), persistent atrial fib, sinus bradycardia (prior HR 54 when in normal rhythm 10/2015), CKD III, HTN, HLD, COPD with chronic respiratory failure on home O2, MRSE bacteremia, chronic diastolic CHF, GIB, anemia, DM, HLD, neuropathy, remote strokes by MRI, recently diagnosed esophageal cancer whom we are asked to see by Dr. Nicole Cella shortness of breath.  Assessment & Plan    Today's Weight: 189 lb Admission Weight:  200 lb Net loss: 7.9 Liters Blood Pressure: 127/82 Hr: 75 Labs:  Creatinine 1.69, K 3.8, Na  138 , WBC n/a, Hbg n/a   1. Dyspnea - Multifactoral in setting of CHF, COPD, AF:   2. Acute on chronic diastolic CHF: Has continued to diurese well on Lasix  40 mg BID po. He has lost another 1.5 liters. Unfortunately, his creatinine has continued to climb from 1.6 --> 1.69.  I think with close follow-up he is stable enough for discharge today with repeat BMP within 1 week since his baseline is between 1.5 and 1.8     -- I will need to discuss this with my attending. -- Continue potassium  -- discussed limiting salt to 2g and fluid to 2L  3. Persistent atrial fibrillation - CHADSVASC = 8 (note occult strokes seen on CT this admission) Elevated rate could also be contributing to the above. Currently heart rate is in the 80's, per chart his AF control will be through rate control, as we are not able to anticoagulate  given ongoing management of his esophageal cancer.  4. Dizziness- possibly vertiginous in nature, resolved.  Medicine to manage.  5. CAD s/p remote PTCA, recent CABG 10/2015 with complex hospital course - minimal trop elevation is likely demand process. Continue aspirin, statin, beta blocker.  (0.04--> 0./05 --> 0.06)  6. CKD stage III -per nephrology note, baseline Cr 1.5-1.8. May need to run Cr slightly higher to achieve euvolemia.    Kristopher Glee, PA-C  05/10/2016, 8:35 AM

## 2016-05-10 NOTE — Progress Notes (Signed)
Pt refused lunch CBG, therefor no insulin coverage given

## 2016-05-10 NOTE — Care Management Note (Signed)
Case Management Note  Patient Details  Name: Mark Rivers MRN: [id number] Date of Birth: 12/27/1934  Subjective/Objective:       Admitted with Acute on Chronic Resp Failure            Action/Plan: Patient lives at home alone; PCP: [name]., MD; has private insurance with Medicare/[employer] with prescription drug coverage; [place] choice offered, pt chose [facility]; Butch Penny with [facility] called for arrangements; Patient stated that his daughter from out of town will be staying him for a while; DME - home oxygen, rolling walker at home; he also stated that he will stop eating out a lot and will cook more.  Expected Discharge Date:  05/10/16               Expected Discharge Plan:  [place]  Discharge planning Services  CM Consult  Choice offered to:  Patient  HH Arranged:  RN, PT [facility] Agency:  [place]  Status of Service:  In process, will continue to follow  Sherrilyn Rist [phone] 05/10/2016, 10:06 AM

## 2016-05-10 NOTE — Progress Notes (Signed)
Stopped MD in hallway to inform of pt having 7 beats of Vtach this morning at 0720. MD aware, no new orders. Will continue to monitor

## 2016-05-10 NOTE — Telephone Encounter (Signed)
Patient was seen at [facility] by Dr. Adria Devon on 03/27/16

## 2016-05-10 NOTE — Discharge Summary (Addendum)
PATIENT DETAILS Name: Mark Rivers Age: 82 y.o. Sex: male Date of Birth: December 22, 1934 MRN: [id number]. Admitting Physician: Elwin Mocha, MD IZT:[id number] Jerilynn Mages., MD  Admit Date: 05/07/2016 Discharge date: 05/10/2016  Recommendations for Outpatient Follow-up:  1. Follow up with PCP in 1-2 weeks 2. Please obtain BMP/CBC in one week 3. Please follow up with Oncology at [facility] and Cardiology  Admitted From:  Home with home health service  Disposition: Home with home health services   Home Health:  Yes  Equipment/Devices: Resume oxygen 2L  Discharge Condition: Stable  CODE STATUS: FULL CODE  Diet recommendation:  Heart Healthy / Carb Modified  Brief Summary: See H&P, Labs, Consult and Test reports for all details in brief, patient is a 82 year old male with history of chronic hypoxemic respiratory failure on 2 L of oxygen at home, COPD, chronic atrial fibrillation no longer on anticoagulation (recent history of GI bleeding-taken off Coumadin) admitted with worsening acute on chronic respiratory failure secondary to acute on chronic diastolic heart failure. See below for further details  Brief Hospital Course: Acute on chronic hypoxemic respiratory failure: Felt to be mostly due to acute on chronic diastolic CHF, with COPD and atrial fibrillation contributing. Patient was admitted and started on IV Lasix. He was also provided other supportive measures. He is significantly improved, and is back to his baseline symptoms of respiratory failure-he is currently thought to be stable to be discharged back home with home health services. Note, VQ scan low probability  Acute on chronic diastolic heart failure: Likely causing above, started on IV Lasix-significantly improved-volume statusstable. -7.9 L so far, her weight decreased to 189 pounds (204 pounds on admission). He has been transitioned to oral diuretic regimen on 5/1, creatinine mildly elevated but still very close to his usual  baseline. Stable for discharge with close outpatient follow-up. Note, 2-D echo on 4/29 showed EF of 55-60%  Persistent atrial fibrillation with RVR: Rate appears stable-CHADSVASC = 8. Not deemed to be a anticoagulation candidate-due to recent history of GI bleeding and underlying esophageal cancer. Claims he was recently taken off Coumadin prior to this admission by his outpatient MD's.  Vertigo: Suspect peripheral vertigo-seems to have resolved. MRI brain negative for acute infarct.  Chronic kidney disease stage III: Stable-and creatinine close to usual baseline. Continue to follow closely in the outpatient setting.  Insulin-dependent type 2 diabetes: CBGs stable-resume usual insulin regimen on discharge  COPD on home O2 2 L/m: Stable-lungs are currently clear.  History of esophageal cancer (adenocarcinoma): Recently diagnosed-followed by oncology at [facility] he has an appointment either in June or July for a follow-up visit.  History of CAD status post CABG: Continue aspirin, statin and beta blocker.  Procedures/Studies: Echo 0/53>> Normal LV systolic function; moderate LVH; calcified aortic valve  with mild AS and trace AI; mild MR; biatrial enlargement; mild TR with moderately elevated pulmonary pressure.  Discharge Diagnoses:  Principal Problem:   Acute on chronic respiratory failure (HCC) Active Problems:   CKD (chronic kidney disease) stage 3, GFR 30-59 ml/min   Diabetes mellitus (HCC)   Hyperlipemia   Persistent atrial fibrillation (HCC)   Coronary artery disease involving native coronary artery of native heart without angina pectoris   Essential hypertension   Hypokalemia   Vertigo  Discharge Instructions:  Activity:  As tolerated with Full fall precautions use walker/cane & assistance as needed  Discharge Instructions    (HEART FAILURE PATIENTS) Call MD:  Anytime you have any of the following symptoms: 1) 3 pound weight  gain in 24 hours or 5 pounds in 1 week 2)  shortness of breath, with or without a dry hacking cough 3) swelling in the hands, feet or stomach 4) if you have to sleep on extra pillows at night in order to breathe.    Complete by:  As directed    Diet - low sodium heart healthy    Complete by:  As directed    Increase activity slowly    Complete by:  As directed      Allergies as of 05/10/2016      Reactions   Amoxicillin Other (See Comments)   PATIENT PASSED OUT   Cephalexin Other (See Comments)   PATIENT PASSED OUT   Penicillin G Other (See Comments)   PATIENT PASSED OUT. Has patient had a PCN reaction causing immediate rash, facial/tongue/throat swelling, SOB or lightheadedness with hypotension: Yes Has patient had a PCN reaction causing severe rash involving mucus membranes or skin necrosis: No Has patient had a PCN reaction that required hospitalization: Yes Has patient had a PCN reaction occurring within the last 10 years: Yes If all of the above answers are "NO", then may proceed with Cephalosporin      Medication List    TAKE these medications   aspirin EC 81 MG tablet Take 81 mg by mouth daily.   atorvastatin 80 MG tablet Commonly known as:  LIPITOR Take 1 tablet (80 mg total) by mouth daily.   cholecalciferol 1000 units tablet Commonly known as:  VITAMIN D Take 1,000 Units by mouth daily.   diphenhydramine-acetaminophen 25-500 MG Tabs tablet Commonly known as:  TYLENOL PM Take 2 tablets by mouth at bedtime.   fenofibrate 54 MG tablet Take 54 mg by mouth daily.   furosemide 40 MG tablet Commonly known as:  LASIX Take 1 tablet (40 mg total) by mouth daily.   GLUCOSAMINE CHONDR 1500 COMPLX PO Take 2 tablets by mouth daily.   loratadine 10 MG tablet Commonly known as:  CLARITIN Take 1 tablet (10 mg total) by mouth daily.   metoprolol succinate 50 MG 24 hr tablet Commonly known as:  TOPROL-XL Take 1 tablet (50 mg total) by mouth daily. Take with or immediately following a meal.   OXYGEN Inhale 2  L/min into the lungs continuous.   pantoprazole 40 MG tablet Commonly known as:  PROTONIX Take 1 tablet (40 mg total) by mouth daily at 6 (six) AM. What changed:  when to take this   potassium chloride SA 20 MEQ tablet Commonly known as:  K-DUR,KLOR-CON Take 2 tablets (40 mEq total) by mouth daily.   TOUJEO SOLOSTAR 300 UNIT/ML Sopn Generic drug:  Insulin Glargine Inject 30 Units into the skin at bedtime.   traZODone 100 MG tablet Commonly known as:  DESYREL Take 100 mg by mouth at bedtime.   vitamin C 500 MG tablet Commonly known as:  ASCORBIC ACID Take 500 mg by mouth daily.      Follow-up Information    [name]., MD. Schedule an appointment as soon as possible for a visit in 1 week(s).   Specialty:  Family Medicine Contact information: [address] [place] [address] [phone]        Follow with Oncologist at [facility] Follow up.   Why:  at next scheduled appt       [facility] Follow up.   Why:  They willl do your home health care at your home Contact information: [address]  [place] [address] [place], NP. Go on 05/17/2016.   Specialties:  Nurse Practitioner, Interventional Cardiology, Cardiology, Radiology Why:  You have a follow-up appointment with a Nurse Practitioner that works with Dr. Johnsie Cancel. The appoint is scheduled for 8:30 am on May 9. Please arrive 10-15 minutes early. Contact information: [place]. [address] [place] [address] [phone]          Allergies  Allergen Reactions  . Amoxicillin Other (See Comments)    PATIENT PASSED OUT   . Cephalexin Other (See Comments)    PATIENT PASSED OUT  . Penicillin G Other (See Comments)    PATIENT PASSED OUT. Has patient had a PCN reaction causing immediate rash, facial/tongue/throat swelling, SOB or lightheadedness with hypotension: Yes Has patient had a PCN reaction causing severe rash involving  mucus membranes or skin necrosis: No Has patient had a PCN reaction that required hospitalization: Yes Has patient had a PCN reaction occurring within the last 10 years: Yes If all of the above answers are "NO", then may proceed with Cephalosporin    Consultations:   cardiology  Other Procedures/Studies: Dg Chest 2 View  Result Date: 05/09/2016 CLINICAL DATA:  Dyspnea, no chest pain EXAM: CHEST  2 VIEW COMPARISON:  05/07/2016 FINDINGS: There is bilateral mild interstitial thickening. There is no focal parenchymal opacity. There are trace bilateral pleural effusions. There is no pneumothorax. There is stable cardiomegaly. There is evidence of prior CABG. The osseous structures are unremarkable. IMPRESSION: Findings concerning for mild CHF. Electronically Signed   By: Kathreen Devoid   On: 05/09/2016 12:41   Dg Chest 2 View  Result Date: 05/07/2016 CLINICAL DATA:  Shortness of breath and dizziness EXAM: CHEST  2 VIEW COMPARISON:  March 31, 2016 FINDINGS: There is a small pleural effusion on the left. There is no edema or consolidation. Heart is mildly enlarged with pulmonary venous hypertension. No adenopathy. Patient is status post coronary artery bypass grafting. IMPRESSION: Pulmonary vascular congestion. No frank edema or consolidation. Small left pleural effusion. Electronically Signed   By: Lowella Grip III M.D.   On: 05/07/2016 10:51   Ct Head Wo Contrast  Result Date: 05/07/2016 CLINICAL DATA:  Dizziness began yesterday.  Shortness of breath. EXAM: CT HEAD WITHOUT CONTRAST TECHNIQUE: Contiguous axial images were obtained from the base of the skull through the vertex without intravenous contrast. COMPARISON:  CT head 04/10/2003. FINDINGS: Brain: No evidence for acute infarction, hemorrhage, mass lesion, hydrocephalus, or extra-axial fluid. Moderate cerebral and cerebellar atrophy, has progressed since 2005. Hypoattenuation of white matter favored to represent small vessel disease. Vascular:  Advanced vascular calcification in the carotid siphons. No proximal hyperdense vessel to suggest emergent vascular occlusion. Skull: Normal. Negative for fracture or focal lesion. Sinuses/Orbits: No acute finding. Other: None. IMPRESSION: Atrophy and small vessel disease have progressed since 2005. There are no acute intracranial findings observed. Electronically Signed   By: Staci Righter M.D.   On: 05/07/2016 10:53   Mr Jodene Nam Head Wo Contrast  Result Date: 05/08/2016 CLINICAL DATA:  Vertigo, dyspnea on exertion. History of hypertension, hyperlipidemia, diabetes, atrial fibrillation. EXAM: MRI HEAD WITHOUT CONTRAST MRA HEAD WITHOUT CONTRAST TECHNIQUE: Multiplanar, multiecho pulse sequences of the brain and surrounding structures were obtained without intravenous contrast. Angiographic images of the head were obtained using MRA technique without contrast. COMPARISON:  CT HEAD May 07, 2016 FINDINGS: MRI HEAD FINDINGS BRAIN: No reduced diffusion to suggest acute ischemia. No susceptibility artifact to  suggest hemorrhage. The ventricles and sulci are normal for patient's age. 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 Patchy supratentorial white matter FLAIR T2 hyperintensities. No suspicious parenchymal signal, masses or mass effect. No abnormal extra-axial fluid collections. VASCULAR: Attenuated RIGHT distal V4 segment, see below. SKULL AND UPPER CERVICAL SPINE: No abnormal sellar expansion. No suspicious calvarial bone marrow signal. Craniocervical junction maintained. SINUSES/ORBITS: Minimal paranasal sinus mucosal thickening. Mastoid air cells are well aerated. The included ocular globes and orbital contents are non-suspicious. Status post bilateral ocular lens implants. OTHER: None. MRA HEAD FINDINGS- mild motion degraded examination. ANTERIOR CIRCULATION: Normal flow related enhancement of the included cervical, petrous, cavernous and supraclinoid internal carotid arteries. Patent anterior communicating artery. Normal flow  related enhancement of the anterior and middle cerebral arteries, including distal segments. No large vessel occlusion, high-grade stenosis, abnormal luminal irregularity, aneurysm. POSTERIOR CIRCULATION: Loss of RIGHT vertebral artery flow related enhancement distal to the RIGHT posterior-inferior cerebellar artery territory origin. Basilar artery is patent, with normal flow related enhancement of the main branch vessels. Normal flow related enhancement of the posterior cerebral arteries. Fetal origin RIGHT posterior cerebral artery. Robust LEFT posterior communicating artery present. No large vessel occlusion, high-grade stenosis, abnormal luminal irregularity, aneurysm. ANATOMIC VARIANTS: None. IMPRESSION: MRI HEAD: No acute intracranial process. Moderate white matter changes.  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 MRA HEAD: Slow flow versus occluded RIGHT vertebral artery distal to PICA origin. Complete circle of Willis. Electronically Signed   By: Elon Alas M.D.   On: 05/08/2016 06:16   Mr Brain Wo Contrast  Result Date: 05/08/2016 CLINICAL DATA:  Vertigo, dyspnea on exertion. History of hypertension, hyperlipidemia, diabetes, atrial fibrillation. EXAM: MRI HEAD WITHOUT CONTRAST MRA HEAD WITHOUT CONTRAST TECHNIQUE: Multiplanar, multiecho pulse sequences of the brain and surrounding structures were obtained without intravenous contrast. Angiographic images of the head were obtained using MRA technique without contrast. COMPARISON:  CT HEAD May 07, 2016 FINDINGS: MRI HEAD FINDINGS BRAIN: No reduced diffusion to suggest acute ischemia. No susceptibility artifact to suggest hemorrhage. The ventricles and sulci are normal for patient's age. 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 Patchy supratentorial white matter FLAIR T2 hyperintensities. No suspicious parenchymal signal, masses or mass effect. No abnormal extra-axial fluid collections. VASCULAR: Attenuated RIGHT distal V4 segment, see below. SKULL AND UPPER CERVICAL SPINE:  No abnormal sellar expansion. No suspicious calvarial bone marrow signal. Craniocervical junction maintained. SINUSES/ORBITS: Minimal paranasal sinus mucosal thickening. Mastoid air cells are well aerated. The included ocular globes and orbital contents are non-suspicious. Status post bilateral ocular lens implants. OTHER: None. MRA HEAD FINDINGS- mild motion degraded examination. ANTERIOR CIRCULATION: Normal flow related enhancement of the included cervical, petrous, cavernous and supraclinoid internal carotid arteries. Patent anterior communicating artery. Normal flow related enhancement of the anterior and middle cerebral arteries, including distal segments. No large vessel occlusion, high-grade stenosis, abnormal luminal irregularity, aneurysm. POSTERIOR CIRCULATION: Loss of RIGHT vertebral artery flow related enhancement distal to the RIGHT posterior-inferior cerebellar artery territory origin. Basilar artery is patent, with normal flow related enhancement of the main branch vessels. Normal flow related enhancement of the posterior cerebral arteries. Fetal origin RIGHT posterior cerebral artery. Robust LEFT posterior communicating artery present. No large vessel occlusion, high-grade stenosis, abnormal luminal irregularity, aneurysm. ANATOMIC VARIANTS: None. IMPRESSION: MRI HEAD: No acute intracranial process. Moderate white matter changes.  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 MRA HEAD: Slow flow versus occluded RIGHT vertebral artery distal to PICA origin. Complete circle of Willis. Electronically Signed   By: Elon Alas M.D.   On: 05/08/2016 06:16  Nm Pulmonary Perf And Vent  Result Date: 05/09/2016 CLINICAL DATA:  Shortness of Breath EXAM: NUCLEAR MEDICINE VENTILATION - PERFUSION LUNG SCAN VIEWS: Anterior, posterior, left lateral, right lateral, RPO, LPO, RAO, LAO -ventilation and profusion RADIOPHARMACEUTICALS:  32.7 mCi Technetium-50m DTPA aerosol inhalation and 4.2 mCi Technetium-71m MAA IV COMPARISON:   Chest radiograph May 09, 2016 FINDINGS: Ventilation: There are scattered subsegmental areas of photopenia on the ventilation study. There is no segmental ventilation defect. Perfusion: There are scattered subsegmental areas of photopenia on the perfusion study which match ventilation defects. There is no appreciable ventilation/ perfusion mismatch. There is no segmental perfusion defect. There is cardiomegaly. IMPRESSION: There are several small subsegmental matching ventilation and perfusion defects. There is no appreciable ventilation/perfusion mismatch. No segmental or majority subsegmental perfusion defect evident. This study constitutes a low probability of pulmonary embolus. Cardiomegaly is present as noted on chest radiograph. Electronically Signed   By: Lowella Grip III M.D.   On: 05/09/2016 13:10    TODAY-DAY OF DISCHARGE:  Subjective:   Mark Rivers today has no headache,no chest abdominal pain,no new weakness tingling or numbness, feels much better wants to go home today.   Objective:   Blood pressure 127/82, pulse 75, temperature 97.5 F (36.4 C), temperature source Oral, resp. rate 18, height 5\' 7"  (1.702 m), weight 86.1 kg (189 lb 12.8 oz), SpO2 97 %.  Intake/Output Summary (Last 24 hours) at 05/10/16 1050 Last data filed at 05/10/16 1000  Gross per 24 hour  Intake              960 ml  Output             2600 ml  Net            -1640 ml   Filed Weights   05/08/16 0428 05/09/16 0600 05/10/16 0615  Weight: 93 kg (205 lb 1.6 oz) 87.6 kg (193 lb 3.2 oz) 86.1 kg (189 lb 12.8 oz)    Exam: Awake Alert, Oriented *3, No new F.N deficits, Normal affect [place].AT,PERRAL Supple Neck,No JVD, No cervical lymphadenopathy appriciated.  Symmetrical Chest wall movement, Good air movement bilaterally, CTAB RRR,No Gallops,Rubs or new Murmurs, No Parasternal Heave +ve B.Sounds, Abd Soft, Non tender, No organomegaly appriciated, No rebound -guarding or rigidity. No Cyanosis, Clubbing or  edema, No new Rash or bruise   PERTINENT RADIOLOGIC STUDIES: Dg Chest 2 View  Result Date: 05/09/2016 CLINICAL DATA:  Dyspnea, no chest pain EXAM: CHEST  2 VIEW COMPARISON:  05/07/2016 FINDINGS: There is bilateral mild interstitial thickening. There is no focal parenchymal opacity. There are trace bilateral pleural effusions. There is no pneumothorax. There is stable cardiomegaly. There is evidence of prior CABG. The osseous structures are unremarkable. IMPRESSION: Findings concerning for mild CHF. Electronically Signed   By: Kathreen Devoid   On: 05/09/2016 12:41   Dg Chest 2 View  Result Date: 05/07/2016 CLINICAL DATA:  Shortness of breath and dizziness EXAM: CHEST  2 VIEW COMPARISON:  March 31, 2016 FINDINGS: There is a small pleural effusion on the left. There is no edema or consolidation. Heart is mildly enlarged with pulmonary venous hypertension. No adenopathy. Patient is status post coronary artery bypass grafting. IMPRESSION: Pulmonary vascular congestion. No frank edema or consolidation. Small left pleural effusion. Electronically Signed   By: Lowella Grip III M.D.   On: 05/07/2016 10:51   Ct Head Wo Contrast  Result Date: 05/07/2016 CLINICAL DATA:  Dizziness began yesterday.  Shortness of breath. EXAM: CT HEAD WITHOUT CONTRAST  TECHNIQUE: Contiguous axial images were obtained from the base of the skull through the vertex without intravenous contrast. COMPARISON:  CT head 04/10/2003. FINDINGS: Brain: No evidence for acute infarction, hemorrhage, mass lesion, hydrocephalus, or extra-axial fluid. Moderate cerebral and cerebellar atrophy, has progressed since 2005. Hypoattenuation of white matter favored to represent small vessel disease. Vascular: Advanced vascular calcification in the carotid siphons. No proximal hyperdense vessel to suggest emergent vascular occlusion. Skull: Normal. Negative for fracture or focal lesion. Sinuses/Orbits: No acute finding. Other: None. IMPRESSION: Atrophy and  small vessel disease have progressed since 2005. There are no acute intracranial findings observed. Electronically Signed   By: Staci Righter M.D.   On: 05/07/2016 10:53   Mr Jodene Nam Head Wo Contrast  Result Date: 05/08/2016 CLINICAL DATA:  Vertigo, dyspnea on exertion. History of hypertension, hyperlipidemia, diabetes, atrial fibrillation. EXAM: MRI HEAD WITHOUT CONTRAST MRA HEAD WITHOUT CONTRAST TECHNIQUE: Multiplanar, multiecho pulse sequences of the brain and surrounding structures were obtained without intravenous contrast. Angiographic images of the head were obtained using MRA technique without contrast. COMPARISON:  CT HEAD May 07, 2016 FINDINGS: MRI HEAD FINDINGS BRAIN: No reduced diffusion to suggest acute ischemia. No susceptibility artifact to suggest hemorrhage. The ventricles and sulci are normal for patient's age. 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 Patchy supratentorial white matter FLAIR T2 hyperintensities. No suspicious parenchymal signal, masses or mass effect. No abnormal extra-axial fluid collections. VASCULAR: Attenuated RIGHT distal V4 segment, see below. SKULL AND UPPER CERVICAL SPINE: No abnormal sellar expansion. No suspicious calvarial bone marrow signal. Craniocervical junction maintained. SINUSES/ORBITS: Minimal paranasal sinus mucosal thickening. Mastoid air cells are well aerated. The included ocular globes and orbital contents are non-suspicious. Status post bilateral ocular lens implants. OTHER: None. MRA HEAD FINDINGS- mild motion degraded examination. ANTERIOR CIRCULATION: Normal flow related enhancement of the included cervical, petrous, cavernous and supraclinoid internal carotid arteries. Patent anterior communicating artery. Normal flow related enhancement of the anterior and middle cerebral arteries, including distal segments. No large vessel occlusion, high-grade stenosis, abnormal luminal irregularity, aneurysm. POSTERIOR CIRCULATION: Loss of RIGHT vertebral artery flow related  enhancement distal to the RIGHT posterior-inferior cerebellar artery territory origin. Basilar artery is patent, with normal flow related enhancement of the main branch vessels. Normal flow related enhancement of the posterior cerebral arteries. Fetal origin RIGHT posterior cerebral artery. Robust LEFT posterior communicating artery present. No large vessel occlusion, high-grade stenosis, abnormal luminal irregularity, aneurysm. ANATOMIC VARIANTS: None. IMPRESSION: MRI HEAD: No acute intracranial process. Moderate white matter changes.  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 MRA HEAD: Slow flow versus occluded RIGHT vertebral artery distal to PICA origin. Complete circle of Willis. Electronically Signed   By: Elon Alas M.D.   On: 05/08/2016 06:16   Mr Brain Wo Contrast  Result Date: 05/08/2016 CLINICAL DATA:  Vertigo, dyspnea on exertion. History of hypertension, hyperlipidemia, diabetes, atrial fibrillation. EXAM: MRI HEAD WITHOUT CONTRAST MRA HEAD WITHOUT CONTRAST TECHNIQUE: Multiplanar, multiecho pulse sequences of the brain and surrounding structures were obtained without intravenous contrast. Angiographic images of the head were obtained using MRA technique without contrast. COMPARISON:  CT HEAD May 07, 2016 FINDINGS: MRI HEAD FINDINGS BRAIN: No reduced diffusion to suggest acute ischemia. No susceptibility artifact to suggest hemorrhage. The ventricles and sulci are normal for patient's age. 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 Patchy supratentorial white matter FLAIR T2 hyperintensities. No suspicious parenchymal signal, masses or mass effect. No abnormal extra-axial fluid collections. VASCULAR: Attenuated RIGHT distal V4 segment, see below. SKULL AND UPPER CERVICAL SPINE: No abnormal sellar expansion. No suspicious calvarial  bone marrow signal. Craniocervical junction maintained. SINUSES/ORBITS: Minimal paranasal sinus mucosal thickening. Mastoid air cells are well aerated. The included ocular globes and orbital  contents are non-suspicious. Status post bilateral ocular lens implants. OTHER: None. MRA HEAD FINDINGS- mild motion degraded examination. ANTERIOR CIRCULATION: Normal flow related enhancement of the included cervical, petrous, cavernous and supraclinoid internal carotid arteries. Patent anterior communicating artery. Normal flow related enhancement of the anterior and middle cerebral arteries, including distal segments. No large vessel occlusion, high-grade stenosis, abnormal luminal irregularity, aneurysm. POSTERIOR CIRCULATION: Loss of RIGHT vertebral artery flow related enhancement distal to the RIGHT posterior-inferior cerebellar artery territory origin. Basilar artery is patent, with normal flow related enhancement of the main branch vessels. Normal flow related enhancement of the posterior cerebral arteries. Fetal origin RIGHT posterior cerebral artery. Robust LEFT posterior communicating artery present. No large vessel occlusion, high-grade stenosis, abnormal luminal irregularity, aneurysm. ANATOMIC VARIANTS: None. IMPRESSION: MRI HEAD: No acute intracranial process. Moderate white matter changes.  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 MRA HEAD: Slow flow versus occluded RIGHT vertebral artery distal to PICA origin. Complete circle of Willis. Electronically Signed   By: Elon Alas M.D.   On: 05/08/2016 06:16   Nm Pulmonary Perf And Vent  Result Date: 05/09/2016 CLINICAL DATA:  Shortness of Breath EXAM: NUCLEAR MEDICINE VENTILATION - PERFUSION LUNG SCAN VIEWS: Anterior, posterior, left lateral, right lateral, RPO, LPO, RAO, LAO -ventilation and profusion RADIOPHARMACEUTICALS:  32.7 mCi Technetium-52m DTPA aerosol inhalation and 4.2 mCi Technetium-75m MAA IV COMPARISON:  Chest radiograph May 09, 2016 FINDINGS: Ventilation: There are scattered subsegmental areas of photopenia on the ventilation study. There is no segmental ventilation defect. Perfusion: There are scattered subsegmental areas of photopenia on the  perfusion study which match ventilation defects. There is no appreciable ventilation/ perfusion mismatch. There is no segmental perfusion defect. There is cardiomegaly. IMPRESSION: There are several small subsegmental matching ventilation and perfusion defects. There is no appreciable ventilation/perfusion mismatch. No segmental or majority subsegmental perfusion defect evident. This study constitutes a low probability of pulmonary embolus. Cardiomegaly is present as noted on chest radiograph. Electronically Signed   By: Lowella Grip III M.D.   On: 05/09/2016 13:10     PERTINENT LAB RESULTS: CBC:  Recent Labs  05/07/16 1454 05/09/16 0414  WBC 11.7* 12.2*  HGB 9.2* 9.0*  HCT 30.3* 30.1*  PLT 284 296   CMET CMP     Component Value Date/Time   NA 138 05/10/2016 0537   NA 145 (H) 02/16/2016 1611   K 3.8 05/10/2016 0537   CL 98 (L) 05/10/2016 0537   CO2 33 (H) 05/10/2016 0537   GLUCOSE 99 05/10/2016 0537   BUN 33 (H) 05/10/2016 0537   BUN 28 (H) 02/16/2016 1611   CREATININE 1.69 (H) 05/10/2016 0537   CREATININE 1.90 (H) 10/20/2015 1606   CALCIUM 9.3 05/10/2016 0537   PROT 6.9 05/07/2016 0950   ALBUMIN 3.6 05/07/2016 0950   AST 33 05/07/2016 0950   ALT 22 05/07/2016 0950   ALKPHOS 64 05/07/2016 0950   BILITOT 0.5 05/07/2016 0950   GFRNONAA 37 (L) 05/10/2016 0537   GFRAA 42 (L) 05/10/2016 0537    GFR Estimated Creatinine Clearance: 36.5 mL/min (A) (by C-G formula based on SCr of 1.69 mg/dL (H)). No results for input(s): LIPASE, AMYLASE in the last 72 hours.  Recent Labs  05/07/16 1841 05/07/16 2346 05/08/16 0636  TROPONINI 0.04* 0.05* 0.06*   Invalid input(s): POCBNP  Recent Labs  05/08/16 1139  DDIMER 1.62*   No  results for input(s): HGBA1C in the last 72 hours. No results for input(s): CHOL, HDL, LDLCALC, TRIG, CHOLHDL, LDLDIRECT in the last 72 hours. No results for input(s): TSH, T4TOTAL, T3FREE, THYROIDAB in the last 72 hours.  Invalid input(s):  FREET3 No results for input(s): VITAMINB12, FOLATE, FERRITIN, TIBC, IRON, RETICCTPCT in the last 72 hours. Coags: No results for input(s): INR in the last 72 hours.  Invalid input(s): PT Microbiology: No results found for this or any previous visit (from the past 240 hour(s)).  FURTHER DISCHARGE INSTRUCTIONS:  Get Medicines reviewed and adjusted: Please take all your medications with you for your next visit with your Primary MD  Laboratory/radiological data: Please request your Primary MD to go over all hospital tests and procedure/radiological results at the follow up, please ask your Primary MD to get all Hospital records sent to his/her office.  In some cases, they will be blood work, cultures and biopsy results pending at the time of your discharge. Please request that your primary care M.D. goes through all the records of your hospital data and follows up on these results.  Also Note the following: If you experience worsening of your admission symptoms, develop shortness of breath, life threatening emergency, suicidal or homicidal thoughts you must seek medical attention immediately by calling 911 or calling your MD immediately  if symptoms less severe.  You must read complete instructions/literature along with all the possible adverse reactions/side effects for all the Medicines you take and that have been prescribed to you. Take any new Medicines after you have completely understood and accpet all the possible adverse reactions/side effects.   Do not drive when taking Pain medications or sleeping medications (Benzodaizepines)  Do not take more than prescribed Pain, Sleep and Anxiety Medications. It is not advisable to combine anxiety,sleep and pain medications without talking with your primary care practitioner  Special Instructions: If you have smoked or chewed Tobacco  in the last 2 yrs please stop smoking, stop any regular Alcohol  and or any Recreational drug use.  Wear Seat  belts while driving.  Please note: You were cared for by a hospitalist during your hospital stay. Once you are discharged, your primary care physician will handle any further medical issues. Please note that NO REFILLS for any discharge medications will be authorized once you are discharged, as it is imperative that you return to your primary care physician (or establish a relationship with a primary care physician if you do not have one) for your post hospital discharge needs so that they can reassess your need for medications and monitor your lab values.  Total Time spent coordinating discharge including counseling, education and face to face time equals  45 minutes.  Signed: Shanker Ghimire 05/10/2016 10:50 AM

## 2016-05-10 NOTE — Progress Notes (Signed)
Pt IV discontinued, catheter intact and telemetry removed. Pt has all belongings, discharge paperwork and prescriptions. Pt has home oxygen with him. Pt discharge via wheelchair with family member and Therapist, sports. Pt in car safely and hooked on his home oxygen tank.  Mark Rivers [employer]

## 2016-05-11 ENCOUNTER — Telehealth: Payer: Self-pay | Admitting: Cardiovascular Disease

## 2016-05-11 NOTE — Telephone Encounter (Signed)
New message    Pt is calling to ask how much water he needs to be drinking.

## 2016-05-11 NOTE — Telephone Encounter (Signed)
Referred patient to his PCP for questions on fluid intake and oxygen. Patient verbalized understanding and will call his PCP.

## 2016-05-17 ENCOUNTER — Encounter: Payer: Self-pay | Admitting: Pulmonary Disease

## 2016-05-17 ENCOUNTER — Ambulatory Visit: Payer: Medicare Other | Admitting: Nurse Practitioner

## 2016-05-17 ENCOUNTER — Ambulatory Visit (INDEPENDENT_AMBULATORY_CARE_PROVIDER_SITE_OTHER): Payer: Medicare Other | Admitting: Pulmonary Disease

## 2016-05-17 VITALS — BP 128/72 | HR 112 | Ht 67.0 in | Wt 193.0 lb

## 2016-05-17 DIAGNOSIS — J432 Centrilobular emphysema: Secondary | ICD-10-CM | POA: Diagnosis not present

## 2016-05-17 DIAGNOSIS — I272 Pulmonary hypertension, unspecified: Secondary | ICD-10-CM

## 2016-05-17 DIAGNOSIS — R0609 Other forms of dyspnea: Secondary | ICD-10-CM

## 2016-05-17 DIAGNOSIS — J920 Pleural plaque with presence of asbestos: Secondary | ICD-10-CM

## 2016-05-17 DIAGNOSIS — I2510 Atherosclerotic heart disease of native coronary artery without angina pectoris: Secondary | ICD-10-CM | POA: Diagnosis not present

## 2016-05-17 NOTE — Progress Notes (Signed)
Past surgical history He  has a past surgical history that includes Tonsillectomy; Colonoscopy; Cardiac catheterization (N/A, 10/26/2015); Coronary artery bypass graft (N/A, 10/26/2015); TEE without cardioversion (N/A, 10/26/2015); Cataract extraction w/ intraocular lens  implant, bilateral (2008); Coronary angioplasty with stent (1988); Esophagogastroduodenoscopy (egd) with propofol (N/A, 02/11/2016); and EUS (N/A, 03/09/2016).  Family history His family history includes Diabetes in his father.  Social history He  reports that he quit smoking about 11 years ago. His smoking use included Cigarettes. He has a 60.00 pack-year smoking history. He has never used smokeless tobacco. He reports that he does not drink alcohol or use drugs.  Allergies  Allergen Reactions  . Amoxicillin Other (See Comments)    PATIENT PASSED OUT   . Cephalexin Other (See Comments)    PATIENT PASSED OUT  . Penicillin G Other (See Comments)    PATIENT PASSED OUT. Has patient had a PCN reaction causing immediate rash, facial/tongue/throat swelling, SOB or lightheadedness with hypotension: Yes Has patient had a PCN reaction causing severe rash involving mucus membranes or skin necrosis: No Has patient had a PCN reaction that required hospitalization: Yes Has patient had a PCN reaction occurring within the last 10 years: Yes If all of the above answers are "NO", then may proceed with Cephalosporin    Review of Systems  Constitutional: Negative for fever and unexpected weight change.  HENT: Positive for rhinorrhea and sneezing. Negative for congestion, dental problem, ear pain, nosebleeds, postnasal drip, sinus pressure, sore throat and trouble swallowing.        Allergies  Eyes: Negative for redness and itching.  Respiratory: Positive for shortness of breath. Negative for cough, chest tightness and wheezing.   Cardiovascular: Negative for palpitations and leg swelling.  Gastrointestinal: Negative for nausea and  vomiting.  Genitourinary: Negative for dysuria.  Musculoskeletal: Negative for joint swelling.  Skin: Negative for rash.  Neurological: Negative for headaches.  Hematological: Does not bruise/bleed easily.  Psychiatric/Behavioral: Negative for dysphoric mood. The patient is not nervous/anxious.     Current Outpatient Prescriptions on File Prior to Visit  Medication Sig  . atorvastatin (LIPITOR) 80 MG tablet Take 1 tablet (80 mg total) by mouth daily.  . cholecalciferol (VITAMIN D) 1000 units tablet Take 1,000 Units by mouth daily.  . fenofibrate 54 MG tablet Take 54 mg by mouth daily.  . Glucosamine-Chondroit-Vit C-Mn (GLUCOSAMINE CHONDR 1500 COMPLX PO) Take 2 tablets by mouth daily.   . Insulin Glargine (TOUJEO SOLOSTAR) 300 UNIT/ML SOPN Inject 30 Units into the skin at bedtime.  [name] loratadine (CLARITIN) 10 MG tablet Take 1 tablet (10 mg total) by mouth daily.  . metoprolol succinate (TOPROL-XL) 50 MG 24 hr tablet Take 1 tablet (50 mg total) by mouth daily. Take with or immediately following a meal.  . OXYGEN Inhale 2 L/min into the lungs continuous.  . pantoprazole (PROTONIX) 40 MG tablet Take 1 tablet (40 mg total) by mouth daily at 6 (six) AM. (Patient taking differently: Take 40 mg by mouth 2 (two) times daily. )  . traZODone (DESYREL) 100 MG tablet Take 100 mg by mouth at bedtime.   . vitamin C (ASCORBIC ACID) 500 MG tablet Take 500 mg by mouth daily.  [name] aspirin EC 81 MG tablet Take 81 mg by mouth daily.   No current facility-administered medications on file prior to visit.     Chief Complaint  Patient presents with  . PULMONARY CONSULT    Referred by Dr Johnsie Cancel for COPD. Recent cxr  04/2016.  Pulmonary tests PFT 04/16/13 >> FEV1 2.08 (80%), FEV1% 75, TLC 5.54 (87%), DLCO 63%, +BD ABG 11/04/15 >> pH 7.38, PCO2 52.9, PO2 65.5 CT chest 02/22/16 >> b/l calcified pleural plaques, mild centrilobular emphysema V/Q scan 05/09/16 >> low probability for PE  Cardiology tests Echo  05/07/16 >> EF 55 to 60%, mild AS, severe LA dilation, PAS 55 mmHg  Past medical history He  has a past medical history of Allergy; Back pain; Chronic diastolic CHF (congestive heart failure) ([place]) (12/21/2015); Chronic respiratory failure ([facility]); CKD (chronic kidney disease) stage 3, GFR 30-59 ml/min (03/04/2014); Coronary artery disease; Diabetes mellitus; Difficult intubation; Esophageal adenocarcinoma ([place]); GERD (gastroesophageal reflux disease); GI bleed (01/2016); History of nuclear stress test; Hyperlipemia; Hypertension; Methicillin resistant Staphylococcus epidermidis infection (11/2015); Neuropathy; On home oxygen therapy; Persistent atrial fibrillation ([place]) (10/25/2015); Sinus bradycardia; and Stroke ([place]).  Vital signs BP 128/72 (BP Location: Left Arm, Cuff Size: Normal)   Pulse (!) 112   Ht 5\' 7"  (1.702 m)   Wt 193 lb (87.5 kg)   SpO2 100%   BMI 30.23 kg/m   History of present illness Mark Rivers is a 82 y.o. male former smoker with dyspnea.  He has been followed by cardiology.  He has trouble with his breathing.  He was started on supplemental oxygen in September.  He was seen previously by Dr. Gwenette Greet for mild COPD.  He has not been on inhaler therapy.  He is from [place], and has lived in [place] for 18 years.  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  He denies history of pneumonia or exposure to tuberculosis.  He quit smoking several years ago.  He was never in the [employer].  He has pet ducks.  He gets winded after walking about 2 blocks.  He is not having much cough, or sputum.  He denies chest pain or wheezing.  He gets seasonal allergies, but denies history of asthma.  There is no history of thromboembolic disease.  He was hospitalized in February 2018 for influenza.  Ambulatory oximetry today on room air >> lowest SpO2 91%.  Physical exam  General - No distress ENT - No sinus tenderness, no oral exudate, no  LAN, no thyromegaly, TM clear, pupils equal/reactive Cardiac - s1s2 regular, no murmur, pulses symmetric Chest - No wheeze/rales/dullness, good air entry, normal respiratory excursion Back - No focal tenderness Abd - Soft, non-tender, no organomegaly, + bowel sounds Ext - No edema Neuro - Normal strength, cranial nerves intact Skin - No rashes Psych - Normal mood, and behavior   CMP Latest Ref Rng & Units 05/10/2016 05/09/2016 05/08/2016  Glucose 65 - 99 mg/dL 99 82 80  BUN 6 - 20 mg/dL 33(H) 31(H) 30(H)  Creatinine 0.61 - 1.24 mg/dL 1.69(H) 1.60(H) 1.53(H)  Sodium 135 - 145 mmol/L 138 139 141  Potassium 3.5 - 5.1 mmol/L 3.8 3.5 3.4(L)  Chloride 101 - 111 mmol/L 98(L) 100(L) 104  CO2 22 - 32 mmol/L 33(H) 31 29  Calcium 8.9 - 10.3 mg/dL 9.3 9.1 9.0  Total Protein 6.5 - 8.1 g/dL - - -  Total Bilirubin 0.3 - 1.2 mg/dL - - -  Alkaline Phos 38 - 126 U/L - - -  AST 15 - 41 U/L - - -  ALT 17 - 63 U/L - - -     CBC Latest Ref Rng & Units 05/09/2016 05/07/2016 05/07/2016  WBC 4.0 - 10.5 K/uL 12.2(H) 11.7(H) 9.5  Hemoglobin 13.0 - 17.0 g/dL 9.0(L) 9.2(L) 9.3(L)  Hematocrit 39.0 - 52.0 % 30.1(L) 30.3(L) 29.9(L)  Platelets 150 - 400 K/uL 296 284 312     ABG    Component Value Date/Time   PHART 7.381 11/04/2015 0330   PCO2ART 52.9 (H) 11/04/2015 0330   PO2ART 65.5 (L) 11/04/2015 0330   HCO3 30.7 (H) 11/04/2015 0330   TCO2 31 11/05/2015 1803   ACIDBASEDEF 1.0 10/29/2015 1531   O2SAT 91.3 11/04/2015 0330     Discussion 82 yo male former smoker with dyspnea on exertion.  This is likely multifactorial.  He has COPD with emphysema, asbestos related pleural plaques, diastolic CHF, CAD s/p CABG, atrial fibrillation, WHO group 2 and 3 pulmonary hypertension, and deconditioning.   Assessment/plan  COPD with emphysema. - will repeat PFT and then determine if he should try inhaler therapy  Hypoxia. - room air SpO2 was acceptable today - will arrange for overnight oximetry on room air to  further assess - he had previous ABG that showed hypercapnia - depending on oxygen pattern from ONO, he might need in sleep study to assess for sleep disordered breathing  Asbestos related pleural plaques. - unlikely these are of clinical significance - monitor clinically for development of asbestos related interstitial lung disease  Chronic diastolic CHF, CAD, Atrial fibrillation. - f/u with cardiology  WHO group 2 and 3 pulmonary hypertension. - optimize therapy for secondary causes of this  Deconditioning. - he might benefit from a monitored exercise program   Patient Instructions  Will arrange for pulmonary function test and overnight oxygen test  Follow up in 4 weeks with Dr. Halford Chessman or Nurse Practitioner    Chesley Mires, MD [place] Pager:  [phone] 05/17/2016, 11:19 AM

## 2016-05-17 NOTE — Progress Notes (Signed)
   Subjective:    Patient ID: Mark Rivers, male    DOB: 02/08/1934, 82 y.o.   MRN: [id number]  HPI    Review of Systems  Constitutional: Negative for fever and unexpected weight change.  HENT: Positive for rhinorrhea and sneezing. Negative for congestion, dental problem, ear pain, nosebleeds, postnasal drip, sinus pressure, sore throat and trouble swallowing.        Allergies  Eyes: Negative for redness and itching.  Respiratory: Positive for shortness of breath. Negative for cough, chest tightness and wheezing.   Cardiovascular: Negative for palpitations and leg swelling.  Gastrointestinal: Negative for nausea and vomiting.  Genitourinary: Negative for dysuria.  Musculoskeletal: Negative for joint swelling.  Skin: Negative for rash.  Neurological: Negative for headaches.  Hematological: Does not bruise/bleed easily.  Psychiatric/Behavioral: Negative for dysphoric mood. The patient is not nervous/anxious.        Objective:   Physical Exam        Assessment & Plan:

## 2016-05-17 NOTE — Patient Instructions (Signed)
Will arrange for pulmonary function test and overnight oxygen test   Follow up in 4 weeks with Dr. Anik Wesch or Nurse Practitioner 

## 2016-05-25 ENCOUNTER — Encounter: Payer: Self-pay | Admitting: Pulmonary Disease

## 2016-06-08 ENCOUNTER — Ambulatory Visit (INDEPENDENT_AMBULATORY_CARE_PROVIDER_SITE_OTHER): Payer: Medicare Other | Admitting: Cardiovascular Disease

## 2016-06-08 ENCOUNTER — Encounter: Payer: Self-pay | Admitting: Cardiovascular Disease

## 2016-06-08 VITALS — BP 112/60 | HR 72 | Ht 67.0 in | Wt 190.8 lb

## 2016-06-08 DIAGNOSIS — I2510 Atherosclerotic heart disease of native coronary artery without angina pectoris: Secondary | ICD-10-CM | POA: Diagnosis not present

## 2016-06-08 DIAGNOSIS — I5032 Chronic diastolic (congestive) heart failure: Secondary | ICD-10-CM

## 2016-06-08 NOTE — Progress Notes (Signed)
Cardiology Office Note:    Date:  06/08/2016   ID:  Mark Rivers, DOB 08/18/1934, MRN [id number]  PCP:  [name], MD  Cardiologist:Dr. Jenkins Rouge Electrophysiologist: n/a Nephrology: Dr. Olivia Mackie Phoebe Perch)  Referring MD: [name], MD   Chief Complaint  Patient presents with  . Chronic diastolic CHF (congestive heart failure) (HCC)    History of Present Illness:    Mark Rivers is a 82 y.o. male with a hx of CAD, persistent AFib, DM2, CKD, HTN, HL, COPD.  He underwent remote angioplasty in 1988.  He was admitted in 10/17 with NSTEMI in the setting of AF with RVR.  He had critical LM stenosis on LHC and underwent emergent CABG (L-LAD, S-OM2, S-PDA).  He had a complex post op course notable for blood loss anemia requiring transfusion with PRBCs, difficulty in weaning from the ventilator resulting in tracheostomy, chest tube placement for R pleural effusion, MRSE bacteremia and AKI which limited diuresis.  He was placed on Coumadin for recurrent AF.  He was [facility].    He was admitted 03/8935 with a/c diastolic CHF.  Last seen here in follow up 01/17/16. Placed on amiodarone with plans for [facility] in a few weeks  He was admitted again 1/31-2/4 with an upper GI bleed c/b AKI, influenza.  He required transfusion with PRBCs x 1.  His diuretics were held due to hypotension and AKI.  These were not resumed at DC.  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  Coumadin was held but resumed at DC.  He was taken off of ASA.  Of note, he had Digoxin added to his medical regimen at some point (prior to admission).    He returns for follow up.  The patient denies chest pain, He denies orthopnea, PND, edema.  He denies syncope. He has been off coumadin until esophageal issues  resolved Still wearing oxygen needs pulmonary f/u   Had EGD 03/09/16 Dr [name] and had adneocarcinoma Since he is a poor surgical candidate referred to [facility] for possible endoscopic Rx   Brother has moved from [place] to be with him since [name] has died and he has been very  Helpful   Prior CV studies:   The following studies were reviewed today:  Echo 01/11/16 Mod LVH, EF 55-60, inf AK, suspect mild aortic stenosis, mod MAC, mod LAE, mild reduced RVSF, mild RAE, PASP 47  Limited echo 11/09/15 EF 50-55, normal wall motion, ventricular septal dyssynergy, moderate MAC, mild MR, possible fibrinous linear projection originating from mitral annular calcification within LA  Complete echo 11/02/15 Moderate LVH, EF 55-60, normal wall motion, grade 2 diastolic dysfunction, mild aortic stenosis (mean 14, peak 34), MAC, mild LAE  LHC 10/26/15 LM distal 99 thrombotic LAD okay LCx okay RCA proximal 80, mid 30 EF 40-45  Myoview 10/16 EF 51, no ischemia or scar; Low Risk  Echo 6/14 Mild to moderate LVH, probable mild to moderately reduced LVEF, moderate LAE, MAC, mild MR, mild TR  Myoview 6/14 No ischemia, EF 49  Past Medical History:  Diagnosis Date  . Allergy   . Back pain   . Chronic diastolic CHF (congestive heart failure) ([place]) 12/21/2015   A. Echo 10/17: Moderate LVH, EF 55-60, normal wall motion, grade 2 diastolic dysfunction, mild aortic stenosis (mean 14, peak  34), MAC, mild LAE    . Chronic respiratory failure ([place])   . CKD (chronic kidney disease) stage 3, GFR 30-59 ml/min 03/04/2014  . Coronary artery disease    a. s/p remote POBA in 1988 // b. NSTEMI in 10/17 >> LHC with 99% LM stenosis >> emergent CABG (L-LAD, S-OM2, S-PDA) - post op course complicated (prolonged ventilation, s/p Trach, AFib, anemia req transfusion, bacteremia >> DC to [facility])  . Diabetes mellitus   . Difficult intubation    difficult airway/FYI note 11/01/2015  . Esophageal adenocarcinoma ([place])      a. found by EGD 03/2016..  . GERD (gastroesophageal reflux disease)   . GI bleed 01/2016   a. 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 F/u EGD with esophageal adenocarcinoma 03/2016.  [name] History of nuclear stress test    a. Myoview 6/14 - EF 51, no ischemia or scar; Low Risk  . Hyperlipemia   . Hypertension   . Methicillin resistant Staphylococcus epidermidis infection 11/2015   a. during adm for CABG.  . Neuropathy   . On home oxygen therapy    "2L; 24/7" (02/09/2016)  . Persistent atrial fibrillation ([place]) 10/25/2015  . Sinus bradycardia    a. Prior HR 54 when in sinus rhythm 11/2015.  . Stroke [facility])    a. old L pontine infarcts seen on CT 04/2016.    Past Surgical History:  Procedure Laterality Date  . CARDIAC CATHETERIZATION N/A 10/26/2015   Procedure: Left Heart Cath and Coronary Angiography;  Surgeon: Troy Sine, MD;  Location: [place] [facility];  Service: Cardiovascular;  Laterality: N/A;  . CATARACT EXTRACTION W/ INTRAOCULAR LENS  IMPLANT, BILATERAL  2008  . COLONOSCOPY    . CORONARY ANGIOPLASTY WITH STENT PLACEMENT  1988  . CORONARY ARTERY BYPASS GRAFT N/A 10/26/2015   Procedure: CORONARY ARTERY BYPASS GRAFTING (CABG) x3(LIMA to LA, SVG to OM1, SVG to PDA) with EVH from the left thigh and partial lower leg greater saphenous vein and left internal mammary artery;  Surgeon: Ivin Poot, MD;  Location: [place];  Service: Open Heart Surgery;  Laterality: N/A;  . ESOPHAGOGASTRODUODENOSCOPY (EGD) WITH PROPOFOL N/A 02/11/2016   Procedure: ESOPHAGOGASTRODUODENOSCOPY (EGD) WITH PROPOFOL;  Surgeon: Ladene Artist, MD;  Location: [facility] ENDOSCOPY;  Service: Endoscopy;  Laterality: N/A;  . EUS N/A 03/09/2016   Procedure: UPPER ENDOSCOPIC ULTRASOUND (EUS) RADIAL;  Surgeon: Milus Banister, MD;  Location: [facility] ENDOSCOPY;   Service: Endoscopy;  Laterality: N/A;  . TEE WITHOUT CARDIOVERSION N/A 10/26/2015   Procedure: TRANSESOPHAGEAL ECHOCARDIOGRAM (TEE);  Surgeon: Ivin Poot, MD;  Location: [place];  Service: Open Heart Surgery;  Laterality: N/A;  . TONSILLECTOMY      Current Medications: Current Meds  Medication Sig  . aspirin EC 81 MG tablet Take 81 mg by mouth daily.  [name] atorvastatin (LIPITOR) 80 MG tablet Take 1 tablet (80 mg total) by mouth daily.  . cholecalciferol (VITAMIN D) 1000 units tablet Take 1,000 Units by mouth daily.  . diphenhydramine-acetaminophen (TYLENOL PM) 25-500 MG TABS tablet Take 2 tablets by mouth at bedtime.  . fenofibrate 54 MG tablet Take 54 mg by mouth daily.  . furosemide (LASIX) 40 MG tablet Take 40 mg by mouth daily.  . Glucosamine-Chondroit-Vit C-Mn (GLUCOSAMINE CHONDR 1500 COMPLX PO) Take 2 tablets by mouth daily.   . Insulin Glargine (TOUJEO SOLOSTAR)  300 UNIT/ML SOPN Inject 30 Units into the skin at bedtime.  [name] loratadine (CLARITIN) 10 MG tablet Take 1 tablet (10 mg total) by mouth daily.  . OXYGEN Inhale 2 L/min into the lungs continuous.  . pantoprazole (PROTONIX) 40 MG tablet Take 1 tablet (40 mg total) by mouth daily at 6 (six) AM.  . potassium chloride SA (K-DUR,KLOR-CON) 20 MEQ tablet Take 40 mEq by mouth daily.  . traZODone (DESYREL) 100 MG tablet Take 100 mg by mouth at bedtime.   . vitamin C (ASCORBIC ACID) 500 MG tablet Take 500 mg by mouth daily.     Allergies:   Amoxicillin; Cephalexin; and Penicillin g   Social History   Social History  . Marital status: Widowed    Spouse name: N/A  . Number of children: N/A  . Years of education: N/A   Occupational History  . retired    Social History Main Topics  . Smoking status: Former Smoker    Packs/day: 2.00    Years: 30.00    Types: Cigarettes    Quit date: 06/28/2004  . Smokeless tobacco: Never Used  . Alcohol use No     Comment: QUIT 06/28/2004  . Drug use: No  . Sexual activity: Yes   Other  Topics Concern  . None   Social History Narrative  . None     Family History  Problem Relation Age of Onset  . Diabetes Father   . Colon cancer Neg Hx   . Esophageal cancer Neg Hx   . Stomach cancer Neg Hx   . Rectal cancer Neg Hx   . Allergic rhinitis Neg Hx   . Angioedema Neg Hx   . Asthma Neg Hx   . Eczema Neg Hx   . Immunodeficiency Neg Hx   . Urticaria Neg Hx   . Liver disease Neg Hx      ROS:   Please see the history of present illness.    Review of Systems  HENT: Positive for hearing loss.   Cardiovascular: Positive for irregular heartbeat.  Respiratory: Positive for cough and wheezing.    All other systems reviewed and are negative.   EKGs/Labs/Other Test Reviewed:    EKG:  02/16/16  AFib, HR 66  Recent Labs: 11/27/2015: TSH 2.639 01/12/2016: Magnesium 2.1 05/07/2016: ALT 22; B Natriuretic Peptide 1,301.2 05/09/2016: Hemoglobin 9.0; Platelets 296 05/10/2016: BUN 33; Creatinine, Ser 1.69; Potassium 3.8; Sodium 138   Recent Lipid Panel    Component Value Date/Time   TRIG 268 (H) 11/05/2015 1325     Physical Exam:    VS:  BP 112/60   Pulse 72   Ht _0  (1.702 m)   Wt 190 lb 12.8 oz (86.5 kg)   SpO2 95%   BMI 29.88 kg/m     Wt Readings from Last 3 Encounters:  06/08/16 190 lb 12.8 oz (86.5 kg)  05/17/16 193 lb (87.5 kg)  05/10/16 189 lb 12.8 oz (86.1 kg)     Physical Exam  Constitutional: He is oriented to person, place, and time. He appears well-developed and well-nourished. No distress.  HENT:  Head: Normocephalic and atraumatic.  Eyes: No scleral icterus.  Neck: No JVD present.  Cardiovascular: Normal rate, S1 normal and S2 normal.  An irregularly irregular rhythm present.  No murmur heard. Pulmonary/Chest: He has decreased breath sounds. He has rhonchi. He has no rales.  Abdominal: Soft. He exhibits no distension.  Musculoskeletal: He exhibits no edema.  Neurological: He is alert and oriented  to person, place, and time.  Skin: Skin is warm  and dry.  Psychiatric: He has a normal mood and affect.    ASSESSMENT:    1. Chronic diastolic CHF (congestive heart failure) (HCC)    PLAN:    In order of problems listed above:  1. CAD - s/p NSTEMI in the setting of AF with RVR in 10/17 followed by CABG.  He is now off of ASA due to recent GI bleed.  He is no longer on nitrates due to hypotension. He denies angina.  Continue beta-blocker, statin.    2. Persistent AF - He remains in AF.  Rate control is fine No coumadin until issues with esophageal cancer resolved   3. Chronic diastolic CHF - Volume is stable.  He is off of Lasix.  We discussed the importance of daily weights and when to take Lasix.  4. CKD - His creatinine increased in the hospital.  He is due for FU with his nephrologist.  Check BMET today.  5. HTN - BP is controlled.  Adjust medications as noted.   6. COPD - f/u pulmonary on selective beta blocker left lung exam ? ILD wearing oxygen still Will arrange f/u with Dr Halford Chessman and pulmonary CXR ordered today given abnormal exam  7. Hx of GI bleed - with invasive esophageal cancer no coumadin has f/u at [facility] biopsy did show Adenocarcinoma Not clear to me what next Rx step is Told him to have Dr Fuller Plan discuss with [facility] And come up with a plan No coumadin for now    [employer]

## 2016-06-08 NOTE — Patient Instructions (Addendum)
Medication Instructions:  Your physician recommends that you continue on your current medications as directed. Please refer to the Current Medication list given to you today.  Labwork: NONE  Testing/Procedures: NONE  Follow-Up: Your physician wants you to follow-up in: 3 months with Dr. Nishan.   If you need a refill on your cardiac medications before your next appointment, please call your pharmacy.    

## 2016-06-14 ENCOUNTER — Other Ambulatory Visit: Payer: Self-pay | Admitting: Cardiovascular Disease

## 2016-06-14 ENCOUNTER — Telehealth: Payer: Self-pay | Admitting: Pulmonary Disease

## 2016-06-14 DIAGNOSIS — G4733 Obstructive sleep apnea (adult) (pediatric): Secondary | ICD-10-CM

## 2016-06-14 MED ORDER — POTASSIUM CHLORIDE CRYS ER 20 MEQ PO TBCR: 40.0000 meq | EXTENDED_RELEASE_TABLET | Freq: Every day | ORAL | 11 refills | Status: DC

## 2016-06-14 MED ORDER — FUROSEMIDE 40 MG PO TABS: 40.0000 mg | ORAL_TABLET | Freq: Every day | ORAL | 11 refills | Status: DC

## 2016-06-14 NOTE — Telephone Encounter (Signed)
Pt's medication was sent to pt's pharmacy as requested. Confirmation received.  °

## 2016-06-14 NOTE — Telephone Encounter (Signed)
ONO with RA 05/25/16 >> test time 6 hrs 45 min.  Average SpO2 92%, low SpO2 71%.  Spent 32 min with SpO2 < 88%.   Will have my nurse inform pt that ONO showed low oxygen at night, and pattern was suggestive that he might have sleep apnea.  If he is agreeable, then please schedule him for an in lab split night sleep study.

## 2016-06-15 NOTE — Telephone Encounter (Signed)
Results have been explained to patient, pt expressed understanding. Order placed for split night. Nothing further needed.

## 2016-06-21 ENCOUNTER — Encounter: Payer: Self-pay | Admitting: Acute Care

## 2016-06-21 ENCOUNTER — Ambulatory Visit (INDEPENDENT_AMBULATORY_CARE_PROVIDER_SITE_OTHER): Payer: Medicare Other | Admitting: Acute Care

## 2016-06-21 ENCOUNTER — Ambulatory Visit (INDEPENDENT_AMBULATORY_CARE_PROVIDER_SITE_OTHER): Payer: Medicare Other | Admitting: Pulmonary Disease

## 2016-06-21 DIAGNOSIS — J432 Centrilobular emphysema: Secondary | ICD-10-CM

## 2016-06-21 DIAGNOSIS — R5381 Other malaise: Secondary | ICD-10-CM | POA: Insufficient documentation

## 2016-06-21 DIAGNOSIS — I2510 Atherosclerotic heart disease of native coronary artery without angina pectoris: Secondary | ICD-10-CM

## 2016-06-21 DIAGNOSIS — I4819 Other persistent atrial fibrillation: Secondary | ICD-10-CM

## 2016-06-21 DIAGNOSIS — R0902 Hypoxemia: Secondary | ICD-10-CM | POA: Diagnosis not present

## 2016-06-21 DIAGNOSIS — I481 Persistent atrial fibrillation: Secondary | ICD-10-CM

## 2016-06-21 LAB — PULMONARY FUNCTION TEST
DL/VA % pred: 58 %
DL/VA: 2.57 ml/min/mmHg/L
DLCO COR: 13.36 ml/min/mmHg
DLCO UNC % PRED: 46 %
DLCO UNC: 12.93 ml/min/mmHg
DLCO cor % pred: 48 %
FEF 25-75 PRE: 0.98 L/s
FEF 25-75 Post: 1.69 L/sec
FEF2575-%CHANGE-POST: 72 %
FEF2575-%PRED-PRE: 59 %
FEF2575-%Pred-Post: 102 %
FEV1-%Change-Post: 14 %
FEV1-%PRED-POST: 83 %
FEV1-%PRED-PRE: 73 %
FEV1-POST: 2.03 L
FEV1-Pre: 1.77 L
FEV1FVC-%CHANGE-POST: 7 %
FEV1FVC-%Pred-Pre: 95 %
FEV6-%CHANGE-POST: 7 %
FEV6-%PRED-POST: 86 %
FEV6-%PRED-PRE: 80 %
FEV6-Post: 2.74 L
FEV6-Pre: 2.54 L
FEV6FVC-%CHANGE-POST: 0 %
FEV6FVC-%PRED-POST: 106 %
FEV6FVC-%Pred-Pre: 105 %
FVC-%Change-Post: 6 %
FVC-%Pred-Post: 81 %
FVC-%Pred-Pre: 76 %
FVC-Post: 2.79 L
FVC-Pre: 2.63 L
POST FEV6/FVC RATIO: 98 %
PRE FEV1/FVC RATIO: 67 %
Post FEV1/FVC ratio: 73 %
Pre FEV6/FVC Ratio: 97 %
RV % PRED: 137 %
RV: 3.4 L
TLC % PRED: 93 %
TLC: 5.94 L

## 2016-06-21 MED ORDER — FLUTICASONE FUROATE-VILANTEROL 100-25 MCG/INH IN AEPB: 1.0000 | INHALATION_SPRAY | Freq: Every day | RESPIRATORY_TRACT | 0 refills | Status: DC

## 2016-06-21 NOTE — Progress Notes (Signed)
PFT done today. 

## 2016-06-21 NOTE — Assessment & Plan Note (Signed)
Physical deconditioning Plan We have offered patient pulmonary rehabilitation. He is deferring at present time I have encouraged him to let us know when he changes his mind and would like to participate.

## 2016-06-21 NOTE — Patient Instructions (Addendum)
It is good to see you today. Your Pulmonary Function tests indicate some worsening  Obstruction over the last 3 years We will start you on an inhaler to see if this helps your shortness of breath. We will start Breo 1 puff once daily. We will instruct you on use. We will use this for 1 month and have you return in 1 month to determine response to medication. Use this medicine every day without fail. Remember to rinse your mouth after use. Follow up in 4 weeks with Judson Roch or Dr. Halford Chessman Split Night Sleep Study as is scheduled in August. Please let us know if you decide you would like to try Pulmonary rehab. Follow up with Cardiology as you have been doing. Please contact office for sooner follow up if symptoms do not improve or worsen or seek emergency care

## 2016-06-21 NOTE — Progress Notes (Signed)
History of Present Illness Mark Rivers is a 82 y.o. male former smoker ( 60 pack years, quit 2006) with COPD, coronary artery disease, persistent atrial fibrillation.[name] He is followed by Dr. Halford Chessman.  Of note patient had recent hospitalization January 2018 with acute on chronic diastolic heart failure, and GI bleed. He was placed on amiodarone upon discharge and is currently unable to tolerate  anticoagulation due to GI bleed therefore  plan for [facility] is on hold into issues with esophageal cancer have been resolved..  Additionally patient had EGD 03/09/16 Dr [name] and had adneocarcinoma Since he is a poor surgical candidate referred to [facility] for possible endoscopic Rx   06/21/2016 Follow Up OV Pt was seen by Dr. Halford Chessman 05/17/2016 for Dyspnea and worsening COPD. Suspected this is multifactorial as patient has COPD with emphysema, asbestos-related pleural plaques, diastolic heart failure, coronary artery disease status post CABG, atrial fibrillation, and WHO grade 2 and 3 pulmonary hypertension, and deconditioning. Plan at that time was, per diagnosis:  COPD with emphysema. - will repeat PFT and then determine if he should try inhaler therapy  Hypoxia. - room air SpO2 was acceptable today - will arrange for overnight oximetry on room air to further assess - he had previous ABG that showed hypercapnia - depending on oxygen pattern from ONO, he might need in sleep study to assess for sleep disordered breathing  Asbestos related pleural plaques. - unlikely these are of clinical significance - monitor clinically for development of asbestos related interstitial lung disease  Chronic diastolic CHF, CAD, Atrial fibrillation. - f/u with cardiology  WHO group 2 and 3 pulmonary hypertension. - optimize therapy for secondary causes of this  Deconditioning. - he might benefit from a monitored exercise program   Follow Up: Pt. presents for follow-up after PFTs. States he is still having shortness  of breath with exertion. He states he gets short of breath especially when he has to go to feed his animals. He states he is not coughing up any secretions.He denies fever , chest pain, orthopnea or hemoptysis. He denies any wheezing. He did pulmonary function tests today, which indicated a worsening of his COPD when compared to previous pulmonary function tests.  Test Results: PFTs 06/21/2016 FVC 2.63, 76% predicted FEV1 1.77:73% predicted F/F ratio: 95% TLC5.94:93% predicted DLCO 12.93:46% predicted Positive bronchodilator response  Pulmonary tests Ambulatory oximetry 05/17/2016 on room air >> lowest SpO2 91%. ONO with RA 05/25/16 >> test time 6 hrs 45 min.  Average SpO2 92%, low SpO2 71%.  Spent 32 min with SpO2 < 88%. PFT 04/16/13 >> FEV1 2.08 (80%), FEV1% 75, TLC 5.54 (87%), DLCO 63%, +BD ABG 11/04/15 >> pH 7.38, PCO2 52.9, PO2 65.5 CT chest 02/22/16 >> b/l calcified pleural plaques, mild centrilobular emphysema V/Q scan 05/09/16 >> low probability for PE Ambulatory oximetry today on room air >> lowest SpO2 91%.   Cardiology tests Echo 05/07/16 >> EF 55 to 60%, mild AS, severe LA dilation, PAS 55 mmHg   CBC Latest Ref Rng & Units 05/09/2016 05/07/2016 05/07/2016  WBC 4.0 - 10.5 K/uL 12.2(H) 11.7(H) 9.5  Hemoglobin 13.0 - 17.0 g/dL 9.0(L) 9.2(L) 9.3(L)  Hematocrit 39.0 - 52.0 % 30.1(L) 30.3(L) 29.9(L)  Platelets 150 - 400 K/uL 296 284 312    BMP Latest Ref Rng & Units 05/10/2016 05/09/2016 05/08/2016  Glucose 65 - 99 mg/dL 99 82 80  BUN 6 - 20 mg/dL 33(H) 31(H) 30(H)  Creatinine 0.61 - 1.24 mg/dL 1.69(H) 1.60(H) 1.53(H)  BUN/Creat Ratio 10 -  24 - - -  Sodium 135 - 145 mmol/L 138 139 141  Potassium 3.5 - 5.1 mmol/L 3.8 3.5 3.4(L)  Chloride 101 - 111 mmol/L 98(L) 100(L) 104  CO2 22 - 32 mmol/L 33(H) 31 29  Calcium 8.9 - 10.3 mg/dL 9.3 9.1 9.0    BNP    Component Value Date/Time   BNP 1,301.2 (H) 05/07/2016 0950    ProBNP No results found for: PROBNP  PFT    Component  Value Date/Time   FEV1PRE 1.77 06/21/2016 1239   FEV1POST 2.03 06/21/2016 1239   FVCPRE 2.63 06/21/2016 1239   FVCPOST 2.79 06/21/2016 1239   TLC 5.94 06/21/2016 1239   DLCOUNC 12.93 06/21/2016 1239   PREFEV1FVCRT 67 06/21/2016 1239   PSTFEV1FVCRT 73 06/21/2016 1239    No results found.   Past medical hx Past Medical History:  Diagnosis Date  . Allergy   . Back pain   . Chronic diastolic CHF (congestive heart failure) ([place]) 12/21/2015   A. Echo 10/17: Moderate LVH, EF 55-60, normal wall motion, grade 2 diastolic dysfunction, mild aortic stenosis (mean 14, peak 34), MAC, mild LAE    . Chronic respiratory failure ([place])   . CKD (chronic kidney disease) stage 3, GFR 30-59 ml/min 03/04/2014  . Coronary artery disease    a. s/p remote POBA in 1988 // b. NSTEMI in 10/17 >> LHC with 99% LM stenosis >> emergent CABG (L-LAD, S-OM2, S-PDA) - post op course complicated (prolonged ventilation, s/p Trach, AFib, anemia req transfusion, bacteremia >> DC to LTAC)  . Diabetes mellitus   . Difficult intubation    difficult airway/FYI note 11/01/2015  . Esophageal adenocarcinoma ([place])    a. found by EGD 03/2016..  . GERD (gastroesophageal reflux disease)   . GI bleed 01/2016   a. 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 F/u EGD with esophageal adenocarcinoma 03/2016.  [name] History of nuclear stress test    a. Myoview 6/14 - EF 51, no ischemia or scar; Low Risk  . Hyperlipemia   . Hypertension   . Methicillin resistant Staphylococcus epidermidis infection 11/2015   a. during adm for CABG.  . Neuropathy   . On home oxygen therapy    "2L; 24/7" (02/09/2016)  . Persistent atrial fibrillation ([place]) 10/25/2015  . Sinus bradycardia    a. Prior HR 54 when in sinus rhythm 11/2015.  . Stroke [facility])    a. old L pontine infarcts seen on CT  04/2016.     Social History  Substance Use Topics  . Smoking status: Former Smoker    Packs/day: 2.00    Years: 30.00    Types: Cigarettes    Quit date: 06/28/2004  . Smokeless tobacco: Never Used  . Alcohol use No     Comment: QUIT 06/28/2004    Tobacco Cessation: Former smoker 60 pack years quit 2006  Past surgical hx, Family hx, Social hx all reviewed.  Current Outpatient Prescriptions on File Prior to Visit  Medication Sig  . aspirin EC 81 MG tablet Take 81 mg by mouth daily.  [name] atorvastatin (LIPITOR) 80 MG tablet Take 1 tablet (80 mg total) by mouth daily.  . cholecalciferol (VITAMIN D) 1000 units tablet Take 1,000 Units by mouth daily.  . diphenhydramine-acetaminophen (TYLENOL PM) 25-500 MG TABS tablet Take 2 tablets by mouth at bedtime.  . fenofibrate 54 MG tablet  Take 54 mg by mouth daily.  . furosemide (LASIX) 40 MG tablet Take 1 tablet (40 mg total) by mouth daily.  . Glucosamine-Chondroit-Vit C-Mn (GLUCOSAMINE CHONDR 1500 COMPLX PO) Take 2 tablets by mouth daily.   . Insulin Glargine (TOUJEO SOLOSTAR) 300 UNIT/ML SOPN Inject 30 Units into the skin at bedtime.  [name] loratadine (CLARITIN) 10 MG tablet Take 1 tablet (10 mg total) by mouth daily.  . OXYGEN Inhale 2 L/min into the lungs continuous.  . pantoprazole (PROTONIX) 40 MG tablet Take 1 tablet (40 mg total) by mouth daily at 6 (six) AM.  . potassium chloride SA (K-DUR,KLOR-CON) 20 MEQ tablet Take 2 tablets (40 mEq total) by mouth daily.  . traZODone (DESYREL) 100 MG tablet Take 100 mg by mouth at bedtime.   . vitamin C (ASCORBIC ACID) 500 MG tablet Take 500 mg by mouth daily.  . metoprolol succinate (TOPROL-XL) 50 MG 24 hr tablet Take 1 tablet (50 mg total) by mouth daily. Take with or immediately following a meal.   No current facility-administered medications on file prior to visit.      Allergies  Allergen Reactions  . Amoxicillin Other (See Comments)    PATIENT PASSED OUT   . Cephalexin Other (See Comments)      PATIENT PASSED OUT  . Penicillin G Other (See Comments)    PATIENT PASSED OUT. Has patient had a PCN reaction causing immediate rash, facial/tongue/throat swelling, SOB or lightheadedness with hypotension: Yes Has patient had a PCN reaction causing severe rash involving mucus membranes or skin necrosis: No Has patient had a PCN reaction that required hospitalization: Yes Has patient had a PCN reaction occurring within the last 10 years: Yes If all of the above answers are "NO", then may proceed with Cephalosporin    Review Of Systems:  Constitutional:   No  weight loss, night sweats,  Fevers, chills, fatigue, or  lassitude.  HEENT:   No headaches,  Difficulty swallowing,  Tooth/dental problems, or  Sore throat,                No sneezing, itching, ear ache, nasal congestion, post nasal drip,   CV:  No chest pain,  Orthopnea, PND, swelling in lower extremities, anasarca, dizziness, palpitations, syncope.   GI  No heartburn, indigestion, abdominal pain, nausea, vomiting, diarrhea, change in bowel habits, loss of appetite, bloody stools.   Resp: + shortness of breath with exertion less at rest.  No excess mucus, no productive cough,  No non-productive cough,  No coughing up of blood.  No change in color of mucus.  No wheezing.  No chest wall deformity  Skin: no rash or lesions.  GU: no dysuria, change in color of urine, no urgency or frequency.  No flank pain, no hematuria   MS:  No joint pain or swelling.  No decreased range of motion.  No back pain.  Psych:  No change in mood or affect. No depression or anxiety.  No memory loss.   Vital Signs BP 104/66 (BP Location: Left Arm, Patient Position: Sitting, Cuff Size: Normal)   Pulse 95   Ht 5' 6.5" (1.689 m)   Wt 193 lb (87.5 kg)   SpO2 97%   BMI 30.68 kg/m    Physical Exam:  General- No distress,  A&Ox3, pleasant and quiet ENT: No sinus tenderness, TM clear, pale nasal mucosa, no oral exudate,no post nasal drip, no  LAN Cardiac: S1, S2, regular rate and rhythm, no murmur Chest: No wheeze/ rales/  dullness; no accessory muscle use, no nasal flaring, no sternal retractions Abd.: Soft Non-tender Ext: No clubbing cyanosis, edema Neuro:  Alert and oriented 3, moving all extremities 4, deconditioned at baseline. Skin: No rashes, warm and dry Psych: normal mood and behavior   Assessment/Plan  Pulmonary emphysema (HCC) PFTs indicate increase in obstructive disease, with positive bronchodilator response DLCO has decreased from 63% in 2015 to 46% 2018 Patient is declining pulmonary rehabilitation at this point Plan We will start you on an inhaler to see if this helps your shortness of breath. We will start Breo 1 puff once daily. We will instruct you on use. We will use this for 1 month and have you return in 1 month to determine response to medication. Use this medicine every day without fail. Remember to rinse your mouth after use. Follow up in 4 weeks with Judson Roch or Dr. Halford Chessman to evaluate therapeutic response on Breo Please let us know if you decide you would like to try Pulmonary rehab. Please contact office for sooner follow up if symptoms do not improve or worsen or seek emergency care       Hypoxemia Oxygen saturations today on room air were 97% Overnight oximetry indicated desaturations Plan Split Night Sleep Study as is scheduled in August. Message to Dr. Irene Pap to see if he would like to start overnight oxygen prior to split night study    Persistent atrial fibrillation [facility]) Seen by Dr. Cecile Sheerer 06/08/2016 Treatment of atrial fibrillation and diastolic heart failure Per cardiology  Physical deconditioning Physical deconditioning Plan We have offered patient pulmonary rehabilitation. He is deferring at present time I have encouraged him to let us know when he changes his mind and would like to participate.  Asbestos-related pleural plaques Continue to monitor clinically for  development of asbestos related interstitial lung disease   Magdalen Spatz, NP 06/21/2016  9:05 PM

## 2016-06-21 NOTE — Assessment & Plan Note (Signed)
Seen by Dr. Cecile Sheerer 06/08/2016 Treatment of atrial fibrillation and diastolic heart failure Per cardiology

## 2016-06-21 NOTE — Assessment & Plan Note (Addendum)
Oxygen saturations today on room air were 97% Overnight oximetry indicated desaturations Plan Split Night Sleep Study as is scheduled in August. Message to Dr. Irene Pap to see if he would like to start overnight oxygen prior to split night study

## 2016-06-21 NOTE — Progress Notes (Signed)
Patient seen in the office today and instructed on use of Breo 100.  Patient expressed understanding and demonstrated technique.TA/CMA 

## 2016-06-21 NOTE — Assessment & Plan Note (Addendum)
PFTs indicate increase in obstructive disease, with positive bronchodilator response DLCO has decreased from 63% in 2015 to 46% 2018 Patient is declining pulmonary rehabilitation at this point Plan We will start you on an inhaler to see if this helps your shortness of breath. We will start Breo 1 puff once daily. We will instruct you on use. We will use this for 1 month and have you return in 1 month to determine response to medication. Use this medicine every day without fail. Remember to rinse your mouth after use. Follow up in 4 weeks with Judson Roch or Dr. Halford Chessman to evaluate therapeutic response on Breo Please let us know if you decide you would like to try Pulmonary rehab. Please contact office for sooner follow up if symptoms do not improve or worsen or seek emergency care

## 2016-06-22 NOTE — Progress Notes (Signed)
I have reviewed and agree with assessment/plan.  Chesley Mires, MD [facility] Pulmonary/Critical Care 06/22/2016, 1:05 AM Pager:  [phone]

## 2016-07-03 ENCOUNTER — Ambulatory Visit: Payer: PRIVATE HEALTH INSURANCE | Admitting: Cardiovascular Disease

## 2016-07-19 ENCOUNTER — Ambulatory Visit (INDEPENDENT_AMBULATORY_CARE_PROVIDER_SITE_OTHER): Payer: Medicare Other | Admitting: Acute Care

## 2016-07-19 ENCOUNTER — Encounter: Payer: Self-pay | Admitting: Acute Care

## 2016-07-19 VITALS — BP 124/68 | HR 117 | Ht 66.5 in | Wt 195.4 lb

## 2016-07-19 DIAGNOSIS — I2510 Atherosclerotic heart disease of native coronary artery without angina pectoris: Secondary | ICD-10-CM

## 2016-07-19 DIAGNOSIS — R0609 Other forms of dyspnea: Secondary | ICD-10-CM

## 2016-07-19 DIAGNOSIS — J9621 Acute and chronic respiratory failure with hypoxia: Secondary | ICD-10-CM

## 2016-07-19 MED ORDER — FLUTICASONE FUROATE-VILANTEROL 100-25 MCG/INH IN AEPB: 1.0000 | INHALATION_SPRAY | Freq: Every day | RESPIRATORY_TRACT | 3 refills | Status: DC

## 2016-07-19 NOTE — Patient Instructions (Addendum)
It is nice to see you today. We will send in a prescription for Breo. Take 1 puff once daily as you have been doing. Rinse mouth after use. Follow up with Dr. Halford Chessman in 6 months. Please contact office for sooner follow up if symptoms do not improve or worsen or seek emergency care

## 2016-07-19 NOTE — Assessment & Plan Note (Signed)
Significant improvement with dyspnea on exertion after starting on Breo puff once daily Plan We will send in a prescription for Breo. Take 1 puff once daily as you have been doing. Rinse mouth after use. Follow up with Dr. Halford Chessman in 6 months. Please contact office for sooner follow up if symptoms do not improve or worsen or seek emergency care

## 2016-07-19 NOTE — Progress Notes (Signed)
History of Present Illness Mark Rivers is a 82 y.o. male former smoker ( 60 pack years, quit 2006) with COPD, coronary artery disease, persistent atrial fibrillation.Mark Rivers He is followed by Mark. Halford Chessman.   Of note patient had recent hospitalization January 2018 with acute on chronic diastolic heart failure, and GI bleed. He was placed on amiodarone upon discharge and is currently unable to tolerate  anticoagulation due to GI bleed therefore  plan for Mark Rivers is on hold into issues with esophageal cancer have been resolved..  Additionally patient had EGD 03/09/16 Mark Rivers and had adneocarcinoma Since he is a poor surgical candidate referred to Mark Rivers for possible endoscopic Rx   07/19/2016 Follow Up Emphysema.  Pt. Is here for follow up of office visit 06/21/2016 for worsening dyspnea on exertion. Pulmonary function tests done June 2018 indicated progression of disease with significant bronchodilator response. He was started on Breo as a therapeutic trial for dyspnea and worsening COPD. ( Suspect multifactorial as patient has COPD with emphysema, asbestos-related pleural plaques, diastolic heart failure, coronary artery disease status post CABG, atrial fibrillation, and WHO grade 2 and 3 pulmonary hypertension, and deconditioning.) He states that he is much better on Mark Mark Rivers inhaler. He is thrilled that his shortness of breath is significanty improved. He is able to be more active. Actually pressure washed Mark driveway today with no problem.He wants to have a prescription for Mark [facility] Rivers.He is going to Mark Rivers for a GI procedure for an esophageal polyp this coming week. He denies cough or sputum production. He denies chest pain, fever, orthopnea or hemoptysis. We did walk Mark patient today in Mark office. He did not have any desaturations on room air. He does not Mark qualifications for oxygen.  Test Results:  PFTs 06/21/2016 FVC 2.63, 76% predicted FEV1 1.77:73% predicted F/F ratio: 95% TLC5.94:93%  predicted DLCO 12.93:46% predicted Positive bronchodilator response  Mild obstruction on spirometry. Positive bronchodilator response. Air trapping on lung volume. Moderate diffusion defect. There has been a decrease in diffusion capacity compared to 2015.    Pulmonary tests Ambulatory oximetry 05/17/2016 on room air >> lowest SpO2 91%. ONO with RA 05/25/16 >>test time 6 hrs 45 min. Average SpO2 92%, low SpO2 71%. Spent 32 min with SpO2 <88%. PFT 04/16/13 >> FEV1 2.08 (80%), FEV1% 75, TLC 5.54 (87%), DLCO 63%, +BD ABG 11/04/15 >> pH 7.38, PCO2 52.9, PO2 65.5 CT chest 02/22/16 >> b/l calcified pleural plaques, mild centrilobular emphysema V/Q scan 05/09/16 >> low probability for PE Ambulatory oximetry today on room air >> lowest SpO2 91%.   Cardiology tests Echo 05/07/16 >> EF 55 to 60%, mild AS, severe LA dilation, PAS 55 mmHg    CBC Latest Ref Rng & Units 05/09/2016 05/07/2016 05/07/2016  WBC 4.0 - 10.5 K/uL 12.2(H) 11.7(H) 9.5  Hemoglobin 13.0 - 17.0 g/dL 9.0(L) 9.2(L) 9.3(L)  Hematocrit 39.0 - 52.0 % 30.1(L) 30.3(L) 29.9(L)  Platelets 150 - 400 K/uL 296 284 312    BMP Latest Ref Rng & Units 05/10/2016 05/09/2016 05/08/2016  Glucose 65 - 99 mg/dL 99 82 80  BUN 6 - 20 mg/dL 33(H) 31(H) 30(H)  Creatinine 0.61 - 1.24 mg/dL 1.69(H) 1.60(H) 1.53(H)  BUN/Creat Ratio 10 - 24 - - -  Sodium 135 - 145 mmol/L 138 139 141  Potassium 3.5 - 5.1 mmol/L 3.8 3.5 3.4(L)  Chloride 101 - 111 mmol/L 98(L) 100(L) 104  CO2 22 - 32 mmol/L 33(H) 31 29  Calcium 8.9 - 10.3 mg/dL 9.3 9.1 9.0  BNP    Component Value Date/Time   BNP 1,301.2 (H) 05/07/2016 0950    ProBNP No results found for: PROBNP  PFT    Component Value Date/Time   FEV1PRE 1.77 06/21/2016 1239   FEV1POST 2.03 06/21/2016 1239   FVCPRE 2.63 06/21/2016 1239   FVCPOST 2.79 06/21/2016 1239   TLC 5.94 06/21/2016 1239   DLCOUNC 12.93 06/21/2016 1239   PREFEV1FVCRT 67 06/21/2016 1239   PSTFEV1FVCRT 73 06/21/2016 1239     No results found.   Past medical hx Past Medical History:  Diagnosis Date  . Allergy   . Back pain   . Chronic diastolic CHF (congestive heart failure) (Mark Rivers) 12/21/2015   A. Echo 10/17: Moderate LVH, EF 55-60, normal wall motion, grade 2 diastolic dysfunction, mild aortic stenosis (mean 14, peak 34), MAC, mild LAE    . Chronic respiratory failure (Mark Rivers)   . CKD (chronic kidney disease) stage 3, GFR 30-59 ml/min 03/04/2014  . Coronary artery disease    a. s/p remote POBA in 1988 // b. NSTEMI in 10/17 >> LHC with 99% LM stenosis >> emergent CABG (L-LAD, S-OM2, S-PDA) - post op course complicated (prolonged ventilation, s/p Trach, AFib, anemia req transfusion, bacteremia >> DC to [facility])  . Diabetes mellitus   . Difficult intubation    difficult airway/FYI note 11/01/2015  . Esophageal adenocarcinoma (Mark Rivers)    a. found by EGD 03/2016..  . GERD (gastroesophageal reflux disease)   . GI bleed 01/2016   a. Adm 01/2016: EGD demonstrated a single bleeding angiodysplastic lesion in Mark stomach which was tx with argonplasma coagulation; single mucosal papule found in Mark stomach that was biopsied and erosive duodenitis. F/u EGD with esophageal adenocarcinoma 03/2016.  Mark Rivers History of nuclear stress test    a. Myoview 6/14 - EF 51, no ischemia or scar; Low Risk  . Hyperlipemia   . Hypertension   . Methicillin resistant Staphylococcus epidermidis infection 11/2015   a. during adm for CABG.  . Neuropathy   . On home oxygen therapy    "2L; 24/7" (02/09/2016)  . Persistent atrial fibrillation (Mark Rivers) 10/25/2015  . Sinus bradycardia    a. Prior HR 54 when in sinus rhythm 11/2015.  . Stroke Mark Rivers)    a. old L pontine infarcts seen on CT 04/2016.     Social History  Substance Use Topics  . Smoking status: Former Smoker    Packs/day: 2.00    Years: 30.00    Types: Cigarettes    Quit date: 06/28/2004  . Smokeless tobacco: Never Used  . Alcohol use No     Comment: QUIT 06/28/2004    Tobacco  Cessation: Former smoker with a 60 pack year smoking history quit 06/28/2004  Past surgical hx, Family hx, Social hx all reviewed.  Current Outpatient Prescriptions on File Prior to Visit  Medication Sig  . aspirin EC 81 MG tablet Take 81 mg by mouth daily.  Mark Rivers atorvastatin (LIPITOR) 80 MG tablet Take 1 tablet (80 mg total) by mouth daily.  . cholecalciferol (VITAMIN D) 1000 units tablet Take 1,000 Units by mouth daily.  . diphenhydramine-acetaminophen (TYLENOL PM) 25-500 MG TABS tablet Take 2 tablets by mouth at bedtime.  . fenofibrate 54 MG tablet Take 54 mg by mouth daily.  . furosemide (LASIX) 40 MG tablet Take 1 tablet (40 mg total) by mouth daily.  . Glucosamine-Chondroit-Vit C-Mn (GLUCOSAMINE CHONDR 1500 COMPLX PO) Take 2 tablets by mouth daily.   . Insulin Glargine (TOUJEO SOLOSTAR) 300 UNIT/ML SOPN Inject  30 Units into Mark skin at bedtime.  Mark Rivers loratadine (CLARITIN) 10 MG tablet Take 1 tablet (10 mg total) by mouth daily.  . metoprolol succinate (TOPROL-XL) 50 MG 24 hr tablet Take 1 tablet (50 mg total) by mouth daily. Take with or immediately following a meal.  . OXYGEN Inhale 2 L/min into Mark lungs continuous.  . pantoprazole (PROTONIX) 40 MG tablet Take 1 tablet (40 mg total) by mouth daily at 6 (six) AM.  . potassium chloride SA (K-DUR,KLOR-CON) 20 MEQ tablet Take 2 tablets (40 mEq total) by mouth daily.  . traZODone (DESYREL) 100 MG tablet Take 100 mg by mouth at bedtime.   . vitamin C (ASCORBIC ACID) 500 MG tablet Take 500 mg by mouth daily.   No current facility-administered medications on file prior to visit.      Allergies  Allergen Reactions  . Amoxicillin Other (See Comments)    PATIENT PASSED OUT   . Cephalexin Other (See Comments)    PATIENT PASSED OUT  . Penicillin G Other (See Comments)    PATIENT PASSED OUT. Has patient had a PCN reaction causing immediate rash, facial/tongue/throat swelling, SOB or lightheadedness with hypotension: Yes Has patient had a PCN  reaction causing severe rash involving mucus membranes or skin necrosis: No Has patient had a PCN reaction that required hospitalization: Yes Has patient had a PCN reaction occurring within Mark last 10 years: Yes If all of Mark above answers are "NO", then may proceed with Cephalosporin    Review Of Systems:  Constitutional:   No  weight loss, night sweats,  Fevers, chills, fatigue, or  lassitude.  HEENT:   No headaches,  Difficulty swallowing,  Tooth/dental problems, or  Sore throat,                No sneezing, itching, ear ache, nasal congestion, post nasal drip,   CV:  No chest pain,  Orthopnea, PND, swelling in lower extremities, anasarca, dizziness, palpitations, syncope.   GI  No heartburn, indigestion, abdominal pain, nausea, vomiting, diarrhea, change in bowel habits, loss of appetite, bloody stools.   Resp: + shortness of breath with exertion or at rest.  No excess mucus, no productive cough,  No non-productive cough,  No coughing up of blood.  No change in color of mucus.  No wheezing.  No chest wall deformity  Skin: no rash or lesions.  GU: no dysuria, change in color of urine, no urgency or frequency.  No flank pain, no hematuria   MS:  No joint pain or swelling.  No decreased range of motion.  No back pain.  Psych:  No change in mood or affect. No depression or anxiety.  No memory loss.   Vital Signs BP 124/68 (BP Location: Left Arm, Cuff Size: Normal)   Pulse (!) 117   Ht 5' 6.5" (1.689 m)   Wt 195 lb 6.4 oz (88.6 kg)   SpO2 97%   BMI 31.07 kg/m    Physical Exam:  General- No distress,  A&Ox3, pleasant and appropriate ENT: No sinus tenderness, TM clear, pale nasal mucosa, no oral exudate,no post nasal drip, no LAN Cardiac: S1, S2, regular rate and rhythm, no murmur Chest: No wheeze/ rales/ dullness; no accessory muscle use, no nasal flaring, no sternal retractions Abd.: Soft Non-tender, obese, nondistended, bowel sounds positive Ext: No clubbing cyanosis,  edema Neuro:  Deconditioned at baseline Skin: No rashes, warm and dry Psych: normal mood and behavior   Assessment/Plan Dyspnea on exertion Suspect related to  deconditioning, and progression of obstructive disease Significant improvement with dyspnea on exertion after starting on Breo puff once daily Plan We will send in a prescription for Breo. Take 1 puff once daily as you have been doing. Rinse mouth after use. Follow up with Mark. Halford Chessman in 6 months. Please contact office for sooner follow up if symptoms do not improve or worsen or seek emergency care      Magdalen Spatz, NP 07/19/2016  9:37 PM

## 2016-07-20 ENCOUNTER — Telehealth: Payer: Self-pay | Admitting: Acute Care

## 2016-07-20 MED ORDER — FLUTICASONE FUROATE-VILANTEROL 100-25 MCG/INH IN AEPB: 1.0000 | INHALATION_SPRAY | Freq: Every day | RESPIRATORY_TRACT | 3 refills | Status: DC

## 2016-07-20 NOTE — Telephone Encounter (Signed)
Pt request Rx for Breo 100 to be sent to [facility]. Rx has been sent to preferred pharmacy. Pt aware and voiced his understanding. Nothing further needed.

## 2016-07-20 NOTE — Progress Notes (Signed)
I have reviewed and agree with assessment/plan.  Chesley Mires, MD [facility] Pulmonary/Critical Care 07/20/2016, 3:30 PM Pager:  [phone]

## 2016-07-20 NOTE — Telephone Encounter (Signed)
lmtcb x1 for pt. 

## 2016-07-20 NOTE — Telephone Encounter (Signed)
Patient is returning phone call [phone].

## 2016-07-28 ENCOUNTER — Telehealth: Payer: Self-pay | Admitting: Pulmonary Disease

## 2016-07-28 NOTE — Telephone Encounter (Signed)
Can bring him in for a six minute walk test.

## 2016-07-28 NOTE — Telephone Encounter (Signed)
Attempted to call Mark Rivers at [facility].  Will try and call him back---no VM picked up.    Called and spoke with the pt and he stated that his breathing has gotten worse and he is wanting to keep his oxygen.  Pt stated that he does use [facility]---he stated that they were to pick his oxygen up but he wants to keep this now.  I did not see where we sent an order in for pick up.  Will try and call Corene Cornea back today.

## 2016-07-28 NOTE — Telephone Encounter (Signed)
Called and spoke with Corene Cornea from [facility] and he stated that the pt only qualified from the hospital visit per medicare at 89% and this would only last for 3 months.    Corene Cornea stated that the appt with SG on 7/11 does not show that the pt desaturated while walking and his sats remained pretty decent.    Corene Cornea stated that with the phone call today with the pt stated that he is having issues with his breathing now and wants to keep the oxygen---may end up being/falling into the category of an acute spell and medicare would not cover the oxygen for this episode either.  VS please advise. Thanks

## 2016-07-31 NOTE — Telephone Encounter (Signed)
Spoke with pt and he is aware of VS's message. Pt agreed to the walk test and this was scheduled for tomorrow. He had no additional questions at this time. Nothing further is needed

## 2016-08-01 ENCOUNTER — Ambulatory Visit: Payer: Medicare Other | Admitting: *Deleted

## 2016-08-01 ENCOUNTER — Telehealth: Payer: Self-pay

## 2016-08-01 DIAGNOSIS — J432 Centrilobular emphysema: Secondary | ICD-10-CM

## 2016-08-01 DIAGNOSIS — J431 Panlobular emphysema: Secondary | ICD-10-CM

## 2016-08-01 NOTE — Telephone Encounter (Signed)
Pt came in for Ambulatory pulse ox. Pt completed test with no desats or complaints.  Pt's heart rate ranged from 110 to 148 during test and was irregular. Pt reported he has f/u with cardiology for 09/2016.  Will route to VS to make aware.

## 2016-08-01 NOTE — Progress Notes (Signed)
Pt in office for Ambulatory pulse Oximetry test only.

## 2016-08-02 NOTE — Telephone Encounter (Signed)
Noted  

## 2016-08-09 ENCOUNTER — Telehealth: Payer: Self-pay | Admitting: Pulmonary Disease

## 2016-08-09 NOTE — Telephone Encounter (Signed)
Okay to have him come back for another walk.

## 2016-08-09 NOTE — Telephone Encounter (Signed)
Spoke with patient. He stated that Mark Rivers is supposed to come by and pick up his 02 either today or tomorrow. He called them back and told them that he still needs his o2. Stated that his O2 has been staying around 98% in the mornings but he is still having SOB episodes. He wants to come back in for another walk to qualify for O2 again.   VS, please advise. Thanks!

## 2016-08-09 NOTE — Telephone Encounter (Signed)
Spoke with patient. He has been scheduled for another walk on Friday August 3rd at 10am. He verbalized understanding. Nothing else needed at time of call.

## 2016-08-11 ENCOUNTER — Telehealth: Payer: Self-pay | Admitting: Pulmonary Disease

## 2016-08-11 ENCOUNTER — Ambulatory Visit (INDEPENDENT_AMBULATORY_CARE_PROVIDER_SITE_OTHER): Payer: Medicare Other | Admitting: *Deleted

## 2016-08-11 DIAGNOSIS — R0609 Other forms of dyspnea: Secondary | ICD-10-CM

## 2016-08-11 DIAGNOSIS — J9610 Chronic respiratory failure, unspecified whether with hypoxia or hypercapnia: Secondary | ICD-10-CM

## 2016-08-11 NOTE — Telephone Encounter (Signed)
Patient came in this morning to be re-qualified for his O2. At the beginning of the test, he stated that he already had some SOB but no chest pain. He was able to complete all 3 laps but never dropped below 91%. He had to stop after each lap due to the SOB and pain in his legs. He stated that the leg pain was not a new symptom but it has been getting worse with the recent weather. He also stated that [facility] was scheduled to pick up his O2 today but he feels that he still needs it. Advised patient that I will document everything from the walk and let Dr. Halford Chessman know.   He verbalized understanding.   Will route to Dr. Halford Chessman so that he is aware.

## 2016-08-11 NOTE — Progress Notes (Signed)
Ambulatory walk for oxygen

## 2016-08-13 ENCOUNTER — Ambulatory Visit (HOSPITAL_BASED_OUTPATIENT_CLINIC_OR_DEPARTMENT_OTHER): Payer: Medicare Other

## 2016-08-13 NOTE — Telephone Encounter (Signed)
Please arrange for room air arterial blood gas.

## 2016-08-14 NOTE — Telephone Encounter (Signed)
Left message for patient to make aware of Dr. Juanetta Gosling recs. Will place order once he is aware.

## 2016-08-14 NOTE — Telephone Encounter (Signed)
Patient is returning phone call. [phone].

## 2016-08-14 NOTE — Telephone Encounter (Signed)
Spoke with patient. Explained to him the reasoning of the ABG. He verbalized understanding and wishes to proceed. Order has been placed. He is aware that this will need to be done at the hospital and not our office.

## 2016-08-16 ENCOUNTER — Ambulatory Visit (HOSPITAL_COMMUNITY)
Admission: RE | Admit: 2016-08-16 | Discharge: 2016-08-16 | Disposition: A | Discharge status: Home / Self Care | Payer: Medicare Other | Source: Ambulatory Visit | Attending: Pulmonary Disease | Admitting: Pulmonary Disease

## 2016-08-16 DIAGNOSIS — J9610 Chronic respiratory failure, unspecified whether with hypoxia or hypercapnia: Secondary | ICD-10-CM | POA: Insufficient documentation

## 2016-08-16 LAB — BLOOD GAS, ARTERIAL
ACID-BASE EXCESS: 5.1 mmol/L — AB (ref 0.0–2.0)
BICARBONATE: 28.2 mmol/L — AB (ref 20.0–28.0)
Drawn by: 211791
O2 SAT: 88.6 %
PATIENT TEMPERATURE: 98.6
PO2 ART: 57.5 mmHg — AB (ref 83.0–108.0)
pCO2 arterial: 36.5 mmHg (ref 32.0–48.0)
pH, Arterial: 7.501 — ABNORMAL HIGH (ref 7.350–7.450)

## 2016-08-18 ENCOUNTER — Emergency Department (HOSPITAL_BASED_OUTPATIENT_CLINIC_OR_DEPARTMENT_OTHER)
Admission: EM | Admit: 2016-08-18 | Discharge: 2016-08-18 | Disposition: A | Discharge status: Home / Self Care | Payer: Medicare Other | Attending: Emergency Medicine | Admitting: Emergency Medicine

## 2016-08-18 ENCOUNTER — Encounter (HOSPITAL_BASED_OUTPATIENT_CLINIC_OR_DEPARTMENT_OTHER): Payer: Self-pay | Admitting: Emergency Medicine

## 2016-08-18 ENCOUNTER — Emergency Department (HOSPITAL_BASED_OUTPATIENT_CLINIC_OR_DEPARTMENT_OTHER): Payer: Medicare Other

## 2016-08-18 DIAGNOSIS — J90 Pleural effusion, not elsewhere classified: Secondary | ICD-10-CM | POA: Diagnosis not present

## 2016-08-18 DIAGNOSIS — R0602 Shortness of breath: Secondary | ICD-10-CM

## 2016-08-18 DIAGNOSIS — Z7982 Long term (current) use of aspirin: Secondary | ICD-10-CM | POA: Insufficient documentation

## 2016-08-18 DIAGNOSIS — Z955 Presence of coronary angioplasty implant and graft: Secondary | ICD-10-CM | POA: Diagnosis not present

## 2016-08-18 DIAGNOSIS — J449 Chronic obstructive pulmonary disease, unspecified: Secondary | ICD-10-CM | POA: Diagnosis not present

## 2016-08-18 DIAGNOSIS — I5033 Acute on chronic diastolic (congestive) heart failure: Secondary | ICD-10-CM | POA: Insufficient documentation

## 2016-08-18 DIAGNOSIS — N183 Chronic kidney disease, stage 3 (moderate): Secondary | ICD-10-CM | POA: Diagnosis not present

## 2016-08-18 DIAGNOSIS — Z79899 Other long term (current) drug therapy: Secondary | ICD-10-CM | POA: Insufficient documentation

## 2016-08-18 DIAGNOSIS — Z9981 Dependence on supplemental oxygen: Secondary | ICD-10-CM | POA: Diagnosis not present

## 2016-08-18 DIAGNOSIS — I130 Hypertensive heart and chronic kidney disease with heart failure and stage 1 through stage 4 chronic kidney disease, or unspecified chronic kidney disease: Secondary | ICD-10-CM | POA: Diagnosis not present

## 2016-08-18 DIAGNOSIS — I481 Persistent atrial fibrillation: Secondary | ICD-10-CM | POA: Diagnosis not present

## 2016-08-18 DIAGNOSIS — E1122 Type 2 diabetes mellitus with diabetic chronic kidney disease: Secondary | ICD-10-CM | POA: Insufficient documentation

## 2016-08-18 DIAGNOSIS — Z794 Long term (current) use of insulin: Secondary | ICD-10-CM | POA: Diagnosis not present

## 2016-08-18 DIAGNOSIS — Z87891 Personal history of nicotine dependence: Secondary | ICD-10-CM | POA: Insufficient documentation

## 2016-08-18 DIAGNOSIS — I4819 Other persistent atrial fibrillation: Secondary | ICD-10-CM

## 2016-08-18 LAB — COMPREHENSIVE METABOLIC PANEL
ALBUMIN: 3.7 g/dL (ref 3.5–5.0)
ALT: 50 U/L (ref 17–63)
AST: 71 U/L — ABNORMAL HIGH (ref 15–41)
Alkaline Phosphatase: 76 U/L (ref 38–126)
Anion gap: 11 (ref 5–15)
BUN: 29 mg/dL — AB (ref 6–20)
CHLORIDE: 99 mmol/L — AB (ref 101–111)
CO2: 28 mmol/L (ref 22–32)
Calcium: 8.8 mg/dL — ABNORMAL LOW (ref 8.9–10.3)
Creatinine, Ser: 1.62 mg/dL — ABNORMAL HIGH (ref 0.61–1.24)
GFR calc Af Amer: 45 mL/min — ABNORMAL LOW (ref >60)
GFR calc non Af Amer: 38 mL/min — ABNORMAL LOW (ref >60)
GLUCOSE: 130 mg/dL — AB (ref 65–99)
POTASSIUM: 3.6 mmol/L (ref 3.5–5.1)
Sodium: 138 mmol/L (ref 135–145)
Total Bilirubin: 1.2 mg/dL (ref 0.3–1.2)
Total Protein: 7.3 g/dL (ref 6.5–8.1)

## 2016-08-18 LAB — CBC
HEMATOCRIT: 28.5 % — AB (ref 39.0–52.0)
HEMOGLOBIN: 8.9 g/dL — AB (ref 13.0–17.0)
MCH: 28.9 pg (ref 26.0–34.0)
MCHC: 31.2 g/dL (ref 30.0–36.0)
MCV: 92.5 fL (ref 78.0–100.0)
Platelets: 246 10*3/uL (ref 150–400)
RBC: 3.08 MIL/uL — AB (ref 4.22–5.81)
RDW: 19.7 % — ABNORMAL HIGH (ref 11.5–15.5)
WBC: 13.7 10*3/uL — ABNORMAL HIGH (ref 4.0–10.5)

## 2016-08-18 LAB — TROPONIN I: TROPONIN I: 0.06 ng/mL — AB (ref <0.03)

## 2016-08-18 LAB — BRAIN NATRIURETIC PEPTIDE: B NATRIURETIC PEPTIDE 5: 1191.6 pg/mL — AB (ref 0.0–100.0)

## 2016-08-18 MED ORDER — FUROSEMIDE 10 MG/ML IJ SOLN
80.0000 mg | Freq: Once | INTRAMUSCULAR | Status: AC
  Administered 2016-08-18: 80 mg via INTRAVENOUS
  Filled 2016-08-18: qty 8

## 2016-08-18 MED ORDER — METOPROLOL TARTRATE 5 MG/5ML IV SOLN
5.0000 mg | Freq: Once | INTRAVENOUS | Status: AC
  Administered 2016-08-18: 5 mg via INTRAVENOUS
  Filled 2016-08-18: qty 5

## 2016-08-18 NOTE — ED Triage Notes (Signed)
Weakness to legs x 2 weeks and pain to arms x 2 days. SOB for the past couple weeks. Oxygen 3L/[place] at home

## 2016-08-18 NOTE — ED Notes (Signed)
ED Provider at bedside. 

## 2016-08-18 NOTE — ED Provider Notes (Signed)
[place] DEPT [facility] Provider Note   CSN: [id number] Arrival date & time: 08/18/16  1032     History   Chief Complaint Chief Complaint  Patient presents with  . Shortness of Breath    HPI Mark Rivers is a 82 y.o. male.  Patient presents with a complaint of generalized weakness and shortness of breath. Patient has a history of chronic atrial fibrillation. Not on blood thinners because his had bleeding problems in the past. Has a history of chronic diastolic congestive heart failure. Patient also has a history of COPD. Normally on 3 L of oxygen at home at all times.  Patients notice significant increased shortness of breath when he walks to feed his chickens and ducks. However he does that without his oxygen on. Patient had an admission in April at [facility] for similar symptoms. Patient required diuresis with Lasix at that time. Patient recently had his Lasix decreased by his kidney doctor he was taking 40 mg twice a day and is down to 40 mg once a day. Patient denies any chest pain. Denies any fevers.      Past Medical History:  Diagnosis Date  . Allergy   . Back pain   . Chronic diastolic CHF (congestive heart failure) ([place]) 12/21/2015   A. Echo 10/17: Moderate LVH, EF 55-60, normal wall motion, grade 2 diastolic dysfunction, mild aortic stenosis (mean 14, peak 34), MAC, mild LAE    . Chronic respiratory failure ([place])   . CKD (chronic kidney disease) stage 3, GFR 30-59 ml/min 03/04/2014  . Coronary artery disease    a. s/p remote POBA in 1988 // b. NSTEMI in 10/17 >> LHC with 99% LM stenosis >> emergent CABG (L-LAD, S-OM2, S-PDA) - post op course complicated (prolonged ventilation, s/p Trach, AFib, anemia req transfusion, bacteremia >> DC to LTAC)  . Diabetes mellitus   . Difficult intubation    difficult airway/FYI note 11/01/2015  . Esophageal adenocarcinoma ([place])    a. found by EGD 03/2016..  . GERD (gastroesophageal reflux disease)   . GI bleed 01/2016     a. 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 F/u EGD with esophageal adenocarcinoma 03/2016.  [name] History of nuclear stress test    a. Myoview 6/14 - EF 51, no ischemia or scar; Low Risk  . Hyperlipemia   . Hypertension   . Methicillin resistant Staphylococcus epidermidis infection 11/2015   a. during adm for CABG.  . Neuropathy   . On home oxygen therapy    "2L; 24/7" (02/09/2016)  . Persistent atrial fibrillation ([place]) 10/25/2015  . Sinus bradycardia    a. Prior HR 54 when in sinus rhythm 11/2015.  . Stroke [facility])    a. old L pontine infarcts seen on CT 04/2016.    Patient Active Problem List   Diagnosis Date Noted  . Hypoxemia 06/21/2016  . Physical deconditioning 06/21/2016  . Hypokalemia 05/08/2016  . Vertigo   . Acute on chronic respiratory failure ([place]) 05/07/2016  . 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 Malignant neoplasm of stomach ([place]) 02/22/2016  . Upper GI bleed   . Duodenitis determined by biopsy   . Panlobular emphysema ([place])   . Essential hypertension   . Controlled diabetes mellitus type 2 with complications ([place])   . Influenza A 02/11/2016  . Duodenitis:Per EGD 02/11/2016 02/11/2016  . Warfarin-induced coagulopathy (  [place])   . Malnutrition of moderate degree 02/10/2016  . Symptomatic anemia 02/09/2016  . GI bleed 02/09/2016  . Dyspnea on exertion 01/09/2016  . Monitoring for long-term anticoagulant use 12/24/2015  . Acute on chronic diastolic heart failure ([place]) 12/21/2015  . Pressure injury of skin 11/17/2015  . Surgery, elective   . Coronary artery disease involving native coronary artery of native heart without angina pectoris 10/27/2015  . S/P CABG x 3   . History of MI (myocardial infarction)   . Persistent atrial fibrillation ([place]) 10/25/2015  . Seasonal allergic conjunctivitis 08/25/2015   . History of tobacco abuse 08/25/2015  . Diabetes mellitus ([place]) 03/11/2015  . Diabetes mellitus with stage 3 chronic kidney disease ([place]) 03/11/2015  . Metabolic bone disease 31/54/0086  . Vitamin D deficiency 03/11/2015  . Atopic dermatitis 02/24/2015  . Hip pain 09/24/2014  . Need for immunization against influenza 09/24/2014  . Benign essential hypertension 03/13/2014  . Generalized osteoarthritis of multiple sites 03/13/2014  . CKD (chronic kidney disease) stage 3, GFR 30-59 ml/min 03/04/2014  . Acute stress disorder 11/12/2013  . Seasonal and perennial allergic rhinitis 11/12/2013  . Allergic urticaria 11/12/2013  . Benign neoplasm of colon 11/12/2013  . Causalgia of upper extremity 11/12/2013  . Cervical radiculopathy 11/12/2013  . Neck pain 11/12/2013  . Chronic fatigue 11/12/2013  . Hearing loss 11/12/2013  . Insomnia 11/12/2013  . Obesity 11/12/2013  . Premature ventricular contractions 11/12/2013  . Psychosexual dysfunction with inhibited sexual excitement 11/12/2013  . Hyperlipemia 09/11/2013  . Type 2 diabetes mellitus, uncontrolled ([place]) 09/11/2013  . Pulmonary emphysema ([place]) 04/22/2013  . Esophageal reflux   . Benign hypertension with chronic kidney disease, stage III   . Allergy   . Neuropathy [facility])     Past Surgical History:  Procedure Laterality Date  . CARDIAC CATHETERIZATION N/A 10/26/2015   Procedure: Left Heart Cath and Coronary Angiography;  Surgeon: Troy Sine, MD;  Location: [place] CV LAB;  Service: Cardiovascular;  Laterality: N/A;  . CATARACT EXTRACTION W/ INTRAOCULAR LENS  IMPLANT, BILATERAL  2008  . COLONOSCOPY    . CORONARY ANGIOPLASTY WITH STENT PLACEMENT  1988  . CORONARY ARTERY BYPASS GRAFT N/A 10/26/2015   Procedure: CORONARY ARTERY BYPASS GRAFTING (CABG) x3(LIMA to LA, SVG to OM1, SVG to PDA) with EVH from the left thigh and partial lower leg greater saphenous vein and left internal mammary artery;  Surgeon: Ivin Poot, MD;   Location: [place];  Service: Open Heart Surgery;  Laterality: N/A;  . ESOPHAGOGASTRODUODENOSCOPY (EGD) WITH PROPOFOL N/A 02/11/2016   Procedure: ESOPHAGOGASTRODUODENOSCOPY (EGD) WITH PROPOFOL;  Surgeon: Ladene Artist, MD;  Location: [facility] ENDOSCOPY;  Service: Endoscopy;  Laterality: N/A;  . EUS N/A 03/09/2016   Procedure: UPPER ENDOSCOPIC ULTRASOUND (EUS) RADIAL;  Surgeon: Milus Banister, MD;  Location: [facility] ENDOSCOPY;  Service: Endoscopy;  Laterality: N/A;  . TEE WITHOUT CARDIOVERSION N/A 10/26/2015   Procedure: TRANSESOPHAGEAL ECHOCARDIOGRAM (TEE);  Surgeon: Ivin Poot, MD;  Location: [place];  Service: Open Heart Surgery;  Laterality: N/A;  . TONSILLECTOMY         Home Medications    Prior to Admission medications   Medication Sig Start Date End Date Taking? Authorizing Provider  aspirin EC 81 MG tablet Take 81 mg by mouth daily.   Yes [provider]  atorvastatin (LIPITOR) 80 MG tablet Take 1 tablet (80 mg total) by mouth daily. 03/31/16  Yes Josue Hector, MD  cholecalciferol (VITAMIN D) 1000 units  tablet Take 1,000 Units by mouth daily.   Yes [provider]  diphenhydramine-acetaminophen (TYLENOL PM) 25-500 MG TABS tablet Take 2 tablets by mouth at bedtime.   Yes [provider]  fenofibrate 54 MG tablet Take 54 mg by mouth daily.   Yes [provider]  fluticasone furoate-vilanterol (BREO ELLIPTA) 100-25 MCG/INH AEPB Inhale 1 puff into the lungs daily. 07/20/16  Yes Magdalen Spatz, NP  furosemide (LASIX) 40 MG tablet Take 1 tablet (40 mg total) by mouth daily. 06/14/16  Yes Josue Hector, MD  Glucosamine-Chondroit-Vit C-Mn (GLUCOSAMINE CHONDR 1500 COMPLX PO) Take 2 tablets by mouth daily.    Yes [provider]  Insulin Glargine (TOUJEO SOLOSTAR) 300 UNIT/ML SOPN Inject 30 Units into the skin at bedtime.   Yes [provider]  loratadine (CLARITIN) 10 MG tablet Take 1 tablet (10 mg total) by mouth daily. 02/14/16  Yes Allie Bossier,  MD  metoprolol succinate (TOPROL-XL) 50 MG 24 hr tablet Take 1 tablet (50 mg total) by mouth daily. Take with or immediately following a meal. 02/16/16 08/18/16 Yes Weaver, Amelia Burgard T, PA-C  OXYGEN Inhale 2 L/min into the lungs continuous.   Yes [provider]  pantoprazole (PROTONIX) 40 MG tablet Take 1 tablet (40 mg total) by mouth daily at 6 (six) AM. 02/14/16  Yes Allie Bossier, MD  potassium chloride SA (K-DUR,KLOR-CON) 20 MEQ tablet Take 2 tablets (40 mEq total) by mouth daily. 06/14/16  Yes Josue Hector, MD  traMADol (ULTRAM) 50 MG tablet Take by mouth every 6 (six) hours as needed.   Yes [provider]  traZODone (DESYREL) 100 MG tablet Take 100 mg by mouth at bedtime.    Yes [provider]  vitamin C (ASCORBIC ACID) 500 MG tablet Take 500 mg by mouth daily.   Yes [provider]    Family History Family History  Problem Relation Age of Onset  . Diabetes Father   . Colon cancer Neg Hx   . Esophageal cancer Neg Hx   . Stomach cancer Neg Hx   . Rectal cancer Neg Hx   . Allergic rhinitis Neg Hx   . Angioedema Neg Hx   . Asthma Neg Hx   . Eczema Neg Hx   . Immunodeficiency Neg Hx   . Urticaria Neg Hx   . Liver disease Neg Hx     Social History Social History  Substance Use Topics  . Smoking status: Former Smoker    Packs/day: 2.00    Years: 30.00    Types: Cigarettes    Quit date: 06/28/2004  . Smokeless tobacco: Never Used  . Alcohol use No     Comment: QUIT 06/28/2004     Allergies   Amoxicillin; Cephalexin; and Penicillin g   Review of Systems Review of Systems  Constitutional: Positive for fatigue. Negative for fever.  HENT: Negative for congestion.   Eyes: Negative for visual disturbance.  Respiratory: Positive for shortness of breath.   Cardiovascular: Positive for leg swelling. Negative for chest pain.  Gastrointestinal: Negative for abdominal pain.  Genitourinary: Negative for dysuria.  Musculoskeletal: Negative for  back pain.  Skin: Negative for rash.  Neurological: Positive for weakness.  Hematological: Does not bruise/bleed easily.  Psychiatric/Behavioral: Negative for confusion.     Physical Exam Updated Vital Signs BP 135/77   Pulse (!) 118   Temp 99 F (37.2 C) (Oral)   Resp (!) 27   Ht 1.727 m (_0 )  Wt 90.7 kg (200 lb)   SpO2 (!) 76%   BMI 30.41 kg/m   Physical Exam  Constitutional: He is oriented to person, place, and time. He appears well-developed and well-nourished. No distress.  HENT:  Head: Normocephalic and atraumatic.  Mouth/Throat: Oropharynx is clear and moist.  Eyes: Pupils are equal, round, and reactive to light. Conjunctivae and EOM are normal.  Neck: Normal range of motion. Neck supple.  Cardiovascular:  Irregular heart rate slightly tachycardic  Pulmonary/Chest: Effort normal and breath sounds normal. No respiratory distress. He has no wheezes. He has no rales.  Abdominal: Soft. Bowel sounds are normal. There is no tenderness.  Musculoskeletal: Normal range of motion. He exhibits edema.  Neurological: He is alert and oriented to person, place, and time. No cranial nerve deficit or sensory deficit. He exhibits normal muscle tone. Coordination normal.  Skin: Skin is warm.  Nursing note and vitals reviewed.    ED Treatments / Results  Labs (all labs ordered are listed, but only abnormal results are displayed) Labs Reviewed  CBC - Abnormal; Notable for the following:       Result Value   WBC 13.7 (*)    RBC 3.08 (*)    Hemoglobin 8.9 (*)    HCT 28.5 (*)    RDW 19.7 (*)    All other components within normal limits  TROPONIN I - Abnormal; Notable for the following:    Troponin I 0.06 (*)    All other components within normal limits  BRAIN NATRIURETIC PEPTIDE - Abnormal; Notable for the following:    B Natriuretic Peptide 1,191.6 (*)    All other components within normal limits  COMPREHENSIVE METABOLIC PANEL - Abnormal; Notable for the following:     Chloride 99 (*)    Glucose, Bld 130 (*)    BUN 29 (*)    Creatinine, Ser 1.62 (*)    Calcium 8.8 (*)    AST 71 (*)    GFR calc non Af Amer 38 (*)    GFR calc Af Amer 45 (*)    All other components within normal limits    EKG  EKG Interpretation  Date/Time:  Friday August 18 2016 10:46:07 EDT Ventricular Rate:  130 PR Interval:    QRS Duration: 97 QT Interval:  355 QTC Calculation: 523 R Axis:   91 Text Interpretation:  Atrial fibrillation Ventricular premature complex Right axis deviation Borderline low voltage, extremity leads Repolarization abnormality, prob rate related Prolonged QT interval Other than PVC No significant change since last tracing Confirmed by Fredia Sorrow [id number]) on 08/18/2016 11:42:08 AM       Radiology Dg Chest 2 View  Result Date: 08/18/2016 CLINICAL DATA:  Shortness of breath. EXAM: CHEST  2 VIEW COMPARISON:  Radiographs of May 09, 2016. FINDINGS: Stable cardiomegaly. Sternotomy wires are noted. No pneumothorax is noted. Status post coronary artery bypass graft. Mild bilateral pleural effusions are noted. Mild bibasilar subsegmental atelectasis is noted. Bony thorax is unremarkable. IMPRESSION: Mild bibasilar subsegmental atelectasis. Mild bilateral pleural effusions. Electronically Signed   By: Marijo Conception, M.D.   On: 08/18/2016 11:19    Procedures Procedures (including critical care time)  Medications Ordered in ED Medications  furosemide (LASIX) injection 80 mg (80 mg Intravenous Given 08/18/16 1254)  metoprolol tartrate (LOPRESSOR) injection 5 mg (5 mg Intravenous Given 08/18/16 1254)     Initial Impression / Assessment and Plan / ED Course  I have reviewed the triage vital signs and the nursing notes.  Pertinent labs & imaging results that were available during my care of the patient were reviewed by me and considered in my medical decision making (see chart for details).     Patient's workup here troponin was negative. EKG showed  atrial fibrillation. Noted that patient's heart rate at times got up to 120. Patient claimed he had taken his beta blocker and his Lasix medicine this morning. Chest x-ray showed evidence of bilateral pleural effusions. Some interstitial edema but no frank pulmonary edema. Patient's BNP was elevated. Patient's renal function although not normal without any acute changes. Electrolytes without significant abnormalities.  Patient's oxygen saturations at rest on 3 L was in mid to upper 90s. Patient received 80 mg of Lasix IV with diuresis. Also gave him 5 mg of Lopressor IV. Heart rate came down to below 100. Patient diuresed well with the Lasix. Patient was ambulated on oxygen and sats never went below 91%. Patient also did not become acutely short of breath. Overall patient states that he feels much better.  Patient will be instructed to double Lasix. Back to his normal dose close follow-up with cardiology this week. And continue the rest of his meds.  Patient will return for any new or worse symptoms. Difficult to predict how well he'll do over the weekend but right now he is improved significantly.  Final Clinical Impressions(s) / ED Diagnoses   Final diagnoses:  Shortness of breath  Pleural effusion, bilateral  Acute on chronic diastolic congestive heart failure (HCC)  Persistent atrial fibrillation (HCC)    New Prescriptions New Prescriptions   No medications on file     Fredia Sorrow, MD 08/18/16 1455

## 2016-08-18 NOTE — ED Notes (Signed)
Pt helped into wheelchair and taken to room. RT and RN aware.  Pt helped into gown. On cardiac monitor and auto VS. 2LPM o2 via [place] per home O2

## 2016-08-18 NOTE — Discharge Instructions (Signed)
Return for any new or worse symptoms. Recommend increasing your Lasix to twice a day. Make an appointment to follow-up with cardiology next week. Continue to use oxygen as directed.  If you get increased shortness of breath it is important that you return for reevaluation.

## 2016-08-18 NOTE — ED Notes (Signed)
Primary rn Keli and Dr. Rogene Houston alerted to troponin of 0.06

## 2016-08-21 ENCOUNTER — Telehealth: Payer: Self-pay | Admitting: Cardiovascular Disease

## 2016-08-21 NOTE — Telephone Encounter (Signed)
Made an appointment for patient to see Dr. Johnsie Cancel on Thursday per Urgent Care visit on 08/18/16.

## 2016-08-21 NOTE — Telephone Encounter (Signed)
Left message for patient to call back  

## 2016-08-21 NOTE — Telephone Encounter (Signed)
New message      Pt was seen at an urgent care recently.  He says he needs to report to the nurse what they said.  Please call

## 2016-08-22 NOTE — Progress Notes (Signed)
Cardiology Office Note:    Date:  08/24/2016   ID:  Mark Rivers, DOB 1934-03-07, MRN [id number]  PCP:  [name], MD  Cardiologist:Dr. Jenkins Rouge Electrophysiologist: n/a Nephrology: Dr. Olivia Mackie Phoebe Perch)  Referring MD: [name], MD   No chief complaint on file.   History of Present Illness:    Mark Rivers is a 82 y.o. male with a hx of CAD, persistent AFib, DM2, CKD, HTN, HL, COPD.  He underwent remote angioplasty in 1988.  He was admitted in 10/17 with NSTEMI in the setting of AF with RVR.  He had critical LM stenosis on LHC and underwent emergent CABG (L-LAD, S-OM2, S-PDA).  He had a complex post op course notable for blood loss anemia requiring transfusion with PRBCs, difficulty in weaning from the ventilator resulting in tracheostomy, chest tube placement for R pleural effusion, MRSE bacteremia and AKI which limited diuresis.  He was placed on Coumadin for recurrent AF.  He was DC to [facility].    He was admitted 09/6757 with a/c diastolic CHF.  Last seen here in follow up 01/17/16. Placed on amiodarone with plans for [facility] in a few weeks  He was admitted again 1/31-2/4 with an upper GI bleed c/b AKI, influenza.  He required transfusion with PRBCs x 1.  His diuretics were held due to hypotension and AKI.  These were not resumed at DC.  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  Coumadin was held but resumed at DC.  He was taken off of ASA.  Of note, he had Digoxin added to his medical regimen at some point (prior to admission).     Had EGD 03/09/16 Dr [name] and had adneocarcinoma Since he is a poor surgical candidate referred to [facility] for possible endoscopic Rx   Brother has moved from [place] to be with him since [name] has died and he has been very    Helpful   Seen in ER 08/18/16 with dyspnea Normally wears 3L oxygen but not when feeding chickens/ducks at home  Nephrology Had decreased his lasix from 40 bid to daily because of his CRF Troponin negative Sats on 3L upper 90's AFib rate up a bit Given iv lopressor and iv lasix D/c with lasix back to 40 bid  BNP was 1191 CXR small effusions and mild interstitial edema  Prior CV studies:   The following studies were reviewed today:  Echo 01/11/16 Mod LVH, EF 55-60, inf AK, suspect mild aortic stenosis, mod MAC, mod LAE, mild reduced RVSF, mild RAE, PASP 47  Limited echo 11/09/15 EF 50-55, normal wall motion, ventricular septal dyssynergy, moderate MAC, mild MR, possible fibrinous linear projection originating from mitral annular calcification within LA  Complete echo 11/02/15 Moderate LVH, EF 55-60, normal wall motion, grade 2 diastolic dysfunction, mild aortic stenosis (mean 14, peak 34), MAC, mild LAE  LHC 10/26/15 LM distal 99 thrombotic LAD okay LCx okay RCA proximal 80, mid 30 EF 40-45  Myoview 10/16 EF 51, no ischemia or scar; Low Risk  Echo 6/14 Mild to moderate LVH, probable mild to moderately reduced LVEF, moderate LAE, MAC, mild MR, mild TR  Myoview 6/14 No ischemia, EF 49  Past Medical History:  Diagnosis Date  . Allergy   . Back pain   . Chronic diastolic CHF (congestive heart failure) ([place])  12/21/2015   A. Echo 10/17: Moderate LVH, EF 55-60, normal wall motion, grade 2 diastolic dysfunction, mild aortic stenosis (mean 14, peak 34), MAC, mild LAE    . Chronic respiratory failure ([place])   . CKD (chronic kidney disease) stage 3, GFR 30-59 ml/min 03/04/2014  . Coronary artery disease    a. s/p remote POBA in 1988 // b. NSTEMI in 10/17 >> LHC with 99% LM stenosis >> emergent CABG (L-LAD, S-OM2, S-PDA) - post op course complicated (prolonged ventilation, s/p Trach, AFib, anemia req transfusion, bacteremia >> DC to LTAC)  . Diabetes mellitus   . Difficult  intubation    difficult airway/FYI note 11/01/2015  . Esophageal adenocarcinoma ([place])    a. found by EGD 03/2016..  . GERD (gastroesophageal reflux disease)   . GI bleed 01/2016   a. 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 F/u EGD with esophageal adenocarcinoma 03/2016.  [name] History of nuclear stress test    a. Myoview 6/14 - EF 51, no ischemia or scar; Low Risk  . Hyperlipemia   . Hypertension   . Methicillin resistant Staphylococcus epidermidis infection 11/2015   a. during adm for CABG.  . Neuropathy   . On home oxygen therapy    "2L; 24/7" (02/09/2016)  . Persistent atrial fibrillation ([place]) 10/25/2015  . Sinus bradycardia    a. Prior HR 54 when in sinus rhythm 11/2015.  . Stroke [facility])    a. old L pontine infarcts seen on CT 04/2016.    Past Surgical History:  Procedure Laterality Date  . CARDIAC CATHETERIZATION N/A 10/26/2015   Procedure: Left Heart Cath and Coronary Angiography;  Surgeon: Troy Sine, MD;  Location: [place] CV LAB;  Service: Cardiovascular;  Laterality: N/A;  . CATARACT EXTRACTION W/ INTRAOCULAR LENS  IMPLANT, BILATERAL  2008  . COLONOSCOPY    . CORONARY ANGIOPLASTY WITH STENT PLACEMENT  1988  . CORONARY ARTERY BYPASS GRAFT N/A 10/26/2015   Procedure: CORONARY ARTERY BYPASS GRAFTING (CABG) x3(LIMA to LA, SVG to OM1, SVG to PDA) with EVH from the left thigh and partial lower leg greater saphenous vein and left internal mammary artery;  Surgeon: Ivin Poot, MD;  Location: [place];  Service: Open Heart Surgery;  Laterality: N/A;  . ESOPHAGOGASTRODUODENOSCOPY (EGD) WITH PROPOFOL N/A 02/11/2016   Procedure: ESOPHAGOGASTRODUODENOSCOPY (EGD) WITH PROPOFOL;  Surgeon: Ladene Artist, MD;  Location: [facility] ENDOSCOPY;  Service: Endoscopy;  Laterality: N/A;  . EUS N/A 03/09/2016   Procedure: UPPER  ENDOSCOPIC ULTRASOUND (EUS) RADIAL;  Surgeon: Milus Banister, MD;  Location: [facility] ENDOSCOPY;  Service: Endoscopy;  Laterality: N/A;  . TEE WITHOUT CARDIOVERSION N/A 10/26/2015   Procedure: TRANSESOPHAGEAL ECHOCARDIOGRAM (TEE);  Surgeon: Ivin Poot, MD;  Location: [place];  Service: Open Heart Surgery;  Laterality: N/A;  . TONSILLECTOMY      Current Medications: Current Meds  Medication Sig  . aspirin EC 81 MG tablet Take 81 mg by mouth daily.  [name] atorvastatin (LIPITOR) 80 MG tablet Take 1 tablet (80 mg total) by mouth daily.  . cholecalciferol (VITAMIN D) 1000 units tablet Take 1,000 Units by mouth daily.  . diphenhydramine-acetaminophen (TYLENOL PM) 25-500 MG TABS tablet Take 2 tablets by mouth at bedtime.  . fenofibrate 54 MG tablet Take 54 mg by mouth daily.  . fluticasone furoate-vilanterol (BREO ELLIPTA) 100-25 MCG/INH AEPB Inhale 1 puff into  the lungs daily.  . Glucosamine-Chondroit-Vit C-Mn (GLUCOSAMINE CHONDR 1500 COMPLX PO) Take 2 tablets by mouth daily.   . Insulin Glargine (TOUJEO SOLOSTAR) 300 UNIT/ML SOPN Inject 30 Units into the skin at bedtime.  [name] loratadine (CLARITIN) 10 MG tablet Take 1 tablet (10 mg total) by mouth daily.  . OXYGEN Inhale 2 L/min into the lungs continuous.  . pantoprazole (PROTONIX) 40 MG tablet Take 1 tablet (40 mg total) by mouth daily at 6 (six) AM.  . potassium chloride SA (K-DUR,KLOR-CON) 20 MEQ tablet Take 2 tablets (40 mEq total) by mouth daily.  . traMADol (ULTRAM) 50 MG tablet Take by mouth every 6 (six) hours as needed.  . traZODone (DESYREL) 100 MG tablet Take 100 mg by mouth at bedtime.   . vitamin C (ASCORBIC ACID) 500 MG tablet Take 500 mg by mouth daily.  . [DISCONTINUED] furosemide (LASIX) 40 MG tablet Take 1 tablet (40 mg total) by mouth daily.     Allergies:   Amoxicillin; Cephalexin; and Penicillin g   Social History   Social History  . Marital status: Widowed    Spouse name: N/A  . Number of children: N/A  . Years of  education: N/A   Occupational History  . retired    Social History Main Topics  . Smoking status: Former Smoker    Packs/day: 2.00    Years: 30.00    Types: Cigarettes    Quit date: 06/28/2004  . Smokeless tobacco: Never Used  . Alcohol use No     Comment: QUIT 06/28/2004  . Drug use: No  . Sexual activity: Yes   Other Topics Concern  . None   Social History Narrative  . None     Family History  Problem Relation Age of Onset  . Diabetes Father   . Colon cancer Neg Hx   . Esophageal cancer Neg Hx   . Stomach cancer Neg Hx   . Rectal cancer Neg Hx   . Allergic rhinitis Neg Hx   . Angioedema Neg Hx   . Asthma Neg Hx   . Eczema Neg Hx   . Immunodeficiency Neg Hx   . Urticaria Neg Hx   . Liver disease Neg Hx      ROS:   Please see the history of present illness.    Review of Systems  HENT: Positive for hearing loss.   Cardiovascular: Positive for irregular heartbeat.  Respiratory: Positive for cough and wheezing.    All other systems reviewed and are negative.   EKGs/Labs/Other Test Reviewed:    EKG:  02/16/16  AFib, HR 66  Recent Labs: 11/27/2015: TSH 2.639 01/12/2016: Magnesium 2.1 08/18/2016: ALT 50; B Natriuretic Peptide 1,191.6; BUN 29; Creatinine, Ser 1.62; Hemoglobin 8.9; Platelets 246; Potassium 3.6; Sodium 138   Recent Lipid Panel    Component Value Date/Time   TRIG 268 (H) 11/05/2015 1325     Physical Exam:    VS:  BP 136/80   Pulse 100   Ht _0  (1.702 m)   Wt 200 lb (90.7 kg)   SpO2 95%   BMI 31.32 kg/m     Wt Readings from Last 3 Encounters:  08/24/16 200 lb (90.7 kg)  08/18/16 200 lb (90.7 kg)  07/19/16 195 lb 6.4 oz (88.6 kg)     Physical Exam  Constitutional: He is oriented to person, place, and time. He appears well-developed and well-nourished. No distress.  HENT:  Head: Normocephalic and atraumatic.  Eyes: No scleral icterus.  Neck:  No JVD present.  Cardiovascular: Normal rate, S1 normal and S2 normal.  An irregularly  irregular rhythm present.  No murmur heard. Pulmonary/Chest: He has decreased breath sounds. He has rhonchi. He has no rales.  Abdominal: Soft. Bowel sounds are normal. He exhibits no distension.  Musculoskeletal: He exhibits edema.  Neurological: He is alert and oriented to person, place, and time.  Skin: Skin is warm and dry.  Psychiatric: He has a normal mood and affect.    ASSESSMENT:    1. Shortness of breath    PLAN:    In order of problems listed above:  1. CAD - s/p NSTEMI in the setting of AF with RVR in 10/17 followed by CABG.  He is now off of ASA due to recent GI bleed.  He is no longer on nitrates due to hypotension. He denies angina.  Continue beta-blocker, statin.    2. Persistent AF - He remains in AF.  Rate control is fine No coumadin until issues with esophageal cancer resolved   3. Chronic diastolic CHF - Lasix increased to 40 bid with last ER visit increase 60 bid BMET/BNP 2 weeks with CXR and f/u PA   4. CKD - His creatinine increased in the hospital.  He is due for FU with his nephrologist.  08/18/16 Cr 1.62   5. HTN - BP is controlled.  Adjust medications as noted.   6. COPD - f/u pulmonary on selective beta blocker left lung exam ? ILD wearing oxygen still CXR 08/18/16 small bilateral effuson and atelectasis 60 pack year smoker quit 2006  PFTls with progressive disease And on Breo  7. Hx of GI bleed - with invasive esophageal cancer no coumadin has f/u at [facility] biopsy did show Adenocarcinoma Not clear to me what next Rx step is Told him to have Dr Fuller Plan discuss with [facility] And come up with a plan No coumadin for now    [employer]

## 2016-08-24 ENCOUNTER — Ambulatory Visit (INDEPENDENT_AMBULATORY_CARE_PROVIDER_SITE_OTHER): Payer: Medicare Other | Admitting: Cardiovascular Disease

## 2016-08-24 ENCOUNTER — Encounter: Payer: Self-pay | Admitting: Cardiovascular Disease

## 2016-08-24 VITALS — BP 136/80 | HR 100 | Ht 67.0 in | Wt 200.0 lb

## 2016-08-24 DIAGNOSIS — R0602 Shortness of breath: Secondary | ICD-10-CM | POA: Diagnosis not present

## 2016-08-24 DIAGNOSIS — I2510 Atherosclerotic heart disease of native coronary artery without angina pectoris: Secondary | ICD-10-CM

## 2016-08-24 MED ORDER — FUROSEMIDE 40 MG PO TABS: 60.0000 mg | ORAL_TABLET | Freq: Two times a day (BID) | ORAL | 3 refills | Status: DC

## 2016-08-24 NOTE — Patient Instructions (Signed)
Medication Instructions:  1. CHANGE LASIX TO 60 MG TWICE DAILY (THIS WILL BE 1 AND 1/2 TABLETS TWICE DAILY)  Labwork: IN 2 WEEKS YOU WILL NEED BMET, PRO BNP  Testing/Procedures: A chest x-ray takes a picture of the organs and structures inside the chest, including the heart, lungs, and blood vessels. This test can show several things, including, whether the heart is enlarges; whether fluid is building up in the lungs; and whether pacemaker / defibrillator leads are still in place. THIS IS TO BE DONE AT [facility]; IF YOU COULD GET THIS DONE SOMETIME IN THE NEXT WEEK    Follow-Up: 1. YOU WILL NEED AN APPT WITH APP IN THE NEXT 2-3 WEEKS  2. DR. Johnsie Cancel 3 MONTHS   Any Other Special Instructions Will Be Listed Below (If Applicable).     If you need a refill on your cardiac medications before your next appointment, please call your pharmacy.

## 2016-08-31 ENCOUNTER — Encounter (HOSPITAL_COMMUNITY): Payer: Self-pay | Admitting: Emergency Medicine

## 2016-08-31 ENCOUNTER — Emergency Department (HOSPITAL_COMMUNITY): Payer: Medicare Other

## 2016-08-31 ENCOUNTER — Ambulatory Visit: Payer: Medicare Other | Admitting: Internal Medicine

## 2016-08-31 ENCOUNTER — Inpatient Hospital Stay (HOSPITAL_COMMUNITY)
Admission: EM | Admit: 2016-08-31 | Discharge: 2016-09-02 | DRG: 291 | Disposition: A | Discharge status: Home Health | Payer: Medicare Other | Attending: Internal Medicine | Admitting: Internal Medicine

## 2016-08-31 DIAGNOSIS — C159 Malignant neoplasm of esophagus, unspecified: Secondary | ICD-10-CM | POA: Diagnosis present

## 2016-08-31 DIAGNOSIS — Z79899 Other long term (current) drug therapy: Secondary | ICD-10-CM | POA: Diagnosis not present

## 2016-08-31 DIAGNOSIS — R609 Edema, unspecified: Secondary | ICD-10-CM | POA: Diagnosis not present

## 2016-08-31 DIAGNOSIS — I5033 Acute on chronic diastolic (congestive) heart failure: Secondary | ICD-10-CM | POA: Diagnosis present

## 2016-08-31 DIAGNOSIS — E118 Type 2 diabetes mellitus with unspecified complications: Secondary | ICD-10-CM

## 2016-08-31 DIAGNOSIS — Z833 Family history of diabetes mellitus: Secondary | ICD-10-CM | POA: Diagnosis not present

## 2016-08-31 DIAGNOSIS — E119 Type 2 diabetes mellitus without complications: Secondary | ICD-10-CM

## 2016-08-31 DIAGNOSIS — Z9981 Dependence on supplemental oxygen: Secondary | ICD-10-CM | POA: Diagnosis not present

## 2016-08-31 DIAGNOSIS — Z880 Allergy status to penicillin: Secondary | ICD-10-CM

## 2016-08-31 DIAGNOSIS — W19XXXA Unspecified fall, initial encounter: Secondary | ICD-10-CM | POA: Diagnosis present

## 2016-08-31 DIAGNOSIS — N183 Chronic kidney disease, stage 3 unspecified: Secondary | ICD-10-CM | POA: Diagnosis present

## 2016-08-31 DIAGNOSIS — R5381 Other malaise: Secondary | ICD-10-CM | POA: Diagnosis present

## 2016-08-31 DIAGNOSIS — I10 Essential (primary) hypertension: Secondary | ICD-10-CM | POA: Diagnosis not present

## 2016-08-31 DIAGNOSIS — D638 Anemia in other chronic diseases classified elsewhere: Secondary | ICD-10-CM | POA: Diagnosis present

## 2016-08-31 DIAGNOSIS — R0902 Hypoxemia: Secondary | ICD-10-CM | POA: Diagnosis present

## 2016-08-31 DIAGNOSIS — E1122 Type 2 diabetes mellitus with diabetic chronic kidney disease: Secondary | ICD-10-CM | POA: Diagnosis present

## 2016-08-31 DIAGNOSIS — E876 Hypokalemia: Secondary | ICD-10-CM | POA: Diagnosis present

## 2016-08-31 DIAGNOSIS — Z7982 Long term (current) use of aspirin: Secondary | ICD-10-CM

## 2016-08-31 DIAGNOSIS — I509 Heart failure, unspecified: Secondary | ICD-10-CM

## 2016-08-31 DIAGNOSIS — I481 Persistent atrial fibrillation: Secondary | ICD-10-CM | POA: Diagnosis present

## 2016-08-31 DIAGNOSIS — D72829 Elevated white blood cell count, unspecified: Secondary | ICD-10-CM | POA: Diagnosis present

## 2016-08-31 DIAGNOSIS — I2510 Atherosclerotic heart disease of native coronary artery without angina pectoris: Secondary | ICD-10-CM | POA: Diagnosis present

## 2016-08-31 DIAGNOSIS — E1159 Type 2 diabetes mellitus with other circulatory complications: Secondary | ICD-10-CM | POA: Diagnosis not present

## 2016-08-31 DIAGNOSIS — E785 Hyperlipidemia, unspecified: Secondary | ICD-10-CM | POA: Diagnosis present

## 2016-08-31 DIAGNOSIS — E1165 Type 2 diabetes mellitus with hyperglycemia: Secondary | ICD-10-CM

## 2016-08-31 DIAGNOSIS — I130 Hypertensive heart and chronic kidney disease with heart failure and stage 1 through stage 4 chronic kidney disease, or unspecified chronic kidney disease: Secondary | ICD-10-CM | POA: Diagnosis not present

## 2016-08-31 DIAGNOSIS — R05 Cough: Secondary | ICD-10-CM | POA: Diagnosis not present

## 2016-08-31 DIAGNOSIS — R059 Cough, unspecified: Secondary | ICD-10-CM

## 2016-08-31 DIAGNOSIS — I4819 Other persistent atrial fibrillation: Secondary | ICD-10-CM | POA: Diagnosis present

## 2016-08-31 DIAGNOSIS — IMO0002 Reserved for concepts with insufficient information to code with codable children: Secondary | ICD-10-CM | POA: Diagnosis present

## 2016-08-31 DIAGNOSIS — J449 Chronic obstructive pulmonary disease, unspecified: Secondary | ICD-10-CM | POA: Diagnosis present

## 2016-08-31 DIAGNOSIS — Z951 Presence of aortocoronary bypass graft: Secondary | ICD-10-CM

## 2016-08-31 DIAGNOSIS — Z8673 Personal history of transient ischemic attack (TIA), and cerebral infarction without residual deficits: Secondary | ICD-10-CM

## 2016-08-31 DIAGNOSIS — R531 Weakness: Secondary | ICD-10-CM

## 2016-08-31 DIAGNOSIS — Z87891 Personal history of nicotine dependence: Secondary | ICD-10-CM

## 2016-08-31 DIAGNOSIS — Z881 Allergy status to other antibiotic agents status: Secondary | ICD-10-CM | POA: Diagnosis not present

## 2016-08-31 LAB — CBC
HCT: 27.5 % — ABNORMAL LOW (ref 39.0–52.0)
HCT: 30.5 % — ABNORMAL LOW (ref 39.0–52.0)
HEMOGLOBIN: 9 g/dL — AB (ref 13.0–17.0)
Hemoglobin: 8.2 g/dL — ABNORMAL LOW (ref 13.0–17.0)
MCH: 26.6 pg (ref 26.0–34.0)
MCH: 26.4 pg (ref 26.0–34.0)
MCHC: 29.8 g/dL — ABNORMAL LOW (ref 30.0–36.0)
MCHC: 29.5 g/dL — ABNORMAL LOW (ref 30.0–36.0)
MCV: 89.3 fL (ref 78.0–100.0)
MCV: 89.4 fL (ref 78.0–100.0)
PLATELETS: 386 10*3/uL (ref 150–400)
Platelets: 456 10*3/uL — ABNORMAL HIGH (ref 150–400)
RBC: 3.08 MIL/uL — AB (ref 4.22–5.81)
RBC: 3.41 MIL/uL — AB (ref 4.22–5.81)
RDW: 17.8 % — AB (ref 11.5–15.5)
RDW: 17.9 % — ABNORMAL HIGH (ref 11.5–15.5)
WBC: 11.8 10*3/uL — ABNORMAL HIGH (ref 4.0–10.5)
WBC: 13.7 10*3/uL — ABNORMAL HIGH (ref 4.0–10.5)

## 2016-08-31 LAB — BASIC METABOLIC PANEL
Anion gap: 9 (ref 5–15)
BUN: 38 mg/dL — AB (ref 6–20)
CALCIUM: 8.7 mg/dL — AB (ref 8.9–10.3)
CO2: 30 mmol/L (ref 22–32)
CREATININE: 1.75 mg/dL — AB (ref 0.61–1.24)
Chloride: 102 mmol/L (ref 101–111)
GFR calc Af Amer: 41 mL/min — ABNORMAL LOW (ref >60)
GFR, EST NON AFRICAN AMERICAN: 35 mL/min — AB (ref >60)
GLUCOSE: 121 mg/dL — AB (ref 65–99)
POTASSIUM: 3.4 mmol/L — AB (ref 3.5–5.1)
SODIUM: 141 mmol/L (ref 135–145)

## 2016-08-31 LAB — CREATININE, SERUM
CREATININE: 1.64 mg/dL — AB (ref 0.61–1.24)
GFR calc Af Amer: 44 mL/min — ABNORMAL LOW (ref >60)
GFR, EST NON AFRICAN AMERICAN: 38 mL/min — AB (ref >60)

## 2016-08-31 LAB — I-STAT TROPONIN, ED: Troponin i, poc: 0.01 ng/mL (ref 0.00–0.08)

## 2016-08-31 LAB — GLUCOSE, CAPILLARY: GLUCOSE-CAPILLARY: 148 mg/dL — AB (ref 65–99)

## 2016-08-31 LAB — POC OCCULT BLOOD, ED: Fecal Occult Bld: NEGATIVE

## 2016-08-31 LAB — MAGNESIUM: MAGNESIUM: 1.9 mg/dL (ref 1.7–2.4)

## 2016-08-31 LAB — BRAIN NATRIURETIC PEPTIDE: B NATRIURETIC PEPTIDE 5: 2085.6 pg/mL — AB (ref 0.0–100.0)

## 2016-08-31 MED ORDER — ONDANSETRON HCL 4 MG/2ML IJ SOLN: 4.0000 mg | Freq: Four times a day (QID) | INTRAMUSCULAR | Status: DC | PRN

## 2016-08-31 MED ORDER — TRAZODONE HCL 100 MG PO TABS
100.0000 mg | ORAL_TABLET | Freq: Every day | ORAL | Status: DC
  Administered 2016-08-31: 100 mg via ORAL
  Filled 2016-08-31: qty 1

## 2016-08-31 MED ORDER — FENOFIBRATE 54 MG PO TABS
54.0000 mg | ORAL_TABLET | Freq: Every day | ORAL | Status: DC
  Administered 2016-08-31 – 2016-09-02 (×3): 54 mg via ORAL
  Filled 2016-08-31 (×3): qty 1

## 2016-08-31 MED ORDER — VITAMIN D 1000 UNITS PO TABS
1000.0000 [IU] | ORAL_TABLET | Freq: Every day | ORAL | Status: DC
  Administered 2016-08-31 – 2016-09-02 (×3): 1000 [IU] via ORAL
  Filled 2016-08-31 (×3): qty 1

## 2016-08-31 MED ORDER — ACETAMINOPHEN 325 MG PO TABS: 650.0000 mg | ORAL_TABLET | ORAL | Status: DC | PRN

## 2016-08-31 MED ORDER — SODIUM CHLORIDE 0.9% FLUSH
3.0000 mL | Freq: Two times a day (BID) | INTRAVENOUS | Status: DC
  Administered 2016-08-31 – 2016-09-02 (×4): 3 mL via INTRAVENOUS

## 2016-08-31 MED ORDER — FUROSEMIDE 10 MG/ML IJ SOLN
40.0000 mg | Freq: Two times a day (BID) | INTRAMUSCULAR | Status: DC
  Administered 2016-09-01 – 2016-09-02 (×4): 40 mg via INTRAVENOUS
  Filled 2016-08-31 (×4): qty 4

## 2016-08-31 MED ORDER — FUROSEMIDE 10 MG/ML IJ SOLN
80.0000 mg | Freq: Once | INTRAMUSCULAR | Status: AC
  Administered 2016-08-31: 80 mg via INTRAVENOUS
  Filled 2016-08-31: qty 8

## 2016-08-31 MED ORDER — INSULIN ASPART 100 UNIT/ML ~~LOC~~ SOLN
0.0000 [IU] | Freq: Three times a day (TID) | SUBCUTANEOUS | Status: DC
  Administered 2016-09-01: 1 [IU] via SUBCUTANEOUS
  Administered 2016-09-01: 2 [IU] via SUBCUTANEOUS
  Administered 2016-09-02 (×2): 1 [IU] via SUBCUTANEOUS

## 2016-08-31 MED ORDER — VITAMIN C 500 MG PO TABS
500.0000 mg | ORAL_TABLET | Freq: Every day | ORAL | Status: DC
  Administered 2016-08-31 – 2016-09-02 (×3): 500 mg via ORAL
  Filled 2016-08-31 (×3): qty 1

## 2016-08-31 MED ORDER — INSULIN GLARGINE 100 UNIT/ML ~~LOC~~ SOLN
30.0000 [IU] | Freq: Every day | SUBCUTANEOUS | Status: DC
  Administered 2016-08-31 – 2016-09-02 (×2): 30 [IU] via SUBCUTANEOUS
  Filled 2016-08-31 (×3): qty 0.3

## 2016-08-31 MED ORDER — DIPHENHYDRAMINE-APAP (SLEEP) 25-500 MG PO TABS: 2.0000 | ORAL_TABLET | Freq: Every day | ORAL | Status: DC

## 2016-08-31 MED ORDER — ATORVASTATIN CALCIUM 80 MG PO TABS
80.0000 mg | ORAL_TABLET | Freq: Every day | ORAL | Status: DC
  Administered 2016-08-31 – 2016-09-01 (×2): 80 mg via ORAL
  Filled 2016-08-31 (×3): qty 1

## 2016-08-31 MED ORDER — METOPROLOL SUCCINATE ER 50 MG PO TB24
50.0000 mg | ORAL_TABLET | Freq: Every day | ORAL | Status: DC
  Administered 2016-08-31 – 2016-09-01 (×2): 50 mg via ORAL
  Filled 2016-08-31 (×3): qty 1

## 2016-08-31 MED ORDER — SODIUM CHLORIDE 0.9% FLUSH: 3.0000 mL | INTRAVENOUS | Status: DC | PRN

## 2016-08-31 MED ORDER — POTASSIUM CHLORIDE CRYS ER 20 MEQ PO TBCR
40.0000 meq | EXTENDED_RELEASE_TABLET | Freq: Once | ORAL | Status: AC
  Administered 2016-08-31: 40 meq via ORAL
  Filled 2016-08-31: qty 2

## 2016-08-31 MED ORDER — INSULIN ASPART 100 UNIT/ML ~~LOC~~ SOLN: 0.0000 [IU] | Freq: Every day | SUBCUTANEOUS | Status: DC

## 2016-08-31 MED ORDER — PANTOPRAZOLE SODIUM 40 MG PO TBEC
40.0000 mg | DELAYED_RELEASE_TABLET | Freq: Every day | ORAL | Status: DC
  Administered 2016-09-01 – 2016-09-02 (×2): 40 mg via ORAL
  Filled 2016-08-31 (×2): qty 1

## 2016-08-31 MED ORDER — TRAMADOL HCL 50 MG PO TABS
50.0000 mg | ORAL_TABLET | Freq: Four times a day (QID) | ORAL | Status: DC | PRN
  Administered 2016-09-01: 50 mg via ORAL
  Filled 2016-08-31: qty 1

## 2016-08-31 MED ORDER — SODIUM CHLORIDE 0.9 % IV SOLN: 250.0000 mL | INTRAVENOUS | Status: DC | PRN

## 2016-08-31 NOTE — ED Notes (Signed)
M[place] applied via Juanda Crumble EMT.

## 2016-08-31 NOTE — ED Provider Notes (Signed)
[facility] DEPT Provider Note   CSN: [id number] Arrival date & time: 08/31/16  1220     History   Chief Complaint Chief Complaint  Patient presents with  . Weakness    HPI Mark Rivers is a 82 y.o. male who presents with generalized weakness. Past medical history significant for CAD, A. Fib not on Coumadin, CKD stage III, CHF, history of GI bleed with multiple blood transfusions, esophageal cancer, hypertension, diabetes. He states that he went to the bathroom this morning and became acutely dizzy, fell, and "bumped" his head on the floor. He couldn't get up because he felt too weak. He was on the floor for about 45 minutes. He endorses worsening shortness of breath from baseline with productive cough. He also has worsening lower leg swelling but has been adherent to his Lasix. He denies fever, chills, headache, wheezing, abdominal pain, nausea, vomiting, diarrhea. He states that he is concerned because he went to his doctor's office yesterday and his hemoglobin was 8.9. He denies blood in the stool. He denies fever, current dizziness, headache, neck pain, chest pain, abdominal pain, N/V, decreased urine. He weighs himself daily and is up a couple pounds. Lasix was increased to 1.5 BID on 8/16 when he saw cardiology. Echo from Jan 2018 shows EF 55-60%. He states he has been eating a lot of rotisserie chicken from [employer].  HPI  Past Medical History:  Diagnosis Date  . Allergy   . Back pain   . Chronic diastolic CHF (congestive heart failure) ([place]) 12/21/2015   A. Echo 10/17: Moderate LVH, EF 55-60, normal wall motion, grade 2 diastolic dysfunction, mild aortic stenosis (mean 14, peak 34), MAC, mild LAE    . Chronic respiratory failure ([place])   . CKD (chronic kidney disease) stage 3, GFR 30-59 ml/min 03/04/2014  . Coronary artery disease    a. s/p remote POBA in 1988 // b. NSTEMI in 10/17 >> LHC with 99% LM stenosis >> emergent CABG (L-LAD, S-OM2, S-PDA) - post op course  complicated (prolonged ventilation, s/p Trach, AFib, anemia req transfusion, bacteremia >> DC to LTAC)  . Diabetes mellitus   . Difficult intubation    difficult airway/FYI note 11/01/2015  . Esophageal adenocarcinoma ([place])    a. found by EGD 03/2016..  . GERD (gastroesophageal reflux disease)   . GI bleed 01/2016   a. 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 F/u EGD with esophageal adenocarcinoma 03/2016.  [name] History of nuclear stress test    a. Myoview 6/14 - EF 51, no ischemia or scar; Low Risk  . Hyperlipemia   . Hypertension   . Methicillin resistant Staphylococcus epidermidis infection 11/2015   a. during adm for CABG.  . Neuropathy   . On home oxygen therapy    "2L; 24/7" (02/09/2016)  . Persistent atrial fibrillation ([place]) 10/25/2015  . Sinus bradycardia    a. Prior HR 54 when in sinus rhythm 11/2015.  . Stroke [facility])    a. old L pontine infarcts seen on CT 04/2016.    Patient Active Problem List   Diagnosis Date Noted  . Hypoxemia 06/21/2016  . Physical deconditioning 06/21/2016  . Hypokalemia 05/08/2016  . Vertigo   . Acute on chronic respiratory failure ([place]) 05/07/2016  . 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 Malignant neoplasm of stomach ([place]) 02/22/2016  . Upper GI bleed   .  Duodenitis determined by biopsy   . Panlobular emphysema ([place])   . Essential hypertension   . Controlled diabetes mellitus type 2 with complications ([place])   . Influenza A 02/11/2016  . Duodenitis:Per EGD 02/11/2016 02/11/2016  . Warfarin-induced coagulopathy ([place])   . Malnutrition of moderate degree 02/10/2016  . Symptomatic anemia 02/09/2016  . GI bleed 02/09/2016  . Dyspnea on exertion 01/09/2016  . Monitoring for long-term anticoagulant use 12/24/2015  . Acute on chronic diastolic heart failure ([place]) 12/21/2015  . Pressure  injury of skin 11/17/2015  . Surgery, elective   . Coronary artery disease involving native coronary artery of native heart without angina pectoris 10/27/2015  . S/P CABG x 3   . History of MI (myocardial infarction)   . Persistent atrial fibrillation ([place]) 10/25/2015  . Seasonal allergic conjunctivitis 08/25/2015  . History of tobacco abuse 08/25/2015  . Diabetes mellitus ([place]) 03/11/2015  . Diabetes mellitus with stage 3 chronic kidney disease ([place]) 03/11/2015  . Metabolic bone disease 16/01/930  . Vitamin D deficiency 03/11/2015  . Atopic dermatitis 02/24/2015  . Hip pain 09/24/2014  . Need for immunization against influenza 09/24/2014  . Benign essential hypertension 03/13/2014  . Generalized osteoarthritis of multiple sites 03/13/2014  . CKD (chronic kidney disease) stage 3, GFR 30-59 ml/min 03/04/2014  . Acute stress disorder 11/12/2013  . Seasonal and perennial allergic rhinitis 11/12/2013  . Allergic urticaria 11/12/2013  . Benign neoplasm of colon 11/12/2013  . Causalgia of upper extremity 11/12/2013  . Cervical radiculopathy 11/12/2013  . Neck pain 11/12/2013  . Chronic fatigue 11/12/2013  . Hearing loss 11/12/2013  . Insomnia 11/12/2013  . Obesity 11/12/2013  . Premature ventricular contractions 11/12/2013  . Psychosexual dysfunction with inhibited sexual excitement 11/12/2013  . Hyperlipemia 09/11/2013  . Type 2 diabetes mellitus, uncontrolled ([place]) 09/11/2013  . Pulmonary emphysema ([place]) 04/22/2013  . Esophageal reflux   . Benign hypertension with chronic kidney disease, stage III   . Allergy   . Neuropathy [facility])     Past Surgical History:  Procedure Laterality Date  . CARDIAC CATHETERIZATION N/A 10/26/2015   Procedure: Left Heart Cath and Coronary Angiography;  Surgeon: Troy Sine, MD;  Location: [facility] CV [facility];  Service: Cardiovascular;  Laterality: N/A;  . CATARACT EXTRACTION W/ INTRAOCULAR LENS  IMPLANT, BILATERAL  2008  . COLONOSCOPY    .  CORONARY ANGIOPLASTY WITH STENT PLACEMENT  1988  . CORONARY ARTERY BYPASS GRAFT N/A 10/26/2015   Procedure: CORONARY ARTERY BYPASS GRAFTING (CABG) x3(LIMA to LA, SVG to OM1, SVG to PDA) with EVH from the left thigh and partial lower leg greater saphenous vein and left internal mammary artery;  Surgeon: Ivin Poot, MD;  Location: [facility];  Service: Open Heart Surgery;  Laterality: N/A;  . ESOPHAGOGASTRODUODENOSCOPY (EGD) WITH PROPOFOL N/A 02/11/2016   Procedure: ESOPHAGOGASTRODUODENOSCOPY (EGD) WITH PROPOFOL;  Surgeon: Ladene Artist, MD;  Location: [facility] ENDOSCOPY;  Service: Endoscopy;  Laterality: N/A;  . EUS N/A 03/09/2016   Procedure: UPPER ENDOSCOPIC ULTRASOUND (EUS) RADIAL;  Surgeon: Milus Banister, MD;  Location: [facility] ENDOSCOPY;  Service: Endoscopy;  Laterality: N/A;  . TEE WITHOUT CARDIOVERSION N/A 10/26/2015   Procedure: TRANSESOPHAGEAL ECHOCARDIOGRAM (TEE);  Surgeon: Ivin Poot, MD;  Location: [facility];  Service: Open Heart Surgery;  Laterality: N/A;  . TONSILLECTOMY         Home Medications    Prior to Admission medications   Medication Sig Start Date End Date Taking? Authorizing Provider  aspirin EC 81  MG tablet Take 81 mg by mouth daily.    [provider]  atorvastatin (LIPITOR) 80 MG tablet Take 1 tablet (80 mg total) by mouth daily. 03/31/16   Josue Hector, MD  cholecalciferol (VITAMIN D) 1000 units tablet Take 1,000 Units by mouth daily.    [provider]  diphenhydramine-acetaminophen (TYLENOL PM) 25-500 MG TABS tablet Take 2 tablets by mouth at bedtime.    [provider]  fenofibrate 54 MG tablet Take 54 mg by mouth daily.    [provider]  fluticasone furoate-vilanterol (BREO ELLIPTA) 100-25 MCG/INH AEPB Inhale 1 puff into the lungs daily. 07/20/16   Magdalen Spatz, NP  furosemide (LASIX) 40 MG tablet Take 1.5 tablets (60 mg total) by mouth 2 (two) times daily. 08/24/16 08/24/17  Josue Hector, MD  Glucosamine-Chondroit-Vit C-Mn  (GLUCOSAMINE CHONDR 1500 COMPLX PO) Take 2 tablets by mouth daily.     [provider]  Insulin Glargine (TOUJEO SOLOSTAR) 300 UNIT/ML SOPN Inject 30 Units into the skin at bedtime.    [provider]  loratadine (CLARITIN) 10 MG tablet Take 1 tablet (10 mg total) by mouth daily. 02/14/16   Allie Bossier, MD  metoprolol succinate (TOPROL-XL) 50 MG 24 hr tablet Take 1 tablet (50 mg total) by mouth daily. Take with or immediately following a meal. 02/16/16 08/18/16  Richardson Dopp T, PA-C  OXYGEN Inhale 2 L/min into the lungs continuous.    [provider]  pantoprazole (PROTONIX) 40 MG tablet Take 1 tablet (40 mg total) by mouth daily at 6 (six) AM. 02/14/16   Allie Bossier, MD  potassium chloride SA (K-DUR,KLOR-CON) 20 MEQ tablet Take 2 tablets (40 mEq total) by mouth daily. 06/14/16   Josue Hector, MD  traMADol (ULTRAM) 50 MG tablet Take by mouth every 6 (six) hours as needed.    [provider]  traZODone (DESYREL) 100 MG tablet Take 100 mg by mouth at bedtime.     [provider]  vitamin C (ASCORBIC ACID) 500 MG tablet Take 500 mg by mouth daily.    [provider]    Family History Family History  Problem Relation Age of Onset  . Diabetes Father   . Colon cancer Neg Hx   . Esophageal cancer Neg Hx   . Stomach cancer Neg Hx   . Rectal cancer Neg Hx   . Allergic rhinitis Neg Hx   . Angioedema Neg Hx   . Asthma Neg Hx   . Eczema Neg Hx   . Immunodeficiency Neg Hx   . Urticaria Neg Hx   . Liver disease Neg Hx     Social History Social History  Substance Use Topics  . Smoking status: Former Smoker    Packs/day: 2.00    Years: 30.00    Types: Cigarettes    Quit date: 06/28/2004  . Smokeless tobacco: Never Used  . Alcohol use No     Comment: QUIT 06/28/2004     Allergies   Amoxicillin; Cephalexin; and Penicillin g   Review of Systems Review of Systems  Constitutional: Negative for appetite change, chills and fever.    Eyes: Negative for visual disturbance.  Respiratory: Positive for cough and shortness of breath. Negative for wheezing.   Cardiovascular: Positive for leg swelling. Negative for chest pain.  Gastrointestinal: Negative for abdominal pain, blood in stool, diarrhea, nausea and vomiting.  Genitourinary: Negative for difficulty urinating, dysuria and hematuria.  Musculoskeletal: Positive for arthralgias (right shoulder). Negative  for neck pain.  Skin: Negative for wound.  Neurological: Positive for dizziness (resolved) and weakness. Negative for syncope and headaches.  All other systems reviewed and are negative.    Physical Exam Updated Vital Signs BP 138/80 (BP Location: Left Arm)   Pulse (!) 106   Temp 97.8 F (36.6 C) (Oral)   Resp (!) 21   Ht _0  (1.778 m)   Wt 90.7 kg (200 lb)   SpO2 100%   BMI 28.70 kg/m   Physical Exam  Constitutional: He is oriented to person, place, and time. He appears well-developed and well-nourished. No distress.  HENT:  Head: Normocephalic and atraumatic.  No obvious head trauma  Eyes: Pupils are equal, round, and reactive to light. Conjunctivae are normal. Right eye exhibits no discharge. Left eye exhibits no discharge. No scleral icterus.  Neck: Normal range of motion.  Cardiovascular: An irregularly irregular rhythm present. Tachycardia present.   HR is between 90-110  Pulmonary/Chest: Effort normal. No respiratory distress. He has decreased breath sounds. He has no wheezes. He has no rales. He exhibits no tenderness.  Occasional cough   Abdominal: Soft. Bowel sounds are normal. He exhibits no distension and no mass. There is no tenderness. There is no rebound and no guarding. No hernia.  Musculoskeletal:  2+ pitting edema bilaterally   Neurological: He is alert and oriented to person, place, and time.  Skin: Skin is warm and dry.  Psychiatric: He has a normal mood and affect. His behavior is normal.  Nursing note and vitals  reviewed.    ED Treatments / Results  Labs (all labs ordered are listed, but only abnormal results are displayed) Labs Reviewed  CBC - Abnormal; Notable for the following:       Result Value   WBC 11.8 (*)    RBC 3.08 (*)    Hemoglobin 8.2 (*)    HCT 27.5 (*)    MCHC 29.8 (*)    RDW 17.8 (*)    All other components within normal limits  BASIC METABOLIC PANEL - Abnormal; Notable for the following:    Potassium 3.4 (*)    Glucose, Bld 121 (*)    BUN 38 (*)    Creatinine, Ser 1.75 (*)    Calcium 8.7 (*)    GFR calc non Af Amer 35 (*)    GFR calc Af Amer 41 (*)    All other components within normal limits  BRAIN NATRIURETIC PEPTIDE - Abnormal; Notable for the following:    B Natriuretic Peptide 2,085.6 (*)    All other components within normal limits  I-STAT TROPONIN, ED  POC OCCULT BLOOD, ED  TYPE AND SCREEN    EKG  EKG Interpretation  Date/Time:  Thursday August 31 2016 12:23:26 EDT Ventricular Rate:  112 PR Interval:    QRS Duration: 94 QT Interval:  358 QTC Calculation: 488 R Axis:   91 Text Interpretation:  Atrial fibrillation with rapid ventricular response with premature ventricular or aberrantly conducted complexes Rightward axis Possible Anterior infarct , age undetermined Abnormal ECG similar to previous Confirmed by Theotis Burrow [phone]) on 08/31/2016 3:01:50 PM       Radiology Dg Chest 2 View  Result Date: 08/31/2016 CLINICAL DATA:  Shortness of breath and cough EXAM: CHEST  2 VIEW COMPARISON:  08/30/2016 FINDINGS: Chronic cardiomegaly. Status post CABG. Stable mediastinal contours. Trace pleural effusions. Pulmonary vascular congestion. Hyperinflation, probable COPD. IMPRESSION: 1. Cardiomegaly with trace effusions and pulmonary vascular congestion. 2. Hyperinflation suggesting COPD. Electronically  Signed   By: Monte Fantasia M.D.   On: 08/31/2016 13:32    Procedures Procedures (including critical care time)  Medications Ordered in ED Medications   furosemide (LASIX) injection 80 mg (not administered)     Initial Impression / Assessment and Plan / ED Course  I have reviewed the triage vital signs and the nursing notes.  Pertinent labs & imaging results that were available during my care of the patient were reviewed by me and considered in my medical decision making (see chart for details).  82 year old male with episode of dizziness this morning causing a fall and head injury. Likely multifactorial due to anemia and hypoxia. His HR is between 90-110 resting but otherwise is normal on 3L of O2. He does appear to be mildly volume overloaded on exam. CBC remarkable for leukocytosis of 11.8 and hgb of 8.2 which is slightly lower than baseline. Hemoccult was done which was negative. BMp shows mild hypokalemia, mild hyperglycemia, and SCr of 1.75 which is slightly higher than baseline likely due to increased Lasix dosage. Troponin is normal. EKG shows A.fib with RVR. Although he is not hypoxic he has failed outpatient therapy and is very dyspenic while ambulatory. Will admit to hospitalist for diuresis. IV lasix ordered. Spoke to Dr. Ree Kida who will admit.  Final Clinical Impressions(s) / ED Diagnoses   Final diagnoses:  Acute on chronic congestive heart failure, unspecified heart failure type [facility])  Generalized weakness    New Prescriptions New Prescriptions   No medications on file     Iris Pert 09/01/16 0349    Little, Wenda [place], MD 09/02/16 [phone]

## 2016-08-31 NOTE — ED Triage Notes (Signed)
Per EMS: Pt has been feeling weak x 2 days.  Pt went to MD yesterday and had blood drawn.  Pt was called by PCP today and told he was borderline anemic.  Pt think HGB was 9.7.  Pt A&Ox4 and VSS.  Pt has extensive Cardiac Hx.  Pt also had fall this morning but is c/o no pain. No Hx of recent falls.  CBG 151. Pt on home O2 of 3L [place].

## 2016-08-31 NOTE — ED Notes (Signed)
Baseline weight is 202lbs

## 2016-08-31 NOTE — H&P (Signed)
[facility] History and Physical  Dara Camargo ZOX:[id number] DOB: Jan 25, 1934 DOA: 08/31/2016  PCP: [name], MD  Patient coming from: Home  Chief Complaint: Shortness of breath and weakness  HPI: Mark Rivers is a 82 y.o. male with a medical history of chronic diastolic heart failure, coronary artery disease, on chronic hypoxia on 3 L, esophageal adenocarcinoma recently diagnosed, who presented to the emergency department with complaints of shortness of breath and weakness. Patient states he fell earlier today and was unable to get up. He recently has gained a few pounds in the last couple of weeks. Patient followed up with his cardiologist, his Lasix was increased to 60 mg twice a day. Patient states he has been taking his Lasix as prescribed and trying to watch his diet and oral fluid intake. He has been having worsening shortness of breath with productive cough and increased leg swelling. Patient states his dry weight is approximately 202 pounds. Currently he denies any chest pain, abdominal pain, nausea or vomiting, diarrhea or constipation, changes in urinary habits, recent travel or ill contacts, dizziness or headache.  ED Course: Found to have acute CHF. Given IV Lasix 80 mg. TRH called for admission.  Review of Systems:  All other systems reviewed and are negative.   Past Medical History:  Diagnosis Date  . Allergy   . Back pain   . Chronic diastolic CHF (congestive heart failure) ([place]) 12/21/2015   A. Echo 10/17: Moderate LVH, EF 55-60, normal wall motion, grade 2 diastolic dysfunction, mild aortic stenosis (mean 14, peak 34), MAC, mild LAE    . Chronic respiratory failure ([place])   . CKD (chronic kidney disease) stage 3, GFR 30-59 ml/min 03/04/2014  . Coronary artery disease    a. s/p remote POBA in 1988 // b. NSTEMI in 10/17 >> LHC with 99% LM stenosis >> emergent CABG (L-LAD, S-OM2, S-PDA) - post op course complicated (prolonged ventilation, s/p Trach, AFib,  anemia req transfusion, bacteremia >> DC to LTAC)  . Diabetes mellitus   . Difficult intubation    difficult airway/FYI note 11/01/2015  . Esophageal adenocarcinoma ([place])    a. found by EGD 03/2016..  . GERD (gastroesophageal reflux disease)   . GI bleed 01/2016   a. 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 F/u EGD with esophageal adenocarcinoma 03/2016.  [name] History of nuclear stress test    a. Myoview 6/14 - EF 51, no ischemia or scar; Low Risk  . Hyperlipemia   . Hypertension   . Methicillin resistant Staphylococcus epidermidis infection 11/2015   a. during adm for CABG.  . Neuropathy   . On home oxygen therapy    "2L; 24/7" (02/09/2016)  . Persistent atrial fibrillation ([place]) 10/25/2015  . Sinus bradycardia    a. Prior HR 54 when in sinus rhythm 11/2015.  . Stroke [facility])    a. old L pontine infarcts seen on CT 04/2016.    Past Surgical History:  Procedure Laterality Date  . CARDIAC CATHETERIZATION N/A 10/26/2015   Procedure: Left Heart Cath and Coronary Angiography;  Surgeon: Troy Sine, MD;  Location: [place] CV LAB;  Service: Cardiovascular;  Laterality: N/A;  . CATARACT EXTRACTION W/ INTRAOCULAR LENS  IMPLANT, BILATERAL  2008  . COLONOSCOPY    . CORONARY ANGIOPLASTY WITH STENT PLACEMENT  1988  . CORONARY ARTERY BYPASS GRAFT N/A 10/26/2015  Procedure: CORONARY ARTERY BYPASS GRAFTING (CABG) x3(LIMA to LA, SVG to OM1, SVG to PDA) with EVH from the left thigh and partial lower leg greater saphenous vein and left internal mammary artery;  Surgeon: Ivin Poot, MD;  Location: [facility];  Service: Open Heart Surgery;  Laterality: N/A;  . ESOPHAGOGASTRODUODENOSCOPY (EGD) WITH PROPOFOL N/A 02/11/2016   Procedure: ESOPHAGOGASTRODUODENOSCOPY (EGD) WITH PROPOFOL;  Surgeon: Ladene Artist, MD;  Location: [facility]  ENDOSCOPY;  Service: Endoscopy;  Laterality: N/A;  . EUS N/A 03/09/2016   Procedure: UPPER ENDOSCOPIC ULTRASOUND (EUS) RADIAL;  Surgeon: Milus Banister, MD;  Location: [facility] ENDOSCOPY;  Service: Endoscopy;  Laterality: N/A;  . TEE WITHOUT CARDIOVERSION N/A 10/26/2015   Procedure: TRANSESOPHAGEAL ECHOCARDIOGRAM (TEE);  Surgeon: Ivin Poot, MD;  Location: [place];  Service: Open Heart Surgery;  Laterality: N/A;  . TONSILLECTOMY      Social History:  reports that he quit smoking about 12 years ago. His smoking use included Cigarettes. He has a 60.00 pack-year smoking history. He has never used smokeless tobacco. He reports that he does not drink alcohol or use drugs.  Allergies  Allergen Reactions  . Amoxicillin Other (See Comments)    PATIENT PASSED OUT   . Cephalexin Other (See Comments)    PATIENT PASSED OUT  . Penicillin G Other (See Comments)    PATIENT PASSED OUT. Has patient had a PCN reaction causing immediate rash, facial/tongue/throat swelling, SOB or lightheadedness with hypotension: Yes Has patient had a PCN reaction causing severe rash involving mucus membranes or skin necrosis: No Has patient had a PCN reaction that required hospitalization: Yes Has patient had a PCN reaction occurring within the last 10 years: Yes If all of the above answers are "NO", then may proceed with Cephalosporin    Family History  Problem Relation Age of Onset  . Diabetes Father   . Colon cancer Neg Hx   . Esophageal cancer Neg Hx   . Stomach cancer Neg Hx   . Rectal cancer Neg Hx   . Allergic rhinitis Neg Hx   . Angioedema Neg Hx   . Asthma Neg Hx   . Eczema Neg Hx   . Immunodeficiency Neg Hx   . Urticaria Neg Hx   . Liver disease Neg Hx      Prior to Admission medications   Medication Sig Start Date End Date Taking? Authorizing Provider  aspirin EC 81 MG tablet Take 81 mg by mouth daily.   Yes [provider]  atorvastatin (LIPITOR) 80 MG tablet Take 1 tablet (80 mg total) by  mouth daily. 03/31/16  Yes Josue Hector, MD  cholecalciferol (VITAMIN D) 1000 units tablet Take 1,000 Units by mouth daily.   Yes [provider]  diphenhydramine-acetaminophen (TYLENOL PM) 25-500 MG TABS tablet Take 2 tablets by mouth at bedtime.   Yes [provider]  fenofibrate 54 MG tablet Take 54 mg by mouth daily.   Yes [provider]  furosemide (LASIX) 40 MG tablet Take 1.5 tablets (60 mg total) by mouth 2 (two) times daily. 08/24/16 08/24/17 Yes Josue Hector, MD  Glucosamine-Chondroit-Vit C-Mn (GLUCOSAMINE CHONDR 1500 COMPLX PO) Take 2 tablets by mouth daily.    Yes [provider]  Insulin Glargine (TOUJEO SOLOSTAR) 300 UNIT/ML SOPN Inject 30 Units into the skin at bedtime.   Yes [provider]  metoprolol succinate (TOPROL-XL) 50 MG 24 hr tablet Take 1 tablet (50 mg total) by mouth daily. Take with or immediately following  a meal. 02/16/16 08/31/16 Yes Weaver, Scott T, PA-C  OXYGEN Inhale 2 L/min into the lungs continuous.   Yes [provider]  pantoprazole (PROTONIX) 40 MG tablet Take 1 tablet (40 mg total) by mouth daily at 6 (six) AM. 02/14/16  Yes Allie Bossier, MD  traMADol (ULTRAM) 50 MG tablet Take by mouth every 6 (six) hours as needed.   Yes [provider]  traZODone (DESYREL) 100 MG tablet Take 100 mg by mouth at bedtime.    Yes [provider]  vitamin C (ASCORBIC ACID) 500 MG tablet Take 500 mg by mouth daily.   Yes [provider]    Physical Exam: Vitals:   08/31/16 1500 08/31/16 1700  BP: 137/78 (!) 160/78  Pulse: 81 67  Resp: (!) 23 (!) 21  Temp:    SpO2: 100% 100%     General: Well developed, well nourished, NAD, appears stated age  HEENT: NCAT, PERRLA, EOMI, Anicteic Sclera, mucous membranes moist.   Neck: Supple, no masses  Cardiovascular: S1 S2 auscultated, 1/6 SEM, Irregularly irregular  Respiratory: Diminished breath sounds, however clear  Abdomen: Soft,  nontender, nondistended, + bowel sounds  Extremities: warm dry without cyanosis clubbing. 2+LE edema B/L   Neuro: AAOx3, cranial nerves grossly intact. Strength 5/5 in patient's upper and lower extremities bilaterally  Skin: Without rashes exudates or nodules  Psych: Normal affect and demeanor with intact judgement and insight  Labs on Admission: I have personally reviewed following labs and imaging studies CBC:  Recent Labs Lab 08/31/16 1320  WBC 11.8*  HGB 8.2*  HCT 27.5*  MCV 89.3  PLT 469   Basic Metabolic Panel:  Recent Labs Lab 08/31/16 1320  NA 141  K 3.4*  CL 102  CO2 30  GLUCOSE 121*  BUN 38*  CREATININE 1.75*  CALCIUM 8.7*   GFR: Estimated Creatinine Clearance: 38.1 mL/min (A) (by C-G formula based on SCr of 1.75 mg/dL (H)). Liver Function Tests: No results for input(s): AST, ALT, ALKPHOS, BILITOT, PROT, ALBUMIN in the last 168 hours. No results for input(s): LIPASE, AMYLASE in the last 168 hours. No results for input(s): AMMONIA in the last 168 hours. Coagulation Profile: No results for input(s): INR, PROTIME in the last 168 hours. Cardiac Enzymes: No results for input(s): CKTOTAL, CKMB, CKMBINDEX, TROPONINI in the last 168 hours. BNP (last 3 results) No results for input(s): PROBNP in the last 8760 hours. HbA1C: No results for input(s): HGBA1C in the last 72 hours. CBG: No results for input(s): GLUCAP in the last 168 hours. Lipid Profile: No results for input(s): CHOL, HDL, LDLCALC, TRIG, CHOLHDL, LDLDIRECT in the last 72 hours. Thyroid Function Tests: No results for input(s): TSH, T4TOTAL, FREET4, T3FREE, THYROIDAB in the last 72 hours. Anemia Panel: No results for input(s): VITAMINB12, FOLATE, FERRITIN, TIBC, IRON, RETICCTPCT in the last 72 hours. Urine analysis:    Component Value Date/Time   COLORURINE YELLOW 02/09/2016 1700   APPEARANCEUR CLEAR 02/09/2016 1700   LABSPEC 1.012 02/09/2016 1700   PHURINE 5.0 02/09/2016 1700   GLUCOSEU  NEGATIVE 02/09/2016 1700   HGBUR NEGATIVE 02/09/2016 1700   BILIRUBINUR NEGATIVE 02/09/2016 1700   KETONESUR NEGATIVE 02/09/2016 1700   PROTEINUR NEGATIVE 02/09/2016 1700   UROBILINOGEN 0.2 10/26/2008 1315   NITRITE NEGATIVE 02/09/2016 1700   LEUKOCYTESUR NEGATIVE 02/09/2016 1700   Sepsis Labs: _0 (procalcitonin:4,lacticidven:4) )No results found for this or any previous visit (from the past 240 hour(s)).   Radiological Exams on Admission: Dg Chest 2 View  Result  Date: 08/31/2016 CLINICAL DATA:  Shortness of breath and cough EXAM: CHEST  2 VIEW COMPARISON:  08/30/2016 FINDINGS: Chronic cardiomegaly. Status post CABG. Stable mediastinal contours. Trace pleural effusions. Pulmonary vascular congestion. Hyperinflation, probable COPD. IMPRESSION: 1. Cardiomegaly with trace effusions and pulmonary vascular congestion. 2. Hyperinflation suggesting COPD. Electronically Signed   By: Monte Fantasia M.D.   On: 08/31/2016 13:32   Ct Head Wo Contrast  Result Date: 08/31/2016 CLINICAL DATA:  Head injury after fall today. EXAM: CT HEAD WITHOUT CONTRAST CT CERVICAL SPINE WITHOUT CONTRAST TECHNIQUE: Multidetector CT imaging of the head and cervical spine was performed following the standard protocol without intravenous contrast. Multiplanar CT image reconstructions of the cervical spine were also generated. COMPARISON:  CT scan of May 07, 2016. FINDINGS: CT HEAD FINDINGS Brain: Mild diffuse cortical atrophy is noted. Minimal chronic ischemic white matter disease is noted. No mass effect or midline shift is noted. Ventricular size is within normal limits. There is no evidence of mass lesion, hemorrhage or acute infarction. Vascular: No hyperdense vessel or unexpected calcification. Skull: Normal. Negative for fracture or focal lesion. Sinuses/Orbits: No acute finding. Other: None. CT CERVICAL SPINE FINDINGS Alignment: Grade 1 anterolisthesis of C4-5 is noted secondary to posterior facet joint  hypertrophy. Skull base and vertebrae: No acute fracture. No primary bone lesion or focal pathologic process. Soft tissues and spinal canal: No prevertebral fluid or swelling. No visible canal hematoma. Disc levels: Severe degenerative disc disease is noted at C5-6 and C6-7 with anterior osteophyte formation. Upper chest: Negative. Other: Degenerative changes seen involving the posterior facets bilaterally, but most prominently on the left. IMPRESSION: Mild diffuse cortical atrophy. Minimal chronic ischemic white matter disease. No acute intracranial abnormality seen. Multilevel degenerative disc disease. No acute abnormality seen in the cervical spine. Electronically Signed   By: Marijo Conception, M.D.   On: 08/31/2016 16:22   Ct Cervical Spine Wo Contrast  Result Date: 08/31/2016 CLINICAL DATA:  Head injury after fall today. EXAM: CT HEAD WITHOUT CONTRAST CT CERVICAL SPINE WITHOUT CONTRAST TECHNIQUE: Multidetector CT imaging of the head and cervical spine was performed following the standard protocol without intravenous contrast. Multiplanar CT image reconstructions of the cervical spine were also generated. COMPARISON:  CT scan of May 07, 2016. FINDINGS: CT HEAD FINDINGS Brain: Mild diffuse cortical atrophy is noted. Minimal chronic ischemic white matter disease is noted. No mass effect or midline shift is noted. Ventricular size is within normal limits. There is no evidence of mass lesion, hemorrhage or acute infarction. Vascular: No hyperdense vessel or unexpected calcification. Skull: Normal. Negative for fracture or focal lesion. Sinuses/Orbits: No acute finding. Other: None. CT CERVICAL SPINE FINDINGS Alignment: Grade 1 anterolisthesis of C4-5 is noted secondary to posterior facet joint hypertrophy. Skull base and vertebrae: No acute fracture. No primary bone lesion or focal pathologic process. Soft tissues and spinal canal: No prevertebral fluid or swelling. No visible canal hematoma. Disc levels:  Severe degenerative disc disease is noted at C5-6 and C6-7 with anterior osteophyte formation. Upper chest: Negative. Other: Degenerative changes seen involving the posterior facets bilaterally, but most prominently on the left. IMPRESSION: Mild diffuse cortical atrophy. Minimal chronic ischemic white matter disease. No acute intracranial abnormality seen. Multilevel degenerative disc disease. No acute abnormality seen in the cervical spine. Electronically Signed   By: Marijo Conception, M.D.   On: 08/31/2016 16:22    EKG: Independently reviewed. Atrial fibrillation, rate 112, PVC  Assessment/Plan  Acute diastolic heart failure exacerbation -Patient complaining of shortness  of breath as well as weight gain over the last couple of weeks. -Recently presented to his cardiologist office, Lasix was increased to 60 mg twice a day -Chest x-ray did show pulmonary vascular congestion with trace effusions -BNP 2085.6 -Will place patient on IV Lasix 40 mg twice a day -Monitor intake and output, daily weights -Last echocardiogram 06/06/2016 shows EF of 55-60%  Atrial fibrillation -Currently not on anticoagulation as patient has had history of GI bleed -Aspirin also discontinued per Cardiology note 08/24/2016 (Dr. Jenkins Rouge) -Patient was placed on amiodarone with plans for cardioversion in a few weeks. Currently do no see amiodarone on med list -Currently rate controlled -Continue metoprolol  Chronic kidney disease, stage III -Creatinine currently 1.75, baseline creatinine approximately 1.4-1.6 -Continue to monitor BMP closely as patient will be diuresing  Anemia of chronic disease -Hemoglobin 8.2, baseline appears to be approximately 9 -FOBT negative -Will continue to monitor CBC  Diabetes mellitus, type II -On Toujeo at home -Will continue glargine 30 units QHS, and place on an son signing scale CBG monitoring  Essential hypertension -Continue metoprolol and IV Lasix  Chronic  hypoxia/COPD -Currently no wheezing -Patient uses 3 L of home oxygen at baseline  Hyperlipidemia -Continue statin and fenofibrate  Hypokalemia -Likely secondary to diuresis -Will replace and continue to monitor BMP and obtain a magnesium level History of coronary artery disease -Currently no chest pain -Status post angioplasty in 1988; CABG in 2017  Generalized weakness/fall -Patient recently fell today and was unable to get up -CT head: No acute neuro cranial normality seen. Mild diffuse cortical atrophy -CT cervical spine showed no acute abnormality. Multilevel degenerative disc disease -Will consult PT, OT -Patient lives alone  History of esophageal adenocarcinoma -Patient had EGD 03/09/2016 with Dr. [name], showing adenocarcinoma. He was referred to [facility] for further intervention/treatment  DVT prophylaxis: SCDs  Code Status: Full  Family Communication: None at bedside. Admission, patients condition and plan of care including tests being ordered have been discussed with the patient, who indicates understanding and agrees with the plan and Code Status.  Disposition Plan: Admitted.   Consults called: None   Admission status: Inpatient (admitted for CHF exacerbation with failed increase in outpatient Lasix. Patient will require IV Lasix and close monitoring of intake and output as well as renal function. Patient also lives alone and found down at home unable to get up, will need PT and OT consultation.)  Time spent: 70 minutes  Lavra Imler D.O. [facility] Pager [phone]  If 7PM-7AM, please contact night-coverage [email or web address] Password [email or web address] 08/31/2016, 5:33 PM

## 2016-08-31 NOTE — ED Notes (Signed)
Pt keeps on removing EKG leads and has removed an IV.  This RN has asked Dr Rex Kras if we can apply mittens.

## 2016-09-01 ENCOUNTER — Telehealth: Payer: Self-pay | Admitting: Pulmonary Disease

## 2016-09-01 ENCOUNTER — Inpatient Hospital Stay (HOSPITAL_COMMUNITY): Payer: Medicare Other

## 2016-09-01 DIAGNOSIS — R531 Weakness: Secondary | ICD-10-CM

## 2016-09-01 DIAGNOSIS — R609 Edema, unspecified: Secondary | ICD-10-CM

## 2016-09-01 LAB — CBC
HEMATOCRIT: 28.6 % — AB (ref 39.0–52.0)
Hemoglobin: 8.4 g/dL — ABNORMAL LOW (ref 13.0–17.0)
MCH: 26.4 pg (ref 26.0–34.0)
MCHC: 29.4 g/dL — ABNORMAL LOW (ref 30.0–36.0)
MCV: 89.9 fL (ref 78.0–100.0)
Platelets: 413 10*3/uL — ABNORMAL HIGH (ref 150–400)
RBC: 3.18 MIL/uL — AB (ref 4.22–5.81)
RDW: 17.8 % — AB (ref 11.5–15.5)
WBC: 11.7 10*3/uL — AB (ref 4.0–10.5)

## 2016-09-01 LAB — BASIC METABOLIC PANEL
Anion gap: 9 (ref 5–15)
BUN: 31 mg/dL — AB (ref 6–20)
CHLORIDE: 101 mmol/L (ref 101–111)
CO2: 32 mmol/L (ref 22–32)
Calcium: 9 mg/dL (ref 8.9–10.3)
Creatinine, Ser: 1.48 mg/dL — ABNORMAL HIGH (ref 0.61–1.24)
GFR calc Af Amer: 50 mL/min — ABNORMAL LOW (ref >60)
GFR calc non Af Amer: 43 mL/min — ABNORMAL LOW (ref >60)
Glucose, Bld: 110 mg/dL — ABNORMAL HIGH (ref 65–99)
POTASSIUM: 3.4 mmol/L — AB (ref 3.5–5.1)
SODIUM: 142 mmol/L (ref 135–145)

## 2016-09-01 LAB — POTASSIUM: Potassium: 3.5 mmol/L (ref 3.5–5.1)

## 2016-09-01 LAB — MAGNESIUM: MAGNESIUM: 1.8 mg/dL (ref 1.7–2.4)

## 2016-09-01 LAB — GLUCOSE, CAPILLARY
GLUCOSE-CAPILLARY: 187 mg/dL — AB (ref 65–99)
GLUCOSE-CAPILLARY: 147 mg/dL — AB (ref 65–99)
Glucose-Capillary: 100 mg/dL — ABNORMAL HIGH (ref 65–99)
Glucose-Capillary: 142 mg/dL — ABNORMAL HIGH (ref 65–99)

## 2016-09-01 MED ORDER — POTASSIUM CHLORIDE CRYS ER 20 MEQ PO TBCR
40.0000 meq | EXTENDED_RELEASE_TABLET | Freq: Two times a day (BID) | ORAL | Status: AC
  Administered 2016-09-01 – 2016-09-02 (×2): 40 meq via ORAL
  Filled 2016-09-01 (×2): qty 2

## 2016-09-01 NOTE — Evaluation (Signed)
Physical Therapy Evaluation Patient Details Name: Mark Rivers MRN: [id number] DOB: 07-May-1934 Today's Date: 09/01/2016   History of Present Illness  Gayland Nicol is a 82 y.o. male with a medical history of chronic diastolic heart failure, coronary artery disease, on chronic hypoxia on 3 L, esophageal adenocarcinoma recently diagnosed, who presented to the emergency department with complaints of shortness of breath and weakness. Patient states he fell earlier today and was unable to get up.   Clinical Impression  Pt admitted with above diagnosis. Pt currently with functional limitations due to the deficits listed below (see PT Problem List). Pt ambulated well with good safety overall. Was on 3LO2 PTA and sats >92% on 3LO2 with activity today.  Will benefit from PT to address endurance.   Pt will benefit from skilled PT to increase their independence and safety with mobility to allow discharge to the venue listed below.      Follow Up Recommendations Home health PT;Supervision - Intermittent (safety eval)    Equipment Recommendations  None recommended by PT    Recommendations for Other Services       Precautions / Restrictions Precautions Precautions: None Restrictions Weight Bearing Restrictions: No      Mobility  Bed Mobility Overal bed mobility: Independent                Transfers Overall transfer level: Independent                  Ambulation/Gait Ambulation/Gait assistance: Supervision Ambulation Distance (Feet): 350 Feet Assistive device: Rolling walker (2 wheeled) Gait Pattern/deviations: Step-through pattern;Decreased stride length   Gait velocity interpretation: Below normal speed for age/gender General Gait Details: Pt was able to ambulate in hallway with 3LO2 with sats >92%.  Pt with DOE 2/4 with ambulation.  Good safety with RW.  Uses RW at home per pt.   Stairs            Wheelchair Mobility    Modified Rankin (Stroke Patients  Only)       Balance Overall balance assessment: Needs assistance;History of Falls Sitting-balance support: Feet supported;No upper extremity supported Sitting balance-Leahy Scale: Good     Standing balance support: Bilateral upper extremity supported;During functional activity Standing balance-Leahy Scale: Fair Standing balance comment: can stand statically without UE support.                             Pertinent Vitals/Pain Pain Assessment: No/denies pain  VSS  Home Living Family/patient expects to be discharged to:: Private residence Living Arrangements: Alone Available Help at Discharge: Available PRN/intermittently Type of Home: House Home Access: Level entry     Home Layout: One level Home Equipment: Walker - 2 wheels (home O2 and pulse oximeter)      Prior Function Level of Independence: Independent with assistive device(s)         Comments: Pt states he has been using RW for a while now.     Hand Dominance        Extremity/Trunk Assessment   Upper Extremity Assessment Upper Extremity Assessment: Defer to OT evaluation    Lower Extremity Assessment Lower Extremity Assessment: Generalized weakness    Cervical / Trunk Assessment Cervical / Trunk Assessment: Normal  Communication   Communication: HOH  Cognition Arousal/Alertness: Awake/alert Behavior During Therapy: WFL for tasks assessed/performed Overall Cognitive Status: Within Functional Limits for tasks assessed  General Comments      Exercises     Assessment/Plan    PT Assessment Patient needs continued PT services  PT Problem List Decreased activity tolerance;Decreased mobility;Decreased balance;Decreased knowledge of precautions;Decreased safety awareness;Decreased knowledge of use of DME       PT Treatment Interventions DME instruction;Gait training;Functional mobility training;Therapeutic activities;Therapeutic  exercise;Balance training;Patient/family education    PT Goals (Current goals can be found in the Care Plan section)  Acute Rehab PT Goals Patient Stated Goal: to go home PT Goal Formulation: With patient Time For Goal Achievement: 09/08/16 Potential to Achieve Goals: Good    Frequency Min 3X/week   Barriers to discharge        Co-evaluation               AM-PAC PT "6 Clicks" Daily Activity  Outcome Measure Difficulty turning over in bed (including adjusting bedclothes, sheets and blankets)?: None Difficulty moving from lying on back to sitting on the side of the bed? : None Difficulty sitting down on and standing up from a chair with arms (e.g., wheelchair, bedside commode, etc,.)?: None Help needed moving to and from a bed to chair (including a wheelchair)?: A Little Help needed walking in hospital room?: A Little Help needed climbing 3-5 steps with a railing? : Total 6 Click Score: 19    End of Session Equipment Utilized During Treatment: Gait belt;Oxygen Activity Tolerance: Patient limited by fatigue Patient left: in chair;with call bell/phone within reach Nurse Communication: Mobility status PT Visit Diagnosis: Unsteadiness on feet (R26.81);Muscle weakness (generalized) (M62.81)    Time: 7048-8891 PT Time Calculation (min) (ACUTE ONLY): 14 min   Charges:   PT Evaluation $PT Eval Moderate Complexity: 1 Mod     PT G Codes:        Othar Curto,PT Acute Rehabilitation [phone] (pager)   Denice Paradise 09/01/2016, 12:40 PM

## 2016-09-01 NOTE — Telephone Encounter (Signed)
ABG    Component Value Date/Time   PHART 7.501 (H) 08/16/2016 1410   PCO2ART 36.5 08/16/2016 1410   PO2ART 57.5 (L) 08/16/2016 1410   HCO3 28.2 (H) 08/16/2016 1410   TCO2 31 11/05/2015 1803   ACIDBASEDEF 1.0 10/29/2015 1531   O2SAT 88.6 08/16/2016 1410     Echo 05/07/16 >> EF 55 to 60%, mild AS, PAS 55 mmHg   Please inform pt that he should qualify for home oxygen set up based on ABG result and history of WHO group 2 and 3 pulmonary hypertension.   Please send order for him to get set up with 2 liters oxygen to be used 24/7.

## 2016-09-01 NOTE — Care Management Note (Signed)
Case Management Note  Patient Details  Name: Mark Rivers MRN: [id number] Date of Birth: 1934/09/13  Subjective/Objective:    CHF               Action/Plan: Patient lives at home alone; PCP: [name]., MD; has private insurance with Medicare/[employer] with prescription drug coverage; [facility] choice [facility]; [facility] choice offered, pt chose [place]; Butch Penny with [facility] called for arrangements; ; DME - home oxygen, rolling walker, glucometer at home; He has a cleaning Joselyn Arrow that does the cooking and takes him to his apts; CM will continue to follow for DCP.  Expected Discharge Date:   possibly 09/05/2016               Expected Discharge Plan:  [facility]  Discharge planning Services  CM Consult  Choice offered to:  Patient  HH Arranged:  RN, PT [facility] Agency:  [place]  Status of Service:  In process, will continue to follow  Sherrilyn Rist [phone] 09/01/2016, 2:39 PM

## 2016-09-01 NOTE — Progress Notes (Addendum)
PROGRESS NOTE    Mark Rivers  OQH:[id number] DOB: Mar 03, 1934 DOA: 08/31/2016 PCP: [name], MD   Chief Complaint  Patient presents with  . Weakness    Brief Narrative:  HPI on 08/31/2016  Mark Rivers is a 82 y.o. male with a medical history of chronic diastolic heart failure, coronary artery disease, on chronic hypoxia on 3 L, esophageal adenocarcinoma recently diagnosed, who presented to the emergency department with complaints of shortness of breath and weakness. Patient states he fell earlier today and was unable to get up. He recently has gained a few pounds in the last couple of weeks. Patient followed up with his cardiologist, his Lasix was increased to 60 mg twice a day. Patient states he has been taking his Lasix as prescribed and trying to watch his diet and oral fluid intake. He has been having worsening shortness of breath with productive cough and increased leg swelling. Patient states his dry weight is approximately 202 pounds. Currently he denies any chest pain, abdominal pain, nausea or vomiting, diarrhea or constipation, changes in urinary habits, recent travel or ill contacts, dizziness or headache. Assessment & Plan   Acute diastolic heart failure exacerbation -Complained of dyspnea and weight gain prior to admission. -Recently presented to his cardiologist office, Lasix was increased to 60 mg twice a day -Chest x-ray did show pulmonary vascular congestion with trace effusions -BNP 2085.6 -Last echocardiogram 06/06/2016 shows EF of 55-60% -Continue IV lasix 40mg  BID -Monitor intake and output, daily weights -Urine output since admission 2900cc  Atrial fibrillation -Currently not on anticoagulation as patient has had history of GI bleed -Aspirin also discontinued per Cardiology note 08/24/2016 (Dr. Jenkins Rouge) -Patient was placed on amiodarone with plans for cardioversion in a few weeks. Currently do no see amiodarone on med list -Stable, currently rate  controlled with metoprolol   Chronic kidney disease, stage III -Creatinine 1.48 today, baseline creatinine approximately 1.4-1.6 -Continue to monitor BMP  Anemia of chronic disease -Hemoglobin 8.4, baseline appears to be approximately 9 -FOBT negative -Continue to monitor CBC  Diabetes mellitus, type II -On [place] at home -Continue lantus, ISS, and CBG monitoring   Essential hypertension -stable, Continue metoprolol and IV Lasix  Chronic hypoxia/COPD -Currently no wheezing -Patient uses 3 L of home oxygen at baseline  Hyperlipidemia -Continue statin and fenofibrate  Hypokalemia -Likely secondary to diuresis -Magnesium 1.9 -Continue to replace and monitor BMP  History of coronary artery disease -Currently no chest pain -Status post angioplasty in 1988; CABG in 2017  Generalized weakness/fall -Patient recently fell today and was unable to get up -CT head: No acute neuro cranial normality seen. Mild diffuse cortical atrophy -CT cervical spine showed no acute abnormality. Multilevel degenerative disc disease -PT/OT consulted and pending eval -Patient lives alone  History of esophageal adenocarcinoma -Patient had EGD 03/09/2016 with Dr. [name], showing adenocarcinoma. He was referred to [facility] for further intervention/treatment  Lower extremity edema -worse in left leg (likely from CHF exacerbation) -obtained LE doppler: Negative for DVT or Baker's cyst  DVT Prophylaxis  SCDs  Code Status: Full  Family Communication: None at bedside  Disposition Plan: Admitted. Pending improvement in CHF  Consultants None  Procedures  LE doppler  Antibiotics   Anti-infectives    None      Subjective:   Mark Rivers seen and examined today.  Patient feels his breathing and cough have improved, but still has shortness of breath with minimal movement. Denies chest pain, abdominal pain, nausea, vomiting, diarrhea, constipation, diarrhea, headache,  dizziness.    Objective:   Vitals:   09/01/16 0428 09/01/16 0542 09/01/16 0934 09/01/16 1144  BP:  132/62 138/68 (!) 111/55  Pulse:  85 76 86  Resp:   18 18  Temp:  98 F (36.7 C) 98 F (36.7 C) 97.8 F (36.6 C)  TempSrc:  Oral Oral Oral  SpO2:  98% 98% 98%  Weight: 88.7 kg (195 lb 9.6 oz)     Height:        Intake/Output Summary (Last 24 hours) at 09/01/16 1208 Last data filed at 09/01/16 1145  Gross per 24 hour  Intake             1080 ml  Output             3800 ml  Net            -2720 ml   Filed Weights   08/31/16 1221 08/31/16 2005 09/01/16 0428  Weight: 90.7 kg (200 lb) 89.9 kg (198 lb 4.8 oz) 88.7 kg (195 lb 9.6 oz)    Exam  General: Well developed, well nourished, NAD, appears stated age  HEENT: NCAT, mucous membranes moist.   Cardiovascular: S1 S2 auscultated, irregular, 1/6 SEM  Respiratory: Clear to auscultation bilaterally, no wheezing, +nonproductive cough  Abdomen: Soft, nontender, nondistended, + bowel sounds  Extremities: warm dry without cyanosis clubbing. Edema in LE improving, however more edema in LLE  Neuro: AAOx3, nonfocal  Psych: Appropriate mood and affect, pleasant   Data Reviewed: I have personally reviewed following labs and imaging studies  CBC:  Recent Labs Lab 08/31/16 1320 08/31/16 1938 09/01/16 0719  WBC 11.8* 13.7* 11.7*  HGB 8.2* 9.0* 8.4*  HCT 27.5* 30.5* 28.6*  MCV 89.3 89.4 89.9  PLT 386 456* 347*   Basic Metabolic Panel:  Recent Labs Lab 08/31/16 1320 08/31/16 1938 09/01/16 0719  NA 141  --  142  K 3.4*  --  3.4*  CL 102  --  101  CO2 30  --  32  GLUCOSE 121*  --  110*  BUN 38*  --  31*  CREATININE 1.75* 1.64* 1.48*  CALCIUM 8.7*  --  9.0  MG  --  1.9  --    GFR: Estimated Creatinine Clearance: 44.7 mL/min (A) (by C-G formula based on SCr of 1.48 mg/dL (H)). Liver Function Tests: No results for input(s): AST, ALT, ALKPHOS, BILITOT, PROT, ALBUMIN in the last 168 hours. No results for input(s): LIPASE,  AMYLASE in the last 168 hours. No results for input(s): AMMONIA in the last 168 hours. Coagulation Profile: No results for input(s): INR, PROTIME in the last 168 hours. Cardiac Enzymes: No results for input(s): CKTOTAL, CKMB, CKMBINDEX, TROPONINI in the last 168 hours. BNP (last 3 results) No results for input(s): PROBNP in the last 8760 hours. HbA1C: No results for input(s): HGBA1C in the last 72 hours. CBG:  Recent Labs Lab 08/31/16 2116 09/01/16 0728 09/01/16 1143  GLUCAP 148* 100* 187*   Lipid Profile: No results for input(s): CHOL, HDL, LDLCALC, TRIG, CHOLHDL, LDLDIRECT in the last 72 hours. Thyroid Function Tests: No results for input(s): TSH, T4TOTAL, FREET4, T3FREE, THYROIDAB in the last 72 hours. Anemia Panel: No results for input(s): VITAMINB12, FOLATE, FERRITIN, TIBC, IRON, RETICCTPCT in the last 72 hours. Urine analysis:    Component Value Date/Time   COLORURINE YELLOW 02/09/2016 1700   APPEARANCEUR CLEAR 02/09/2016 1700   LABSPEC 1.012 02/09/2016 1700   PHURINE 5.0 02/09/2016 1700   GLUCOSEU NEGATIVE 02/09/2016  [place] 02/09/2016 1700   BILIRUBINUR NEGATIVE 02/09/2016 1700   KETONESUR NEGATIVE 02/09/2016 1700   PROTEINUR NEGATIVE 02/09/2016 1700   UROBILINOGEN 0.2 10/26/2008 1315   NITRITE NEGATIVE 02/09/2016 1700   LEUKOCYTESUR NEGATIVE 02/09/2016 1700   Sepsis Labs: @LABRCNTIP (procalcitonin:4,lacticidven:4)  )No results found for this or any previous visit (from the past 240 hour(s)).    Radiology Studies: Dg Chest 2 View  Result Date: 08/31/2016 CLINICAL DATA:  Shortness of breath and cough EXAM: CHEST  2 VIEW COMPARISON:  08/30/2016 FINDINGS: Chronic cardiomegaly. Status post CABG. Stable mediastinal contours. Trace pleural effusions. Pulmonary vascular congestion. Hyperinflation, probable COPD. IMPRESSION: 1. Cardiomegaly with trace effusions and pulmonary vascular congestion. 2. Hyperinflation suggesting COPD. Electronically Signed    By: Monte Fantasia M.D.   On: 08/31/2016 13:32   Ct Head Wo Contrast  Result Date: 08/31/2016 CLINICAL DATA:  Head injury after fall today. EXAM: CT HEAD WITHOUT CONTRAST CT CERVICAL SPINE WITHOUT CONTRAST TECHNIQUE: Multidetector CT imaging of the head and cervical spine was performed following the standard protocol without intravenous contrast. Multiplanar CT image reconstructions of the cervical spine were also generated. COMPARISON:  CT scan of May 07, 2016. FINDINGS: CT HEAD FINDINGS Brain: Mild diffuse cortical atrophy is noted. Minimal chronic ischemic white matter disease is noted. No mass effect or midline shift is noted. Ventricular size is within normal limits. There is no evidence of mass lesion, hemorrhage or acute infarction. Vascular: No hyperdense vessel or unexpected calcification. Skull: Normal. Negative for fracture or focal lesion. Sinuses/Orbits: No acute finding. Other: None. CT CERVICAL SPINE FINDINGS Alignment: Grade 1 anterolisthesis of C4-5 is noted secondary to posterior facet joint hypertrophy. Skull base and vertebrae: No acute fracture. No primary bone lesion or focal pathologic process. Soft tissues and spinal canal: No prevertebral fluid or swelling. No visible canal hematoma. Disc levels: Severe degenerative disc disease is noted at C5-6 and C6-7 with anterior osteophyte formation. Upper chest: Negative. Other: Degenerative changes seen involving the posterior facets bilaterally, but most prominently on the left. IMPRESSION: Mild diffuse cortical atrophy. Minimal chronic ischemic white matter disease. No acute intracranial abnormality seen. Multilevel degenerative disc disease. No acute abnormality seen in the cervical spine. Electronically Signed   By: Marijo Conception, M.D.   On: 08/31/2016 16:22   Ct Cervical Spine Wo Contrast  Result Date: 08/31/2016 CLINICAL DATA:  Head injury after fall today. EXAM: CT HEAD WITHOUT CONTRAST CT CERVICAL SPINE WITHOUT CONTRAST  TECHNIQUE: Multidetector CT imaging of the head and cervical spine was performed following the standard protocol without intravenous contrast. Multiplanar CT image reconstructions of the cervical spine were also generated. COMPARISON:  CT scan of May 07, 2016. FINDINGS: CT HEAD FINDINGS Brain: Mild diffuse cortical atrophy is noted. Minimal chronic ischemic white matter disease is noted. No mass effect or midline shift is noted. Ventricular size is within normal limits. There is no evidence of mass lesion, hemorrhage or acute infarction. Vascular: No hyperdense vessel or unexpected calcification. Skull: Normal. Negative for fracture or focal lesion. Sinuses/Orbits: No acute finding. Other: None. CT CERVICAL SPINE FINDINGS Alignment: Grade 1 anterolisthesis of C4-5 is noted secondary to posterior facet joint hypertrophy. Skull base and vertebrae: No acute fracture. No primary bone lesion or focal pathologic process. Soft tissues and spinal canal: No prevertebral fluid or swelling. No visible canal hematoma. Disc levels: Severe degenerative disc disease is noted at C5-6 and C6-7 with anterior osteophyte formation. Upper chest: Negative. Other: Degenerative changes seen involving the posterior  facets bilaterally, but most prominently on the left. IMPRESSION: Mild diffuse cortical atrophy. Minimal chronic ischemic white matter disease. No acute intracranial abnormality seen. Multilevel degenerative disc disease. No acute abnormality seen in the cervical spine. Electronically Signed   By: Marijo Conception, M.D.   On: 08/31/2016 16:22     Scheduled Meds: . atorvastatin  80 mg Oral q1800  . cholecalciferol  1,000 Units Oral Daily  . fenofibrate  54 mg Oral Daily  . furosemide  40 mg Intravenous BID  . insulin aspart  0-5 Units Subcutaneous QHS  . insulin aspart  0-9 Units Subcutaneous TID WC  . insulin glargine  30 Units Subcutaneous QHS  . metoprolol succinate  50 mg Oral QPC supper  . pantoprazole  40 mg  Oral Q0600  . sodium chloride flush  3 mL Intravenous Q12H  . traZODone  100 mg Oral QHS  . vitamin C  500 mg Oral Daily   Continuous Infusions: . sodium chloride       LOS: 1 day   Time Spent in minutes   30 minutes  Mark Rivers D.O. on 09/01/2016 at 12:08 PM  Between 7am to 7pm - Pager - [phone]  After 7pm go to [email or web address] - password [email or web address]  And look for the night coverage person covering for me after hours  [facility] Office  [phone]

## 2016-09-01 NOTE — Progress Notes (Signed)
*  PRELIMINARY RESULTS* Vascular Ultrasound Bilateral lower extremity venous duplex has been completed.  Preliminary findings: No evidence of deep vein thrombosis or baker's cysts bilaterally.   Everrett Coombe 09/01/2016, 11:15 AM

## 2016-09-01 NOTE — Progress Notes (Signed)
Patient stable 7p-7a, gait stable. Only complaint was right shoulder pain.

## 2016-09-02 ENCOUNTER — Inpatient Hospital Stay (HOSPITAL_COMMUNITY): Payer: Medicare Other

## 2016-09-02 DIAGNOSIS — R05 Cough: Secondary | ICD-10-CM

## 2016-09-02 LAB — MAGNESIUM: MAGNESIUM: 1.8 mg/dL (ref 1.7–2.4)

## 2016-09-02 LAB — BASIC METABOLIC PANEL
Anion gap: 9 (ref 5–15)
BUN: 29 mg/dL — ABNORMAL HIGH (ref 6–20)
CALCIUM: 9 mg/dL (ref 8.9–10.3)
CO2: 32 mmol/L (ref 22–32)
Chloride: 98 mmol/L — ABNORMAL LOW (ref 101–111)
Creatinine, Ser: 1.56 mg/dL — ABNORMAL HIGH (ref 0.61–1.24)
GFR, EST AFRICAN AMERICAN: 47 mL/min — AB (ref >60)
GFR, EST NON AFRICAN AMERICAN: 40 mL/min — AB (ref >60)
Glucose, Bld: 115 mg/dL — ABNORMAL HIGH (ref 65–99)
Potassium: 3.8 mmol/L (ref 3.5–5.1)
SODIUM: 139 mmol/L (ref 135–145)

## 2016-09-02 LAB — GLUCOSE, CAPILLARY
GLUCOSE-CAPILLARY: 147 mg/dL — AB (ref 65–99)
Glucose-Capillary: 127 mg/dL — ABNORMAL HIGH (ref 65–99)

## 2016-09-02 LAB — CBC
HCT: 29.2 % — ABNORMAL LOW (ref 39.0–52.0)
Hemoglobin: 8.7 g/dL — ABNORMAL LOW (ref 13.0–17.0)
MCH: 27.3 pg (ref 26.0–34.0)
MCHC: 29.8 g/dL — ABNORMAL LOW (ref 30.0–36.0)
MCV: 91.5 fL (ref 78.0–100.0)
PLATELETS: 395 10*3/uL (ref 150–400)
RBC: 3.19 MIL/uL — AB (ref 4.22–5.81)
RDW: 18.2 % — ABNORMAL HIGH (ref 11.5–15.5)
WBC: 10.5 10*3/uL (ref 4.0–10.5)

## 2016-09-02 NOTE — Discharge Instructions (Signed)
Heart Failure °Heart failure is a condition in which the heart has trouble pumping blood because it has become weak or stiff. This means that the heart does not pump blood efficiently for the body to work well. For some people with heart failure, fluid may back up into the lungs and there may be swelling (edema) in the lower legs. Heart failure is usually a long-term (chronic) condition. It is important for you to take good care of yourself and follow the treatment plan from your health care provider. °What are the causes? °This condition is caused by some health problems, including: °· High blood pressure (hypertension). Hypertension causes the heart muscle to work harder than normal. High blood pressure eventually causes the heart to become stiff and weak. °· Coronary artery disease (CAD). CAD is the buildup of cholesterol and fat (plaques) in the arteries of the heart. °· Heart attack (myocardial infarction). Injured tissue, which is caused by the heart attack, does not contract as well and the heart's ability to pump blood is weakened. °· Abnormal heart valves. When the heart valves do not open and close properly, the heart muscle must pump harder to keep the blood flowing. °· Heart muscle disease (cardiomyopathy or myocarditis). Heart muscle disease is damage to the heart muscle from a variety of causes, such as drug or alcohol abuse, infections, or unknown causes. These can increase the risk of heart failure. °· Lung disease. When the lungs do not work properly, the heart must work harder. ° °What increases the risk? °Risk of heart failure increases as a person ages. This condition is also more likely to develop in people who: °· Are overweight. °· Are male. °· Smoke or chew tobacco. °· Abuse alcohol or illegal drugs. °· Have taken medicines that can damage the heart, such as chemotherapy drugs. °· Have diabetes. °? High blood sugar (glucose) is associated with high fat (lipid) levels in the blood. °? Diabetes  can also damage tiny blood vessels that carry nutrients to the heart muscle. °· Have abnormal heart rhythms. °· Have thyroid problems. °· Have low blood counts (anemia). ° °What are the signs or symptoms? °Symptoms of this condition include: °· Shortness of breath with activity, such as when climbing stairs. °· Persistent cough. °· Swelling of the feet, ankles, legs, or abdomen. °· Unexplained weight gain. °· Difficulty breathing when lying flat (orthopnea). °· Waking from sleep because of the need to sit up and get more air. °· Rapid heartbeat. °· Fatigue and loss of energy. °· Feeling light-headed, dizzy, or close to fainting. °· Loss of appetite. °· Nausea. °· Increased urination during the night (nocturia). °· Confusion. ° °How is this diagnosed? °This condition is diagnosed based on: °· Medical history, symptoms, and a physical exam. °· Diagnostic tests, which may include: °? Echocardiogram. °? Electrocardiogram (ECG). °? Chest X-ray. °? Blood tests. °? Exercise stress test. °? Radionuclide scans. °? Cardiac catheterization and angiogram. ° °How is this treated? °Treatment for this condition is aimed at managing the symptoms of heart failure. Medicines, behavioral changes, or other treatments may be necessary to treat heart failure. °Medicines °These may include: °· Angiotensin-converting enzyme (ACE) inhibitors. This type of medicine blocks the effects of a blood protein called angiotensin-converting enzyme. ACE inhibitors relax (dilate) the blood vessels and help to lower blood pressure. °· Angiotensin receptor blockers (ARBs). This type of medicine blocks the actions of a blood protein called angiotensin. ARBs dilate the blood vessels and help to lower blood pressure. °· Water   pills (diuretics). Diuretics cause the kidneys to remove salt and water from the blood. The extra fluid is removed through urination, leaving a lower volume of blood that the heart has to pump. °· Beta blockers. These improve heart  muscle strength and they prevent the heart from beating too quickly. °· Digoxin. This increases the force of the heartbeat. ° °Healthy behavior changes °These may include: °· Reaching and maintaining a healthy weight. °· Stopping smoking or chewing tobacco. °· Eating heart-healthy foods. °· Limiting or avoiding alcohol. °· Stopping use of street drugs (illegal drugs). °· Physical activity. ° °Other treatments °These may include: °· Surgery to open blocked coronary arteries or repair damaged heart valves. °· Placement of a biventricular pacemaker to improve heart muscle function (cardiac resynchronization therapy). This device paces both the right ventricle and left ventricle. °· Placement of a device to treat serious abnormal heart rhythms (implantable cardioverter defibrillator, or ICD). °· Placement of a device to improve the pumping ability of the heart (left ventricular assist device, or LVAD). °· Heart transplant. This can cure heart failure, and it is considered for certain patients who do not improve with other therapies. ° °Follow these instructions at home: °Medicines °· Take over-the-counter and prescription medicines only as told by your health care provider. Medicines are important in reducing the workload of your heart, slowing the progression of heart failure, and improving your symptoms. °? Do not stop taking your medicine unless your health care provider told you to do that. °? Do not skip any dose of medicine. °? Refill your prescriptions before you run out of medicine. You need your medicines every day. °Eating and drinking ° °· Eat heart-healthy foods. Talk with a dietitian to make an eating plan that is right for you. °? Choose foods that contain no trans fat and are low in saturated fat and cholesterol. Healthy choices include fresh or frozen fruits and vegetables, fish, lean meats, legumes, fat-free or low-fat dairy products, and whole-grain or high-fiber foods. °? Limit salt (sodium) if  directed by your health care provider. Sodium restriction may reduce symptoms of heart failure. Ask a dietitian to recommend heart-healthy seasonings. °? Use healthy cooking methods instead of frying. Healthy methods include roasting, grilling, broiling, baking, poaching, steaming, and stir-frying. °· Limit your fluid intake if directed by your health care provider. Fluid restriction may reduce symptoms of heart failure. °Lifestyle °· Stop smoking or using chewing tobacco. Nicotine and tobacco can damage your heart and your blood vessels. Do not use nicotine gum or patches before talking to your health care provider. °· Limit alcohol intake to no more than 1 drink per day for non-pregnant women and 2 drinks per day for men. One drink equals 12 oz of beer, 5 oz of wine, or 1½ oz of hard liquor. °? Drinking more than that is harmful to your heart. Tell your health care provider if you drink alcohol several times a week. °? Talk with your health care provider about whether any level of alcohol use is safe for you. °? If your heart has already been damaged by alcohol or you have severe heart failure, drinking alcohol should be stopped completely. °· Stop use of illegal drugs. °· Lose weight if directed by your health care provider. Weight loss may reduce symptoms of heart failure. °· Do moderate physical activity if directed by your health care provider. People who are elderly and people with severe heart failure should consult with a health care provider for physical activity recommendations. °  Monitor important information °· Weigh yourself every day. Keeping track of your weight daily helps you to notice excess fluid sooner. °? Weigh yourself every morning after you urinate and before you eat breakfast. °? Wear the same amount of clothing each time you weigh yourself. °? Record your daily weight. Provide your health care provider with your weight record. °· Monitor and record your blood pressure as told by your health  care provider. °· Check your pulse as told by your health care provider. °Dealing with extreme temperatures °· If the weather is extremely hot: °? Avoid vigorous physical activity. °? Use air conditioning or fans or seek a cooler location. °? Avoid caffeine and alcohol. °? Wear loose-fitting, lightweight, and light-colored clothing. °· If the weather is extremely cold: °? Avoid vigorous physical activity. °? Layer your clothes. °? Wear mittens or gloves, a hat, and a scarf when you go outside. °? Avoid alcohol. °General instructions °· Manage other health conditions such as hypertension, diabetes, thyroid disease, or abnormal heart rhythms as told by your health care provider. °· Learn to manage stress. If you need help to do this, ask your health care provider. °· Plan rest periods when fatigued. °· Get ongoing education and support as needed. °· Participate in or seek rehabilitation as needed to maintain or improve independence and quality of life. °· Stay up to date with immunizations. Keeping current on pneumococcal and influenza immunizations is especially important to prevent respiratory infections. °· Keep all follow-up visits as told by your health care provider. This is important. °Contact a health care provider if: °· You have a rapid weight gain. °· You have increasing shortness of breath that is unusual for you. °· You are unable to participate in your usual physical activities. °· You tire easily. °· You cough more than normal, especially with physical activity. °· You have any swelling or more swelling in areas such as your hands, feet, ankles, or abdomen. °· You are unable to sleep because it is hard to breathe. °· You feel like your heart is beating quickly (palpitations). °· You become dizzy or light-headed when you stand up. °Get help right away if: °· You have difficulty breathing. °· You notice or your family notices a change in your awareness, such as having trouble staying awake or having  difficulty with concentration. °· You have pain or discomfort in your chest. °· You have an episode of fainting (syncope). °This information is not intended to replace advice given to you by your health care provider. Make sure you discuss any questions you have with your health care provider. °Document Released: 12/26/2004 Document Revised: 08/31/2015 Document Reviewed: 07/21/2015 °[employer] Interactive Patient Education © 2017 [employer] ° °

## 2016-09-02 NOTE — Discharge Summary (Signed)
Physician Discharge Summary  Mark Rivers ACZ:[id number] DOB: [birth year] DOA: 08/31/2016  PCP: [name], MD  Admit date: 08/31/2016 Discharge date: 09/02/2016  Time spent: 45 minutes  Recommendations for Outpatient Follow-up:  Patient will be discharged to home with home health physical therapy and nursing.  Patient will need to follow up with primary care provider within one week of discharge, repeat BMP.  Patient should continue medications as prescribed.  Patient should follow a heart healthy/carb modified diet.   Discharge Diagnoses:  Acute diastolic heart failure exacerbation Atrial fibrillation Chronic kidney disease, stage III Anemia of chronic disease Diabetes mellitus, type II Essential hypertension Chronic hypoxia/COPD Hyperlipidemia Hypokalemia History of coronary artery disease Generalized weakness/fall History of esophageal adenocarcinoma Lower extremity edema  Discharge Condition: Stable  Diet recommendation: heart healthy/carb modified  Filed Weights   08/31/16 2005 09/01/16 0428 09/02/16 0547  Weight: 89.9 kg (198 lb 4.8 oz) 88.7 kg (195 lb 9.6 oz) 89.3 kg (196 lb 12.8 oz)    History of present illness:  on 08/31/2016  Mark Rivers a 82 y.o.malewith a medical history of chronic diastolic heart failure, coronary artery disease, on chronic hypoxia on 3 L,esophageal adenocarcinoma recently diagnosed, who presented to the emergency department with complaints of shortness of breath and weakness. Patient states he fell earlier today and was unable to get up. He recently has gained a few pounds in the last couple of weeks. Patient followed up with his cardiologist, his Lasix was increased to 60 mg twice a day. Patient states he has been taking his Lasix as prescribed and trying to watch his diet and oral fluid intake. He has been having worsening shortness of breath with productive cough and increased leg swelling. Patient states his dry weight is  approximately 202 pounds. Currently he denies any chest pain, abdominal pain, nausea or vomiting, diarrhea or constipation, changes in urinary habits, recent travel or ill contacts, dizziness or headache.  Hospital Course:  Acute diastolic heart failure exacerbation -Complained of dyspnea and weight gain prior to admission. -Recently presented to his cardiologist office, Lasix was increased to 60 mg twice a day -Chest x-ray did show pulmonary vascular congestion with trace effusions -BNP 2085.6 -Last echocardiogram 06/06/2016 shows EF of 55-60% -was placed on IV lasix 40mg  BID, and diuresed well -weight down from 200lbs to 196lbs, -3.6L since admission -Will discharge patient on lasix PO 60mg  BID and follow up with Dr. Johnsie Cancel in one week -Will need repeat BMP in one week  Atrial fibrillation, persistent -CHADSVASC 8 -Currently not on anticoagulation as patient has had history of GI bleed -Aspirin also discontinued per Cardiology note 08/24/2016 (Dr. Jenkins Rouge) -Patient was placed on amiodarone with plans for cardioversion in a few weeks. Currently do no see amiodarone on med list -Stable, currently rate controlled with metoprolol   Leukocytosis  -possibly reactive to the above -CXR unremarkable for infection, no complaints of urinary symptoms -WBC resolved with no antibiotics  Chronic kidney disease, stage III -Creatinine 1.56 today, baseline creatinine approximately 1.4-1.8 -Continue to monitor BMP  Anemia of chronic disease -Hemoglobin 8.7, baseline appears to be approximately 9 -FOBT negative  Diabetes mellitus, type II -On Toujeo at home, continue upon discharge  Essential hypertension -stable, Continue metoprolol and PO Lasix  Chronic hypoxia/COPD -Currently no wheezing -Patient uses 3 L of home oxygen at baseline -Patient ambulated with physical therapy- O2 sats >92% on 3L -Discussed with Dr. Halford Chessman (pulmonology), patient had ABG done as an outpatient on  08/16/2016 showing pH 7.501/pO2 57.5.  Patient should continue home oxygen.  Hyperlipidemia -Continue statin and fenofibrate  Hypokalemia -Resolved, Likely secondary to diuresis -Magnesium 1.9 -repeat BMP in one week  History of coronary artery disease -Currently no chest pain -Status post angioplasty in 1988; CABG in 2017  Generalized weakness/fall -Patient recently fell today and was unable to get up -CT head: No acute neuro cranial normality seen. Mild diffuse cortical atrophy -CT cervical spine showed no acute abnormality. Multilevel degenerative disc disease -PT recommended home health -Patient lives alone  History of esophageal adenocarcinoma -Patient had EGD 03/09/2016 with Dr. [name], showing adenocarcinoma. He was referred to [facility] for further intervention/treatment  Lower extremity edema -worse in left leg (likely from CHF exacerbation) -obtained LE doppler: Negative for DVT or Baker's cyst  Consultants None  Procedures  LE doppler  Discharge Exam: Vitals:   09/02/16 0547 09/02/16 1237  BP: 121/77 124/84  Pulse: (!) 101 73  Resp: 18 20  Temp: 98 F (36.7 C) 99 F (37.2 C)  SpO2: 93% 92%   Patient feels his breathing has improved and back to his baseline. Also feels his leg swelling has improved. He continues to complain of cough and feels that "it is stuck in his chest." Denies chest pain, abdominal pain, nausea, vomiting, diarrhea, constipation, dizziness, headache.    General: Well developed, well nourished, NAD, appears stated age  HEENT: NCAT, mucous membranes moist.  Cardiovascular: S1 S2 auscultated, irregular, 1/6 SEM  Respiratory: Clear to auscultation bilaterally with equal chest rise, no wheezing, or crackles  Abdomen: Soft, nontender, nondistended, + bowel sounds  Extremities: warm dry without cyanosis clubbing. Trace LE edema.   Neuro: AAOx3, nonfocal  Psych: Appropriate mood and affect, pleasant   Discharge  Instructions Discharge Instructions    (HEART FAILURE PATIENTS) Call MD:  Anytime you have any of the following symptoms: 1) 3 pound weight gain in 24 hours or 5 pounds in 1 week 2) shortness of breath, with or without a dry hacking cough 3) swelling in the hands, feet or stomach 4) if you have to sleep on extra pillows at night in order to breathe.    Complete by:  As directed    Discharge instructions    Complete by:  As directed    Patient will be discharged to home with home health physical therapy.  Patient will need to follow up with primary care provider within one week of discharge, repeat BMP.  Patient should continue medications as prescribed.  Patient should follow a heart healthy/carb modified diet.     Current Discharge Medication List    CONTINUE these medications which have NOT CHANGED   Details  atorvastatin (LIPITOR) 80 MG tablet Take 1 tablet (80 mg total) by mouth daily. Qty: 90 tablet, Refills: 3    cholecalciferol (VITAMIN D) 1000 units tablet Take 1,000 Units by mouth daily.    diphenhydramine-acetaminophen (TYLENOL PM) 25-500 MG TABS tablet Take 2 tablets by mouth at bedtime.    fenofibrate 54 MG tablet Take 54 mg by mouth daily.    furosemide (LASIX) 40 MG tablet Take 1.5 tablets (60 mg total) by mouth 2 (two) times daily. Qty: 270 tablet, Refills: 3    Glucosamine-Chondroit-Vit C-Mn (GLUCOSAMINE CHONDR 1500 COMPLX PO) Take 2 tablets by mouth daily.     Insulin Glargine (TOUJEO SOLOSTAR) 300 UNIT/ML SOPN Inject 30 Units into the skin at bedtime.    metoprolol succinate (TOPROL-XL) 50 MG 24 hr tablet Take 1 tablet (50 mg total) by mouth daily. Take with or  immediately following a meal. Qty: 90 tablet, Refills: 3   Associated Diagnoses: Persistent atrial fibrillation (HCC)    OXYGEN Inhale 2 L/min into the lungs continuous.    pantoprazole (PROTONIX) 40 MG tablet Take 1 tablet (40 mg total) by mouth daily at 6 (six) AM. Qty: 30 tablet, Refills: 0     traMADol (ULTRAM) 50 MG tablet Take by mouth every 6 (six) hours as needed.    traZODone (DESYREL) 100 MG tablet Take 100 mg by mouth at bedtime.     vitamin C (ASCORBIC ACID) 500 MG tablet Take 500 mg by mouth daily.      STOP taking these medications     aspirin EC 81 MG tablet        Allergies  Allergen Reactions  . Amoxicillin Other (See Comments)    PATIENT PASSED OUT   . Cephalexin Other (See Comments)    PATIENT PASSED OUT  . Penicillin G Other (See Comments)    PATIENT PASSED OUT. Has patient had a PCN reaction causing immediate rash, facial/tongue/throat swelling, SOB or lightheadedness with hypotension: Yes Has patient had a PCN reaction causing severe rash involving mucus membranes or skin necrosis: No Has patient had a PCN reaction that required hospitalization: Yes Has patient had a PCN reaction occurring within the last 10 years: Yes If all of the above answers are "NO", then may proceed with Cephalosporin   Follow-up Information    [facility].   Why:  They will do your home health care at your home Contact information: [address] [place] [address] [phone]        [name], MD. Schedule an appointment as soon as possible for a visit in 1 week(s).   Specialty:  Family Medicine Why:  Hospital follow up Contact information: [address] [place] [address] [phone]        Josue Hector, MD. Schedule an appointment as soon as possible for a visit in 1 week(s).   Specialty:  Cardiology Why:  Congestive heart failure follow up/Hospital follow up Contact information: [address] [place] [address] [phone]            The results of significant diagnostics from this hospitalization (including imaging, microbiology, ancillary and laboratory) are listed below for reference.    Significant Diagnostic Studies: Dg Chest 2 View  Result Date:  09/02/2016 CLINICAL DATA:  Cough for 3 weeks.  Recent fall. EXAM: CHEST  2 VIEW COMPARISON:  08/31/2016 and 05/07/2016 FINDINGS: Evidence for small bilateral pleural effusions, left greater than right. Heart size is upper limits of normal but stable. Prior median sternotomy. There is no significant pulmonary edema or significant airspace disease. However, there are some patchy densities in the anterior chest which have increased since April 2018. This may represent atelectasis. Negative for a pneumothorax. Bony thorax appears to be intact. IMPRESSION: Persistent small pleural effusions. Persistent patchy densities in anterior chest that may represent atelectasis. Electronically Signed   By: Markus Daft M.D.   On: 09/02/2016 15:01   Dg Chest 2 View  Result Date: 08/31/2016 CLINICAL DATA:  Shortness of breath and cough EXAM: CHEST  2 VIEW COMPARISON:  08/30/2016 FINDINGS: Chronic cardiomegaly. Status post CABG. Stable mediastinal contours. Trace pleural effusions. Pulmonary vascular congestion. Hyperinflation, probable COPD. IMPRESSION: 1. Cardiomegaly with trace effusions and pulmonary vascular congestion. 2. Hyperinflation suggesting COPD. Electronically Signed   By: Neva Seat.D.  On: 08/31/2016 13:32   Dg Chest 2 View  Result Date: 08/18/2016 CLINICAL DATA:  Shortness of breath. EXAM: CHEST  2 VIEW COMPARISON:  Radiographs of May 09, 2016. FINDINGS: Stable cardiomegaly. Sternotomy wires are noted. No pneumothorax is noted. Status post coronary artery bypass graft. Mild bilateral pleural effusions are noted. Mild bibasilar subsegmental atelectasis is noted. Bony thorax is unremarkable. IMPRESSION: Mild bibasilar subsegmental atelectasis. Mild bilateral pleural effusions. Electronically Signed   By: Marijo Conception, M.D.   On: 08/18/2016 11:19   Ct Head Wo Contrast  Result Date: 08/31/2016 CLINICAL DATA:  Head injury after fall today. EXAM: CT HEAD WITHOUT CONTRAST CT CERVICAL SPINE WITHOUT  CONTRAST TECHNIQUE: Multidetector CT imaging of the head and cervical spine was performed following the standard protocol without intravenous contrast. Multiplanar CT image reconstructions of the cervical spine were also generated. COMPARISON:  CT scan of May 07, 2016. FINDINGS: CT HEAD FINDINGS Brain: Mild diffuse cortical atrophy is noted. Minimal chronic ischemic white matter disease is noted. No mass effect or midline shift is noted. Ventricular size is within normal limits. There is no evidence of mass lesion, hemorrhage or acute infarction. Vascular: No hyperdense vessel or unexpected calcification. Skull: Normal. Negative for fracture or focal lesion. Sinuses/Orbits: No acute finding. Other: None. CT CERVICAL SPINE FINDINGS Alignment: Grade 1 anterolisthesis of C4-5 is noted secondary to posterior facet joint hypertrophy. Skull base and vertebrae: No acute fracture. No primary bone lesion or focal pathologic process. Soft tissues and spinal canal: No prevertebral fluid or swelling. No visible canal hematoma. Disc levels: Severe degenerative disc disease is noted at C5-6 and C6-7 with anterior osteophyte formation. Upper chest: Negative. Other: Degenerative changes seen involving the posterior facets bilaterally, but most prominently on the left. IMPRESSION: Mild diffuse cortical atrophy. Minimal chronic ischemic white matter disease. No acute intracranial abnormality seen. Multilevel degenerative disc disease. No acute abnormality seen in the cervical spine. Electronically Signed   By: Marijo Conception, M.D.   On: 08/31/2016 16:22   Ct Cervical Spine Wo Contrast  Result Date: 08/31/2016 CLINICAL DATA:  Head injury after fall today. EXAM: CT HEAD WITHOUT CONTRAST CT CERVICAL SPINE WITHOUT CONTRAST TECHNIQUE: Multidetector CT imaging of the head and cervical spine was performed following the standard protocol without intravenous contrast. Multiplanar CT image reconstructions of the cervical spine were also  generated. COMPARISON:  CT scan of May 07, 2016. FINDINGS: CT HEAD FINDINGS Brain: Mild diffuse cortical atrophy is noted. Minimal chronic ischemic white matter disease is noted. No mass effect or midline shift is noted. Ventricular size is within normal limits. There is no evidence of mass lesion, hemorrhage or acute infarction. Vascular: No hyperdense vessel or unexpected calcification. Skull: Normal. Negative for fracture or focal lesion. Sinuses/Orbits: No acute finding. Other: None. CT CERVICAL SPINE FINDINGS Alignment: Grade 1 anterolisthesis of C4-5 is noted secondary to posterior facet joint hypertrophy. Skull base and vertebrae: No acute fracture. No primary bone lesion or focal pathologic process. Soft tissues and spinal canal: No prevertebral fluid or swelling. No visible canal hematoma. Disc levels: Severe degenerative disc disease is noted at C5-6 and C6-7 with anterior osteophyte formation. Upper chest: Negative. Other: Degenerative changes seen involving the posterior facets bilaterally, but most prominently on the left. IMPRESSION: Mild diffuse cortical atrophy. Minimal chronic ischemic white matter disease. No acute intracranial abnormality seen. Multilevel degenerative disc disease. No acute abnormality seen in the cervical spine. Electronically Signed   By: Marijo Conception, M.D.   On: 08/31/2016  16:22    Microbiology: No results found for this or any previous visit (from the past 240 hour(s)).   Labs: Basic Metabolic Panel:  Recent Labs Lab 08/31/16 1320 08/31/16 1938 09/01/16 0719 09/01/16 1649 09/02/16 0428  NA 141  --  142  --  139  K 3.4*  --  3.4* 3.5 3.8  CL 102  --  101  --  98*  CO2 30  --  32  --  32  GLUCOSE 121*  --  110*  --  115*  BUN 38*  --  31*  --  29*  CREATININE 1.75* 1.64* 1.48*  --  1.56*  CALCIUM 8.7*  --  9.0  --  9.0  MG  --  1.9  --  1.8 1.8   Liver Function Tests: No results for input(s): AST, ALT, ALKPHOS, BILITOT, PROT, ALBUMIN in the last  168 hours. No results for input(s): LIPASE, AMYLASE in the last 168 hours. No results for input(s): AMMONIA in the last 168 hours. CBC:  Recent Labs Lab 08/31/16 1320 08/31/16 1938 09/01/16 0719 09/02/16 0428  WBC 11.8* 13.7* 11.7* 10.5  HGB 8.2* 9.0* 8.4* 8.7*  HCT 27.5* 30.5* 28.6* 29.2*  MCV 89.3 89.4 89.9 91.5  PLT 386 456* 413* 395   Cardiac Enzymes: No results for input(s): CKTOTAL, CKMB, CKMBINDEX, TROPONINI in the last 168 hours. BNP: BNP (last 3 results)  Recent Labs  05/07/16 0950 08/18/16 1058 08/31/16 1314  BNP 1,301.2* 1,191.6* 2,085.6*    ProBNP (last 3 results) No results for input(s): PROBNP in the last 8760 hours.  CBG:  Recent Labs Lab 09/01/16 1143 09/01/16 1653 09/01/16 2109 09/02/16 0737 09/02/16 1124  GLUCAP 187* 147* 142* 127* 147*       Signed:  Cristal Ford  [facility] 09/02/2016, 3:44 PM

## 2016-09-02 NOTE — Evaluation (Addendum)
Occupational Therapy Evaluation Patient Details Name: Mark Rivers MRN: [id number] DOB: 05-17-1934 Today's Date: 09/02/2016    History of Present Illness Mark Rivers is an 82 y.o. male with a medical history of chronic diastolic heart failure, coronary artery disease, on chronic hypoxia on 3 L, esophageal adenocarcinoma recently diagnosed, who presented to the emergency department with complaints of shortness of breath and weakness. Patient had fall at home.   Clinical Impression   Pt admitted with above. Pt independent with ADLs, PTA. Feel pt will benefit from acute OT to reinforce energy conservation and safety prior to d/c home.     Follow Up Recommendations  Home health OT    Equipment Recommendations   (long handled sponge)    Recommendations for Other Services       Precautions / Restrictions Precautions Precautions: Fall Restrictions Weight Bearing Restrictions: No      Mobility Bed Mobility Overal bed mobility: Independent (supine to sit)                Transfers Overall transfer level: Modified independent               General transfer comment: OT managed O2 line but feel pt could do so.    Balance Overall balance assessment: History of Falls    Support required with single leg stance during simulation of functional activity.                                      ADL either performed or assessed with clinical judgement   ADL Overall ADL's : Needs assistance/impaired     Grooming: Wash/dry hands;Supervision/safety;Standing               Lower Body Dressing: Set up;Supervision/safety;Sit to/from stand   Toilet Transfer: Supervision/safety;Ambulation   Toileting- Clothing Manipulation and Hygiene: Set up;Supervision/safety (standing-used urinal)       Functional mobility during ADLs: Supervision/safety General ADL Comments: Educated on safety. Educated on energy conservation techniques. Talked about AE.      Vision         Perception     Praxis      Pertinent Vitals/Pain Pain Assessment: No/denies pain     Hand Dominance     Extremity/Trunk Assessment Upper Extremity Assessment Upper Extremity Assessment: Overall WFL for tasks assessed   Lower Extremity Assessment Lower Extremity Assessment: Defer to PT evaluation       Communication Communication Communication: HOH   Cognition Arousal/Alertness: Awake/alert Behavior During Therapy: WFL for tasks assessed/performed Overall Cognitive Status: Within Functional Limits for tasks assessed                                     General Comments       Exercises     Shoulder Instructions      Home Living Family/patient expects to be discharged to:: Private residence Living Arrangements: Alone Available Help at Discharge: Available PRN/intermittently Type of Home: House Home Access: Level entry     Home Layout: One level     Bathroom Shower/Tub: Walk-in shower         Home Equipment: Environmental consultant - 2 wheels;Shower seat - built in;Other (comment);Adaptive equipment (home O2 and pulse oximeter) Adaptive Equipment: Sock aid        Prior Functioning/Environment Level of Independence: Independent with assistive device(s)  Comments: unsure of accuracy of whether he was using RW or not?        OT Problem List: Decreased strength;Decreased range of motion;Decreased activity tolerance;Impaired balance (sitting and/or standing);Decreased knowledge of use of DME or AE;Cardiopulmonary status limiting activity      OT Treatment/Interventions: Self-care/ADL training;DME and/or AE instruction;Energy conservation;Therapeutic activities;Patient/family education;Balance training    OT Goals(Current goals can be found in the care plan section) Acute Rehab OT Goals Patient Stated Goal: not stated OT Goal Formulation: With patient Time For Goal Achievement: 09/09/16 Potential to Achieve Goals: Good ADL  Goals Additional ADL Goal #1: Pt will independently verbalize 3 energy conservation techniques and utilize during session as needed. Additional ADL Goal #2: Pt will independently verbalize 3 safety strategies for home.  OT Frequency: Min 2X/week   Barriers to D/C:            Co-evaluation              AM-PAC PT "6 Clicks" Daily Activity     Outcome Measure Help from another person eating meals?: None Help from another person taking care of personal grooming?: A Little Help from another person toileting, which includes using toliet, bedpan, or urinal?: A Little Help from another person bathing (including washing, rinsing, drying)?: A Little Help from another person to put on and taking off regular upper body clothing?: A Little Help from another person to put on and taking off regular lower body clothing?: A Little 6 Click Score: 19   End of Session Equipment Utilized During Treatment: Oxygen;Gait belt  Activity Tolerance: Patient tolerated treatment well Patient left: in bed;with call bell/phone within reach  OT Visit Diagnosis: Other (comment) (decreased activity tolerance)                Time: 6734-1937 OT Time Calculation (min): 21 min Charges:  OT General Charges $OT Visit: 1 Procedure OT Evaluation $OT Eval Moderate Complexity: 1 Procedure G-Codes:     Benito Mccreedy OTR/L 09/02/2016, 12:57 PM

## 2016-09-04 LAB — TYPE AND SCREEN
ABO/RH(D): O POS
ANTIBODY SCREEN: POSITIVE
DAT, IGG: NEGATIVE
UNIT DIVISION: 0
Unit division: 0
Unit division: 0

## 2016-09-04 LAB — BPAM RBC
Blood Product Expiration Date: 201809242359
Blood Product Expiration Date: 201809242359
Blood Product Expiration Date: 201809252359
ISSUE DATE / TIME: 201808221138
ISSUE DATE / TIME: 201808241435
UNIT TYPE AND RH: 5100
UNIT TYPE AND RH: 5100
Unit Type and Rh: 5100

## 2016-09-07 ENCOUNTER — Other Ambulatory Visit: Payer: Medicare Other

## 2016-09-08 NOTE — Telephone Encounter (Signed)
LM x 1 

## 2016-09-15 ENCOUNTER — Encounter (HOSPITAL_BASED_OUTPATIENT_CLINIC_OR_DEPARTMENT_OTHER): Payer: Medicare Other

## 2016-09-15 ENCOUNTER — Ambulatory Visit (HOSPITAL_BASED_OUTPATIENT_CLINIC_OR_DEPARTMENT_OTHER): Payer: Medicare Other | Attending: Pulmonary Disease

## 2016-09-18 ENCOUNTER — Ambulatory Visit: Payer: Medicare Other | Admitting: Cardiovascular Disease

## 2016-09-21 ENCOUNTER — Encounter (HOSPITAL_COMMUNITY): Payer: Self-pay | Admitting: Emergency Medicine

## 2016-09-21 ENCOUNTER — Emergency Department (HOSPITAL_COMMUNITY): Payer: Medicare Other

## 2016-09-21 ENCOUNTER — Inpatient Hospital Stay (HOSPITAL_COMMUNITY)
Admission: EM | Admit: 2016-09-21 | Discharge: 2016-10-05 | DRG: 291 | Disposition: A | Discharge status: Skilled Nursing Facility | Payer: Medicare Other | Attending: Family Medicine | Admitting: Family Medicine

## 2016-09-21 DIAGNOSIS — E1122 Type 2 diabetes mellitus with diabetic chronic kidney disease: Secondary | ICD-10-CM | POA: Diagnosis present

## 2016-09-21 DIAGNOSIS — Z9841 Cataract extraction status, right eye: Secondary | ICD-10-CM

## 2016-09-21 DIAGNOSIS — J431 Panlobular emphysema: Secondary | ICD-10-CM | POA: Diagnosis present

## 2016-09-21 DIAGNOSIS — I509 Heart failure, unspecified: Secondary | ICD-10-CM

## 2016-09-21 DIAGNOSIS — R7989 Other specified abnormal findings of blood chemistry: Secondary | ICD-10-CM

## 2016-09-21 DIAGNOSIS — I481 Persistent atrial fibrillation: Secondary | ICD-10-CM | POA: Diagnosis present

## 2016-09-21 DIAGNOSIS — C159 Malignant neoplasm of esophagus, unspecified: Secondary | ICD-10-CM | POA: Diagnosis present

## 2016-09-21 DIAGNOSIS — E785 Hyperlipidemia, unspecified: Secondary | ICD-10-CM | POA: Diagnosis present

## 2016-09-21 DIAGNOSIS — Z9981 Dependence on supplemental oxygen: Secondary | ICD-10-CM

## 2016-09-21 DIAGNOSIS — R0602 Shortness of breath: Secondary | ICD-10-CM | POA: Diagnosis not present

## 2016-09-21 DIAGNOSIS — I4891 Unspecified atrial fibrillation: Secondary | ICD-10-CM

## 2016-09-21 DIAGNOSIS — R945 Abnormal results of liver function studies: Secondary | ICD-10-CM

## 2016-09-21 DIAGNOSIS — R404 Transient alteration of awareness: Secondary | ICD-10-CM

## 2016-09-21 DIAGNOSIS — E871 Hypo-osmolality and hyponatremia: Secondary | ICD-10-CM | POA: Diagnosis present

## 2016-09-21 DIAGNOSIS — Z961 Presence of intraocular lens: Secondary | ICD-10-CM | POA: Diagnosis present

## 2016-09-21 DIAGNOSIS — Z8673 Personal history of transient ischemic attack (TIA), and cerebral infarction without residual deficits: Secondary | ICD-10-CM

## 2016-09-21 DIAGNOSIS — G9341 Metabolic encephalopathy: Secondary | ICD-10-CM | POA: Diagnosis not present

## 2016-09-21 DIAGNOSIS — I5033 Acute on chronic diastolic (congestive) heart failure: Secondary | ICD-10-CM | POA: Diagnosis present

## 2016-09-21 DIAGNOSIS — Z880 Allergy status to penicillin: Secondary | ICD-10-CM

## 2016-09-21 DIAGNOSIS — N183 Chronic kidney disease, stage 3 (moderate): Secondary | ICD-10-CM

## 2016-09-21 DIAGNOSIS — Z515 Encounter for palliative care: Secondary | ICD-10-CM

## 2016-09-21 DIAGNOSIS — R54 Age-related physical debility: Secondary | ICD-10-CM | POA: Diagnosis present

## 2016-09-21 DIAGNOSIS — Z951 Presence of aortocoronary bypass graft: Secondary | ICD-10-CM

## 2016-09-21 DIAGNOSIS — E876 Hypokalemia: Secondary | ICD-10-CM | POA: Diagnosis not present

## 2016-09-21 DIAGNOSIS — Z881 Allergy status to other antibiotic agents status: Secondary | ICD-10-CM

## 2016-09-21 DIAGNOSIS — J9621 Acute and chronic respiratory failure with hypoxia: Secondary | ICD-10-CM | POA: Diagnosis present

## 2016-09-21 DIAGNOSIS — I4819 Other persistent atrial fibrillation: Secondary | ICD-10-CM | POA: Diagnosis present

## 2016-09-21 DIAGNOSIS — I482 Chronic atrial fibrillation: Secondary | ICD-10-CM | POA: Diagnosis present

## 2016-09-21 DIAGNOSIS — N184 Chronic kidney disease, stage 4 (severe): Secondary | ICD-10-CM | POA: Diagnosis present

## 2016-09-21 DIAGNOSIS — N179 Acute kidney failure, unspecified: Secondary | ICD-10-CM | POA: Diagnosis present

## 2016-09-21 DIAGNOSIS — Z66 Do not resuscitate: Secondary | ICD-10-CM | POA: Diagnosis not present

## 2016-09-21 DIAGNOSIS — R0902 Hypoxemia: Secondary | ICD-10-CM

## 2016-09-21 DIAGNOSIS — I2510 Atherosclerotic heart disease of native coronary artery without angina pectoris: Secondary | ICD-10-CM | POA: Diagnosis present

## 2016-09-21 DIAGNOSIS — D638 Anemia in other chronic diseases classified elsewhere: Secondary | ICD-10-CM | POA: Diagnosis present

## 2016-09-21 DIAGNOSIS — I5043 Acute on chronic combined systolic (congestive) and diastolic (congestive) heart failure: Secondary | ICD-10-CM | POA: Diagnosis present

## 2016-09-21 DIAGNOSIS — Z833 Family history of diabetes mellitus: Secondary | ICD-10-CM

## 2016-09-21 DIAGNOSIS — Z794 Long term (current) use of insulin: Secondary | ICD-10-CM

## 2016-09-21 DIAGNOSIS — I480 Paroxysmal atrial fibrillation: Secondary | ICD-10-CM | POA: Diagnosis present

## 2016-09-21 DIAGNOSIS — I130 Hypertensive heart and chronic kidney disease with heart failure and stage 1 through stage 4 chronic kidney disease, or unspecified chronic kidney disease: Principal | ICD-10-CM | POA: Diagnosis present

## 2016-09-21 DIAGNOSIS — Z7189 Other specified counseling: Secondary | ICD-10-CM

## 2016-09-21 DIAGNOSIS — Z6829 Body mass index (BMI) 29.0-29.9, adult: Secondary | ICD-10-CM

## 2016-09-21 DIAGNOSIS — M25519 Pain in unspecified shoulder: Secondary | ICD-10-CM

## 2016-09-21 DIAGNOSIS — K219 Gastro-esophageal reflux disease without esophagitis: Secondary | ICD-10-CM | POA: Diagnosis present

## 2016-09-21 DIAGNOSIS — Z9842 Cataract extraction status, left eye: Secondary | ICD-10-CM

## 2016-09-21 DIAGNOSIS — R627 Adult failure to thrive: Secondary | ICD-10-CM | POA: Diagnosis present

## 2016-09-21 DIAGNOSIS — E1140 Type 2 diabetes mellitus with diabetic neuropathy, unspecified: Secondary | ICD-10-CM | POA: Diagnosis present

## 2016-09-21 LAB — BASIC METABOLIC PANEL
ANION GAP: 10 (ref 5–15)
BUN: 43 mg/dL — ABNORMAL HIGH (ref 6–20)
CALCIUM: 8.4 mg/dL — AB (ref 8.9–10.3)
CO2: 27 mmol/L (ref 22–32)
Chloride: 96 mmol/L — ABNORMAL LOW (ref 101–111)
Creatinine, Ser: 1.83 mg/dL — ABNORMAL HIGH (ref 0.61–1.24)
GFR, EST AFRICAN AMERICAN: 38 mL/min — AB (ref >60)
GFR, EST NON AFRICAN AMERICAN: 33 mL/min — AB (ref >60)
Glucose, Bld: 115 mg/dL — ABNORMAL HIGH (ref 65–99)
Potassium: 4.3 mmol/L (ref 3.5–5.1)
SODIUM: 133 mmol/L — AB (ref 135–145)

## 2016-09-21 LAB — CBC
HCT: 29.4 % — ABNORMAL LOW (ref 39.0–52.0)
Hemoglobin: 8.8 g/dL — ABNORMAL LOW (ref 13.0–17.0)
MCH: 26.2 pg (ref 26.0–34.0)
MCHC: 29.9 g/dL — AB (ref 30.0–36.0)
MCV: 87.5 fL (ref 78.0–100.0)
Platelets: 402 10*3/uL — ABNORMAL HIGH (ref 150–400)
RBC: 3.36 MIL/uL — ABNORMAL LOW (ref 4.22–5.81)
RDW: 17.5 % — AB (ref 11.5–15.5)
WBC: 17.9 10*3/uL — ABNORMAL HIGH (ref 4.0–10.5)

## 2016-09-21 LAB — BRAIN NATRIURETIC PEPTIDE: B NATRIURETIC PEPTIDE 5: 1875.7 pg/mL — AB (ref 0.0–100.0)

## 2016-09-21 LAB — I-STAT TROPONIN, ED
Troponin i, poc: 0.08 ng/mL (ref 0.00–0.08)
Troponin i, poc: 0.05 ng/mL (ref 0.00–0.08)

## 2016-09-21 LAB — MAGNESIUM: MAGNESIUM: 1.9 mg/dL (ref 1.7–2.4)

## 2016-09-21 MED ORDER — ACETAMINOPHEN 500 MG PO TABS
1000.0000 mg | ORAL_TABLET | Freq: Once | ORAL | Status: AC
  Administered 2016-09-21: 1000 mg via ORAL
  Filled 2016-09-21: qty 2

## 2016-09-21 MED ORDER — FENTANYL CITRATE (PF) 100 MCG/2ML IJ SOLN
12.5000 ug | Freq: Once | INTRAMUSCULAR | Status: AC
  Administered 2016-09-21: 12.5 ug via INTRAVENOUS
  Filled 2016-09-21: qty 2

## 2016-09-21 MED ORDER — METOPROLOL TARTRATE 5 MG/5ML IV SOLN
5.0000 mg | Freq: Once | INTRAVENOUS | Status: AC
  Administered 2016-09-21: 5 mg via INTRAVENOUS

## 2016-09-21 MED ORDER — METOPROLOL TARTRATE 5 MG/5ML IV SOLN
INTRAVENOUS | Status: AC
  Filled 2016-09-21: qty 5

## 2016-09-21 NOTE — Telephone Encounter (Signed)
Called spoke with patient who reported he was discharged on O2 on 8.25.18 2lpm and has been wearing this continuously.  Advised patient to continue these same settings and to please contact the office with any questions/concerns.  Pt voiced his understanding Nothing further needed; will sign off

## 2016-09-21 NOTE — ED Triage Notes (Signed)
Per ems, called out by home health nurse for swelling in legs and belly, pt c/o chronic bilateral shoulder pain, pt HR per ems is afib rvr, pt not on any blood thinners. Pt arouses on voice and is oriented. On 2L home o2 at all times for CHF. Pt has pitting edema to legs.

## 2016-09-21 NOTE — ED Provider Notes (Signed)
[facility] DEPT Provider Note   CSN: [id number] Arrival date & time: 09/21/16  1752   History   Chief Complaint Chief Complaint  Patient presents with  . Tachycardia    HPI Mark Rivers is a 82 y.o. male.  This is a 82 year old male with PMH of CHF last EF 55% in April 2018, atrial fibrillation, CKD stage III, T2 DM, HTN, HLD, CAD status post CABG who presents with worsening shortness of breath at home approximately an hour prior.  Patient lives alone but has a home health nurse that checks on him daily.  He states he told a home health nurse he has had increasing shortness of breath and leg swelling throughout the morning and the nurse agreed EMS should be called for transportation to the hospital.  He denies any chest pain or palpitations, change in vision, numbness or tingling his extremities.  Denies any change in ambulation but is more tired and stated he has not been able to sleep at night as well due to shortness of breath.  Patient was recently discharged from the hospital approximately 2 weeks prior for acute diastolic heart failure exacerbation.  He is on 2.5 L at home of nasal cannula, his chads vasc score is 8 however he is not on any anticoagulation due to history of GI bleed in March 2018 and his aspirin was recently discontinued per cardiology approximately 3 weeks prior.  The patient was apparently placed on amiodarone with a follow-up plan for cardioversion in a few weeks along with rate control with metoprolol, however amiodarone was not on the patient's home med list.   The history is provided by the patient and medical records.   Past Medical History:  Diagnosis Date  . Allergy   . Back pain   . Chronic diastolic CHF (congestive heart failure) ([place]) 12/21/2015   A. Echo 10/17: Moderate LVH, EF 55-60, normal wall motion, grade 2 diastolic dysfunction, mild aortic stenosis (mean 14, peak 34), MAC, mild LAE    . Chronic respiratory failure ([place])   . CKD (chronic  kidney disease) stage 3, GFR 30-59 ml/min 03/04/2014  . Coronary artery disease    a. s/p remote POBA in 1988 // b. NSTEMI in 10/17 >> LHC with 99% LM stenosis >> emergent CABG (L-LAD, S-OM2, S-PDA) - post op course complicated (prolonged ventilation, s/p Trach, AFib, anemia req transfusion, bacteremia >> DC to LTAC)  . Diabetes mellitus   . Difficult intubation    difficult airway/FYI note 11/01/2015  . Esophageal adenocarcinoma ([place])    a. found by EGD 03/2016..  . GERD (gastroesophageal reflux disease)   . GI bleed 01/2016   a. 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 F/u EGD with esophageal adenocarcinoma 03/2016.  [name] History of nuclear stress test    a. Myoview 6/14 - EF 51, no ischemia or scar; Low Risk  . Hyperlipemia   . Hypertension   . Methicillin resistant Staphylococcus epidermidis infection 11/2015   a. during adm for CABG.  . Neuropathy   . On home oxygen therapy    "2L; 24/7" (02/09/2016)  . Persistent atrial fibrillation ([place]) 10/25/2015  . Sinus bradycardia    a. Prior HR 54 when in sinus rhythm 11/2015.  . Stroke [facility])    a. old L pontine infarcts seen on CT 04/2016.    Patient Active Problem List  Diagnosis Date Noted  . Hypoxemia 06/21/2016  . Physical deconditioning 06/21/2016  . Hypokalemia 05/08/2016  . Vertigo   . Acute on chronic respiratory failure ([place]) 05/07/2016  . 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 Malignant neoplasm of stomach ([place]) 02/22/2016  . Upper GI bleed   . Duodenitis determined by biopsy   . Panlobular emphysema ([place])   . Essential hypertension   . Controlled diabetes mellitus type 2 with complications ([place])   . Influenza A 02/11/2016  . Duodenitis:Per EGD 02/11/2016 02/11/2016  . Warfarin-induced coagulopathy ([place])   . Malnutrition of moderate degree 02/10/2016  .  Symptomatic anemia 02/09/2016  . GI bleed 02/09/2016  . Dyspnea on exertion 01/09/2016  . Monitoring for long-term anticoagulant use 12/24/2015  . Acute on chronic diastolic heart failure ([place]) 12/21/2015  . Pressure injury of skin 11/17/2015  . Surgery, elective   . Coronary artery disease involving native coronary artery of native heart without angina pectoris 10/27/2015  . S/P CABG x 3   . History of MI (myocardial infarction)   . Persistent atrial fibrillation ([place]) 10/25/2015  . Seasonal allergic conjunctivitis 08/25/2015  . History of tobacco abuse 08/25/2015  . Diabetes mellitus ([place]) 03/11/2015  . Diabetes mellitus with stage 3 chronic kidney disease ([place]) 03/11/2015  . Metabolic bone disease 96/22/2979  . Vitamin D deficiency 03/11/2015  . Atopic dermatitis 02/24/2015  . Hip pain 09/24/2014  . Need for immunization against influenza 09/24/2014  . Benign essential hypertension 03/13/2014  . Generalized osteoarthritis of multiple sites 03/13/2014  . CKD (chronic kidney disease) stage 3, GFR 30-59 ml/min 03/04/2014  . Acute stress disorder 11/12/2013  . Seasonal and perennial allergic rhinitis 11/12/2013  . Allergic urticaria 11/12/2013  . Benign neoplasm of colon 11/12/2013  . Causalgia of upper extremity 11/12/2013  . Cervical radiculopathy 11/12/2013  . Neck pain 11/12/2013  . Chronic fatigue 11/12/2013  . Hearing loss 11/12/2013  . Insomnia 11/12/2013  . Obesity 11/12/2013  . Premature ventricular contractions 11/12/2013  . Psychosexual dysfunction with inhibited sexual excitement 11/12/2013  . Hyperlipemia 09/11/2013  . Type 2 diabetes mellitus, uncontrolled ([place]) 09/11/2013  . Pulmonary emphysema ([place]) 04/22/2013  . Esophageal reflux   . Benign hypertension with chronic kidney disease, stage III   . Allergy   . Neuropathy [facility])     Past Surgical History:  Procedure Laterality Date  . CARDIAC CATHETERIZATION N/A 10/26/2015   Procedure: Left Heart Cath and  Coronary Angiography;  Surgeon: Troy Sine, MD;  Location: [place] CV LAB;  Service: Cardiovascular;  Laterality: N/A;  . CATARACT EXTRACTION W/ INTRAOCULAR LENS  IMPLANT, BILATERAL  2008  . COLONOSCOPY    . CORONARY ANGIOPLASTY WITH STENT PLACEMENT  1988  . CORONARY ARTERY BYPASS GRAFT N/A 10/26/2015   Procedure: CORONARY ARTERY BYPASS GRAFTING (CABG) x3(LIMA to LA, SVG to OM1, SVG to PDA) with EVH from the left thigh and partial lower leg greater saphenous vein and left internal mammary artery;  Surgeon: Ivin Poot, MD;  Location: [place];  Service: Open Heart Surgery;  Laterality: N/A;  . ESOPHAGOGASTRODUODENOSCOPY (EGD) WITH PROPOFOL N/A 02/11/2016   Procedure: ESOPHAGOGASTRODUODENOSCOPY (EGD) WITH PROPOFOL;  Surgeon: Ladene Artist, MD;  Location: [facility] ENDOSCOPY;  Service: Endoscopy;  Laterality: N/A;  . EUS N/A 03/09/2016   Procedure: UPPER ENDOSCOPIC ULTRASOUND (EUS) RADIAL;  Surgeon: Milus Banister, MD;  Location: [facility] ENDOSCOPY;  Service: Endoscopy;  Laterality: N/A;  . TEE WITHOUT CARDIOVERSION N/A 10/26/2015   Procedure: TRANSESOPHAGEAL ECHOCARDIOGRAM (TEE);  Surgeon: Ivin Poot, MD;  Location: [place] [facility];  Service: Open Heart Surgery;  Laterality: N/A;  . TONSILLECTOMY         Home Medications    Prior to Admission medications   Medication Sig Start Date End Date Taking? Authorizing Provider  atorvastatin (LIPITOR) 80 MG tablet Take 1 tablet (80 mg total) by mouth daily. 03/31/16  Yes Josue Hector, MD  cholecalciferol (VITAMIN D) 1000 units tablet Take 1,000 Units by mouth daily.   Yes [provider]  diphenhydramine-acetaminophen (TYLENOL PM) 25-500 MG TABS tablet Take 2 tablets by mouth at bedtime.   Yes [provider]  fenofibrate 54 MG tablet Take 54 mg by mouth daily.   Yes [provider]  furosemide (LASIX) 40 MG tablet Take 1.5 tablets (60 mg total) by mouth 2 (two) times daily. 08/24/16 08/24/17 Yes Josue Hector, MD    Glucosamine-Chondroit-Vit C-Mn (GLUCOSAMINE CHONDR 1500 COMPLX PO) Take 2 tablets by mouth daily.    Yes [provider]  Insulin Glargine (TOUJEO SOLOSTAR) 300 UNIT/ML SOPN Inject 30 Units into the skin at bedtime.   Yes [provider]  metoprolol succinate (TOPROL-XL) 50 MG 24 hr tablet Take 1 tablet (50 mg total) by mouth daily. Take with or immediately following a meal. 02/16/16 09/21/16 Yes Weaver, Scott T, PA-C  OXYGEN Inhale 2 L/min into the lungs continuous.   Yes [provider]  pantoprazole (PROTONIX) 40 MG tablet Take 1 tablet (40 mg total) by mouth daily at 6 (six) AM. 02/14/16  Yes Allie Bossier, MD  traMADol (ULTRAM) 50 MG tablet Take by mouth every 6 (six) hours as needed.   Yes [provider]  traZODone (DESYREL) 100 MG tablet Take 100 mg by mouth at bedtime.    Yes [provider]  vitamin C (ASCORBIC ACID) 500 MG tablet Take 500 mg by mouth daily.   Yes [provider]    Family History Family History  Problem Relation Age of Onset  . Diabetes Father   . Colon cancer Neg Hx   . Esophageal cancer Neg Hx   . Stomach cancer Neg Hx   . Rectal cancer Neg Hx   . Allergic rhinitis Neg Hx   . Angioedema Neg Hx   . Asthma Neg Hx   . Eczema Neg Hx   . Immunodeficiency Neg Hx   . Urticaria Neg Hx   . Liver disease Neg Hx     Social History Social History  Substance Use Topics  . Smoking status: Former Smoker    Packs/day: 2.00    Years: 30.00    Types: Cigarettes    Quit date: 06/28/2004  . Smokeless tobacco: Never Used  . Alcohol use No     Comment: QUIT 06/28/2004     Allergies   Amoxicillin; Cephalexin; and Penicillin g   Review of Systems Review of Systems  Constitutional: Positive for fatigue. Negative for chills, diaphoresis and fever.  HENT: Negative for ear pain and sore throat.   Eyes: Negative for pain and visual disturbance.  Respiratory: Positive for chest tightness and shortness of breath.  Negative for cough and wheezing.   Cardiovascular: Positive for leg swelling. Negative for chest pain and palpitations.  Gastrointestinal: Negative for abdominal pain, constipation, diarrhea, nausea and vomiting.  Genitourinary: Negative for dysuria and hematuria.  Musculoskeletal: Negative for arthralgias and back pain.  Skin: Negative for color change and rash.  Neurological: Negative for seizures and syncope.  All other systems  reviewed and are negative.    Physical Exam Updated Vital Signs BP 135/78   Pulse (!) 144   Temp 98.3 F (36.8 C) (Oral)   Resp 19   Ht 5' 7"  (1.702 m)   Wt 90.3 kg (199 lb)   SpO2 96%   BMI 31.17 kg/m   Physical Exam  Constitutional: He is oriented to person, place, and time. He appears well-nourished. No distress.  HENT:  Head: Normocephalic and atraumatic.  Eyes: Pupils are equal, round, and reactive to light. Conjunctivae are normal.  Neck: Neck supple.  Cardiovascular: Intact distal pulses.  An irregularly irregular rhythm present. Tachycardia present.   No murmur heard. Pulmonary/Chest: Effort normal. No respiratory distress. He has rales in the right middle field and the left middle field.  Abdominal: Soft. There is no tenderness.  Musculoskeletal:       Right ankle: He exhibits swelling.       Left ankle: He exhibits swelling.       Right lower leg: He exhibits edema.       Left lower leg: He exhibits edema.       Right foot: There is swelling.       Left foot: There is swelling.  2+ pitting edema of bilateral lower extremities up to the knees.  Neurological: He is alert and oriented to person, place, and time. He has normal strength. He displays no tremor. No cranial nerve deficit or sensory deficit. He exhibits normal muscle tone. Coordination normal.  Skin: Skin is warm and dry. He is not diaphoretic.  Nursing note and vitals reviewed.    ED Treatments / Results  Labs (all labs ordered are listed, but only abnormal results are  displayed) Labs Reviewed  BASIC METABOLIC PANEL - Abnormal; Notable for the following:       Result Value   Sodium 133 (*)    Chloride 96 (*)    Glucose, Bld 115 (*)    BUN 43 (*)    Creatinine, Ser 1.83 (*)    Calcium 8.4 (*)    GFR calc non Af Amer 33 (*)    GFR calc Af Amer 38 (*)    All other components within normal limits  CBC - Abnormal; Notable for the following:    WBC 17.9 (*)    RBC 3.36 (*)    Hemoglobin 8.8 (*)    HCT 29.4 (*)    MCHC 29.9 (*)    RDW 17.5 (*)    Platelets 402 (*)    All other components within normal limits  BRAIN NATRIURETIC PEPTIDE - Abnormal; Notable for the following:    B Natriuretic Peptide 1,875.7 (*)    All other components within normal limits  MAGNESIUM  I-STAT TROPONIN, ED  I-STAT TROPONIN, ED    EKG  EKG Interpretation  Date/Time:  Thursday September 21 2016 18:00:12 EDT Ventricular Rate:  145 PR Interval:    QRS Duration: 90 QT Interval:  315 QTC Calculation: 490 R Axis:   79 Text Interpretation:  Atrial fibrillation with rapid V-rate Low voltage, extremity leads Probable anteroseptal infarct, old Nonspecific T abnormalities, lateral leads Abnormal ekg Confirmed by Carmin Muskrat [phone]) on 09/21/2016 6:25:21 PM       Radiology Dg Chest Portable 1 View  Result Date: 09/21/2016 CLINICAL DATA:  Swelling of the legs and abdomen. Atrial fibrillation with rapid ventricular response. EXAM: PORTABLE CHEST 1 VIEW COMPARISON:  09/02/2016 FINDINGS: Stable cardiomegaly with median sternotomy sutures. Aortic atherosclerosis is again noted at  the arch. Mild central vascular congestion consistent with CHF. Probable small pleural effusions blunting the costophrenic angles. No acute nor suspicious osseous abnormalities. IMPRESSION: Stable cardiomegaly with mild CHF. Trace bilateral pleural effusions believed to account for blunting of the costophrenic angles. Aortic atherosclerosis. Electronically Signed   By: Ashley Royalty M.D.   On:  09/21/2016 18:43    Procedures Procedures (including critical care time)  Medications Ordered in ED Medications  furosemide (LASIX) injection 80 mg (not administered)  metoprolol tartrate (LOPRESSOR) injection 5 mg (5 mg Intravenous Given 09/21/16 1829)  acetaminophen (TYLENOL) tablet 1,000 mg (1,000 mg Oral Given 09/21/16 2041)  fentaNYL (SUBLIMAZE) injection 12.5 mcg (12.5 mcg Intravenous Given 09/21/16 2201)     Initial Impression / Assessment and Plan / ED Course  I have reviewed the triage vital signs and the nursing notes.  Pertinent labs & imaging results that were available during my care of the patient were reviewed by me and considered in my medical decision making (see chart for details).     This is a 82 year old male with PMH of CHF last EF 55% in April 2018, atrial fibrillation, CKD stage III, T2 DM, HTN, HLD, CAD status post CABG who presents with worsening shortness of breath at home approximately an hour prior.    Patient has no neurological complaints, neuro exam unremarkable as documented above.  2+ pitting edema noted. Initial EKG reviewed and displayed atrial fibrillation with RVR and heart rate in the 150s-160s.  Patient given 5 mg IV metoprolol and responded well.  Laboratory studies reviewed.  Patient has WBC count 17.9, initial troponin 0.08. CXR shows pulmonary edema with small left pleural effusion and stable cardiomegaly.  BNP 1,800. Patient given IV Lasix dose 80 mg.  Given patient's atrial fibrillation with RVR, lack of anticoagulation, concern for heart failure exacerbation, patient's admission discussed with Hospitalist team. Admisison agreed upon.  Final Clinical Impressions(s) / ED Diagnoses   Final diagnoses:  Atrial fibrillation with RVR (HCC)  Shortness of breath  Acute on chronic diastolic congestive heart failure [facility])    New Prescriptions New Prescriptions   No medications on file     Aldona Lento, MD 09/22/16 2633      Carmin Muskrat, MD 09/25/16 [phone]

## 2016-09-21 NOTE — ED Notes (Signed)
Spoke with daughter in [place] regarding patient in pain. Daughter upset that pt has not been medicated to this point. Daughter verbally aggressive on phone, I explained that I could not answer her question with her talking aggressivly on phone or allowing me time to answer. I explained that the secretary (who is not licensed to give medication) that received the call for pain meds relayed the message and that it had to be relayed to primary rn, to MD, MD order placed, [place] in pyxis and then medication given and that his happened within 15 mins and that that was reasonable time frame. Family stated ok and then said "well what is happening now" I explained that we are pending labs and then the MD will make a decision on disposition. Daughter stated she will call back in one hour and that she will call and administrator in the Morning to take care of situtation. Daughter refused to speak to anyone but to the Charge RN

## 2016-09-22 ENCOUNTER — Encounter (HOSPITAL_COMMUNITY): Payer: Self-pay

## 2016-09-22 ENCOUNTER — Inpatient Hospital Stay (HOSPITAL_COMMUNITY): Payer: Medicare Other

## 2016-09-22 DIAGNOSIS — I480 Paroxysmal atrial fibrillation: Secondary | ICD-10-CM | POA: Diagnosis present

## 2016-09-22 DIAGNOSIS — N179 Acute kidney failure, unspecified: Secondary | ICD-10-CM | POA: Diagnosis not present

## 2016-09-22 DIAGNOSIS — Z9842 Cataract extraction status, left eye: Secondary | ICD-10-CM | POA: Diagnosis not present

## 2016-09-22 DIAGNOSIS — I361 Nonrheumatic tricuspid (valve) insufficiency: Secondary | ICD-10-CM | POA: Diagnosis not present

## 2016-09-22 DIAGNOSIS — N171 Acute kidney failure with acute cortical necrosis: Secondary | ICD-10-CM | POA: Diagnosis not present

## 2016-09-22 DIAGNOSIS — N184 Chronic kidney disease, stage 4 (severe): Secondary | ICD-10-CM | POA: Diagnosis not present

## 2016-09-22 DIAGNOSIS — Z833 Family history of diabetes mellitus: Secondary | ICD-10-CM | POA: Diagnosis not present

## 2016-09-22 DIAGNOSIS — I130 Hypertensive heart and chronic kidney disease with heart failure and stage 1 through stage 4 chronic kidney disease, or unspecified chronic kidney disease: Secondary | ICD-10-CM | POA: Diagnosis present

## 2016-09-22 DIAGNOSIS — J9621 Acute and chronic respiratory failure with hypoxia: Secondary | ICD-10-CM | POA: Diagnosis not present

## 2016-09-22 DIAGNOSIS — N189 Chronic kidney disease, unspecified: Secondary | ICD-10-CM | POA: Diagnosis not present

## 2016-09-22 DIAGNOSIS — I5033 Acute on chronic diastolic (congestive) heart failure: Secondary | ICD-10-CM

## 2016-09-22 DIAGNOSIS — G9341 Metabolic encephalopathy: Secondary | ICD-10-CM | POA: Diagnosis not present

## 2016-09-22 DIAGNOSIS — Z951 Presence of aortocoronary bypass graft: Secondary | ICD-10-CM | POA: Diagnosis not present

## 2016-09-22 DIAGNOSIS — I350 Nonrheumatic aortic (valve) stenosis: Secondary | ICD-10-CM | POA: Diagnosis not present

## 2016-09-22 DIAGNOSIS — I340 Nonrheumatic mitral (valve) insufficiency: Secondary | ICD-10-CM | POA: Diagnosis not present

## 2016-09-22 DIAGNOSIS — I481 Persistent atrial fibrillation: Secondary | ICD-10-CM | POA: Diagnosis not present

## 2016-09-22 DIAGNOSIS — D638 Anemia in other chronic diseases classified elsewhere: Secondary | ICD-10-CM

## 2016-09-22 DIAGNOSIS — R0602 Shortness of breath: Secondary | ICD-10-CM

## 2016-09-22 DIAGNOSIS — E871 Hypo-osmolality and hyponatremia: Secondary | ICD-10-CM | POA: Diagnosis present

## 2016-09-22 DIAGNOSIS — E1140 Type 2 diabetes mellitus with diabetic neuropathy, unspecified: Secondary | ICD-10-CM | POA: Diagnosis present

## 2016-09-22 DIAGNOSIS — Z881 Allergy status to other antibiotic agents status: Secondary | ICD-10-CM | POA: Diagnosis not present

## 2016-09-22 DIAGNOSIS — R609 Edema, unspecified: Secondary | ICD-10-CM | POA: Diagnosis not present

## 2016-09-22 DIAGNOSIS — Z515 Encounter for palliative care: Secondary | ICD-10-CM | POA: Diagnosis not present

## 2016-09-22 DIAGNOSIS — Z961 Presence of intraocular lens: Secondary | ICD-10-CM | POA: Diagnosis present

## 2016-09-22 DIAGNOSIS — Z880 Allergy status to penicillin: Secondary | ICD-10-CM | POA: Diagnosis not present

## 2016-09-22 DIAGNOSIS — Z9981 Dependence on supplemental oxygen: Secondary | ICD-10-CM | POA: Diagnosis not present

## 2016-09-22 DIAGNOSIS — I509 Heart failure, unspecified: Secondary | ICD-10-CM | POA: Diagnosis not present

## 2016-09-22 DIAGNOSIS — Z8673 Personal history of transient ischemic attack (TIA), and cerebral infarction without residual deficits: Secondary | ICD-10-CM | POA: Diagnosis not present

## 2016-09-22 DIAGNOSIS — I4891 Unspecified atrial fibrillation: Secondary | ICD-10-CM

## 2016-09-22 DIAGNOSIS — R410 Disorientation, unspecified: Secondary | ICD-10-CM | POA: Diagnosis not present

## 2016-09-22 DIAGNOSIS — I2510 Atherosclerotic heart disease of native coronary artery without angina pectoris: Secondary | ICD-10-CM | POA: Diagnosis present

## 2016-09-22 DIAGNOSIS — E876 Hypokalemia: Secondary | ICD-10-CM | POA: Diagnosis not present

## 2016-09-22 DIAGNOSIS — R945 Abnormal results of liver function studies: Secondary | ICD-10-CM | POA: Diagnosis not present

## 2016-09-22 DIAGNOSIS — E1122 Type 2 diabetes mellitus with diabetic chronic kidney disease: Secondary | ICD-10-CM | POA: Diagnosis not present

## 2016-09-22 DIAGNOSIS — R531 Weakness: Secondary | ICD-10-CM | POA: Diagnosis not present

## 2016-09-22 DIAGNOSIS — R7989 Other specified abnormal findings of blood chemistry: Secondary | ICD-10-CM | POA: Diagnosis not present

## 2016-09-22 DIAGNOSIS — Z7189 Other specified counseling: Secondary | ICD-10-CM | POA: Diagnosis not present

## 2016-09-22 DIAGNOSIS — E785 Hyperlipidemia, unspecified: Secondary | ICD-10-CM | POA: Diagnosis present

## 2016-09-22 DIAGNOSIS — Z66 Do not resuscitate: Secondary | ICD-10-CM | POA: Diagnosis not present

## 2016-09-22 DIAGNOSIS — R404 Transient alteration of awareness: Secondary | ICD-10-CM | POA: Diagnosis not present

## 2016-09-22 DIAGNOSIS — K219 Gastro-esophageal reflux disease without esophagitis: Secondary | ICD-10-CM | POA: Diagnosis present

## 2016-09-22 DIAGNOSIS — C159 Malignant neoplasm of esophagus, unspecified: Secondary | ICD-10-CM | POA: Diagnosis not present

## 2016-09-22 DIAGNOSIS — I5043 Acute on chronic combined systolic (congestive) and diastolic (congestive) heart failure: Secondary | ICD-10-CM | POA: Diagnosis not present

## 2016-09-22 DIAGNOSIS — N183 Chronic kidney disease, stage 3 (moderate): Secondary | ICD-10-CM | POA: Diagnosis not present

## 2016-09-22 DIAGNOSIS — Z9841 Cataract extraction status, right eye: Secondary | ICD-10-CM | POA: Diagnosis not present

## 2016-09-22 LAB — BASIC METABOLIC PANEL
Anion gap: 11 (ref 5–15)
BUN: 54 mg/dL — ABNORMAL HIGH (ref 6–20)
CHLORIDE: 94 mmol/L — AB (ref 101–111)
CO2: 26 mmol/L (ref 22–32)
CREATININE: 2.15 mg/dL — AB (ref 0.61–1.24)
Calcium: 8.1 mg/dL — ABNORMAL LOW (ref 8.9–10.3)
GFR, EST AFRICAN AMERICAN: 32 mL/min — AB (ref >60)
GFR, EST NON AFRICAN AMERICAN: 27 mL/min — AB (ref >60)
Glucose, Bld: 111 mg/dL — ABNORMAL HIGH (ref 65–99)
Potassium: 4 mmol/L (ref 3.5–5.1)
SODIUM: 131 mmol/L — AB (ref 135–145)

## 2016-09-22 LAB — URINALYSIS, ROUTINE W REFLEX MICROSCOPIC
BACTERIA UA: NONE SEEN
BILIRUBIN URINE: NEGATIVE
Glucose, UA: NEGATIVE mg/dL
Ketones, ur: NEGATIVE mg/dL
LEUKOCYTES UA: NEGATIVE
Nitrite: NEGATIVE
Protein, ur: 30 mg/dL — AB
SPECIFIC GRAVITY, URINE: 1.012 (ref 1.005–1.030)
SQUAMOUS EPITHELIAL / LPF: NONE SEEN
pH: 5 (ref 5.0–8.0)

## 2016-09-22 LAB — CBC WITH DIFFERENTIAL/PLATELET
Basophils Absolute: 0 10*3/uL (ref 0.0–0.1)
Basophils Relative: 0 %
EOS PCT: 0 %
Eosinophils Absolute: 0 10*3/uL (ref 0.0–0.7)
HEMATOCRIT: 28.6 % — AB (ref 39.0–52.0)
Hemoglobin: 8.7 g/dL — ABNORMAL LOW (ref 13.0–17.0)
LYMPHS ABS: 1.4 10*3/uL (ref 0.7–4.0)
Lymphocytes Relative: 6 %
MCH: 26.3 pg (ref 26.0–34.0)
MCHC: 30.4 g/dL (ref 30.0–36.0)
MCV: 86.4 fL (ref 78.0–100.0)
MONOS PCT: 7 %
Monocytes Absolute: 1.6 10*3/uL — ABNORMAL HIGH (ref 0.1–1.0)
NEUTROS PCT: 87 %
Neutro Abs: 18.6 10*3/uL — ABNORMAL HIGH (ref 1.7–7.7)
PLATELETS: 357 10*3/uL (ref 150–400)
RBC: 3.31 MIL/uL — ABNORMAL LOW (ref 4.22–5.81)
RDW: 17.4 % — AB (ref 11.5–15.5)
WBC: 21.6 10*3/uL — AB (ref 4.0–10.5)

## 2016-09-22 LAB — TROPONIN I
TROPONIN I: 0.21 ng/mL — AB (ref <0.03)
Troponin I: 0.21 ng/mL (ref <0.03)
Troponin I: 0.2 ng/mL (ref <0.03)

## 2016-09-22 LAB — URIC ACID: URIC ACID, SERUM: 11 mg/dL — AB (ref 4.4–7.6)

## 2016-09-22 LAB — LACTIC ACID, PLASMA
LACTIC ACID, VENOUS: 2.4 mmol/L — AB (ref 0.5–1.9)
Lactic Acid, Venous: 1.3 mmol/L (ref 0.5–1.9)

## 2016-09-22 LAB — CBG MONITORING, ED
Glucose-Capillary: 116 mg/dL — ABNORMAL HIGH (ref 65–99)
Glucose-Capillary: 121 mg/dL — ABNORMAL HIGH (ref 65–99)

## 2016-09-22 LAB — CREATININE, URINE, RANDOM: Creatinine, Urine: 89.86 mg/dL

## 2016-09-22 LAB — GLUCOSE, CAPILLARY
Glucose-Capillary: 128 mg/dL — ABNORMAL HIGH (ref 65–99)
Glucose-Capillary: 135 mg/dL — ABNORMAL HIGH (ref 65–99)
Glucose-Capillary: 160 mg/dL — ABNORMAL HIGH (ref 65–99)

## 2016-09-22 MED ORDER — DIPHENHYDRAMINE HCL 25 MG PO CAPS
50.0000 mg | ORAL_CAPSULE | Freq: Every day | ORAL | Status: DC
  Administered 2016-09-22 – 2016-09-25 (×5): 50 mg via ORAL
  Filled 2016-09-22 (×5): qty 2

## 2016-09-22 MED ORDER — VITAMIN C 500 MG PO TABS
500.0000 mg | ORAL_TABLET | Freq: Every day | ORAL | Status: DC
  Administered 2016-09-22 – 2016-10-05 (×14): 500 mg via ORAL
  Filled 2016-09-22 (×14): qty 1

## 2016-09-22 MED ORDER — METOPROLOL TARTRATE 5 MG/5ML IV SOLN: 2.5000 mg | INTRAVENOUS | Status: DC | PRN

## 2016-09-22 MED ORDER — ACETAMINOPHEN 500 MG PO TABS
1000.0000 mg | ORAL_TABLET | Freq: Every day | ORAL | Status: DC
  Administered 2016-09-22 – 2016-09-25 (×4): 1000 mg via ORAL
  Filled 2016-09-22 (×5): qty 2

## 2016-09-22 MED ORDER — TRAMADOL HCL 50 MG PO TABS
50.0000 mg | ORAL_TABLET | Freq: Four times a day (QID) | ORAL | Status: DC | PRN
Start: 2016-09-22 — End: 2016-10-03
  Administered 2016-09-22 – 2016-10-02 (×23): 50 mg via ORAL
  Filled 2016-09-22 (×24): qty 1

## 2016-09-22 MED ORDER — FENOFIBRATE 54 MG PO TABS
54.0000 mg | ORAL_TABLET | Freq: Every day | ORAL | Status: DC
  Administered 2016-09-23 – 2016-09-26 (×5): 54 mg via ORAL
  Filled 2016-09-22 (×6): qty 1

## 2016-09-22 MED ORDER — MAGNESIUM SULFATE IN D5W 1-5 GM/100ML-% IV SOLN
1.0000 g | Freq: Once | INTRAVENOUS | Status: AC
  Administered 2016-09-22: 1 g via INTRAVENOUS
  Filled 2016-09-22: qty 100

## 2016-09-22 MED ORDER — ONDANSETRON HCL 4 MG/2ML IJ SOLN: 4.0000 mg | Freq: Four times a day (QID) | INTRAMUSCULAR | Status: DC | PRN

## 2016-09-22 MED ORDER — FUROSEMIDE 10 MG/ML IJ SOLN
80.0000 mg | Freq: Once | INTRAMUSCULAR | Status: AC
  Administered 2016-09-22: 80 mg via INTRAVENOUS
  Filled 2016-09-22: qty 8

## 2016-09-22 MED ORDER — ENOXAPARIN SODIUM 40 MG/0.4ML ~~LOC~~ SOLN
40.0000 mg | Freq: Every day | SUBCUTANEOUS | Status: DC
  Administered 2016-09-22 – 2016-09-23 (×2): 40 mg via SUBCUTANEOUS
  Filled 2016-09-22 (×3): qty 0.4

## 2016-09-22 MED ORDER — ACETAMINOPHEN 325 MG PO TABS
650.0000 mg | ORAL_TABLET | ORAL | Status: DC | PRN
  Administered 2016-09-22 (×2): 650 mg via ORAL
  Filled 2016-09-22 (×2): qty 2

## 2016-09-22 MED ORDER — SODIUM CHLORIDE 0.9 % IV SOLN: 250.0000 mL | INTRAVENOUS | Status: DC | PRN

## 2016-09-22 MED ORDER — TRAZODONE HCL 100 MG PO TABS
100.0000 mg | ORAL_TABLET | Freq: Every day | ORAL | Status: DC
  Administered 2016-09-22 – 2016-10-02 (×12): 100 mg via ORAL
  Filled 2016-09-22 (×8): qty 1
  Filled 2016-09-22: qty 2
  Filled 2016-09-22 (×3): qty 1

## 2016-09-22 MED ORDER — FUROSEMIDE 10 MG/ML IJ SOLN
40.0000 mg | Freq: Every day | INTRAMUSCULAR | Status: DC
  Administered 2016-09-23: 40 mg via INTRAVENOUS
  Filled 2016-09-22: qty 4

## 2016-09-22 MED ORDER — METOPROLOL SUCCINATE ER 50 MG PO TB24
75.0000 mg | ORAL_TABLET | Freq: Every day | ORAL | Status: DC
  Administered 2016-09-22 – 2016-09-23 (×2): 75 mg via ORAL
  Filled 2016-09-22: qty 1
  Filled 2016-09-22: qty 3

## 2016-09-22 MED ORDER — FUROSEMIDE 10 MG/ML IJ SOLN: 40.0000 mg | Freq: Every day | INTRAMUSCULAR | Status: DC

## 2016-09-22 MED ORDER — DIPHENHYDRAMINE-APAP (SLEEP) 25-500 MG PO TABS: 2.0000 | ORAL_TABLET | Freq: Every day | ORAL | Status: DC

## 2016-09-22 MED ORDER — FUROSEMIDE 10 MG/ML IJ SOLN
60.0000 mg | Freq: Two times a day (BID) | INTRAMUSCULAR | Status: DC
  Administered 2016-09-22: 60 mg via INTRAVENOUS
  Filled 2016-09-22: qty 8

## 2016-09-22 MED ORDER — ATORVASTATIN CALCIUM 80 MG PO TABS
80.0000 mg | ORAL_TABLET | Freq: Every day | ORAL | Status: DC
  Administered 2016-09-22 – 2016-09-25 (×4): 80 mg via ORAL
  Filled 2016-09-22 (×4): qty 1

## 2016-09-22 MED ORDER — INSULIN GLARGINE 100 UNIT/ML ~~LOC~~ SOLN
30.0000 [IU] | Freq: Every day | SUBCUTANEOUS | Status: DC
  Administered 2016-09-25 – 2016-09-26 (×2): 30 [IU] via SUBCUTANEOUS
  Filled 2016-09-22 (×6): qty 0.3

## 2016-09-22 MED ORDER — METOPROLOL TARTRATE 5 MG/5ML IV SOLN
5.0000 mg | INTRAVENOUS | Status: DC | PRN
  Administered 2016-09-26: 5 mg via INTRAVENOUS
  Filled 2016-09-22 (×4): qty 5

## 2016-09-22 MED ORDER — METOPROLOL SUCCINATE ER 25 MG PO TB24: 50.0000 mg | ORAL_TABLET | Freq: Every day | ORAL | Status: DC

## 2016-09-22 MED ORDER — INSULIN ASPART 100 UNIT/ML ~~LOC~~ SOLN
0.0000 [IU] | Freq: Three times a day (TID) | SUBCUTANEOUS | Status: DC
  Administered 2016-09-22: 2 [IU] via SUBCUTANEOUS
  Administered 2016-09-23 – 2016-09-26 (×8): 1 [IU] via SUBCUTANEOUS
  Administered 2016-09-29: 2 [IU] via SUBCUTANEOUS
  Administered 2016-09-29: 1 [IU] via SUBCUTANEOUS
  Administered 2016-09-30 (×2): 2 [IU] via SUBCUTANEOUS
  Administered 2016-09-30: 1 [IU] via SUBCUTANEOUS
  Administered 2016-10-01: 2 [IU] via SUBCUTANEOUS
  Administered 2016-10-01: 1 [IU] via SUBCUTANEOUS
  Administered 2016-10-01: 2 [IU] via SUBCUTANEOUS
  Administered 2016-10-02: 1 [IU] via SUBCUTANEOUS
  Administered 2016-10-02: 3 [IU] via SUBCUTANEOUS
  Administered 2016-10-03 – 2016-10-05 (×3): 2 [IU] via SUBCUTANEOUS

## 2016-09-22 MED ORDER — PANTOPRAZOLE SODIUM 40 MG PO TBEC
40.0000 mg | DELAYED_RELEASE_TABLET | Freq: Every day | ORAL | Status: DC
  Administered 2016-09-22 – 2016-10-05 (×14): 40 mg via ORAL
  Filled 2016-09-22 (×14): qty 1

## 2016-09-22 MED ORDER — SODIUM CHLORIDE 0.9% FLUSH: 3.0000 mL | INTRAVENOUS | Status: DC | PRN

## 2016-09-22 MED ORDER — SODIUM CHLORIDE 0.9% FLUSH
3.0000 mL | Freq: Two times a day (BID) | INTRAVENOUS | Status: DC
Start: 2016-09-22 — End: 2016-09-29
  Administered 2016-09-22 – 2016-09-28 (×14): 3 mL via INTRAVENOUS

## 2016-09-22 NOTE — ED Notes (Signed)
Patient transported to X-ray 

## 2016-09-22 NOTE — ED Notes (Signed)
13 beat run of Vtach noted on monitor. Pt CAO, no distress noted. Dr.Singh paged and made aware.

## 2016-09-22 NOTE — Progress Notes (Signed)
Mark Rivers, is a 82 y.o. male, DOB - 17-May-1934, HCW:[id number] - patient admitted earlier this morning for chief complaints of shortness of breath, he was admitted few weeks ago for CHF, noted that his right shoulder is also painful and she says it has been his issue for a while, he says he has received steroid shots in that shoulder in the past.  Acute on chronic diastolic CHF with EF of 17%. Received IV Lasix 80 mg earlier this morning, we'll give him only 40 IV today, monitor BUN/creatinine closely, continue beta blocker. Cardiology consulted. On home oxygen which will be continued.  Chronic atrial fibrillation. Follows with Dr. Johnsie Cancel - increased beta blocker dose for better rate control, may require amiodarone for better rate control, have requested cardiology to evaluate, [place] vasc 2 score of at least 7 however not on anticoagulation due to GI bleed.  ARF CKD stage IV. Baseline creatinine appears to be close to 1.5. Reduce diuretics for now, renal ultrasound obtained this morning was unremarkable, monitor BMP, monitor urine output.  Leukocytosis. Nonspecific. Afebrile, appears nontoxic, UA borderline chest x-ray unremarkable. Monitor clinically. Upturning right shoulder x-ray as well as he has acute on chronic right shoulder discomfort.   Vitals:   09/22/16 0445 09/22/16 0630 09/22/16 0801 09/22/16 0918  BP:   110/68 101/68  Pulse: (!) 111 (!) 109 (!) 103 (!) 101  Resp: (!) 24  (!) 22 20  Temp:      TempSrc:      SpO2: 98% (!) 83% 96% 98%  Weight:      Height:            Data Review   Micro Results No results found for this or any previous visit (from the past 240 hour(s)).  Radiology Reports Dg Chest 2 View  Result Date: 09/02/2016 CLINICAL DATA:  Cough for 3 weeks.  Recent fall. EXAM: CHEST  2 VIEW COMPARISON:  08/31/2016 and 05/07/2016 FINDINGS: Evidence for small bilateral pleural  effusions, left greater than right. Heart size is upper limits of normal but stable. Prior median sternotomy. There is no significant pulmonary edema or significant airspace disease. However, there are some patchy densities in the anterior chest which have increased since April 2018. This may represent atelectasis. Negative for a pneumothorax. Bony thorax appears to be intact. IMPRESSION: Persistent small pleural effusions. Persistent patchy densities in anterior chest that may represent atelectasis. Electronically Signed   By: Markus Daft M.D.   On: 09/02/2016 15:01   Dg Chest 2 View  Result Date: 08/31/2016 CLINICAL DATA:  Shortness of breath and cough EXAM: CHEST  2 VIEW COMPARISON:  08/30/2016 FINDINGS: Chronic cardiomegaly. Status post CABG. Stable mediastinal contours. Trace pleural effusions. Pulmonary vascular congestion. Hyperinflation, probable COPD. IMPRESSION: 1. Cardiomegaly with trace effusions and pulmonary vascular congestion. 2. Hyperinflation suggesting COPD. Electronically Signed   By: Monte Fantasia M.D.   On: 08/31/2016 13:32   Dg Shoulder Right  Result Date: 09/22/2016 CLINICAL DATA:  Right shoulder pain. EXAM: RIGHT SHOULDER - 2+ VIEW COMPARISON:  07/10/2016. FINDINGS: Acromioclavicular and glenohumeral degenerative change. No acute abnormality identified. No evidence of fracture or dislocation. Prior median sternotomy. IMPRESSION: Degenerative changes right shoulder. No acute bony abnormality identified. No evidence of fracture or dislocation. Electronically Signed   By: Marcello Moores  Register   On: 09/22/2016 10:00   Ct Head Wo Contrast  Result Date: 08/31/2016 CLINICAL DATA:  Head injury after fall today. EXAM: CT HEAD WITHOUT CONTRAST CT CERVICAL SPINE WITHOUT CONTRAST TECHNIQUE:  Multidetector CT imaging of the head and cervical spine was performed following the standard protocol without intravenous contrast. Multiplanar CT image reconstructions of the cervical spine were also  generated. COMPARISON:  CT scan of May 07, 2016. FINDINGS: CT HEAD FINDINGS Brain: Mild diffuse cortical atrophy is noted. Minimal chronic ischemic white matter disease is noted. No mass effect or midline shift is noted. Ventricular size is within normal limits. There is no evidence of mass lesion, hemorrhage or acute infarction. Vascular: No hyperdense vessel or unexpected calcification. Skull: Normal. Negative for fracture or focal lesion. Sinuses/Orbits: No acute finding. Other: None. CT CERVICAL SPINE FINDINGS Alignment: Grade 1 anterolisthesis of C4-5 is noted secondary to posterior facet joint hypertrophy. Skull base and vertebrae: No acute fracture. No primary bone lesion or focal pathologic process. Soft tissues and spinal canal: No prevertebral fluid or swelling. No visible canal hematoma. Disc levels: Severe degenerative disc disease is noted at C5-6 and C6-7 with anterior osteophyte formation. Upper chest: Negative. Other: Degenerative changes seen involving the posterior facets bilaterally, but most prominently on the left. IMPRESSION: Mild diffuse cortical atrophy. Minimal chronic ischemic white matter disease. No acute intracranial abnormality seen. Multilevel degenerative disc disease. No acute abnormality seen in the cervical spine. Electronically Signed   By: Marijo Conception, M.D.   On: 08/31/2016 16:22   Ct Cervical Spine Wo Contrast  Result Date: 08/31/2016 CLINICAL DATA:  Head injury after fall today. EXAM: CT HEAD WITHOUT CONTRAST CT CERVICAL SPINE WITHOUT CONTRAST TECHNIQUE: Multidetector CT imaging of the head and cervical spine was performed following the standard protocol without intravenous contrast. Multiplanar CT image reconstructions of the cervical spine were also generated. COMPARISON:  CT scan of May 07, 2016. FINDINGS: CT HEAD FINDINGS Brain: Mild diffuse cortical atrophy is noted. Minimal chronic ischemic white matter disease is noted. No mass effect or midline shift is  noted. Ventricular size is within normal limits. There is no evidence of mass lesion, hemorrhage or acute infarction. Vascular: No hyperdense vessel or unexpected calcification. Skull: Normal. Negative for fracture or focal lesion. Sinuses/Orbits: No acute finding. Other: None. CT CERVICAL SPINE FINDINGS Alignment: Grade 1 anterolisthesis of C4-5 is noted secondary to posterior facet joint hypertrophy. Skull base and vertebrae: No acute fracture. No primary bone lesion or focal pathologic process. Soft tissues and spinal canal: No prevertebral fluid or swelling. No visible canal hematoma. Disc levels: Severe degenerative disc disease is noted at C5-6 and C6-7 with anterior osteophyte formation. Upper chest: Negative. Other: Degenerative changes seen involving the posterior facets bilaterally, but most prominently on the left. IMPRESSION: Mild diffuse cortical atrophy. Minimal chronic ischemic white matter disease. No acute intracranial abnormality seen. Multilevel degenerative disc disease. No acute abnormality seen in the cervical spine. Electronically Signed   By: Marijo Conception, M.D.   On: 08/31/2016 16:22   US Renal  Result Date: 09/22/2016 CLINICAL DATA:  Acute renal failure. EXAM: RENAL / URINARY TRACT ULTRASOUND COMPLETE COMPARISON:  CT 02/21/2016.  Ultrasound 02/10/2016 . FINDINGS: Right Kidney: Length: 11.5 cm. Echogenicity within normal limits. No mass or hydronephrosis visualized. Left Kidney: Length: 11.2 cm. Echogenicity within normal limits. No mass or hydronephrosis visualized. Bladder: Appears normal for degree of bladder distention. IMPRESSION: Negative exam. Electronically Signed   By: Marcello Moores  Register   On: 09/22/2016 10:20   Dg Chest Portable 1 View  Result Date: 09/21/2016 CLINICAL DATA:  Swelling of the legs and abdomen. Atrial fibrillation with rapid ventricular response. EXAM: PORTABLE CHEST 1 VIEW COMPARISON:  09/02/2016  FINDINGS: Stable cardiomegaly with median sternotomy sutures.  Aortic atherosclerosis is again noted at the arch. Mild central vascular congestion consistent with CHF. Probable small pleural effusions blunting the costophrenic angles. No acute nor suspicious osseous abnormalities. IMPRESSION: Stable cardiomegaly with mild CHF. Trace bilateral pleural effusions believed to account for blunting of the costophrenic angles. Aortic atherosclerosis. Electronically Signed   By: Ashley Royalty M.D.   On: 09/21/2016 18:43    CBC  Recent Labs Lab 09/21/16 1823 09/22/16 0728  WBC 17.9* 21.6*  HGB 8.8* 8.7*  HCT 29.4* 28.6*  PLT 402* 357  MCV 87.5 86.4  MCH 26.2 26.3  MCHC 29.9* 30.4  RDW 17.5* 17.4*  LYMPHSABS  --  1.4  MONOABS  --  1.6*  EOSABS  --  0.0  BASOSABS  --  0.0    Chemistries   Recent Labs Lab 09/21/16 1823 09/22/16 0728  NA 133* 131*  K 4.3 4.0  CL 96* 94*  CO2 27 26  GLUCOSE 115* 111*  BUN 43* 54*  CREATININE 1.83* 2.15*  CALCIUM 8.4* 8.1*  MG 1.9  --    ------------------------------------------------------------------------------------------------------------------ estimated creatinine clearance is 29.4 mL/min (A) (by C-G formula based on SCr of 2.15 mg/dL (H)). ------------------------------------------------------------------------------------------------------------------ No results for input(s): HGBA1C in the last 72 hours. ------------------------------------------------------------------------------------------------------------------ No results for input(s): CHOL, HDL, LDLCALC, TRIG, CHOLHDL, LDLDIRECT in the last 72 hours. ------------------------------------------------------------------------------------------------------------------ No results for input(s): TSH, T4TOTAL, T3FREE, THYROIDAB in the last 72 hours.  Invalid input(s): FREET3 ------------------------------------------------------------------------------------------------------------------ No results for input(s): VITAMINB12, FOLATE, FERRITIN, TIBC, IRON,  RETICCTPCT in the last 72 hours.  Coagulation profile No results for input(s): INR, PROTIME in the last 168 hours.  No results for input(s): DDIMER in the last 72 hours.  Cardiac Enzymes  Recent Labs Lab 09/22/16 0241 09/22/16 0728  TROPONINI 0.21* 0.20*   ------------------------------------------------------------------------------------------------------------------ Invalid input(s): POCBNP   Signature  Lala Lund M.D on 09/22/2016 at 10:35 AM  Between 7am to 7pm - Pager - [phone] ( page via [email or web address], text pages only, please mention full 10 digit call back number).  After 7pm go to [email or web address] - password [email or web address]

## 2016-09-22 NOTE — ED Notes (Addendum)
Mark Rivers, pt daughter would like updates [phone]

## 2016-09-22 NOTE — ED Notes (Signed)
Lunch tray delivered.

## 2016-09-22 NOTE — H&P (Signed)
History and Physical    Mark Rivers QQP:[id number] DOB: 1934/10/14 DOA: 09/21/2016  Referring MD/NP/PA: Ladell Pier, MD PCP: [name], MD  Patient coming from: Home. EMS  Chief Complaint: Shortness of breath  HPI: Mark Rivers is a 82 y.o. male with medical history significant of PAF, CAD, CVA, chronic diastolic CHF last EF 71-24%, chronic respiratory failure on 2- 3 L, DM type II, GI bleed in March 2018,  esophageal adenocarcinoma; who presents with complaints of acute shortness of breath. Patient at baseline lives alone, but has home health aides that check on him daily. Review of records shows the patient which was recently hospitalized for acute on chronic congestive heart failure from 8/23-8/25. Since leaving hospital patient notes that he's been complaining of orthopnea with swelling in his legs and belly. Patient reports taking medications as prescribed, but symptoms persisted. He denies having any chest pain, palpitations, change in vision, or focal weakness. Review of records appears to be a poor candidate for anticoagulation due to bleeding. Aspirin was discontinued per Cardiology note 08/24/2016 (Dr. Jenkins Rouge), and at some point there were discussions of placing the patient on amiodaronefor possible cardioversion in a few weeks. However, it appears it is still on metoprolol.  ED Course: Upon admission into the emergency department patient was noted to be afebrile, blood pressures maintained, O2 saturations as low as 89% on home O2-3 L, and heart rates up to 144. EKG showed A. fib with RVR for which patient was given 5 mg of metoprolol with improvement of heart rates. Labs revealed WBC 17.9, hemoglobin 8.8, platelets 402, sodium 133, BUN 43, creatinine 1.83, troponin negative, BNP 1875.7. Chest x-ray showing stable cardiomegaly with mild CHF with small bilateral pleural effusions. Patient was given 80 mg of Lasix IV 1 dose in the emergency department. Cardiology was notified, but  recommended monitoring overnight.   Review of Systems  Constitutional: Positive for malaise/fatigue. Negative for chills and fever.  HENT: Positive for hearing loss. Negative for ear discharge.   Eyes: Negative for photophobia and pain.  Respiratory: Positive for shortness of breath.   Cardiovascular: Positive for orthopnea and leg swelling. Negative for chest pain and palpitations.  Gastrointestinal: Negative for abdominal pain, nausea and vomiting.  Genitourinary: Negative for dysuria and frequency.  Musculoskeletal: Positive for back pain, joint pain and myalgias.  Skin: Negative for itching and rash.  Neurological: Positive for weakness. Negative for sensory change and focal weakness.  Endo/Heme/Allergies: Negative for polydipsia.  Psychiatric/Behavioral: Negative for memory loss and substance abuse.    Past Medical History:  Diagnosis Date  . Allergy   . Back pain   . Chronic diastolic CHF (congestive heart failure) ([place]) 12/21/2015   A. Echo 10/17: Moderate LVH, EF 55-60, normal wall motion, grade 2 diastolic dysfunction, mild aortic stenosis (mean 14, peak 34), MAC, mild LAE    . Chronic respiratory failure ([place])   . CKD (chronic kidney disease) stage 3, GFR 30-59 ml/min 03/04/2014  . Coronary artery disease    a. s/p remote POBA in 1988 // b. NSTEMI in 10/17 >> LHC with 99% LM stenosis >> emergent CABG (L-LAD, S-OM2, S-PDA) - post op course complicated (prolonged ventilation, s/p Trach, AFib, anemia req transfusion, bacteremia >> DC to LTAC)  . Diabetes mellitus   . Difficult intubation    difficult airway/FYI note 11/01/2015  . Esophageal adenocarcinoma ([place])    a. found by EGD 03/2016..  . GERD (gastroesophageal reflux disease)   . GI bleed 01/2016   a. Adm  01/2016: EGD demonstrated a single bleeding angiodysplastic lesion in the stomach which was tx with argonplasma coagulation; single mucosal papule found in the stomach that was biopsied and erosive duodenitis. F/u EGD  with esophageal adenocarcinoma 03/2016.  [name] History of nuclear stress test    a. Myoview 6/14 - EF 51, no ischemia or scar; Low Risk  . Hyperlipemia   . Hypertension   . Methicillin resistant Staphylococcus epidermidis infection 11/2015   a. during adm for CABG.  . Neuropathy   . On home oxygen therapy    "2L; 24/7" (02/09/2016)  . Persistent atrial fibrillation ([place]) 10/25/2015  . Sinus bradycardia    a. Prior HR 54 when in sinus rhythm 11/2015.  . Stroke [facility])    a. old L pontine infarcts seen on CT 04/2016.    Past Surgical History:  Procedure Laterality Date  . CARDIAC CATHETERIZATION N/A 10/26/2015   Procedure: Left Heart Cath and Coronary Angiography;  Surgeon: Troy Sine, MD;  Location: [place] [facility];  Service: Cardiovascular;  Laterality: N/A;  . CATARACT EXTRACTION W/ INTRAOCULAR LENS  IMPLANT, BILATERAL  2008  . COLONOSCOPY    . CORONARY ANGIOPLASTY WITH STENT PLACEMENT  1988  . CORONARY ARTERY BYPASS GRAFT N/A 10/26/2015   Procedure: CORONARY ARTERY BYPASS GRAFTING (CABG) x3(LIMA to LA, SVG to OM1, SVG to PDA) with EVH from the left thigh and partial lower leg greater saphenous vein and left internal mammary artery;  Surgeon: Ivin Poot, MD;  Location: [place];  Service: Open Heart Surgery;  Laterality: N/A;  . ESOPHAGOGASTRODUODENOSCOPY (EGD) WITH PROPOFOL N/A 02/11/2016   Procedure: ESOPHAGOGASTRODUODENOSCOPY (EGD) WITH PROPOFOL;  Surgeon: Ladene Artist, MD;  Location: [facility] ENDOSCOPY;  Service: Endoscopy;  Laterality: N/A;  . EUS N/A 03/09/2016   Procedure: UPPER ENDOSCOPIC ULTRASOUND (EUS) RADIAL;  Surgeon: Milus Banister, MD;  Location: [facility] ENDOSCOPY;  Service: Endoscopy;  Laterality: N/A;  . TEE WITHOUT CARDIOVERSION N/A 10/26/2015   Procedure: TRANSESOPHAGEAL ECHOCARDIOGRAM (TEE);  Surgeon: Ivin Poot, MD;  Location: [place];  Service: Open Heart Surgery;  Laterality: N/A;  . TONSILLECTOMY       reports that he quit smoking about 12 years ago. His smoking  use included Cigarettes. He has a 60.00 pack-year smoking history. He has never used smokeless tobacco. He reports that he does not drink alcohol or use drugs.  Allergies  Allergen Reactions  . Amoxicillin Other (See Comments)    PATIENT PASSED OUT   . Cephalexin Other (See Comments)    PATIENT PASSED OUT  . Penicillin G Other (See Comments)    PATIENT PASSED OUT. Has patient had a PCN reaction causing immediate rash, facial/tongue/throat swelling, SOB or lightheadedness with hypotension: Yes Has patient had a PCN reaction causing severe rash involving mucus membranes or skin necrosis: No Has patient had a PCN reaction that required hospitalization: Yes Has patient had a PCN reaction occurring within the last 10 years: Yes If all of the above answers are "NO", then may proceed with Cephalosporin    Family History  Problem Relation Age of Onset  . Diabetes Father   . Colon cancer Neg Hx   . Esophageal cancer Neg Hx   . Stomach cancer Neg Hx   . Rectal cancer Neg Hx   . Allergic rhinitis Neg Hx   . Angioedema Neg Hx   . Asthma Neg Hx   . Eczema Neg Hx   . Immunodeficiency Neg Hx   . Urticaria Neg Hx   .  Liver disease Neg Hx     Prior to Admission medications   Medication Sig Start Date End Date Taking? Authorizing Provider  atorvastatin (LIPITOR) 80 MG tablet Take 1 tablet (80 mg total) by mouth daily. 03/31/16  Yes Josue Hector, MD  cholecalciferol (VITAMIN D) 1000 units tablet Take 1,000 Units by mouth daily.   Yes [provider]  diphenhydramine-acetaminophen (TYLENOL PM) 25-500 MG TABS tablet Take 2 tablets by mouth at bedtime.   Yes [provider]  fenofibrate 54 MG tablet Take 54 mg by mouth daily.   Yes [provider]  furosemide (LASIX) 40 MG tablet Take 1.5 tablets (60 mg total) by mouth 2 (two) times daily. 08/24/16 08/24/17 Yes Josue Hector, MD  Glucosamine-Chondroit-Vit C-Mn (GLUCOSAMINE CHONDR 1500 COMPLX PO) Take 2 tablets by mouth  daily.    Yes [provider]  Insulin Glargine (TOUJEO SOLOSTAR) 300 UNIT/ML SOPN Inject 30 Units into the skin at bedtime.   Yes [provider]  metoprolol succinate (TOPROL-XL) 50 MG 24 hr tablet Take 1 tablet (50 mg total) by mouth daily. Take with or immediately following a meal. 02/16/16 09/21/16 Yes Weaver, Scott T, PA-C  OXYGEN Inhale 2 L/min into the lungs continuous.   Yes [provider]  pantoprazole (PROTONIX) 40 MG tablet Take 1 tablet (40 mg total) by mouth daily at 6 (six) AM. 02/14/16  Yes Allie Bossier, MD  traMADol (ULTRAM) 50 MG tablet Take by mouth every 6 (six) hours as needed.   Yes [provider]  traZODone (DESYREL) 100 MG tablet Take 100 mg by mouth at bedtime.    Yes [provider]  vitamin C (ASCORBIC ACID) 500 MG tablet Take 500 mg by mouth daily.   Yes [provider]    Physical Exam:  Constitutional: Elderly male appears in mild respiratory discomfort Vitals:   09/21/16 2145 09/21/16 2200 09/21/16 2230 09/21/16 2300  BP: 112/86 112/81 (!) 109/94 135/78  Pulse: (!) 144 (!) 132  (!) 144  Resp: 20 (!) 21  19  Temp:      TempSrc:      SpO2: 96% 93% 99% 96%  Weight:      Height:       Eyes: PERRL, lids and conjunctivae normal ENMT: Mucous membranes are moist. Posterior pharynx clear of any exudate or lesions  Neck: normal, supple, no masses, no thyromegaly Respiratory: Bilateral crackles noted on physical exam with patient talking in shorten sinuses. Cardiovascular: Irregular irregular, no murmurs / rubs / gallops. 2+ pitting bilateral lower extremity edema. 2+ pedal pulses. No carotid bruits.  Abdomen: no tenderness, no masses palpated. No hepatosplenomegaly. Bowel sounds positive.  Musculoskeletal: no clubbing / cyanosis. No joint deformity upper and lower extremities. Good ROM, no contractures. Normal muscle tone.  Skin: no rashes, lesions, ulcers. No induration Neurologic: CN 2-12 grossly intact.  Sensation intact, DTR normal. Strength 5/5 in all 4.  Psychiatric: Normal judgment and insight. Alert and oriented x 3. Normal mood.     Labs on Admission: I have personally reviewed following labs and imaging studies  CBC:  Recent Labs Lab 09/21/16 1823  WBC 17.9*  HGB 8.8*  HCT 29.4*  MCV 87.5  PLT 436*   Basic Metabolic Panel:  Recent Labs Lab 09/21/16 1823  NA 133*  K 4.3  CL 96*  CO2 27  GLUCOSE 115*  BUN 43*  CREATININE 1.83*  CALCIUM 8.4*  MG 1.9   GFR: Estimated Creatinine Clearance: 34.5 mL/min (A) (  by C-G formula based on SCr of 1.83 mg/dL (H)). Liver Function Tests: No results for input(s): AST, ALT, ALKPHOS, BILITOT, PROT, ALBUMIN in the last 168 hours. No results for input(s): LIPASE, AMYLASE in the last 168 hours. No results for input(s): AMMONIA in the last 168 hours. Coagulation Profile: No results for input(s): INR, PROTIME in the last 168 hours. Cardiac Enzymes: No results for input(s): CKTOTAL, CKMB, CKMBINDEX, TROPONINI in the last 168 hours. BNP (last 3 results) No results for input(s): PROBNP in the last 8760 hours. HbA1C: No results for input(s): HGBA1C in the last 72 hours. CBG: No results for input(s): GLUCAP in the last 168 hours. Lipid Profile: No results for input(s): CHOL, HDL, LDLCALC, TRIG, CHOLHDL, LDLDIRECT in the last 72 hours. Thyroid Function Tests: No results for input(s): TSH, T4TOTAL, FREET4, T3FREE, THYROIDAB in the last 72 hours. Anemia Panel: No results for input(s): VITAMINB12, FOLATE, FERRITIN, TIBC, IRON, RETICCTPCT in the last 72 hours. Urine analysis:    Component Value Date/Time   COLORURINE YELLOW 02/09/2016 1700   APPEARANCEUR CLEAR 02/09/2016 1700   LABSPEC 1.012 02/09/2016 1700   PHURINE 5.0 02/09/2016 1700   GLUCOSEU NEGATIVE 02/09/2016 1700   HGBUR NEGATIVE 02/09/2016 1700   BILIRUBINUR NEGATIVE 02/09/2016 1700   KETONESUR NEGATIVE 02/09/2016 1700   PROTEINUR NEGATIVE 02/09/2016 1700    UROBILINOGEN 0.2 10/26/2008 1315   NITRITE NEGATIVE 02/09/2016 1700   LEUKOCYTESUR NEGATIVE 02/09/2016 1700   Sepsis Labs: No results found for this or any previous visit (from the past 240 hour(s)).   Radiological Exams on Admission: Dg Chest Portable 1 View  Result Date: 09/21/2016 CLINICAL DATA:  Swelling of the legs and abdomen. Atrial fibrillation with rapid ventricular response. EXAM: PORTABLE CHEST 1 VIEW COMPARISON:  09/02/2016 FINDINGS: Stable cardiomegaly with median sternotomy sutures. Aortic atherosclerosis is again noted at the arch. Mild central vascular congestion consistent with CHF. Probable small pleural effusions blunting the costophrenic angles. No acute nor suspicious osseous abnormalities. IMPRESSION: Stable cardiomegaly with mild CHF. Trace bilateral pleural effusions believed to account for blunting of the costophrenic angles. Aortic atherosclerosis. Electronically Signed   By: Ashley Royalty M.D.   On: 09/21/2016 18:43    EKG: Independently reviewed. A. fib with RVR at 145 bpm  Assessment/Plan Respiratory failure with hypoxia 2/2 diastolic congestive heart failure: Acute on chronic. Patient presents with complaints of acute shortness of breath. Maintaining O2 saturations on home 3 L. X-ray showing stable cardiomegaly with mild CHF.  Patient has peripheral 2+ pitting edema of the lower extremities and crackles on lung exam. Patient was given 80 mg Lasix IV in the ED. Last echocardiogram was in 05/07/2016 showing EF of 55-60%. - Admit to telemetry bed - Heart failure order set initiated - Continuous pulse oximetry overnight - Strict I&O's and daily weights - Lasix 60 mg IV BID as tolerated - message sent to [facility] for cards eval - May need to order echocardiogram depending on Cards evaluation  Persistent atrial fibrillation: Acute on chronic. Patient was seen to be in A. fib with RVR with a rate of 1 45 bpm on admission. Given 5 mg of metoprolol with improvement of  heart rates. Chadsvasc=8.  Not on anticoagulation for history of GI bleed. Plan previously had been possibly for cardioversion. Patient was given 5 mg of Lopressor with improvement of heart rates while in the ED. - Continue metoprolol po - IV metoprolol when necessary overnight - Follow-up with cardiology - Trend troponins  Leukocytosis: Acute. Initial WBC 17.9 on  admission. SIRS criteria are met, but no clear source of infection. - Check lactic acid and urinalysis  Diabetes mellitus type 2: Last hemoglobin A1C on file was 6.7 on 07/2016 on care everywhere. - Hypoglycemic protocol - Continue home Lantus - CBGs every before meals with sensitive SSI  Anemia of chronic disease: Hemoglobin appears to have been around 8-9 since March of this year. - Continue to monitor  Possible acute kidney injury on chronic kidney disease stage III: Patient's baseline creatinine previously noted to be around 1.4-1.6, but he presents with a creatinine of 1.83 with BUN 43 on admission. With diuresis as he is currently showing signs of being fluid overloaded creatinine may improve. - Recheck BMP in a.m.  History of esophageal adenocarcinoma - Patient had EGD 03/09/2016 with Dr. [name], showing adenocarcinoma. He was referred to [facility] for further intervention/treatment.  Patient subsequently had EGD with on 07/27/2016 with Dr. Mortimer Fries and had area ablated with spray cryotherapy using liquid nitrogen. - Continue outpatient follow-up  Dyslipidemia - Continue fenofibrate and atorvastatin  Hyponatremia: Acute. Sodium 133 on admission. Likely secondary to acute heart failure. - f/u repeat blood work in a.m.   GERD - Continue Protonix   DVT prophylaxis: Lovenox Code Status:  Full Family Communication: No family present at bedside Disposition Plan: TBD by primary team Consults called: none  Admission status: Inpatient  Norval Morton MD [facility] Pager [phone]   If 7PM-7AM,  please contact night-coverage [email or web address] Password [facility]  09/22/2016, 12:18 AM

## 2016-09-22 NOTE — Consult Note (Signed)
Cardiology Consultation:   Patient ID: Mark Rivers; [id number]; 1934/10/14   Admit date: 09/21/2016 Date of Consult: 09/22/2016  Primary Care Provider: [name], MD Primary Cardiologist: Dr. Johnsie Cancel    Patient Profile:   Mark Rivers is a 82 y.o. male with a hx of CAD s/p CABG 10/2015, persistent AFib not on anticoagulation due to prior GI bleed, esophageal adenocarcinoma, DM2, CKD, HTN, HL, COPD, and chronic diastolic CHF who is being seen today for the evaluation of Afib RVR and CHF  at the request of Dr. Candiss Norse.   Not on ASA due to GI bleed. Not on nitrates due to hypotension. Not on anticoagulation due to esophageal cancer 03/2016. EGD with on 07/27/2016 with Dr. Mortimer Fries and had area ablated with spray cryotherapy using liquid nitrogen  Last seen by Dr. Johnsie Cancel 08/24/2016. Admitted 8/23-8/25 for acute CHF. Diuresed 3.6L. Discharge weight 196lb.   History of Present Illness:   Mark Rivers felt fine initially after discharge. However for the past one week noted gradually worsening of ankle swelling. Denies chest pain or shortness of breath however has orthopnea. In ER, O2 saturations as low as 89% on home O2-3 L. EKG showed afib at rate of 144 bpm. Given IV metoprolol with improved rate. BNP 1875. CXR mild CHF with bilateral pleural effusion. Elevated WBC. Lasix given. Breathing stable. His main complain is shoulder pain.   Past Medical History:  Diagnosis Date  . Allergy   . Back pain   . Chronic diastolic CHF (congestive heart failure) ([place]) 12/21/2015   A. Echo 10/17: Moderate LVH, EF 55-60, normal wall motion, grade 2 diastolic dysfunction, mild aortic stenosis (mean 14, peak 34), MAC, mild LAE    . Chronic respiratory failure ([place])   . CKD (chronic kidney disease) stage 3, GFR 30-59 ml/min 03/04/2014  . Coronary artery disease    a. s/p remote POBA in 1988 // b. NSTEMI in 10/17 >> LHC with 99% LM stenosis >> emergent CABG (L-LAD, S-OM2, S-PDA) - post op  course complicated (prolonged ventilation, s/p Trach, AFib, anemia req transfusion, bacteremia >> DC to LTAC)  . Diabetes mellitus   . Difficult intubation    difficult airway/FYI note 11/01/2015  . Esophageal adenocarcinoma ([place])    a. found by EGD 03/2016..  . GERD (gastroesophageal reflux disease)   . GI bleed 01/2016   a. 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 F/u EGD with esophageal adenocarcinoma 03/2016.  [name] History of nuclear stress test    a. Myoview 6/14 - EF 51, no ischemia or scar; Low Risk  . Hyperlipemia   . Hypertension   . Methicillin resistant Staphylococcus epidermidis infection 11/2015   a. during adm for CABG.  . Neuropathy   . On home oxygen therapy    "2L; 24/7" (02/09/2016)  . Persistent atrial fibrillation ([place]) 10/25/2015  . Sinus bradycardia    a. Prior HR 54 when in sinus rhythm 11/2015.  . Stroke [facility])    a. old L pontine infarcts seen on CT 04/2016.    Past Surgical History:  Procedure Laterality Date  . CARDIAC CATHETERIZATION N/A 10/26/2015   Procedure: Left Heart Cath and Coronary Angiography;  Surgeon: Troy Sine, MD;  Location: [facility];  Service: Cardiovascular;  Laterality: N/A;  . CATARACT EXTRACTION W/ INTRAOCULAR LENS  IMPLANT, BILATERAL  2008  .  COLONOSCOPY    . CORONARY ANGIOPLASTY WITH STENT PLACEMENT  1988  . CORONARY ARTERY BYPASS GRAFT N/A 10/26/2015   Procedure: CORONARY ARTERY BYPASS GRAFTING (CABG) x3(LIMA to LA, SVG to OM1, SVG to PDA) with EVH from the left thigh and partial lower leg greater saphenous vein and left internal mammary artery;  Surgeon: Ivin Poot, MD;  Location: [facility];  Service: Open Heart Surgery;  Laterality: N/A;  . ESOPHAGOGASTRODUODENOSCOPY (EGD) WITH PROPOFOL N/A 02/11/2016   Procedure: ESOPHAGOGASTRODUODENOSCOPY (EGD) WITH  PROPOFOL;  Surgeon: Ladene Artist, MD;  Location[facility];  Service: Endoscopy;  Laterality: N/A;  . EUS N/A 03/09/2016   Procedure: UPPER ENDOSCOPIC ULTRASOUND (EUS) RADIAL;  Surgeon: Milus Banister, MD;  Location: [facility];  Service: Endoscopy;  Laterality: N/A;  . TEE WITHOUT CARDIOVERSION N/A 10/26/2015   Procedure: TRANSESOPHAGEAL ECHOCARDIOGRAM (TEE);  Surgeon: Ivin Poot, MD;  Location: [facility];  Service: Open Heart Surgery;  Laterality: N/A;  . TONSILLECTOMY       Inpatient Medications: Scheduled Meds: . diphenhydrAMINE  50 mg Oral QHS   And  . acetaminophen  1,000 mg Oral QHS  . atorvastatin  80 mg Oral Daily  . enoxaparin (LOVENOX) injection  40 mg Subcutaneous Daily  . fenofibrate  54 mg Oral Daily  . [START ON 09/23/2016] furosemide  40 mg Intravenous Daily  . insulin aspart  0-9 Units Subcutaneous TID WC  . [START ON 09/23/2016] insulin glargine  30 Units Subcutaneous QHS  . metoprolol succinate  75 mg Oral Daily  . pantoprazole  40 mg Oral Q0600  . sodium chloride flush  3 mL Intravenous Q12H  . traZODone  100 mg Oral QHS  . vitamin C  500 mg Oral Daily   Continuous Infusions: . sodium chloride    . magnesium sulfate 1 - 4 g bolus IVPB     PRN Meds: sodium chloride, acetaminophen, metoprolol tartrate, ondansetron (ZOFRAN) IV, sodium chloride flush, traMADol  Allergies:    Allergies  Allergen Reactions  . Amoxicillin Other (See Comments)    PATIENT PASSED OUT   . Cephalexin Other (See Comments)    PATIENT PASSED OUT  . Penicillin G Other (See Comments)    PATIENT PASSED OUT. Has patient had a PCN reaction causing immediate rash, facial/tongue/throat swelling, SOB or lightheadedness with hypotension: Yes Has patient had a PCN reaction causing severe rash involving mucus membranes or skin necrosis: No Has patient had a PCN reaction that required hospitalization: Yes Has patient had a PCN reaction occurring within the last 10 years: Yes If all of the  above answers are "NO", then may proceed with Cephalosporin    Social History:   Social History   Social History  . Marital status: Widowed    Spouse name: N/A  . Number of children: N/A  . Years of education: N/A   Occupational History  . retired    Social History Main Topics  . Smoking status: Former Smoker    Packs/day: 2.00    Years: 30.00    Types: Cigarettes    Quit date: 06/28/2004  . Smokeless tobacco: Never Used  . Alcohol use No     Comment: QUIT 06/28/2004  . Drug use: No  . Sexual activity: Yes   Other Topics Concern  . Not on file   Social History Narrative  . No narrative on file    Family History:    Family History  Problem Relation Age of Onset  . Diabetes Father   .  Colon cancer Neg Hx   . Esophageal cancer Neg Hx   . Stomach cancer Neg Hx   . Rectal cancer Neg Hx   . Allergic rhinitis Neg Hx   . Angioedema Neg Hx   . Asthma Neg Hx   . Eczema Neg Hx   . Immunodeficiency Neg Hx   . Urticaria Neg Hx   . Liver disease Neg Hx      ROS:  Please see the history of present illness.  ROS All other ROS reviewed and negative.     Physical Exam/Data:   Vitals:   09/22/16 0445 09/22/16 0630 09/22/16 0801 09/22/16 0918  BP:   110/68 101/68  Pulse: (!) 111 (!) 109 (!) 103 (!) 101  Resp: (!) 24  (!) 22 20  Temp:      TempSrc:      SpO2: 98% (!) 83% 96% 98%  Weight:      Height:       No intake or output data in the 24 hours ending 09/22/16 1100 Filed Weights   09/21/16 2051  Weight: 199 lb (90.3 kg)   Body mass index is 31.17 kg/m.  General:  Obese male  in no acute distress HEENT: normal Lymph: no adenopathy Neck: + JVD Endocrine:  No thryomegaly Vascular: No carotid bruits; FA pulses 2+ bilaterally without bruits  Cardiac:  normal S1, S2; RRR; no murmur Lungs:  clear to auscultation bilaterally, no wheezing, rhonchi or rales  Abd: soft, nontender, no hepatomegaly  Ext: Trace BL ankle edema Musculoskeletal:  No deformities, BUE and  BLE strength normal and equal Skin: warm and dry  Neuro:  CNs 2-12 intact, no focal abnormalities noted Psych:  Normal affect   EKG:  The EKG was personally reviewed and demonstrates:  afib at rate of 145 bpm Telemetry:  Telemetry was personally reviewed and demonstrates:  afib rate of 110s  Relevant CV Studies: Echo 05/07/16 Study Conclusions  - Left ventricle: The cavity size was normal. Wall thickness was   increased in a pattern of moderate LVH. Systolic function was   normal. The estimated ejection fraction was in the range of 55%   to 60%. Wall motion was normal; there were no regional wall   motion abnormalities. - Aortic valve: There was mild stenosis. There was trivial   regurgitation. Valve area (VTI): 1.56 cm^2. Valve area (Vmax):   1.62 cm^2. Valve area (Vmean): 1.58 cm^2. - Mitral valve: Calcified annulus. There was mild regurgitation. - Left atrium: The atrium was severely dilated. - Right atrium: The atrium was moderately dilated. - Pulmonary arteries: Systolic pressure was moderately increased.   PA peak pressure: 55 mm Hg (S).  Impressions:  - Normal LV systolic function; moderate LVH; calcified aortic valve   with mild AS and trace AI; mild MR; biatrial enlargement; mild TR   with moderately elevated pulmonary pressure.  Laboratory Data:  Chemistry Recent Labs Lab 09/21/16 1823 09/22/16 0728  NA 133* 131*  K 4.3 4.0  CL 96* 94*  CO2 27 26  GLUCOSE 115* 111*  BUN 43* 54*  CREATININE 1.83* 2.15*  CALCIUM 8.4* 8.1*  GFRNONAA 33* 27*  GFRAA 38* 32*  ANIONGAP 10 11    No results for input(s): PROT, ALBUMIN, AST, ALT, ALKPHOS, BILITOT in the last 168 hours. Hematology Recent Labs Lab 09/21/16 1823 09/22/16 0728  WBC 17.9* 21.6*  RBC 3.36* 3.31*  HGB 8.8* 8.7*  HCT 29.4* 28.6*  MCV 87.5 86.4  MCH 26.2 26.3  MCHC  29.9* 30.4  RDW 17.5* 17.4*  PLT 402* 357   Cardiac Enzymes Recent Labs Lab 09/22/16 0241 09/22/16 0728  TROPONINI 0.21*  0.20*    Recent Labs Lab 09/21/16 1837 09/21/16 2200  TROPIPOC 0.08 0.05    BNP Recent Labs Lab 09/21/16 1823  BNP 1,875.7*    DDimer No results for input(s): DDIMER in the last 168 hours.  Radiology/Studies:  Dg Shoulder Right  Result Date: 09/22/2016 CLINICAL DATA:  Right shoulder pain. EXAM: RIGHT SHOULDER - 2+ VIEW COMPARISON:  07/10/2016. FINDINGS: Acromioclavicular and glenohumeral degenerative change. No acute abnormality identified. No evidence of fracture or dislocation. Prior median sternotomy. IMPRESSION: Degenerative changes right shoulder. No acute bony abnormality identified. No evidence of fracture or dislocation. Electronically Signed   By: Marcello Moores  Register   On: 09/22/2016 10:00   US Renal  Result Date: 09/22/2016 CLINICAL DATA:  Acute renal failure. EXAM: RENAL / URINARY TRACT ULTRASOUND COMPLETE COMPARISON:  CT 02/21/2016.  Ultrasound 02/10/2016 . FINDINGS: Right Kidney: Length: 11.5 cm. Echogenicity within normal limits. No mass or hydronephrosis visualized. Left Kidney: Length: 11.2 cm. Echogenicity within normal limits. No mass or hydronephrosis visualized. Bladder: Appears normal for degree of bladder distention. IMPRESSION: Negative exam. Electronically Signed   By: Marcello Moores  Register   On: 09/22/2016 10:20   Dg Chest Portable 1 View  Result Date: 09/21/2016 CLINICAL DATA:  Swelling of the legs and abdomen. Atrial fibrillation with rapid ventricular response. EXAM: PORTABLE CHEST 1 VIEW COMPARISON:  09/02/2016 FINDINGS: Stable cardiomegaly with median sternotomy sutures. Aortic atherosclerosis is again noted at the arch. Mild central vascular congestion consistent with CHF. Probable small pleural effusions blunting the costophrenic angles. No acute nor suspicious osseous abnormalities. IMPRESSION: Stable cardiomegaly with mild CHF. Trace bilateral pleural effusions believed to account for blunting of the costophrenic angles. Aortic atherosclerosis. Electronically  Signed   By: Ashley Royalty M.D.   On: 09/21/2016 18:43    Assessment and Plan:   1. Acute on chronic diastolic CHF - This is his second admission in past 3 weeks. BNP 1875. Given IV lasix 53m x 1 & 618mx 1. Scr worsen to 2.15 from 1.83 yesterday. His Scr was 1.56 on 09/02/16. Renal USKoreanremarkable. Hold lasix today. Re evaluate in AM.   2. Persistent atrial fibrillation with RVR - Rate improved to 110s after IV metoprolol. Continue Toprol XL 7552md. Rate stable in 90-100s. IF elevated rate, consider up titrating BB vs addition of cardizem. Not on anticoagulation due to esophageal cancer and prior GI bleed.   3. Acute on CKD, stage III-IV - BUN/SCr worsen with diuresis. As above.   4. Elevated troponin with hx of CAD s/p CABG - flat trend. Likely demand from acte CHF and elevated rate. No chest pain. Continue statin.   5. Should and ankle pain - Pending uric acid. Per primary team   For questions or updates, please contact [facility] consult [email or web address] for contact info under Cardiology/STEMI.   SigJarrett SohoA  09/22/2016 11:00 AM   I have personally seen and examined this patient with VinRobbie LisA. I agree with the assessment and plan as outlined above. He is known to have normal LV systolic function. He has CAD is s/p CABG. He has no chest pain. He is admitted with volume overload in the setting of atrial fib with RVR. He is known to have atrial fibrillation. He is not on anti-coagulation due to GI bleeding/ esophageal cancer. I have reviewed his labs. BNP is elevated.  Creatinine is rising. He has been given IV Lasix and is breathing better. Rate is now controlled.  My exam shows a WDWN male in NAD. NO:TRRNH irreg. Pulm: clear overall. Abd: soft, NT. Ext: 1+ bilateral LE edema EKG reviewed by me and shows atrial fib with RVR Plan: I have reviewed the plan above and agree. He has acute diastolic CHF in the setting of atrial fib with RVR. His atrial fib is  chronic. He is not on anti-coagulation for specific reasons as an outpatient. I will not start anti-coagulation here. Rate is controlled. Symptoms better with diuresis. Follow renal function closely. We will follow with you.   Lauree Chandler 09/22/2016 11:59 AM

## 2016-09-22 NOTE — ED Notes (Signed)
Tray ordered.

## 2016-09-22 NOTE — Progress Notes (Signed)
Pt Daughter Rod Holler called for an update. Very concerned about the joint pains. . Pending Uric acid will have MD call back for prognosis.

## 2016-09-22 NOTE — Progress Notes (Signed)
Pt is drowsy was given pain medicaton in the ED with impaired Kidney Function, Vitals stable on the side of the bed eating with no distress

## 2016-09-23 ENCOUNTER — Inpatient Hospital Stay (HOSPITAL_COMMUNITY): Payer: Medicare Other

## 2016-09-23 DIAGNOSIS — I350 Nonrheumatic aortic (valve) stenosis: Secondary | ICD-10-CM

## 2016-09-23 DIAGNOSIS — I340 Nonrheumatic mitral (valve) insufficiency: Secondary | ICD-10-CM

## 2016-09-23 DIAGNOSIS — I361 Nonrheumatic tricuspid (valve) insufficiency: Secondary | ICD-10-CM

## 2016-09-23 DIAGNOSIS — J9621 Acute and chronic respiratory failure with hypoxia: Secondary | ICD-10-CM

## 2016-09-23 LAB — BASIC METABOLIC PANEL
Anion gap: 14 (ref 5–15)
BUN: 70 mg/dL — ABNORMAL HIGH (ref 6–20)
CO2: 23 mmol/L (ref 22–32)
CREATININE: 2.63 mg/dL — AB (ref 0.61–1.24)
Calcium: 8.3 mg/dL — ABNORMAL LOW (ref 8.9–10.3)
Chloride: 93 mmol/L — ABNORMAL LOW (ref 101–111)
GFR calc non Af Amer: 21 mL/min — ABNORMAL LOW (ref >60)
GFR, EST AFRICAN AMERICAN: 25 mL/min — AB (ref >60)
Glucose, Bld: 126 mg/dL — ABNORMAL HIGH (ref 65–99)
Potassium: 4.3 mmol/L (ref 3.5–5.1)
SODIUM: 130 mmol/L — AB (ref 135–145)

## 2016-09-23 LAB — CBC
HCT: 30.2 % — ABNORMAL LOW (ref 39.0–52.0)
Hemoglobin: 9.2 g/dL — ABNORMAL LOW (ref 13.0–17.0)
MCH: 26.4 pg (ref 26.0–34.0)
MCHC: 30.5 g/dL (ref 30.0–36.0)
MCV: 86.5 fL (ref 78.0–100.0)
PLATELETS: 380 10*3/uL (ref 150–400)
RBC: 3.49 MIL/uL — AB (ref 4.22–5.81)
RDW: 17.4 % — ABNORMAL HIGH (ref 11.5–15.5)
WBC: 17.6 10*3/uL — AB (ref 4.0–10.5)

## 2016-09-23 LAB — GLUCOSE, CAPILLARY
GLUCOSE-CAPILLARY: 127 mg/dL — AB (ref 65–99)
Glucose-Capillary: 128 mg/dL — ABNORMAL HIGH (ref 65–99)
Glucose-Capillary: 115 mg/dL — ABNORMAL HIGH (ref 65–99)
Glucose-Capillary: 127 mg/dL — ABNORMAL HIGH (ref 65–99)

## 2016-09-23 LAB — URIC ACID, RANDOM URINE: URIC ACID, URINE: 21.4 mg/dL

## 2016-09-23 LAB — MAGNESIUM: MAGNESIUM: 2.3 mg/dL (ref 1.7–2.4)

## 2016-09-23 MED ORDER — GUAIFENESIN-DM 100-10 MG/5ML PO SYRP
5.0000 mL | ORAL_SOLUTION | ORAL | Status: DC | PRN
  Administered 2016-09-23 – 2016-10-03 (×11): 5 mL via ORAL
  Filled 2016-09-23 (×12): qty 5

## 2016-09-23 MED ORDER — ENOXAPARIN SODIUM 30 MG/0.3ML ~~LOC~~ SOLN
30.0000 mg | Freq: Every day | SUBCUTANEOUS | Status: DC
  Administered 2016-09-24 – 2016-09-28 (×5): 30 mg via SUBCUTANEOUS
  Filled 2016-09-23 (×5): qty 0.3

## 2016-09-23 MED ORDER — METOPROLOL SUCCINATE ER 50 MG PO TB24: 50.0000 mg | ORAL_TABLET | Freq: Every day | ORAL | Status: DC

## 2016-09-23 MED ORDER — FUROSEMIDE 10 MG/ML IJ SOLN
80.0000 mg | Freq: Two times a day (BID) | INTRAMUSCULAR | Status: AC
  Administered 2016-09-23 – 2016-09-24 (×2): 80 mg via INTRAVENOUS
  Filled 2016-09-23: qty 8

## 2016-09-23 MED ORDER — METOPROLOL SUCCINATE ER 25 MG PO TB24
25.0000 mg | ORAL_TABLET | Freq: Every day | ORAL | Status: DC
  Administered 2016-09-24: 25 mg via ORAL
  Filled 2016-09-23: qty 1

## 2016-09-23 NOTE — Progress Notes (Addendum)
PROGRESS NOTE  Mark Rivers GQQ:[id number] DOB: 1934/08/08 DOA: 09/21/2016 PCP: [name], MD  HPI/Recap of past 24 hours:  Frail elderly report feeling better, denies chest pain, no sob at rest Report chronic right shoulder pain, not able to lift above his shoulder Urine output 700cc, not sure is correctly documented Cr worsening  Assessment/Plan: Principal Problem:   Acute on chronic respiratory failure with hypoxia ([place]) Active Problems:   Diabetes mellitus with stage 3 chronic kidney disease (Mark Rivers)   Persistent atrial fibrillation ([facility])   Acute on chronic diastolic heart failure ([facility])   Anemia of chronic disease  Acute on chronic diastolic CHF with EF of 67%. Chronic hypoxic respiratory failure on home o2 -he received IV Lasix 60mg  then 80 mg yesterday, he is currently on lasix 40 IV daily,  -cr worsening, still edematous, cardiorenal? Avoid hypotension, reduce betablocker -Bilateral lower extremity edema, recent venous doppler negative for DVT ( done in 08/2016) -will follow Cardiology recommendations.  Chronic atrial fibrillation.  Patient presented with afib/RVr, bebtblocker dose increased, heart rate better controlled but borderline low bp with worsening of renal fucntion decrease betablocker today on 9/15 may require amiodarone for better rate control, [place] vasc 2 score of at least 7 however not on anticoagulation due to GI bleed. Cardiology consulted, will follow recommendations  ARF CKD stage IV.  Baseline creatinine appears to be close to 1.5.  Cr worsening, renal US no obstruction, ua on admission no infection , no blood, no leukocytosis Avoid hypotension, reduce betablocker Remain volume overload, worsening of renal function from cardiorenal? Hypotension? Repeat bmp in am, renal dosing meds  Hyponatremia: mild , likely from volume overload  continue diuresis  Leukocytosis. Nonspecific. Afebrile, appears nontoxic, UA borderline chest x-ray  unremarkable.   Right shoulder pain, report for about 3weeks, report h/o steroid injection He is not able to life right arm above shoulder right shoulder x ray with degenerative changes, no acute findings   Uric acid elevated: consider allopurinol once renal function improves. He is not a good candidate for uloric due to cardiac history.   Insulin dependent dm2,  Continue insulin, check a1c, am blood sugar 111-126  CAD s/p urgent CABG in 2017  Recent h/o esophageal adenocarcinoma: Patient had EGD 03/09/2016 with Dr. [name], showing adenocarcinoma. He was referred to [facility] for further intervention/treatment.  Patient subsequently had EGD with on 07/27/2016 with Dr. Mortimer Fries and had area ablated with spray cryotherapy using liquid nitrogen. - Continue outpatient follow-up  FTT: this is his 5th hospitalization this year  will get Pt eval   Code Status: full  Family Communication: patient , I have talked to patient's daughter Mark Rivers) Mark Rivers over the phone [phone], per daughter patient has a established DNR in the past, but she want to verify with the patient, for now she agrees full code.  Disposition Plan: pending, need monitor renal function, need PT eva, need cardiology clearance   Consultants:  cardiology  Procedures:  none  Antibiotics:  none   Objective: BP 101/72 (BP Location: Left Arm)   Pulse 60   Temp 97.8 F (36.6 C) (Oral)   Resp 18   Ht 5\' 7"  (1.702 m)   Wt 90.6 kg (199 lb 11.2 oz)   SpO2 97%   BMI 31.28 kg/m   Intake/Output Summary (Last 24 hours) at 09/23/16 0758 Last data filed at 09/23/16 0557  Gross per 24 hour  Intake              340  ml  Output              700 ml  Net             -360 ml   Filed Weights   09/21/16 2051 09/22/16 1417  Weight: 90.3 kg (199 lb) 90.6 kg (199 lb 11.2 oz)    Exam: Patient is examined daily including today on 09/23/2016, exams remain the same as of yesterday except that has changed     General:  Frail , chronically ill appearing, elderly male, on o2 supplement, not in acute distress, hard of hearing  Cardiovascular: IRRR  Respiratory: rhonchi? Vs upper airway sounds, crackles, no wheezing  Abdomen: Soft/ND/NT, positive BS  Musculoskeletal: bilateral lower extremity pitting Edema  Neuro: alert, oriented   Data Reviewed: Basic Metabolic Panel:  Recent Labs Lab 09/21/16 1823 09/22/16 0728 09/23/16 0604  NA 133* 131* 130*  K 4.3 4.0 4.3  CL 96* 94* 93*  CO2 27 26 23   GLUCOSE 115* 111* 126*  BUN 43* 54* 70*  CREATININE 1.83* 2.15* 2.63*  CALCIUM 8.4* 8.1* 8.3*  MG 1.9  --  2.3   Liver Function Tests: No results for input(s): AST, ALT, ALKPHOS, BILITOT, PROT, ALBUMIN in the last 168 hours. No results for input(s): LIPASE, AMYLASE in the last 168 hours. No results for input(s): AMMONIA in the last 168 hours. CBC:  Recent Labs Lab 09/21/16 1823 09/22/16 0728 09/23/16 0604  WBC 17.9* 21.6* 17.6*  NEUTROABS  --  18.6*  --   HGB 8.8* 8.7* 9.2*  HCT 29.4* 28.6* 30.2*  MCV 87.5 86.4 86.5  PLT 402* 357 380   Cardiac Enzymes:    Recent Labs Lab 09/22/16 0241 09/22/16 0728 09/22/16 1406  TROPONINI 0.21* 0.20* 0.21*   BNP (last 3 results)  Recent Labs  08/18/16 1058 08/31/16 1314 09/21/16 1823  BNP 1,191.6* 2,085.6* 1,875.7*    ProBNP (last 3 results) No results for input(s): PROBNP in the last 8760 hours.  CBG:  Recent Labs Lab 09/22/16 1140 09/22/16 1429 09/22/16 1633 09/22/16 2106 09/23/16 0747  GLUCAP 121* 128* 135* 160* 128*    No results found for this or any previous visit (from the past 240 hour(s)).   Studies: Dg Shoulder Right  Result Date: 09/22/2016 CLINICAL DATA:  Right shoulder pain. EXAM: RIGHT SHOULDER - 2+ VIEW COMPARISON:  07/10/2016. FINDINGS: Acromioclavicular and glenohumeral degenerative change. No acute abnormality identified. No evidence of fracture or dislocation. Prior median sternotomy.  IMPRESSION: Degenerative changes right shoulder. No acute bony abnormality identified. No evidence of fracture or dislocation. Electronically Signed   By: Marcello Moores  Register   On: 09/22/2016 10:00   US Renal  Result Date: 09/22/2016 CLINICAL DATA:  Acute renal failure. EXAM: RENAL / URINARY TRACT ULTRASOUND COMPLETE COMPARISON:  CT 02/21/2016.  Ultrasound 02/10/2016 . FINDINGS: Right Kidney: Length: 11.5 cm. Echogenicity within normal limits. No mass or hydronephrosis visualized. Left Kidney: Length: 11.2 cm. Echogenicity within normal limits. No mass or hydronephrosis visualized. Bladder: Appears normal for degree of bladder distention. IMPRESSION: Negative exam. Electronically Signed   By: Marcello Moores  Register   On: 09/22/2016 10:20    Scheduled Meds: . diphenhydrAMINE  50 mg Oral QHS   And  . acetaminophen  1,000 mg Oral QHS  . atorvastatin  80 mg Oral Daily  . enoxaparin (LOVENOX) injection  40 mg Subcutaneous Daily  . fenofibrate  54 mg Oral Daily  . furosemide  40 mg Intravenous Daily  . insulin aspart  0-9 Units  Subcutaneous TID WC  . insulin glargine  30 Units Subcutaneous QHS  . metoprolol succinate  75 mg Oral Daily  . pantoprazole  40 mg Oral Q0600  . sodium chloride flush  3 mL Intravenous Q12H  . traZODone  100 mg Oral QHS  . vitamin C  500 mg Oral Daily    Continuous Infusions: . sodium chloride       Time spent: 35 mins I have personally reviewed and interpreted on  09/23/2016 daily labs, tele strips, imagings as discussed above under data review session and assessment and plans.  I reviewed all nursing notes, pharmacy notes, consultant notes,  vitals, pertinent old records  I have discussed plan of care as described above with  patient on 09/23/2016   Devinn Voshell MD, PhD  [facility] Pager [phone]. If 7PM-7AM, please contact night-coverage at [email or web address], password [facility] 09/23/2016, 7:58 AM  LOS: 1 day

## 2016-09-23 NOTE — Progress Notes (Signed)
  Echocardiogram 2D Echocardiogram has been performed.  Mark Rivers 09/23/2016, 5:31 PM

## 2016-09-23 NOTE — Progress Notes (Signed)
Progress Note  Patient Name: Mark Rivers Date of Encounter: 09/23/2016  Primary Cardiologist: Raynaldo Opitz    Patient Profile:   Mark Rivers is a 82 y.o. male with a hx of CAD s/p CABG 10/2015, permanent AFib not on anticoagulation due to prior GI bleed, esophageal adenocarcinoma, DM2, CKD, HTN, HL, COPD, and chronic diastolic CHF  Hospitalized  8/23-8/25 for acute CHF. Diuresed 3.6L. Discharge weight 196lb with gradual re accumulation of edema and low O2 Sats Admitted 9/13  Afib RVR 140s and CHF Consult 9/14 for above and worsening renal function  Cr 2.15<<1.83.  BB continued and diuretics held  4/18 Echo EF 55-60 mod LVH and severe BAE       Subjective   Still sob and edematous   Inpatient Medications    Scheduled Meds: . diphenhydrAMINE  50 mg Oral QHS   And  . acetaminophen  1,000 mg Oral QHS  . atorvastatin  80 mg Oral Daily  . enoxaparin (LOVENOX) injection  40 mg Subcutaneous Daily  . fenofibrate  54 mg Oral Daily  . furosemide  40 mg Intravenous Daily  . insulin aspart  0-9 Units Subcutaneous TID WC  . insulin glargine  30 Units Subcutaneous QHS  . [START ON 09/24/2016] metoprolol succinate  50 mg Oral Daily  . pantoprazole  40 mg Oral Q0600  . sodium chloride flush  3 mL Intravenous Q12H  . traZODone  100 mg Oral QHS  . vitamin C  500 mg Oral Daily   Continuous Infusions: . sodium chloride     PRN Meds: sodium chloride, acetaminophen, metoprolol tartrate, ondansetron (ZOFRAN) IV, sodium chloride flush, traMADol   Vital Signs    Vitals:   09/22/16 2024 09/23/16 0555 09/23/16 0749 09/23/16 1207  BP: (!) 104/58 94/68 101/72 101/75  Pulse: 92 83 60 82  Resp: 18 18 18 18   Temp: 99 F (37.2 C) 97.8 F (36.6 C) 97.8 F (36.6 C) 98 F (36.7 C)  TempSrc: Oral Oral Oral Oral  SpO2: 100% 95% 97% 100%  Weight:   202 lb (91.6 kg)   Height:        Intake/Output Summary (Last 24 hours) at 09/23/16 1432 Last data filed at 09/23/16 0736  Gross per 24  hour  Intake              300 ml  Output              200 ml  Net              100 ml   Filed Weights   09/21/16 2051 09/22/16 1417 09/23/16 0749  Weight: 199 lb (90.3 kg) 199 lb 11.2 oz (90.6 kg) 202 lb (91.6 kg)    Telemetry    afib CVR  Personally Reviewed  ECG       Physical Exam   GEN: Mild resp  distress.   Neck: JVD 10 cm Cardiac: IRRR and no   murmurs, rubs, or gallops.  Respiratory: Clear to auscultation bilaterally. GI: Soft, nontender, non-distended  MS: 2+ edema; No deformity. Neuro:  Nonfocal  Psych: Normal affect  Skin Warm and dry   Labs    Chemistry Recent Labs Lab 09/21/16 1823 09/22/16 0728 09/23/16 0604  NA 133* 131* 130*  K 4.3 4.0 4.3  CL 96* 94* 93*  CO2 27 26 23   GLUCOSE 115* 111* 126*  BUN 43* 54* 70*  CREATININE 1.83* 2.15* 2.63*  CALCIUM 8.4* 8.1* 8.3*  GFRNONAA 33* 27* 21*  GFRAA 38* 32* 25*  ANIONGAP 10 11 14      Hematology Recent Labs Lab 09/21/16 1823 09/22/16 0728 09/23/16 0604  WBC 17.9* 21.6* 17.6*  RBC 3.36* 3.31* 3.49*  HGB 8.8* 8.7* 9.2*  HCT 29.4* 28.6* 30.2*  MCV 87.5 86.4 86.5  MCH 26.2 26.3 26.4  MCHC 29.9* 30.4 30.5  RDW 17.5* 17.4* 17.4*  PLT 402* 357 380    Cardiac Enzymes Recent Labs Lab 09/22/16 0241 09/22/16 0728 09/22/16 1406  TROPONINI 0.21* 0.20* 0.21*    Recent Labs Lab 09/21/16 1837 09/21/16 2200  TROPIPOC 0.08 0.05     BNP Recent Labs Lab 09/21/16 1823  BNP 1,875.7*     DDimer No results for input(s): DDIMER in the last 168 hours.   Radiology    Dg Shoulder Right  Result Date: 09/22/2016 CLINICAL DATA:  Right shoulder pain. EXAM: RIGHT SHOULDER - 2+ VIEW COMPARISON:  07/10/2016. FINDINGS: Acromioclavicular and glenohumeral degenerative change. No acute abnormality identified. No evidence of fracture or dislocation. Prior median sternotomy. IMPRESSION: Degenerative changes right shoulder. No acute bony abnormality identified. No evidence of fracture or dislocation.  Electronically Signed   By: Marcello Moores  Register   On: 09/22/2016 10:00   US Renal  Result Date: 09/22/2016 CLINICAL DATA:  Acute renal failure. EXAM: RENAL / URINARY TRACT ULTRASOUND COMPLETE COMPARISON:  CT 02/21/2016.  Ultrasound 02/10/2016 . FINDINGS: Right Kidney: Length: 11.5 cm. Echogenicity within normal limits. No mass or hydronephrosis visualized. Left Kidney: Length: 11.2 cm. Echogenicity within normal limits. No mass or hydronephrosis visualized. Bladder: Appears normal for degree of bladder distention. IMPRESSION: Negative exam. Electronically Signed   By: Marcello Moores  Register   On: 09/22/2016 10:20   Dg Chest Portable 1 View  Result Date: 09/21/2016 CLINICAL DATA:  Swelling of the legs and abdomen. Atrial fibrillation with rapid ventricular response. EXAM: PORTABLE CHEST 1 VIEW COMPARISON:  09/02/2016 FINDINGS: Stable cardiomegaly with median sternotomy sutures. Aortic atherosclerosis is again noted at the arch. Mild central vascular congestion consistent with CHF. Probable small pleural effusions blunting the costophrenic angles. No acute nor suspicious osseous abnormalities. IMPRESSION: Stable cardiomegaly with mild CHF. Trace bilateral pleural effusions believed to account for blunting of the costophrenic angles. Aortic atherosclerosis. Electronically Signed   By: Ashley Royalty M.D.   On: 09/21/2016 18:43    Cardiac Studies        Assessment & Plan    Atrial Fibrillation-permanent  Congestive heart failure acute/chronic-diastolic  Renal injury-acute on chronic  Anemia-chronic  Dyspnea on exertion  The patient has worsening prerenal azotemia in the context of ongoing right-sided volume overload. His ejection fraction previously was normal. The physiology is disconcerting. The differential diagnosis includes renal venous hypertension which would be amenable to diuretics, pericardial effusion in the context of his history of cancer which would be worsened by diuresis, aggravation of  his COPD with cor pulmonale and intravascular volume depletion--'s BNP speaks a little bit against this wound.  I had discussed this with Dr. Zada Finders . We'll try 80 Lasix twice a day for 24 hours and look to see what happens to renal function. We will repeat an echocardiogram.  We will need to follow his renal function.  There may be of benefit for pulmonary input    Signed, Virl Axe, MD  09/23/2016, 2:32 PM

## 2016-09-23 NOTE — Progress Notes (Signed)
Pt is stable throughout the shift, no any complain of CP and SOB, lasix 80mg  IV given, pt is not diuresing that well, is in condom cath, low urine output since morning, will continue to monitor

## 2016-09-24 ENCOUNTER — Inpatient Hospital Stay (HOSPITAL_COMMUNITY): Payer: Medicare Other

## 2016-09-24 DIAGNOSIS — R609 Edema, unspecified: Secondary | ICD-10-CM

## 2016-09-24 LAB — ECHOCARDIOGRAM COMPLETE
FS: 13 % — AB (ref 28–44)
HEIGHTINCHES: 67 in
IVS/LV PW RATIO, ED: 1.02
LA ID, A-P, ES: 55 mm
LA diam end sys: 55 mm
LA diam index: 2.6 cm/m2
LA vol A4C: 90.9 ml
LAVOL: 102 mL
LAVOLIN: 48.3 mL/m2
LV PW d: 11.4 mm — AB (ref 0.6–1.1)
LVOT area: 4.15 cm2
LVOT diameter: 23 mm
Lateral S' vel: 4.74 cm/s
WEIGHTICAEL: 3232 [oz_av]

## 2016-09-24 LAB — BASIC METABOLIC PANEL
Anion gap: 10 (ref 5–15)
BUN: 82 mg/dL — AB (ref 6–20)
CHLORIDE: 90 mmol/L — AB (ref 101–111)
CO2: 28 mmol/L (ref 22–32)
CREATININE: 2.45 mg/dL — AB (ref 0.61–1.24)
Calcium: 8.1 mg/dL — ABNORMAL LOW (ref 8.9–10.3)
GFR, EST AFRICAN AMERICAN: 27 mL/min — AB (ref >60)
GFR, EST NON AFRICAN AMERICAN: 23 mL/min — AB (ref >60)
Glucose, Bld: 151 mg/dL — ABNORMAL HIGH (ref 65–99)
POTASSIUM: 4.6 mmol/L (ref 3.5–5.1)
SODIUM: 128 mmol/L — AB (ref 135–145)

## 2016-09-24 LAB — GLUCOSE, CAPILLARY
GLUCOSE-CAPILLARY: 148 mg/dL — AB (ref 65–99)
GLUCOSE-CAPILLARY: 148 mg/dL — AB (ref 65–99)
Glucose-Capillary: 138 mg/dL — ABNORMAL HIGH (ref 65–99)
Glucose-Capillary: 136 mg/dL — ABNORMAL HIGH (ref 65–99)

## 2016-09-24 LAB — SEDIMENTATION RATE: SED RATE: 59 mm/h — AB (ref 0–16)

## 2016-09-24 LAB — HEMOGLOBIN A1C
Hgb A1c MFr Bld: 7.2 % — ABNORMAL HIGH (ref 4.8–5.6)
MEAN PLASMA GLUCOSE: 159.94 mg/dL

## 2016-09-24 LAB — C-REACTIVE PROTEIN: CRP: 18.2 mg/dL — AB (ref <1.0)

## 2016-09-24 MED ORDER — METOPROLOL SUCCINATE ER 25 MG PO TB24
12.5000 mg | ORAL_TABLET | Freq: Every day | ORAL | Status: DC
  Administered 2016-09-25: 12.5 mg via ORAL
  Filled 2016-09-24: qty 1

## 2016-09-24 MED ORDER — FUROSEMIDE 10 MG/ML IJ SOLN
80.0000 mg | Freq: Three times a day (TID) | INTRAMUSCULAR | Status: DC
  Administered 2016-09-24 – 2016-10-02 (×23): 80 mg via INTRAVENOUS
  Filled 2016-09-24 (×23): qty 8

## 2016-09-24 NOTE — Consult Note (Signed)
Renal Service Consult Note [facility]  Aseem Sessums 09/24/2016 [place] Requesting Physician:  Dr Erlinda Hong  Reason for Consult:  Renal failure HPI: The patient is a 82 y.o. year-old with hx of chronic dCHF, CAD, CM2, GIB , on home O2, esoph adenoCa, CVA, perm afib,  presented on 9/14 with acute SOB.  Last in hospital 8/23- 8/25 with CHF exacerbation.  Presenting c/o's were abd and leg swelling.  In ED CXR showed CHF w small effusions. Creat 1.83.  Admitted and started on IV lasix.  Has put out 2 L urine in 2.5 days on IV lasix. Creat up from 1.8 to 2.45 with diuretics.  Asked to see for a/c renal failure.   CXR on 9/13 and today show unchanged mild CHF. Patient is confused and restless in bed. Tries to answer questions but mostly parrots what I am saying.  No relevant hx obtained except denies CP, +SOB, no abd pain.      Past Medical History  Past Medical History:  Diagnosis Date  . Allergy   . Back pain   . Chronic diastolic CHF (congestive heart failure) ([place]) 12/21/2015   A. Echo 10/17: Moderate LVH, EF 55-60, normal wall motion, grade 2 diastolic dysfunction, mild aortic stenosis (mean 14, peak 34), MAC, mild LAE    . Chronic respiratory failure ([place])   . CKD (chronic kidney disease) stage 3, GFR 30-59 ml/min 03/04/2014  . Coronary artery disease    a. s/p remote POBA in 1988 // b. NSTEMI in 10/17 >> LHC with 99% LM stenosis >> emergent CABG (L-LAD, S-OM2, S-PDA) - post op course complicated (prolonged ventilation, s/p Trach, AFib, anemia req transfusion, bacteremia >> DC to LTAC)  . Diabetes mellitus   . Difficult intubation    difficult airway/FYI note 11/01/2015  . Esophageal adenocarcinoma ([place])    a. found by EGD 03/2016..  . GERD (gastroesophageal reflux disease)   . GI bleed 01/2016   a. Adm 01/2016: EGD demonstrated a single bleeding angiodysplastic lesion in the stomach which was tx with argonplasma coagulation; single mucosal papule found in the  stomach that was biopsied and erosive duodenitis. F/u EGD with esophageal adenocarcinoma 03/2016.  [name] History of nuclear stress test    a. Myoview 6/14 - EF 51, no ischemia or scar; Low Risk  . Hyperlipemia   . Hypertension   . Methicillin resistant Staphylococcus epidermidis infection 11/2015   a. during adm for CABG.  . Neuropathy   . On home oxygen therapy    "2L; 24/7" (02/09/2016)  . Persistent atrial fibrillation ([place]) 10/25/2015  . Sinus bradycardia    a. Prior HR 54 when in sinus rhythm 11/2015.  . Stroke [facility])    a. old L pontine infarcts seen on CT 04/2016.   Past Surgical History  Past Surgical History:  Procedure Laterality Date  . CARDIAC CATHETERIZATION N/A 10/26/2015   Procedure: Left Heart Cath and Coronary Angiography;  Surgeon: Troy Sine, MD;  Location: [facility] CV [facility];  Service: Cardiovascular;  Laterality: N/A;  . CATARACT EXTRACTION W/ INTRAOCULAR LENS  IMPLANT, BILATERAL  2008  . COLONOSCOPY    . CORONARY ANGIOPLASTY WITH STENT PLACEMENT  1988  . CORONARY ARTERY BYPASS GRAFT N/A 10/26/2015   Procedure: CORONARY ARTERY BYPASS GRAFTING (CABG) x3(LIMA to LA, SVG to OM1, SVG to PDA) with EVH from the left thigh and partial lower leg greater saphenous vein and left internal mammary artery;  Surgeon: Ivin Poot, MD;  Location: [facility];  Service: Open Heart Surgery;  Laterality: N/A;  . ESOPHAGOGASTRODUODENOSCOPY (EGD) WITH PROPOFOL N/A 02/11/2016   Procedure: ESOPHAGOGASTRODUODENOSCOPY (EGD) WITH PROPOFOL;  Surgeon: Ladene Artist, MD;  Location: [facility] ENDOSCOPY;  Service: Endoscopy;  Laterality: N/A;  . EUS N/A 03/09/2016   Procedure: UPPER ENDOSCOPIC ULTRASOUND (EUS) RADIAL;  Surgeon: Milus Banister, MD;  Location: [facility] ENDOSCOPY;  Service: Endoscopy;  Laterality: N/A;  . TEE WITHOUT CARDIOVERSION N/A 10/26/2015   Procedure: TRANSESOPHAGEAL ECHOCARDIOGRAM (TEE);  Surgeon: Ivin Poot, MD;  Location: [facility];  Service: Open Heart Surgery;  Laterality: N/A;  .  TONSILLECTOMY     Family History  Family History  Problem Relation Age of Onset  . Diabetes Father   . Colon cancer Neg Hx   . Esophageal cancer Neg Hx   . Stomach cancer Neg Hx   . Rectal cancer Neg Hx   . Allergic rhinitis Neg Hx   . Angioedema Neg Hx   . Asthma Neg Hx   . Eczema Neg Hx   . Immunodeficiency Neg Hx   . Urticaria Neg Hx   . Liver disease Neg Hx    Social History  reports that he quit smoking about 12 years ago. His smoking use included Cigarettes. He has a 60.00 pack-year smoking history. He has never used smokeless tobacco. He reports that he does not drink alcohol or use drugs. Allergies  Allergies  Allergen Reactions  . Amoxicillin Other (See Comments)    PATIENT PASSED OUT   . Cephalexin Other (See Comments)    PATIENT PASSED OUT  . Penicillin G Other (See Comments)    PATIENT PASSED OUT. Has patient had a PCN reaction causing immediate rash, facial/tongue/throat swelling, SOB or lightheadedness with hypotension: Yes Has patient had a PCN reaction causing severe rash involving mucus membranes or skin necrosis: No Has patient had a PCN reaction that required hospitalization: Yes Has patient had a PCN reaction occurring within the last 10 years: Yes If all of the above answers are "NO", then may proceed with Cephalosporin   Home medications Prior to Admission medications   Medication Sig Start Date End Date Taking? Authorizing Provider  atorvastatin (LIPITOR) 80 MG tablet Take 1 tablet (80 mg total) by mouth daily. 03/31/16  Yes Josue Hector, MD  cholecalciferol (VITAMIN D) 1000 units tablet Take 1,000 Units by mouth daily.   Yes [provider]  diphenhydramine-acetaminophen (TYLENOL PM) 25-500 MG TABS tablet Take 2 tablets by mouth at bedtime.   Yes [provider]  fenofibrate 54 MG tablet Take 54 mg by mouth daily.   Yes [provider]  furosemide (LASIX) 40 MG tablet Take 1.5 tablets (60 mg total) by mouth 2 (two) times  daily. 08/24/16 08/24/17 Yes Josue Hector, MD  Glucosamine-Chondroit-Vit C-Mn (GLUCOSAMINE CHONDR 1500 COMPLX PO) Take 2 tablets by mouth daily.    Yes [provider]  Insulin Glargine (TOUJEO SOLOSTAR) 300 UNIT/ML SOPN Inject 30 Units into the skin at bedtime.   Yes [provider]  metoprolol succinate (TOPROL-XL) 50 MG 24 hr tablet Take 1 tablet (50 mg total) by mouth daily. Take with or immediately following a meal. 02/16/16 09/21/16 Yes Weaver, Scott T, PA-C  OXYGEN Inhale 2 L/min into the lungs continuous.   Yes [provider]  pantoprazole (PROTONIX) 40 MG tablet Take 1 tablet (40 mg total) by mouth daily at 6 (six) AM. 02/14/16  Yes Allie Bossier, MD  traMADol (ULTRAM) 50 MG tablet Take by mouth every  6 (six) hours as needed.   Yes [provider]  traZODone (DESYREL) 100 MG tablet Take 100 mg by mouth at bedtime.    Yes [provider]  vitamin C (ASCORBIC ACID) 500 MG tablet Take 500 mg by mouth daily.   Yes [provider]   Liver Function Tests No results for input(s): AST, ALT, ALKPHOS, BILITOT, PROT, ALBUMIN in the last 168 hours. No results for input(s): LIPASE, AMYLASE in the last 168 hours. CBC  Recent Labs Lab 09/21/16 1823 09/22/16 0728 09/23/16 0604  WBC 17.9* 21.6* 17.6*  NEUTROABS  --  18.6*  --   HGB 8.8* 8.7* 9.2*  HCT 29.4* 28.6* 30.2*  MCV 87.5 86.4 86.5  PLT 402* 357 782   Basic Metabolic Panel  Recent Labs Lab 09/21/16 1823 09/22/16 0728 09/23/16 0604 09/24/16 0426  NA 133* 131* 130* 128*  K 4.3 4.0 4.3 4.6  CL 96* 94* 93* 90*  CO2 27 26 23 28   GLUCOSE 115* 111* 126* 151*  BUN 43* 54* 70* 82*  CREATININE 1.83* 2.15* 2.63* 2.45*  CALCIUM 8.4* 8.1* 8.3* 8.1*   Iron/TIBC/Ferritin/ %Sat No results found for: IRON, TIBC, FERRITIN, IRONPCTSAT  Vitals:   09/23/16 2031 09/23/16 2347 09/24/16 0459 09/24/16 1300  BP: 114/70 129/84 (!) 130/58 103/70  Pulse: 100 (!) 115 (!) 123 (!) 43  Resp: 20 20  18 18   Temp: (!) 97.5 F (36.4 C) 97.7 F (36.5 C) (!) 97.5 F (36.4 C) 98 F (36.7 C)  TempSrc: Oral Oral Oral Oral  SpO2: 93% 97% 93% 95%  Weight:    92 kg (202 lb 12.8 oz)  Height:       Exam Gen elderly WM, restless, confused, responsive, Ox 1  No rash, cyanosis or gangrene Sclera anicteric, throat clear  No jvd or bruits Chest dec'd at bases bilat RRR no MRG Abd soft ntnd no mass or ascites +bs GU normal male w condom cath MS no joint effusions or deformity Ext 1-2+ pretib bilat edema / no wounds or ulcers Neuro is alert, Ox 1, gen'd weak   ECHO 9/15 > EF 35- 40%, mild LVH, mild-mod AS, mod-severe MR, LAE, RV fxn severely reduced Renal US - 11 cm kidneys, no hydro, normal bladder CXR 9/13 - mild CHF CXR 9/16 - no change in mild CHF UA 9/14 - negative Baseline creat = 1.5- 18, eGFR 21- 23 ml/min  Impression: 1.  Acute on CKD 4 - due to diuresis vs decomp CHF. Baseline creat 1.5- 1.8, eGFR 22.  Has not diuresed well here, still vol overloaded. No good answer in this case; could try more aggressive diuretics but stand the real chance of worsening renal function.  Pt has severe biventricular HF.  Not dialysis candidate due to comorbidities.  Have d/w primary MD.  Palliative care has been consulted.  Will be available if needed.  2.  Bivent heart failure 3.  Vol overload/ CHF 4.  Perm afib 5.  Hx GIB  6.  DM2 7.  Chron resp failure on home O2    Plan - as above  Kelly Splinter MD [employer] pager [phone]   09/24/2016, 4:57 PM

## 2016-09-24 NOTE — Progress Notes (Signed)
Pt was able to tolerate sitting in a recliner for a long time during afternoon, keep dosing on and off and screaming while changing the position, otherwise no complain of pain, no any CP and SOB, oxygen continue via [place] @2l /min, venous duplex done, condom cath continue, diuresing just fair, will continue to monitor the patient

## 2016-09-24 NOTE — Progress Notes (Signed)
Bladder scan shows only 50 cc maximum urine output, pt doesnot have any urge to urinate, during 7am to 7pm, 350cc urine out put so far, Nephrologist aware

## 2016-09-24 NOTE — Progress Notes (Signed)
VASCULAR LAB PRELIMINARY  PRELIMINARY  PRELIMINARY  PRELIMINARY  Bilateral lower extremity venous duplex completed.    Preliminary report:  There is no obvious evidence of DVT or SVT noted in the bilateral lower extremities. Interstitial fluid noted throughout calf. Waveforms are pulsatile suggestive of fluid overload.   Dashiel Bergquist, RVT 09/24/2016, 3:23 PM

## 2016-09-24 NOTE — Evaluation (Signed)
Physical Therapy Evaluation Patient Details Name: Mark Rivers MRN: [id number] DOB: Jan 03, 1935 Today's Date: 09/24/2016   History of Present Illness  Mark Rivers is a 82 y.o. male with a hx of CAD s/p CABG 10/2015, persistent AFib not on anticoagulation due to prior GI bleed, esophageal adenocarcinoma, DM2, CKD, HTN, HL, COPD, and chronic diastolic CHF, Afib RVR and CHF, FTT with multiple hospitalizations  Clinical Impression   Pt admitted with above diagnosis. Pt currently with functional limitations due to the deficits listed below (see PT Problem List). Noting quite a functional decline this admission compared to the admission of last month; Recommending SNF at this time as he lives alone; noted for Palliative Care Team consult as well, and will follow their lead; At this point Pt will benefit from skilled PT to increase their independence and safety with mobility to allow discharge to the venue listed below.       Follow Up Recommendations SNF;Other (comment) (will watch progress and also take goals of care into account)    Equipment Recommendations  None recommended by PT    Recommendations for Other Services OT consult (placed order per protocol)     Precautions / Restrictions Precautions Precautions: Fall      Mobility  Bed Mobility Overal bed mobility: Needs Assistance Bed Mobility: Supine to Sit     Supine to sit: Min assist     General bed mobility comments: Min handheld assist to pull to sit; noting good attempts at reciprocal scooting, but inefficient and taxing  Transfers Overall transfer level: Needs assistance Equipment used: Rolling walker (2 wheeled) Transfers: Sit to/from Stand Sit to Stand: Mod assist         General transfer comment: Heavy mod assist to rise from low bed, and unsuccessful stand; light mod assist to power up from elevated bed; cues for hand placement and safety  Ambulation/Gait Ambulation/Gait assistance: Min  assist Ambulation Distance (Feet):  (pivot steps bed to chair) Assistive device: Rolling walker (2 wheeled) Gait Pattern/deviations: Shuffle;Trunk flexed     General Gait Details: Cues to self-monitor for activity tolerance; DOE 2/4  Stairs            Wheelchair Mobility    Modified Rankin (Stroke Patients Only)       Balance     Sitting balance-Leahy Scale: Fair       Standing balance-Leahy Scale: Poor                               Pertinent Vitals/Pain Pain Assessment: No/denies pain    Home Living Family/patient expects to be discharged to:: Private residence Living Arrangements: Alone Available Help at Discharge: Available PRN/intermittently Type of Home: House Home Access: Level entry     Home Layout: One level Home Equipment: Environmental consultant - 2 wheels;Shower seat - built in;Other (comment);Adaptive equipment (home O2 and pulse oximeter)      Prior Function Level of Independence: Independent with assistive device(s)         Comments: unsure of accuracy of whether he was using RW or not?     Hand Dominance        Extremity/Trunk Assessment   Upper Extremity Assessment Upper Extremity Assessment: Generalized weakness    Lower Extremity Assessment Lower Extremity Assessment: Generalized weakness       Communication   Communication: HOH  Cognition Arousal/Alertness: Awake/alert Behavior During Therapy: WFL for tasks assessed/performed Overall Cognitive Status: Difficult to assess  General Comments General comments (skin integrity, edema, etc.): Session conducted on supplemental O2 2.5 liters; O2 sats 98% after short rest once in the chair    Exercises     Assessment/Plan    PT Assessment Patient needs continued PT services  PT Problem List Decreased activity tolerance;Decreased mobility;Decreased balance;Decreased knowledge of precautions;Decreased safety  awareness;Decreased knowledge of use of DME       PT Treatment Interventions DME instruction;Gait training;Functional mobility training;Therapeutic activities;Therapeutic exercise;Balance training;Patient/family education    PT Goals (Current goals can be found in the Care Plan section)  Acute Rehab PT Goals Patient Stated Goal: not stated PT Goal Formulation: Patient unable to participate in goal setting Time For Goal Achievement: 10/08/16 Potential to Achieve Goals: Fair    Frequency Min 3X/week   Barriers to discharge Decreased caregiver support Must be independent to safely manage at home    Co-evaluation               AM-PAC PT "6 Clicks" Daily Activity  Outcome Measure Difficulty turning over in bed (including adjusting bedclothes, sheets and blankets)?: A Lot Difficulty moving from lying on back to sitting on the side of the bed? : A Lot Difficulty sitting down on and standing up from a chair with arms (e.g., wheelchair, bedside commode, etc,.)?: Unable Help needed moving to and from a bed to chair (including a wheelchair)?: A Little Help needed walking in hospital room?: A Lot Help needed climbing 3-5 steps with a railing? : Total 6 Click Score: 11    End of Session Equipment Utilized During Treatment: Gait belt;Oxygen Activity Tolerance: Patient limited by fatigue Patient left: in chair;with call bell/phone within reach;with chair alarm set Nurse Communication: Mobility status PT Visit Diagnosis: Unsteadiness on feet (R26.81);Muscle weakness (generalized) (M62.81)    Time: 8469-6295 PT Time Calculation (min) (ACUTE ONLY): 22 min   Charges:   PT Evaluation $PT Eval Moderate Complexity: 1 Mod     PT G Codes:        Roney Marion, PT  Acute Rehabilitation Services Pager [phone] Office [phone]   [place] 09/24/2016, 2:08 PM

## 2016-09-24 NOTE — Progress Notes (Signed)
PROGRESS NOTE  Mark Rivers BPZ:[id number] DOB: 1934-04-19 DOA: 09/21/2016 PCP: Mark Peers, MD  HPI/Recap of past 24 hours:  Seems confused and fidgiting, but currently orientedx3, a friend is in his room Poor urine output last 24hrs , remain edematous  He report right shoulder pain is better, today he is able to lift right arm above his shoulder  Assessment/Plan: Principal Problem:   Acute on chronic respiratory failure with hypoxia ([place]) Active Problems:   Diabetes mellitus with stage 3 chronic kidney disease ([place])   Persistent atrial fibrillation ([place])   Acute on chronic diastolic heart failure ([facility])   Anemia of chronic disease  Acute on chronic diastolic CHF with EF of 24%. Chronic hypoxic respiratory failure on home o2 -cr worsening, still edematous, cardiorenal? Avoid hypotension, reduce betablocker -Bilateral lower extremity edema, recent venous doppler negative for DVT ( done in 08/2016), repeat venous doppler -echo with reduced RV and LV function compare to prior echo in 04/2016, repeat cxr on 9/13 with increased bilateral pleural effusion -increase lasix, cardiology and nephrology consulted. Per cardiology difficult situation, per nephrology , patient is not a candidate for dialysis, palliative care consulted, I have updated daughter over the phone on 9/16.  Chronic atrial fibrillation.  Patient presented with afib/RVr,  now heart rate better controlled but borderline low bp with worsening of renal fucntion decrease betablocker today on 9/15 may require amiodarone for better rate control, [place] vasc 2 score of at least 7 however not on anticoagulation due to GI bleed/esophageal cancer. Cardiology consulted, will follow recommendations  ARF CKD stage IV.  Baseline creatinine appears to be close to 1.5.  Cr worsening, renal US no obstruction, ua on admission no infection , no blood, no leukocytosis Avoid hypotension, reduce betablocker Remain volume overload,  worsening of renal function from cardiorenal? Hypotension? Repeat bmp in am, renal dosing meds Nephrology consulted, not a candidate for dialysis per nephrology.  Hyponatremia: mild , likely from volume overload  continue diuresis  Leukocytosis. Nonspecific. Afebrile, appears nontoxic, UA borderline chest x-ray unremarkable.   Right shoulder pain, report for about 3weeks, report h/o steroid injection He is not able to life right arm above shoulder right shoulder x ray with degenerative changes, no acute findings   Uric acid elevated: consider allopurinol once renal function improves. He is not a good candidate for uloric due to cardiac history.   Insulin dependent dm2,  Continue insulin, a1c7.2, am blood sugar 111-126  CAD s/p urgent CABG in 2017  Recent h/o esophageal adenocarcinoma: Patient had EGD 03/09/2016 with Dr. Ardis Rivers, showing adenocarcinoma. He was referred to Mark Rivers for further intervention/treatment.  Patient subsequently had EGD with on 07/27/2016 with Dr. Mortimer Rivers and had area ablated with spray cryotherapy using liquid nitrogen. - Continue outpatient follow-up  FTT: this is his 5th hospitalization this year   PT eval, now palliative care consulted   Code Status: DNR , confirmed with patient and daughter over the phone  Family Communication: patient ,  patient's daughter Mark Rivers) Mark Rivers over the phone [phone]  Disposition Plan: not ready for discharge   Consultants:  Cardiology  Nephrology  Palliative care  Procedures:  none  Antibiotics:  none   Objective: BP (!) 130/58 (BP Location: Left Arm)   Pulse (!) 123   Temp (!) 97.5 F (36.4 C) (Oral)   Resp 18   Ht 5\' 7"  (1.702 m)   Wt 91.6 kg (202 lb)   SpO2 93%   BMI 31.64 kg/m   Intake/Output Summary (  Last 24 hours) at 09/24/16 1150 Last data filed at 09/24/16 3818  Gross per 24 hour  Intake              560 ml  Output              775 ml  Net             -215 ml   Filed  Weights   09/21/16 2051 09/22/16 1417 09/23/16 0749  Weight: 90.3 kg (199 lb) 90.6 kg (199 lb 11.2 oz) 91.6 kg (202 lb)    Exam: Patient is examined daily including today on 09/24/2016, exams remain the same as of yesterday except that has changed    General:  Frail , chronically ill appearing, elderly male, on o2 supplement, hard of hearing, confused  Cardiovascular: IRRR  Respiratory: rhonchi? Vs upper airway sounds, crackles, no wheezing  Abdomen: Soft/ND/NT, positive BS  Musculoskeletal: bilateral lower extremity pitting Edema  Neuro: confused, but able to tell me the right time/place and person  Data Reviewed: Basic Metabolic Panel:  Recent Labs Lab 09/21/16 1823 09/22/16 0728 09/23/16 0604 09/24/16 0426  NA 133* 131* 130* 128*  K 4.3 4.0 4.3 4.6  CL 96* 94* 93* 90*  CO2 27 26 23 28   GLUCOSE 115* 111* 126* 151*  BUN 43* 54* 70* 82*  CREATININE 1.83* 2.15* 2.63* 2.45*  CALCIUM 8.4* 8.1* 8.3* 8.1*  MG 1.9  --  2.3  --    Liver Function Tests: No results for input(s): AST, ALT, ALKPHOS, BILITOT, PROT, ALBUMIN in the last 168 hours. No results for input(s): LIPASE, AMYLASE in the last 168 hours. No results for input(s): AMMONIA in the last 168 hours. CBC:  Recent Labs Lab 09/21/16 1823 09/22/16 0728 09/23/16 0604  WBC 17.9* 21.6* 17.6*  NEUTROABS  --  18.6*  --   HGB 8.8* 8.7* 9.2*  HCT 29.4* 28.6* 30.2*  MCV 87.5 86.4 86.5  PLT 402* 357 380   Cardiac Enzymes:    Recent Labs Lab 09/22/16 0241 09/22/16 0728 09/22/16 1406  TROPONINI 0.21* 0.20* 0.21*   BNP (last 3 results)  Recent Labs  08/18/16 1058 08/31/16 1314 09/21/16 1823  BNP 1,191.6* 2,085.6* 1,875.7*    ProBNP (last 3 results) No results for input(s): PROBNP in the last 8760 hours.  CBG:  Recent Labs Lab 09/23/16 0747 09/23/16 1234 09/23/16 1647 09/23/16 2139 09/24/16 0711  GLUCAP 128* 115* 127* 127* 148*    No results found for this or any previous visit (from the  past 240 hour(s)).   Studies: No results found.  Scheduled Meds: . diphenhydrAMINE  50 mg Oral QHS   And  . acetaminophen  1,000 mg Oral QHS  . atorvastatin  80 mg Oral Daily  . enoxaparin (LOVENOX) injection  30 mg Subcutaneous Daily  . fenofibrate  54 mg Oral Daily  . insulin aspart  0-9 Units Subcutaneous TID WC  . insulin glargine  30 Units Subcutaneous QHS  . metoprolol succinate  25 mg Oral Daily  . pantoprazole  40 mg Oral Q0600  . sodium chloride flush  3 mL Intravenous Q12H  . traZODone  100 mg Oral QHS  . vitamin C  500 mg Oral Daily    Continuous Infusions: . sodium chloride       Time spent: 35 mins I have personally reviewed and interpreted on  09/24/2016 daily labs, tele strips, imagings as discussed above under data review session and assessment and plans.  I reviewed  all nursing notes, pharmacy notes, consultant notes,  vitals, pertinent old records  I have discussed plan of care as described above with cardiology/nephrology,  Patient and patient's daughter on 09/24/2016   Taiz Bickle MD, PhD  [facility] Pager [phone]. If 7PM-7AM, please contact night-coverage at [email or web address], password [facility] 09/24/2016, 11:50 AM  LOS: 2 days

## 2016-09-24 NOTE — Progress Notes (Signed)
Progress Note  Patient Name: Mark Rivers Date of Encounter: 09/24/2016  Primary Cardiologist: Raynaldo Opitz    Patient Profile:   Mark Rivers is a 82 y.o. male with a hx of CAD s/p CABG 10/2015, permanent AFib not on anticoagulation due to prior GI bleed, esophageal adenocarcinoma, DM2, CKD, HTN, HL, COPD, and chronic diastolic CHF  Hospitalized  8/23-8/25 for acute CHF. Diuresed 3.6L. Discharge weight 196lb with gradual re accumulation of edema and low O2 Sats Admitted 9/13  Afib RVR 140s and CHF Consult 9/14 for above and worsening renal function  Cr 2.15<<1.83.  BB continued and diuretics held  4/18 Echo EF 55-60 mod LVH and severe BAE    10/18 Echo EF 35-40% with RV hypokinesis; no effusion   Subjective   SOB better but confused  Inpatient Medications    Scheduled Meds: . diphenhydrAMINE  50 mg Oral QHS   And  . acetaminophen  1,000 mg Oral QHS  . atorvastatin  80 mg Oral Daily  . enoxaparin (LOVENOX) injection  30 mg Subcutaneous Daily  . fenofibrate  54 mg Oral Daily  . insulin aspart  0-9 Units Subcutaneous TID WC  . insulin glargine  30 Units Subcutaneous QHS  . metoprolol succinate  25 mg Oral Daily  . pantoprazole  40 mg Oral Q0600  . sodium chloride flush  3 mL Intravenous Q12H  . traZODone  100 mg Oral QHS  . vitamin C  500 mg Oral Daily   Continuous Infusions: . sodium chloride     PRN Meds: sodium chloride, acetaminophen, guaiFENesin-dextromethorphan, metoprolol tartrate, ondansetron (ZOFRAN) IV, sodium chloride flush, traMADol   Vital Signs    Vitals:   09/23/16 1207 09/23/16 2031 09/23/16 2347 09/24/16 0459  BP: 101/75 114/70 129/84 (!) 130/58  Pulse: 82 100 (!) 115 (!) 123  Resp: 18 20 20 18   Temp: 98 F (36.7 C) (!) 97.5 F (36.4 C) 97.7 F (36.5 C) (!) 97.5 F (36.4 C)  TempSrc: Oral Oral Oral Oral  SpO2: 100% 93% 97% 93%  Weight:      Height:        Intake/Output Summary (Last 24 hours) at 09/24/16 1219 Last data filed at  09/24/16 0648  Gross per 24 hour  Intake              560 ml  Output              475 ml  Net               85 ml   Filed Weights   09/21/16 2051 09/22/16 1417 09/23/16 0749  Weight: 199 lb (90.3 kg) 199 lb 11.2 oz (90.6 kg) 202 lb (91.6 kg)    Telemetry    afib HR aobut 100-110  Personally Reviewed  ECG       Physical Exam   Well developed and nourished in mod but less respir distress Somewhat confused HENT normal Neck supple with JVP-10 Carotids brisk and full without bruits Rapid and irregular Coarse  Abd-soft with active BS without hepatomegaly No Clubbing cyanosis edema Skin-warm and dry A & Oriented  Grossly normal sensory and motor function   Labs    Chemistry  Recent Labs Lab 09/22/16 0728 09/23/16 0604 09/24/16 0426  NA 131* 130* 128*  K 4.0 4.3 4.6  CL 94* 93* 90*  CO2 26 23 28   GLUCOSE 111* 126* 151*  BUN 54* 70* 82*  CREATININE 2.15* 2.63* 2.45*  CALCIUM 8.1* 8.3* 8.1*  GFRNONAA 27* 21* 23*  GFRAA 32* 25* 27*  ANIONGAP 11 14 10      Hematology  Recent Labs Lab 09/21/16 1823 09/22/16 0728 09/23/16 0604  WBC 17.9* 21.6* 17.6*  RBC 3.36* 3.31* 3.49*  HGB 8.8* 8.7* 9.2*  HCT 29.4* 28.6* 30.2*  MCV 87.5 86.4 86.5  MCH 26.2 26.3 26.4  MCHC 29.9* 30.4 30.5  RDW 17.5* 17.4* 17.4*  PLT 402* 357 380    Cardiac Enzymes  Recent Labs Lab 09/22/16 0241 09/22/16 0728 09/22/16 1406  TROPONINI 0.21* 0.20* 0.21*     Recent Labs Lab 09/21/16 1837 09/21/16 2200  TROPIPOC 0.08 0.05     BNP  Recent Labs Lab 09/21/16 1823  BNP 1,875.7*     DDimer No results for input(s): DDIMER in the last 168 hours.   Radiology    No results found.  Cardiac Studies        Assessment & Plan    Atrial Fibrillation-permanent  Congestive heart failure acute/chronic-diastolic/systolic  Right heart failure-- acute  Renal injury-acute on chronic  Anemia-chronic  Dyspnea   Esophogeal Ca/GI bleeding not a candidate for GI bleeding    Very difficult situation--His physiology behaving with R heart failure and L Heart hypoperfusion.  I was surprised that LV function is depressed, but R heart failure raises concern about PE esp in context of Cancer.  Unfortunately he would not be a candidate for anticoagulation, and hence not filter, so looking is more of value to exclude or include the diagnosis Spoke  With Dr  Erlinda Hong Would thus back off on diuresis at this point, as increasing HR suggests LV volume underfilling and will check CXR again to see if helps explain dyspnea without implicating LV contribution.  His afib could be the cause of Biventricular failure but overall HR not excessively fast    Pulm input might be useful depending on imaging results   Signed, Virl Axe, MD  09/24/2016, 12:19 PM

## 2016-09-25 DIAGNOSIS — N189 Chronic kidney disease, unspecified: Secondary | ICD-10-CM

## 2016-09-25 DIAGNOSIS — R531 Weakness: Secondary | ICD-10-CM

## 2016-09-25 DIAGNOSIS — E1122 Type 2 diabetes mellitus with diabetic chronic kidney disease: Secondary | ICD-10-CM

## 2016-09-25 DIAGNOSIS — N183 Chronic kidney disease, stage 3 (moderate): Secondary | ICD-10-CM

## 2016-09-25 LAB — BASIC METABOLIC PANEL
ANION GAP: 11 (ref 5–15)
BUN: 89 mg/dL — AB (ref 6–20)
CALCIUM: 8.3 mg/dL — AB (ref 8.9–10.3)
CO2: 27 mmol/L (ref 22–32)
Chloride: 93 mmol/L — ABNORMAL LOW (ref 101–111)
Creatinine, Ser: 2.41 mg/dL — ABNORMAL HIGH (ref 0.61–1.24)
GFR calc Af Amer: 28 mL/min — ABNORMAL LOW (ref >60)
GFR, EST NON AFRICAN AMERICAN: 24 mL/min — AB (ref >60)
GLUCOSE: 142 mg/dL — AB (ref 65–99)
Potassium: 4.7 mmol/L (ref 3.5–5.1)
SODIUM: 131 mmol/L — AB (ref 135–145)

## 2016-09-25 LAB — CBC
HCT: 30.8 % — ABNORMAL LOW (ref 39.0–52.0)
Hemoglobin: 9.4 g/dL — ABNORMAL LOW (ref 13.0–17.0)
MCH: 26 pg (ref 26.0–34.0)
MCHC: 30.5 g/dL (ref 30.0–36.0)
MCV: 85.3 fL (ref 78.0–100.0)
Platelets: 368 10*3/uL (ref 150–400)
RBC: 3.61 MIL/uL — ABNORMAL LOW (ref 4.22–5.81)
RDW: 17.3 % — AB (ref 11.5–15.5)
WBC: 18.1 10*3/uL — ABNORMAL HIGH (ref 4.0–10.5)

## 2016-09-25 LAB — HEPATIC FUNCTION PANEL
ALBUMIN: 2.7 g/dL — AB (ref 3.5–5.0)
ALT: 380 U/L — ABNORMAL HIGH (ref 17–63)
AST: 564 U/L — ABNORMAL HIGH (ref 15–41)
Alkaline Phosphatase: 125 U/L (ref 38–126)
Bilirubin, Direct: 0.6 mg/dL — ABNORMAL HIGH (ref 0.1–0.5)
Indirect Bilirubin: 0.8 mg/dL (ref 0.3–0.9)
TOTAL PROTEIN: 6.1 g/dL — AB (ref 6.5–8.1)
Total Bilirubin: 1.4 mg/dL — ABNORMAL HIGH (ref 0.3–1.2)

## 2016-09-25 LAB — GLUCOSE, CAPILLARY
GLUCOSE-CAPILLARY: 133 mg/dL — AB (ref 65–99)
GLUCOSE-CAPILLARY: 139 mg/dL — AB (ref 65–99)
Glucose-Capillary: 118 mg/dL — ABNORMAL HIGH (ref 65–99)
Glucose-Capillary: 136 mg/dL — ABNORMAL HIGH (ref 65–99)

## 2016-09-25 LAB — AMMONIA: AMMONIA: 31 umol/L (ref 9–35)

## 2016-09-25 MED ORDER — METOPROLOL SUCCINATE ER 25 MG PO TB24
25.0000 mg | ORAL_TABLET | Freq: Every day | ORAL | Status: DC
  Administered 2016-09-26: 25 mg via ORAL
  Filled 2016-09-25: qty 1

## 2016-09-25 NOTE — Progress Notes (Signed)
Pt is stable throughout the shift, tramadol given for a complain of back pain, vitals stable, no complain of chest pain, OT worked with him, stayed in a recliner for a while, not a good output, will continue to monitor

## 2016-09-25 NOTE — Progress Notes (Addendum)
PROGRESS NOTE  Mark Rivers BTD:[id number] DOB: 03-Dec-1934 DOA: 09/21/2016 PCP: [name], MD  Brief narrative: 82 year old male with PMH of persistent A. fib poor candidate for anticoagulation due to bleeding risks, CAD, CVA, chronic diastolic CHF with last EF 55-60 percent, chronic respiratory failure on home oxygen 2-3 L/m, type II DM, HTN, HLD GI bleed in March 2018, esophageal adenocarcinoma, stage III chronic kidney disease, recent hospitalization 8/23-8/25 for decompensated CHF, presented to ED on 09/22/16 with worsening dyspnea, orthopnea and leg edema. He was admitted for acute on chronic diastolic CHF. Cardiology, nephrology and palliative care consulted.  Assessment/Plan: Principal Problem:   Acute on chronic respiratory failure with hypoxia ([facility]) Active Problems:   Diabetes mellitus with stage 3 chronic kidney disease ([facility])   Persistent atrial fibrillation ([facility])   Acute on chronic diastolic congestive heart failure ([facility])   Anemia of chronic disease  1. Acute on chronic combined systolic and diastolic CHF: Cardiology follow-up appreciated. They indicated that his physiology is behaving with right heart failure and left heart hypoperfusion, PE in the context of cancer would be of concern but patient not candidate for anticoagulation. They recommended decreasing diuretics as increasing heart rate suggested LV volume underfilling. Despite IV Lasix, has not diuresed well. As per nephrology, no good answer in this case, could try more aggressive diuretics but run the risk of worsening renal function. Palliative care consulted for goals. Echo this admission showed EF 35-40 percent with diffuse hypokinesis, reduced from 55-60 percent in April 2018. 2. Acute on chronic respiratory failure with hypoxia: Secondary to decompensated CHF. Management as above. 3. Persistent A. fib with RVR: Continue metoprolol but dose decreased and then increased to 25 MG daily. Not candidate for  anticoagulation. Ventricular rate mostly in the 100s. 4. Acute on stage IV chronic kidney disease: Baseline creatinine appears to be close to 1.5. Nephrology input 9/16 appreciated. Not a candidate for dialysis. Palliative care input requested. Renal ultrasound without obstruction. 5. CAD status post CABG/elevated troponin: No chest pain. Flat trend. Likely demand ischemia related to decompensated CHF, A. fib with RVR and acute on chronic hypoxic respiratory failure. 6. DM type II with renal complications: Good inpatient control on current dose of Lantus and SSI. Continue. A1c 7.2. 7. Anemia of chronic disease: Stable. 8. Esophageal adenocarcinoma: Patient had EGD 03/09/16 by Dr. [name] which showed adenocarcinoma. He was referred to [facility] for further intervention/treatment. Patient subsequently had EGD on 07/27/16 with Dr. Mortimer Fries and he had area ablated with spray cryotherapy using liquid nitrogen. Outpatient follow-up. 9. Dyslipidemia: 10. Hyponatremia: Stable. Likely hypervolemic hyponatremia. 11. GERD: 12. Leukocytosis: Unclear etiology. No overt clinical source of infection.? Stress response. Has been stable for the last couple of days. 13. Right shoulder pain: Ongoing for about 3 weeks and reports history of steroid injection. X-ray showed degenerative changes without acute findings. May be better today. 14. Elevated uric acid: Do not believe his right shoulder pain is due to acute gout. May consider allopurinol if renal functions improved. 15. Adult failure to thrive: Multifactorial due to multiple severe significant comorbidities, advanced age and fragility. Poor overall prognosis. Palliative care input appreciated. Discussed with palliative care team on 9/17. 16. Transaminitis:? Passive congestion due to CHF versus other etiologies. Follow LFTs in a.m. Lipitor stopped.   DVT prophylaxis: Lovenox Code Status: DNR Family Communication: None at bedside today. Patient's daughter  Chauncey Reading) Rod Holler over the phone [phone]  Disposition Plan: not ready for discharge   Consultants:  Cardiology  Nephrology  Palliative care  Procedures:  none  Antibiotics:  none   Subjective  Appears confused. Denies dyspnea or CP. No N/V.  Objective: BP (!) 114/53 (BP Location: Left Arm)   Pulse 88   Temp 98.3 F (36.8 C) (Oral)   Resp 18   Ht 5\' 7"  (1.702 m)   Wt 91.1 kg (200 lb 12.8 oz)   SpO2 91%   BMI 31.45 kg/m   Intake/Output Summary (Last 24 hours) at 09/25/16 1810 Last data filed at 09/25/16 1427  Gross per 24 hour  Intake              700 ml  Output              325 ml  Net              375 ml   Filed Weights   09/23/16 0749 09/24/16 1300 09/25/16 0347  Weight: 91.6 kg (202 lb) 92 kg (202 lb 12.8 oz) 91.1 kg (200 lb 12.8 oz)    Exam:    General:  Frail , chronically ill appearing, elderly male, on o2 supplement, hard of hearing, confused. Does not appear in overt distress.  Cardiovascular: S1 and S2 heard, irregularly irregular. No JVD or murmurs. 1+ bilateral leg edema. Telemetry: A. fib with ventricular rate ranging between 80s-100s.  Respiratory: Diminished breath sounds in the bases with few bibasal crackles. Rest of lung fields clear to auscultation. No increased work of breathing.  Abdomen: Soft/ND/NT, positive BS. Stable  Musculoskeletal: bilateral lower extremity pitting Edema. Moves all extremities symmetrically.   Neuro: alert and oriented to person and place. No focal neurological deficits.   Psychiatry: Insight impaired. Mood flat.  Data Reviewed: Basic Metabolic Panel:  Recent Labs Lab 09/21/16 1823 09/22/16 0728 09/23/16 0604 09/24/16 0426 09/25/16 0448  NA 133* 131* 130* 128* 131*  K 4.3 4.0 4.3 4.6 4.7  CL 96* 94* 93* 90* 93*  CO2 27 26 23 28 27   GLUCOSE 115* 111* 126* 151* 142*  BUN 43* 54* 70* 82* 89*  CREATININE 1.83* 2.15* 2.63* 2.45* 2.41*  CALCIUM 8.4* 8.1* 8.3* 8.1* 8.3*  MG 1.9  --  2.3  --   --     Liver Function Tests:  Recent Labs Lab 09/25/16 0448  AST 564*  ALT 380*  ALKPHOS 125  BILITOT 1.4*  PROT 6.1*  ALBUMIN 2.7*   No results for input(s): LIPASE, AMYLASE in the last 168 hours.  Recent Labs Lab 09/25/16 0448  AMMONIA 31   CBC:  Recent Labs Lab 09/21/16 1823 09/22/16 0728 09/23/16 0604 09/25/16 0448  WBC 17.9* 21.6* 17.6* 18.1*  NEUTROABS  --  18.6*  --   --   HGB 8.8* 8.7* 9.2* 9.4*  HCT 29.4* 28.6* 30.2* 30.8*  MCV 87.5 86.4 86.5 85.3  PLT 402* 357 380 368   Cardiac Enzymes:    Recent Labs Lab 09/22/16 0241 09/22/16 0728 09/22/16 1406  TROPONINI 0.21* 0.20* 0.21*   BNP (last 3 results)  Recent Labs  08/18/16 1058 08/31/16 1314 09/21/16 1823  BNP 1,191.6* 2,085.6* 1,875.7*    ProBNP (last 3 results) No results for input(s): PROBNP in the last 8760 hours.  CBG:  Recent Labs Lab 09/24/16 1637 09/24/16 2041 09/25/16 0820 09/25/16 1144 09/25/16 1621  GLUCAP 138* 136* 133* 139* 118*    No results found for this or any previous visit (from the past 240 hour(s)).   Studies: No results found.  Scheduled Meds: . diphenhydrAMINE  50  mg Oral QHS   And  . acetaminophen  1,000 mg Oral QHS  . enoxaparin (LOVENOX) injection  30 mg Subcutaneous Daily  . fenofibrate  54 mg Oral Daily  . furosemide  80 mg Intravenous TID  . insulin aspart  0-9 Units Subcutaneous TID WC  . insulin glargine  30 Units Subcutaneous QHS  . [START ON 09/26/2016] metoprolol succinate  25 mg Oral Daily  . pantoprazole  40 mg Oral Q0600  . sodium chloride flush  3 mL Intravenous Q12H  . traZODone  100 mg Oral QHS  . vitamin C  500 mg Oral Daily    Continuous Infusions: . sodium chloride       Time spent: 35 mins   Anacarolina Evelyn, MD, FACP, FHM. [facility] Pager [phone]  If 7PM-7AM, please contact night-coverage [email or web address] Password [email or web address] 09/25/2016, 6:30 PM

## 2016-09-25 NOTE — Evaluation (Signed)
Occupational Therapy Evaluation Patient Details Name: Mark Rivers MRN: [id number] DOB: 1934/06/05 Today's Date: 09/25/2016    History of Present Illness Mark Rivers is a 82 y.o. male with a hx of CAD s/p CABG 10/2015, persistent AFib not on anticoagulation due to prior GI bleed, esophageal adenocarcinoma, DM2, CKD, HTN, HL, COPD, and chronic diastolic CHF, Afib RVR and CHF, FTT with multiple hospitalizations   Clinical Impression   Pt reports he was managing BADL independently PTA. Currently pt requires mod assist for stand pivot transfer, min assist for UB ADL in sitting, and max assist for LB ADL. DOE 2/4 on 3L supplemental O2. Recommending SNF for follow up to maximize independence and safety with ADL and functional mobility prior to return home alone. Pt would benefit from continued skilled OT to address established goals.    Follow Up Recommendations  SNF;Supervision/Assistance - 24 hour    Equipment Recommendations  Other (comment) (TBD at next venue)    Recommendations for Other Services       Precautions / Restrictions Precautions Precautions: Fall Restrictions Weight Bearing Restrictions: No      Mobility Bed Mobility Overal bed mobility: Needs Assistance Bed Mobility: Supine to Sit     Supine to sit: Min assist     General bed mobility comments: Cues for hand placement and bed mobility technique. Min assist for trunk elevation to sitting.  Transfers Overall transfer level: Needs assistance Equipment used: Rolling walker (2 wheeled) Transfers: Sit to/from [facility] Sit to Stand: Mod assist Stand pivot transfers: Mod assist       General transfer comment: Mod assist to boost up from EOB. Cues for hand placement and technique. Mod assist for pivot to chair due to balance and fatigue.    Balance Overall balance assessment: Needs assistance;History of Falls Sitting-balance support: Bilateral upper extremity supported;Feet  supported Sitting balance-Leahy Scale: Poor Sitting balance - Comments: Min guard thoughout; unsteady with posterior lean at times Postural control: Posterior lean Standing balance support: Bilateral upper extremity supported Standing balance-Leahy Scale: Poor Standing balance comment: RW and mod assist for standing balance                           ADL either performed or assessed with clinical judgement   ADL Overall ADL's : Needs assistance/impaired Eating/Feeding: Set up;Sitting   Grooming: Minimal assistance;Sitting   Upper Body Bathing: Minimal assistance;Sitting   Lower Body Bathing: Maximal assistance;Sit to/from stand   Upper Body Dressing : Minimal assistance;Sitting   Lower Body Dressing: Maximal assistance;Sit to/from stand   Toilet Transfer: Moderate assistance;Stand-pivot;RW Toilet Transfer Details (indicate cue type and reason): Simulated by transfer EOB>chair         Functional mobility during ADLs: Moderate assistance;Rolling walker General ADL Comments: DOE 2/4 on 3L supplemental O2.     Vision         Perception     Praxis      Pertinent Vitals/Pain Pain Assessment: No/denies pain     Hand Dominance     Extremity/Trunk Assessment Upper Extremity Assessment Upper Extremity Assessment: Generalized weakness   Lower Extremity Assessment Lower Extremity Assessment: Defer to PT evaluation   Cervical / Trunk Assessment Cervical / Trunk Assessment: Kyphotic   Communication Communication Communication: HOH   Cognition Arousal/Alertness: Awake/alert Behavior During Therapy: WFL for tasks assessed/performed Overall Cognitive Status: No family/caregiver present to determine baseline cognitive functioning Area of Impairment: Orientation;Memory;Following commands;Safety/judgement;Problem solving  Orientation Level: Disoriented to;Time (reports 2013, then 2015, then 2018)   Memory: Decreased short-term  memory Following Commands: Follows one step commands consistently;Follows one step commands with increased time Safety/Judgement: Decreased awareness of safety   Problem Solving: Slow processing;Decreased initiation;Requires verbal cues     General Comments       Exercises     Shoulder Instructions      Home Living Family/patient expects to be discharged to:: Private residence Living Arrangements: Alone Available Help at Discharge: Available PRN/intermittently Type of Home: House Home Access: Level entry     Home Layout: One level     Bathroom Shower/Tub: Walk-in shower         Home Equipment: Environmental consultant - 2 wheels;Shower seat - built in;Other (comment);Adaptive equipment (home O2 and pulse oximeter) Adaptive Equipment: Sock aid        Prior Functioning/Environment Level of Independence: Independent with assistive device(s)        Comments: Pt reports he has been using RW at home. Independent with BADL; friends bring in food.        OT Problem List: Decreased strength;Decreased activity tolerance;Impaired balance (sitting and/or standing);Decreased cognition;Decreased safety awareness;Decreased knowledge of use of DME or AE;Decreased knowledge of precautions;Cardiopulmonary status limiting activity      OT Treatment/Interventions: Self-care/ADL training;Therapeutic exercise;Energy conservation;DME and/or AE instruction;Therapeutic activities;Cognitive remediation/compensation;Patient/family education;Balance training    OT Goals(Current goals can be found in the care plan section) Acute Rehab OT Goals Patient Stated Goal: get stronger OT Goal Formulation: With patient Time For Goal Achievement: 10/09/16 Potential to Achieve Goals: Good ADL Goals Pt Will Perform Grooming: with min guard assist;sitting Pt Will Perform Upper Body Bathing: with min guard assist;sitting Pt Will Perform Lower Body Bathing: with min assist;sit to/from stand Pt Will Transfer to Toilet:  with min assist;ambulating;bedside commode Pt Will Perform Toileting - Clothing Manipulation and hygiene: with min assist;sit to/from stand  OT Frequency: Min 2X/week   Barriers to D/C: Decreased caregiver support  per pt report, he lives alone       Co-evaluation              AM-PAC PT "6 Clicks" Daily Activity     Outcome Measure Help from another person eating meals?: A Little Help from another person taking care of personal grooming?: A Little Help from another person toileting, which includes using toliet, bedpan, or urinal?: A Lot Help from another person bathing (including washing, rinsing, drying)?: A Lot Help from another person to put on and taking off regular upper body clothing?: A Little Help from another person to put on and taking off regular lower body clothing?: A Lot 6 Click Score: 15   End of Session Equipment Utilized During Treatment: Gait belt;Rolling walker;Oxygen Nurse Communication: Mobility status  Activity Tolerance: Patient limited by fatigue Patient left: in chair;with call bell/phone within reach;with chair alarm set  OT Visit Diagnosis: Unsteadiness on feet (R26.81);Other abnormalities of gait and mobility (R26.89);History of falling (Z91.81);Muscle weakness (generalized) (M62.81)                Time: 5701-7793 OT Time Calculation (min): 14 min Charges:  OT General Charges $OT Visit: 1 Visit OT Evaluation $OT Eval Moderate Complexity: 1 Mod G-Codes:     Nik Gorrell A. Ulice Brilliant, M.S., OTR/L Pager: [place] 09/25/2016, 8:54 AM

## 2016-09-25 NOTE — Clinical Social Work Note (Signed)
CSW spoke with Wadie Lessen, NP with palliative team. She has called patient's son and plans on having a conference call with him and patient's daughter. CSW will follow for recommendations before pursuing SNF.  Dayton Scrape, [place]

## 2016-09-25 NOTE — Progress Notes (Signed)
Progress Note  Patient Name: Mark Rivers Date of Encounter: 09/25/2016  Primary Cardiologist: Dr. Johnsie Cancel  Subjective   Breathing improved but still not at baseline. No chest pain or palpitations. Patient's neighbor is at the bedside and provides most of the history. She is concerned about his ability to return home due to his physical limitations and episodes of AMS.    Inpatient Medications    Scheduled Meds: . diphenhydrAMINE  50 mg Oral QHS   And  . acetaminophen  1,000 mg Oral QHS  . atorvastatin  80 mg Oral Daily  . enoxaparin (LOVENOX) injection  30 mg Subcutaneous Daily  . fenofibrate  54 mg Oral Daily  . furosemide  80 mg Intravenous TID  . insulin aspart  0-9 Units Subcutaneous TID WC  . insulin glargine  30 Units Subcutaneous QHS  . metoprolol succinate  12.5 mg Oral Daily  . pantoprazole  40 mg Oral Q0600  . sodium chloride flush  3 mL Intravenous Q12H  . traZODone  100 mg Oral QHS  . vitamin C  500 mg Oral Daily   Continuous Infusions: . sodium chloride     PRN Meds: sodium chloride, acetaminophen, guaiFENesin-dextromethorphan, metoprolol tartrate, ondansetron (ZOFRAN) IV, sodium chloride flush, traMADol   Vital Signs    Vitals:   09/24/16 1954 09/25/16 0347 09/25/16 0842 09/25/16 1221  BP: (!) 143/94 129/85 120/80 (!) 114/53  Pulse: (!) 109 (!) 101 100 88  Resp: 20 20  18   Temp: 97.9 F (36.6 C) 97.7 F (36.5 C)  98.3 F (36.8 C)  TempSrc: Oral Oral  Oral  SpO2: 97% 98%  91%  Weight:  200 lb 12.8 oz (91.1 kg)    Height:        Intake/Output Summary (Last 24 hours) at 09/25/16 1257 Last data filed at 09/25/16 0909  Gross per 24 hour  Intake              940 ml  Output              525 ml  Net              415 ml   Filed Weights   09/23/16 0749 09/24/16 1300 09/25/16 0347  Weight: 202 lb (91.6 kg) 202 lb 12.8 oz (92 kg) 200 lb 12.8 oz (91.1 kg)    Telemetry    Atrial fibrillation, HR in 80's to 120's.  - Personally Reviewed  ECG    No new tracings.   Physical Exam   General: Well developed, well nourished, male appearing in no acute distress. Head: Normocephalic, atraumatic.  Neck: Supple without bruits, JVD at 9cm. Lungs:  Resp regular and unlabored, rales at bases bilaterally. Heart: Irregularly irregular, S1, S2, no S3, S4, or murmur; no rub. Abdomen: Soft, non-tender, non-distended with normoactive bowel sounds. No hepatomegaly. No rebound/guarding. No obvious abdominal masses. Extremities: No clubbing or cyanosis, trace lower extremity edema. Distal pedal pulses are 2+ bilaterally. Neuro: Alert and oriented X 3. Moves all extremities spontaneously. Psych: Normal affect.  Labs    Chemistry Recent Labs Lab 09/23/16 0604 09/24/16 0426 09/25/16 0448  NA 130* 128* 131*  K 4.3 4.6 4.7  CL 93* 90* 93*  CO2 23 28 27   GLUCOSE 126* 151* 142*  BUN 70* 82* 89*  CREATININE 2.63* 2.45* 2.41*  CALCIUM 8.3* 8.1* 8.3*  PROT  --   --  6.1*  ALBUMIN  --   --  2.7*  AST  --   --  564*  ALT  --   --  380*  ALKPHOS  --   --  125  BILITOT  --   --  1.4*  GFRNONAA 21* 23* 24*  GFRAA 25* 27* 28*  ANIONGAP 14 10 11      Hematology Recent Labs Lab 09/22/16 0728 09/23/16 0604 09/25/16 0448  WBC 21.6* 17.6* 18.1*  RBC 3.31* 3.49* 3.61*  HGB 8.7* 9.2* 9.4*  HCT 28.6* 30.2* 30.8*  MCV 86.4 86.5 85.3  MCH 26.3 26.4 26.0  MCHC 30.4 30.5 30.5  RDW 17.4* 17.4* 17.3*  PLT 357 380 368    Cardiac Enzymes Recent Labs Lab 09/22/16 0241 09/22/16 0728 09/22/16 1406  TROPONINI 0.21* 0.20* 0.21*    Recent Labs Lab 09/21/16 1837 09/21/16 2200  TROPIPOC 0.08 0.05     BNP Recent Labs Lab 09/21/16 1823  BNP 1,875.7*     DDimer No results for input(s): DDIMER in the last 168 hours.   Radiology    Dg Chest Port 1 View  Result Date: 09/24/2016 CLINICAL DATA:  Hypoxia. EXAM: PORTABLE CHEST 1 VIEW COMPARISON:  09/21/2016 FINDINGS: Prior median sternotomy. Midline trachea. Cardiomegaly accentuated by AP  portable technique. Developing small left pleural effusion. Probable layering right pleural effusion, small. No pneumothorax. Low lung volumes. Interstitial edema is greater on the right and slightly increased. Left base volume loss with developing airspace disease in both lung bases. IMPRESSION: Slightly worsened aeration, with increased asymmetric interstitial edema. Developing left and likely right pleural effusions with bibasilar airspace disease, favoring atelectasis. Electronically Signed   By: Abigail Miyamoto M.D.   On: 09/24/2016 15:54    Cardiac Studies   Echocardiogram: 09/2016 Study Conclusions  - Left ventricle: The cavity size was normal. Wall thickness was   increased in a pattern of mild LVH. Systolic function was   moderately reduced. The estimated ejection fraction was in the   range of 35% to 40%. Diffuse hypokinesis. The study is not   technically sufficient to allow evaluation of LV diastolic   function. - Aortic valve: Moderately to severely calcified annulus. Mildly   thickened, mildly calcified leaflets. Morphologically, it appears   there is at least mild if not mild to moderate aortic valvular   stenosis. - Mitral valve: Calcified annulus. There was moderate to severe   regurgitation. - Left atrium: The atrium was severely dilated. - Right ventricle: The cavity size was moderately dilated. Systolic   function was severely reduced. - Right atrium: The atrium was moderately dilated. - Atrial septum: No defect or patent foramen ovale was identified. - Tricuspid valve: There was moderate regurgitation.  Patient Profile     82 y.o. male w/ PMH of CAD (s/p CABG in 10/2015), persistent atrial fibrillation (not on anticoagulation due to history of GIB), chronic combined systolic and diastolic CHF, esophageal adenocarcinoma, HTN, HLD, Type 2 DM, and COPD who presented [facility] ED on 09/21/2016 for worsening orthopnea and edema, found to be in atrial fibrillation with  RVR.   Assessment & Plan   1. Acute on Chronic Combined Systolic and diastolic CHF - echo this admission shows a reduced EF of 35-40% with diffuse HK, EF previously 55-60% by echo in 04/2016. - BNP elevated to 1875 on admission. Weight at time of hospital discharge on 8/25 was 196 lbs, elevated to 202 on admission. Trending down to 200 lbs today. I&O's not recorded due to incontinence. Continue with diuresis at current rate for now.   2. Persistent atrial fibrillation with RVR -  on Toprol-XL 75mg  daily PTA. BB dosing has been reduced to 12.5mg  daily secondary to hypotension but HR remains elevated in the 120's at times. Will titrate back to 25mg  daily and follow BP closely.  - not on anticoagulation due to history of GIB and chronic anemia.    3. Acute on Chronic Stage 4 CKD - baseline creatinine 1.5 - 1.6. Elevated to 2.15 on admission, at 2.41 this AM.  - continues to receive IV Lasix 80mg  TID.  - Nephrology following and recommended attempt at more aggressive diuresis. Palliative Care has been consulted and is meeting with the patient's family later today.   4. Elevated troponin with hx of CAD s/p CABG - flat troponin this admission with values at 0.21, 0.20, and 0.21. - likely secondary to demand ischemia in the setting of CKD and elevated HR.  - he denies any recent anginal symptoms. No plans for further ischemic evaluation at this time.   5. Hyponatremia - Na+ 131 this AM. Continue to follow with diuresis.   6. Chronic Anemia - Hgb stable at 9.4. He denies any evidence of active bleeding.   7. Transaminitis - AST at 564, ALT 380. - will stop Lipitor.  - further workup per admitting team.    Signed, Erma Heritage , PA-C 12:57 PM 09/25/2016 Pager: [phone]

## 2016-09-25 NOTE — Progress Notes (Signed)
HR sustaining 140's, has PRN.

## 2016-09-25 NOTE — Consult Note (Signed)
Consultation Note Date: 09/25/2016   Patient Name: Mark Rivers  DOB: [birth year]  MRN: [id number]  Age / Sex: 82 y.o., male  PCP: [name], MD Referring Physician: Modena Jansky, MD  Reason for Consultation: Establishing goals of care and Psychosocial/spiritual support  HPI/Patient Profile: 82 y.o. male  admitted on 09/21/2016 with  Past medical history significant of PAF, CAD, CVA, chronic diastolic CHF last EF 30-86%, chronic respiratory failure on 2- 3 L, DM type II, GI bleed in March 2018,  esophageal adenocarcinoma; who presented to ER with complaints of acute shortness of breath.   Patient at baseline lives alone, but has home health aides that check on him daily.   Patient  was recently hospitalized for acute on chronic congestive heart failure from 8/23-8/25. Since leaving hospital patient notes that he's been complaining of orthopnea with swelling in his legs and belly.  Compliance with medications and diet in question by family and neighbors  Multiple hospitalizations over the past six months.  Neighbor reports continued physical, functional and cognitive decline since his opne heart surgery in January 05, 2016 and death of his wife in 2016/03/07.  Patient and family face advanced directive decisions and anticipatory care needs   Clinical Assessment and Goals of Care:  This NP Wadie Lessen reviewed medical records, received report from team, assessed the patient and then meet at the patient's bedside along with friend and neighbor Danton Clap, and by telephone with his son/Rodney and daughter/Ruth to discuss diagnosis,(specific to his now significant renal disease and his not being a dialysis candidate er nephrology) and  prognosis, [place], EOL wishes disposition and options.  A detailed discussion was had today regarding advanced directives.  Concepts specific to code status, artifical  feeding and hydration, continued IV antibiotics and rehospitalization was had.    The difference between an aggressive medical intervention path  and a palliative comfort care path for this patient at this time was had.    Values and goals of care important to patient and family were attempted to be elicited.  Concept of Hospice and Palliative Care were discussed  Natural trajectory and expectations at EOL were discussed.  Questions and concerns addressed.     Discussed with patient the importance of continued conversation with family and their  medical providers regarding overall plan of care and treatment options,  ensuring decisions are within the context of the patients values and GOCs.  FAmily face advanced directive decisions and anticipatory are needs   Family encouraged to call with questions or concerns.  Scheduled telephone conference for tomorrow at 1200  PMT will continue to support holistically.   HCPOA/Rodney Calabria/son---no documentation noted in [facility] lives in [place] and have not seen the patient in several months. Family is open to all offered and available medical intervettions to prolong life, treat the treatable. Open to SNF for rehabilitation.  Family is coming to realize that the patient may not be able to live alone  and are considering bringing him to [place] to live with son.  They are considering all the logistics of this move.  At this time he is not medically stable for such transitions   Code Status/Advance Care Planning:  DNR   Symptom Management:   Weakness:   SNF for rehab if meets criteria  Palliative Prophylaxis:   Aspiration, Bowel Regimen, Delirium Protocol, Frequent Pain Assessment and Oral Care  Additional Recommendations (Limitations, Scope, Preferences):  - Open to all medical interventions to improve health and return to baseline  Psycho-social/Spiritual:   Desire for further Chaplaincy  support:no  Additional Recommendations: Education on Hospice  Prognosis:   Unable to determine  Discharge Planning: To Be Determined      Primary Diagnoses: Present on Admission: . Acute on chronic respiratory failure with hypoxia ([place]) . Acute on chronic diastolic congestive heart failure ([place]) . Persistent atrial fibrillation ([place]) . Diabetes mellitus with stage 3 chronic kidney disease ([place]) . Anemia of chronic disease   I have reviewed the medical record, interviewed the patient and family, and examined the patient. The following aspects are pertinent.  Past Medical History:  Diagnosis Date  . Allergy   . Back pain   . Chronic diastolic CHF (congestive heart failure) ([place]) 12/21/2015   A. Echo 10/17: Moderate LVH, EF 55-60, normal wall motion, grade 2 diastolic dysfunction, mild aortic stenosis (mean 14, peak 34), MAC, mild LAE    . Chronic respiratory failure ([place])   . CKD (chronic kidney disease) stage 3, GFR 30-59 ml/min 03/04/2014  . Coronary artery disease    a. s/p remote POBA in 1988 // b. NSTEMI in 10/17 >> LHC with 99% LM stenosis >> emergent CABG (L-LAD, S-OM2, S-PDA) - post op course complicated (prolonged ventilation, s/p Trach, AFib, anemia req transfusion, bacteremia >> DC to LTAC)  . Diabetes mellitus   . Difficult intubation    difficult airway/FYI note 11/01/2015  . Esophageal adenocarcinoma ([place])    a. found by EGD 03/2016..  . GERD (gastroesophageal reflux disease)   . GI bleed 01/2016   a. 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 F/u EGD with esophageal adenocarcinoma 03/2016.  [name] History of nuclear stress test    a. Myoview 6/14 - EF 51, no ischemia or scar; Low Risk  . Hyperlipemia   . Hypertension   . Methicillin resistant Staphylococcus epidermidis infection 11/2015   a. during  adm for CABG.  . Neuropathy   . On home oxygen therapy    "2L; 24/7" (02/09/2016)  . Persistent atrial fibrillation ([place]) 10/25/2015  . Sinus bradycardia    a. Prior HR 54 when in sinus rhythm 11/2015.  . Stroke [facility])    a. old L pontine infarcts seen on CT 04/2016.   Social History   Social History  . Marital status: Widowed    Spouse name: N/A  . Number of children: N/A  . Years of education: N/A   Occupational History  . retired    Social History Main Topics  . Smoking status: Former Smoker    Packs/day: 2.00    Years: 30.00    Types: Cigarettes    Quit date: 06/28/2004  . Smokeless tobacco: Never Used  . Alcohol use No     Comment: QUIT 06/28/2004  . Drug use: No  . Sexual activity: Yes   Other Topics Concern  .  None   Social History Narrative  . None   Family History  Problem Relation Age of Onset  . Diabetes Father   . Colon cancer Neg Hx   . Esophageal cancer Neg Hx   . Stomach cancer Neg Hx   . Rectal cancer Neg Hx   . Allergic rhinitis Neg Hx   . Angioedema Neg Hx   . Asthma Neg Hx   . Eczema Neg Hx   . Immunodeficiency Neg Hx   . Urticaria Neg Hx   . Liver disease Neg Hx    Scheduled Meds: . diphenhydrAMINE  50 mg Oral QHS   And  . acetaminophen  1,000 mg Oral QHS  . enoxaparin (LOVENOX) injection  30 mg Subcutaneous Daily  . fenofibrate  54 mg Oral Daily  . furosemide  80 mg Intravenous TID  . insulin aspart  0-9 Units Subcutaneous TID WC  . insulin glargine  30 Units Subcutaneous QHS  . [START ON 09/26/2016] metoprolol succinate  25 mg Oral Daily  . pantoprazole  40 mg Oral Q0600  . sodium chloride flush  3 mL Intravenous Q12H  . traZODone  100 mg Oral QHS  . vitamin C  500 mg Oral Daily   Continuous Infusions: . sodium chloride     PRN Meds:.sodium chloride, acetaminophen, guaiFENesin-dextromethorphan, metoprolol tartrate, ondansetron (ZOFRAN) IV, sodium chloride flush, traMADol Medications Prior to Admission:  Prior to Admission  medications   Medication Sig Start Date End Date Taking? Authorizing Provider  atorvastatin (LIPITOR) 80 MG tablet Take 1 tablet (80 mg total) by mouth daily. 03/31/16  Yes Josue Hector, MD  cholecalciferol (VITAMIN D) 1000 units tablet Take 1,000 Units by mouth daily.   Yes [provider]  diphenhydramine-acetaminophen (TYLENOL PM) 25-500 MG TABS tablet Take 2 tablets by mouth at bedtime.   Yes [provider]  fenofibrate 54 MG tablet Take 54 mg by mouth daily.   Yes [provider]  furosemide (LASIX) 40 MG tablet Take 1.5 tablets (60 mg total) by mouth 2 (two) times daily. 08/24/16 08/24/17 Yes Josue Hector, MD  Glucosamine-Chondroit-Vit C-Mn (GLUCOSAMINE CHONDR 1500 COMPLX PO) Take 2 tablets by mouth daily.    Yes [provider]  Insulin Glargine (TOUJEO SOLOSTAR) 300 UNIT/ML SOPN Inject 30 Units into the skin at bedtime.   Yes [provider]  metoprolol succinate (TOPROL-XL) 50 MG 24 hr tablet Take 1 tablet (50 mg total) by mouth daily. Take with or immediately following a meal. 02/16/16 09/21/16 Yes Weaver, Scott T, PA-C  OXYGEN Inhale 2 L/min into the lungs continuous.   Yes [provider]  pantoprazole (PROTONIX) 40 MG tablet Take 1 tablet (40 mg total) by mouth daily at 6 (six) AM. 02/14/16  Yes Allie Bossier, MD  traMADol (ULTRAM) 50 MG tablet Take by mouth every 6 (six) hours as needed.   Yes [provider]  traZODone (DESYREL) 100 MG tablet Take 100 mg by mouth at bedtime.    Yes [provider]  vitamin C (ASCORBIC ACID) 500 MG tablet Take 500 mg by mouth daily.   Yes [provider]   Allergies  Allergen Reactions  . Amoxicillin Other (See Comments)    PATIENT PASSED OUT   . Cephalexin Other (See Comments)    PATIENT PASSED OUT  . Penicillin G Other (See Comments)    PATIENT PASSED OUT. Has patient had a PCN reaction causing immediate rash, facial/tongue/throat swelling, SOB or  lightheadedness with hypotension: Yes Has  patient had a PCN reaction causing severe rash involving mucus membranes or skin necrosis: No Has patient had a PCN reaction that required hospitalization: Yes Has patient had a PCN reaction occurring within the last 10 years: Yes If all of the above answers are "NO", then may proceed with Cephalosporin   Review of Systems  Unable to perform ROS: Acuity of condition    Physical Exam  Constitutional: He appears well-developed. He appears lethargic. He appears ill. Nasal cannula in place.  Cardiovascular: Normal rate, regular rhythm and normal heart sounds.   Pulmonary/Chest: He has decreased breath sounds in the right lower field and the left lower field.  Neurological: He appears lethargic.  Skin: Skin is warm and dry.    Vital Signs: BP (!) 114/53 (BP Location: Left Arm)   Pulse 88   Temp 98.3 F (36.8 C) (Oral)   Resp 18   Ht 5' 7"  (1.702 m)   Wt 91.1 kg (200 lb 12.8 oz)   SpO2 91%   BMI 31.45 kg/m  Pain Assessment: No/denies pain   Pain Score: 0-No pain   SpO2: SpO2: 91 % O2 Device:SpO2: 91 % O2 Flow Rate: .O2 Flow Rate (L/min): 3 L/min  IO: Intake/output summary:  Intake/Output Summary (Last 24 hours) at 09/25/16 1427 Last data filed at 09/25/16 1427  Gross per 24 hour  Intake              700 ml  Output              325 ml  Net              375 ml    LBM: Last BM Date: 09/24/16 Baseline Weight: Weight: 90.3 kg (199 lb) Most recent weight: Weight: 91.1 kg (200 lb 12.8 oz)     Palliative Assessment/Data: 30 %    Discussed with Dr Algis Liming  Time In: 1400 Time Out: 1530 Time Total: 90  min Greater than 50%  of this time was spent counseling and coordinating care related to the above assessment and plan.  Signed by: Wadie Lessen, NP   Please contact Palliative Medicine Team phone at [phone] for questions and concerns.  For individual provider: See Shea Evans

## 2016-09-25 NOTE — Progress Notes (Signed)
Palliative Medicine consult noted. Due to high referral volume, there may be a delay seeing this patient. Please call the Palliative Medicine Team office at [phone] if recommendations are needed in the interim.  Thank you for inviting [place] to see this patient.  Marjie Skiff Lekita Kerekes, RN, BSN, [facility] 09/25/2016 9:28 AM Cell [phone] 8:00-4:00 Monday-Friday Office [phone]

## 2016-09-26 ENCOUNTER — Inpatient Hospital Stay (HOSPITAL_COMMUNITY): Payer: Medicare Other

## 2016-09-26 ENCOUNTER — Other Ambulatory Visit (HOSPITAL_COMMUNITY): Payer: Medicare Other

## 2016-09-26 ENCOUNTER — Ambulatory Visit: Payer: Medicare Other | Admitting: Nurse Practitioner

## 2016-09-26 DIAGNOSIS — Z66 Do not resuscitate: Secondary | ICD-10-CM

## 2016-09-26 DIAGNOSIS — I4891 Unspecified atrial fibrillation: Secondary | ICD-10-CM

## 2016-09-26 DIAGNOSIS — R404 Transient alteration of awareness: Secondary | ICD-10-CM

## 2016-09-26 DIAGNOSIS — R410 Disorientation, unspecified: Secondary | ICD-10-CM

## 2016-09-26 DIAGNOSIS — Z515 Encounter for palliative care: Secondary | ICD-10-CM

## 2016-09-26 DIAGNOSIS — N179 Acute kidney failure, unspecified: Secondary | ICD-10-CM

## 2016-09-26 LAB — CBC
HEMATOCRIT: 30.8 % — AB (ref 39.0–52.0)
HEMOGLOBIN: 9.4 g/dL — AB (ref 13.0–17.0)
MCH: 25.8 pg — AB (ref 26.0–34.0)
MCHC: 30.5 g/dL (ref 30.0–36.0)
MCV: 84.4 fL (ref 78.0–100.0)
Platelets: 399 10*3/uL (ref 150–400)
RBC: 3.65 MIL/uL — ABNORMAL LOW (ref 4.22–5.81)
RDW: 17.2 % — ABNORMAL HIGH (ref 11.5–15.5)
WBC: 18 10*3/uL — AB (ref 4.0–10.5)

## 2016-09-26 LAB — COMPREHENSIVE METABOLIC PANEL
ALT: 386 U/L — ABNORMAL HIGH (ref 17–63)
ANION GAP: 12 (ref 5–15)
AST: 579 U/L — ABNORMAL HIGH (ref 15–41)
Albumin: 2.6 g/dL — ABNORMAL LOW (ref 3.5–5.0)
Alkaline Phosphatase: 135 U/L — ABNORMAL HIGH (ref 38–126)
BILIRUBIN TOTAL: 1.7 mg/dL — AB (ref 0.3–1.2)
BUN: 90 mg/dL — ABNORMAL HIGH (ref 6–20)
CALCIUM: 8.3 mg/dL — AB (ref 8.9–10.3)
CO2: 26 mmol/L (ref 22–32)
Chloride: 92 mmol/L — ABNORMAL LOW (ref 101–111)
Creatinine, Ser: 2.06 mg/dL — ABNORMAL HIGH (ref 0.61–1.24)
GFR calc Af Amer: 33 mL/min — ABNORMAL LOW (ref >60)
GFR, EST NON AFRICAN AMERICAN: 29 mL/min — AB (ref >60)
Glucose, Bld: 111 mg/dL — ABNORMAL HIGH (ref 65–99)
POTASSIUM: 4.5 mmol/L (ref 3.5–5.1)
Sodium: 130 mmol/L — ABNORMAL LOW (ref 135–145)
TOTAL PROTEIN: 6.5 g/dL (ref 6.5–8.1)

## 2016-09-26 LAB — SEDIMENTATION RATE: SED RATE: 29 mm/h — AB (ref 0–16)

## 2016-09-26 LAB — GLUCOSE, CAPILLARY
GLUCOSE-CAPILLARY: 104 mg/dL — AB (ref 65–99)
GLUCOSE-CAPILLARY: 125 mg/dL — AB (ref 65–99)
Glucose-Capillary: 109 mg/dL — ABNORMAL HIGH (ref 65–99)
Glucose-Capillary: 124 mg/dL — ABNORMAL HIGH (ref 65–99)

## 2016-09-26 MED ORDER — LORAZEPAM 2 MG/ML IJ SOLN
1.0000 mg | Freq: Once | INTRAMUSCULAR | Status: AC
  Administered 2016-09-26: 1 mg via INTRAVENOUS
  Filled 2016-09-26: qty 1

## 2016-09-26 MED ORDER — ACETAMINOPHEN 500 MG PO TABS
500.0000 mg | ORAL_TABLET | Freq: Four times a day (QID) | ORAL | Status: DC | PRN
  Administered 2016-09-29 – 2016-10-04 (×5): 500 mg via ORAL
  Filled 2016-09-26 (×5): qty 1

## 2016-09-26 MED ORDER — METOPROLOL SUCCINATE ER 25 MG PO TB24
25.0000 mg | ORAL_TABLET | Freq: Two times a day (BID) | ORAL | Status: DC
  Administered 2016-09-26 – 2016-09-27 (×2): 25 mg via ORAL
  Filled 2016-09-26 (×2): qty 1

## 2016-09-26 NOTE — Progress Notes (Signed)
Progress Note  Patient Name: Mark Rivers Date of Encounter: 09/26/2016  Primary Cardiologist: Dr. Johnsie Cancel  Subjective   Yelling out about burning bottom.  Does not wish to turn.  Inpatient Medications    Scheduled Meds: . diphenhydrAMINE  50 mg Oral QHS   And  . acetaminophen  1,000 mg Oral QHS  . enoxaparin (LOVENOX) injection  30 mg Subcutaneous Daily  . fenofibrate  54 mg Oral Daily  . furosemide  80 mg Intravenous TID  . insulin aspart  0-9 Units Subcutaneous TID WC  . insulin glargine  30 Units Subcutaneous QHS  . metoprolol succinate  25 mg Oral Daily  . pantoprazole  40 mg Oral Q0600  . sodium chloride flush  3 mL Intravenous Q12H  . traZODone  100 mg Oral QHS  . vitamin C  500 mg Oral Daily   Continuous Infusions: . sodium chloride     PRN Meds: sodium chloride, acetaminophen, guaiFENesin-dextromethorphan, metoprolol tartrate, ondansetron (ZOFRAN) IV, sodium chloride flush, traMADol   Vital Signs    Vitals:   09/25/16 0842 09/25/16 1221 09/25/16 1942 09/26/16 0416  BP: 120/80 (!) 114/53 124/90 129/84  Pulse: 100 88 (!) 101 (!) 102  Resp:  18 18 18   Temp:  98.3 F (36.8 C) 97.6 F (36.4 C) (!) 97.4 F (36.3 C)  TempSrc:  Oral Oral Oral  SpO2:  91% 92% 92%  Weight:    202 lb 6.4 oz (91.8 kg)  Height:        Intake/Output Summary (Last 24 hours) at 09/26/16 0920 Last data filed at 09/26/16 2130  Gross per 24 hour  Intake              700 ml  Output              650 ml  Net               50 ml   Filed Weights   09/24/16 1300 09/25/16 0347 09/26/16 0416  Weight: 202 lb 12.8 oz (92 kg) 200 lb 12.8 oz (91.1 kg) 202 lb 6.4 oz (91.8 kg)    Telemetry    A fib with rate up into 140s, some WCT  - Personally Reviewed  ECG    No new - Personally Reviewed  Physical Exam   GEN: No acute distress with respirations or chest pain, + burning of sacrum does not wish to turn, did not want wound care.  Neck: No JVD Cardiac: irreg irreg, no murmurs,  rubs, or gallops.  Respiratory: Clear to auscultation bilaterally. Though diminished in bases GI: Soft, nontender, non-distended  MS: No edema; No deformity. Neuro:  Nonfocal  Psych: Normal affect   Labs    Chemistry Recent Labs Lab 09/24/16 0426 09/25/16 0448 09/26/16 0527  NA 128* 131* 130*  K 4.6 4.7 4.5  CL 90* 93* 92*  CO2 28 27 26   GLUCOSE 151* 142* 111*  BUN 82* 89* 90*  CREATININE 2.45* 2.41* 2.06*  CALCIUM 8.1* 8.3* 8.3*  PROT  --  6.1* 6.5  ALBUMIN  --  2.7* 2.6*  AST  --  564* 579*  ALT  --  380* 386*  ALKPHOS  --  125 135*  BILITOT  --  1.4* 1.7*  GFRNONAA 23* 24* 29*  GFRAA 27* 28* 33*  ANIONGAP 10 11 12      Hematology Recent Labs Lab 09/23/16 0604 09/25/16 0448 09/26/16 0527  WBC 17.6* 18.1* 18.0*  RBC 3.49* 3.61* 3.65*  HGB  9.2* 9.4* 9.4*  HCT 30.2* 30.8* 30.8*  MCV 86.5 85.3 84.4  MCH 26.4 26.0 25.8*  MCHC 30.5 30.5 30.5  RDW 17.4* 17.3* 17.2*  PLT 380 368 399    Cardiac Enzymes Recent Labs Lab 09/22/16 0241 09/22/16 0728 09/22/16 1406  TROPONINI 0.21* 0.20* 0.21*    Recent Labs Lab 09/21/16 1837 09/21/16 2200  TROPIPOC 0.08 0.05     BNP Recent Labs Lab 09/21/16 1823  BNP 1,875.7*     DDimer No results for input(s): DDIMER in the last 168 hours.   Radiology    Dg Chest Port 1 View  Result Date: 09/24/2016 CLINICAL DATA:  Hypoxia. EXAM: PORTABLE CHEST 1 VIEW COMPARISON:  09/21/2016 FINDINGS: Prior median sternotomy. Midline trachea. Cardiomegaly accentuated by AP portable technique. Developing small left pleural effusion. Probable layering right pleural effusion, small. No pneumothorax. Low lung volumes. Interstitial edema is greater on the right and slightly increased. Left base volume loss with developing airspace disease in both lung bases. IMPRESSION: Slightly worsened aeration, with increased asymmetric interstitial edema. Developing left and likely right pleural effusions with bibasilar airspace disease, favoring  atelectasis. Electronically Signed   By: Abigail Miyamoto M.D.   On: 09/24/2016 15:54    Cardiac Studies   Echocardiogram: 09/2016 Study Conclusions  - Left ventricle: The cavity size was normal. Wall thickness was increased in a pattern of mild LVH. Systolic function was moderately reduced. The estimated ejection fraction was in the range of 35% to 40%. Diffuse hypokinesis. The study is not technically sufficient to allow evaluation of LV diastolic function. - Aortic valve: Moderately to severely calcified annulus. Mildly thickened, mildly calcified leaflets. Morphologically, it appears there is at least mild if not mild to moderate aortic valvular stenosis. - Mitral valve: Calcified annulus. There was moderate to severe regurgitation. - Left atrium: The atrium was severely dilated. - Right ventricle: The cavity size was moderately dilated. Systolic function was severely reduced. - Right atrium: The atrium was moderately dilated. - Atrial septum: No defect or patent foramen ovale was identified. - Tricuspid valve: There was moderate regurgitation.   Patient Profile     82 y.o. male  w/ PMH of CAD (s/p CABG in 10/2015), persistent atrial fibrillation (not on anticoagulation due to history of GIB), chronic combined systolic and diastolic CHF, esophageal adenocarcinoma, HTN, HLD, Type 2 DM, and COPD who presented to [facility] ED on 09/21/2016 for worsening orthopnea and edema, found to be in atrial fibrillation with RVR.  Assessment & Plan    1. Acute on Chronic Combined Systolic and diastolic CHF - echo this admission shows a reduced EF of 35-40% with diffuse HK, EF previously 55-60% by echo in 04/2016. - BNP elevated to 1875 on admission. Weight at time of hospital discharge on 8/25 was 196 lbs, elevated to 202 on admission. I&O's not recorded due to incontinence. Continue with diuresis at current rate for now.  --wt 202 today back to peak  --lasix 80 mg TID ?  Add metolazone  --Cr 2.06 today; down from 2.41; 2.45    2. Persistent atrial fibrillation with RVR - on Toprol-XL 75mg  daily PTA. BB dosing has been reduced to 12.5mg  daily secondary to hypotension but HR remains elevated in the 120's at times. Will titrate back to 25 BID mg daily and follow BP closely. Today 124/90 He has prn order for lopressor IV as well rec'd one dose this AM - not on anticoagulation due to history of GIB and chronic anemia.  3. Acute on Chronic Stage 4 CKD - baseline creatinine 1.5 - 1.6. Elevated to 2.15 on admission, at 2.41 now down to 2.06   - continues to receive IV Lasix 80mg  TID.  - Nephrology following and recommended attempt at more aggressive diuresis. Palliative Care has been consulted and is meeting with the patient's family later today.   4. Elevated troponin with hx of CAD s/p CABG - flat troponin this admission with values at 0.21, 0.20, and 0.21. - likely secondary to demand ischemia in the setting of CKD and elevated HR.  - he denies any recent anginal symptoms. No plans for further ischemic evaluation at this time.   5. Hyponatremia - Na+ 130 this AM. Continue to follow with diuresis.   6. Chronic Anemia - Hgb stable at 9.4. He denies any evidence of active bleeding.   7. Transaminitis - AST at 579, ALT 386. - will stop Lipitor.  - further workup per admitting team.    For questions or updates, please contact [place] Please consult [email or web address] for contact info under Cardiology/STEMI.      Signed, Cecilie Kicks, NP  09/26/2016, 9:20 AM

## 2016-09-26 NOTE — Progress Notes (Signed)
Patient had 14 beats of vtach per CCMD.  Patient in bed sleeping. Responds to verbal stimuli.  No obvious distress.  Dr Algis Liming notified.  Will monitor patient. Marcille Blanco, RN

## 2016-09-26 NOTE — Care Management Important Message (Signed)
Important Message  Patient Details  Name: Mark Rivers MRN: [id number] Date of Birth: [birth year]   Medicare Important Message Given:  Yes    Mark Rivers 09/26/2016, 11:01 AM

## 2016-09-26 NOTE — Progress Notes (Signed)
Transport here for ultrasound.

## 2016-09-26 NOTE — Progress Notes (Addendum)
PROGRESS NOTE  Mark Rivers NID:[id number] DOB: 1934/01/12 DOA: 09/21/2016 PCP: [name], MD  Brief narrative: 82 year old male with PMH of persistent A. fib poor candidate for anticoagulation due to bleeding risks, CAD, CVA, chronic diastolic CHF with last EF 55-60 percent, chronic respiratory failure on home oxygen 2-3 L/m, type II DM, HTN, HLD GI bleed in March 2018, esophageal adenocarcinoma, stage III chronic kidney disease, recent hospitalization 8/23-8/25 for decompensated CHF, presented to ED on 09/22/16 with worsening dyspnea, orthopnea and leg edema. He was admitted for acute on chronic diastolic CHF. Cardiology, nephrology and palliative care consulted.  Assessment/Plan: Principal Problem:   Acute on chronic respiratory failure with hypoxia (HCC) Active Problems:   Diabetes mellitus with stage 3 chronic kidney disease (HCC)   Persistent atrial fibrillation (HCC)   Acute on chronic diastolic congestive heart failure (HCC)   Anemia of chronic disease   Palliative care by specialist   DNR (do not resuscitate)   Acute renal failure ([place])  1. Acute on chronic combined systolic and diastolic CHF: Cardiology follow-up appreciated. They indicated that his physiology is behaving with right heart failure and left heart hypoperfusion, PE in the context of cancer would be of concern but patient not candidate for anticoagulation. They recommended decreasing diuretics as increasing heart rate suggested LV volume underfilling. Despite IV Lasix, has not diuresed well. As per nephrology, no good answer in this case, could try more aggressive diuretics but run the risk of worsening renal function. Palliative care discussing with family regarding goals of care. Echo this admission showed EF 35-40 percent with diffuse hypokinesis, reduced from 55-60 percent in April 2018. Currently on IV Lasix 80 mg 3 times a day. -300 mL since admission & weight up from 199 pounds on admission to 202, not sure  if either are accurate. Creatinine has reduced to 2.06. Cardiology following and are considering adding metolazone. 2. Acute on chronic respiratory failure with hypoxia: Secondary to decompensated CHF. Management as above. Currently saturating in the low 90s on 3 L/m. follow-up chest x-ray. 3. Persistent A. fib with RVR: Patient was on Toprol-XL 75 MG daily PTA. This had been reduced to 12.5 MG daily due to hypotension and then increased to 25 MG twice daily on 9/18 d/t periods of RVR in the 110s-120s and occasionally in the 140s. Not candidate for anticoagulation due to history of GI bleed.  4. Acute on stage IV chronic kidney disease: Baseline creatinine appears to be close to 1.5. Nephrology input 9/16 appreciated. Not a candidate for dialysis. Palliative care input requested. Renal ultrasound without obstruction. Creatinine has reduced from 2.4-2.06. Follow BMP. 5. CAD status post CABG/elevated troponin: No chest pain. Flat trend. Likely demand ischemia related to decompensated CHF, A. fib with RVR and acute on chronic hypoxic respiratory failure. 6. DM type II with renal complications: Good inpatient control on current dose of Lantus and SSI. Continue. A1c 7.2. 7. Anemia of chronic disease: Stable. 8. Esophageal adenocarcinoma: Patient had EGD 03/09/16 by Dr. [name] which showed adenocarcinoma. He was referred to [facility] for further intervention/treatment. Patient subsequently had EGD on 07/27/16 with Dr. Mortimer Fries and he had area ablated with spray cryotherapy using liquid nitrogen. Outpatient follow-up. 9. Dyslipidemia: 10. Hyponatremia: Stable. Likely hypervolemic hyponatremia. 11. GERD: 12. Leukocytosis: Unclear etiology. No overt clinical source of infection.? Stress response. Has been stable for the last couple of days. 13. Right shoulder pain/generalized pains: Ongoing for about 3 weeks and reports history of steroid injection. X-ray showed degenerative changes  without acute findings.  Patient reports pain in most of his extremity joints but detailed exam does not show any features for acute arthritis. Check ESR. Check blood culture. No NSAIDs due to acute kidney injury and esophageal cancer/GI bleed issues. Minimize acetaminophen due to abnormal LFTs. 14. Elevated uric acid: Do not believe his right shoulder pain is due to acute gout. May consider allopurinol if renal functions improved. 15. Adult failure to thrive: Multifactorial due to multiple severe significant comorbidities, advanced age and fragility. Poor overall prognosis. Palliative care input appreciated and they plan to follow-up with family today. Discussed with palliative care team on 9/17. 16. Transaminitis:? Passive congestion due to CHF versus other etiologies. Lipitor stopped. No significant change. Continue to follow LFTs. Check RUQ ultrasound. 17. Confusion/delirium: Likely multifactorial due to acute illness, acute kidney injury, pain complicating possible underlying dementia. Treat underlying cause, attempt adequate sleep hygiene, minimize opioids. Ammonia normal. DC Benadryl bedtime. Did receive a dose of Ativan last night which may have worsened situation. Consider when necessary Haldol if needed.   DVT prophylaxis: Lovenox Code Status: DNR Family Communication: Discussed in detail with his son via phone, updated care and answered questions. Advised him off patient's critical condition and encouraged family to visit him if possible. Disposition Plan: not ready for discharge   Consultants:  Cardiology  Nephrology  Palliative care  Procedures:  none  Antibiotics:  None   Subjective:  Was confused. Intermittently yelling out even though did not appear in pain. Other times would complain of generalized pains.     Objective:  Vitals:   09/25/16 1221 09/25/16 1942 09/26/16 0416 09/26/16 1228  BP: (!) 114/53 124/90 129/84 138/86  Pulse: 88 (!) 101 (!) 102 (!) 120  Resp: '18 18 18 18  ' Temp:  98.3 F (36.8 C) 97.6 F (36.4 C) (!) 97.4 F (36.3 C) 98 F (36.7 C)  TempSrc: Oral Oral Oral Oral  SpO2: 91% 92% 92% 94%  Weight:   91.8 kg (202 lb 6.4 oz)   Height:        : Intake/Output Summary (Last 24 hours) at 09/26/16 1414 Last data filed at 09/26/16 1050  Gross per 24 hour  Intake              820 ml  Output             1070 ml  Net             -250 ml   Filed Weights   09/24/16 1300 09/25/16 0347 09/26/16 0416  Weight: 92 kg (202 lb 12.8 oz) 91.1 kg (200 lb 12.8 oz) 91.8 kg (202 lb 6.4 oz)    Exam:    General:  Elderly male, moderately built and nourished, chronically ill-looking, lying comfortably propped up in bed. Intermittently yells out for pain at different places but does not appear in painful distress.  Cardiovascular: S1 and S2 heard, irregularly irregular. No JVD or murmurs. Trace bilateral ankle edema. Telemetry: A. fib with ventricular rate mostly in the 110s-120s.  Respiratory: Diminished breath sounds in the bases with occasional bibasal crackles. Rest of lung fields clear to auscultation. No increased work of breathing.  Abdomen: Soft/ND/NT, positive BS. Stable  Musculoskeletal: Trace bilateral ankle edema. No acute findings in large or small joints of his extremities. Moves all extremities symmetrically and well.  Neuro: alert and oriented to person only. No focal neurological deficits.   Psychiatry: Insight impaired. Mood flat.  Data Reviewed: Basic Metabolic Panel:  Recent Labs  Lab 09/21/16 1823 09/22/16 0728 09/23/16 0604 09/24/16 0426 09/25/16 0448 09/26/16 0527  NA 133* 131* 130* 128* 131* 130*  K 4.3 4.0 4.3 4.6 4.7 4.5  CL 96* 94* 93* 90* 93* 92*  CO2 '27 26 23 28 27 26  ' GLUCOSE 115* 111* 126* 151* 142* 111*  BUN 43* 54* 70* 82* 89* 90*  CREATININE 1.83* 2.15* 2.63* 2.45* 2.41* 2.06*  CALCIUM 8.4* 8.1* 8.3* 8.1* 8.3* 8.3*  MG 1.9  --  2.3  --   --   --    Liver Function Tests:  Recent Labs Lab 09/25/16 0448  09/26/16 0527  AST 564* 579*  ALT 380* 386*  ALKPHOS 125 135*  BILITOT 1.4* 1.7*  PROT 6.1* 6.5  ALBUMIN 2.7* 2.6*   No results for input(s): LIPASE, AMYLASE in the last 168 hours.  Recent Labs Lab 09/25/16 0448  AMMONIA 31   CBC:  Recent Labs Lab 09/21/16 1823 09/22/16 0728 09/23/16 0604 09/25/16 0448 09/26/16 0527  WBC 17.9* 21.6* 17.6* 18.1* 18.0*  NEUTROABS  --  18.6*  --   --   --   HGB 8.8* 8.7* 9.2* 9.4* 9.4*  HCT 29.4* 28.6* 30.2* 30.8* 30.8*  MCV 87.5 86.4 86.5 85.3 84.4  PLT 402* 357 380 368 399   Cardiac Enzymes:    Recent Labs Lab 09/22/16 0241 09/22/16 0728 09/22/16 1406  TROPONINI 0.21* 0.20* 0.21*   BNP (last 3 results)  Recent Labs  08/18/16 1058 08/31/16 1314 09/21/16 1823  BNP 1,191.6* 2,085.6* 1,875.7*    ProBNP (last 3 results) No results for input(s): PROBNP in the last 8760 hours.  CBG:  Recent Labs Lab 09/25/16 1144 09/25/16 1621 09/25/16 2028 09/26/16 0742 09/26/16 1116  GLUCAP 139* 118* 136* 104* 109*    No results found for this or any previous visit (from the past 240 hour(s)).   Studies: No results found.  Scheduled Meds: . diphenhydrAMINE  50 mg Oral QHS   And  . acetaminophen  1,000 mg Oral QHS  . enoxaparin (LOVENOX) injection  30 mg Subcutaneous Daily  . fenofibrate  54 mg Oral Daily  . furosemide  80 mg Intravenous TID  . insulin aspart  0-9 Units Subcutaneous TID WC  . insulin glargine  30 Units Subcutaneous QHS  . metoprolol succinate  25 mg Oral BID  . pantoprazole  40 mg Oral Q0600  . sodium chloride flush  3 mL Intravenous Q12H  . traZODone  100 mg Oral QHS  . vitamin C  500 mg Oral Daily    Continuous Infusions: . sodium chloride       Time spent: 35 mins   Maryiah Olvey, MD, FACP, FHM. [facility] Pager [phone]  If 7PM-7AM, please contact night-coverage [email or web address] Password [facility] 09/26/2016, 2:14 PM

## 2016-09-26 NOTE — Progress Notes (Signed)
Patient ID: Jestin Burbach, male   DOB: Oct 26, 1934, 82 y.o.   MRN: [id number]  This NP visited patient at the bedside and made f/u phone call to son and two daughters to continue discussion regarding diagnosis , prognosis, GOCs, EOL wishes disposition and options.  Discussed with family concerns that patient is not improving medically in spite of medical interventions.  He continues to be intermittently confused and agitated.  Discussed multi-factorial facets specific to acute illness, age, hospitalization, chronic pain, and possible underlying dementia.  We discussed concept of failure to thrive secondary to multiple comorbidities. Patient has had 3 hospitalizations in the last 6 months.  Family express limited insight into the complexity and seriousness of the patient's current medical and psychosocial situation.  Continued education and emotional support offered.   Encouraged family if any way possible the importance of being present in [place] with this patient to advocate and direct care plan.  Discussed with family the importance of continued conversation with patient and each other and their  medical providers regarding overall plan of care and treatment options,  ensuring decisions are within the context of the patients values and GOCs.  Questions and concerns addressed  Discussed with Dr Algis Liming  Time in  1200         Time out    1235        Total time spent on the unit 35 min  Greater than 50% of the time was spent in counseling and coordination of care  Wadie Lessen NP  Palliative Medicine Team Team Phone # [phone] Pager [phone]  Palliative medicine will continue to support holistically

## 2016-09-26 NOTE — Progress Notes (Signed)
Patient constantly yelling out despite being able to speak and verbalize his needs. Leadership went to round on patient and patient complains of pain to his buttocks. Multiple attempts made to either get patient out of bed to chair or the reposition to his left or right side. Patient refuses despite explaining the benefits of repositioning. He request demerol. Patient has been educated that pain medication will not provide any relief and that he needs to get out of bed. Patient refuses and primary nurse has been made aware.

## 2016-09-26 NOTE — Progress Notes (Signed)
[facility]  Patient Status: Active (receiving services up to time of hospitalization)  [facility] is providing the following services: RN, PT and OT  If patient discharges after hours, please call [phone].   Mark Rivers 09/26/2016, 2:53 PM

## 2016-09-26 NOTE — Progress Notes (Signed)
Physical Therapy Treatment Patient Details Name: Mark Rivers MRN: [id number] DOB: Mar 14, 1934 Today's Date: 09/26/2016    History of Present Illness Mark Rivers is a 82 y.o. male with a hx of CAD s/p CABG 10/2015, persistent AFib not on anticoagulation due to prior GI bleed, esophageal adenocarcinoma, DM2, CKD, HTN, HL, COPD, and chronic diastolic CHF, Afib RVR and CHF, FTT with multiple hospitalizations    PT Comments    Patient required mod A +2 for sit to stand and stand pivot transfers as well as for bed mobility. Continue to progress as tolerated with anticipated d/c to SNF for further skilled PT services.    Follow Up Recommendations  SNF;Other (comment)     Equipment Recommendations  None recommended by PT    Recommendations for Other Services OT consult (placed order per protocol)     Precautions / Restrictions Precautions Precautions: Fall    Mobility  Bed Mobility Overal bed mobility: Needs Assistance Bed Mobility: Sit to Supine       Sit to supine: Mod assist;+2 for safety/equipment   General bed mobility comments: assist to lower trunk and to bring bilat LE into bed; assist to then position in bed with pt assisting using bed rails; pt able to bridge X3 with cues for hand/foot placement and technique  Transfers Overall transfer level: Needs assistance Equipment used: Rolling walker (2 wheeled) Transfers: Sit to/from [facility] Sit to Stand: Mod assist;+2 physical assistance Stand pivot transfers: Mod assist;+2 physical assistance       General transfer comment: +2 to power up into standing with bilat feet blocked and cues for hand placement and multimodal cues for posture upon standing; pt stood X2 from recliner; first trial pt sat unexpectedly and when pivoting pt began siting prematurely and required assist to guide hips to EOB   Ambulation/Gait                 Stairs            Wheelchair Mobility    Modified  Rankin (Stroke Patients Only)       Balance Overall balance assessment: Needs assistance;History of Falls Sitting-balance support: Bilateral upper extremity supported;Feet supported Sitting balance-Leahy Scale: Fair     Standing balance support: Bilateral upper extremity supported Standing balance-Leahy Scale: Poor                              Cognition Arousal/Alertness: Awake/alert Behavior During Therapy: WFL for tasks assessed/performed Overall Cognitive Status: No family/caregiver present to determine baseline cognitive functioning Area of Impairment: Orientation;Memory;Following commands;Safety/judgement;Problem solving                 Orientation Level: Disoriented to;Time   Memory: Decreased short-term memory Following Commands: Follows one step commands consistently;Follows one step commands with increased time Safety/Judgement: Decreased awareness of safety;Decreased awareness of deficits   Problem Solving: Slow processing;Decreased initiation;Requires verbal cues General Comments: pt answered questions "yes" then "no" right after throughout session      Exercises      General Comments        Pertinent Vitals/Pain Pain Assessment: Faces Faces Pain Scale: Hurts little more Pain Location: unspecified--with movement Pain Descriptors / Indicators: Grimacing;Guarding;Moaning;Sore Pain Intervention(s): Limited activity within patient's tolerance;Monitored during session;Repositioned    Home Living                      Prior Function  PT Goals (current goals can now be found in the care plan section) Acute Rehab PT Goals PT Goal Formulation: Patient unable to participate in goal setting Time For Goal Achievement: 10/08/16 Potential to Achieve Goals: Fair Progress towards PT goals: Progressing toward goals    Frequency    Min 3X/week      PT Plan Current plan remains appropriate    Co-evaluation               AM-PAC PT "6 Clicks" Daily Activity  Outcome Measure  Difficulty turning over in bed (including adjusting bedclothes, sheets and blankets)?: A Lot Difficulty moving from lying on back to sitting on the side of the bed? : A Lot Difficulty sitting down on and standing up from a chair with arms (e.g., wheelchair, bedside commode, etc,.)?: Unable Help needed moving to and from a bed to chair (including a wheelchair)?: A Little Help needed walking in hospital room?: A Lot Help needed climbing 3-5 steps with a railing? : Total 6 Click Score: 11    End of Session Equipment Utilized During Treatment: Gait belt;Oxygen Activity Tolerance: Patient limited by fatigue Patient left: with call bell/phone within reach;in bed;with bed alarm set Nurse Communication: Mobility status PT Visit Diagnosis: Unsteadiness on feet (R26.81);Muscle weakness (generalized) (M62.81)     Time: 0177-9390 PT Time Calculation (min) (ACUTE ONLY): 17 min  Charges:  $Therapeutic Activity: 8-22 mins                    G Codes:       Earney Navy, PTA Pager: [phone]     Darliss Cheney 09/26/2016, 3:36 PM

## 2016-09-26 NOTE — Progress Notes (Deleted)
CARDIOLOGY OFFICE NOTE  Date:  09/26/2016    Mark Rivers Date of Birth: Feb 29, 1934 Medical Record #[id number]  PCP:  [name], MD  Cardiologist:  Servando Snare & ***    No chief complaint on file.   History of Present Illness: Mark Rivers is a 82 y.o. male who presents today for a ***   Comes in today. Here with   Past Medical History:  Diagnosis Date  . Allergy   . Back pain   . Chronic diastolic CHF (congestive heart failure) ([place]) 12/21/2015   A. Echo 10/17: Moderate LVH, EF 55-60, normal wall motion, grade 2 diastolic dysfunction, mild aortic stenosis (mean 14, peak 34), MAC, mild LAE    . Chronic respiratory failure ([place])   . CKD (chronic kidney disease) stage 3, GFR 30-59 ml/min 03/04/2014  . Coronary artery disease    a. s/p remote POBA in 1988 // b. NSTEMI in 10/17 >> LHC with 99% LM stenosis >> emergent CABG (L-LAD, S-OM2, S-PDA) - post op course complicated (prolonged ventilation, s/p Trach, AFib, anemia req transfusion, bacteremia >> DC to LTAC)  . Diabetes mellitus   . Difficult intubation    difficult airway/FYI note 11/01/2015  . Esophageal adenocarcinoma ([place])    a. found by EGD 03/2016..  . GERD (gastroesophageal reflux disease)   . GI bleed 01/2016   a. 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 F/u EGD with esophageal adenocarcinoma 03/2016.  [name] History of nuclear stress test    a. Myoview 6/14 - EF 51, no ischemia or scar; Low Risk  . Hyperlipemia   . Hypertension   . Methicillin resistant Staphylococcus epidermidis infection 11/2015   a. during adm for CABG.  . Neuropathy   . On home oxygen therapy    "2L; 24/7" (02/09/2016)  . Persistent atrial fibrillation ([place]) 10/25/2015  . Sinus bradycardia    a. Prior HR 54 when in sinus rhythm 11/2015.  . Stroke [facility])    a.  old L pontine infarcts seen on CT 04/2016.    Past Surgical History:  Procedure Laterality Date  . CARDIAC CATHETERIZATION N/A 10/26/2015   Procedure: Left Heart Cath and Coronary Angiography;  Surgeon: Troy Sine, MD;  Location: [place] [facility];  Service: Cardiovascular;  Laterality: N/A;  . CATARACT EXTRACTION W/ INTRAOCULAR LENS  IMPLANT, BILATERAL  2008  . COLONOSCOPY    . CORONARY ANGIOPLASTY WITH STENT PLACEMENT  1988  . CORONARY ARTERY BYPASS GRAFT N/A 10/26/2015   Procedure: CORONARY ARTERY BYPASS GRAFTING (CABG) x3(LIMA to LA, SVG to OM1, SVG to PDA) with EVH from the left thigh and partial lower leg greater saphenous vein and left internal mammary artery;  Surgeon: Ivin Poot, MD;  Location: [place];  Service: Open Heart Surgery;  Laterality: N/A;  . ESOPHAGOGASTRODUODENOSCOPY (EGD) WITH PROPOFOL N/A 02/11/2016   Procedure: ESOPHAGOGASTRODUODENOSCOPY (EGD) WITH PROPOFOL;  Surgeon: Ladene Artist, MD;  Location: [facility] ENDOSCOPY;  Service: Endoscopy;  Laterality: N/A;  . EUS N/A 03/09/2016   Procedure: UPPER ENDOSCOPIC ULTRASOUND (EUS) RADIAL;  Surgeon: Milus Banister, MD;  Location: [facility] ENDOSCOPY;  Service: Endoscopy;  Laterality: N/A;  . TEE WITHOUT CARDIOVERSION N/A 10/26/2015   Procedure: TRANSESOPHAGEAL ECHOCARDIOGRAM (TEE);  Surgeon: Ivin Poot, MD;  Location: [place];  Service: Open Heart Surgery;  Laterality: N/A;  . TONSILLECTOMY  Medications: No outpatient prescriptions have been marked as taking for the 09/26/16 encounter (Appointment) with Burtis Junes, NP.     Allergies: Allergies  Allergen Reactions  . Amoxicillin Other (See Comments)    PATIENT PASSED OUT   . Cephalexin Other (See Comments)    PATIENT PASSED OUT  . Penicillin G Other (See Comments)    PATIENT PASSED OUT. Has patient had a PCN reaction causing immediate rash, facial/tongue/throat swelling, SOB or lightheadedness with hypotension: Yes Has patient had a PCN reaction causing  severe rash involving mucus membranes or skin necrosis: No Has patient had a PCN reaction that required hospitalization: Yes Has patient had a PCN reaction occurring within the last 10 years: Yes If all of the above answers are "NO", then may proceed with Cephalosporin    Social History: The patient  reports that he quit smoking about 12 years ago. His smoking use included Cigarettes. He has a 60.00 pack-year smoking history. He has never used smokeless tobacco. He reports that he does not drink alcohol or use drugs.   Family History: The patient's ***family history includes Diabetes in his father.   Review of Systems: Please see the history of present illness.   Otherwise, the review of systems is positive for {NONE DEFAULTED:18576::"none"}.   All other systems are reviewed and negative.   Physical Exam: VS:  There were no vitals taken for this visit. [name]  BMI There is no height or weight on file to calculate BMI.  Wt Readings from Last 3 Encounters:  09/26/16 202 lb 6.4 oz (91.8 kg)  09/02/16 196 lb 12.8 oz (89.3 kg)  08/24/16 200 lb (90.7 kg)    General: Pleasant. Well developed, well nourished and in no acute distress.   HEENT: Normal.  Neck: Supple, no JVD, carotid bruits, or masses noted.  Cardiac: ***Regular rate and rhythm. No murmurs, rubs, or gallops. No edema.  Respiratory:  Lungs are clear to auscultation bilaterally with normal work of breathing.  GI: Soft and nontender.  MS: No deformity or atrophy. Gait and ROM intact.  Skin: Warm and dry. Color is normal.  Neuro:  Strength and sensation are intact and no gross focal deficits noted.  Psych: Alert, appropriate and with normal affect.   LABORATORY DATA:  EKG:  EKG {ACTION; IS/IS BBC:[id number]} ordered today. This demonstrates ***.  Lab Results  Component Value Date   WBC 18.0 (H) 09/26/2016   HGB 9.4 (L) 09/26/2016   HCT 30.8 (L) 09/26/2016   PLT 399 09/26/2016   GLUCOSE 111 (H) 09/26/2016   TRIG 268 (H)  11/05/2015   ALT 386 (H) 09/26/2016   AST 579 (H) 09/26/2016   NA 130 (L) 09/26/2016   K 4.5 09/26/2016   CL 92 (L) 09/26/2016   CREATININE 2.06 (H) 09/26/2016   BUN 90 (H) 09/26/2016   CO2 26 09/26/2016   TSH 2.639 11/27/2015   INR 1.32 02/11/2016   HGBA1C 7.2 (H) 09/24/2016     BNP (last 3 results)  Recent Labs  08/18/16 1058 08/31/16 1314 09/21/16 1823  BNP 1,191.6* 2,085.6* 1,875.7*    ProBNP (last 3 results) No results for input(s): PROBNP in the last 8760 hours.   Other Studies Reviewed Today:   Assessment/Plan:   Current medicines are reviewed with the patient today.  The patient does not have concerns regarding medicines other than what has been noted above.  The following changes have been made:  See above.  Labs/ tests ordered today include:   No  orders of the defined types were placed in this encounter.    Disposition:   FU with *** in {gen number 1-94:174081} {Days to years:10300}.   Patient is agreeable to this plan and will call if any problems develop in the interim.   SignedTruitt Merle, NP  09/26/2016 7:58 AM  [place] [address] [place], [place]  [address] Phone: [phone] Fax: [phone]

## 2016-09-26 NOTE — Progress Notes (Signed)
Patient complaining of right wrist hurting, PRN pain medication is not giving him as much relief as he wants.

## 2016-09-26 NOTE — Progress Notes (Signed)
Something says he's agitated, NP paged, orders received.

## 2016-09-27 ENCOUNTER — Inpatient Hospital Stay (HOSPITAL_COMMUNITY): Payer: Medicare Other

## 2016-09-27 DIAGNOSIS — R7989 Other specified abnormal findings of blood chemistry: Secondary | ICD-10-CM

## 2016-09-27 DIAGNOSIS — C159 Malignant neoplasm of esophagus, unspecified: Secondary | ICD-10-CM

## 2016-09-27 DIAGNOSIS — R404 Transient alteration of awareness: Secondary | ICD-10-CM

## 2016-09-27 LAB — COMPREHENSIVE METABOLIC PANEL
ALK PHOS: 140 U/L — AB (ref 38–126)
ALT: 466 U/L — ABNORMAL HIGH (ref 17–63)
ANION GAP: 14 (ref 5–15)
AST: 722 U/L — ABNORMAL HIGH (ref 15–41)
Albumin: 2.7 g/dL — ABNORMAL LOW (ref 3.5–5.0)
BUN: 100 mg/dL — ABNORMAL HIGH (ref 6–20)
CALCIUM: 8.5 mg/dL — AB (ref 8.9–10.3)
CHLORIDE: 89 mmol/L — AB (ref 101–111)
CO2: 26 mmol/L (ref 22–32)
Creatinine, Ser: 2.08 mg/dL — ABNORMAL HIGH (ref 0.61–1.24)
GFR, EST AFRICAN AMERICAN: 33 mL/min — AB (ref >60)
GFR, EST NON AFRICAN AMERICAN: 28 mL/min — AB (ref >60)
Glucose, Bld: 73 mg/dL (ref 65–99)
Potassium: 5.1 mmol/L (ref 3.5–5.1)
Sodium: 129 mmol/L — ABNORMAL LOW (ref 135–145)
Total Bilirubin: 1.8 mg/dL — ABNORMAL HIGH (ref 0.3–1.2)
Total Protein: 6.6 g/dL (ref 6.5–8.1)

## 2016-09-27 LAB — AMMONIA: Ammonia: 34 umol/L (ref 9–35)

## 2016-09-27 LAB — CBC
HCT: 31.4 % — ABNORMAL LOW (ref 39.0–52.0)
HEMOGLOBIN: 9.7 g/dL — AB (ref 13.0–17.0)
MCH: 26.2 pg (ref 26.0–34.0)
MCHC: 30.9 g/dL (ref 30.0–36.0)
MCV: 84.9 fL (ref 78.0–100.0)
Platelets: 431 10*3/uL — ABNORMAL HIGH (ref 150–400)
RBC: 3.7 MIL/uL — AB (ref 4.22–5.81)
RDW: 17.3 % — ABNORMAL HIGH (ref 11.5–15.5)
WBC: 18.4 10*3/uL — AB (ref 4.0–10.5)

## 2016-09-27 LAB — GLUCOSE, CAPILLARY
GLUCOSE-CAPILLARY: 73 mg/dL (ref 65–99)
GLUCOSE-CAPILLARY: 91 mg/dL (ref 65–99)
GLUCOSE-CAPILLARY: 76 mg/dL (ref 65–99)
GLUCOSE-CAPILLARY: 76 mg/dL (ref 65–99)

## 2016-09-27 MED ORDER — LORAZEPAM 1 MG PO TABS
1.0000 mg | ORAL_TABLET | Freq: Once | ORAL | Status: AC
  Administered 2016-09-27: 1 mg via ORAL
  Filled 2016-09-27: qty 1

## 2016-09-27 MED ORDER — METOPROLOL SUCCINATE ER 25 MG PO TB24
37.5000 mg | ORAL_TABLET | Freq: Two times a day (BID) | ORAL | Status: DC
  Administered 2016-09-27 – 2016-10-05 (×15): 37.5 mg via ORAL
  Filled 2016-09-27 (×16): qty 2

## 2016-09-27 MED ORDER — HALOPERIDOL LACTATE 5 MG/ML IJ SOLN
1.0000 mg | Freq: Two times a day (BID) | INTRAMUSCULAR | Status: DC | PRN
  Filled 2016-09-27: qty 1

## 2016-09-27 NOTE — Progress Notes (Signed)
Physical Therapy Treatment Patient Details Name: Mark Rivers MRN: [id number] DOB: 1934-10-25 Today's Date: 09/27/2016    History of Present Illness Mark Rivers is a 82 y.o. male with a hx of CAD s/p CABG 10/2015, persistent AFib not on anticoagulation due to prior GI bleed, esophageal adenocarcinoma, DM2, CKD, HTN, HL, COPD, and chronic diastolic CHF, Afib RVR and CHF, FTT with multiple hospitalizations    PT Comments    Patient required max A +2 for all mobility this session and unable to achieve upright posture in standing. Attempted sit to stand from recliner with use of Stedy however pt not assisting. Pt continues to demonstrate cognitive deficits--not sure of baseline. Continue to progress as tolerated with anticipated d/c to SNF for further skilled PT services.     Follow Up Recommendations  SNF;Other (comment)     Equipment Recommendations  None recommended by PT    Recommendations for Other Services       Precautions / Restrictions Precautions Precautions: Fall    Mobility  Bed Mobility Overal bed mobility: Needs Assistance Bed Mobility: Supine to Sit     Supine to sit: +2 for physical assistance;Max assist     General bed mobility comments: cues for sequencing, assist for trunk, LEs and to position at EOB  Transfers Overall transfer level: Needs assistance Equipment used: Rolling walker (2 wheeled) Transfers: Stand Pivot Transfers Sit to Stand: +2 physical assistance;Max assist         General transfer comment: multimodal cues for safe hand placement and technique; attempted using Stedy to stand from recliner however pt not assisting; pt unable to stand, performed squat pivot to chair, placed lift pad under pt and educated NT on pt's current mobility  Ambulation/Gait                 Stairs            Wheelchair Mobility    Modified Rankin (Stroke Patients Only)       Balance Overall balance assessment: Needs assistance;History  of Falls Sitting-balance support: Bilateral upper extremity supported;Feet supported Sitting balance-Leahy Scale: Poor Sitting balance - Comments: posterior lean Postural control: Posterior lean   Standing balance-Leahy Scale: Zero Standing balance comment: unable to stand with RW or stedy this visit                            Cognition Arousal/Alertness: Awake/alert Behavior During Therapy: Anxious Overall Cognitive Status: No family/caregiver present to determine baseline cognitive functioning Area of Impairment: Orientation;Memory;Following commands;Safety/judgement;Problem solving                 Orientation Level: Disoriented to;Time;Situation   Memory: Decreased short-term memory Following Commands: Follows one step commands with increased time (and multimodal cues) Safety/Judgement: Decreased awareness of safety;Decreased awareness of deficits   Problem Solving: Slow processing;Decreased initiation;Requires verbal cues;Requires tactile cues        Exercises      General Comments        Pertinent Vitals/Pain Pain Assessment: Faces Faces Pain Scale: Hurts even more Pain Location: generalized with movement Pain Descriptors / Indicators: Grimacing;Guarding;Moaning;Sore Pain Intervention(s): Limited activity within patient's tolerance;Monitored during session;Repositioned    Home Living                      Prior Function            PT Goals (current goals can now be found in the care plan section)  Acute Rehab PT Goals Patient Stated Goal: did not state Progress towards PT goals: Not progressing toward goals - comment (?cognition)    Frequency    Min 3X/week      PT Plan Current plan remains appropriate    Co-evaluation PT/OT/SLP Co-Evaluation/Treatment: Yes Reason for Co-Treatment: For patient/therapist safety PT goals addressed during session: Mobility/safety with mobility OT goals addressed during session:  Strengthening/ROM      AM-PAC PT "6 Clicks" Daily Activity  Outcome Measure  Difficulty turning over in bed (including adjusting bedclothes, sheets and blankets)?: A Lot Difficulty moving from lying on back to sitting on the side of the bed? : Unable Difficulty sitting down on and standing up from a chair with arms (e.g., wheelchair, bedside commode, etc,.)?: Unable Help needed moving to and from a bed to chair (including a wheelchair)?: A Lot Help needed walking in hospital room?: Total Help needed climbing 3-5 steps with a railing? : Total 6 Click Score: 8    End of Session Equipment Utilized During Treatment: Gait belt;Oxygen Activity Tolerance: Patient limited by pain Patient left: with call bell/phone within reach;in chair;with chair alarm set Nurse Communication: Mobility status;Need for lift equipment PT Visit Diagnosis: Unsteadiness on feet (R26.81);Muscle weakness (generalized) (M62.81)     Time: 7340-3709 PT Time Calculation (min) (ACUTE ONLY): 38 min  Charges:  $Therapeutic Activity: 23-37 mins                    G Codes:       Earney Navy, PTA Pager: [phone]     Darliss Cheney 09/27/2016, 2:38 PM

## 2016-09-27 NOTE — Progress Notes (Signed)
Occupational Therapy Treatment Patient Details Name: Mark Rivers MRN: [id number] DOB: Apr 21, 1934 Today's Date: 09/27/2016    History of present illness Mark Rivers is a 82 y.o. male with a hx of CAD s/p CABG 10/2015, persistent AFib not on anticoagulation due to prior GI bleed, esophageal adenocarcinoma, DM2, CKD, HTN, HL, COPD, and chronic diastolic CHF, Afib RVR and CHF, FTT with multiple hospitalizations   OT comments  Pt with increased weakness this visit. Unable to fully stand with RW and elevated surface or Stedy. Bed level and squat pivot required +2 max assist. Pt yelling out and when asked why he replied, "I don't know." Pt with generalized pain. Pt requiring moderate assistance to don gown, pulling clothing off. Recommended return to bed with lift. Pt continues to be appropriate for SNF level rehab.  Follow Up Recommendations  SNF;Supervision/Assistance - 24 hour    Equipment Recommendations       Recommendations for Other Services      Precautions / Restrictions Precautions Precautions: Fall       Mobility Bed Mobility Overal bed mobility: Needs Assistance Bed Mobility: Supine to Sit     Supine to sit: +2 for physical assistance;Max assist     General bed mobility comments: cues for sequencing, assist for trunk, LEs and to position at EOB  Transfers Overall transfer level: Needs assistance Equipment used: Rolling walker (2 wheeled) Transfers: Stand Pivot Transfers Sit to Stand: +2 physical assistance;Max assist         General transfer comment: pt unable to stand, performed squat pivot to chair, placed lift pad under pt and educated NT on pt's current mobility    Balance Overall balance assessment: Needs assistance;History of Falls Sitting-balance support: Bilateral upper extremity supported;Feet supported Sitting balance-Leahy Scale: Poor Sitting balance - Comments: posterior lean Postural control: Posterior lean   Standing balance-Leahy Scale:  Zero Standing balance comment: unable to stand with RW or stedy this visit                           ADL either performed or assessed with clinical judgement   ADL                   Upper Body Dressing : Moderate assistance;Bed level       Toilet Transfer: +2 for physical assistance;Maximal assistance;Squat-pivot Toilet Transfer Details (indicate cue type and reason): simulated to chair           General ADL Comments: no SOB, on 3.5 L 02, HR to 126 with mobility     Vision       Perception     Praxis      Cognition Arousal/Alertness: Awake/alert Behavior During Therapy: Anxious Overall Cognitive Status: No family/caregiver present to determine baseline cognitive functioning Area of Impairment: Orientation;Memory;Following commands;Safety/judgement;Problem solving                 Orientation Level: Disoriented to;Time;Situation   Memory: Decreased short-term memory Following Commands: Follows one step commands with increased time (and multimodal cues) Safety/Judgement: Decreased awareness of safety;Decreased awareness of deficits   Problem Solving: Slow processing;Decreased initiation;Requires verbal cues;Requires tactile cues          Exercises     Shoulder Instructions       General Comments      Pertinent Vitals/ Pain       Pain Assessment: Faces Faces Pain Scale: Hurts even more Pain Location: generalized with movement Pain Descriptors / Indicators:  Grimacing;Guarding;Moaning;Sore Pain Intervention(s): Monitored during session;Repositioned;Limited activity within patient's tolerance  Home Living                                          Prior Functioning/Environment              Frequency  Min 2X/week        Progress Toward Goals  OT Goals(current goals can now be found in the care plan section)     Acute Rehab OT Goals Patient Stated Goal: did not state OT Goal Formulation: With  patient Time For Goal Achievement: 10/09/16 Potential to Achieve Goals: Good  Plan Discharge plan remains appropriate    Co-evaluation    PT/OT/SLP Co-Evaluation/Treatment: Yes Reason for Co-Treatment: For patient/therapist safety   OT goals addressed during session: Strengthening/ROM      AM-PAC PT "6 Clicks" Daily Activity     Outcome Measure   Help from another person eating meals?: A Little Help from another person taking care of personal grooming?: A Lot Help from another person toileting, which includes using toliet, bedpan, or urinal?: Total Help from another person bathing (including washing, rinsing, drying)?: A Lot Help from another person to put on and taking off regular upper body clothing?: A Lot Help from another person to put on and taking off regular lower body clothing?: Total 6 Click Score: 11    End of Session Equipment Utilized During Treatment: Gait belt;Rolling walker;Oxygen  OT Visit Diagnosis: Unsteadiness on feet (R26.81);Other abnormalities of gait and mobility (R26.89);History of falling (Z91.81);Muscle weakness (generalized) (M62.81)   Activity Tolerance Patient limited by fatigue;Patient limited by pain   Patient Left in chair;with call bell/phone within reach;with chair alarm set   Nurse Communication Need for lift equipment        Time: 1015-1053 OT Time Calculation (min): 38 min  Charges: OT General Charges $OT Visit: 1 Visit OT Treatments $Therapeutic Activity: 8-22 mins  09/27/2016 Nestor Lewandowsky, OTR/L Pager: [phone] Werner Lean Haze Boyden 09/27/2016, 11:26 AM

## 2016-09-27 NOTE — Progress Notes (Signed)
Patient anxious, fidgeting around, has been yelling all night, NP paged- order for ativan received.

## 2016-09-27 NOTE — Progress Notes (Signed)
DAILY PROGRESS NOTE   Patient Name: Mark Rivers Date of Encounter: 09/27/2016  Hospital Problem List   Principal Problem:   Acute on chronic respiratory failure with hypoxia [facility]) Active Problems:   Diabetes mellitus with stage 3 chronic kidney disease (HCC)   Persistent atrial fibrillation (HCC)   Acute on chronic diastolic congestive heart failure (HCC)   Anemia of chronic disease   Palliative care by specialist   DNR (do not resuscitate)   Acute renal failure ([place])   Atrial fibrillation with RVR ([place])   Transient alteration of awareness    Chief Complaint   No complaints  Subjective   Net even with fluids. Creatinine has been stable. In a-fib with RVR - HR in the 110-130 range overnight.   Objective   Vitals:   09/26/16 1935 09/27/16 0611 09/27/16 0940 09/27/16 1300  BP: 128/74 (!) 126/96 123/84 (!) 103/55  Pulse: (!) 132 (!) 122 (!) 107 (!) 111  Resp: '18 18  18  ' Temp: 97.6 F (36.4 C) 97.6 F (36.4 C)  (!) 97.5 F (36.4 C)  TempSrc: Oral Oral  Oral  SpO2: 96% 99%  98%  Weight:  203 lb 12.8 oz (92.4 kg)    Height:        Intake/Output Summary (Last 24 hours) at 09/27/16 1514 Last data filed at 09/27/16 0900  Gross per 24 hour  Intake              840 ml  Output              200 ml  Net              640 ml   Filed Weights   09/25/16 0347 09/26/16 0416 09/27/16 0611  Weight: 200 lb 12.8 oz (91.1 kg) 202 lb 6.4 oz (91.8 kg) 203 lb 12.8 oz (92.4 kg)    Physical Exam   General appearance: alert and no distress Neck: no carotid bruit, no JVD and thyroid not enlarged, symmetric, no tenderness/mass/nodules Lungs: diminished breath sounds bilaterally Heart: irregularly irregular rhythm Abdomen: soft, non-tender; bowel sounds normal; no masses,  no organomegaly Extremities: extremities normal, atraumatic, no cyanosis or edema Pulses: 2+ and symmetric Skin: Skin color, texture, turgor normal. No rashes or lesions Neurologic: Mental status: Not  oriented to place or date Psych: Pleasant  Inpatient Medications    Scheduled Meds: . enoxaparin (LOVENOX) injection  30 mg Subcutaneous Daily  . furosemide  80 mg Intravenous TID  . insulin aspart  0-9 Units Subcutaneous TID WC  . insulin glargine  30 Units Subcutaneous QHS  . metoprolol succinate  25 mg Oral BID  . pantoprazole  40 mg Oral Q0600  . sodium chloride flush  3 mL Intravenous Q12H  . traZODone  100 mg Oral QHS  . vitamin C  500 mg Oral Daily    Continuous Infusions: . sodium chloride      PRN Meds: sodium chloride, acetaminophen, guaiFENesin-dextromethorphan, haloperidol lactate, metoprolol tartrate, ondansetron (ZOFRAN) IV, sodium chloride flush, traMADol   Labs   Results for orders placed or performed during the hospital encounter of 09/21/16 (from the past 48 hour(s))  Glucose, capillary     Status: Abnormal   Collection Time: 09/25/16  4:21 PM  Result Value Ref Range   Glucose-Capillary 118 (H) 65 - 99 mg/dL   Comment 1 Notify RN   Glucose, capillary     Status: Abnormal   Collection Time: 09/25/16  8:28 PM  Result Value Ref Range  Glucose-Capillary 136 (H) 65 - 99 mg/dL  Comprehensive metabolic panel     Status: Abnormal   Collection Time: 09/26/16  5:27 AM  Result Value Ref Range   Sodium 130 (L) 135 - 145 mmol/L   Potassium 4.5 3.5 - 5.1 mmol/L   Chloride 92 (L) 101 - 111 mmol/L   CO2 26 22 - 32 mmol/L   Glucose, Bld 111 (H) 65 - 99 mg/dL   BUN 90 (H) 6 - 20 mg/dL   Creatinine, Ser 2.06 (H) 0.61 - 1.24 mg/dL   Calcium 8.3 (L) 8.9 - 10.3 mg/dL   Total Protein 6.5 6.5 - 8.1 g/dL   Albumin 2.6 (L) 3.5 - 5.0 g/dL   AST 579 (H) 15 - 41 U/L   ALT 386 (H) 17 - 63 U/L   Alkaline Phosphatase 135 (H) 38 - 126 U/L   Total Bilirubin 1.7 (H) 0.3 - 1.2 mg/dL   GFR calc non Af Amer 29 (L) >60 mL/min   GFR calc Af Amer 33 (L) >60 mL/min    Comment: (NOTE) The eGFR has been calculated using the CKD EPI equation. This calculation has not been validated in  all clinical situations. eGFR's persistently <60 mL/min signify possible Chronic Kidney Disease.    Anion gap 12 5 - 15  CBC     Status: Abnormal   Collection Time: 09/26/16  5:27 AM  Result Value Ref Range   WBC 18.0 (H) 4.0 - 10.5 K/uL   RBC 3.65 (L) 4.22 - 5.81 MIL/uL   Hemoglobin 9.4 (L) 13.0 - 17.0 g/dL   HCT 30.8 (L) 39.0 - 52.0 %   MCV 84.4 78.0 - 100.0 fL   MCH 25.8 (L) 26.0 - 34.0 pg   MCHC 30.5 30.0 - 36.0 g/dL   RDW 17.2 (H) 11.5 - 15.5 %   Platelets 399 150 - 400 K/uL  Glucose, capillary     Status: Abnormal   Collection Time: 09/26/16  7:42 AM  Result Value Ref Range   Glucose-Capillary 104 (H) 65 - 99 mg/dL   Comment 1 Notify RN   Glucose, capillary     Status: Abnormal   Collection Time: 09/26/16 11:16 AM  Result Value Ref Range   Glucose-Capillary 109 (H) 65 - 99 mg/dL  Sedimentation rate     Status: Abnormal   Collection Time: 09/26/16  2:34 PM  Result Value Ref Range   Sed Rate 29 (H) 0 - 16 mm/hr  Glucose, capillary     Status: Abnormal   Collection Time: 09/26/16  4:40 PM  Result Value Ref Range   Glucose-Capillary 125 (H) 65 - 99 mg/dL   Comment 1 Notify RN   Glucose, capillary     Status: Abnormal   Collection Time: 09/26/16  9:00 PM  Result Value Ref Range   Glucose-Capillary 124 (H) 65 - 99 mg/dL  Comprehensive metabolic panel     Status: Abnormal   Collection Time: 09/27/16  4:09 AM  Result Value Ref Range   Sodium 129 (L) 135 - 145 mmol/L   Potassium 5.1 3.5 - 5.1 mmol/L   Chloride 89 (L) 101 - 111 mmol/L   CO2 26 22 - 32 mmol/L   Glucose, Bld 73 65 - 99 mg/dL   BUN 100 (H) 6 - 20 mg/dL   Creatinine, Ser 2.08 (H) 0.61 - 1.24 mg/dL   Calcium 8.5 (L) 8.9 - 10.3 mg/dL   Total Protein 6.6 6.5 - 8.1 g/dL   Albumin 2.7 (L) 3.5 -  5.0 g/dL   AST 722 (H) 15 - 41 U/L   ALT 466 (H) 17 - 63 U/L   Alkaline Phosphatase 140 (H) 38 - 126 U/L   Total Bilirubin 1.8 (H) 0.3 - 1.2 mg/dL   GFR calc non Af Amer 28 (L) >60 mL/min   GFR calc Af Amer 33 (L)  >60 mL/min    Comment: (NOTE) The eGFR has been calculated using the CKD EPI equation. This calculation has not been validated in all clinical situations. eGFR's persistently <60 mL/min signify possible Chronic Kidney Disease.    Anion gap 14 5 - 15  CBC     Status: Abnormal   Collection Time: 09/27/16  4:09 AM  Result Value Ref Range   WBC 18.4 (H) 4.0 - 10.5 K/uL   RBC 3.70 (L) 4.22 - 5.81 MIL/uL   Hemoglobin 9.7 (L) 13.0 - 17.0 g/dL   HCT 31.4 (L) 39.0 - 52.0 %   MCV 84.9 78.0 - 100.0 fL   MCH 26.2 26.0 - 34.0 pg   MCHC 30.9 30.0 - 36.0 g/dL   RDW 17.3 (H) 11.5 - 15.5 %   Platelets 431 (H) 150 - 400 K/uL  Glucose, capillary     Status: None   Collection Time: 09/27/16  7:49 AM  Result Value Ref Range   Glucose-Capillary 73 65 - 99 mg/dL  Glucose, capillary     Status: None   Collection Time: 09/27/16 11:31 AM  Result Value Ref Range   Glucose-Capillary 91 65 - 99 mg/dL  Ammonia     Status: None   Collection Time: 09/27/16 11:50 AM  Result Value Ref Range   Ammonia 34 9 - 35 umol/L    ECG   Afib - Personally Reviewed  Telemetry   A-fib with RVR - Personally Reviewed  Radiology    Ct Head Wo Contrast  Result Date: 09/27/2016 CLINICAL DATA:  Acute confusion EXAM: CT HEAD WITHOUT CONTRAST TECHNIQUE: Contiguous axial images were obtained from the base of the skull through the vertex without intravenous contrast. COMPARISON:  08/31/2016 FINDINGS: Brain: Diffuse atrophic changes and chronic white matter ischemic changes are noted. No findings to suggest acute hemorrhage, acute infarction or space-occupying mass lesion are seen. Basal ganglia calcifications are noted bilaterally. Vascular: No hyperdense vessel or unexpected calcification. Skull: Normal. Negative for fracture or focal lesion. Sinuses/Orbits: No acute finding. Other: None. IMPRESSION: Chronic atrophic and ischemic changes stable from the prior exam. No acute abnormality noted. Electronically Signed   By: Inez Catalina M.D.   On: 09/27/2016 14:04   Dg Chest Port 1 View  Result Date: 09/26/2016 CLINICAL DATA:  Congestive heart failure, followup EXAM: PORTABLE CHEST 1 VIEW COMPARISON:  Portable chest x-ray of 09/24/2016 and CT chest of 02/21/2016 FINDINGS: The lungs are not optimally aerated. Therefore there is some volume loss at the lung bases. There does appear to be a left pleural effusion and possibly a small right pleural effusion as well. Cardiomegaly is stable and there may be mild pulmonary vascular congestion suggesting mild CHF. Median sternotomy sutures are noted from prior CABG. No acute bony abnormality is seen. IMPRESSION: Probable mild CHF with cardiomegaly, small effusions, and pulmonary vascular congestion. Electronically Signed   By: Ivar Drape M.D.   On: 09/26/2016 14:52   US Abdomen Limited Ruq  Result Date: 09/26/2016 CLINICAL DATA:  Congestive heart failure. EXAM: ULTRASOUND ABDOMEN LIMITED RIGHT UPPER QUADRANT COMPARISON:  Ultrasound kidneys 09/22/2016. CT abdomen and pelvis 02/21/2016 FINDINGS: Gallbladder: No gallstones  or wall thickening visualized. No sonographic Murphy sign noted by sonographer. Common bile duct: Diameter: 3 mm, normal Liver: Diffusely increased hepatic parenchymal echotexture suggesting fatty infiltration. No focal lesions identified. Portal vein is patent on color Doppler imaging with normal direction of blood flow towards the liver. IMPRESSION: Diffuse fatty infiltration of the liver. No evidence of cholelithiasis or cholecystitis. Electronically Signed   By: Lucienne Capers M.D.   On: 09/26/2016 21:15    Cardiac Studies   N/A  Assessment   1. Principal Problem: 2.   Acute on chronic respiratory failure with hypoxia (HCC) 3. Active Problems: 4.   Diabetes mellitus with stage 3 chronic kidney disease (HCC) 5.   Persistent atrial fibrillation ([place]) 6.   Acute on chronic diastolic congestive heart failure ([place]) 7.   Anemia of chronic disease 8.    Palliative care by specialist 9.   DNR (do not resuscitate) 10.   Acute renal failure ([place]) 11.   Atrial fibrillation with RVR ([place]) 12.   Transient alteration of awareness 13.   Plan   1. AFib remains not well rate-controlled- increase metoprolol to 37.5 mg BID starting tonight.  Time Spent Directly with Patient:  I have spent a total of 15 minutes with the patient reviewing hospital notes, telemetry, EKGs, labs and examining the patient as well as establishing an assessment and plan that was discussed personally with the patient. > 50% of time was spent in direct patient care.  Length of Stay:  LOS: 5 days   Pixie Casino, MD, [place]  Attending Cardiologist  Direct Dial: [phone]  Fax: [phone]  Website:  [email or web address] 09/27/2016, 3:14 PM

## 2016-09-27 NOTE — Progress Notes (Signed)
Patient ID: Mark Rivers, male   DOB: [birth year], 82 y.o.   MRN: [id number]  This NP visited patient at the bedside and made f/u phone call to son  to continue discussion regarding diagnosis , prognosis, GOCs, EOL wishes disposition and options.  Discussion again had  with family with concerns that patient is not improving medically in spite of medical interventions.  He continues to be intermittently confused and agitated.  Discussed multi-factorial facets to delirium specific to acute illness, age, hospitalization, chronic pain, and possible underlying dementia.  Discussed use of medications specifically to his delirium.  Family is reluctant to utilize medications fearing they may make things worse.   Ativan has been discontinued and low dose Haldol is now first option if needed  to help him rest at night.  Consider low dose anti-psychotic at bedtime, family needs to be on board with trial.    I am told daughter will be flying into [place] tomorrow afternoon.   Discussed with family the importance of continued conversation with patient and each other and their  medical providers regarding overall plan of care and treatment options,  ensuring decisions are within the context of the patients values and GOCs.  Questions and concerns addressed  Discussed with Dr Algis Liming and bedside nurse  Time in  1730         Time out    1755       Total time spent on the unit 25 min  Greater than 50% of the time was spent in counseling and coordination of care  Wadie Lessen NP  Palliative Medicine Team Team Phone # [phone] Pager [phone]  Palliative medicine will continue to support holistically

## 2016-09-27 NOTE — Consult Note (Signed)
[facility] Consult: 10:19 AM 09/27/2016  LOS: 5 days    Referring Provider: Dr Algis Liming  Primary Care Physician:  [name], MD Primary Gastroenterologist:  Dr. Fuller Plan     Reason for Consultation:  Elevated LFTs   HPI: Mark Rivers is a 82 y.o. male.   PMH CHF-d.  CKD.  GERD.  Asbestosis, COPD.  Atrial fibrillation, previous Coumadin but no recent anticoagulation due to hx anemia/Heme + in 02/2016.  CAD, stent 1988, CABG 10/26/2015.  On no Plavix etc.  Post CABG anemia requiring 2 PRBCs. Post CABG tracheostomy for management of vent support and wean. Discharged to [place] for management of severe deconditioning, discharged to home 12/15/15.  Protein malnutrition requiring tube feeds in 11/2015.  Heme + anemia requiring 2 U  PRBCs in 02/2015.  Diagnosed with Adenocarcinoma of GEJ and underwent EGD/EUS 05/2016.   08/2000 colonoscopy.  Tortuous, spastic colon.  Two 3 - 4 mm rectal polyps removed. Path: hyperplastic polyp mild active, non-specific proctocolitis 09/2011 colonoscopy.  Mild sigmoid diverticulosis, small internal hemorrhoids. No recurrent polyps. 02/2016 EGD for FOBT +, anemia.  Dr Fuller Plan. Bleeding AVM in gastric body was APCd. Non-bleeding, 6 mm gastric cardia nodule/papule biopsied, path: adenocarcinoma.  Erosive duodenitis.   03/2016 EUS.  Dr [name].  A 1.5cm, non-circumferential mass on the esophageal side of the GE junction. Staged T1a (possible T1b) N0 M0. He is a poor surgical candidate but this may be amenable to endoscopic resection. 05/18/2016 EGD with EMR and complete removal of partially obstructing GE Jx tumor.  Dr Adria Devon at [facility]  Pathology: Moderately differentiated intramucosal adenocarcinoma arising in polypoid high-grade dysplasia, with focal areas of muscularis mucosa invasion - No  definitive submucosal or lymphovascular space invasion identified - No intestinal metaplasia identified in the background cardio-oxyntic type and squamocolumnar junction mucosa - Margin status indeterminate (fragmented specimen and focal tangential orientation) 07/27/16 EGD.  Dr Adria Devon.  Mild nodularity at site of prior EMR. No residual tumor tissue was noted. Old tattoo site was present. The decision was made to ablate with spray cryotherapy.  Normal stomach.  Plan was for repeat EGD with treatment in 09/2016.   Admitted 8/23 - 6/38 with diastolic heart failure exacerbation, weakness, deconditioning.   Discharged to home, living alone, with home PT.  .    Admitted 9/13 with acute on chronic diastolic/systolic CHF with hypoxia, Afib with RVR.  Delerium.  AKI.  Hyponatremia.  WBCs elevated to 18.4.  Hgb 9.7, which is upper end of his normal levels.  LFTs rising, measurements starting 9/17 not earlier in admission. AST/ALT:  564/380 >> 579/386 >> 722/466.   AST/ALT was 71/50 on 08/18/16. ALK phos:  125 >> 135 >> 140.   Normal 6 weeks ago T bili: 1.4 >> 1.7 >> 1.8.  Normal 6 weeks ago.  Ammonia 31.  No coags have been checked.    Ultrasound 9/18:  Fatty liver.  No GB stones or cholecystitis.  3 mm CBD. Pt's Lipitor was stopped 9/17.   Previous charting states pt quit ETOH in 2006.  Past Medical History:  Diagnosis Date  . Allergy   . Back pain   . Chronic diastolic CHF (congestive heart failure) ([place]) 12/21/2015   A. Echo 10/17: Moderate LVH, EF 55-60, normal wall motion, grade 2 diastolic dysfunction, mild aortic stenosis (mean 14, peak 34), MAC, mild LAE    . Chronic respiratory failure ([place])   . CKD (chronic kidney disease) stage 3, GFR 30-59 ml/min 03/04/2014  . Coronary artery disease    a. s/p remote POBA in 1988 // b. NSTEMI in 10/17 >> LHC with 99% LM stenosis >> emergent CABG (L-LAD, S-OM2, S-PDA) - post op course complicated (prolonged ventilation, s/p Trach, AFib, anemia req  transfusion, bacteremia >> DC to LTAC)  . Diabetes mellitus   . Difficult intubation    difficult airway/FYI note 11/01/2015  . Esophageal adenocarcinoma ([place])    a. found by EGD 03/2016..  . GERD (gastroesophageal reflux disease)   . GI bleed 01/2016   a. 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 F/u EGD with esophageal adenocarcinoma 03/2016.  [name] History of nuclear stress test    a. Myoview 6/14 - EF 51, no ischemia or scar; Low Risk  . Hyperlipemia   . Hypertension   . Methicillin resistant Staphylococcus epidermidis infection 11/2015   a. during adm for CABG.  . Neuropathy   . On home oxygen therapy    "2L; 24/7" (02/09/2016)  . Persistent atrial fibrillation ([place]) 10/25/2015  . Sinus bradycardia    a. Prior HR 54 when in sinus rhythm 11/2015.  . Stroke [facility])    a. old L pontine infarcts seen on CT 04/2016.    Past Surgical History:  Procedure Laterality Date  . CARDIAC CATHETERIZATION N/A 10/26/2015   Procedure: Left Heart Cath and Coronary Angiography;  Surgeon: Troy Sine, MD;  Location: [facility];  Service: Cardiovascular;  Laterality: N/A;  . CATARACT EXTRACTION W/ INTRAOCULAR LENS  IMPLANT, BILATERAL  2008  . COLONOSCOPY    . CORONARY ANGIOPLASTY WITH STENT PLACEMENT  1988  . CORONARY ARTERY BYPASS GRAFT N/A 10/26/2015   Procedure: CORONARY ARTERY BYPASS GRAFTING (CABG) x3(LIMA to LA, SVG to OM1, SVG to PDA) with EVH from the left thigh and partial lower leg greater saphenous vein and left internal mammary artery;  Surgeon: Ivin Poot, MD;  Location: [facility];  Service: Open Heart Surgery;  Laterality: N/A;  . ESOPHAGOGASTRODUODENOSCOPY (EGD) WITH PROPOFOL N/A 02/11/2016   Procedure: ESOPHAGOGASTRODUODENOSCOPY (EGD) WITH PROPOFOL;  Surgeon: Ladene Artist, MD;  Location: [facility] ENDOSCOPY;  Service:  Endoscopy;  Laterality: N/A;  . EUS N/A 03/09/2016   Procedure: UPPER ENDOSCOPIC ULTRASOUND (EUS) RADIAL;  Surgeon: Milus Banister, MD;  Location: [facility] ENDOSCOPY;  Service: Endoscopy;  Laterality: N/A;  . TEE WITHOUT CARDIOVERSION N/A 10/26/2015   Procedure: TRANSESOPHAGEAL ECHOCARDIOGRAM (TEE);  Surgeon: Ivin Poot, MD;  Location: [facility] [place];  Service: Open Heart Surgery;  Laterality: N/A;  . TONSILLECTOMY      Prior to Admission medications   Medication Sig Start Date End Date Taking? Authorizing Provider  atorvastatin (LIPITOR) 80 MG tablet Take 1 tablet (80 mg total) by mouth daily. 03/31/16  Yes Josue Hector, MD  cholecalciferol (VITAMIN D) 1000 units tablet Take 1,000 Units by mouth daily.   Yes [provider]  diphenhydramine-acetaminophen (TYLENOL PM) 25-500 MG TABS tablet Take 2 tablets  by mouth at bedtime.   Yes [provider]  fenofibrate 54 MG tablet Take 54 mg by mouth daily.   Yes [provider]  furosemide (LASIX) 40 MG tablet Take 1.5 tablets (60 mg total) by mouth 2 (two) times daily. 08/24/16 08/24/17 Yes Josue Hector, MD  Glucosamine-Chondroit-Vit C-Mn (GLUCOSAMINE CHONDR 1500 COMPLX PO) Take 2 tablets by mouth daily.    Yes [provider]  Insulin Glargine (TOUJEO SOLOSTAR) 300 UNIT/ML SOPN Inject 30 Units into the skin at bedtime.   Yes [provider]  metoprolol succinate (TOPROL-XL) 50 MG 24 hr tablet Take 1 tablet (50 mg total) by mouth daily. Take with or immediately following a meal. 02/16/16 09/21/16 Yes Weaver, Scott T, PA-C  OXYGEN Inhale 2 L/min into the lungs continuous.   Yes [provider]  pantoprazole (PROTONIX) 40 MG tablet Take 1 tablet (40 mg total) by mouth daily at 6 (six) AM. 02/14/16  Yes Allie Bossier, MD  traMADol (ULTRAM) 50 MG tablet Take by mouth every 6 (six) hours as needed.   Yes [provider]  traZODone (DESYREL) 100 MG tablet Take 100 mg by mouth at bedtime.    Yes [provider]  vitamin C (ASCORBIC ACID) 500 MG tablet Take 500 mg by mouth daily.   Yes [provider]    Scheduled Meds: . enoxaparin (LOVENOX) injection  30 mg Subcutaneous Daily  . furosemide  80 mg Intravenous TID  . insulin aspart  0-9 Units Subcutaneous TID WC  . insulin glargine  30 Units Subcutaneous QHS  . metoprolol succinate  25 mg Oral BID  . pantoprazole  40 mg Oral Q0600  . sodium chloride flush  3 mL Intravenous Q12H  . traZODone  100 mg Oral QHS  . vitamin C  500 mg Oral Daily   Infusions: . sodium chloride     PRN Meds: sodium chloride, acetaminophen, guaiFENesin-dextromethorphan, metoprolol tartrate, ondansetron (ZOFRAN) IV, sodium chloride flush, traMADol   Allergies as of 09/21/2016 - Review Complete 09/21/2016  Allergen Reaction Noted  . Amoxicillin Other (See Comments) 09/05/2011  . Cephalexin Other (See Comments) 09/05/2011  . Penicillin g Other (See Comments) 03/08/2015    Family History  Problem Relation Age of Onset  . Diabetes Father   . Colon cancer Neg Hx   . Esophageal cancer Neg Hx   . Stomach cancer Neg Hx   . Rectal cancer Neg Hx   . Allergic rhinitis Neg Hx   . Angioedema Neg Hx   . Asthma Neg Hx   . Eczema Neg Hx   . Immunodeficiency Neg Hx   . Urticaria Neg Hx   . Liver disease Neg Hx     Social History   Social History  . Marital status: Widowed    Spouse name: N/A  . Number of children: N/A  . Years of education: N/A   Occupational History  . retired    Social History Main Topics  . Smoking status: Former Smoker    Packs/day: 2.00    Years: 30.00    Types: Cigarettes    Quit date: 06/28/2004  . Smokeless tobacco: Never Used  . Alcohol use No     Comment: QUIT 06/28/2004  . Drug use: No  . Sexual activity: Yes   Other Topics Concern  . Not on file   Social History Narrative  . No narrative on file    REVIEW OF SYSTEMS: Unable to obtain much of a ROS due  to pt not answering questions and yelling  sporadically C/o of leg pain.  Denies abd pain and nausea.     PHYSICAL EXAM: Vital signs in last 24 hours: Vitals:   09/27/16 0611 09/27/16 0940  BP: (!) 126/96 123/84  Pulse: (!) 122 (!) 107  Resp: 18   Temp: 97.6 F (36.4 C)   SpO2: 99%    Wt Readings from Last 3 Encounters:  09/27/16 92.4 kg (203 lb 12.8 oz)  09/02/16 89.3 kg (196 lb 12.8 oz)  08/24/16 90.7 kg (200 lb)    General: elderly WM, looks unwell but not toxic Head:  No asymmetry or swelling.  No signs of  Head trauma  Eyes:  No scleral icterus or pallor Ears:  Slightly HOH  Nose:  No discharge or congestion Mouth:  Moist and clear oral MM.  Tongue midline.  Edentulous, full denture on bottom, none on top Neck:  No mass or JVD.   Lungs:  Crackles at bases, overall reduced BS.  Loose/wet cough Heart: Irreg, irreg.  Rate in  Abdomen:  Soft, NT, ND.  No mass or HSM.  No hernias.  Active BS.   Rectal: deferred   Musc/Skeltl: no joint redness or swelling  Extremities:  Trace 1+pedal edema.  No redness or tenderness in legs  Neurologic:  Alert, agitated.  Oriented x 0.   Skin:  No rashes or sores on extremities, trunk but did not examine buttocks as he was in bedside chair Tattoos:  none Nodes:  No cervical adenopathy.     Psych:  Delerious.  Intake/Output from previous day: 09/18 0701 - 09/19 0700 In: 780 [P.O.:780] Out: 620 [Urine:620] Intake/Output this shift: No intake/output data recorded.  LAB RESULTS:  Recent Labs  09/25/16 0448 09/26/16 0527 09/27/16 0409  WBC 18.1* 18.0* 18.4*  HGB 9.4* 9.4* 9.7*  HCT 30.8* 30.8* 31.4*  PLT 368 399 431*   BMET Lab Results  Component Value Date   NA 129 (L) 09/27/2016   NA 130 (L) 09/26/2016   NA 131 (L) 09/25/2016   K 5.1 09/27/2016   K 4.5 09/26/2016   K 4.7 09/25/2016   CL 89 (L) 09/27/2016   CL 92 (L) 09/26/2016   CL 93 (L) 09/25/2016   CO2 26 09/27/2016   CO2 26 09/26/2016   CO2 27 09/25/2016   GLUCOSE 73 09/27/2016   GLUCOSE 111 (H)  09/26/2016   GLUCOSE 142 (H) 09/25/2016   BUN 100 (H) 09/27/2016   BUN 90 (H) 09/26/2016   BUN 89 (H) 09/25/2016   CREATININE 2.08 (H) 09/27/2016   CREATININE 2.06 (H) 09/26/2016   CREATININE 2.41 (H) 09/25/2016   CALCIUM 8.5 (L) 09/27/2016   CALCIUM 8.3 (L) 09/26/2016   CALCIUM 8.3 (L) 09/25/2016   LFT  Recent Labs  09/25/16 0448 09/26/16 0527 09/27/16 0409  PROT 6.1* 6.5 6.6  ALBUMIN 2.7* 2.6* 2.7*  AST 564* 579* 722*  ALT 380* 386* 466*  ALKPHOS 125 135* 140*  BILITOT 1.4* 1.7* 1.8*  BILIDIR 0.6*  --   --   IBILI 0.8  --   --    PT/INR Lab Results  Component Value Date   INR 1.32 02/11/2016   INR 3.03 02/10/2016   INR 3.11 02/09/2016   Hepatitis Panel No results for input(s): HEPBSAG, HCVAB, HEPAIGM, HEPBIGM in the last 72 hours. C-Diff No components found for: CDIFF Lipase     Component Value Date/Time   LIPASE 21 05/07/2016 0950    Drugs of Abuse  No results found for: LABOPIA, COCAINSCRNUR, LABBENZ, AMPHETMU, THCU, LABBARB   RADIOLOGY STUDIES: Dg Chest Port 1 View  Result Date: 09/26/2016 CLINICAL DATA:  Congestive heart failure, followup EXAM: PORTABLE CHEST 1 VIEW COMPARISON:  Portable chest x-ray of 09/24/2016 and CT chest of 02/21/2016 FINDINGS: The lungs are not optimally aerated. Therefore there is some volume loss at the lung bases. There does appear to be a left pleural effusion and possibly a small right pleural effusion as well. Cardiomegaly is stable and there may be mild pulmonary vascular congestion suggesting mild CHF. Median sternotomy sutures are noted from prior CABG. No acute bony abnormality is seen. IMPRESSION: Probable mild CHF with cardiomegaly, small effusions, and pulmonary vascular congestion. Electronically Signed   By: Ivar Drape M.D.   On: 09/26/2016 14:52   US Abdomen Limited Ruq  Result Date: 09/26/2016 CLINICAL DATA:  Congestive heart failure. EXAM: ULTRASOUND ABDOMEN LIMITED RIGHT UPPER QUADRANT COMPARISON:  Ultrasound  kidneys 09/22/2016. CT abdomen and pelvis 02/21/2016 FINDINGS: Gallbladder: No gallstones or wall thickening visualized. No sonographic Murphy sign noted by sonographer. Common bile duct: Diameter: 3 mm, normal Liver: Diffusely increased hepatic parenchymal echotexture suggesting fatty infiltration. No focal lesions identified. Portal vein is patent on color Doppler imaging with normal direction of blood flow towards the liver. IMPRESSION: Diffuse fatty infiltration of the liver. No evidence of cholelithiasis or cholecystitis. Electronically Signed   By: Lucienne Capers M.D.   On: 09/26/2016 21:15     IMPRESSION:   *   Elevated LFTs.  Suspect due to passive congestion from heart failure.  Ultrasound unremarkable.   *  Esophageal adenocarcinoma.  Dx 02/2016.  EMR 05/2016.  EGD with no visible recurrence.  Dr Adria Devon treated area with cryoablation of residual nodularity.  Due repeat EGD this month, not clear this has been arranged.   *  CHF, acute on chronic systolic/diastolic  *  AKI.    *  delirium with normal ammonia level.   CT head 08/31/16 post fall/trauma: atrophy, white matter ischemia.      PLAN:     *  ? Any further imaging: CT, MRI/MRCP.  At present state of mind, not sure either is feasible and would need non-contrast studies which are suboptimal.    *  Though I doubt infectious causes, will order acute hepatitis profile.   Also check PT/INR along with LFTs in AM.    Azucena Freed  09/27/2016, 10:19 AM Pager: [phone]  GI ATTENDING  History, laboratories, x-rays, prior endoscopy reports reviewed. Patient personally seen and examined. Agree with comprehensive consultation note as outlined above. Extremely complicated patient. Asked to see regarding elevated liver test. Essentially normal last month. Medications reviewed. I suspect that his elevation secondary to passive congestion of the liver from heart failure. Medication reaction is possible though I do not see obvious  culprits. Recommend supportive care and trending LFTs.  Docia Chuck. Geri Seminole., M.D. [facility] Division of Gastroenterology

## 2016-09-27 NOTE — Care Management Note (Signed)
Case Management Note  Patient Details  Name: Angello Chien MRN: [id number] Date of Birth: 04-20-1934  Subjective/Objective:    Acute on Chronic Resp Failure              Action/Plan: Patient known to me from previous admission; CM will continue to follow for DCP; B Leanne Chang  09/01/2016 - Patient lives at home alone; PCP: [name]., MD; has private insurance with Medicare/[employer] with prescription drug coverage; [facility] choice [facility]; [facility] choice offered, pt chose [facility]; Butch Penny with [facility] called for arrangements; ; DME - home oxygen, rolling walker, glucometer at home; He has a cleaning Joselyn Arrow that does the cooking and takes him to his apts; CM will continue to follow for DCP.  Expected Discharge Date:    possibly 10/03/2016              Expected Discharge Plan:  [place]  Discharge planning Services  CM Consult     [facility] Agency:  [place]  Status of Service:  In process, will continue to follow  Sherrilyn Rist [phone] 09/27/2016, 2:21 PM

## 2016-09-27 NOTE — Progress Notes (Signed)
PROGRESS NOTE  Mark Rivers EGB:[id number] DOB: [birth year] DOA: 09/21/2016 PCP: [name], MD  Brief narrative: 82 year old male with PMH of persistent A. fib poor candidate for anticoagulation due to bleeding risks, CAD, CVA, chronic diastolic CHF with last EF 55-60 percent, chronic respiratory failure on home oxygen 2-3 L/m, type II DM, HTN, HLD GI bleed in March 2018, esophageal adenocarcinoma, stage III chronic kidney disease, recent hospitalization 8/23-8/25 for decompensated CHF, presented to ED on 09/22/16 with worsening dyspnea, orthopnea and leg edema. He was admitted for acute on chronic diastolic CHF. Cardiology, nephrology (signed off) and palliative care consulted. On high-dose IV Lasix, no brisk diuresis, volume status steadily improving, ongoing issues with confusion/delirium. [facility] consulted for worsening LFTs.  Assessment/Plan: Principal Problem:   Acute on chronic respiratory failure with hypoxia (HCC) Active Problems:   Diabetes mellitus with stage 3 chronic kidney disease (HCC)   Persistent atrial fibrillation (HCC)   Acute on chronic diastolic congestive heart failure (HCC)   Anemia of chronic disease   Palliative care by specialist   DNR (do not resuscitate)   Acute renal failure ([place])   Atrial fibrillation with RVR (HCC)   Transient alteration of awareness  1. Acute on chronic combined systolic and diastolic CHF: Cardiology following. Despite IV Lasix 80 mg 3 times a day, has not diuresed well. As per nephrology, no good answer in this case, could try more aggressive diuretics but run the risk of worsening renal function. Palliative care discussing with family regarding goals of care. Echo this admission showed EF 35-40 percent with diffuse hypokinesis, reduced from 55-60 percent in April 2018. +160 mL since admission & weight up from 199 pounds on admission to 203, not sure if either are accurate. Creatinine stable in the 2.06-2.08 range over the last 2  days. Cardiology following and are considering adding metolazone. Still volume over loaded as evidenced by pitting sacral edema, trace leg edema and chest x-ray findings 9/18. Difficult situation. 2. Acute on chronic respiratory failure with hypoxia: Secondary to decompensated CHF. Management as above. Chest x-ray 9/18 reviewed and shows findings consistent with pulmonary edema. Wean oxygen as tolerated. 3. Persistent A. fib with RVR: Patient was on Toprol-XL 75 MG daily PTA. This had been reduced to 12.5 MG daily due to hypotension and then increased to 25 MG twice daily on 9/18 d/t periods of RVR in the 110s-120s and occasionally in the 140s. Not candidate for anticoagulation due to history of GI bleed. Better but still has RVR in the 120s mostly in the occasionally in the 140s. May have to increase Toprol-XL further, await cardiology input. 4. Acute on stage IV chronic kidney disease: Baseline creatinine appears to be close to 1.5. Nephrology input 9/16 appreciated and signed off. Not a candidate for dialysis. Palliative care following and communicating with family on a daily basis. Renal ultrasound without obstruction. Creatinine stable over the last 2 days. 5. CAD status post CABG/elevated troponin: No chest pain. Flat trend. Likely demand ischemia related to decompensated CHF, A. fib with RVR and acute on chronic hypoxic respiratory failure. 6. DM type II with renal complications: Good inpatient control on current dose of Lantus and SSI. Continue. A1c 7.2. 7. Anemia of chronic disease: Stable. 8. Esophageal adenocarcinoma: Patient had EGD 03/09/16 by Dr. [name] which showed adenocarcinoma. He was referred to [facility] for further intervention/treatment. Patient subsequently had EGD on 07/27/16 with Dr. Mortimer Fries and he had area ablated with spray cryotherapy using liquid nitrogen. Outpatient follow-up. [facility] input appreciated. 9. Dyslipidemia: Statins and fibroids held due to worsening  LFTs. 10. Hyponatremia: Stable. Likely hypervolemic hyponatremia. 11. GERD: 12. Leukocytosis: Unclear etiology. No overt clinical source of infection.? Stress response. Has been stable for the last couple of days. Blood cultures 2 from 9/18 pending. 13. Right shoulder pain/generalized pains: Ongoing for about 3 weeks and reports history of steroid injection. X-ray showed degenerative changes without acute findings. Patient reports pain in most of his extremity joints but detailed exam on 9/18 and 9/19 does not show any features for acute arthritis. CRP 18.2 and ESR 29. Check blood culture-pending. No NSAIDs due to acute kidney injury and esophageal cancer/GI bleed issues. Minimize acetaminophen due to abnormal LFTs. 14. Elevated uric acid: Do not believe his right shoulder pain is due to acute gout. May consider allopurinol if renal functions improved. 15. Adult failure to thrive: Multifactorial due to multiple severe significant comorbidities, advanced age and fragility. Poor overall prognosis. Palliative care input appreciated and they plan to follow-up with family today. Discussed with palliative care team on a daily basis. Also discussed in detail with patient's son on 9/18. 16. Transaminitis:? Passive congestion due to CHF versus other etiologies. Lipitor and fibrates stopped. RUQ ultrasound shows fatty liver without cholelithiasis or cholecystitis. Worsening LFTs. [facility] consulted. 17. Confusion/delirium: Likely multifactorial due to acute illness, acute kidney injury, pain complicating possible underlying dementia. Treat underlying cause, attempt adequate sleep hygiene (discussed with RN), minimize opioids. Ammonia normal on 9/17. DC'ed Benadryl bedtime. Has received IV Ativan for agitation over the last 2 nights which may not be helpful and may be worsening his mental status. CT head 8/23 did not show acute findings. Although low index of suspicion for acute intracranial process, we will repeat  CT head. May need to consider low-dose Haldol or Seroquel. Recheck Ammonia.  DVT prophylaxis: Lovenox Code Status: DNR Family Communication: Discussed in detail with his son via phone on 9/18, updated care and answered questions. Advised him off patient's critical condition and encouraged family to visit him if possible. Disposition Plan: not ready for discharge   Consultants:  Cardiology  Nephrology  Palliative care  [facility]  Procedures:  none  Antibiotics:  none  Subjective: Overnight events noted. Received dose of Ativan for agitation. Ongoing issues with confusion, agitation, intermittently yells out, has reported generalized aches and pains in the past but not today. Interviewed and examined patient this morning along with his RN. Somnolent but easily arousable, confused, oriented only to self.  Objective:  Vitals:   09/26/16 1228 09/26/16 1935 09/27/16 0611 09/27/16 0940  BP: 138/86 128/74 (!) 126/96 123/84  Pulse: (!) 120 (!) 132 (!) 122 (!) 107  Resp: '18 18 18   ' Temp: 98 F (36.7 C) 97.6 F (36.4 C) 97.6 F (36.4 C)   TempSrc: Oral Oral Oral   SpO2: 94% 96% 99%   Weight:   92.4 kg (203 lb 12.8 oz)   Height:        :  Intake/Output Summary (Last 24 hours) at 09/27/16 1126 Last data filed at 09/27/16 4098  Gross per 24 hour  Intake              660 ml  Output              200 ml  Net              460 ml   Filed Weights   09/25/16 0347 09/26/16 0416 09/27/16 0611  Weight: 91.1 kg (  200 lb 12.8 oz) 91.8 kg (202 lb 6.4 oz) 92.4 kg (203 lb 12.8 oz)    Exam:    General:  Elderly male, moderately built and nourished, chronically ill-looking, lying comfortably propped up in bed.   Cardiovascular: S1 and S2 heard, irregularly irregular. No JVD or murmurs. Trace bilateral ankle edema. Telemetry: A. fib with ventricular rate mostly in the 120s, better than 140s he had yesterday.  Respiratory: Diminished breath sounds in the bases with occasional  bibasal crackles. Rest of lung fields clear to auscultation. No increased work of breathing. Stable.  Abdomen: Soft/ND/NT, positive BS. Stable  Musculoskeletal: Trace bilateral ankle edema, 1+ pitting sacral edema. No acute findings in large or small joints of his extremities upon reexamination today. Moves all extremities symmetrically and well.  Neuro: Mental status as indicated above. No focal neurological deficits. Neck supple without stiffness.  Psychiatry: Insight impaired.   Data Reviewed: Basic Metabolic Panel:  Recent Labs Lab 09/21/16 1823  09/23/16 0604 09/24/16 0426 09/25/16 0448 09/26/16 0527 09/27/16 0409  NA 133*  < > 130* 128* 131* 130* 129*  K 4.3  < > 4.3 4.6 4.7 4.5 5.1  CL 96*  < > 93* 90* 93* 92* 89*  CO2 27  < > '23 28 27 26 26  ' GLUCOSE 115*  < > 126* 151* 142* 111* 73  BUN 43*  < > 70* 82* 89* 90* 100*  CREATININE 1.83*  < > 2.63* 2.45* 2.41* 2.06* 2.08*  CALCIUM 8.4*  < > 8.3* 8.1* 8.3* 8.3* 8.5*  MG 1.9  --  2.3  --   --   --   --   < > = values in this interval not displayed. Liver Function Tests:  Recent Labs Lab 09/25/16 0448 09/26/16 0527 09/27/16 0409  AST 564* 579* 722*  ALT 380* 386* 466*  ALKPHOS 125 135* 140*  BILITOT 1.4* 1.7* 1.8*  PROT 6.1* 6.5 6.6  ALBUMIN 2.7* 2.6* 2.7*   No results for input(s): LIPASE, AMYLASE in the last 168 hours.  Recent Labs Lab 09/25/16 0448  AMMONIA 31   CBC:  Recent Labs Lab 09/22/16 0728 09/23/16 0604 09/25/16 0448 09/26/16 0527 09/27/16 0409  WBC 21.6* 17.6* 18.1* 18.0* 18.4*  NEUTROABS 18.6*  --   --   --   --   HGB 8.7* 9.2* 9.4* 9.4* 9.7*  HCT 28.6* 30.2* 30.8* 30.8* 31.4*  MCV 86.4 86.5 85.3 84.4 84.9  PLT 357 380 368 399 431*   Cardiac Enzymes:    Recent Labs Lab 09/22/16 0241 09/22/16 0728 09/22/16 1406  TROPONINI 0.21* 0.20* 0.21*   BNP (last 3 results)  Recent Labs  08/18/16 1058 08/31/16 1314 09/21/16 1823  BNP 1,191.6* 2,085.6* 1,875.7*    ProBNP (last 3  results) No results for input(s): PROBNP in the last 8760 hours.  CBG:  Recent Labs Lab 09/26/16 0742 09/26/16 1116 09/26/16 1640 09/26/16 2100 09/27/16 0749  GLUCAP 104* 109* 125* 124* 73    No results found for this or any previous visit (from the past 240 hour(s)).   Studies: Dg Chest Port 1 View  Result Date: 09/26/2016 CLINICAL DATA:  Congestive heart failure, followup EXAM: PORTABLE CHEST 1 VIEW COMPARISON:  Portable chest x-ray of 09/24/2016 and CT chest of 02/21/2016 FINDINGS: The lungs are not optimally aerated. Therefore there is some volume loss at the lung bases. There does appear to be a left pleural effusion and possibly a small right pleural effusion as well. Cardiomegaly is stable  and there may be mild pulmonary vascular congestion suggesting mild CHF. Median sternotomy sutures are noted from prior CABG. No acute bony abnormality is seen. IMPRESSION: Probable mild CHF with cardiomegaly, small effusions, and pulmonary vascular congestion. Electronically Signed   By: Ivar Drape M.D.   On: 09/26/2016 14:52   US Abdomen Limited Ruq  Result Date: 09/26/2016 CLINICAL DATA:  Congestive heart failure. EXAM: ULTRASOUND ABDOMEN LIMITED RIGHT UPPER QUADRANT COMPARISON:  Ultrasound kidneys 09/22/2016. CT abdomen and pelvis 02/21/2016 FINDINGS: Gallbladder: No gallstones or wall thickening visualized. No sonographic Murphy sign noted by sonographer. Common bile duct: Diameter: 3 mm, normal Liver: Diffusely increased hepatic parenchymal echotexture suggesting fatty infiltration. No focal lesions identified. Portal vein is patent on color Doppler imaging with normal direction of blood flow towards the liver. IMPRESSION: Diffuse fatty infiltration of the liver. No evidence of cholelithiasis or cholecystitis. Electronically Signed   By: Lucienne Capers M.D.   On: 09/26/2016 21:15    Scheduled Meds: . enoxaparin (LOVENOX) injection  30 mg Subcutaneous Daily  . furosemide  80 mg  Intravenous TID  . insulin aspart  0-9 Units Subcutaneous TID WC  . insulin glargine  30 Units Subcutaneous QHS  . metoprolol succinate  25 mg Oral BID  . pantoprazole  40 mg Oral Q0600  . sodium chloride flush  3 mL Intravenous Q12H  . traZODone  100 mg Oral QHS  . vitamin C  500 mg Oral Daily    Continuous Infusions: . sodium chloride       Time spent: 35 mins   Keyaira Clapham, MD, FACP, FHM. [facility] Pager [phone]  If 7PM-7AM, please contact night-coverage [email or web address] Password [email or web address] 09/27/2016, 11:26 AM

## 2016-09-28 DIAGNOSIS — R7989 Other specified abnormal findings of blood chemistry: Secondary | ICD-10-CM

## 2016-09-28 DIAGNOSIS — R945 Abnormal results of liver function studies: Secondary | ICD-10-CM

## 2016-09-28 LAB — COMPREHENSIVE METABOLIC PANEL
ALT: 545 U/L — ABNORMAL HIGH (ref 17–63)
AST: 806 U/L — AB (ref 15–41)
Albumin: 2.7 g/dL — ABNORMAL LOW (ref 3.5–5.0)
Alkaline Phosphatase: 148 U/L — ABNORMAL HIGH (ref 38–126)
Anion gap: 12 (ref 5–15)
BILIRUBIN TOTAL: 1.8 mg/dL — AB (ref 0.3–1.2)
BUN: 109 mg/dL — AB (ref 6–20)
CO2: 27 mmol/L (ref 22–32)
Calcium: 8.5 mg/dL — ABNORMAL LOW (ref 8.9–10.3)
Chloride: 91 mmol/L — ABNORMAL LOW (ref 101–111)
Creatinine, Ser: 2.02 mg/dL — ABNORMAL HIGH (ref 0.61–1.24)
GFR, EST AFRICAN AMERICAN: 34 mL/min — AB (ref >60)
GFR, EST NON AFRICAN AMERICAN: 29 mL/min — AB (ref >60)
Glucose, Bld: 89 mg/dL (ref 65–99)
POTASSIUM: 4.8 mmol/L (ref 3.5–5.1)
Sodium: 130 mmol/L — ABNORMAL LOW (ref 135–145)
TOTAL PROTEIN: 6.4 g/dL — AB (ref 6.5–8.1)

## 2016-09-28 LAB — GLUCOSE, CAPILLARY
GLUCOSE-CAPILLARY: 88 mg/dL (ref 65–99)
GLUCOSE-CAPILLARY: 96 mg/dL (ref 65–99)
GLUCOSE-CAPILLARY: 94 mg/dL (ref 65–99)
GLUCOSE-CAPILLARY: 106 mg/dL — AB (ref 65–99)
Glucose-Capillary: 99 mg/dL (ref 65–99)
Glucose-Capillary: 74 mg/dL (ref 65–99)

## 2016-09-28 LAB — PROTIME-INR
INR: 2.01
PROTHROMBIN TIME: 22.6 s — AB (ref 11.4–15.2)

## 2016-09-28 MED ORDER — ENOXAPARIN SODIUM 40 MG/0.4ML ~~LOC~~ SOLN
40.0000 mg | Freq: Every day | SUBCUTANEOUS | Status: DC
  Administered 2016-09-29 – 2016-10-05 (×7): 40 mg via SUBCUTANEOUS
  Filled 2016-09-28 (×7): qty 0.4

## 2016-09-28 MED ORDER — INSULIN GLARGINE 100 UNIT/ML ~~LOC~~ SOLN: 20.0000 [IU] | Freq: Every day | SUBCUTANEOUS | Status: DC

## 2016-09-28 MED ORDER — INSULIN GLARGINE 100 UNIT/ML ~~LOC~~ SOLN
15.0000 [IU] | Freq: Every day | SUBCUTANEOUS | Status: DC
  Filled 2016-09-28 (×2): qty 0.15

## 2016-09-28 NOTE — Progress Notes (Signed)
PROGRESS NOTE  Mark Rivers JQB:[id number] DOB: July 06, 1934 DOA: 09/21/2016 PCP: Mark Peers, MD  Brief narrative: 82 year old male with PMH of persistent A. fib poor candidate for anticoagulation due to bleeding risks, CAD, CVA, chronic diastolic CHF with last EF 55-60 percent, chronic respiratory failure on home oxygen 2-3 L/m, type II DM, HTN, HLD Rivers bleed in March 2018, esophageal adenocarcinoma, stage III chronic kidney disease, recent hospitalization 8/23-8/25 for decompensated CHF, presented to ED on 09/22/16 with worsening dyspnea, orthopnea and leg edema. He was admitted for acute on chronic diastolic CHF. Cardiology, nephrology (signed off) and palliative care consulted. On high-dose IV Lasix, no brisk diuresis, volume status steadily improving, ongoing issues with confusion/delirium. Mark Rivers consulted for worsening LFTs. A. fib rate control improving. Mental status may be slightly better on 9/20.  Assessment/Plan: Principal Problem:   Acute on chronic respiratory failure with hypoxia (HCC) Active Problems:   Diabetes mellitus with stage 3 chronic kidney disease (HCC)   Persistent atrial fibrillation (HCC)   Acute on chronic diastolic congestive heart failure (HCC)   Anemia of chronic disease   Palliative care by specialist   DNR (do not resuscitate)   Acute renal failure ([place])   Atrial fibrillation with RVR (HCC)   Transient alteration of awareness  1. Acute on chronic combined systolic and diastolic CHF: Cardiology following. Patient has been on IV Lasix 80 mg 3 times a day for several days. Only -324 mL documented since admission, not sure if this is accurate but in any event not having brisk diuresis. Weight recordings also don't seem accurate, presented with 199 pounds and now at 200 pounds. Creatinine holding stable over the last 3 days. Nephrology had consulted earlier on and indicated that there was no good answer in this case, could try more aggressive diuretics but  risk of worsening renal functions. Palliative care very involved and coordinating with family regarding goals of care. Echo this admission showed EF 35-40 percent with diffuse hypokinesis, reduced from 55-60 percent in April 2018. Still volume over loaded as evidenced by pitting sacral edema, trace leg edema and chest x-ray findings 9/18. Difficult situation. 2. Acute on chronic respiratory failure with hypoxia: Secondary to decompensated CHF. Management as above. Chest x-ray 9/18 reviewed and shows findings consistent with pulmonary edema. Wean oxygen as tolerated. 3. Persistent A. fib with RVR: Patient was on Toprol-XL 75 MG daily PTA. This had been reduced to 12.5 MG daily due to hypotension and an now being gradually titrated up, 37.5 MG bid and rate is better controlled. Not candidate for anticoagulation due to history of Rivers bleed. Cardiology following. 4. Acute on stage IV chronic kidney disease: Baseline creatinine appears to be close to 1.5. Nephrology input 9/16 appreciated and signed off. Not a candidate for dialysis. Palliative care following and communicating with family on a daily basis. Renal ultrasound without obstruction. Creatinine has been stable in the low 2 range for the last 3 days. 5. CAD status post CABG/elevated troponin: No chest pain. Flat trend. Likely demand ischemia related to decompensated CHF, A. fib with RVR and acute on chronic hypoxic respiratory failure. 6. DM type II with renal complications: I0X 7.2. Too tight CBG control and risk of hypoglycemia especially reported that patient not eating much. Reduce Lantus. Continue SSI. Monitor closely. 7. Anemia of chronic disease: Stable. 8. Esophageal adenocarcinoma: Patient had EGD 03/09/16 by Dr. [name] which showed adenocarcinoma. He was referred to Mark Rivers for further intervention/treatment. Patient subsequently had EGD on 07/27/16 with Dr. Hart Rivers  Mark Rivers and he had area ablated with spray cryotherapy using liquid nitrogen.  Outpatient follow-up. Mark Rivers input appreciated. 9. Dyslipidemia: Statins and fibroids held due to worsening LFTs. 10. Hyponatremia: Stable. Likely hypervolemic hyponatremia. 11. GERD: 12. Leukocytosis: Unclear etiology. No overt clinical source of infection.? Stress response. Has been stable for the last couple of days. Blood cultures 2 from 9/18, negative to date. 13. Right shoulder pain/generalized pains: Ongoing for about 3 weeks and reports history of steroid injection. X-ray showed degenerative changes without acute findings. Patient reports pain in most of his extremity joints but detailed exam on 9/18 and 9/19 did not show any features for acute arthritis. CRP 18.2 and ESR 29. Check blood culture-negative to date. No NSAIDs due to acute kidney injury and esophageal cancer/Rivers bleed issues. Minimize acetaminophen due to abnormal LFTs. 14. Elevated uric acid: Do not believe his right shoulder pain is due to acute gout. May consider allopurinol if renal functions improved. 15. Adult failure to thrive: Multifactorial due to multiple severe significant comorbidities, advanced age and fragility. Poor overall prognosis. Palliative care input appreciated and they are closely coordinating with family. Discussed with Mark Rivers with Palliative. Patient's family is supposed to arrive in town later this evening. 16. Transaminitis:? Passive congestion due to CHF versus other etiologies. Lipitor and fibrates stopped. RUQ ultrasound shows fatty liver without cholelithiasis or cholecystitis. Worsening LFTs. [place] consulted and feels this is due to passive congestion from CHF and recommend observation at this time. 17. Confusion/delirium: Likely multifactorial due to acute illness, acute kidney injury, pain complicating possible underlying dementia. Treat underlying cause, attempt adequate sleep hygiene (discussed with RN), minimize opioids. Ammonia normal on 9/17. DC'ed Benadryl bedtime. Avoid benzos.  CT head 9/19 without acute findings. Repeat ammonia level also normal. As per discussion with Mark Rivers, family very reluctant to consider starting antipsychotics. Mental status seems better today. Has not received Haldol, DC'd 18. Hyponatremia: Stable and not severe enough to explain mental status changes.  DVT prophylaxis: Lovenox Code Status: DNR Family Communication: Discussed in detail with his son via phone on 9/18, updated care and answered questions. Advised him off patient's critical condition and encouraged family to visit him if possible.  Disposition Plan: not ready for discharge   Consultants:  Cardiology  Nephrology  Palliative care  [place]  Procedures:  none  Antibiotics:  none  Subjective:  Patient was interviewed and examined in the presence of his RN this morning. Looks better. Alert, oriented to self and place, unable to say why he intermittently yells out, reports generalized pain with no specific location worse, states that he is "frustrated" but will not elaborate. Able to say that his daughter lives in [place].  Objective:  Vitals:   09/27/16 1300 09/27/16 1955 09/28/16 0535 09/28/16 1210  BP: (!) 103/55 109/72 111/70 119/63  Pulse: (!) 111 97 100 (!) 105  Resp: _0 Temp: (!) 97.5 F (36.4 C) 97.8 F (36.6 C) 97.9 F (36.6 C) 98 F (36.7 C)  TempSrc: Oral Oral Oral Oral  SpO2: 98% 99% 94% 94%  Weight:   91.1 kg (200 lb 14.4 oz)   Height:        :  Intake/Output Summary (Last 24 hours) at 09/28/16 1350 Last data filed at 09/28/16 1208  Gross per 24 hour  Intake              476 ml  Output  1200 ml  Net             -724 ml   Filed Weights   09/26/16 0416 09/27/16 0611 09/28/16 0535  Weight: 91.8 kg (202 lb 6.4 oz) 92.4 kg (203 lb 12.8 oz) 91.1 kg (200 lb 14.4 oz)    Exam:    General:  Elderly male, moderately built and nourished, chronically ill-looking, lying comfortably propped up in bed. Looks  better than he did over the last couple of days.  Cardiovascular: S1 and S2 heard, irregularly irregular. No JVD or murmurs. Trace bilateral ankle edema. Telemetry: A. fib with ventricular rate mostly in the 80s-90s, better than last couple days.  Respiratory: Occasional basal crackles but otherwise clear to auscultation. No increased work of breathing.  Abdomen: Soft/ND/NT, positive BS. Stable  Musculoskeletal: Trace bilateral ankle edema, 1+ pitting sacral edema. No acute findings in large or small joints of his extremities upon reexamination today. Moves all extremities symmetrically and well.  Neuro: Mental status as indicated above. No focal neurological deficits. Neck supple without stiffness.  Psychiatry: Insight impaired.   Data Reviewed: Basic Metabolic Panel:  Recent Labs Lab 09/21/16 1823  09/23/16 0604 09/24/16 1829 09/25/16 0448 09/26/16 0527 09/27/16 0409 09/28/16 0417  NA 133*  < > 130* 128* 131* 130* 129* 130*  K 4.3  < > 4.3 4.6 4.7 4.5 5.1 4.8  CL 96*  < > 93* 90* 93* 92* 89* 91*  CO2 27  < > _0 GLUCOSE 115*  < > 126* 151* 142* 111* 73 89  BUN 43*  < > 70* 82* 89* 90* 100* 109*  CREATININE 1.83*  < > 2.63* 2.45* 2.41* 2.06* 2.08* 2.02*  CALCIUM 8.4*  < > 8.3* 8.1* 8.3* 8.3* 8.5* 8.5*  MG 1.9  --  2.3  --   --   --   --   --   < > = values in this interval not displayed. Liver Function Tests:  Recent Labs Lab 09/25/16 0448 09/26/16 0527 09/27/16 0409 09/28/16 0417  AST 564* 579* 722* 806*  ALT 380* 386* 466* 545*  ALKPHOS 125 135* 140* 148*  BILITOT 1.4* 1.7* 1.8* 1.8*  PROT 6.1* 6.5 6.6 6.4*  ALBUMIN 2.7* 2.6* 2.7* 2.7*   No results for input(s): LIPASE, AMYLASE in the last 168 hours.  Recent Labs Lab 09/25/16 0448 09/27/16 1150  AMMONIA 31 34   CBC:  Recent Labs Lab 09/22/16 0728 09/23/16 0604 09/25/16 0448 09/26/16 0527 09/27/16 0409  WBC 21.6* 17.6* 18.1* 18.0* 18.4*  NEUTROABS 18.6*  --   --   --   --   HGB  8.7* 9.2* 9.4* 9.4* 9.7*  HCT 28.6* 30.2* 30.8* 30.8* 31.4*  MCV 86.4 86.5 85.3 84.4 84.9  PLT 357 380 368 399 431*   Cardiac Enzymes:    Recent Labs Lab 09/22/16 0241 09/22/16 0728 09/22/16 1406  TROPONINI 0.21* 0.20* 0.21*   BNP (last 3 results)  Recent Labs  08/18/16 1058 08/31/16 1314 09/21/16 1823  BNP 1,191.6* 2,085.6* 1,875.7*    ProBNP (last 3 results) No results for input(s): PROBNP in the last 8760 hours.  CBG:  Recent Labs Lab 09/27/16 1652 09/27/16 2149 09/28/16 0533 09/28/16 0801 09/28/16 1158  GLUCAP 76 76 88 96 94    Recent Results (from the past 240 hour(s))  Culture, blood (Routine X 2) w Reflex to ID Panel     Status: None (Preliminary result)   Collection Time: 09/26/16  3:33 PM  Result Value Ref Range Status   Specimen Description BLOOD LEFT ARM  Final   Special Requests IN PEDIATRIC BOTTLE Blood Culture adequate volume  Final   Culture NO GROWTH < 24 HOURS  Final   Report Status PENDING  Incomplete  Culture, blood (Routine X 2) w Reflex to ID Panel     Status: None (Preliminary result)   Collection Time: 09/26/16  3:36 PM  Result Value Ref Range Status   Specimen Description BLOOD RIGHT ARM  Final   Special Requests IN PEDIATRIC BOTTLE Blood Culture adequate volume  Final   Culture NO GROWTH < 24 HOURS  Final   Report Status PENDING  Incomplete     Studies: Ct Head Wo Contrast  Result Date: 09/27/2016 CLINICAL DATA:  Acute confusion EXAM: CT HEAD WITHOUT CONTRAST TECHNIQUE: Contiguous axial images were obtained from the base of the skull through the vertex without intravenous contrast. COMPARISON:  08/31/2016 FINDINGS: Brain: Diffuse atrophic changes and chronic white matter ischemic changes are noted. No findings to suggest acute hemorrhage, acute infarction or space-occupying mass lesion are seen. Basal ganglia calcifications are noted bilaterally. Vascular: No hyperdense vessel or unexpected calcification. Skull: Normal. Negative for  fracture or focal lesion. Sinuses/Orbits: No acute finding. Other: None. IMPRESSION: Chronic atrophic and ischemic changes stable from the prior exam. No acute abnormality noted. Electronically Signed   By: Inez Catalina M.D.   On: 09/27/2016 14:04    Scheduled Meds: . enoxaparin (LOVENOX) injection  30 mg Subcutaneous Daily  . furosemide  80 mg Intravenous TID  . insulin aspart  0-9 Units Subcutaneous TID WC  . insulin glargine  20 Units Subcutaneous QHS  . metoprolol succinate  37.5 mg Oral BID  . pantoprazole  40 mg Oral Q0600  . sodium chloride flush  3 mL Intravenous Q12H  . traZODone  100 mg Oral QHS  . vitamin C  500 mg Oral Daily    Continuous Infusions: . sodium chloride       Time spent: 35 mins   Bonna Steury, MD, FACP, FHM. [facility] Pager [phone]  If 7PM-7AM, please contact night-coverage [email or web address] Password [email or web address] 09/28/2016, 1:50 PM

## 2016-09-28 NOTE — Progress Notes (Signed)
Daily Rounding Note  09/28/2016, 8:18 AM  LOS: 6 days   SUBJECTIVE:   Chief complaint: Intermittent agitation, confusion continue.  Moans but not able to tell me why.       Denies abdominal pain.   Denies ETOH consumption (alcohol, beer and wine)  OBJECTIVE:         Vital signs in last 24 hours:    Temp:  [97.5 F (36.4 C)-97.9 F (36.6 C)] 97.9 F (36.6 C) (09/20 0535) Pulse Rate:  [97-111] 100 (09/20 0535) Resp:  [18] 18 (09/20 0535) BP: (103-123)/(55-84) 111/70 (09/20 0535) SpO2:  [94 %-99 %] 94 % (09/20 0535) Weight:  [91.1 kg (200 lb 14.4 oz)] 91.1 kg (200 lb 14.4 oz) (09/20 0535) Last BM Date: 09/25/16 Filed Weights   09/26/16 0416 09/27/16 0611 09/28/16 0535  Weight: 91.8 kg (202 lb 6.4 oz) 92.4 kg (203 lb 12.8 oz) 91.1 kg (200 lb 14.4 oz)   General: looks unwell.  Moaning.     Heart: irreg, irreg Chest: some fine rales in right base Abdomen: soft, NT, ND.  Obese.  BS active, no tinkling or tympanitic sounds  Extremities: slight LE edema Neuro/Psych:  Safety mittens in place. Moans periodically.  When asked year, place, [place] president, he answers correctly.  He follows commands.   Moves all 4 limbs.    Intake/Output from previous day: 09/19 0701 - 09/20 0700 In: 716 [P.O.:716] Out: 450 [Urine:450]  Intake/Output this shift: No intake/output data recorded.  Lab Results:  Recent Labs  09/26/16 0527 09/27/16 0409  WBC 18.0* 18.4*  HGB 9.4* 9.7*  HCT 30.8* 31.4*  PLT 399 431*   BMET  Recent Labs  09/26/16 0527 09/27/16 0409 09/28/16 0417  NA 130* 129* 130*  K 4.5 5.1 4.8  CL 92* 89* 91*  CO2 _0 GLUCOSE 111* 73 89  BUN 90* 100* 109*  CREATININE 2.06* 2.08* 2.02*  CALCIUM 8.3* 8.5* 8.5*   LFT  Recent Labs  09/26/16 0527 09/27/16 0409 09/28/16 0417  PROT 6.5 6.6 6.4*  ALBUMIN 2.6* 2.7* 2.7*  AST 579* 722* 806*  ALT 386* 466* 545*  ALKPHOS 135* 140* 148*  BILITOT 1.7* 1.8*  1.8*   PT/INR  Recent Labs  09/28/16 0417  LABPROT 22.6*  INR 2.01   Hepatitis Panel No results for input(s): HEPBSAG, HCVAB, HEPAIGM, HEPBIGM in the last 72 hours.  Studies/Results: Ct Head Wo Contrast  Result Date: 09/27/2016 CLINICAL DATA:  Acute confusion EXAM: CT HEAD WITHOUT CONTRAST TECHNIQUE: Contiguous axial images were obtained from the base of the skull through the vertex without intravenous contrast. COMPARISON:  08/31/2016 FINDINGS: Brain: Diffuse atrophic changes and chronic white matter ischemic changes are noted. No findings to suggest acute hemorrhage, acute infarction or space-occupying mass lesion are seen. Basal ganglia calcifications are noted bilaterally. Vascular: No hyperdense vessel or unexpected calcification. Skull: Normal. Negative for fracture or focal lesion. Sinuses/Orbits: No acute finding. Other: None. IMPRESSION: Chronic atrophic and ischemic changes stable from the prior exam. No acute abnormality noted. Electronically Signed   By: Inez Catalina M.D.   On: 09/27/2016 14:04   Dg Chest Port 1 View  Result Date: 09/26/2016 CLINICAL DATA:  Congestive heart failure, followup EXAM: PORTABLE CHEST 1 VIEW COMPARISON:  Portable chest x-ray of 09/24/2016 and CT chest of 02/21/2016 FINDINGS: The lungs are not optimally aerated. Therefore there is some volume loss at the lung bases. There does appear to be a  left pleural effusion and possibly a small right pleural effusion as well. Cardiomegaly is stable and there may be mild pulmonary vascular congestion suggesting mild CHF. Median sternotomy sutures are noted from prior CABG. No acute bony abnormality is seen. IMPRESSION: Probable mild CHF with cardiomegaly, small effusions, and pulmonary vascular congestion. Electronically Signed   By: Ivar Drape M.D.   On: 09/26/2016 14:52   US Abdomen Limited Ruq  Result Date: 09/26/2016 CLINICAL DATA:  Congestive heart failure. EXAM: ULTRASOUND ABDOMEN LIMITED RIGHT UPPER QUADRANT  COMPARISON:  Ultrasound kidneys 09/22/2016. CT abdomen and pelvis 02/21/2016 FINDINGS: Gallbladder: No gallstones or wall thickening visualized. No sonographic Murphy sign noted by sonographer. Common bile duct: Diameter: 3 mm, normal Liver: Diffusely increased hepatic parenchymal echotexture suggesting fatty infiltration. No focal lesions identified. Portal vein is patent on color Doppler imaging with normal direction of blood flow towards the liver. IMPRESSION: Diffuse fatty infiltration of the liver. No evidence of cholelithiasis or cholecystitis. Electronically Signed   By: Lucienne Capers M.D.   On: 09/26/2016 21:15   Scheduled Meds: . enoxaparin (LOVENOX) injection  30 mg Subcutaneous Daily  . furosemide  80 mg Intravenous TID  . insulin aspart  0-9 Units Subcutaneous TID WC  . insulin glargine  20 Units Subcutaneous QHS  . metoprolol succinate  37.5 mg Oral BID  . pantoprazole  40 mg Oral Q0600  . sodium chloride flush  3 mL Intravenous Q12H  . traZODone  100 mg Oral QHS  . vitamin C  500 mg Oral Daily   Continuous Infusions: . sodium chloride     PRN Meds:.sodium chloride, acetaminophen, guaiFENesin-dextromethorphan, haloperidol lactate, metoprolol tartrate, ondansetron (ZOFRAN) IV, sodium chloride flush, traMADol   ASSESMENT:   *   Rising LFTs, primarily transaminases and Alk Phos.  Suspect due to passive congestion from heart failure.  Extent of ETOH intake unknown, prior records say no ETOH after 2006.  Fatty liver, o/w unremarkable abdominal ultrasound 9/18.  No suspect meds, Lipitor discontinued, last dose 9/17.      Elevated PT, normal INR.  Acute hepatitis panel processing.    *  Esophageal adenocarcinoma.  Dx 02/2016.  EMR 05/2016.  07/2016 EGD with no visible recurrence, Dr Adria Devon treated area of residual nodularitywith cryoablation.  Due repeat EGD this month, not clear this has been arranged.   *  CHF, acute on chronic systolic/diastolic.  Atrial fibrillation with rate  not well controlled, Metoprolol dose increased 9/19.  09/23/16 echo: EF 35 to 40%, unable to assess diastolic function, Aortic valve calcification and mild to moderate regurge, mod to severe MV regurge, moderate tricuspid regurge, dilated right atrium.    *  AKI.    *  Intermittent confusion, normal ammonia level.   CT head 08/31/16 post fall/trauma: atrophy, white matter ischemia.  CT 9/19: no change.  Palliative care following and making suggestions.       *  Normocytic anemia.  Stable.      PLAN   *  Observation, daily LFTs and coags.  Await acute hepatitis serologies.    Azucena Freed  09/28/2016, 8:18 AM Pager: [phone]  GI ATTENDING  Interval history data reviewed. Patient seen and examined. Agree with interval progress note. Liver test abnormalities persist without significant overall change. Working diagnosis remains hepatic congestion from heart failure with reactive hepatopathy. No obvious culprit to implicate drug reaction. Hepatitis serologies pending. Continue to monitor LFTs. We'll continue to follow.  Docia Chuck. Geri [place]in[place]., M.D. [facility] Division of Gastroenterology

## 2016-09-28 NOTE — Progress Notes (Signed)
Rate control improved somewhat - ok to titrate BB to keep HR <110 as BP allows. Severe MR noted on echo - not a candidate for MV therapies. Weight down an additional 3 lbs with TID lasix. Creatinine improving, therefore would continue. Agree with palliative care involvement given adenocarcinoma and overall clinical picture of decline.  Pixie Casino, MD, [place]  Attending Cardiologist  Direct Dial: [phone]  Fax: [phone]  Website:  [email or web address]

## 2016-09-29 DIAGNOSIS — Z7189 Other specified counseling: Secondary | ICD-10-CM

## 2016-09-29 DIAGNOSIS — I509 Heart failure, unspecified: Secondary | ICD-10-CM

## 2016-09-29 LAB — GLUCOSE, CAPILLARY
GLUCOSE-CAPILLARY: 133 mg/dL — AB (ref 65–99)
GLUCOSE-CAPILLARY: 158 mg/dL — AB (ref 65–99)
GLUCOSE-CAPILLARY: 170 mg/dL — AB (ref 65–99)
Glucose-Capillary: 80 mg/dL (ref 65–99)

## 2016-09-29 LAB — CBC
HEMATOCRIT: 32.7 % — AB (ref 39.0–52.0)
HEMOGLOBIN: 9.9 g/dL — AB (ref 13.0–17.0)
MCH: 25.8 pg — AB (ref 26.0–34.0)
MCHC: 30.3 g/dL (ref 30.0–36.0)
MCV: 85.2 fL (ref 78.0–100.0)
PLATELETS: 435 10*3/uL — AB (ref 150–400)
RBC: 3.84 MIL/uL — AB (ref 4.22–5.81)
RDW: 17.2 % — ABNORMAL HIGH (ref 11.5–15.5)
WBC: 15 10*3/uL — AB (ref 4.0–10.5)

## 2016-09-29 LAB — COMPREHENSIVE METABOLIC PANEL
ALBUMIN: 2.5 g/dL — AB (ref 3.5–5.0)
ALT: 447 U/L — ABNORMAL HIGH (ref 17–63)
ANION GAP: 9 (ref 5–15)
AST: 547 U/L — AB (ref 15–41)
Alkaline Phosphatase: 127 U/L — ABNORMAL HIGH (ref 38–126)
BILIRUBIN TOTAL: 1.5 mg/dL — AB (ref 0.3–1.2)
BUN: 102 mg/dL — AB (ref 6–20)
CO2: 32 mmol/L (ref 22–32)
Calcium: 8.5 mg/dL — ABNORMAL LOW (ref 8.9–10.3)
Chloride: 94 mmol/L — ABNORMAL LOW (ref 101–111)
Creatinine, Ser: 1.73 mg/dL — ABNORMAL HIGH (ref 0.61–1.24)
GFR calc Af Amer: 41 mL/min — ABNORMAL LOW (ref >60)
GFR calc non Af Amer: 36 mL/min — ABNORMAL LOW (ref >60)
GLUCOSE: 84 mg/dL (ref 65–99)
POTASSIUM: 4 mmol/L (ref 3.5–5.1)
SODIUM: 135 mmol/L (ref 135–145)
Total Protein: 5.9 g/dL — ABNORMAL LOW (ref 6.5–8.1)

## 2016-09-29 LAB — PROTIME-INR
INR: 1.85
Prothrombin Time: 21.2 seconds — ABNORMAL HIGH (ref 11.4–15.2)

## 2016-09-29 LAB — HEPATITIS PANEL, ACUTE
HEP A IGM: NEGATIVE
Hep B C IgM: NEGATIVE
Hepatitis B Surface Ag: NEGATIVE

## 2016-09-29 MED ORDER — INSULIN GLARGINE 100 UNIT/ML ~~LOC~~ SOLN
8.0000 [IU] | Freq: Every day | SUBCUTANEOUS | Status: DC
  Administered 2016-09-29 – 2016-10-04 (×6): 8 [IU] via SUBCUTANEOUS
  Filled 2016-09-29 (×7): qty 0.08

## 2016-09-29 NOTE — Progress Notes (Signed)
Daily Progress Note   Patient Name: Mark Rivers       Date: 09/29/2016 DOB: [birth year]  Age: 82 y.o. MRN#: [id number] Attending Physician: Tawni Millers Primary Care Physician: [name], MD Admit Date: 09/21/2016  Reason for Consultation/Follow-up: Establishing goals of care  Subjective: Met with patient's daughter, Rod Holler, at beside. Reviewed patient's current status with her. Mental status appears to be improving slightly per chart review- however, upon waking- patient unable to identify daughter by name. He sighs and calls out. Says he doesn't feel well.  Per Cardiology note- HR is improving and he is continuing to diurese, Cr is trending down. Discussed overall poor clinical picture with Rod Holler and patient's overall downward trajectory over last year. Rod Holler became tearful during our discussion and reports that since patient's spouse died in 2022/02/11 she has noticed he has declined quite quickly.  Rod Holler notes that her "pie in the sky" goal would be to transport patient to [place] for rehab. Though she acknowledges significant barriers at this time to transporting him there. He is not medically stable to transport via private vehicle.  We discussed overall expected trajectory of CHF and CKD with worsening exacerbations.   Rod Holler notes that patient was able to get up in chair with PT today. She is hopeful he will be able to try and walk with a walker over the weekend.  Plan for now is to monitor over weekend and reevaluate [place] on Monday.   ROS  Length of Stay: 7  Current Medications: Scheduled Meds:  . enoxaparin (LOVENOX) injection  40 mg Subcutaneous Daily  . furosemide  80 mg Intravenous TID  . insulin aspart  0-9 Units Subcutaneous TID WC  . insulin glargine  15  Units Subcutaneous QHS  . metoprolol succinate  37.5 mg Oral BID  . pantoprazole  40 mg Oral Q0600  . traZODone  100 mg Oral QHS  . vitamin C  500 mg Oral Daily    Continuous Infusions:   PRN Meds: acetaminophen, guaiFENesin-dextromethorphan, metoprolol tartrate, ondansetron (ZOFRAN) IV, traMADol  Physical Exam  Constitutional: He appears well-nourished. No distress.  Cardiovascular: Normal rate.   irregular  Pulmonary/Chest: He has wheezes.  Neurological:  Arouses, not oriented to person   Skin: Skin is warm and dry.  Nursing note and vitals reviewed.  Vital Signs: BP 125/81 (BP Location: Left Arm)   Pulse (!) 108   Temp 97.8 F (36.6 C) (Oral)   Resp 18   Ht '5\' 7"'  (1.702 m)   Wt 87.5 kg (192 lb 14.4 oz)   SpO2 94%   BMI 30.21 kg/m  SpO2: SpO2: 94 % O2 Device: O2 Device: Nasal Cannula O2 Flow Rate: O2 Flow Rate (L/min): 6 L/min  Intake/output summary:  Intake/Output Summary (Last 24 hours) at 09/29/16 1651 Last data filed at 09/29/16 1522  Gross per 24 hour  Intake              570 ml  Output             2475 ml  Net            -1905 ml   LBM: Last BM Date: 09/28/16 Baseline Weight: Weight: 90.3 kg (199 lb) Most recent weight: Weight: 87.5 kg (192 lb 14.4 oz)       Palliative Assessment/Data: PPS: 30%      Patient Active Problem List   Diagnosis Date Noted  . Abnormal liver function tests   . Transient alteration of awareness   . Palliative care by specialist   . DNR (do not resuscitate)   . Acute renal failure ([place])   . Atrial fibrillation with RVR ([place])   . Acute on chronic respiratory failure with hypoxia ([place]) 09/22/2016  . Hypoxemia 06/21/2016  . Physical deconditioning 06/21/2016  . Hypokalemia 05/08/2016  . Vertigo   . Acute on chronic respiratory failure ([place]) 05/07/2016  . 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 Malignant neoplasm of stomach ([place]) 02/22/2016  . Upper GI bleed   . Duodenitis determined by biopsy   . Panlobular  emphysema ([place])   . Essential hypertension   . Controlled diabetes mellitus type 2 with complications ([place])   . Influenza A 02/11/2016  . Duodenitis:Per EGD 02/11/2016 02/11/2016  . Warfarin-induced coagulopathy ([place])   . Malnutrition of moderate degree 02/10/2016  . Anemia of chronic disease 02/09/2016  . GI bleed 02/09/2016  . Dyspnea on exertion 01/09/2016  . Acute on chronic diastolic congestive heart failure ([place]) 12/21/2015  . Pressure injury of skin 11/17/2015  . Surgery, elective   . Coronary artery disease involving native coronary artery of native heart without angina pectoris 10/27/2015  . S/P CABG x 3   . History of MI (myocardial infarction)   . Persistent atrial fibrillation ([place]) 10/25/2015  . Seasonal allergic conjunctivitis 08/25/2015  . History of tobacco abuse 08/25/2015  . Diabetes mellitus ([place]) 03/11/2015  . Diabetes mellitus with stage 3 chronic kidney disease ([place]) 03/11/2015  . Metabolic bone disease 16/10/9602  . Vitamin D deficiency 03/11/2015  . Atopic dermatitis 02/24/2015  . Hip pain 09/24/2014  . Need for immunization against influenza 09/24/2014  . Benign essential hypertension 03/13/2014  . Generalized osteoarthritis of multiple sites 03/13/2014  . CKD (chronic kidney disease) stage 3, GFR 30-59 ml/min 03/04/2014  . Acute stress disorder 11/12/2013  . Seasonal and perennial allergic rhinitis 11/12/2013  . Allergic urticaria 11/12/2013  . Benign neoplasm of colon 11/12/2013  . Causalgia of upper extremity 11/12/2013  . Cervical radiculopathy 11/12/2013  . Neck pain 11/12/2013  . Weakness generalized 11/12/2013  . Hearing loss 11/12/2013  . Insomnia 11/12/2013  . Obesity 11/12/2013  . Premature ventricular contractions 11/12/2013  . Psychosexual dysfunction with inhibited sexual excitement 11/12/2013  . Hyperlipemia 09/11/2013  . Type 2 diabetes mellitus, uncontrolled ([place]) 09/11/2013  .  Pulmonary emphysema ([place]) 04/22/2013  . Esophageal  reflux   . Benign hypertension with chronic kidney disease, stage III   . Allergy   . Neuropathy [facility])     Palliative Care Assessment & Plan   Patient Profile:  82 y.o. male  admitted on 09/21/2016 with  Past medical history significant of PAF, CAD, CVA, chronic diastolic CHF last EF 76-54%, chronic respiratory failure on 2-3 L, DM type II, GI bleed in March 2018, esophageal adenocarcinoma; who presented to ER with complaints of acute shortness of breath. Patient  was recently hospitalized for acute on chronic congestive heart failure from 8/23-8/25. Since leaving hospital patient notes that he's been complaining of orthopnea with swelling in his legs and belly. Multiple hospitalizations over the past six months.  Pallative medicine consulted for [place].   Assessment/Recommendations/Plan   Rod Holler is requesting communication with primary physician regarding patient's status- patient's RN has paged him and requested he touch base with her  Continue full scope of care for now  Hard Choices book given to Rod Holler for review  Treat what is treatable  GOC is to d/c to rehab- Rod Holler is open to local rehab with longterm goal of moving to rehab closer to her in [place], however, will depend on patient's ability to overcome current clinical problems   Goals of Care and Additional Recommendations:  Limitations on Scope of Treatment: Full Scope Treatment  Code Status:  DNR  Prognosis:   Unable to determine likely less than 2mhs due to CHF, chronic kidney disease, significant decline in functional status in the last six months  Discharge Planning:  To Be Determined  Care plan was discussed with patient's daughter- RRod Holler  Thank you for allowing the Palliative Medicine Team to assist in the care of this patient.   Time In: 1530 Time Out: 1700 Total Time 90 minutes Prolonged Time Billed Yes      Greater than 50%  of this time was spent counseling and coordinating care related to the above  assessment and plan.  KMariana Kaufman AGNP-C Palliative Medicine   Please contact Palliative Medicine Team phone at [phone] questions and concerns.

## 2016-09-29 NOTE — Progress Notes (Signed)
Physical Therapy Treatment Patient Details Name: Mark Rivers MRN: [id number] DOB: [birth year] Today's Date: 09/29/2016    History of Present Illness Mark Rivers is a 82 y.o. male with a hx of CAD s/p CABG 10/2015, persistent AFib not on anticoagulation due to prior GI bleed, esophageal adenocarcinoma, DM2, CKD, HTN, HL, COPD, and chronic diastolic CHF, Afib RVR and CHF, FTT with multiple hospitalizations    PT Comments    Patient continues to require +2 assist for attempts of OOB mobility. Pt was unable to stand upright or maintain standing with max A +2 and RW due to c/o bilat hip pain. Pt's daughter present end of session. Continue to progress as tolerated with anticipated d/c to SNF for further skilled PT services.     Follow Up Recommendations  SNF;Other (comment)     Equipment Recommendations  None recommended by PT    Recommendations for Other Services       Precautions / Restrictions Precautions Precautions: Fall Restrictions Weight Bearing Restrictions: No    Mobility  Bed Mobility Overal bed mobility: Needs Assistance Bed Mobility: Supine to Sit     Supine to sit: +2 for physical assistance;HOB elevated;Max assist Sit to supine: +2 for safety/equipment;Max assist   General bed mobility comments: cues for sequencing and technique; use of rail; assist at [place] and trunk; worked on scooting toward [facility] with +2 assist, use of chuck pad, and cues for sequencing  Transfers Overall transfer level: Needs assistance Equipment used: Rolling walker (2 wheeled) Transfers: Sit to/from Stand Sit to Stand: Max assist;+2 physical assistance;From elevated surface         General transfer comment: attempted to stand X3 with max multimodal cues and assist to power up with use of chuck pad; pt able to bring buttocks off surface of bed but unable to achieve upright posture and maintain standing due to c/o bilat hip pain  Ambulation/Gait                 Stairs             Wheelchair Mobility    Modified Rankin (Stroke Patients Only)       Balance Overall balance assessment: Needs assistance;History of Falls Sitting-balance support: Bilateral upper extremity supported;Feet supported Sitting balance-Leahy Scale: Poor Sitting balance - Comments: posterior lean initially and then min guard for safety     Standing balance-Leahy Scale: Zero                              Cognition Arousal/Alertness: Awake/alert Behavior During Therapy: Restless Overall Cognitive Status: Impaired/Different from baseline Area of Impairment: Following commands;Safety/judgement;Problem solving                       Following Commands: Follows one step commands with increased time;Follows one step commands inconsistently (and multimodal cues) Safety/Judgement: Decreased awareness of safety;Decreased awareness of deficits   Problem Solving: Slow processing;Decreased initiation;Requires verbal cues;Requires tactile cues;Difficulty sequencing        Exercises      General Comments General comments (skin integrity, edema, etc.): daughter present last hald of session      Pertinent Vitals/Pain Pain Assessment: Faces Faces Pain Scale: Hurts even more Pain Location: hips Pain Descriptors / Indicators: Grimacing;Guarding;Moaning;Sore ("hurts!") Pain Intervention(s): Limited activity within patient's tolerance;Monitored during session;Repositioned;Premedicated before session    Home Living  Prior Function            PT Goals (current goals can now be found in the care plan section) Acute Rehab PT Goals PT Goal Formulation: Patient unable to participate in goal setting Time For Goal Achievement: 10/08/16 Potential to Achieve Goals: Fair Progress towards PT goals: Not progressing toward goals - comment    Frequency    Min 3X/week      PT Plan Current plan remains appropriate     Co-evaluation              AM-PAC PT "6 Clicks" Daily Activity  Outcome Measure  Difficulty turning over in bed (including adjusting bedclothes, sheets and blankets)?: A Lot Difficulty moving from lying on back to sitting on the side of the bed? : Unable Difficulty sitting down on and standing up from a chair with arms (e.g., wheelchair, bedside commode, etc,.)?: Unable Help needed moving to and from a bed to chair (including a wheelchair)?: Total Help needed walking in hospital room?: Total Help needed climbing 3-5 steps with a railing? : Total 6 Click Score: 7    End of Session Equipment Utilized During Treatment: Gait belt;Oxygen Activity Tolerance: Patient limited by pain Patient left: with call bell/phone within reach;in bed;with family/visitor present Nurse Communication: Mobility status;Need for lift equipment PT Visit Diagnosis: Unsteadiness on feet (R26.81);Muscle weakness (generalized) (M62.81)     Time: 5003-7048 PT Time Calculation (min) (ACUTE ONLY): 28 min  Charges:  $Therapeutic Activity: 23-37 mins                    G Codes:       Earney Navy, PTA Pager: [phone]     Darliss Cheney 09/29/2016, 11:01 AM

## 2016-09-29 NOTE — Progress Notes (Signed)
Daily Rounding Note  09/29/2016, 8:54 AM  LOS: 7 days   SUBJECTIVE:   Chief complaint: None.      Pt's dtr Rod Holler in room.  Pt is eating his breakfast, is calm and alert.    OBJECTIVE:         Vital signs in last 24 hours:    Temp:  [97.6 F (36.4 C)-98.3 F (36.8 C)] 97.9 F (36.6 C) (09/21 0846) Pulse Rate:  [79-105] 86 (09/21 0846) Resp:  [14-18] 14 (09/21 0846) BP: (113-152)/(63-81) 113/67 (09/21 0846) SpO2:  [94 %-100 %] 98 % (09/21 0846) Weight:  [87.5 kg (192 lb 14.4 oz)] 87.5 kg (192 lb 14.4 oz) (09/21 0534) Last BM Date: 09/28/16 Filed Weights   09/27/16 0611 09/28/16 0535 09/29/16 0534  Weight: 92.4 kg (203 lb 12.8 oz) 91.1 kg (200 lb 14.4 oz) 87.5 kg (192 lb 14.4 oz)   General: Pleasant   Heart: irrreg, irreg, rate in 90s.   Chest: clear bil.  Coughing with some of his swallows.   Abdomen: soft, NT, ND.  Active BS.   Extremities: no CCE.   Neuro/Psych:  Pleasant, calm, fully oriented and appropriate. Moving all 4s.     Intake/Output from previous day: 09/20 0701 - 09/21 0700 In: 1180 [P.O.:1180] Out: 2575 [Urine:2575]  Intake/Output this shift: No intake/output data recorded.  Lab Results:  Recent Labs  09/27/16 0409 09/29/16 0742  WBC 18.4* 15.0*  HGB 9.7* 9.9*  HCT 31.4* 32.7*  PLT 431* 435*   BMET  Recent Labs  09/27/16 0409 09/28/16 0417  NA 129* 130*  K 5.1 4.8  CL 89* 91*  CO2 26 27  GLUCOSE 73 89  BUN 100* 109*  CREATININE 2.08* 2.02*  CALCIUM 8.5* 8.5*   LFT  Recent Labs  09/27/16 0409 09/28/16 0417  PROT 6.6 6.4*  ALBUMIN 2.7* 2.7*  AST 722* 806*  ALT 466* 545*  ALKPHOS 140* 148*  BILITOT 1.8* 1.8*   PT/INR  Recent Labs  09/28/16 0417 09/29/16 0742  LABPROT 22.6* 21.2*  INR 2.01 1.85   Hepatitis Panel  Recent Labs  09/28/16 0417  HEPBSAG Negative  HCVAB <0.1  HEPAIGM Negative  HEPBIGM Negative    Studies/Results: Ct Head Wo  Contrast  Result Date: 09/27/2016 CLINICAL DATA:  Acute confusion EXAM: CT HEAD WITHOUT CONTRAST TECHNIQUE: Contiguous axial images were obtained from the base of the skull through the vertex without intravenous contrast. COMPARISON:  08/31/2016 FINDINGS: Brain: Diffuse atrophic changes and chronic white matter ischemic changes are noted. No findings to suggest acute hemorrhage, acute infarction or space-occupying mass lesion are seen. Basal ganglia calcifications are noted bilaterally. Vascular: No hyperdense vessel or unexpected calcification. Skull: Normal. Negative for fracture or focal lesion. Sinuses/Orbits: No acute finding. Other: None. IMPRESSION: Chronic atrophic and ischemic changes stable from the prior exam. No acute abnormality noted. Electronically Signed   By: Inez Catalina M.D.   On: 09/27/2016 14:04    ASSESMENT:   * Rising LFTs, primarily transaminases and Alk Phos. Etiology felt to be heart failure associated hepatic congestion.  No ETOH after 2006. Fatty liver, o/w unremarkable abdominal ultrasound 9/18.  No suspect meds, Lipitor discontinued, last dose 9/17.      Elevated PT, normal INR.  Acute hepatitis panel negative.    * Esophageal adenocarcinoma. Dx 02/2016. EMR 05/2016. 07/2016 EGD with no visible recurrence, Dr Adria Devon cryoablated area of residual nodularity. Was due repeat EGD this month.    *  CHF, acute on chronic systolic/diastolic.  Atrial fibrillation, rate improved.  09/23/16 echo: EF 35 to 40%, unable to assess diastolic function, Aortic valve calcification and mild to moderate regurge, mod to severe MV regurge, moderate tricuspid regurge, dilated right atrium.  Last BNP was 9/13.    * AKI.   * Intermittent confusion, normal ammonia level. CT head 08/31/16 post fall/trauma: atrophy, white matter ischemia.  CT 9/19: no change.  Palliative care actively following..  Pt is fine today, with daughter at bedside.       *  Normocytic anemia.  Stable.    *  IDDM.      PLAN   *  Follow LFTs.    Azucena Freed  09/29/2016, 8:54 AM Pager: [phone]  GI ATTENDING  Interval history data reviewed. Patient seen and examined. Agree with interval progress note. Liver tests with modest improvement. No new issues. Continue supportive care and trend liver tests. GI will see on Monday. Dr. Havery Moros on call this weekend if interval help needed. Thank you  Docia Chuck. Geri Seminole., M.D. [facility] Division of Gastroenterology

## 2016-09-29 NOTE — Progress Notes (Signed)
PROGRESS NOTE    Mark Rivers  GHW:[id number] DOB: 1934/11/19 DOA: 09/21/2016 PCP: [name], MD    Brief Narrative:  82 year old male presented with dyspnea. Patient does have the significant past medical history of paroxysmal atrial fibrillation, coronary artery disease, cerebrovascular accident, diastolic heart failure, type 2 diabetes mellitus, esophageal adenocarcinoma, and chronic hypoxic respiratory failure. Recent hospitalization for decompensated heart failure 8/23-8/25, since his discharge he has been experiencing lower extremity edema, abdominal edema and orthopnea. On initial physical examination blood pressure 112/86, heart rate 144, respiratory rate 20, oxygen saturation 96%, moist mucous membranes, lungs had rales bilaterally with signs of respiratory distress, heart S1-S2 present irregularly irregular, no gallops, rubs or murmurs, abdomen soft, nontender, positive lower extremity edema 2+.  Patient was admitted to the hospital with working diagnosis of acute on chronic hypoxic respiratory failure due to decompensated diastolic heart failure acute on chronic.  Patient has been aggressively diuresed, with no significant improving, hospital stay, complicated by confusion and delirium. He was determined not to be a good candidate for dialysis. Palliative care team had been consulted.   Assessment & Plan:   Principal Problem:   Acute on chronic respiratory failure with hypoxia (HCC) Active Problems:   Diabetes mellitus with stage 3 chronic kidney disease (HCC)   Persistent atrial fibrillation (HCC)   Acute on chronic diastolic congestive heart failure (HCC)   Anemia of chronic disease   Palliative care by specialist   DNR (do not resuscitate)   Acute renal failure (HCC)   Atrial fibrillation with RVR (HCC)   Transient alteration of awareness   Abnormal liver function tests   1. Acute on chronic decompensated heart failure. Will continue rate control with  metoprolol, no anticoagulation for now, will continue with aggressive diuresis with furosemide 80 mg IV tid, urine output 2,575 ml over last 24 hours, positive edema but no frank jvd or signs of pulmonary edema.   2. AKI on CKD stage IV. Tolerating well diuresis, serum cr at 1,7 from 2,0, with serum K at 4, and serum bicarbonate at 32, will follow on renal panel in am, avoid hypotension or nephrotoxic medications.   3. Atrial fibrillation. Will continue rate control with metoprolol, no anticoagulation. No palpitations and responding to diuresis.  4. Metabolic encephalopathy. Patient able to respond to simple questions not agitated. Continue trazodone.   5. T2DM. Will continue glucose cover and monitoring, fasting glucose 84. Continue insulin sliding scale for glucose cover and monitoring, capillary glucose 74, 106, 80 and 133, will reduce basal insulin to prevent hypoglycemia.    DVT prophylaxis: heparin  Code Status: DNR Family Communication:  Disposition Plan:    Consultants:   Cardiology  Nephrology  Palliative care  Procedures:     Antimicrobials:       Subjective: This am patient reports his dyspnea to be stable, not out of bed yet per his report, no chest pain or cough, no nausea or vomiting.   Objective: Vitals:   09/28/16 1210 09/28/16 2100 09/29/16 0200 09/29/16 0534  BP: 119/63 (!) 152/65  120/81  Pulse: (!) 105 (!) 102  79  Resp: 18 18  16   Temp: 98 F (36.7 C) 98.3 F (36.8 C)  97.6 F (36.4 C)  TempSrc: Oral Oral  Oral  SpO2: 94% 100% 98% 100%  Weight:    87.5 kg (192 lb 14.4 oz)  Height:        Intake/Output Summary (Last 24 hours) at 09/29/16 0814 Last data filed at 09/29/16 0700  Gross per 24 hour  Intake             1180 ml  Output             2575 ml  Net            -1395 ml   Filed Weights   09/27/16 0611 09/28/16 0535 09/29/16 0534  Weight: 92.4 kg (203 lb 12.8 oz) 91.1 kg (200 lb 14.4 oz) 87.5 kg (192 lb 14.4 oz)     Examination:  General: deconditioned Neurology: Awake and non focal  E ENT: mild pallor, no icterus, oral mucosa moist Cardiovascular: No significant JVD. S1-S2 present, irregullary irregular, no gallops, rubs, or murmurs. +/++ pitting lower extremity edema, more on right than left.  Pulmonary: decreased breath sounds bilaterally, adequate air movement, no wheezing, rhonchi, positive rales at bases, more on right than left. Anterior auscultation.  Gastrointestinal. Abdomen flat, no organomegaly, non tender, no rebound or guarding Skin. No rashes Musculoskeletal: no joint deformities     Data Reviewed: I have personally reviewed following labs and imaging studies  CBC:  Recent Labs Lab 09/23/16 0604 09/25/16 0448 09/26/16 0527 09/27/16 0409 09/29/16 0742  WBC 17.6* 18.1* 18.0* 18.4* 15.0*  HGB 9.2* 9.4* 9.4* 9.7* 9.9*  HCT 30.2* 30.8* 30.8* 31.4* 32.7*  MCV 86.5 85.3 84.4 84.9 85.2  PLT 380 368 399 431* 366*   Basic Metabolic Panel:  Recent Labs Lab 09/23/16 0604 09/24/16 0426 09/25/16 0448 09/26/16 0527 09/27/16 0409 09/28/16 0417  NA 130* 128* 131* 130* 129* 130*  K 4.3 4.6 4.7 4.5 5.1 4.8  CL 93* 90* 93* 92* 89* 91*  CO2 23 28 27 26 26 27   GLUCOSE 126* 151* 142* 111* 73 89  BUN 70* 82* 89* 90* 100* 109*  CREATININE 2.63* 2.45* 2.41* 2.06* 2.08* 2.02*  CALCIUM 8.3* 8.1* 8.3* 8.3* 8.5* 8.5*  MG 2.3  --   --   --   --   --    GFR: Estimated Creatinine Clearance: 30.8 mL/min (A) (by C-G formula based on SCr of 2.02 mg/dL (H)). Liver Function Tests:  Recent Labs Lab 09/25/16 0448 09/26/16 0527 09/27/16 0409 09/28/16 0417  AST 564* 579* 722* 806*  ALT 380* 386* 466* 545*  ALKPHOS 125 135* 140* 148*  BILITOT 1.4* 1.7* 1.8* 1.8*  PROT 6.1* 6.5 6.6 6.4*  ALBUMIN 2.7* 2.6* 2.7* 2.7*   No results for input(s): LIPASE, AMYLASE in the last 168 hours.  Recent Labs Lab 09/25/16 0448 09/27/16 1150  AMMONIA 31 34   Coagulation Profile:  Recent  Labs Lab 09/28/16 0417  INR 2.01   Cardiac Enzymes:  Recent Labs Lab 09/22/16 1406  TROPONINI 0.21*   BNP (last 3 results) No results for input(s): PROBNP in the last 8760 hours. HbA1C: No results for input(s): HGBA1C in the last 72 hours. CBG:  Recent Labs Lab 09/28/16 0801 09/28/16 1158 09/28/16 1649 09/28/16 2119 09/29/16 0722  GLUCAP 96 94 74 106* 80   Lipid Profile: No results for input(s): CHOL, HDL, LDLCALC, TRIG, CHOLHDL, LDLDIRECT in the last 72 hours. Thyroid Function Tests: No results for input(s): TSH, T4TOTAL, FREET4, T3FREE, THYROIDAB in the last 72 hours. Anemia Panel: No results for input(s): VITAMINB12, FOLATE, FERRITIN, TIBC, IRON, RETICCTPCT in the last 72 hours.    Radiology Studies: I have reviewed all of the imaging during this hospital visit personally     Scheduled Meds: . enoxaparin (LOVENOX) injection  40 mg Subcutaneous Daily  . furosemide  80 mg Intravenous TID  . insulin aspart  0-9 Units Subcutaneous TID WC  . insulin glargine  15 Units Subcutaneous QHS  . metoprolol succinate  37.5 mg Oral BID  . pantoprazole  40 mg Oral Q0600  . sodium chloride flush  3 mL Intravenous Q12H  . traZODone  100 mg Oral QHS  . vitamin C  500 mg Oral Daily   Continuous Infusions: . sodium chloride       LOS: 7 days        Mauricio Gerome Apley, MD [facility] Pager [phone]

## 2016-09-29 NOTE — Progress Notes (Signed)
Paged MD to give daughter at bedside an update.

## 2016-09-29 NOTE — NC FL2 (Signed)
[place] LEVEL OF CARE SCREENING TOOL     IDENTIFICATION  Patient Name: Mark Rivers Birthdate: 08-08-1934 Sex: male Admission Date (Current Location): 09/21/2016  [facility] and [place] Number:  Herbalist and Address:  [facility] [place] [address], [place], [place] [address]      Provider Number: [id number]  Attending Physician Name and Address:  Tawni Millers  Relative Name and Phone Number:       Current Level of Care: Hospital Recommended Level of Care: [place] Prior Approval Number:    Date Approved/Denied:   PASRR Number:    Discharge Plan: SNF    Current Diagnoses: Patient Active Problem List   Diagnosis Date Noted  . Abnormal liver function tests   . Transient alteration of awareness   . Palliative care by specialist   . DNR (do not resuscitate)   . Acute renal failure ([place])   . Atrial fibrillation with RVR ([place])   . Acute on chronic respiratory failure with hypoxia ([place]) 09/22/2016  . Hypoxemia 06/21/2016  . Physical deconditioning 06/21/2016  . Hypokalemia 05/08/2016  . Vertigo   . Acute on chronic respiratory failure ([place]) 05/07/2016  . 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 Malignant neoplasm of stomach ([place]) 02/22/2016  . Upper GI bleed   . Duodenitis determined by biopsy   . Panlobular emphysema ([place])   . Essential hypertension   . Controlled diabetes mellitus type 2 with complications ([place])   . Influenza A 02/11/2016  . Duodenitis:Per EGD 02/11/2016 02/11/2016  . Warfarin-induced coagulopathy ([place])   . Malnutrition of moderate degree 02/10/2016  . Anemia of chronic disease 02/09/2016  . GI bleed 02/09/2016  . Dyspnea on exertion 01/09/2016  . Acute on chronic diastolic congestive heart failure ([place]) 12/21/2015  . Pressure injury of skin 11/17/2015  . Surgery, elective   . Coronary artery disease involving native coronary artery of native heart without angina pectoris  10/27/2015  . S/P CABG x 3   . History of MI (myocardial infarction)   . Persistent atrial fibrillation ([place]) 10/25/2015  . Seasonal allergic conjunctivitis 08/25/2015  . History of tobacco abuse 08/25/2015  . Diabetes mellitus ([place]) 03/11/2015  . Diabetes mellitus with stage 3 chronic kidney disease ([place]) 03/11/2015  . Metabolic bone disease 43/32/9518  . Vitamin D deficiency 03/11/2015  . Atopic dermatitis 02/24/2015  . Hip pain 09/24/2014  . Need for immunization against influenza 09/24/2014  . Benign essential hypertension 03/13/2014  . Generalized osteoarthritis of multiple sites 03/13/2014  . CKD (chronic kidney disease) stage 3, GFR 30-59 ml/min 03/04/2014  . Acute stress disorder 11/12/2013  . Seasonal and perennial allergic rhinitis 11/12/2013  . Allergic urticaria 11/12/2013  . Benign neoplasm of colon 11/12/2013  . Causalgia of upper extremity 11/12/2013  . Cervical radiculopathy 11/12/2013  . Neck pain 11/12/2013  . Weakness generalized 11/12/2013  . Hearing loss 11/12/2013  . Insomnia 11/12/2013  . Obesity 11/12/2013  . Premature ventricular contractions 11/12/2013  . Psychosexual dysfunction with inhibited sexual excitement 11/12/2013  . Hyperlipemia 09/11/2013  . Type 2 diabetes mellitus, uncontrolled ([place]) 09/11/2013  . Pulmonary emphysema ([place]) 04/22/2013  . Esophageal reflux   . Benign hypertension with chronic kidney disease, stage III   . Allergy   . Neuropathy ([place])     Orientation RESPIRATION BLADDER Height & Weight     Self, Time, Situation, Place  O2 (Nasal Canula 6 L) Incontinent, External catheter (At times) Weight: 192 lb  14.4 oz (87.5 kg) Height:  5\' 7"  (170.2 cm)  BEHAVIORAL SYMPTOMS/MOOD NEUROLOGICAL BOWEL NUTRITION STATUS  Other (Comment) (Anxious, gets agitated and restless. Yells out.)  (None) Continent Diet (Heart healthy/carb modified)  AMBULATORY STATUS COMMUNICATION OF NEEDS Skin   Extensive Assist Verbally Bruising, Other (Comment)  (MASD.)                       Personal Care Assistance Level of Assistance  Bathing, Feeding, Dressing Bathing Assistance: Maximum assistance Feeding assistance: Limited assistance Dressing Assistance: Maximum assistance     Functional Limitations Info  Sight, Hearing, Speech Sight Info: Adequate Hearing Info: Adequate Speech Info: Adequate    SPECIAL CARE FACTORS FREQUENCY  PT (By licensed PT), Blood pressure, OT (By licensed OT)     PT Frequency: 5 x week OT Frequency: 5 x week            Contractures Contractures Info: Not present    Additional Factors Info  Code Status, Allergies Code Status Info: DNR Allergies Info: Amoxicillin, Cephalexin, Penicillin G           Current Medications (09/29/2016):  This is the current hospital active medication list Current Facility-Administered Medications  Medication Dose Route Frequency Provider Last Rate Last Dose  . acetaminophen (TYLENOL) tablet 500 mg  500 mg Oral Q6H PRN Modena Jansky, MD   500 mg at 09/29/16 0227  . enoxaparin (LOVENOX) injection 40 mg  40 mg Subcutaneous Daily Vernell Leep D, MD   40 mg at 09/29/16 0928  . furosemide (LASIX) injection 80 mg  80 mg Intravenous TID Florencia Reasons, MD   80 mg at 09/29/16 1517  . guaiFENesin-dextromethorphan (ROBITUSSIN DM) 100-10 MG/5ML syrup 5 mL  5 mL Oral Q4H PRN Florencia Reasons, MD   5 mL at 09/29/16 0227  . insulin aspart (novoLOG) injection 0-9 Units  0-9 Units Subcutaneous TID WC Fuller Plan A, MD   1 Units at 09/29/16 1227  . insulin glargine (LANTUS) injection 15 Units  15 Units Subcutaneous QHS Hongalgi, Anand D, MD      . metoprolol succinate (TOPROL-XL) 24 hr tablet 37.5 mg  37.5 mg Oral BID Pixie Casino, MD   37.5 mg at 09/29/16 6160  . metoprolol tartrate (LOPRESSOR) injection 5 mg  5 mg Intravenous Q4H PRN Thurnell Lose, MD   5 mg at 09/26/16 7371  . ondansetron (ZOFRAN) injection 4 mg  4 mg Intravenous Q6H PRN Smith, Rondell A, MD      .  pantoprazole (PROTONIX) EC tablet 40 mg  40 mg Oral Q0600 Fuller Plan A, MD   40 mg at 09/29/16 0540  . traMADol (ULTRAM) tablet 50 mg  50 mg Oral Q6H PRN Fuller Plan A, MD   50 mg at 09/29/16 0540  . traZODone (DESYREL) tablet 100 mg  100 mg Oral QHS Smith, Rondell A, MD   100 mg at 09/28/16 2124  . vitamin C (ASCORBIC ACID) tablet 500 mg  500 mg Oral Daily Fuller Plan A, MD   500 mg at 09/29/16 0626     Discharge Medications: Please see discharge summary for a list of discharge medications.  Relevant Imaging Results:  Relevant Lab Results:   Additional Information SS#: [id number]  Candie Chroman, LCSW

## 2016-09-29 NOTE — Progress Notes (Signed)
Paged MD pr daughters request for an update.

## 2016-09-29 NOTE — Clinical Social Work Note (Signed)
Clinical Social Work Assessment  Patient Details  Name: Mark Rivers MRN: [id number] Date of Birth: 1934/10/28  Date of referral:  09/29/16               Reason for consult:  Facility Placement, Discharge Planning                Permission sought to share information with:  Facility Sport and exercise psychologist, Family Supports Permission granted to share information::  Yes, Verbal Permission Granted  Name::     Ines Warf  Agency::  SNF's  Relationship::  Daughter  Contact Information:  [phone]  Housing/Transportation Living arrangements for the past 2 months:  [place] of Information:  Medical Team, Adult Children Patient Interpreter Needed:  None Criminal Activity/Legal Involvement Pertinent to Current Situation/Hospitalization:  No - Comment as needed Significant Relationships:  Adult Children Lives with:  Self Do you feel safe going back to the place where you live?  No Need for family participation in patient care:  Yes (Comment)  Care giving concerns:  PT recommending SNF once medically stable for discharge.   Social Worker assessment / plan:  Patient sleeping and did not arouse during conversation. Daughter, Rod Holler, at bedside. CSW introduced role and explained that PT recommendations would be discussed. Patient's daughter is agreeable to SNF placement but wants patient to have rehab in [place] where all of his children live. Initially, the plan for for the patient to stay at his son's house but after his daughter saw him work with PT this morning, she knew that would not be a good option. CSW printed off several SNF options in the area where they live. Patient's daughter will review and call CSW with choice for where to send referral. She plans to take him to [place] by car and states she thinks he has a portable oxygen tank at home. No further concerns. CSW encouraged patient's daughter to contact CSW as needed. CSW will continue to follow patient and his  family for support and facilitate discharge to SNF once medically stable.  Employment status:  Retired Forensic scientist:  Medicare PT Recommendations:  [place] / Referral to community resources:  [place]  Patient/Family's Response to care:  Patient asleep. Patient's daughter agreeable to SNF placement. Patient's children supportive and involved in patient's care. Patient's daughter appreciated social work intervention.  Patient/Family's Understanding of and Emotional Response to Diagnosis, Current Treatment, and Prognosis:  Patient asleep. Patient and daughter per RN have a good understanding of the reason for admission and his need for rehab prior to returning home. Patient's daughter appears happy with hospital care.  Emotional Assessment Appearance:  Appears stated age Attitude/Demeanor/Rapport:  Unable to Assess Affect (typically observed):  Unable to Assess Orientation:  Oriented to Self, Oriented to Place, Oriented to  Time, Oriented to Situation Alcohol / Substance use:  Never Used Psych involvement (Current and /or in the community):  No (Comment)  Discharge Needs  Concerns to be addressed:  Care Coordination Readmission within the last 30 days:  Yes Current discharge risk:  Dependent with Mobility, Lives alone Barriers to Discharge:  Continued Medical Work up, Other (Looking for SNF in [place])   Candie Chroman, LCSW 09/29/2016, 12:40 PM

## 2016-09-29 NOTE — Progress Notes (Signed)
HR improved today - continues to diurese (1.3L negative yesterday). Creatinine continues to improve - now 1.73 (down from 2.02). No new cardiac suggestions at this point. Cardiology will be available over the weekend as needed.  Pixie Casino, MD, [place]  Attending Cardiologist  Direct Dial: [phone]  Fax: [phone]  Website:  [email or web address]

## 2016-09-30 LAB — CBC WITH DIFFERENTIAL/PLATELET
BASOS PCT: 0 %
Basophils Absolute: 0 10*3/uL (ref 0.0–0.1)
EOS PCT: 2 %
Eosinophils Absolute: 0.3 10*3/uL (ref 0.0–0.7)
HEMATOCRIT: 33.7 % — AB (ref 39.0–52.0)
Hemoglobin: 10.1 g/dL — ABNORMAL LOW (ref 13.0–17.0)
Lymphocytes Relative: 11 %
Lymphs Abs: 1.6 10*3/uL (ref 0.7–4.0)
MCH: 25.6 pg — AB (ref 26.0–34.0)
MCHC: 30 g/dL (ref 30.0–36.0)
MCV: 85.5 fL (ref 78.0–100.0)
MONO ABS: 0.6 10*3/uL (ref 0.1–1.0)
MONOS PCT: 4 %
NEUTROS ABS: 11.9 10*3/uL — AB (ref 1.7–7.7)
Neutrophils Relative %: 83 %
PLATELETS: 413 10*3/uL — AB (ref 150–400)
RBC: 3.94 MIL/uL — ABNORMAL LOW (ref 4.22–5.81)
RDW: 17.3 % — AB (ref 11.5–15.5)
WBC: 14.4 10*3/uL — AB (ref 4.0–10.5)

## 2016-09-30 LAB — GLUCOSE, CAPILLARY
GLUCOSE-CAPILLARY: 178 mg/dL — AB (ref 65–99)
Glucose-Capillary: 155 mg/dL — ABNORMAL HIGH (ref 65–99)
Glucose-Capillary: 139 mg/dL — ABNORMAL HIGH (ref 65–99)
Glucose-Capillary: 162 mg/dL — ABNORMAL HIGH (ref 65–99)

## 2016-09-30 LAB — PROTIME-INR
INR: 1.57
Prothrombin Time: 18.7 seconds — ABNORMAL HIGH (ref 11.4–15.2)

## 2016-09-30 LAB — COMPREHENSIVE METABOLIC PANEL
ALBUMIN: 2.5 g/dL — AB (ref 3.5–5.0)
ALK PHOS: 153 U/L — AB (ref 38–126)
ALT: 407 U/L — AB (ref 17–63)
AST: 432 U/L — ABNORMAL HIGH (ref 15–41)
Anion gap: 8 (ref 5–15)
BILIRUBIN TOTAL: 1.8 mg/dL — AB (ref 0.3–1.2)
BUN: 89 mg/dL — ABNORMAL HIGH (ref 6–20)
CALCIUM: 8.6 mg/dL — AB (ref 8.9–10.3)
CO2: 34 mmol/L — AB (ref 22–32)
CREATININE: 1.41 mg/dL — AB (ref 0.61–1.24)
Chloride: 96 mmol/L — ABNORMAL LOW (ref 101–111)
GFR calc Af Amer: 53 mL/min — ABNORMAL LOW (ref >60)
GFR calc non Af Amer: 45 mL/min — ABNORMAL LOW (ref >60)
GLUCOSE: 113 mg/dL — AB (ref 65–99)
Potassium: 3.4 mmol/L — ABNORMAL LOW (ref 3.5–5.1)
Sodium: 138 mmol/L (ref 135–145)
TOTAL PROTEIN: 6.1 g/dL — AB (ref 6.5–8.1)

## 2016-09-30 MED ORDER — POTASSIUM CHLORIDE CRYS ER 20 MEQ PO TBCR
40.0000 meq | EXTENDED_RELEASE_TABLET | Freq: Once | ORAL | Status: AC
  Administered 2016-09-30: 40 meq via ORAL
  Filled 2016-09-30: qty 2

## 2016-09-30 MED ORDER — ENSURE ENLIVE PO LIQD
237.0000 mL | Freq: Two times a day (BID) | ORAL | Status: DC
  Administered 2016-09-30 – 2016-10-05 (×9): 237 mL via ORAL

## 2016-09-30 NOTE — Progress Notes (Signed)
Initial Nutrition Assessment  DOCUMENTATION CODES:  Non-severe (moderate) malnutrition in context of acute illness/injury  INTERVENTION:    Given minimal intake, recommend diet liberalization to Regular to allow for more options and improve intake.    Magic cup TID with meals, each supplement provides 290 kcal and 9 grams of protein   Ensure Enlive po BID, each supplement provides 350 kcal and 20 grams of protein   Numerous meal interventions to be implemented (see note body)  NUTRITION DIAGNOSIS:  Inadequate oral intake related to lethargy/confusion, poor appetite, acute illness as evidenced by meal completion < 50%.  GOAL:  Patient will meet greater than or equal to 90% of their needs  MONITOR:  PO intake, Supplement acceptance, Diet advancement, Labs, Weight trends, I & O's  REASON FOR ASSESSMENT:  Malnutrition Screening Tool    ASSESSMENT:  82 y/o male PMhx PAF, CAD, CVA, CHF, chronic resp failure, DM2, CKD4, HTN, HLD, GERD, Esophageal adenocarcinoma s/p ablation. Presented with SOB. Worked up for respiratory failure 2/2 CHF, admitted for management.   Pt has been admitted x8 days. Per chart, pt Has had minimal improvement throughout hospital stay and course has been complicated by confusion and delirium. He has been determined to be a poor candidate for dialysis. Palliative care is following.   Per review of intake records, patient had intermittently been eating well up until about 9/17. Since then, he has not eaten >50% of any of his meals.   Pt still confused and would not answer any of RD's questions. Daughter was present and conversed with her regarding potential interventions to increase patients intake. Resulting interventions included adding magic cups/Ensure to diet, adding bowl of cold cereal with whole milk to dinners (he enjoys this), Orders tomato sandwich today for lunch w/ chips, changing the beverages he receives to gingerale (BGs well controlled at this  time). Will implement and follow intakes  True weight loss is difficult to determine due to fluctuations related to HF/CKD and active diuresis. On exam, patient displays. He was weighed outpatient at 199 lbs early this month. His UBW for this year appears to be 190-200 lbs.   Meds: Lasix, insulin, PPI, Vit C,  Labs:K:3.4, WBC:14.4, bun/creat:89/1.41 ->improving, Albumin: 2.5, K:3.4, LFTs elevated. A1C was 7.2   Recent Labs Lab 09/28/16 0417 09/29/16 0742 09/30/16 0529  NA 130* 135 138  K 4.8 4.0 3.4*  CL 91* 94* 96*  CO2 27 32 34*  BUN 109* 102* 89*  CREATININE 2.02* 1.73* 1.41*  CALCIUM 8.5* 8.5* 8.6*  GLUCOSE 89 84 113*   Diet Order:  Diet heart healthy/carb modified Room service appropriate? Yes; Fluid consistency: Thin  Skin: MSAD to buttocks   Last BM:  9/20  Height:  Ht Readings from Last 1 Encounters:  09/22/16 5\' 7"  (1.702 m)   Weight:  Wt Readings from Last 1 Encounters:  09/30/16 190 lb 0.6 oz (86.2 kg)   Wt Readings from Last 10 Encounters:  09/30/16 190 lb 0.6 oz (86.2 kg)  09/02/16 196 lb 12.8 oz (89.3 kg)  08/24/16 200 lb (90.7 kg)  08/18/16 200 lb (90.7 kg)  07/19/16 195 lb 6.4 oz (88.6 kg)  06/21/16 193 lb (87.5 kg)  06/08/16 190 lb 12.8 oz (86.5 kg)  05/17/16 193 lb (87.5 kg)  05/10/16 189 lb 12.8 oz (86.1 kg)  03/31/16 199 lb 12.8 oz (90.6 kg)   Ideal Body Weight:  67.27 kg  BMI:  Body mass index is 29.76 kg/m.  Estimated Nutritional Needs:  Kcal:  1800-2000 kcals (21-23 kcal/kg bw) Protein:  87-101g pro (1.3-1.5 g/kg ibw) Fluid:  Per MD  EDUCATION NEEDS:  No education needs identified at this time  Burtis Junes RD, LDN, [place] Clinical Nutrition Pager: [phone] 09/30/2016 11:27 AM

## 2016-09-30 NOTE — Progress Notes (Addendum)
Patient ID: Mark Rivers, male   DOB: [birth year], 82 y.o.   MRN: [id number]  PROGRESS NOTE    Mark Rivers  OMV:[id number] DOB: 1934-05-09 DOA: 09/21/2016  PCP: [name], MD   Brief Narrative:  82 year old male with atrial fibrillation, CAD, CVA, diastolic CHF, DM, esophageal carcinoma, recent hospitalization for decompensated CHF (8/23 - 8/25), has been experiencing LE edema since discharge. He was admitted for acute decompensated CHF. Seen by cardio in consultation.  GI consulted for abnormal LFT's.   Assessment & Plan:   Principal Problem:   Acute on chronic respiratory failure with hypoxia (HCC) / Acute on chronic diastolic congestive heart failure (HCC) - Appreciate cardio following - Continue lasix 80 mg IV TID - Continue daily weight and strict intake and output - Continue metoprolol    Active Problems:   Diabetes mellitus with stage 3 chronic kidney disease (HCC) - Continue Lantus 8 units at bedtime and SSI    Abnormal LFT's - GI following - Suspect from passive congestion fro CHF - US unremarkable     Persistent atrial fibrillation (HCC) - CHA2DS2-VASc Score 3 - Pt not on AC due to esophageal ca and prior GI bleed - Continue metoprolol for rate control     Acute kidney failure superimposed on CKD stage 4 - Baseline CR is 1.5 - Monitor daily BMP    Anemia of chronic disease - Due to esophageal adenocarcinoma    Esophageal adenocarcinoma - PCT consulted for goals of care   DVT prophylaxis: Lovenox subQ Code Status: DNR/DNI Family Communication: no family at the bedside Disposition Plan: home once cleared by GI and cardio    Consultants:   GI  Cardio   Palliative care  Procedures:   ECHO 09/23/16 - EF 35-40%  Antimicrobials:   None    Subjective: No overnight events.  Objective: Vitals:   09/29/16 2013 09/30/16 0600 09/30/16 1044 09/30/16 1300  BP: (!) 111/59 122/60 116/77 113/71  Pulse: 73 74 90 88  Resp: 18 19  18   Temp:  97.8 F (36.6 C) 97.8 F (36.6 C)  (!) 97.3 F (36.3 C)  TempSrc: Oral Oral  Oral  SpO2:  95% 99% 98%  Weight:  86.2 kg (190 lb 0.6 oz)    Height:        Intake/Output Summary (Last 24 hours) at 09/30/16 1343 Last data filed at 09/30/16 1100  Gross per 24 hour  Intake              120 ml  Output              950 ml  Net             -830 ml   Filed Weights   09/28/16 0535 09/29/16 0534 09/30/16 0600  Weight: 91.1 kg (200 lb 14.4 oz) 87.5 kg (192 lb 14.4 oz) 86.2 kg (190 lb 0.6 oz)    Examination:  General exam: Appears calm and comfortable  Respiratory system:  No wheezing or rhonchi  Cardiovascular system: S1 & S2 heard, RRR.  Gastrointestinal system: Abdomen is nondistended, soft and nontender. No organomegaly or masses felt. Normal bowel sounds heard. Central nervous system: Alert and oriented. No focal neurological deficits. Extremities: Symmetric 5 x 5 power. Skin: No rashes, lesions or ulcers Psychiatry: Judgement and insight appear normal. Mood & affect appropriate.   Data Reviewed: I have personally reviewed following labs and imaging studies  CBC:  Recent Labs Lab 09/25/16 0448 09/26/16 0527 09/27/16 0409  09/29/16 0742 09/30/16 0529  WBC 18.1* 18.0* 18.4* 15.0* 14.4*  NEUTROABS  --   --   --   --  11.9*  HGB 9.4* 9.4* 9.7* 9.9* 10.1*  HCT 30.8* 30.8* 31.4* 32.7* 33.7*  MCV 85.3 84.4 84.9 85.2 85.5  PLT 368 399 431* 435* 081*   Basic Metabolic Panel:  Recent Labs Lab 09/26/16 0527 09/27/16 0409 09/28/16 0417 09/29/16 0742 09/30/16 0529  NA 130* 129* 130* 135 138  K 4.5 5.1 4.8 4.0 3.4*  CL 92* 89* 91* 94* 96*  CO2 26 26 27  32 34*  GLUCOSE 111* 73 89 84 113*  BUN 90* 100* 109* 102* 89*  CREATININE 2.06* 2.08* 2.02* 1.73* 1.41*  CALCIUM 8.3* 8.5* 8.5* 8.5* 8.6*   GFR: Estimated Creatinine Clearance: 43.8 mL/min (A) (by C-G formula based on SCr of 1.41 mg/dL (H)). Liver Function Tests:  Recent Labs Lab 09/26/16 0527 09/27/16 0409  09/28/16 0417 09/29/16 0742 09/30/16 0529  AST 579* 722* 806* 547* 432*  ALT 386* 466* 545* 447* 407*  ALKPHOS 135* 140* 148* 127* 153*  BILITOT 1.7* 1.8* 1.8* 1.5* 1.8*  PROT 6.5 6.6 6.4* 5.9* 6.1*  ALBUMIN 2.6* 2.7* 2.7* 2.5* 2.5*   No results for input(s): LIPASE, AMYLASE in the last 168 hours.  Recent Labs Lab 09/25/16 0448 09/27/16 1150  AMMONIA 31 34   Coagulation Profile:  Recent Labs Lab 09/28/16 0417 09/29/16 0742 09/30/16 0529  INR 2.01 1.85 1.57   Cardiac Enzymes: No results for input(s): CKTOTAL, CKMB, CKMBINDEX, TROPONINI in the last 168 hours. BNP (last 3 results) No results for input(s): PROBNP in the last 8760 hours. HbA1C: No results for input(s): HGBA1C in the last 72 hours. CBG:  Recent Labs Lab 09/29/16 1150 09/29/16 1654 09/29/16 2243 09/30/16 0728 09/30/16 1226  GLUCAP 133* 158* 170* 155* 139*   Lipid Profile: No results for input(s): CHOL, HDL, LDLCALC, TRIG, CHOLHDL, LDLDIRECT in the last 72 hours. Thyroid Function Tests: No results for input(s): TSH, T4TOTAL, FREET4, T3FREE, THYROIDAB in the last 72 hours. Anemia Panel: No results for input(s): VITAMINB12, FOLATE, FERRITIN, TIBC, IRON, RETICCTPCT in the last 72 hours. Urine analysis:    Component Value Date/Time   COLORURINE YELLOW 09/22/2016 1400   APPEARANCEUR CLEAR 09/22/2016 1400   LABSPEC 1.012 09/22/2016 1400   PHURINE 5.0 09/22/2016 1400   GLUCOSEU NEGATIVE 09/22/2016 1400   HGBUR MODERATE (A) 09/22/2016 1400   BILIRUBINUR NEGATIVE 09/22/2016 1400   KETONESUR NEGATIVE 09/22/2016 1400   PROTEINUR 30 (A) 09/22/2016 1400   UROBILINOGEN 0.2 10/26/2008 1315   NITRITE NEGATIVE 09/22/2016 1400   LEUKOCYTESUR NEGATIVE 09/22/2016 1400   Sepsis Labs: @LABRCNTIP (procalcitonin:4,lacticidven:4)    Recent Results (from the past 240 hour(s))  Culture, blood (Routine X 2) w Reflex to ID Panel     Status: None (Preliminary result)   Collection Time: 09/26/16  3:33 PM  Result  Value Ref Range Status   Specimen Description BLOOD LEFT ARM  Final   Special Requests IN PEDIATRIC BOTTLE Blood Culture adequate volume  Final   Culture NO GROWTH 4 DAYS  Final   Report Status PENDING  Incomplete  Culture, blood (Routine X 2) w Reflex to ID Panel     Status: None (Preliminary result)   Collection Time: 09/26/16  3:36 PM  Result Value Ref Range Status   Specimen Description BLOOD RIGHT ARM  Final   Special Requests IN PEDIATRIC BOTTLE Blood Culture adequate volume  Final   Culture NO GROWTH 4 DAYS  Final   Report Status PENDING  Incomplete      Radiology Studies: Ct Head Wo Contrast  Result Date: 09/27/2016 CLINICAL DATA:  Acute confusion EXAM: CT HEAD WITHOUT CONTRAST TECHNIQUE: Contiguous axial images were obtained from the base of the skull through the vertex without intravenous contrast. COMPARISON:  08/31/2016 FINDINGS: Brain: Diffuse atrophic changes and chronic white matter ischemic changes are noted. No findings to suggest acute hemorrhage, acute infarction or space-occupying mass lesion are seen. Basal ganglia calcifications are noted bilaterally. Vascular: No hyperdense vessel or unexpected calcification. Skull: Normal. Negative for fracture or focal lesion. Sinuses/Orbits: No acute finding. Other: None. IMPRESSION: Chronic atrophic and ischemic changes stable from the prior exam. No acute abnormality noted. Electronically Signed   By: Inez Catalina M.D.   On: 09/27/2016 14:04   Dg Chest Port 1 View  Result Date: 09/26/2016 CLINICAL DATA:  Congestive heart failure, followup EXAM: PORTABLE CHEST 1 VIEW COMPARISON:  Portable chest x-ray of 09/24/2016 and CT chest of 02/21/2016 FINDINGS: The lungs are not optimally aerated. Therefore there is some volume loss at the lung bases. There does appear to be a left pleural effusion and possibly a small right pleural effusion as well. Cardiomegaly is stable and there may be mild pulmonary vascular congestion suggesting mild CHF.  Median sternotomy sutures are noted from prior CABG. No acute bony abnormality is seen. IMPRESSION: Probable mild CHF with cardiomegaly, small effusions, and pulmonary vascular congestion. Electronically Signed   By: Ivar Drape M.D.   On: 09/26/2016 14:52   US Abdomen Limited Ruq  Result Date: 09/26/2016 CLINICAL DATA:  Congestive heart failure. EXAM: ULTRASOUND ABDOMEN LIMITED RIGHT UPPER QUADRANT COMPARISON:  Ultrasound kidneys 09/22/2016. CT abdomen and pelvis 02/21/2016 FINDINGS: Gallbladder: No gallstones or wall thickening visualized. No sonographic Murphy sign noted by sonographer. Common bile duct: Diameter: 3 mm, normal Liver: Diffusely increased hepatic parenchymal echotexture suggesting fatty infiltration. No focal lesions identified. Portal vein is patent on color Doppler imaging with normal direction of blood flow towards the liver. IMPRESSION: Diffuse fatty infiltration of the liver. No evidence of cholelithiasis or cholecystitis. Electronically Signed   By: Lucienne Capers M.D.   On: 09/26/2016 21:15        Scheduled Meds: . enoxaparin (LOVENOX) injection  40 mg Subcutaneous Daily  . feeding supplement (ENSURE ENLIVE)  237 mL Oral BID BM  . furosemide  80 mg Intravenous TID  . insulin aspart  0-9 Units Subcutaneous TID WC  . insulin glargine  8 Units Subcutaneous QHS  . metoprolol succinate  37.5 mg Oral BID  . pantoprazole  40 mg Oral Q0600  . traZODone  100 mg Oral QHS  . vitamin C  500 mg Oral Daily   Continuous Infusions:   LOS: 8 days    Time spent: 25 minutes  Greater than 50% of the time spent on counseling and coordinating the care.   Leisa Lenz, MD [facility] Pager [phone]  If 7PM-7AM, please contact night-coverage [email or web address] Password [facility] 09/30/2016, 1:43 PM

## 2016-09-30 NOTE — Progress Notes (Signed)
Patient very uncomfortable c/o pain in the left hip which is chronic.  Had to talk patient into taking Tylenol for the pain.  RN and tech x 2 got patient up to chair, very difficult to get up, max assist from all involved.  Patient sat up in chair for approximately one hour before demanding to go back to bed.  Again patient made very little effort to get up, kept flopping back in chair.  RN and 2 techs again got patient to his feet and pivoted him over to bed.  RN discussed with patient and daughter that the more he stays in the bed the weaker he will become and that he has to be willing to work with [place] if he wants to get stronger.  Patient shows no interest in what RN is saying.  Will continue to encourage patient to get out of bed.

## 2016-10-01 ENCOUNTER — Inpatient Hospital Stay (HOSPITAL_COMMUNITY): Payer: Medicare Other

## 2016-10-01 LAB — CULTURE, BLOOD (ROUTINE X 2)
Culture: NO GROWTH
Culture: NO GROWTH
SPECIAL REQUESTS: ADEQUATE
Special Requests: ADEQUATE

## 2016-10-01 LAB — GLUCOSE, CAPILLARY
Glucose-Capillary: 129 mg/dL — ABNORMAL HIGH (ref 65–99)
Glucose-Capillary: 191 mg/dL — ABNORMAL HIGH (ref 65–99)
Glucose-Capillary: 160 mg/dL — ABNORMAL HIGH (ref 65–99)
Glucose-Capillary: 172 mg/dL — ABNORMAL HIGH (ref 65–99)

## 2016-10-01 LAB — PROTIME-INR
INR: 1.39
Prothrombin Time: 17 seconds — ABNORMAL HIGH (ref 11.4–15.2)

## 2016-10-01 LAB — COOXEMETRY PANEL
Carboxyhemoglobin: 1.5 % (ref 0.5–1.5)
Methemoglobin: 1.2 % (ref 0.0–1.5)
O2 SAT: 58.7 %
TOTAL HEMOGLOBIN: 10.1 g/dL — AB (ref 12.0–16.0)

## 2016-10-01 MED ORDER — SODIUM CHLORIDE 0.9% FLUSH
10.0000 mL | INTRAVENOUS | Status: DC | PRN
  Administered 2016-10-02 – 2016-10-03 (×5): 10 mL
  Administered 2016-10-05: 20 mL
  Filled 2016-10-01 (×6): qty 40

## 2016-10-01 NOTE — Progress Notes (Signed)
VSS during 7 a to 7 p shift, patient continues to yell out intermittently even with staff in room, when asked if he hurts or if something is wrong he will answer no.  Patient received Tramadol x 2 this shift for c/o chronic left hip pain (mostly with activity).  Did sit up in chair for several hours this shift, up with max 1 assist and walker.  Daughter at bedside for most of shift.  Patient does seem more comfortable with air loss mattress, continues to have significant MASD to buttocks, foam and barrier cream in use.  Wearing condom cath as well for moisture control.

## 2016-10-01 NOTE — Progress Notes (Signed)
Patient ID: Mark Rivers, male   DOB: July 07, 1934, 82 y.o.   MRN: [id number]  PROGRESS NOTE    Mark Rivers  SWN:[id number] DOB: November 01, 1934 DOA: 09/21/2016  PCP: [name], MD   Brief Narrative:  82 year old male with atrial fibrillation, CAD, CVA, diastolic CHF, DM, esophageal carcinoma, recent hospitalization for decompensated CHF (8/23 - 8/25), has been experiencing LE edema since discharge. He was admitted for acute decompensated CHF. Seen by cardio in consultation.  GI consulted for abnormal LFT's.   Assessment & Plan:   Principal Problem:   Acute on chronic respiratory failure with hypoxia (HCC) / Acute on chronic diastolic congestive heart failure (HCC) - Continue Lasix 80 mg IV 3 times daily - Weight is 82.1 kg today 10/01/2016; weight is down by 9 pounds since yesterday (4 kg) - Appreciate cardiology following - Replete electrolytes  - Continue daily weight and strict intake and output    Active Problems:   Diabetes mellitus with stage 3 chronic kidney disease (HCC) - Continue Lantus 8 units at bedtime and sliding scale insulin    Abnormal LFT's - AST is 432, ALT 407, ALP 153 and total bilirubin 1.8 on 09/30/2016 - Follow-up LFTs tomorrow morning - Ultrasound was unremarkable - Suspected this is passive congestion from CHF    Persistent atrial fibrillation (HCC) - CHA2DS2-VASc Score 3 - Patient is not on anticoagulation secondary to esophageal adenocarcinoma and prior GI bleed - Continue metoprolol for heart rate control    Acute kidney failure superimposed on CKD stage 4 - Creatinine is 1.5 - BMP pending this morning    Essential hypertension - Continue metoprolol    Hypokalemia - Supplemented - Follow-up BMP tomorrow morning    Anemia of chronic disease - Likely due to esophageal adenocarcinoma - CBC pending this morning    Esophageal adenocarcinoma - Appreciate palliative following for goals of care   DVT prophylaxis: Lovenox subQ Code  Status: DNR/DNI Family Communication: no family at the bedside this am Disposition Plan: D/C once cleared by cardio    Consultants:   GI  Cardiology  Palliative care team   Procedures:   ECHO 09/23/16 - EF 35-40%  Antimicrobials:   None    Subjective: No overnight events.  Objective: Vitals:   09/30/16 1300 09/30/16 1609 09/30/16 2011 10/01/16 0602  BP: 113/71 129/71 (!) 144/69 124/72  Pulse: 88 (!) 101 99 98  Resp: 18  18   Temp: (!) 97.3 F (36.3 C)  97.8 F (36.6 C) 97.8 F (36.6 C)  TempSrc: Oral  Oral Oral  SpO2: 98% 98% 97% 98%  Weight:    82.1 kg (181 lb)  Height:        Intake/Output Summary (Last 24 hours) at 10/01/16 0728 Last data filed at 10/01/16 0300  Gross per 24 hour  Intake              480 ml  Output             1550 ml  Net            -1070 ml   Filed Weights   09/29/16 0534 09/30/16 0600 10/01/16 0602  Weight: 87.5 kg (192 lb 14.4 oz) 86.2 kg (190 lb 0.6 oz) 82.1 kg (181 lb)    Physical Exam  Constitutional: Appears well-developed and well-nourished. No distress.  CVS: RRR, S1/S2 + Pulmonary: Effort and breath sounds normal, no stridor, rhonchi, wheezes, rales.  Abdominal: Soft. BS +,  no distension, tenderness, rebound or  guarding.  Musculoskeletal: Normal range of motion. No edema and no tenderness.  Lymphadenopathy: No lymphadenopathy noted, cervical, inguinal. Neuro: Alert. Normal reflexes, muscle tone coordination. Skin: Skin is warm and dry.  Psychiatric: Normal mood and affect.    Data Reviewed: I have personally reviewed following labs and imaging studies  CBC:  Recent Labs Lab 09/25/16 0448 09/26/16 0527 09/27/16 0409 09/29/16 0742 09/30/16 0529  WBC 18.1* 18.0* 18.4* 15.0* 14.4*  NEUTROABS  --   --   --   --  11.9*  HGB 9.4* 9.4* 9.7* 9.9* 10.1*  HCT 30.8* 30.8* 31.4* 32.7* 33.7*  MCV 85.3 84.4 84.9 85.2 85.5  PLT 368 399 431* 435* 341*   Basic Metabolic Panel:  Recent Labs Lab 09/26/16 0527  09/27/16 0409 09/28/16 0417 09/29/16 0742 09/30/16 0529  NA 130* 129* 130* 135 138  K 4.5 5.1 4.8 4.0 3.4*  CL 92* 89* 91* 94* 96*  CO2 26 26 27  32 34*  GLUCOSE 111* 73 89 84 113*  BUN 90* 100* 109* 102* 89*  CREATININE 2.06* 2.08* 2.02* 1.73* 1.41*  CALCIUM 8.3* 8.5* 8.5* 8.5* 8.6*   GFR: Estimated Creatinine Clearance: 42.8 mL/min (A) (by C-G formula based on SCr of 1.41 mg/dL (H)). Liver Function Tests:  Recent Labs Lab 09/26/16 0527 09/27/16 0409 09/28/16 0417 09/29/16 0742 09/30/16 0529  AST 579* 722* 806* 547* 432*  ALT 386* 466* 545* 447* 407*  ALKPHOS 135* 140* 148* 127* 153*  BILITOT 1.7* 1.8* 1.8* 1.5* 1.8*  PROT 6.5 6.6 6.4* 5.9* 6.1*  ALBUMIN 2.6* 2.7* 2.7* 2.5* 2.5*   No results for input(s): LIPASE, AMYLASE in the last 168 hours.  Recent Labs Lab 09/25/16 0448 09/27/16 1150  AMMONIA 31 34   Coagulation Profile:  Recent Labs Lab 09/28/16 0417 09/29/16 0742 09/30/16 0529  INR 2.01 1.85 1.57   Cardiac Enzymes: No results for input(s): CKTOTAL, CKMB, CKMBINDEX, TROPONINI in the last 168 hours. BNP (last 3 results) No results for input(s): PROBNP in the last 8760 hours. HbA1C: No results for input(s): HGBA1C in the last 72 hours. CBG:  Recent Labs Lab 09/29/16 2243 09/30/16 0728 09/30/16 1226 09/30/16 1626 09/30/16 2105  GLUCAP 170* 155* 139* 178* 162*   Lipid Profile: No results for input(s): CHOL, HDL, LDLCALC, TRIG, CHOLHDL, LDLDIRECT in the last 72 hours. Thyroid Function Tests: No results for input(s): TSH, T4TOTAL, FREET4, T3FREE, THYROIDAB in the last 72 hours. Anemia Panel: No results for input(s): VITAMINB12, FOLATE, FERRITIN, TIBC, IRON, RETICCTPCT in the last 72 hours. Urine analysis:    Component Value Date/Time   COLORURINE YELLOW 09/22/2016 1400   APPEARANCEUR CLEAR 09/22/2016 1400   LABSPEC 1.012 09/22/2016 1400   PHURINE 5.0 09/22/2016 1400   GLUCOSEU NEGATIVE 09/22/2016 1400   HGBUR MODERATE (A) 09/22/2016 1400    BILIRUBINUR NEGATIVE 09/22/2016 1400   KETONESUR NEGATIVE 09/22/2016 1400   PROTEINUR 30 (A) 09/22/2016 1400   UROBILINOGEN 0.2 10/26/2008 1315   NITRITE NEGATIVE 09/22/2016 1400   LEUKOCYTESUR NEGATIVE 09/22/2016 1400   Sepsis Labs: @LABRCNTIP (procalcitonin:4,lacticidven:4)    Recent Results (from the past 240 hour(s))  Culture, blood (Routine X 2) w Reflex to ID Panel     Status: None (Preliminary result)   Collection Time: 09/26/16  3:33 PM  Result Value Ref Range Status   Specimen Description BLOOD LEFT ARM  Final   Special Requests IN PEDIATRIC BOTTLE Blood Culture adequate volume  Final   Culture NO GROWTH 4 DAYS  Final   Report Status PENDING  Incomplete  Culture, blood (Routine X 2) w Reflex to ID Panel     Status: None (Preliminary result)   Collection Time: 09/26/16  3:36 PM  Result Value Ref Range Status   Specimen Description BLOOD RIGHT ARM  Final   Special Requests IN PEDIATRIC BOTTLE Blood Culture adequate volume  Final   Culture NO GROWTH 4 DAYS  Final   Report Status PENDING  Incomplete      Radiology Studies: Ct Head Wo Contrast  Result Date: 09/27/2016 CLINICAL DATA:  Acute confusion EXAM: CT HEAD WITHOUT CONTRAST TECHNIQUE: Contiguous axial images were obtained from the base of the skull through the vertex without intravenous contrast. COMPARISON:  08/31/2016 FINDINGS: Brain: Diffuse atrophic changes and chronic white matter ischemic changes are noted. No findings to suggest acute hemorrhage, acute infarction or space-occupying mass lesion are seen. Basal ganglia calcifications are noted bilaterally. Vascular: No hyperdense vessel or unexpected calcification. Skull: Normal. Negative for fracture or focal lesion. Sinuses/Orbits: No acute finding. Other: None. IMPRESSION: Chronic atrophic and ischemic changes stable from the prior exam. No acute abnormality noted. Electronically Signed   By: Inez Catalina M.D.   On: 09/27/2016 14:04        Scheduled  Meds: . enoxaparin (LOVENOX) injection  40 mg Subcutaneous Daily  . feeding supplement (ENSURE ENLIVE)  237 mL Oral BID BM  . furosemide  80 mg Intravenous TID  . insulin aspart  0-9 Units Subcutaneous TID WC  . insulin glargine  8 Units Subcutaneous QHS  . metoprolol succinate  37.5 mg Oral BID  . pantoprazole  40 mg Oral Q0600  . traZODone  100 mg Oral QHS  . vitamin C  500 mg Oral Daily   Continuous Infusions:   LOS: 9 days    Time spent: 25 minutes  Greater than 50% of the time spent on counseling and coordinating the care.   Leisa Lenz, MD [facility] Pager [phone]  If 7PM-7AM, please contact night-coverage [email or web address] Password [facility] 10/01/2016, 7:28 AM

## 2016-10-01 NOTE — Progress Notes (Signed)
Peripherally Inserted Central Catheter/Midline Placement  The IV Nurse has discussed with the patient and/or persons authorized to consent for the patient, the purpose of this procedure and the potential benefits and risks involved with this procedure.  The benefits include less needle sticks, lab draws from the catheter, and the patient may be discharged home with the catheter. Risks include, but not limited to, infection, bleeding, blood clot (thrombus formation), and puncture of an artery; nerve damage and irregular heartbeat and possibility to perform a PICC exchange if needed/ordered by physician.  Alternatives to this procedure were also discussed.  Bard Power PICC patient education guide, fact sheet on infection prevention and patient information card has been provided to patient /or left at bedside. Heather RN spoke with dtr, Rod Holler regarding PICC.  States she and pts son were agreeable to PICC placement.  Pt A&Ox4, some behaviors inappropriate, cooperative, restless, moaning.  Pt signed consent.  PICC/Midline Placement Documentation  PICC Double Lumen 10/01/16 PICC Right Brachial 38 cm 0 cm (Active)  Indication for Insertion or Continuance of Line Vasoactive infusions;Chronic illness with exacerbations (CF, Sickle Cell, etc.);Prolonged intravenous therapies 10/01/2016  3:47 PM  Exposed Catheter (cm) 0 cm 10/01/2016  3:47 PM  Site Assessment Clean;Dry;Intact 10/01/2016  3:47 PM  Lumen #1 Status Flushed;Saline locked;Blood return noted 10/01/2016  3:47 PM  Lumen #2 Status Flushed;Saline locked;Blood return noted 10/01/2016  3:47 PM  Dressing Type Transparent 10/01/2016  3:47 PM  Dressing Status Clean;Dry;Intact;Antimicrobial disc in place 10/01/2016  3:47 PM  Line Care Connections checked and tightened 10/01/2016  3:47 PM  Line Adjustment (NICU/IV Team Only) No 10/01/2016  3:47 PM  Dressing Intervention New dressing 10/01/2016  3:47 PM  Dressing Change Due 10/08/16 10/01/2016  3:47 PM       Rolena Infante 10/01/2016, 3:48 PM

## 2016-10-01 NOTE — Progress Notes (Signed)
Progress Note  Patient Name: Mark Rivers Date of Encounter: 10/01/2016  Primary Cardiologist: Johnsie Cancel  Subjective   Doesn't feel good  Weak  Some SOB    Inpatient Medications    Scheduled Meds: . enoxaparin (LOVENOX) injection  40 mg Subcutaneous Daily  . feeding supplement (ENSURE ENLIVE)  237 mL Oral BID BM  . furosemide  80 mg Intravenous TID  . insulin aspart  0-9 Units Subcutaneous TID WC  . insulin glargine  8 Units Subcutaneous QHS  . metoprolol succinate  37.5 mg Oral BID  . pantoprazole  40 mg Oral Q0600  . traZODone  100 mg Oral QHS  . vitamin C  500 mg Oral Daily   Continuous Infusions:  PRN Meds: acetaminophen, guaiFENesin-dextromethorphan, metoprolol tartrate, ondansetron (ZOFRAN) IV, traMADol   Vital Signs    Vitals:   09/30/16 1300 09/30/16 1609 09/30/16 2011 10/01/16 0602  BP: 113/71 129/71 (!) 144/69 124/72  Pulse: 88 (!) 101 99 98  Resp: 18  18   Temp: (!) 97.3 F (36.3 C)  97.8 F (36.6 C) 97.8 F (36.6 C)  TempSrc: Oral  Oral Oral  SpO2: 98% 98% 97% 98%  Weight:    181 lb (82.1 kg)  Height:        Intake/Output Summary (Last 24 hours) at 10/01/16 0904 Last data filed at 10/01/16 0300  Gross per 24 hour  Intake              480 ml  Output             1550 ml  Net            -1070 ml  Neg 2.4 L  Filed Weights   09/29/16 0534 09/30/16 0600 10/01/16 0602  Weight: 192 lb 14.4 oz (87.5 kg) 190 lb 0.6 oz (86.2 kg) 181 lb (82.1 kg)    Telemetry    Atrial fib   80s  - Personally Reviewed  ECG      Physical Exam  Frail 82 yo in NAD   GEN: No acute distress.   Neck: JVP increased   Cardiac: Irrg irreg  III/VI systolic murmur , rubs, or gallops.  Respiratory: Rhonchi bilaterally   GI: Soft, nontender, non-distended  MS: Trv edema; No deformity. Neuro:  Nonfocal  Psych: Normal affect   Labs    Chemistry Recent Labs Lab 09/28/16 0417 09/29/16 0742 09/30/16 0529  NA 130* 135 138  K 4.8 4.0 3.4*  CL 91* 94* 96*  CO2 27  32 34*  GLUCOSE 89 84 113*  BUN 109* 102* 89*  CREATININE 2.02* 1.73* 1.41*  CALCIUM 8.5* 8.5* 8.6*  PROT 6.4* 5.9* 6.1*  ALBUMIN 2.7* 2.5* 2.5*  AST 806* 547* 432*  ALT 545* 447* 407*  ALKPHOS 148* 127* 153*  BILITOT 1.8* 1.5* 1.8*  GFRNONAA 29* 36* 45*  GFRAA 34* 41* 53*  ANIONGAP 12 9 8      Hematology Recent Labs Lab 09/27/16 0409 09/29/16 0742 09/30/16 0529  WBC 18.4* 15.0* 14.4*  RBC 3.70* 3.84* 3.94*  HGB 9.7* 9.9* 10.1*  HCT 31.4* 32.7* 33.7*  MCV 84.9 85.2 85.5  MCH 26.2 25.8* 25.6*  MCHC 30.9 30.3 30.0  RDW 17.3* 17.2* 17.3*  PLT 431* 435* 413*    Cardiac EnzymesNo results for input(s): TROPONINI in the last 168 hours. No results for input(s): TROPIPOC in the last 168 hours.   BNPNo results for input(s): BNP, PROBNP in the last 168 hours.   DDimer No results  for input(s): DDIMER in the last 168 hours.   Radiology    No results found.  Cardiac Studies     Patient Profile     82 y.o. male hstory of biventricular CHF   By echo 9/15  And atrial fib  Presented with aafib and CHF    Assessment & Plan    1  Acute on chronic systolic and diastolic CHF of both RV and LV  On exam evid of some volume increase  BP is OK  Labs sugg signif RV dysfunction with elevated LFTs   I would recomm a PICC line for Coox measurement  To guide therapy  Will review with Dr Charlies Silvers   2  Atrial fibrillation.  Pt rate controlled  Not on anticoagulation due to esophageal CA  And GI bled  3  CKD  Cr 1.5 yesterday Today is pending    For questions or updates, please contact [facility] Please consult [email or web address] for contact info under Cardiology/STEMI.      Signed, Dorris Carnes, MD  10/01/2016, 9:04 AM

## 2016-10-01 NOTE — Progress Notes (Signed)
GI following peripherally. LFTs continue to downtrend, likely were elevated from congestion related to CHF. Continue to trend LFTs periodically. We will sign off for now, please call with questions.  Sunny Slopes Cellar, MD [facility] Pager [phone]

## 2016-10-02 ENCOUNTER — Inpatient Hospital Stay (HOSPITAL_COMMUNITY): Payer: Medicare Other

## 2016-10-02 DIAGNOSIS — N179 Acute kidney failure, unspecified: Secondary | ICD-10-CM

## 2016-10-02 DIAGNOSIS — Z7189 Other specified counseling: Secondary | ICD-10-CM

## 2016-10-02 DIAGNOSIS — E876 Hypokalemia: Secondary | ICD-10-CM

## 2016-10-02 DIAGNOSIS — I5043 Acute on chronic combined systolic (congestive) and diastolic (congestive) heart failure: Secondary | ICD-10-CM

## 2016-10-02 DIAGNOSIS — I509 Heart failure, unspecified: Secondary | ICD-10-CM

## 2016-10-02 DIAGNOSIS — I4891 Unspecified atrial fibrillation: Secondary | ICD-10-CM

## 2016-10-02 DIAGNOSIS — N189 Chronic kidney disease, unspecified: Secondary | ICD-10-CM

## 2016-10-02 LAB — GLUCOSE, CAPILLARY
GLUCOSE-CAPILLARY: 131 mg/dL — AB (ref 65–99)
GLUCOSE-CAPILLARY: 114 mg/dL — AB (ref 65–99)

## 2016-10-02 LAB — COOXEMETRY PANEL
Carboxyhemoglobin: 1.6 % — ABNORMAL HIGH (ref 0.5–1.5)
Methemoglobin: 1.3 % (ref 0.0–1.5)
O2 Saturation: 66.1 %
Total hemoglobin: 10 g/dL — ABNORMAL LOW (ref 12.0–16.0)

## 2016-10-02 LAB — COMPREHENSIVE METABOLIC PANEL
ALT: 311 U/L — AB (ref 17–63)
AST: 244 U/L — AB (ref 15–41)
Albumin: 2.5 g/dL — ABNORMAL LOW (ref 3.5–5.0)
Alkaline Phosphatase: 144 U/L — ABNORMAL HIGH (ref 38–126)
Anion gap: 8 (ref 5–15)
BILIRUBIN TOTAL: 1.9 mg/dL — AB (ref 0.3–1.2)
BUN: 62 mg/dL — AB (ref 6–20)
CHLORIDE: 95 mmol/L — AB (ref 101–111)
CO2: 39 mmol/L — ABNORMAL HIGH (ref 22–32)
CREATININE: 1.07 mg/dL (ref 0.61–1.24)
Calcium: 8.9 mg/dL (ref 8.9–10.3)
GFR calc Af Amer: >60 mL/min (ref >60)
Glucose, Bld: 128 mg/dL — ABNORMAL HIGH (ref 65–99)
Potassium: 3.4 mmol/L — ABNORMAL LOW (ref 3.5–5.1)
Sodium: 142 mmol/L (ref 135–145)
Total Protein: 5.8 g/dL — ABNORMAL LOW (ref 6.5–8.1)

## 2016-10-02 LAB — CBC
HEMATOCRIT: 34.2 % — AB (ref 39.0–52.0)
Hemoglobin: 10 g/dL — ABNORMAL LOW (ref 13.0–17.0)
MCH: 25.6 pg — ABNORMAL LOW (ref 26.0–34.0)
MCHC: 29.2 g/dL — ABNORMAL LOW (ref 30.0–36.0)
MCV: 87.5 fL (ref 78.0–100.0)
PLATELETS: 392 10*3/uL (ref 150–400)
RBC: 3.91 MIL/uL — AB (ref 4.22–5.81)
RDW: 17.6 % — ABNORMAL HIGH (ref 11.5–15.5)
WBC: 17 10*3/uL — AB (ref 4.0–10.5)

## 2016-10-02 MED ORDER — POTASSIUM CHLORIDE CRYS ER 20 MEQ PO TBCR
40.0000 meq | EXTENDED_RELEASE_TABLET | Freq: Once | ORAL | Status: AC
  Administered 2016-10-02: 40 meq via ORAL
  Filled 2016-10-02: qty 2

## 2016-10-02 MED ORDER — FUROSEMIDE 10 MG/ML IJ SOLN
40.0000 mg | Freq: Three times a day (TID) | INTRAMUSCULAR | Status: DC
  Administered 2016-10-02 – 2016-10-03 (×5): 40 mg via INTRAVENOUS
  Filled 2016-10-02 (×6): qty 4

## 2016-10-02 MED ORDER — SPIRONOLACTONE 25 MG PO TABS
12.5000 mg | ORAL_TABLET | Freq: Every day | ORAL | Status: DC
  Administered 2016-10-02 – 2016-10-05 (×4): 12.5 mg via ORAL
  Filled 2016-10-02 (×4): qty 1

## 2016-10-02 NOTE — Clinical Social Work Note (Signed)
CSW met with patient and his daughter. Patient's daughter discussed that they are now considering him going to SNF here for a short time before they can bring him to [place]. Her sister is familiar with a SNF up there and will get CSW the name of it. Patient shook his head when discussing SNF but after further discussion regarding his inability to ambulate and living alone, he understood that he needed rehab. Patient's daughter concerned about how to get him up there. She has researched that an ambulance ride will cost around $4000. Local SNF list provided. CSW faxed out referral and will provide bed offers once available.  Dayton Scrape, [place]

## 2016-10-02 NOTE — Progress Notes (Signed)
Physical Therapy Treatment Patient Details Name: Mark Rivers MRN: [id number] DOB: [birth year] Today's Date: 10/02/2016    History of Present Illness Mark Rivers is a 82 y.o. male with a hx of CAD s/p CABG 10/2015, persistent AFib not on anticoagulation due to prior GI bleed, esophageal adenocarcinoma, DM2, CKD, HTN, HL, COPD, and chronic diastolic CHF, Afib RVR and CHF, FTT with multiple hospitalizations    PT Comments    Patient required max A +2 for functional transfers and declined attempts to ambulate. Continue to progress as tolerated with anticipated d/c to SNF for further skilled PT services.     Follow Up Recommendations  SNF;Other (comment)     Equipment Recommendations  None recommended by PT    Recommendations for Other Services       Precautions / Restrictions Precautions Precautions: Fall Restrictions Weight Bearing Restrictions: No    Mobility  Bed Mobility               General bed mobility comments: Pt in recliner upon arrival  Transfers Overall transfer level: Needs assistance Equipment used: Rolling walker (2 wheeled) Transfers: Sit to/from Stand Sit to Stand: Max assist;+2 physical assistance;From elevated surface         General transfer comment: Pt performed sit<>stand x3 with Max A +2 and Max cues; pt encouraged to stay standing and ambulate however pt stood briefly each trial and then sat without much warning  Ambulation/Gait                 Stairs            Wheelchair Mobility    Modified Rankin (Stroke Patients Only)       Balance Overall balance assessment: Needs assistance;History of Falls Sitting-balance support: Bilateral upper extremity supported;Feet supported Sitting balance-Leahy Scale: Poor Sitting balance - Comments: posterior lean initially and then min guard for safety Postural control: Posterior lean Standing balance support: Bilateral upper extremity supported Standing balance-Leahy Scale:  Poor Standing balance comment: unable to stand with RW or stedy this visit                            Cognition Arousal/Alertness: Awake/alert Behavior During Therapy: Restless Overall Cognitive Status: Impaired/Different from baseline Area of Impairment: Following commands;Safety/judgement;Problem solving;Attention                 Orientation Level: Disoriented to;Time;Situation Current Attention Level: Sustained Memory: Decreased short-term memory Following Commands: Follows one step commands with increased time;Follows one step commands inconsistently (and multimodal cues) Safety/Judgement: Decreased awareness of safety;Decreased awareness of deficits   Problem Solving: Slow processing;Decreased initiation;Requires verbal cues;Requires tactile cues;Difficulty sequencing General Comments: Pt demonstrating decreased cognitition and problem solving. Pt with limited attention and requires clear motivator to perform tasks. Pt calling out in pain and then smiles when he does not want to move.       Exercises General Exercises - Upper Extremity Shoulder Flexion: AROM;Both;5 reps;Seated (required Max visual and verbal cues) Shoulder Extension: AROM;Both;5 reps;Seated (required Max visual and verbal cues)    General Comments General comments (skin integrity, edema, etc.): daughter present end of session      Pertinent Vitals/Pain Pain Assessment: Faces Faces Pain Scale: Hurts little more Pain Location: hips Pain Descriptors / Indicators: Grimacing;Guarding;Moaning;Sore ("hurts!") Pain Intervention(s): Monitored during session;Limited activity within patient's tolerance;Repositioned    Home Living  Prior Function            PT Goals (current goals can now be found in the care plan section) Acute Rehab PT Goals Patient Stated Goal: did not state Progress towards PT goals: Progressing toward goals    Frequency    Min  3X/week      PT Plan Current plan remains appropriate    Co-evaluation PT/OT/SLP Co-Evaluation/Treatment: Yes Reason for Co-Treatment: For patient/therapist safety;To address functional/ADL transfers PT goals addressed during session: Mobility/safety with mobility OT goals addressed during session: ADL's and self-care      AM-PAC PT "6 Clicks" Daily Activity  Outcome Measure  Difficulty turning over in bed (including adjusting bedclothes, sheets and blankets)?: A Lot Difficulty moving from lying on back to sitting on the side of the bed? : Unable Difficulty sitting down on and standing up from a chair with arms (e.g., wheelchair, bedside commode, etc,.)?: Unable Help needed moving to and from a bed to chair (including a wheelchair)?: Total Help needed walking in hospital room?: Total Help needed climbing 3-5 steps with a railing? : Total 6 Click Score: 7    End of Session Equipment Utilized During Treatment: Gait belt;Oxygen Activity Tolerance: Patient limited by pain Patient left: in chair;with call bell/phone within reach;with chair alarm set;with family/visitor present Nurse Communication: Mobility status PT Visit Diagnosis: Unsteadiness on feet (R26.81);Muscle weakness (generalized) (M62.81)     Time: 2585-2778 PT Time Calculation (min) (ACUTE ONLY): 28 min  Charges:  $Therapeutic Activity: 8-22 mins                    G Codes:       Earney Navy, PTA Pager: [phone]     Darliss Cheney 10/02/2016, 1:42 PM

## 2016-10-02 NOTE — NC FL2 (Signed)
[place] LEVEL OF CARE SCREENING TOOL     IDENTIFICATION  Patient Name: Mark Rivers Birthdate: 1934-02-18 Sex: male Admission Date (Current Location): 09/21/2016  [facility] and [place] Number:  Herbalist and Address:  [facility], [place] [address] [place], [place] [address]      Provider Number: [id number]  Attending Physician Name and Address:  Robbie Lis, MD  Relative Name and Phone Number:       Current Level of Care: Hospital Recommended Level of Care: [place] Prior Approval Number:    Date Approved/Denied:   PASRR Number: [id number]  Discharge Plan: SNF    Current Diagnoses: Patient Active Problem List   Diagnosis Date Noted  . Abnormal liver function tests   . Transient alteration of awareness   . Palliative care by specialist   . DNR (do not resuscitate)   . Acute renal failure ([place])   . Atrial fibrillation with RVR ([place])   . Acute on chronic respiratory failure with hypoxia ([place]) 09/22/2016  . Hypoxemia 06/21/2016  . Physical deconditioning 06/21/2016  . Hypokalemia 05/08/2016  . Vertigo   . Acute on chronic respiratory failure ([place]) 05/07/2016  . 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 Malignant neoplasm of stomach ([place]) 02/22/2016  . Upper GI bleed   . Duodenitis determined by biopsy   . Panlobular emphysema ([place])   . Essential hypertension   . Controlled diabetes mellitus type 2 with complications ([place])   . Influenza A 02/11/2016  . Duodenitis:Per EGD 02/11/2016 02/11/2016  . Warfarin-induced coagulopathy ([place])   . Malnutrition of moderate degree 02/10/2016  . Anemia of chronic disease 02/09/2016  . GI bleed 02/09/2016  . Dyspnea on exertion 01/09/2016  . Acute on chronic diastolic congestive heart failure ([place]) 12/21/2015  . Pressure injury of skin 11/17/2015  . Surgery, elective   . Coronary artery disease involving native coronary artery of native heart without angina pectoris  10/27/2015  . S/P CABG x 3   . History of MI (myocardial infarction)   . Persistent atrial fibrillation ([place]) 10/25/2015  . Seasonal allergic conjunctivitis 08/25/2015  . History of tobacco abuse 08/25/2015  . Diabetes mellitus ([place]) 03/11/2015  . Diabetes mellitus with stage 3 chronic kidney disease ([place]) 03/11/2015  . Metabolic bone disease 66/06/3014  . Vitamin D deficiency 03/11/2015  . Atopic dermatitis 02/24/2015  . Hip pain 09/24/2014  . Need for immunization against influenza 09/24/2014  . Benign essential hypertension 03/13/2014  . Generalized osteoarthritis of multiple sites 03/13/2014  . CKD (chronic kidney disease) stage 3, GFR 30-59 ml/min 03/04/2014  . Acute stress disorder 11/12/2013  . Seasonal and perennial allergic rhinitis 11/12/2013  . Allergic urticaria 11/12/2013  . Benign neoplasm of colon 11/12/2013  . Causalgia of upper extremity 11/12/2013  . Cervical radiculopathy 11/12/2013  . Neck pain 11/12/2013  . Weakness generalized 11/12/2013  . Hearing loss 11/12/2013  . Insomnia 11/12/2013  . Obesity 11/12/2013  . Premature ventricular contractions 11/12/2013  . Psychosexual dysfunction with inhibited sexual excitement 11/12/2013  . Hyperlipemia 09/11/2013  . Type 2 diabetes mellitus, uncontrolled ([place]) 09/11/2013  . Pulmonary emphysema ([place]) 04/22/2013  . Esophageal reflux   . Benign hypertension with chronic kidney disease, stage III   . Allergy   . Neuropathy ([place])     Orientation RESPIRATION BLADDER Height & Weight     Self, Time, Place  O2 (Nasal Canula 3 L) Incontinent, External catheter Weight: 181 lb 4.8 oz (82.2  kg) Height:  5\' 7"  (170.2 cm)  BEHAVIORAL SYMPTOMS/MOOD NEUROLOGICAL BOWEL NUTRITION STATUS   (Restless, Yells out)  (None) Continent Diet (Heart healthy/carb modified)  AMBULATORY STATUS COMMUNICATION OF NEEDS Skin   Extensive Assist Verbally Bruising, Other (Comment) (MASD.)                       Personal Care Assistance  Level of Assistance  Bathing, Feeding, Dressing Bathing Assistance: Maximum assistance Feeding assistance: Limited assistance Dressing Assistance: Maximum assistance     Functional Limitations Info  Sight, Hearing, Speech Sight Info: Adequate Hearing Info: Adequate Speech Info: Adequate    SPECIAL CARE FACTORS FREQUENCY  PT (By licensed PT), Blood pressure, OT (By licensed OT)     PT Frequency: 5 x week OT Frequency: 5 x week            Contractures Contractures Info: Not present    Additional Factors Info  Code Status, Allergies Code Status Info: DNR Allergies Info: Amoxicillin, Cephalexin, Penicillin G           Current Medications (10/02/2016):  This is the current hospital active medication list Current Facility-Administered Medications  Medication Dose Route Frequency Provider Last Rate Last Dose  . acetaminophen (TYLENOL) tablet 500 mg  500 mg Oral Q6H PRN Modena Jansky, MD   500 mg at 10/01/16 0201  . enoxaparin (LOVENOX) injection 40 mg  40 mg Subcutaneous Daily Modena Jansky, MD   40 mg at 10/01/16 4008  . feeding supplement (ENSURE ENLIVE) (ENSURE ENLIVE) liquid 237 mL  237 mL Oral BID BM Robbie Lis, MD   237 mL at 10/01/16 1602  . furosemide (LASIX) injection 80 mg  80 mg Intravenous TID Florencia Reasons, MD   80 mg at 10/01/16 2137  . guaiFENesin-dextromethorphan (ROBITUSSIN DM) 100-10 MG/5ML syrup 5 mL  5 mL Oral Q4H PRN Florencia Reasons, MD   5 mL at 10/01/16 1740  . insulin aspart (novoLOG) injection 0-9 Units  0-9 Units Subcutaneous TID WC Norval Morton, MD   1 Units at 10/02/16 [phone]  . insulin glargine (LANTUS) injection 8 Units  8 Units Subcutaneous QHS Tawni Millers, MD   8 Units at 10/01/16 2140  . metoprolol succinate (TOPROL-XL) 24 hr tablet 37.5 mg  37.5 mg Oral BID Pixie Casino, MD   37.5 mg at 10/01/16 2137  . metoprolol tartrate (LOPRESSOR) injection 5 mg  5 mg Intravenous Q4H PRN Thurnell Lose, MD   5 mg at 09/26/16 9509  .  ondansetron (ZOFRAN) injection 4 mg  4 mg Intravenous Q6H PRN Smith, Rondell A, MD      . pantoprazole (PROTONIX) EC tablet 40 mg  40 mg Oral Q0600 Fuller Plan A, MD   40 mg at 10/02/16 3267  . sodium chloride flush (NS) 0.9 % injection 10-40 mL  10-40 mL Intracatheter PRN Robbie Lis, MD      . traMADol Veatrice Bourbon) tablet 50 mg  50 mg Oral Q6H PRN Fuller Plan A, MD   50 mg at 10/01/16 1740  . traZODone (DESYREL) tablet 100 mg  100 mg Oral QHS Smith, Rondell A, MD   100 mg at 10/01/16 2137  . vitamin C (ASCORBIC ACID) tablet 500 mg  500 mg Oral Daily Fuller Plan A, MD   500 mg at 10/01/16 1245     Discharge Medications: Please see discharge summary for a list of discharge medications.  Relevant Imaging Results:  Relevant Lab Results:  Additional Information SS#: [id number]. Children hope to move him to [place] where they live once he is stronger.  Candie Chroman, LCSW

## 2016-10-02 NOTE — Progress Notes (Signed)
Patient ID: Mark Rivers, male   DOB: 1934/02/11, 82 y.o.   MRN: [id number]  PROGRESS NOTE    Mark Rivers  GGY:[id number] DOB: [birth year] DOA: 09/21/2016  PCP: [name], MD   Brief Narrative:  82 year old male with atrial fibrillation, CAD, CVA, diastolic CHF, DM, esophageal carcinoma, recent hospitalization for decompensated CHF (8/23 - 8/25), has been experiencing LE edema since discharge. He was admitted for acute decompensated CHF. Seen by cardio in consultation.  GI consulted for abnormal LFT's.   Assessment & Plan:   Principal Problem:   Acute on chronic respiratory failure with hypoxia (HCC) / Acute on chronic diastolic congestive heart failure (HCC) - Continue lasix 80 mg IV TID - Continue daily weight and strict intake and output - LVEF reduced down to 35-40% (previosuly normal 55-60%) - PICC placed for CooX measurement to help guid the therpay - He is negative 3.9 L  Active Problems:   Diabetes mellitus with stage 3 chronic kidney disease (HCC) - Continue Lantus 8 units at bedtime and sliding scale insulin - CBG's in past 24 hours: 160, 172, 131    Abnormal LFT's - Suspected passive congestion from CHF - GI signed off 9/23 - US unremarkable - Continue to trend LFT's    Persistent atrial fibrillation (HCC) - CHA2DS2-VASc Score 3 - Not on AC due to esophageal ca and prior GI bleed  - Continue metoprolol for rate control     Acute kidney failure superimposed on CKD stage 4 - Cr now WNL    Essential hypertension - Continue metoprolol     Hypokalemia - Supplemented  - Follow up BMP in am    Anemia of chronic disease - Likely due to esophageal adenocarcinoma - Hemoglobin is stable at 10    Esophageal adenocarcinoma - Palliative consulted for goals of care    DVT prophylaxis: Lovenox subQ Code Status: DNR/DNI Family Communication: no family at the bedside  Disposition Plan: not yet stable for discharge    Consultants:    Gastroenterology, Dr. Enis Gash  Cardiology   Palliative care   Procedures:   ECHO 9/15 - EF 35-40%  PICC placed 9/23  Antimicrobials:   None    Subjective: No overnight events,  Objective: Vitals:   10/01/16 1938 10/02/16 0500 10/02/16 1000 10/02/16 1009  BP: 119/61 130/65 (!) 108/52   Pulse: (!) 105 86 96   Resp: 18 18 18    Temp: 98.4 F (36.9 C) 97.8 F (36.6 C) 97.7 F (36.5 C)   TempSrc: Oral Oral Oral   SpO2: 93% 94% (!) 78% 99%  Weight:  82.2 kg (181 lb 4.8 oz)    Height:        Intake/Output Summary (Last 24 hours) at 10/02/16 1228 Last data filed at 10/02/16 1130  Gross per 24 hour  Intake              780 ml  Output             1950 ml  Net            -1170 ml   Filed Weights   09/30/16 0600 10/01/16 0602 10/02/16 0500  Weight: 86.2 kg (190 lb 0.6 oz) 82.1 kg (181 lb) 82.2 kg (181 lb 4.8 oz)    Physical Exam  Constitutional: Appears well-developed and well-nourished. No distress.  CVS: RRR, S1/S2 +, JVP increased, has 3/6 systolic murmur Pulmonary: rhonchi bilaterally  Abdominal: Soft. BS +,  no distension, tenderness, rebound or guarding.  Musculoskeletal:  Normal range of motion. (+) edema Neuro: More alert this am Skin: Skin is warm and dry.  Psychiatric: Normal mood and affect.     Data Reviewed: I have personally reviewed following labs and imaging studies  CBC:  Recent Labs Lab 09/26/16 0527 09/27/16 0409 09/29/16 0742 09/30/16 0529 10/02/16 0451  WBC 18.0* 18.4* 15.0* 14.4* 17.0*  NEUTROABS  --   --   --  11.9*  --   HGB 9.4* 9.7* 9.9* 10.1* 10.0*  HCT 30.8* 31.4* 32.7* 33.7* 34.2*  MCV 84.4 84.9 85.2 85.5 87.5  PLT 399 431* 435* 413* 751   Basic Metabolic Panel:  Recent Labs Lab 09/27/16 0409 09/28/16 0417 09/29/16 0742 09/30/16 0529 10/02/16 0451  NA 129* 130* 135 138 142  K 5.1 4.8 4.0 3.4* 3.4*  CL 89* 91* 94* 96* 95*  CO2 26 27 32 34* 39*  GLUCOSE 73 89 84 113* 128*  BUN 100* 109* 102* 89* 62*   CREATININE 2.08* 2.02* 1.73* 1.41* 1.07  CALCIUM 8.5* 8.5* 8.5* 8.6* 8.9   GFR: Estimated Creatinine Clearance: 56.5 mL/min (by C-G formula based on SCr of 1.07 mg/dL). Liver Function Tests:  Recent Labs Lab 09/27/16 0409 09/28/16 0417 09/29/16 0742 09/30/16 0529 10/02/16 0451  AST 722* 806* 547* 432* 244*  ALT 466* 545* 447* 407* 311*  ALKPHOS 140* 148* 127* 153* 144*  BILITOT 1.8* 1.8* 1.5* 1.8* 1.9*  PROT 6.6 6.4* 5.9* 6.1* 5.8*  ALBUMIN 2.7* 2.7* 2.5* 2.5* 2.5*   No results for input(s): LIPASE, AMYLASE in the last 168 hours.  Recent Labs Lab 09/27/16 1150  AMMONIA 34   Coagulation Profile:  Recent Labs Lab 09/28/16 0417 09/29/16 0742 09/30/16 0529 10/01/16 0731  INR 2.01 1.85 1.57 1.39   Cardiac Enzymes: No results for input(s): CKTOTAL, CKMB, CKMBINDEX, TROPONINI in the last 168 hours. BNP (last 3 results) No results for input(s): PROBNP in the last 8760 hours. HbA1C: No results for input(s): HGBA1C in the last 72 hours. CBG:  Recent Labs Lab 10/01/16 0729 10/01/16 1129 10/01/16 1628 10/01/16 2050 10/02/16 0725  GLUCAP 129* 191* 160* 172* 131*   Lipid Profile: No results for input(s): CHOL, HDL, LDLCALC, TRIG, CHOLHDL, LDLDIRECT in the last 72 hours. Thyroid Function Tests: No results for input(s): TSH, T4TOTAL, FREET4, T3FREE, THYROIDAB in the last 72 hours. Anemia Panel: No results for input(s): VITAMINB12, FOLATE, FERRITIN, TIBC, IRON, RETICCTPCT in the last 72 hours. Urine analysis:    Component Value Date/Time   COLORURINE YELLOW 09/22/2016 1400   APPEARANCEUR CLEAR 09/22/2016 1400   LABSPEC 1.012 09/22/2016 1400   PHURINE 5.0 09/22/2016 1400   GLUCOSEU NEGATIVE 09/22/2016 1400   HGBUR MODERATE (A) 09/22/2016 1400   BILIRUBINUR NEGATIVE 09/22/2016 1400   KETONESUR NEGATIVE 09/22/2016 1400   PROTEINUR 30 (A) 09/22/2016 1400   UROBILINOGEN 0.2 10/26/2008 1315   NITRITE NEGATIVE 09/22/2016 1400   LEUKOCYTESUR NEGATIVE 09/22/2016  1400   Sepsis Labs: @LABRCNTIP (procalcitonin:4,lacticidven:4)    Recent Results (from the past 240 hour(s))  Culture, blood (Routine X 2) w Reflex to ID Panel     Status: None   Collection Time: 09/26/16  3:33 PM  Result Value Ref Range Status   Specimen Description BLOOD LEFT ARM  Final   Special Requests IN PEDIATRIC BOTTLE Blood Culture adequate volume  Final   Culture NO GROWTH 5 DAYS  Final   Report Status 10/01/2016 FINAL  Final  Culture, blood (Routine X 2) w Reflex to ID Panel  Status: None   Collection Time: 09/26/16  3:36 PM  Result Value Ref Range Status   Specimen Description BLOOD RIGHT ARM  Final   Special Requests IN PEDIATRIC BOTTLE Blood Culture adequate volume  Final   Culture NO GROWTH 5 DAYS  Final   Report Status 10/01/2016 FINAL  Final      Radiology Studies: Dg Chest Port 1 View  Result Date: 10/01/2016 CLINICAL DATA:  Central line placement EXAM: PORTABLE CHEST 1 VIEW COMPARISON:  09/26/2016 FINDINGS: Right arm PICC tip has been placed with the tip in the SVC. Bibasilar airspace disease and bilateral effusions unchanged from prior exam. Prior CABG IMPRESSION: Right arm PICC tip in the SVC in good position Bibasilar atelectasis and effusion unchanged. Electronically Signed   By: Franchot Gallo M.D.   On: 10/01/2016 16:24        Scheduled Meds: . enoxaparin (LOVENOX) injection  40 mg Subcutaneous Daily  . feeding supplement (ENSURE ENLIVE)  237 mL Oral BID BM  . furosemide  80 mg Intravenous TID  . insulin aspart  0-9 Units Subcutaneous TID WC  . insulin glargine  8 Units Subcutaneous QHS  . metoprolol succinate  37.5 mg Oral BID  . pantoprazole  40 mg Oral Q0600  . traZODone  100 mg Oral QHS  . vitamin C  500 mg Oral Daily   Continuous Infusions:   LOS: 10 days    Time spent: 25 minutes  Greater than 50% of the time spent on counseling and coordinating the care.   Leisa Lenz, MD [facility] Pager [phone]  If 7PM-7AM,  please contact night-coverage [email or web address] Password [facility] 10/02/2016, 12:28 PM

## 2016-10-02 NOTE — Progress Notes (Signed)
Occupational Therapy Treatment Patient Details Name: Mark Rivers MRN: [id number] DOB: 1934-05-04 Today's Date: 10/02/2016    History of present illness Mark Rivers is a 82 y.o. male with a hx of CAD s/p CABG 10/2015, persistent AFib not on anticoagulation due to prior GI bleed, esophageal adenocarcinoma, DM2, CKD, HTN, HL, COPD, and chronic diastolic CHF, Afib RVR and CHF, FTT with multiple hospitalizations   OT comments  Pt continuing to demonstrate decreased cogitation as well as occupational performance and participation. Pt requiring Max A +2 and RW for sit<>stand and would decline to take any steps for functional mobility. Pt requiring Max simple, direct VCs to participate in exercises and functional transfer. Continues to recommend dc to SNF for further OT to optimize safety and independence with ADLs and functional mobility.  Will continues to follow acutely to facilitate safe dc.    Follow Up Recommendations  SNF;Supervision/Assistance - 24 hour    Equipment Recommendations  Other (comment) (TBD at next venue)    Recommendations for Other Services      Precautions / Restrictions Precautions Precautions: Fall Restrictions Weight Bearing Restrictions: No       Mobility Bed Mobility               General bed mobility comments: Pt in recliner upon arrival  Transfers Overall transfer level: Needs assistance Equipment used: Rolling walker (2 wheeled) Transfers: Sit to/from Stand Sit to Stand: Max assist;+2 physical assistance;From elevated surface         General transfer comment: Pt performed sit<>stand x3 with Max A +2 and Max cues.     Balance Overall balance assessment: Needs assistance;History of Falls Sitting-balance support: Bilateral upper extremity supported;Feet supported Sitting balance-Leahy Scale: Poor Sitting balance - Comments: posterior lean initially and then min guard for safety Postural control: Posterior lean Standing balance  support: Bilateral upper extremity supported Standing balance-Leahy Scale: Zero Standing balance comment: unable to stand with RW or stedy this visit                           ADL either performed or assessed with clinical judgement   ADL Overall ADL's : Needs assistance/impaired                                     Functional mobility during ADLs: Rolling walker;Maximal assistance;+2 for physical assistance General ADL Comments: Pt declining ADLs. Performed sit<>stand x3 with Max A +2 and Max VCs for encouragement. Pt requiring simple direct cues.  NT and mobility tech reporting that pt will walk around unit.  Feel pt's occuaptional performance and particiaption is significantly limited be behavioral and cognititve limitations.      Vision       Perception     Praxis      Cognition Arousal/Alertness: Awake/alert Behavior During Therapy: Restless Overall Cognitive Status: Impaired/Different from baseline Area of Impairment: Following commands;Safety/judgement;Problem solving;Attention                 Orientation Level: Disoriented to;Time;Situation Current Attention Level: Sustained Memory: Decreased short-term memory Following Commands: Follows one step commands with increased time;Follows one step commands inconsistently (and multimodal cues) Safety/Judgement: Decreased awareness of safety;Decreased awareness of deficits   Problem Solving: Slow processing;Decreased initiation;Requires verbal cues;Requires tactile cues;Difficulty sequencing General Comments: Pt demonstrating decreased cognitition and problem solving. Pt with limited attention and requires clear motivator to perform tasks. Pt  calling out in pain and then smiles when he does not want to move.         Exercises Exercises: General Upper Extremity General Exercises - Upper Extremity Shoulder Flexion: AROM;Both;5 reps;Seated (required Max visual and verbal cues) Shoulder  Extension: AROM;Both;5 reps;Seated (required Max visual and verbal cues)   Shoulder Instructions       General Comments Daughter present at end of session    Pertinent Vitals/ Pain       Pain Assessment: Faces Faces Pain Scale: Hurts little more Pain Location: hips Pain Descriptors / Indicators: Grimacing;Guarding;Moaning;Sore ("hurts!") Pain Intervention(s): Monitored during session;Limited activity within patient's tolerance;Repositioned  Home Living                                          Prior Functioning/Environment              Frequency  Min 2X/week        Progress Toward Goals  OT Goals(current goals can now be found in the care plan section)  Progress towards OT goals: Progressing toward goals  Acute Rehab OT Goals Patient Stated Goal: did not state OT Goal Formulation: With patient Time For Goal Achievement: 10/09/16 Potential to Achieve Goals: Good ADL Goals Pt Will Perform Grooming: with min guard assist;sitting Pt Will Perform Upper Body Bathing: with min guard assist;sitting Pt Will Perform Lower Body Bathing: with min assist;sit to/from stand Pt Will Transfer to Toilet: with min assist;ambulating;bedside commode Pt Will Perform Toileting - Clothing Manipulation and hygiene: with min assist;sit to/from stand Additional ADL Goal #1: Pt will independently verbalize 3 energy conservation techniques and utilize during session as needed. Additional ADL Goal #2: Pt will independently verbalize 3 safety strategies for home.  Plan Discharge plan remains appropriate    Co-evaluation    PT/OT/SLP Co-Evaluation/Treatment: Yes Reason for Co-Treatment: For patient/therapist safety PT goals addressed during session: Mobility/safety with mobility OT goals addressed during session: ADL's and self-care      AM-PAC PT "6 Clicks" Daily Activity     Outcome Measure   Help from another person eating meals?: A Little Help from another person  taking care of personal grooming?: A Lot Help from another person toileting, which includes using toliet, bedpan, or urinal?: Total Help from another person bathing (including washing, rinsing, drying)?: A Lot Help from another person to put on and taking off regular upper body clothing?: A Lot Help from another person to put on and taking off regular lower body clothing?: Total 6 Click Score: 11    End of Session Equipment Utilized During Treatment: Gait belt;Rolling walker;Oxygen  OT Visit Diagnosis: Unsteadiness on feet (R26.81);Other abnormalities of gait and mobility (R26.89);History of falling (Z91.81);Muscle weakness (generalized) (M62.81)   Activity Tolerance Patient limited by fatigue;Patient limited by pain (Limited by decreased motivation)   Patient Left in chair;with call bell/phone within reach;with chair alarm set   Nurse Communication Need for lift equipment        Time: 5732-2025 OT Time Calculation (min): 26 min  Charges: OT General Charges $OT Visit: 1 Visit OT Treatments $Therapeutic Activity: 8-22 mins  [place], OTR/L Acute Rehab Pager: [phone] Office: [place] 10/02/2016, 1:26 PM

## 2016-10-02 NOTE — Progress Notes (Signed)
Progress Note  Patient Name: Bentleigh Stankus Date of Encounter: 10/02/2016  Primary Cardiologist: Dr. Johnsie Cancel   Subjective   Pt's daughter is by bedside. She is here from [place] and is doing much of the talking. She reports that he appears a bit more groggy. A bit confused at times. Not at his baseline. He appears comfortable at rest. No increased work of breathing. In no acute distress.   Inpatient Medications    Scheduled Meds: . enoxaparin (LOVENOX) injection  40 mg Subcutaneous Daily  . feeding supplement (ENSURE ENLIVE)  237 mL Oral BID BM  . furosemide  80 mg Intravenous TID  . insulin aspart  0-9 Units Subcutaneous TID WC  . insulin glargine  8 Units Subcutaneous QHS  . metoprolol succinate  37.5 mg Oral BID  . pantoprazole  40 mg Oral Q0600  . potassium chloride  40 mEq Oral Once  . traZODone  100 mg Oral QHS  . vitamin C  500 mg Oral Daily   Continuous Infusions:  PRN Meds: acetaminophen, guaiFENesin-dextromethorphan, metoprolol tartrate, ondansetron (ZOFRAN) IV, sodium chloride flush, traMADol   Vital Signs    Vitals:   10/01/16 1938 10/02/16 0500 10/02/16 1000 10/02/16 1009  BP: 119/61 130/65 (!) 108/52   Pulse: (!) 105 86 96   Resp: 18 18 18    Temp: 98.4 F (36.9 C) 97.8 F (36.6 C) 97.7 F (36.5 C)   TempSrc: Oral Oral Oral   SpO2: 93% 94% (!) 78% 99%  Weight:  181 lb 4.8 oz (82.2 kg)    Height:        Intake/Output Summary (Last 24 hours) at 10/02/16 1248 Last data filed at 10/02/16 1130  Gross per 24 hour  Intake              780 ml  Output             1950 ml  Net            -1170 ml   Filed Weights   09/30/16 0600 10/01/16 0602 10/02/16 0500  Weight: 190 lb 0.6 oz (86.2 kg) 181 lb (82.1 kg) 181 lb 4.8 oz (82.2 kg)    Telemetry    afib in the low 100s.- Personally Reviewed  ECG    09/21/16 atrial fibrillation w/ RVR 145 bpm - Personally Reviewed  Physical Exam   GEN: No acute distress.   Neck: No JVD Cardiac: irreguarlly irregular,  slightly tachy rate, no murmurs, rubs, or gallops.  Respiratory: decreased BS at the bases with faint basilar rales on the right GI: Soft, nontender, non-distended  MS: bilateral compression stocking with 1+ pretibial edema; No deformity. Neuro:  Nonfocal  Psych: Normal affect   Labs    Chemistry  Recent Labs Lab 09/29/16 0742 09/30/16 0529 10/02/16 0451  NA 135 138 142  K 4.0 3.4* 3.4*  CL 94* 96* 95*  CO2 32 34* 39*  GLUCOSE 84 113* 128*  BUN 102* 89* 62*  CREATININE 1.73* 1.41* 1.07  CALCIUM 8.5* 8.6* 8.9  PROT 5.9* 6.1* 5.8*  ALBUMIN 2.5* 2.5* 2.5*  AST 547* 432* 244*  ALT 447* 407* 311*  ALKPHOS 127* 153* 144*  BILITOT 1.5* 1.8* 1.9*  GFRNONAA 36* 45* >60  GFRAA 41* 53* >60  ANIONGAP 9 8 8      Hematology  Recent Labs Lab 09/29/16 0742 09/30/16 0529 10/02/16 0451  WBC 15.0* 14.4* 17.0*  RBC 3.84* 3.94* 3.91*  HGB 9.9* 10.1* 10.0*  HCT 32.7* 33.7* 34.2*  MCV 85.2 85.5 87.5  MCH 25.8* 25.6* 25.6*  MCHC 30.3 30.0 29.2*  RDW 17.2* 17.3* 17.6*  PLT 435* 413* 392    Cardiac EnzymesNo results for input(s): TROPONINI in the last 168 hours. No results for input(s): TROPIPOC in the last 168 hours.   BNPNo results for input(s): BNP, PROBNP in the last 168 hours.   DDimer No results for input(s): DDIMER in the last 168 hours.   Radiology    Dg Chest Port 1 View  Result Date: 10/02/2016 CLINICAL DATA:  Cough.  Dyspnea.  Hypoxia. EXAM: PORTABLE CHEST 1 VIEW COMPARISON:  10/01/2016 chest radiograph. FINDINGS: Right PICC terminates in the lower third of the superior vena cava. Intact sternotomy wires. Stable cardiomediastinal silhouette with mild cardiomegaly. No pneumothorax. Stable small bilateral pleural effusions and hazy bibasilar lung opacities. No overt pulmonary edema. IMPRESSION: 1. Stable mild cardiomegaly without overt pulmonary edema. 2. Stable small bilateral pleural effusions with hazy bibasilar lung opacities, favor atelectasis. Electronically Signed    By: Ilona Sorrel M.D.   On: 10/02/2016 12:28   Dg Chest Port 1 View  Result Date: 10/01/2016 CLINICAL DATA:  Central line placement EXAM: PORTABLE CHEST 1 VIEW COMPARISON:  09/26/2016 FINDINGS: Right arm PICC tip has been placed with the tip in the SVC. Bibasilar airspace disease and bilateral effusions unchanged from prior exam. Prior CABG IMPRESSION: Right arm PICC tip in the SVC in good position Bibasilar atelectasis and effusion unchanged. Electronically Signed   By: Franchot Gallo M.D.   On: 10/01/2016 16:24    Cardiac Studies   Study Conclusions  - Left ventricle: The cavity size was normal. Wall thickness was   increased in a pattern of mild LVH. Systolic function was   moderately reduced. The estimated ejection fraction was in the   range of 35% to 40%. Diffuse hypokinesis. The study is not   technically sufficient to allow evaluation of LV diastolic   function. - Aortic valve: Moderately to severely calcified annulus. Mildly   thickened, mildly calcified leaflets. Morphologically, it appears   there is at least mild if not mild to moderate aortic valvular   stenosis. - Mitral valve: Calcified annulus. There was moderate to severe   regurgitation. - Left atrium: The atrium was severely dilated. - Right ventricle: The cavity size was moderately dilated. Systolic   function was severely reduced. - Right atrium: The atrium was moderately dilated. - Atrial septum: No defect or patent foramen ovale was identified. - Tricuspid valve: There was moderate regurgitation.   Patient Profile     82 y.o. male with a hx of CAD s/p CABG 10/2015, persistent AFib not on anticoagulation due to prior GI bleed, esophageal adenocarcinoma, DM2, CKD, HTN, HL, COPD, and chronic diastolic CHF admitted for acute HF in the setting of afib w/ RVR. Echo with reduced EF, down to 35-40% (previously normal). Also with right sided HF. No plans for ischemic w/u given multiple comorbidities including  esophageal cancer and stage IV CKD.   Assessment & Plan    1. Acute on Chronic Systolic and Diastolic HF/ Biventricular HF:  Labs suggestive of significant RV dysfunction w/ elevated LFTs (hepatic congestion). LVEF reduced, down to 35-40% (previously normal 55-60% 04/2016). RV is also moderately dilated and RV systolic function severely reduced by echo. Dr. Harrington Challenger recommended PICC line placement for CooX measurement to help guide therapy. Carboxyhemoglobin yesterday was 1.5%. MD to further advise. Continue IV Lasix for now, 80 mg TID. I/Os net negative 3.9L. He diuresed 2.4L  out yesterday. Still with mild volume overload. Continue strict I/Os for monitoring.    2. Atrial Fibrillation: rate has improved since admission but still in the low 100s resting. Continue BB therapy with metoprolol succinate. He is not on [facility] given esophageal cancer and h/o GIB. Continue to monitor on telemetry. Correct hypokalemia with supplemental K. Monitor.   3. Acute on Chronic Kidney Disease: Stage IV CKD at baseline. Acute on chronic kidney injury. SCr as high as 2.6 this admit, but has improved. SCr 1.07 today.   4. Hypokalemia: K 3.4. Replete with supplemental K.   5. Anemia of Chronic Disease: per primary team.   6. Esophageal Adenocarcinoma: per primary team. GI also following.   7. DM: on insulin. Per primary team.   For questions or updates, please contact [place] Please consult [email or web address] for contact info under Cardiology/STEMI.   Signed, Ena Dawley, MD  10/02/2016, 12:48 PM    The patient was seen, examined and discussed with Brittainy M. Rosita Fire, PA-C and I agree with the above.   82 year old male admitted with acute on chronic combined biventricular failure. Great diuresis with iv lasix, -9 lbs overnight, now 181 lbs, baseline 184-193 lbs, improved Crea 1.7 -> 1.4->1.0. Coox yesterday 58%, I will repeat today given significant improvement. He is in a-fib with high heart rates 90-115, I  will decrease lasix to 40 mg iv TID and obtain co-ox. Replace potassium, start spironolactine 12.5 mg po daily. CXR shows improved pulmonary edema, but persistent B/L pleural effusions.   Ena Dawley, MD 10/02/2016

## 2016-10-02 NOTE — Progress Notes (Signed)
Pt is up in the chair with  cough and rhonchi,  Congested lungs, desat of 3L of O2, IV lasix given and increased for 3L to 5L sat 97-99

## 2016-10-02 NOTE — Progress Notes (Signed)
Patient ID: Enis Riecke, male   DOB: 1934/08/29, 82 y.o.   MRN: [id number]  This NP visited patient at the bedside with daughter/Ruth to continue discussion regarding diagnosis , prognosis, GOCs, EOL wishes disposition and options.  Discussed his multiple co-morbidites and high risk for decompensation. Ultimately family hope to take the patient home to [place] to be closer to family.  They understand however,  that depending on outcomes over the next few weeks that decision will have to be made.  They are open to local SNF for short term rehabilitation.  Recommend Palliative for follow on discharge   Discussed with family the importance of continued conversation with patient and each other and their  medical providers regarding overall plan of care and treatment options,  ensuring decisions are within the context of the patients values and GOCs.  Questions and concerns addressed  Time in  0930        Time out    0950       Total time spent on the unit 20  min  Greater than 50% of the time was spent in counseling and coordination of care  Wadie Lessen NP  Palliative Medicine Team Team Phone # [phone] Pager [phone]  Palliative medicine will continue to support holistically

## 2016-10-03 DIAGNOSIS — R945 Abnormal results of liver function studies: Secondary | ICD-10-CM

## 2016-10-03 DIAGNOSIS — N171 Acute kidney failure with acute cortical necrosis: Secondary | ICD-10-CM

## 2016-10-03 LAB — CBC
HEMATOCRIT: 32.4 % — AB (ref 39.0–52.0)
HEMOGLOBIN: 9.7 g/dL — AB (ref 13.0–17.0)
MCH: 25.9 pg — AB (ref 26.0–34.0)
MCHC: 29.9 g/dL — AB (ref 30.0–36.0)
MCV: 86.6 fL (ref 78.0–100.0)
Platelets: 328 10*3/uL (ref 150–400)
RBC: 3.74 MIL/uL — ABNORMAL LOW (ref 4.22–5.81)
RDW: 18 % — AB (ref 11.5–15.5)
WBC: 14.4 10*3/uL — ABNORMAL HIGH (ref 4.0–10.5)

## 2016-10-03 LAB — GLUCOSE, CAPILLARY
GLUCOSE-CAPILLARY: 122 mg/dL — AB (ref 65–99)
GLUCOSE-CAPILLARY: 114 mg/dL — AB (ref 65–99)
GLUCOSE-CAPILLARY: 155 mg/dL — AB (ref 65–99)
Glucose-Capillary: 216 mg/dL — ABNORMAL HIGH (ref 65–99)
Glucose-Capillary: 192 mg/dL — ABNORMAL HIGH (ref 65–99)
Glucose-Capillary: 192 mg/dL — ABNORMAL HIGH (ref 65–99)

## 2016-10-03 LAB — COMPREHENSIVE METABOLIC PANEL
ALK PHOS: 128 U/L — AB (ref 38–126)
ALT: 270 U/L — ABNORMAL HIGH (ref 17–63)
ANION GAP: 11 (ref 5–15)
AST: 186 U/L — ABNORMAL HIGH (ref 15–41)
Albumin: 2.5 g/dL — ABNORMAL LOW (ref 3.5–5.0)
BILIRUBIN TOTAL: 1.9 mg/dL — AB (ref 0.3–1.2)
BUN: 51 mg/dL — ABNORMAL HIGH (ref 6–20)
CALCIUM: 8.9 mg/dL (ref 8.9–10.3)
CO2: 37 mmol/L — ABNORMAL HIGH (ref 22–32)
Chloride: 94 mmol/L — ABNORMAL LOW (ref 101–111)
Creatinine, Ser: 0.98 mg/dL (ref 0.61–1.24)
Glucose, Bld: 134 mg/dL — ABNORMAL HIGH (ref 65–99)
Potassium: 3.9 mmol/L (ref 3.5–5.1)
Sodium: 142 mmol/L (ref 135–145)
TOTAL PROTEIN: 5.8 g/dL — AB (ref 6.5–8.1)

## 2016-10-03 LAB — COOXEMETRY PANEL
Carboxyhemoglobin: 1.9 % — ABNORMAL HIGH (ref 0.5–1.5)
Methemoglobin: 1.3 % (ref 0.0–1.5)
O2 Saturation: 76.9 %
Total hemoglobin: 9.5 g/dL — ABNORMAL LOW (ref 12.0–16.0)

## 2016-10-03 MED ORDER — BENZONATATE 100 MG PO CAPS
100.0000 mg | ORAL_CAPSULE | Freq: Once | ORAL | Status: AC
  Administered 2016-10-03: 100 mg via ORAL
  Filled 2016-10-03: qty 1

## 2016-10-03 MED ORDER — TRAZODONE HCL 50 MG PO TABS
50.0000 mg | ORAL_TABLET | Freq: Every day | ORAL | Status: DC
  Administered 2016-10-03: 50 mg via ORAL
  Filled 2016-10-03: qty 1

## 2016-10-03 MED ORDER — LACTULOSE 10 GM/15ML PO SOLN
20.0000 g | Freq: Once | ORAL | Status: AC
  Administered 2016-10-03: 20 g via ORAL
  Filled 2016-10-03: qty 30

## 2016-10-03 NOTE — Progress Notes (Signed)
Pt is alert with a delayed response. Falling asleep. Vitals stable on 02 2L. Md adjusting medications and encouraging rest.

## 2016-10-03 NOTE — Clinical Social Work Note (Signed)
CSW provided patient's daughter with list of bed offers.  Dayton Scrape, [place]

## 2016-10-03 NOTE — Progress Notes (Signed)
Patient ID: Mark Rivers, male   DOB: [birth year], 82 y.o.   MRN: [id number]  PROGRESS NOTE    Mark Rivers  AST:[id number] DOB: 1934/05/21 DOA: 09/21/2016  PCP: [name], MD   Brief Narrative:  27 male  atrial fibrillation CHad2Vasc2 score=4,  CAD,  CVA,  diastolic CHF,  DM,  esophageal carcinoma,  recent hospitalization for decompensated CHF (8/23 - 8/25), has been experiencing LE edema since discharge.  He was admitted for acute decompensated CHF.  Seen by cardio in consultation.  GI consulted for abnormal LFT's.    Assessment & Plan:   Principal Problem:   Acute on chronic respiratory failure with hypoxia (HCC) / Acute on chronic diastolic congestive heart failure (HCC) - Continue lasix 80 mg IV TID--->40 tid - LVEF reduced down to 35-40% (previosuly normal 55-60%) - He is negative 5,160 L -rest as per Cards  Active Problems:   Diabetes mellitus with stage 3 chronic kidney disease (HCC) - Continue Lantus 8 units at bedtime and sliding scale insulin - CBG's in past 24 hours: 114-192    Abnormal LFT's Possible transaminitis from passive congestion from CHF - GI signed off 9/23 - US unremarkable - Continue to trend LFT's    Persistent atrial fibrillation (HCC) - CHA2DS2-VASc Score 3 - Not on AC due to esophageal ca and prior GI bleed  - Continue metoprolol for rate control     Acute kidney failure superimposed on CKD stage 4 - Cr now trending downward over the past couple of days with adjustment of IV diuretics    Essential hypertension - Continue metoprolol     Hypokalemia - Supplemented  - Follow up BMP in am    Anemia of chronic disease - Likely due to esophageal adenocarcinoma - Hemoglobin is stable at 10    Esophageal adenocarcinoma - Palliative consulted for goals of care   Possible dementia -The patient's son knows every afternoon at 1600 -Patient also is on trazodone 100 which I will decrease to 50 mg daily -I will get an  ammoniain the a.m. 9/26 and start empirically lactulose 1 today    DVT prophylaxis: Lovenox subQ Code Status: DNR/DNI Family Communication: long discussion with daughter at the bedside Disposition Plan: not yet stable for discharge    Consultants:   Gastroenterology, Dr. Enis Gash  Cardiology   Palliative care   Procedures:   ECHO 9/15 - EF 35-40%  PICC placed 9/23  Antimicrobials:   None    Subjective:  Some confusion this afternoon yesterday afternoon at this time patient was combative and agitated He seems to be sleepy this morning and had to room changes and also was given trazodone 100 last p.m. He can tell me where he is eating tell me the county he can tell me the day he cannot tell me the president  He does not have any pain he does not have any nausea  Objective: Vitals:   10/03/16 0439 10/03/16 0757 10/03/16 1416 10/03/16 1625  BP: 113/61 124/66 (!) 121/104   Pulse: (!) 110 99 (!) 130 (!) 112  Resp: 18 16 16    Temp: 98.5 F (36.9 C) (!) 97.5 F (36.4 C) 98 F (36.7 C)   TempSrc: Oral Oral Oral   SpO2: 98% 99% 96% 97%  Weight: 86.6 kg (191 lb)     Height:        Intake/Output Summary (Last 24 hours) at 10/03/16 1636 Last data filed at 10/03/16 1410  Gross per 24 hour  Intake  620 ml  Output             1650 ml  Net            -1030 ml   Filed Weights   10/01/16 0602 10/02/16 0500 10/03/16 0439  Weight: 82.1 kg (181 lb) 82.2 kg (181 lb 4.8 oz) 86.6 kg (191 lb)    Physical Exam   Awake but seems a little bit sleepyand nods off when I taled to him Anicteric no pallor No JVD Holosystolic murmur left upper sternal edge no radiation Abdomen soft no organomegaly no rebound No lower extremity edema Chest has no rales no rhonchi Neurologically he's intactat buthe is  slightly confused    Data Reviewed: I have personally reviewed following labs and imaging studies  CBC:  Recent Labs Lab 09/27/16 0409 09/29/16 0742  09/30/16 0529 10/02/16 0451 10/03/16 0402  WBC 18.4* 15.0* 14.4* 17.0* 14.4*  NEUTROABS  --   --  11.9*  --   --   HGB 9.7* 9.9* 10.1* 10.0* 9.7*  HCT 31.4* 32.7* 33.7* 34.2* 32.4*  MCV 84.9 85.2 85.5 87.5 86.6  PLT 431* 435* 413* 392 361   Basic Metabolic Panel:  Recent Labs Lab 09/28/16 0417 09/29/16 0742 09/30/16 0529 10/02/16 0451 10/03/16 0402  NA 130* 135 138 142 142  K 4.8 4.0 3.4* 3.4* 3.9  CL 91* 94* 96* 95* 94*  CO2 27 32 34* 39* 37*  GLUCOSE 89 84 113* 128* 134*  BUN 109* 102* 89* 62* 51*  CREATININE 2.02* 1.73* 1.41* 1.07 0.98  CALCIUM 8.5* 8.5* 8.6* 8.9 8.9   GFR: Estimated Creatinine Clearance: 63.2 mL/min (by C-G formula based on SCr of 0.98 mg/dL). Liver Function Tests:  Recent Labs Lab 09/28/16 0417 09/29/16 0742 09/30/16 0529 10/02/16 0451 10/03/16 0402  AST 806* 547* 432* 244* 186*  ALT 545* 447* 407* 311* 270*  ALKPHOS 148* 127* 153* 144* 128*  BILITOT 1.8* 1.5* 1.8* 1.9* 1.9*  PROT 6.4* 5.9* 6.1* 5.8* 5.8*  ALBUMIN 2.7* 2.5* 2.5* 2.5* 2.5*   No results for input(s): LIPASE, AMYLASE in the last 168 hours.  Recent Labs Lab 09/27/16 1150  AMMONIA 34   Coagulation Profile:  Recent Labs Lab 09/28/16 0417 09/29/16 0742 09/30/16 0529 10/01/16 0731  INR 2.01 1.85 1.57 1.39   Cardiac Enzymes: No results for input(s): CKTOTAL, CKMB, CKMBINDEX, TROPONINI in the last 168 hours. BNP (last 3 results) No results for input(s): PROBNP in the last 8760 hours. HbA1C: No results for input(s): HGBA1C in the last 72 hours. CBG:  Recent Labs Lab 10/02/16 1105 10/02/16 1704 10/02/16 2119 10/03/16 0753 10/03/16 1226  GLUCAP 216* 114* 122* 114* 192*   Lipid Profile: No results for input(s): CHOL, HDL, LDLCALC, TRIG, CHOLHDL, LDLDIRECT in the last 72 hours. Thyroid Function Tests: No results for input(s): TSH, T4TOTAL, FREET4, T3FREE, THYROIDAB in the last 72 hours. Anemia Panel: No results for input(s): VITAMINB12, FOLATE, FERRITIN,  TIBC, IRON, RETICCTPCT in the last 72 hours. Urine analysis:    Component Value Date/Time   COLORURINE YELLOW 09/22/2016 1400   APPEARANCEUR CLEAR 09/22/2016 1400   LABSPEC 1.012 09/22/2016 1400   PHURINE 5.0 09/22/2016 1400   GLUCOSEU NEGATIVE 09/22/2016 1400   HGBUR MODERATE (A) 09/22/2016 1400   BILIRUBINUR NEGATIVE 09/22/2016 1400   KETONESUR NEGATIVE 09/22/2016 1400   PROTEINUR 30 (A) 09/22/2016 1400   UROBILINOGEN 0.2 10/26/2008 1315   NITRITE NEGATIVE 09/22/2016 1400   LEUKOCYTESUR NEGATIVE 09/22/2016 1400   Sepsis Labs: @LABRCNTIP (procalcitonin:4,lacticidven:4)  Recent Results (from the past 240 hour(s))  Culture, blood (Routine X 2) w Reflex to ID Panel     Status: None   Collection Time: 09/26/16  3:33 PM  Result Value Ref Range Status   Specimen Description BLOOD LEFT ARM  Final   Special Requests IN PEDIATRIC BOTTLE Blood Culture adequate volume  Final   Culture NO GROWTH 5 DAYS  Final   Report Status 10/01/2016 FINAL  Final  Culture, blood (Routine X 2) w Reflex to ID Panel     Status: None   Collection Time: 09/26/16  3:36 PM  Result Value Ref Range Status   Specimen Description BLOOD RIGHT ARM  Final   Special Requests IN PEDIATRIC BOTTLE Blood Culture adequate volume  Final   Culture NO GROWTH 5 DAYS  Final   Report Status 10/01/2016 FINAL  Final      Radiology Studies: Dg Chest Port 1 View  Result Date: 10/02/2016 CLINICAL DATA:  Cough.  Dyspnea.  Hypoxia. EXAM: PORTABLE CHEST 1 VIEW COMPARISON:  10/01/2016 chest radiograph. FINDINGS: Right PICC terminates in the lower third of the superior vena cava. Intact sternotomy wires. Stable cardiomediastinal silhouette with mild cardiomegaly. No pneumothorax. Stable small bilateral pleural effusions and hazy bibasilar lung opacities. No overt pulmonary edema. IMPRESSION: 1. Stable mild cardiomegaly without overt pulmonary edema. 2. Stable small bilateral pleural effusions with hazy bibasilar lung opacities, favor  atelectasis. Electronically Signed   By: Ilona Sorrel M.D.   On: 10/02/2016 12:28   Dg Chest Port 1 View  Result Date: 10/01/2016 CLINICAL DATA:  Central line placement EXAM: PORTABLE CHEST 1 VIEW COMPARISON:  09/26/2016 FINDINGS: Right arm PICC tip has been placed with the tip in the SVC. Bibasilar airspace disease and bilateral effusions unchanged from prior exam. Prior CABG IMPRESSION: Right arm PICC tip in the SVC in good position Bibasilar atelectasis and effusion unchanged. Electronically Signed   By: Franchot Gallo M.D.   On: 10/01/2016 16:24        Scheduled Meds: . enoxaparin (LOVENOX) injection  40 mg Subcutaneous Daily  . feeding supplement (ENSURE ENLIVE)  237 mL Oral BID BM  . furosemide  40 mg Intravenous TID  . insulin aspart  0-9 Units Subcutaneous TID WC  . insulin glargine  8 Units Subcutaneous QHS  . metoprolol succinate  37.5 mg Oral BID  . pantoprazole  40 mg Oral Q0600  . spironolactone  12.5 mg Oral Daily  . traZODone  100 mg Oral QHS  . vitamin C  500 mg Oral Daily   Continuous Infusions:   LOS: 11 days    Time spent: 25 minutes  Greater than 50% of the time spent on counseling and coordinating the care.   Nita Sells, MD [facility] Pager [phone]  If 7PM-7AM, please contact night-coverage [email or web address] Password [email or web address] 10/03/2016, 4:36 PM

## 2016-10-03 NOTE — Progress Notes (Signed)
Progress Note  Patient Name: Mark Rivers Date of Encounter: 10/03/2016  Primary Cardiologist: Dr. Johnsie Cancel   Subjective   Pt's daughter is by bedside. She is here from [place] and is doing much of the talking. He feels better, he sat in chair yesterday, hasn't walked yet, the daughter wants to take him to a facility in [place].  Inpatient Medications    Scheduled Meds: . enoxaparin (LOVENOX) injection  40 mg Subcutaneous Daily  . feeding supplement (ENSURE ENLIVE)  237 mL Oral BID BM  . furosemide  40 mg Intravenous TID  . insulin aspart  0-9 Units Subcutaneous TID WC  . insulin glargine  8 Units Subcutaneous QHS  . metoprolol succinate  37.5 mg Oral BID  . pantoprazole  40 mg Oral Q0600  . spironolactone  12.5 mg Oral Daily  . traZODone  100 mg Oral QHS  . vitamin C  500 mg Oral Daily   Continuous Infusions:  PRN Meds: acetaminophen, guaiFENesin-dextromethorphan, metoprolol tartrate, ondansetron (ZOFRAN) IV, sodium chloride flush, traMADol   Vital Signs    Vitals:   10/02/16 1400 10/02/16 2015 10/03/16 0439 10/03/16 0757  BP: 119/89 109/64 113/61 124/66  Pulse: (!) 101 87 (!) 110 99  Resp:  18 18 16   Temp:  98 F (36.7 C) 98.5 F (36.9 C) (!) 97.5 F (36.4 C)  TempSrc:  Oral Oral Oral  SpO2: 100% 94% 98% 99%  Weight:   191 lb (86.6 kg)   Height:        Intake/Output Summary (Last 24 hours) at 10/03/16 0945 Last data filed at 10/03/16 0939  Gross per 24 hour  Intake              740 ml  Output             1900 ml  Net            -1160 ml   Filed Weights   10/01/16 0602 10/02/16 0500 10/03/16 0439  Weight: 181 lb (82.1 kg) 181 lb 4.8 oz (82.2 kg) 191 lb (86.6 kg)    Telemetry    afib in the low 100s.- Personally Reviewed  ECG    09/21/16 atrial fibrillation w/ RVR 145 bpm - Personally Reviewed  Physical Exam   GEN: No acute distress.   Neck: No JVD Cardiac: irreguarlly irregular, slightly tachy rate, no murmurs, rubs, or gallops.  Respiratory:  decreased BS at the bases with faint basilar rales on the right GI: Soft, nontender, non-distended  MS: minimal LE edema; No deformity. Neuro:  Nonfocal  Psych: Normal affect   Labs    Chemistry  Recent Labs Lab 09/30/16 0529 10/02/16 0451 10/03/16 0402  NA 138 142 142  K 3.4* 3.4* 3.9  CL 96* 95* 94*  CO2 34* 39* 37*  GLUCOSE 113* 128* 134*  BUN 89* 62* 51*  CREATININE 1.41* 1.07 0.98  CALCIUM 8.6* 8.9 8.9  PROT 6.1* 5.8* 5.8*  ALBUMIN 2.5* 2.5* 2.5*  AST 432* 244* 186*  ALT 407* 311* 270*  ALKPHOS 153* 144* 128*  BILITOT 1.8* 1.9* 1.9*  GFRNONAA 45* >60 >60  GFRAA 53* >60 >60  ANIONGAP 8 8 11      Hematology  Recent Labs Lab 09/30/16 0529 10/02/16 0451 10/03/16 0402  WBC 14.4* 17.0* 14.4*  RBC 3.94* 3.91* 3.74*  HGB 10.1* 10.0* 9.7*  HCT 33.7* 34.2* 32.4*  MCV 85.5 87.5 86.6  MCH 25.6* 25.6* 25.9*  MCHC 30.0 29.2* 29.9*  RDW 17.3* 17.6* 18.0*  PLT 413* 392 328    Cardiac EnzymesNo results for input(s): TROPONINI in the last 168 hours. No results for input(s): TROPIPOC in the last 168 hours.   BNPNo results for input(s): BNP, PROBNP in the last 168 hours.   DDimer No results for input(s): DDIMER in the last 168 hours.   Radiology    Dg Chest Port 1 View  Result Date: 10/02/2016 CLINICAL DATA:  Cough.  Dyspnea.  Hypoxia. EXAM: PORTABLE CHEST 1 VIEW COMPARISON:  10/01/2016 chest radiograph. FINDINGS: Right PICC terminates in the lower third of the superior vena cava. Intact sternotomy wires. Stable cardiomediastinal silhouette with mild cardiomegaly. No pneumothorax. Stable small bilateral pleural effusions and hazy bibasilar lung opacities. No overt pulmonary edema. IMPRESSION: 1. Stable mild cardiomegaly without overt pulmonary edema. 2. Stable small bilateral pleural effusions with hazy bibasilar lung opacities, favor atelectasis. Electronically Signed   By: Ilona Sorrel M.D.   On: 10/02/2016 12:28   Dg Chest Port 1 View  Result Date:  10/01/2016 CLINICAL DATA:  Central line placement EXAM: PORTABLE CHEST 1 VIEW COMPARISON:  09/26/2016 FINDINGS: Right arm PICC tip has been placed with the tip in the SVC. Bibasilar airspace disease and bilateral effusions unchanged from prior exam. Prior CABG IMPRESSION: Right arm PICC tip in the SVC in good position Bibasilar atelectasis and effusion unchanged. Electronically Signed   By: Franchot Gallo M.D.   On: 10/01/2016 16:24    Cardiac Studies   Study Conclusions  - Left ventricle: The cavity size was normal. Wall thickness was   increased in a pattern of mild LVH. Systolic function was   moderately reduced. The estimated ejection fraction was in the   range of 35% to 40%. Diffuse hypokinesis. The study is not   technically sufficient to allow evaluation of LV diastolic   function. - Aortic valve: Moderately to severely calcified annulus. Mildly   thickened, mildly calcified leaflets. Morphologically, it appears   there is at least mild if not mild to moderate aortic valvular   stenosis. - Mitral valve: Calcified annulus. There was moderate to severe   regurgitation. - Left atrium: The atrium was severely dilated. - Right ventricle: The cavity size was moderately dilated. Systolic   function was severely reduced. - Right atrium: The atrium was moderately dilated. - Atrial septum: No defect or patent foramen ovale was identified. - Tricuspid valve: There was moderate regurgitation.   Patient Profile     82 y.o. male with a hx of CAD s/p CABG 10/2015, persistent AFib not on anticoagulation due to prior GI bleed, esophageal adenocarcinoma, DM2, CKD, HTN, HL, COPD, and chronic diastolic CHF admitted for acute HF in the setting of afib w/ RVR. Echo with reduced EF, down to 35-40% (previously normal). Also with right sided HF. No plans for ischemic w/u given multiple comorbidities including esophageal cancer and stage IV CKD.   Assessment & Plan    1. Acute on Chronic Systolic  and Diastolic HF/ Biventricular HF:  Labs suggestive of significant RV dysfunction w/ elevated LFTs (hepatic congestion). LVEF reduced, down to 35-40% (previously normal 55-60% 04/2016). RV is also moderately dilated and RV systolic function severely reduced by echo. Dr. Harrington Challenger recommended PICC line placement for CooX measurement to help guide therapy. Carboxyhemoglobin yesterday was 1.5%. MD to further advise. Continue IV Lasix for now, 80 mg TID. I/Os net negative 3.9L. He diuresed 2.4L out yesterday. Still with mild volume overload. Continue strict I/Os for monitoring.    2. Atrial Fibrillation: rate has  improved since admission but still in the low 100s resting. Continue BB therapy with metoprolol succinate. He is not on [facility] given esophageal cancer and h/o GIB. Continue to monitor on telemetry. Correct hypokalemia with supplemental K. Monitor.   3. Acute on Chronic Kidney Disease: Stage IV CKD at baseline. Acute on chronic kidney injury. SCr as high as 2.6 this admit, but has improved. SCr 1.07 today.   4. Hypokalemia: K 3.4. Replete with supplemental K.   5. Anemia of Chronic Disease: per primary team.   6. Esophageal Adenocarcinoma: per primary team. GI also following.   7. DM: on insulin. Per primary team.   82 year old male admitted with acute on chronic combined biventricular failure. Great diuresis with iv lasix, -11 lbs overnight, now 181 lbs, baseline 184-193 lbs, improved Crea 1.7 -> 1.4->1.0->0.98. Coox on 9/23 58%, on 9/24 66%, today's pending. He is in a-fib with high heart rates 90-115, I will continue lasix to 40 mg iv TID. Replaced potassium, started spironolactine 12.5 mg po daily. CXR done yesterday shows improved pulmonary edema, but persistent B/L pleural effusions.   Goal for today:  - continue iv diuresis, follow coox - mobilize, consult PT&OT - anticipated discharge on 9/27  For questions or updates, please contact [place] Please consult [email or web address] for contact  info under Cardiology/STEMI.  Ena Dawley, MD 10/03/2016

## 2016-10-04 LAB — CBC WITH DIFFERENTIAL/PLATELET
BASOS ABS: 0 10*3/uL (ref 0.0–0.1)
BASOS PCT: 0 %
Eosinophils Absolute: 0.3 10*3/uL (ref 0.0–0.7)
Eosinophils Relative: 2 %
HCT: 32 % — ABNORMAL LOW (ref 39.0–52.0)
Hemoglobin: 9.6 g/dL — ABNORMAL LOW (ref 13.0–17.0)
LYMPHS PCT: 12 %
Lymphs Abs: 1.8 10*3/uL (ref 0.7–4.0)
MCH: 25.9 pg — ABNORMAL LOW (ref 26.0–34.0)
MCHC: 30 g/dL (ref 30.0–36.0)
MCV: 86.5 fL (ref 78.0–100.0)
MONOS PCT: 6 %
Monocytes Absolute: 0.9 10*3/uL (ref 0.1–1.0)
NEUTROS ABS: 12.2 10*3/uL — AB (ref 1.7–7.7)
NEUTROS PCT: 80 %
PLATELETS: 337 10*3/uL (ref 150–400)
RBC: 3.7 MIL/uL — AB (ref 4.22–5.81)
RDW: 18 % — AB (ref 11.5–15.5)
WBC: 15.2 10*3/uL — ABNORMAL HIGH (ref 4.0–10.5)

## 2016-10-04 LAB — COMPREHENSIVE METABOLIC PANEL
ALBUMIN: 2.6 g/dL — AB (ref 3.5–5.0)
ALT: 229 U/L — ABNORMAL HIGH (ref 17–63)
AST: 142 U/L — ABNORMAL HIGH (ref 15–41)
Alkaline Phosphatase: 130 U/L — ABNORMAL HIGH (ref 38–126)
Anion gap: 8 (ref 5–15)
BUN: 41 mg/dL — ABNORMAL HIGH (ref 6–20)
CHLORIDE: 92 mmol/L — AB (ref 101–111)
CO2: 42 mmol/L — AB (ref 22–32)
Calcium: 9 mg/dL (ref 8.9–10.3)
Creatinine, Ser: 0.96 mg/dL (ref 0.61–1.24)
GFR calc non Af Amer: >60 mL/min (ref >60)
GLUCOSE: 120 mg/dL — AB (ref 65–99)
POTASSIUM: 3.7 mmol/L (ref 3.5–5.1)
SODIUM: 142 mmol/L (ref 135–145)
Total Bilirubin: 1.6 mg/dL — ABNORMAL HIGH (ref 0.3–1.2)
Total Protein: 6.1 g/dL — ABNORMAL LOW (ref 6.5–8.1)

## 2016-10-04 LAB — GLUCOSE, CAPILLARY
Glucose-Capillary: 118 mg/dL — ABNORMAL HIGH (ref 65–99)
Glucose-Capillary: 171 mg/dL — ABNORMAL HIGH (ref 65–99)
Glucose-Capillary: 138 mg/dL — ABNORMAL HIGH (ref 65–99)
Glucose-Capillary: 120 mg/dL — ABNORMAL HIGH (ref 65–99)

## 2016-10-04 LAB — PROTIME-INR
INR: 1.25
Prothrombin Time: 15.6 seconds — ABNORMAL HIGH (ref 11.4–15.2)

## 2016-10-04 MED ORDER — TRAZODONE HCL 50 MG PO TABS
25.0000 mg | ORAL_TABLET | Freq: Every day | ORAL | Status: DC
  Administered 2016-10-04: 25 mg via ORAL
  Filled 2016-10-04: qty 1

## 2016-10-04 MED ORDER — FUROSEMIDE 80 MG PO TABS
80.0000 mg | ORAL_TABLET | Freq: Two times a day (BID) | ORAL | Status: DC
  Administered 2016-10-04 – 2016-10-05 (×2): 80 mg via ORAL
  Filled 2016-10-04 (×3): qty 1

## 2016-10-04 NOTE — Progress Notes (Signed)
Physical Therapy Treatment Patient Details Name: Mark Rivers MRN: [id number] DOB: [birth year] Today's Date: 10/04/2016    History of Present Illness Mark Rivers is a 82 y.o. male with a hx of CAD s/p CABG 10/2015, persistent AFib not on anticoagulation due to prior GI bleed, esophageal adenocarcinoma, DM2, CKD, HTN, HL, COPD, and chronic diastolic CHF, Afib RVR and CHF, FTT with multiple hospitalizations    PT Comments    Patient was able to perform several functional transfers and ambulate 85ft this session with mod A +2. Pt required total A for pericare in standing with RW and min A for balance. Pt continues to demonstrate strength, balance, and cognitive deficits increasing his risk for falls. Current plan remains appropriate.    Follow Up Recommendations  SNF;Other (comment)     Equipment Recommendations  None recommended by PT    Recommendations for Other Services       Precautions / Restrictions Precautions Precautions: Fall Restrictions Weight Bearing Restrictions: No    Mobility  Bed Mobility Overal bed mobility: Needs Assistance Bed Mobility: Supine to Sit     Supine to sit: HOB elevated;Mod assist     General bed mobility comments: assist to elevate trunk into sitting and to scoot hips toward EOB with use of chuck pad  Transfers Overall transfer level: Needs assistance Equipment used: Rolling walker (2 wheeled) Transfers: Sit to/from [employer] Transfers Sit to Stand: +2 physical assistance;From elevated surface;Mod assist Stand pivot transfers: Mod assist;+2 physical assistance       General transfer comment: assist to power up into standing and then for balance and management of RW when pivoting from EOB to [facility]  Ambulation/Gait Ambulation/Gait assistance: Mod assist Ambulation Distance (Feet): 4 Feet Assistive device: Rolling walker (2 wheeled) Gait Pattern/deviations: Shuffle;Trunk flexed;Step-through pattern;Decreased step length -  right;Decreased step length - left     General Gait Details: multimodal cues for posture and assist to manage RW   Stairs            Wheelchair Mobility    Modified Rankin (Stroke Patients Only)       Balance Overall balance assessment: Needs assistance;History of Falls Sitting-balance support: Bilateral upper extremity supported;Feet supported Sitting balance-Leahy Scale: Poor Sitting balance - Comments: posterior lean initially and then min guard for safety Postural control: Posterior lean Standing balance support: Bilateral upper extremity supported Standing balance-Leahy Scale: Poor Standing balance comment: pt stood with RW and min A for pericare                            Cognition Arousal/Alertness: Awake/alert Behavior During Therapy: Restless Overall Cognitive Status: Impaired/Different from baseline Area of Impairment: Following commands;Safety/judgement;Problem solving;Attention                   Current Attention Level: Sustained Memory: Decreased short-term memory Following Commands: Follows one step commands with increased time;Follows one step commands inconsistently (and multimodal cues) Safety/Judgement: Decreased awareness of safety;Decreased awareness of deficits   Problem Solving: Slow processing;Decreased initiation;Requires verbal cues;Requires tactile cues;Difficulty sequencing        Exercises      General Comments        Pertinent Vitals/Pain Pain Assessment: Faces Faces Pain Scale: Hurts little more Pain Location: unspecified Pain Descriptors / Indicators: Grimacing;Guarding;Moaning ("hurts!") Pain Intervention(s): Limited activity within patient's tolerance;Monitored during session;Repositioned    Home Living  Prior Function            PT Goals (current goals can now be found in the care plan section) Acute Rehab PT Goals Patient Stated Goal: did not state Progress towards  PT goals: Progressing toward goals    Frequency    Min 3X/week      PT Plan Current plan remains appropriate    Co-evaluation              AM-PAC PT "6 Clicks" Daily Activity  Outcome Measure  Difficulty turning over in bed (including adjusting bedclothes, sheets and blankets)?: A Lot Difficulty moving from lying on back to sitting on the side of the bed? : Unable Difficulty sitting down on and standing up from a chair with arms (e.g., wheelchair, bedside commode, etc,.)?: Unable Help needed moving to and from a bed to chair (including a wheelchair)?: A Lot Help needed walking in hospital room?: A Lot Help needed climbing 3-5 steps with a railing? : A Lot 6 Click Score: 10    End of Session Equipment Utilized During Treatment: Gait belt;Oxygen Activity Tolerance: Patient tolerated treatment well Patient left: in chair;with call bell/phone within reach;with chair alarm set Nurse Communication: Mobility status PT Visit Diagnosis: Unsteadiness on feet (R26.81);Muscle weakness (generalized) (M62.81)     Time: 2536-6440 PT Time Calculation (min) (ACUTE ONLY): 28 min  Charges:  $Gait Training: 8-22 mins $Therapeutic Activity: 8-22 mins                    G Codes:       Earney Navy, PTA Pager: [phone]     Darliss Cheney 10/04/2016, 10:15 AM

## 2016-10-04 NOTE — Progress Notes (Signed)
Patient ID: Mark Rivers, male   DOB: [birth year], 82 y.o.   MRN: [id number]  This NP visited patient at the bedside with daughter/Ruth to continue discussion regarding diagnosis , prognosis, GOCs, EOL wishes disposition and options.  Discussed his multiple co-morbidites and high risk for decompensation. Ultimately family hope to take the patient home to [place] to be closer to family, discussed my concern that this may not be realistically possible.   Cognitively patient continues to be intermittently confused and easily agitated, a long drive would be difficult.   They are open to local SNF for short term rehabilitation.    Daughter/Ruth tells me that she plans to leave [place] on Thursday, I verbalized my concerns regarding coordination of care for her father who does not have capacity to make his own medical decisions at this time.    Discussed with daughter the importance of continued conversation with the family and their  medical providers regarding overall plan of care and treatment options,  ensuring decisions are within the context of the patients values and GOCs.  I strongly encouraged the daughter to have a detailed discussion with other family members prepare for future plans as patient is very high risk for decompensation.  Questions and concerns addressed  Time in  1000       Time out  1129          Total time spent on the unit   20 min  Greater than 50% of the time was spent in counseling and coordination of care  Wadie Lessen NP  Palliative Medicine Team Team Phone # [phone] Pager [phone]  Palliative medicine will continue to support holistically

## 2016-10-04 NOTE — Progress Notes (Signed)
Progress Note  Patient Name: Mark Rivers Date of Encounter: 10/04/2016  Primary Cardiologist: Dr. Johnsie Cancel   Subjective   Pt's daughter is by bedside. She is here from [place] and is doing much of the talking. He feels better, he hasn't been out of bed since 2 days ago and hasn't walked since the admission. The daughter wants to take him to a facility in [place].  Inpatient Medications    Scheduled Meds: . enoxaparin (LOVENOX) injection  40 mg Subcutaneous Daily  . feeding supplement (ENSURE ENLIVE)  237 mL Oral BID BM  . furosemide  40 mg Intravenous TID  . insulin aspart  0-9 Units Subcutaneous TID WC  . insulin glargine  8 Units Subcutaneous QHS  . metoprolol succinate  37.5 mg Oral BID  . pantoprazole  40 mg Oral Q0600  . spironolactone  12.5 mg Oral Daily  . traZODone  50 mg Oral QHS  . vitamin C  500 mg Oral Daily   Continuous Infusions:  PRN Meds: acetaminophen, guaiFENesin-dextromethorphan, metoprolol tartrate, ondansetron (ZOFRAN) IV, sodium chloride flush   Vital Signs    Vitals:   10/03/16 1724 10/03/16 2102 10/04/16 0425 10/04/16 0752  BP: (!) 124/56 136/73 123/83 134/78  Pulse: 62 (!) 116 96 93  Resp:  17 18 18   Temp: 98.4 F (36.9 C) 97.8 F (36.6 C) 98 F (36.7 C) 97.8 F (36.6 C)  TempSrc: Oral Oral Oral Oral  SpO2: 99% 97% 99% 98%  Weight:   176 lb (79.8 kg)   Height:        Intake/Output Summary (Last 24 hours) at 10/04/16 0829 Last data filed at 10/04/16 0600  Gross per 24 hour  Intake              720 ml  Output             1251 ml  Net             -531 ml   Filed Weights   10/02/16 0500 10/03/16 0439 10/04/16 0425  Weight: 181 lb 4.8 oz (82.2 kg) 191 lb (86.6 kg) 176 lb (79.8 kg)    Telemetry    afib in the low 100s.- Personally Reviewed  ECG    09/21/16 atrial fibrillation w/ RVR 145 bpm - Personally Reviewed  Physical Exam   GEN: No acute distress.   Neck: No JVD Cardiac: irreguarlly irregular, slightly tachy rate, no murmurs,  rubs, or gallops.  Respiratory: decreased BS at the bases with faint basilar rales on the right GI: Soft, nontender, non-distended  MS: minimal LE edema; No deformity. Neuro:  Nonfocal  Psych: Normal affect   Labs    Chemistry  Recent Labs Lab 10/02/16 0451 10/03/16 0402 10/04/16 0446  NA 142 142 142  K 3.4* 3.9 3.7  CL 95* 94* 92*  CO2 39* 37* 42*  GLUCOSE 128* 134* 120*  BUN 62* 51* 41*  CREATININE 1.07 0.98 0.96  CALCIUM 8.9 8.9 9.0  PROT 5.8* 5.8* 6.1*  ALBUMIN 2.5* 2.5* 2.6*  AST 244* 186* 142*  ALT 311* 270* 229*  ALKPHOS 144* 128* 130*  BILITOT 1.9* 1.9* 1.6*  GFRNONAA >60 >60 >60  GFRAA >60 >60 >60  ANIONGAP 8 11 8      Hematology  Recent Labs Lab 10/02/16 0451 10/03/16 0402 10/04/16 0446  WBC 17.0* 14.4* 15.2*  RBC 3.91* 3.74* 3.70*  HGB 10.0* 9.7* 9.6*  HCT 34.2* 32.4* 32.0*  MCV 87.5 86.6 86.5  MCH 25.6* 25.9* 25.9*  MCHC 29.2* 29.9* 30.0  RDW 17.6* 18.0* 18.0*  PLT 392 328 337    Cardiac EnzymesNo results for input(s): TROPONINI in the last 168 hours. No results for input(s): TROPIPOC in the last 168 hours.   BNPNo results for input(s): BNP, PROBNP in the last 168 hours.   DDimer No results for input(s): DDIMER in the last 168 hours.   Radiology    Dg Chest Port 1 View  Result Date: 10/02/2016 CLINICAL DATA:  Cough.  Dyspnea.  Hypoxia. EXAM: PORTABLE CHEST 1 VIEW COMPARISON:  10/01/2016 chest radiograph. FINDINGS: Right PICC terminates in the lower third of the superior vena cava. Intact sternotomy wires. Stable cardiomediastinal silhouette with mild cardiomegaly. No pneumothorax. Stable small bilateral pleural effusions and hazy bibasilar lung opacities. No overt pulmonary edema. IMPRESSION: 1. Stable mild cardiomegaly without overt pulmonary edema. 2. Stable small bilateral pleural effusions with hazy bibasilar lung opacities, favor atelectasis. Electronically Signed   By: Ilona Sorrel M.D.   On: 10/02/2016 12:28    Cardiac Studies    Study Conclusions  - Left ventricle: The cavity size was normal. Wall thickness was   increased in a pattern of mild LVH. Systolic function was   moderately reduced. The estimated ejection fraction was in the   range of 35% to 40%. Diffuse hypokinesis. The study is not   technically sufficient to allow evaluation of LV diastolic   function. - Aortic valve: Moderately to severely calcified annulus. Mildly   thickened, mildly calcified leaflets. Morphologically, it appears   there is at least mild if not mild to moderate aortic valvular   stenosis. - Mitral valve: Calcified annulus. There was moderate to severe   regurgitation. - Left atrium: The atrium was severely dilated. - Right ventricle: The cavity size was moderately dilated. Systolic   function was severely reduced. - Right atrium: The atrium was moderately dilated. - Atrial septum: No defect or patent foramen ovale was identified. - Tricuspid valve: There was moderate regurgitation.   Patient Profile     82 y.o. male with a hx of CAD s/p CABG 10/2015, persistent AFib not on anticoagulation due to prior GI bleed, esophageal adenocarcinoma, DM2, CKD, HTN, HL, COPD, and chronic diastolic CHF admitted for acute HF in the setting of afib w/ RVR. Echo with reduced EF, down to 35-40% (previously normal). Also with right sided HF. No plans for ischemic w/u given multiple comorbidities including esophageal cancer and stage IV CKD.   Assessment & Plan    1. Acute on Chronic Systolic and Diastolic HF/ Biventricular HF:  Labs suggestive of significant RV dysfunction w/ elevated LFTs (hepatic congestion). LVEF reduced, down to 35-40% (previously normal 55-60% 04/2016). RV is also moderately dilated and RV systolic function severely reduced by echo.  2. Atrial Fibrillation: 3. Acute on Chronic Kidney Disease: 4. Hypokalemia:  5. Anemia of Chronic Disease:  6. Esophageal Adenocarcinoma:  7. DM:  82 year old male admitted with acute  on chronic combined biventricular failure. Great diuresis with iv lasix, -11 lbs overnight, now 181 lbs, baseline 184-193 lbs, improved Crea 1.7 -> 1.4->1.0->0.98. Coox on 9/23 58%, on 9/24 66%, today's pending. He is in a-fib with high heart rates 90-115, I will continue lasix to 40 mg iv TID. Replaced potassium, started spironolactine 12.5 mg po daily. CXR done on 9/24 shows improved pulmonary edema, but persistent B/L pleural effusions.  The patient is now euvolemic, I would switch to PO lasix, Crea stable and normal.   Goal for today:  -  d/c iv lasix, start lasix 80 mg PO BID - mobilize!!!, consult PT&OT, case manager for discharge planning - anticipated discharge on 9/27  For questions or updates, please contact [place] Please consult [email or web address] for contact info under Cardiology/STEMI.  Ena Dawley, MD 10/04/2016

## 2016-10-04 NOTE — Progress Notes (Signed)
Pt dtr called to inform that they would like [facility] for SNF- updated facility they can accept when stable  Jorge Ny, [place] Social Worker [phone]

## 2016-10-04 NOTE — Progress Notes (Signed)
Patient ID: Mark Rivers, male   DOB: September 21, 1934, 82 y.o.   MRN: [id number]  PROGRESS NOTE    Dyke Weible  KGM:[id number] DOB: [birth year] DOA: 09/21/2016  PCP: [name], MD   Brief Narrative:   76 male Atrial fibrillation CHad2Vasc2 score=4, CAD,  CVA,  diastolic CHF,  DM,  esophageal carcinoma, recent hospitalization for decompensated CHF (8/23 - 8/25), has been experiencing LE edema since discharge.    He was admitted for acute decompensated CHF on 9/14 and had been recently admitted 08/31/16. Seen by cardio in consultation.  GI consulted for abnormal LFT's.  Assessment & Plan:     Acute on chronic respiratory failure with hypoxia (HCC) / Acute on chronic diastolic congestive heart failure (HCC) - Continue lasix 80 mg IV TID--->40 tid-->80 bid as per Crds - LVEF reduced down to 35-40% (previosuly normal 55-60%) - He is negative 6.29 L     Diabetes mellitus with stage 3 chronic kidney disease (HCC) - Continue Lantus 8 units at bedtime and sliding scale insulin - CBG's in past 24 hours: 118-171    Abnormal LFT's Possible transaminitis from passive congestion from CHF - GI signed off 9/23 - US unremarkable - Continue to trend LFT's    Persistent atrial fibrillation (HCC) - CHA2DS2-VASc Score 3 - Not on AC due to esophageal Ca and prior GI bleed  - Continue metoprolol 37.5bid  for rate control     Acute kidney failure superimposed on CKD stage 4 - Cr now trending downward - started per cardiology po lasix 80 bid    Essential hypertension - Continue metoprolol     Hypokalemia - Supplemented  - Follow up BMP in am    Anemia of chronic disease - Likely due to esophageal adenocarcinoma - Hemoglobin is stable at 10    Esophageal adenocarcinoma - Palliative consulted for goals of care we appreciate their follow-up  Possible dementia -The patient's sundowns 1600 -Patient also is on trazodone 100 which I will decrease to 50 mg daily-->25  hs -ammonia was 34 and I do not think that patient has any metabolic encephalopathy from liver dysfunction I do think that this is probably related to sundowning as well as iatrogenic medication effect and I have explained this clearly to the daughter who understands    DVT prophylaxis: Lovenox subQ Code Status: DNR/DNI Family Communication: long discussion with daughter at the bedside Disposition Plan: not yet stable for discharge    Consultants:   Gastroenterology, Dr. Enis Gash  Cardiology   Palliative care   Procedures:   ECHO 9/15 - EF 35-40%  PICC placed 9/23  Antimicrobials:   None    Subjective:  Less confused however having a little bit of word finding difficulty and wasquite sleepy when I woke him to discuss these things with him He seems a lot more cognitively intact and more fluent with his speech but he still has some difficulty searching for answers to questions-in addition he was not able to remember what he ate his morning and cannot tell me his daughter's name whereas he could do this yesterday He does not have any pain  Objective: Vitals:   10/03/16 2102 10/04/16 0425 10/04/16 0752 10/04/16 0941  BP: 136/73 123/83 134/78 (!) 115/58  Pulse: (!) 116 96 93 (!) 102  Resp: 17 18 18    Temp: 97.8 F (36.6 C) 98 F (36.7 C) 97.8 F (36.6 C)   TempSrc: Oral Oral Oral   SpO2: 97% 99% 98%   Weight:  79.8  kg (176 lb)    Height:        Intake/Output Summary (Last 24 hours) at 10/04/16 1102 Last data filed at 10/04/16 1003  Gross per 24 hour  Intake              780 ml  Output             1951 ml  Net            -1171 ml   Filed Weights   10/02/16 0500 10/03/16 0439 10/04/16 0425  Weight: 82.2 kg (181 lb 4.8 oz) 86.6 kg (191 lb) 79.8 kg (176 lb)    Physical Exam   Awake seems more alert but paradoxically is a little bit confused Anicteric no pallor No JVD, no bruit Holosystolic murmur left upper sternal edge no radiation Abdomen soft no  organomegaly no rebound No lower extremity edema Chest has no rales no rhonchi Neurologically he's intactat buthe is  slightly confused    Data Reviewed: I have personally reviewed following labs and imaging studies  CBC:  Recent Labs Lab 09/29/16 0742 09/30/16 0529 10/02/16 0451 10/03/16 0402 10/04/16 0446  WBC 15.0* 14.4* 17.0* 14.4* 15.2*  NEUTROABS  --  11.9*  --   --  12.2*  HGB 9.9* 10.1* 10.0* 9.7* 9.6*  HCT 32.7* 33.7* 34.2* 32.4* 32.0*  MCV 85.2 85.5 87.5 86.6 86.5  PLT 435* 413* 392 328 062   Basic Metabolic Panel:  Recent Labs Lab 09/29/16 0742 09/30/16 0529 10/02/16 0451 10/03/16 0402 10/04/16 0446  NA 135 138 142 142 142  K 4.0 3.4* 3.4* 3.9 3.7  CL 94* 96* 95* 94* 92*  CO2 32 34* 39* 37* 42*  GLUCOSE 84 113* 128* 134* 120*  BUN 102* 89* 62* 51* 41*  CREATININE 1.73* 1.41* 1.07 0.98 0.96  CALCIUM 8.5* 8.6* 8.9 8.9 9.0   GFR: Estimated Creatinine Clearance: 62.2 mL/min (by C-G formula based on SCr of 0.96 mg/dL). Liver Function Tests:  Recent Labs Lab 09/29/16 0742 09/30/16 0529 10/02/16 0451 10/03/16 0402 10/04/16 0446  AST 547* 432* 244* 186* 142*  ALT 447* 407* 311* 270* 229*  ALKPHOS 127* 153* 144* 128* 130*  BILITOT 1.5* 1.8* 1.9* 1.9* 1.6*  PROT 5.9* 6.1* 5.8* 5.8* 6.1*  ALBUMIN 2.5* 2.5* 2.5* 2.5* 2.6*   No results for input(s): LIPASE, AMYLASE in the last 168 hours.  Recent Labs Lab 09/27/16 1150  AMMONIA 34   Coagulation Profile:  Recent Labs Lab 09/28/16 0417 09/29/16 0742 09/30/16 0529 10/01/16 0731 10/04/16 0446  INR 2.01 1.85 1.57 1.39 1.25   Cardiac Enzymes: No results for input(s): CKTOTAL, CKMB, CKMBINDEX, TROPONINI in the last 168 hours. BNP (last 3 results) No results for input(s): PROBNP in the last 8760 hours. HbA1C: No results for input(s): HGBA1C in the last 72 hours. CBG:  Recent Labs Lab 10/03/16 0753 10/03/16 1226 10/03/16 1654 10/03/16 2154 10/04/16 0719  GLUCAP 114* 192* 192* 155* 118*    Lipid Profile: No results for input(s): CHOL, HDL, LDLCALC, TRIG, CHOLHDL, LDLDIRECT in the last 72 hours. Thyroid Function Tests: No results for input(s): TSH, T4TOTAL, FREET4, T3FREE, THYROIDAB in the last 72 hours. Anemia Panel: No results for input(s): VITAMINB12, FOLATE, FERRITIN, TIBC, IRON, RETICCTPCT in the last 72 hours. Urine analysis:    Component Value Date/Time   COLORURINE YELLOW 09/22/2016 1400   APPEARANCEUR CLEAR 09/22/2016 1400   LABSPEC 1.012 09/22/2016 1400   PHURINE 5.0 09/22/2016 1400   GLUCOSEU NEGATIVE 09/22/2016 1400  HGBUR MODERATE (A) 09/22/2016 1400   BILIRUBINUR NEGATIVE 09/22/2016 1400   KETONESUR NEGATIVE 09/22/2016 1400   PROTEINUR 30 (A) 09/22/2016 1400   UROBILINOGEN 0.2 10/26/2008 1315   NITRITE NEGATIVE 09/22/2016 1400   LEUKOCYTESUR NEGATIVE 09/22/2016 1400   Sepsis Labs: @LABRCNTIP (procalcitonin:4,lacticidven:4)    Recent Results (from the past 240 hour(s))  Culture, blood (Routine X 2) w Reflex to ID Panel     Status: None   Collection Time: 09/26/16  3:33 PM  Result Value Ref Range Status   Specimen Description BLOOD LEFT ARM  Final   Special Requests IN PEDIATRIC BOTTLE Blood Culture adequate volume  Final   Culture NO GROWTH 5 DAYS  Final   Report Status 10/01/2016 FINAL  Final  Culture, blood (Routine X 2) w Reflex to ID Panel     Status: None   Collection Time: 09/26/16  3:36 PM  Result Value Ref Range Status   Specimen Description BLOOD RIGHT ARM  Final   Special Requests IN PEDIATRIC BOTTLE Blood Culture adequate volume  Final   Culture NO GROWTH 5 DAYS  Final   Report Status 10/01/2016 FINAL  Final      Radiology Studies: Dg Chest Port 1 View  Result Date: 10/02/2016 CLINICAL DATA:  Cough.  Dyspnea.  Hypoxia. EXAM: PORTABLE CHEST 1 VIEW COMPARISON:  10/01/2016 chest radiograph. FINDINGS: Right PICC terminates in the lower third of the superior vena cava. Intact sternotomy wires. Stable cardiomediastinal silhouette  with mild cardiomegaly. No pneumothorax. Stable small bilateral pleural effusions and hazy bibasilar lung opacities. No overt pulmonary edema. IMPRESSION: 1. Stable mild cardiomegaly without overt pulmonary edema. 2. Stable small bilateral pleural effusions with hazy bibasilar lung opacities, favor atelectasis. Electronically Signed   By: Ilona Sorrel M.D.   On: 10/02/2016 12:28   Dg Chest Port 1 View  Result Date: 10/01/2016 CLINICAL DATA:  Central line placement EXAM: PORTABLE CHEST 1 VIEW COMPARISON:  09/26/2016 FINDINGS: Right arm PICC tip has been placed with the tip in the SVC. Bibasilar airspace disease and bilateral effusions unchanged from prior exam. Prior CABG IMPRESSION: Right arm PICC tip in the SVC in good position Bibasilar atelectasis and effusion unchanged. Electronically Signed   By: Franchot Gallo M.D.   On: 10/01/2016 16:24        Scheduled Meds: . enoxaparin (LOVENOX) injection  40 mg Subcutaneous Daily  . feeding supplement (ENSURE ENLIVE)  237 mL Oral BID BM  . furosemide  80 mg Oral BID  . insulin aspart  0-9 Units Subcutaneous TID WC  . insulin glargine  8 Units Subcutaneous QHS  . metoprolol succinate  37.5 mg Oral BID  . pantoprazole  40 mg Oral Q0600  . spironolactone  12.5 mg Oral Daily  . traZODone  50 mg Oral QHS  . vitamin C  500 mg Oral Daily   Continuous Infusions:   LOS: 12 days    Time spent: 25 minutes  Greater than 50% of the time spent on counseling and coordinating the care.   Nita Sells, MD [facility] Pager [phone]  If 7PM-7AM, please contact night-coverage [email or web address] Password [email or web address] 10/04/2016, 11:02 AM

## 2016-10-05 LAB — GLUCOSE, CAPILLARY
GLUCOSE-CAPILLARY: 102 mg/dL — AB (ref 65–99)
Glucose-Capillary: 155 mg/dL — ABNORMAL HIGH (ref 65–99)

## 2016-10-05 MED ORDER — FUROSEMIDE 80 MG PO TABS: 80.0000 mg | ORAL_TABLET | Freq: Two times a day (BID) | ORAL | Status: AC

## 2016-10-05 MED ORDER — TRAZODONE HCL 50 MG PO TABS: 25.0000 mg | ORAL_TABLET | Freq: Every day | ORAL | 0 refills | Status: AC

## 2016-10-05 MED ORDER — METOPROLOL SUCCINATE ER 25 MG PO TB24: 37.5000 mg | ORAL_TABLET | Freq: Two times a day (BID) | ORAL | 0 refills | Status: AC

## 2016-10-05 MED ORDER — INSULIN GLARGINE 300 UNIT/ML ~~LOC~~ SOPN: 8.0000 [IU] | PEN_INJECTOR | Freq: Every day | SUBCUTANEOUS | 0 refills | Status: AC

## 2016-10-05 MED ORDER — SPIRONOLACTONE 25 MG PO TABS: 12.5000 mg | ORAL_TABLET | Freq: Every day | ORAL | 0 refills | Status: AC

## 2016-10-05 NOTE — Care Management Important Message (Signed)
Important Message  Patient Details  Name: Mark Rivers MRN: [id number] Date of Birth: [birth year]   Medicare Important Message Given:  Yes    [name] 10/05/2016, 9:29 AM

## 2016-10-05 NOTE — Discharge Summary (Addendum)
Physician Discharge Summary  Hendricks Schwandt VQQ:[id number] DOB: 1934/07/31 DOA: 09/21/2016  PCP: [name], MD  Admit date: 09/21/2016 Discharge date: 10/05/2016  Time spent: 50 minutes  Recommendations for Outpatient Follow-up:  1. I have cut back the dose of trazodone from 125 mg at bedtime and would change this to at bedtime when necessary as an outpatient, would also completely discontinue tramadolt skilled facility as this makes the patient anxious and jittery 2. Will need titration of beta blocker if necessary-dose was cut back from metoprolol50--->37.5ii/day this admission 3. Note dosage change of Lantus as well from 30-->8 units 4.  patient will need palliative care input as an outpatient regarding his esophageal cancer, he has dual diagnosis as qualifications as he also has moderate dementia  Discharge Diagnoses:  Principal Problem:   Acute on chronic respiratory failure with hypoxia ([place]) Active Problems:   Diabetes mellitus with stage 3 chronic kidney disease ([place])   Persistent atrial fibrillation (HCC)   Acute on chronic diastolic congestive heart failure (HCC)   Anemia of chronic disease   Palliative care by specialist   DNR (do not resuscitate)   Acute renal failure ([place])   Atrial fibrillation with RVR ([place])   Transient alteration of awareness   Abnormal liver function tests   CHF (congestive heart failure) ([place])   ARF (acute renal failure) ([place])   Advance care planning   Goals of care, counseling/discussion   Discharge Condition: mproved  Diet recommendation: heart healthylow-salt  Filed Weights   10/03/16 0439 10/04/16 0425 10/05/16 0534  Weight: 86.6 kg (191 lb) 79.8 kg (176 lb) 80.6 kg (177 lb 11.1 oz)    History of present illness:  66 male Atrial fibrillation CHad2Vasc2 score=4, CAD,  CVA,  diastolic CHF,  DM,  esophageal carcinoma, recent hospitalization for decompensated CHF (8/23 - 8/25), has been experiencing LE edema since discharge.     He was admitted for acute decompensated CHF on 9/14 and had been recently admitted 08/31/16. Seen by cardio in consultation. GI consulted for abnormal LFT's.   Hospital Course:   Acute on chronic respiratory failure with hypoxia (HCC) / Acute on chronic diastolic congestive heart failure (HCC) - Continue lasix 80 mg IV TID--->40 tid-->80 bid as per Crds, will also be continuing Aldactone 12.5 daily on discharge - LVEF reduced down to 35-40% (previosuly normal 55-60%) - He is negative 9 L since admission and seems euvolemic by exam   Diabetes mellitus with stage 3 chronic kidney disease (HCC) - Continue Lantus 8 units at bedtime and sliding scale insulin, prior to admission he was on 30 units of long-acting insulin - CBG's in past 24 hours: 100 range  Abnormal LFT's Possible transaminitis from passive congestion from CHF - GI signed off 9/23 - US unremarkable - Continue to trend LFT's  Persistent atrial fibrillation ([place]) - CHA2DS2-VASc Score3 - Not on AC due to esophageal Ca and prior GI bleed  - Continue metoprolol 37.5bid  for rate control   Acute kidney failure superimposed on CKD stage 4 - Cr now trending downward - increased from prior to admission 60 2 the dose of cardiology po lasix 80 bid  Essential hypertension - Continue metoprolol note that the doses has changed as above  Hypokalemia - Supplemented  - Follow up BMP in am  Anemia of chronic disease - Likely due to esophageal adenocarcinoma - Hemoglobin is stable at 10  Esophageal adenocarcinoma - Palliative consulted for goals of care we appreciate their follow-up  Possible dementia -The patient's  sundowns 1600 -Patient also is on trazodone 100 which I will decrease to 50 mg daily-->25 hs -ammonia was 34 and I do not think that patient has any metabolic encephalopathy from liver dysfunction I do think that this is probably related to sundowning as well as iatrogenic medication  effect and I have explained this clearly to the daughter who understands  Procedures:  Echocardiogram 9/15  Left ventricle: Normal cavity size, wall thickness, left ventricular diastolic function and left atrial pressure. LV systolic function is normal with an EF of 55-60%. There are no obvious wall motion abnormalities.  Septum: No Patent Foramen Ovale present.  Left atrium: Patent foramen ovale not present.  Aortic valve: The valve is trileaflet. Mild valve thickening present. Mildly decreased leaflet separation. No stenosis. Trace regurgitation. No AV vegetation.  Mitral valve: Mild leaflet thickening is present. Mild regurgitation.  Right ventricle: Normal cavity size, wall thickness and ejection fraction.     Consultations:  cardiology  Discharge Exam: Vitals:   10/05/16 0534 10/05/16 0940  BP: (!) 148/70 (!) 150/73  Pulse: 65 99  Resp: 18   Temp: 98 F (36.7 C)   SpO2: 94% 94%    General: more alert pleasant and can currently name his daughter as well as the place Cardiovascular: S1-S2 no murmur rub or gallop no rales no rhonchi Respiratory: clinically clear Abdomen soft nontender nondistended no rebound Still some mild confusion but has improved  Discharge Instructions   Discharge Instructions    Diet - low sodium heart healthy    Complete by:  As directed    Discharge instructions    Complete by:  As directed    Increase activity slowly    Complete by:  As directed      Current Discharge Medication List    START taking these medications   Details  spironolactone (ALDACTONE) 25 MG tablet Take 0.5 tablets (12.5 mg total) by mouth daily. Qty: 30 tablet, Refills: 0      CONTINUE these medications which have CHANGED   Details  furosemide (LASIX) 80 MG tablet Take 1 tablet (80 mg total) by mouth 2 (two) times daily.    Insulin Glargine (TOUJEO SOLOSTAR) 300 UNIT/ML SOPN Inject 8 Units into the skin at bedtime. Qty: 3 mL, Refills: 0    metoprolol  succinate (TOPROL-XL) 25 MG 24 hr tablet Take 1.5 tablets (37.5 mg total) by mouth 2 (two) times daily. Qty: 60 tablet, Refills: 0    traZODone (DESYREL) 50 MG tablet Take 0.5 tablets (25 mg total) by mouth at bedtime. Qty: 5 tablet, Refills: 0      CONTINUE these medications which have NOT CHANGED   Details  atorvastatin (LIPITOR) 80 MG tablet Take 1 tablet (80 mg total) by mouth daily. Qty: 90 tablet, Refills: 3    cholecalciferol (VITAMIN D) 1000 units tablet Take 1,000 Units by mouth daily.    diphenhydramine-acetaminophen (TYLENOL PM) 25-500 MG TABS tablet Take 2 tablets by mouth at bedtime.    fenofibrate 54 MG tablet Take 54 mg by mouth daily.    Glucosamine-Chondroit-Vit C-Mn (GLUCOSAMINE CHONDR 1500 COMPLX PO) Take 2 tablets by mouth daily.     OXYGEN Inhale 2 L/min into the lungs continuous.    pantoprazole (PROTONIX) 40 MG tablet Take 1 tablet (40 mg total) by mouth daily at 6 (six) AM. Qty: 30 tablet, Refills: 0    vitamin C (ASCORBIC ACID) 500 MG tablet Take 500 mg by mouth daily.      STOP taking these  medications     traMADol (ULTRAM) 50 MG tablet        Allergies  Allergen Reactions  . Amoxicillin Other (See Comments)    PATIENT PASSED OUT   . Cephalexin Other (See Comments)    PATIENT PASSED OUT  . Penicillin G Other (See Comments)    PATIENT PASSED OUT. Has patient had a PCN reaction causing immediate rash, facial/tongue/throat swelling, SOB or lightheadedness with hypotension: Yes Has patient had a PCN reaction causing severe rash involving mucus membranes or skin necrosis: No Has patient had a PCN reaction that required hospitalization: Yes Has patient had a PCN reaction occurring within the last 10 years: Yes If all of the above answers are "NO", then may proceed with Cephalosporin   Contact information for after-discharge care    [facility] .   Specialty:  [place] information: [address] [place] [phone]               The results of significant diagnostics from this hospitalization (including imaging, microbiology, ancillary and laboratory) are listed below for reference.    Significant Diagnostic Studies: Dg Shoulder Right  Result Date: 09/22/2016 CLINICAL DATA:  Right shoulder pain. EXAM: RIGHT SHOULDER - 2+ VIEW COMPARISON:  07/10/2016. FINDINGS: Acromioclavicular and glenohumeral degenerative change. No acute abnormality identified. No evidence of fracture or dislocation. Prior median sternotomy. IMPRESSION: Degenerative changes right shoulder. No acute bony abnormality identified. No evidence of fracture or dislocation. Electronically Signed   By: Marcello Moores  Register   On: 09/22/2016 10:00   Ct Head Wo Contrast  Result Date: 09/27/2016 CLINICAL DATA:  Acute confusion EXAM: CT HEAD WITHOUT CONTRAST TECHNIQUE: Contiguous axial images were obtained from the base of the skull through the vertex without intravenous contrast. COMPARISON:  08/31/2016 FINDINGS: Brain: Diffuse atrophic changes and chronic white matter ischemic changes are noted. No findings to suggest acute hemorrhage, acute infarction or space-occupying mass lesion are seen. Basal ganglia calcifications are noted bilaterally. Vascular: No hyperdense vessel or unexpected calcification. Skull: Normal. Negative for fracture or focal lesion. Sinuses/Orbits: No acute finding. Other: None. IMPRESSION: Chronic atrophic and ischemic changes stable from the prior exam. No acute abnormality noted. Electronically Signed   By: Inez Catalina M.D.   On: 09/27/2016 14:04   US Renal  Result Date: 09/22/2016 CLINICAL DATA:  Acute renal failure. EXAM: RENAL / URINARY TRACT ULTRASOUND COMPLETE COMPARISON:  CT 02/21/2016.  Ultrasound 02/10/2016 . FINDINGS: Right Kidney: Length: 11.5 cm. Echogenicity within normal limits. No mass or hydronephrosis visualized. Left Kidney: Length: 11.2 cm.  Echogenicity within normal limits. No mass or hydronephrosis visualized. Bladder: Appears normal for degree of bladder distention. IMPRESSION: Negative exam. Electronically Signed   By: Marcello Moores  Register   On: 09/22/2016 10:20   Dg Chest Port 1 View  Result Date: 10/02/2016 CLINICAL DATA:  Cough.  Dyspnea.  Hypoxia. EXAM: PORTABLE CHEST 1 VIEW COMPARISON:  10/01/2016 chest radiograph. FINDINGS: Right PICC terminates in the lower third of the superior vena cava. Intact sternotomy wires. Stable cardiomediastinal silhouette with mild cardiomegaly. No pneumothorax. Stable small bilateral pleural effusions and hazy bibasilar lung opacities. No overt pulmonary edema. IMPRESSION: 1. Stable mild cardiomegaly without overt pulmonary edema. 2. Stable small bilateral pleural effusions with hazy bibasilar lung opacities, favor atelectasis. Electronically Signed   By: Ilona Sorrel M.D.   On: 10/02/2016 12:28   Dg Chest Port 1 View  Result Date: 10/01/2016 CLINICAL DATA:  Central line  placement EXAM: PORTABLE CHEST 1 VIEW COMPARISON:  09/26/2016 FINDINGS: Right arm PICC tip has been placed with the tip in the SVC. Bibasilar airspace disease and bilateral effusions unchanged from prior exam. Prior CABG IMPRESSION: Right arm PICC tip in the SVC in good position Bibasilar atelectasis and effusion unchanged. Electronically Signed   By: Franchot Gallo M.D.   On: 10/01/2016 16:24   Dg Chest Port 1 View  Result Date: 09/26/2016 CLINICAL DATA:  Congestive heart failure, followup EXAM: PORTABLE CHEST 1 VIEW COMPARISON:  Portable chest x-ray of 09/24/2016 and CT chest of 02/21/2016 FINDINGS: The lungs are not optimally aerated. Therefore there is some volume loss at the lung bases. There does appear to be a left pleural effusion and possibly a small right pleural effusion as well. Cardiomegaly is stable and there may be mild pulmonary vascular congestion suggesting mild CHF. Median sternotomy sutures are noted from prior CABG.  No acute bony abnormality is seen. IMPRESSION: Probable mild CHF with cardiomegaly, small effusions, and pulmonary vascular congestion. Electronically Signed   By: Ivar Drape M.D.   On: 09/26/2016 14:52   Dg Chest Port 1 View  Result Date: 09/24/2016 CLINICAL DATA:  Hypoxia. EXAM: PORTABLE CHEST 1 VIEW COMPARISON:  09/21/2016 FINDINGS: Prior median sternotomy. Midline trachea. Cardiomegaly accentuated by AP portable technique. Developing small left pleural effusion. Probable layering right pleural effusion, small. No pneumothorax. Low lung volumes. Interstitial edema is greater on the right and slightly increased. Left base volume loss with developing airspace disease in both lung bases. IMPRESSION: Slightly worsened aeration, with increased asymmetric interstitial edema. Developing left and likely right pleural effusions with bibasilar airspace disease, favoring atelectasis. Electronically Signed   By: Abigail Miyamoto M.D.   On: 09/24/2016 15:54   Dg Chest Portable 1 View  Result Date: 09/21/2016 CLINICAL DATA:  Swelling of the legs and abdomen. Atrial fibrillation with rapid ventricular response. EXAM: PORTABLE CHEST 1 VIEW COMPARISON:  09/02/2016 FINDINGS: Stable cardiomegaly with median sternotomy sutures. Aortic atherosclerosis is again noted at the arch. Mild central vascular congestion consistent with CHF. Probable small pleural effusions blunting the costophrenic angles. No acute nor suspicious osseous abnormalities. IMPRESSION: Stable cardiomegaly with mild CHF. Trace bilateral pleural effusions believed to account for blunting of the costophrenic angles. Aortic atherosclerosis. Electronically Signed   By: Ashley Royalty M.D.   On: 09/21/2016 18:43   US Abdomen Limited Ruq  Result Date: 09/26/2016 CLINICAL DATA:  Congestive heart failure. EXAM: ULTRASOUND ABDOMEN LIMITED RIGHT UPPER QUADRANT COMPARISON:  Ultrasound kidneys 09/22/2016. CT abdomen and pelvis 02/21/2016 FINDINGS: Gallbladder: No  gallstones or wall thickening visualized. No sonographic Murphy sign noted by sonographer. Common bile duct: Diameter: 3 mm, normal Liver: Diffusely increased hepatic parenchymal echotexture suggesting fatty infiltration. No focal lesions identified. Portal vein is patent on color Doppler imaging with normal direction of blood flow towards the liver. IMPRESSION: Diffuse fatty infiltration of the liver. No evidence of cholelithiasis or cholecystitis. Electronically Signed   By: Lucienne Capers M.D.   On: 09/26/2016 21:15    Microbiology: Recent Results (from the past 240 hour(s))  Culture, blood (Routine X 2) w Reflex to ID Panel     Status: None   Collection Time: 09/26/16  3:33 PM  Result Value Ref Range Status   Specimen Description BLOOD LEFT ARM  Final   Special Requests IN PEDIATRIC BOTTLE Blood Culture adequate volume  Final   Culture NO GROWTH 5 DAYS  Final   Report Status 10/01/2016 FINAL  Final  Culture,  blood (Routine X 2) w Reflex to ID Panel     Status: None   Collection Time: 09/26/16  3:36 PM  Result Value Ref Range Status   Specimen Description BLOOD RIGHT ARM  Final   Special Requests IN PEDIATRIC BOTTLE Blood Culture adequate volume  Final   Culture NO GROWTH 5 DAYS  Final   Report Status 10/01/2016 FINAL  Final     Labs: Basic Metabolic Panel:  Recent Labs Lab 09/29/16 0742 09/30/16 0529 10/02/16 0451 10/03/16 0402 10/04/16 0446  NA 135 138 142 142 142  K 4.0 3.4* 3.4* 3.9 3.7  CL 94* 96* 95* 94* 92*  CO2 32 34* 39* 37* 42*  GLUCOSE 84 113* 128* 134* 120*  BUN 102* 89* 62* 51* 41*  CREATININE 1.73* 1.41* 1.07 0.98 0.96  CALCIUM 8.5* 8.6* 8.9 8.9 9.0   Liver Function Tests:  Recent Labs Lab 09/29/16 0742 09/30/16 0529 10/02/16 0451 10/03/16 0402 10/04/16 0446  AST 547* 432* 244* 186* 142*  ALT 447* 407* 311* 270* 229*  ALKPHOS 127* 153* 144* 128* 130*  BILITOT 1.5* 1.8* 1.9* 1.9* 1.6*  PROT 5.9* 6.1* 5.8* 5.8* 6.1*  ALBUMIN 2.5* 2.5* 2.5* 2.5*  2.6*   No results for input(s): LIPASE, AMYLASE in the last 168 hours. No results for input(s): AMMONIA in the last 168 hours. CBC:  Recent Labs Lab 09/29/16 0742 09/30/16 0529 10/02/16 0451 10/03/16 0402 10/04/16 0446  WBC 15.0* 14.4* 17.0* 14.4* 15.2*  NEUTROABS  --  11.9*  --   --  12.2*  HGB 9.9* 10.1* 10.0* 9.7* 9.6*  HCT 32.7* 33.7* 34.2* 32.4* 32.0*  MCV 85.2 85.5 87.5 86.6 86.5  PLT 435* 413* 392 328 337   Cardiac Enzymes: No results for input(s): CKTOTAL, CKMB, CKMBINDEX, TROPONINI in the last 168 hours. BNP: BNP (last 3 results)  Recent Labs  08/18/16 1058 08/31/16 1314 09/21/16 1823  BNP 1,191.6* 2,085.6* 1,875.7*    ProBNP (last 3 results) No results for input(s): PROBNP in the last 8760 hours.  CBG:  Recent Labs Lab 10/04/16 0719 10/04/16 1112 10/04/16 1609 10/04/16 2107 10/05/16 0725  GLUCAP 118* 171* 138* 120* 102*       Signed:  Nita Sells MD   [facility] 10/05/2016, 10:06 AM

## 2016-10-05 NOTE — Progress Notes (Signed)
Palliative Medicine RN Note: Rec'd call from daughter that she has HCPOA paperwork. Copy made for both [facility] record and to send to NF. LM with SW to notify her of copy for NF. Pt denies pain and SOB and is ready to "get out of here."  Floreine Kingdon G. Nautika Cressey, RN, BSN, [facility] 10/05/2016 12:51 PM Cell [phone] 8:00-4:00 Monday-Friday Office [phone]

## 2016-10-05 NOTE — Progress Notes (Signed)
Progress Note  Patient Name: Mark Rivers Date of Encounter: 10/05/2016  Primary Cardiologist: Dr. Johnsie Cancel   Subjective   The patient is feeling better, he is much more alert and was walking with PT yesterday.  Inpatient Medications    Scheduled Meds: . enoxaparin (LOVENOX) injection  40 mg Subcutaneous Daily  . feeding supplement (ENSURE ENLIVE)  237 mL Oral BID BM  . furosemide  80 mg Oral BID  . insulin aspart  0-9 Units Subcutaneous TID WC  . insulin glargine  8 Units Subcutaneous QHS  . metoprolol succinate  37.5 mg Oral BID  . pantoprazole  40 mg Oral Q0600  . spironolactone  12.5 mg Oral Daily  . traZODone  25 mg Oral QHS  . vitamin C  500 mg Oral Daily   Continuous Infusions:  PRN Meds: acetaminophen, guaiFENesin-dextromethorphan, metoprolol tartrate, ondansetron (ZOFRAN) IV, sodium chloride flush   Vital Signs    Vitals:   10/04/16 0941 10/04/16 1211 10/04/16 1935 10/05/16 0534  BP: (!) 115/58 120/71 (!) 142/86 (!) 148/70  Pulse: (!) 102 90 77 65  Resp:  20 18 18   Temp:  98 F (36.7 C) 98.3 F (36.8 C) 98 F (36.7 C)  TempSrc:  Oral Oral Oral  SpO2:  95% 96% 94%  Weight:    177 lb 11.1 oz (80.6 kg)  Height:        Intake/Output Summary (Last 24 hours) at 10/05/16 0803 Last data filed at 10/05/16 0500  Gross per 24 hour  Intake              900 ml  Output             2225 ml  Net            -1325 ml   Filed Weights   10/03/16 0439 10/04/16 0425 10/05/16 0534  Weight: 191 lb (86.6 kg) 176 lb (79.8 kg) 177 lb 11.1 oz (80.6 kg)    Telemetry    afib in the low 100s.- Personally Reviewed  ECG    09/21/16 atrial fibrillation w/ RVR 145 bpm - Personally Reviewed  Physical Exam   GEN: No acute distress.   Neck: No JVD Cardiac: irreguarlly irregular, no murmurs, rubs, or gallops.  Respiratory: CTA B/L GI: Soft, nontender, non-distended  MS: minimal LE edema; No deformity. Neuro:  Nonfocal  Psych: Normal affect   Labs     Chemistry  Recent Labs Lab 10/02/16 0451 10/03/16 0402 10/04/16 0446  NA 142 142 142  K 3.4* 3.9 3.7  CL 95* 94* 92*  CO2 39* 37* 42*  GLUCOSE 128* 134* 120*  BUN 62* 51* 41*  CREATININE 1.07 0.98 0.96  CALCIUM 8.9 8.9 9.0  PROT 5.8* 5.8* 6.1*  ALBUMIN 2.5* 2.5* 2.6*  AST 244* 186* 142*  ALT 311* 270* 229*  ALKPHOS 144* 128* 130*  BILITOT 1.9* 1.9* 1.6*  GFRNONAA >60 >60 >60  GFRAA >60 >60 >60  ANIONGAP 8 11 8      Hematology  Recent Labs Lab 10/02/16 0451 10/03/16 0402 10/04/16 0446  WBC 17.0* 14.4* 15.2*  RBC 3.91* 3.74* 3.70*  HGB 10.0* 9.7* 9.6*  HCT 34.2* 32.4* 32.0*  MCV 87.5 86.6 86.5  MCH 25.6* 25.9* 25.9*  MCHC 29.2* 29.9* 30.0  RDW 17.6* 18.0* 18.0*  PLT 392 328 337    Cardiac EnzymesNo results for input(s): TROPONINI in the last 168 hours. No results for input(s): TROPIPOC in the last 168 hours.   BNPNo results for  input(s): BNP, PROBNP in the last 168 hours.   DDimer No results for input(s): DDIMER in the last 168 hours.   Radiology    No results found.  Cardiac Studies   Study Conclusions  - Left ventricle: The cavity size was normal. Wall thickness was   increased in a pattern of mild LVH. Systolic function was   moderately reduced. The estimated ejection fraction was in the   range of 35% to 40%. Diffuse hypokinesis. The study is not   technically sufficient to allow evaluation of LV diastolic   function. - Aortic valve: Moderately to severely calcified annulus. Mildly   thickened, mildly calcified leaflets. Morphologically, it appears   there is at least mild if not mild to moderate aortic valvular   stenosis. - Mitral valve: Calcified annulus. There was moderate to severe   regurgitation. - Left atrium: The atrium was severely dilated. - Right ventricle: The cavity size was moderately dilated. Systolic   function was severely reduced. - Right atrium: The atrium was moderately dilated. - Atrial septum: No defect or patent  foramen ovale was identified. - Tricuspid valve: There was moderate regurgitation.   Patient Profile     82 y.o. male with a hx of CAD s/p CABG 10/2015, persistent AFib not on anticoagulation due to prior GI bleed, esophageal adenocarcinoma, DM2, CKD, HTN, HL, COPD, and chronic diastolic CHF admitted for acute HF in the setting of afib w/ RVR. Echo with reduced EF, down to 35-40% (previously normal). Also with right sided HF. No plans for ischemic w/u given multiple comorbidities including esophageal cancer and stage IV CKD.   Assessment & Plan    1. Acute on Chronic Systolic and Diastolic HF/ Biventricular HF:  Labs suggestive of significant RV dysfunction w/ elevated LFTs (hepatic congestion). LVEF reduced, down to 35-40% (previously normal 55-60% 04/2016). RV is also moderately dilated and RV systolic function severely reduced by echo.  2. Atrial Fibrillation: 3. Acute on Chronic Kidney Disease: 4. Hypokalemia:  5. Anemia of Chronic Disease:  6. Esophageal Adenocarcinoma:  7. DM:  82 year old male admitted with acute on chronic combined biventricular failure. Great diuresis with iv lasix, -11 lbs overnight, now 181 lbs, baseline 184-193 lbs, improved Crea 1.7 -> 1.4->1.0->0.98. Coox on 9/23 58%, on 9/24 66%, today's pending. He is in a-fib with high heart rates 90-115, I will continue lasix to 40 mg iv TID. Replaced potassium, started spironolactine 12.5 mg po daily. CXR done on 9/24 shows improved pulmonary edema, but persistent B/L pleural effusions.  The patient is now euvolemic, continues diuresing on PO lasix, Crea stable and normal.   Goal for today:  - discharge to a rehab facility - discharge on lasix 80 mg po BID, and spironolactone 12.5 mg po daily, we will arrange for an outpatient follow up in the clinic the next week and recheck BMP and adjust lasix as needed.  For questions or updates, please contact [facility] Please consult [email or web address] for contact info under  Cardiology/STEMI.  Ena Dawley, MD 10/05/2016

## 2016-10-05 NOTE — Clinical Social Work Placement (Signed)
   CLINICAL SOCIAL WORK PLACEMENT  NOTE  Date:  10/05/2016  Patient Details  Name: Mark Rivers MRN: [id number] Date of Birth: 1934-07-09  Clinical Social Work is seeking post-discharge placement for this patient at the [place] level of care (*CSW will initial, date and re-position this form in  chart as items are completed):  Yes   Patient/family provided with [facility] Work Department's list of facilities offering this level of care within the geographic area requested by the patient (or if unable, by the patient's family).  Yes   Patient/family informed of their freedom to choose among providers that offer the needed level of care, that participate in Medicare, Medicaid or managed care program needed by the patient, have an available bed and are willing to accept the patient.  Yes   Patient/family informed of [facility]'s ownership interest in [facility] and [facility], as well as of the fact that they are under no obligation to receive care at these facilities.  PASRR submitted to EDS on 10/02/16     PASRR number received on       Existing PASRR number confirmed on 10/02/16     FL2 transmitted to all facilities in geographic area requested by pt/family on 10/02/16     FL2 transmitted to all facilities within larger geographic area on       Patient informed that his/her managed care company has contracts with or will negotiate with certain facilities, including the following:        Yes   Patient/family informed of bed offers received.  Patient chooses bed at [facility]     Physician recommends and patient chooses bed at      Patient to be transferred to [facility] on 10/05/16.  Patient to be transferred to facility by [facility]     Patient family notified on 10/05/16 of transfer.  Name of family member notified:  Bernis Schreur     PHYSICIAN Please prepare prescriptions     Additional Comment:     _______________________________________________ Candie Chroman, LCSW 10/05/2016, 2:11 PM

## 2016-10-05 NOTE — Clinical Social Work Note (Signed)
CSW facilitated patient discharge including contacting patient family and facility to confirm patient discharge plans. Clinical information faxed to facility and family agreeable with plan. CSW arranged ambulance transport via [facility] to [facility]. RN to call report prior to discharge ([phone]).  CSW will sign off for now as social work intervention is no longer needed. Please consult us again if new needs arise.  Remiel Corti, CSW [phone]   

## 2016-10-19 ENCOUNTER — Ambulatory Visit: Payer: Medicare Other | Admitting: Cardiology

## 2016-10-19 NOTE — Progress Notes (Deleted)
Cardiology Office Note   Date:  10/19/2016   ID:  Mark Rivers, DOB 05/16/1934, MRN [id number]  PCP:  [name], MD  Cardiologist:  Dr. Johnsie Cancel    No chief complaint on file.     History of Present Illness: Mark Rivers is a 82 y.o. male who presents for post hospital.   Admitted at [facility] from 09/21/16 to 10/05/16 for acute on chronic respiratory failure, acute renal failure, a fib with RVR.   He has a hx of CAD s/p CABG 10/2015, persistent AFib not on anticoagulation due to prior GI bleed, esophageal adenocarcinoma, DM2, CKD, HTN, HL, COPD, and chronic diastolic CHF.   EF on 09/23/16  was 35-40%, mild LVH,  Mild to mod AS.  Mod to severe MR.   LA severely dilated.  Mod TR  No plans for ischemic w/u given multiple comorbidities including esophageal cancer and stage IV CKD discharge on lasix 80 mg po BID, and spironolactone 12.5 mg po daily, we will arrange for an outpatient follow up in the clinic the next week and recheck BMP and adjust lasix as needed.   Past Medical History:  Diagnosis Date  . Allergy   . Back pain   . Chronic diastolic CHF (congestive heart failure) ([place]) 12/21/2015   A. Echo 10/17: Moderate LVH, EF 55-60, normal wall motion, grade 2 diastolic dysfunction, mild aortic stenosis (mean 14, peak 34), MAC, mild LAE    . Chronic respiratory failure ([place])   . CKD (chronic kidney disease) stage 3, GFR 30-59 ml/min 03/04/2014  . Coronary artery disease    a. s/p remote POBA in 1988 // b. NSTEMI in 10/17 >> LHC with 99% LM stenosis >> emergent CABG (L-LAD, S-OM2, S-PDA) - post op course complicated (prolonged ventilation, s/p Trach, AFib, anemia req transfusion, bacteremia >> DC to LTAC)  . Diabetes mellitus   . Difficult intubation    difficult airway/FYI note 11/01/2015  . Esophageal adenocarcinoma ([place])    a. found by EGD 03/2016..  . GERD (gastroesophageal reflux disease)   . GI bleed 01/2016   a. 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 F/u EGD with esophageal adenocarcinoma 03/2016.  [name] History of nuclear stress test    a. Myoview 6/14 - EF 51, no ischemia or scar; Low Risk  . Hyperlipemia   . Hypertension   . Methicillin resistant Staphylococcus epidermidis infection 11/2015   a. during adm for CABG.  . Neuropathy   . On home oxygen therapy    "2L; 24/7" (02/09/2016)  . Persistent atrial fibrillation ([place]) 10/25/2015  . Sinus bradycardia    a. Prior HR 54 when in sinus rhythm 11/2015.  . Stroke [facility])    a. old L pontine infarcts seen on CT 04/2016.    Past Surgical History:  Procedure Laterality Date  . CARDIAC CATHETERIZATION N/A 10/26/2015   Procedure: Left Heart Cath and Coronary Angiography;  Surgeon: Troy Sine, MD;  Location: [place] CV LAB;  Service: Cardiovascular;  Laterality: N/A;  . CATARACT EXTRACTION W/ INTRAOCULAR LENS  IMPLANT, BILATERAL  2008  . COLONOSCOPY    . CORONARY ANGIOPLASTY WITH STENT PLACEMENT  1988  . CORONARY ARTERY BYPASS GRAFT N/A 10/26/2015   Procedure: CORONARY ARTERY BYPASS GRAFTING (CABG) x3(LIMA to LA, SVG to OM1, SVG to PDA) with EVH from the left thigh and partial lower  leg greater saphenous vein and left internal mammary artery;  Surgeon: Ivin Poot, MD;  Location: [place];  Service: Open Heart Surgery;  Laterality: N/A;  . ESOPHAGOGASTRODUODENOSCOPY (EGD) WITH PROPOFOL N/A 02/11/2016   Procedure: ESOPHAGOGASTRODUODENOSCOPY (EGD) WITH PROPOFOL;  Surgeon: Ladene Artist, MD;  Location: [facility] ENDOSCOPY;  Service: Endoscopy;  Laterality: N/A;  . EUS N/A 03/09/2016   Procedure: UPPER ENDOSCOPIC ULTRASOUND (EUS) RADIAL;  Surgeon: Milus Banister, MD;  Location: [facility] ENDOSCOPY;  Service: Endoscopy;  Laterality: N/A;  . TEE WITHOUT CARDIOVERSION N/A 10/26/2015   Procedure: TRANSESOPHAGEAL ECHOCARDIOGRAM (TEE);  Surgeon:  Ivin Poot, MD;  Location: [place];  Service: Open Heart Surgery;  Laterality: N/A;  . TONSILLECTOMY       Current Outpatient Prescriptions  Medication Sig Dispense Refill  . atorvastatin (LIPITOR) 80 MG tablet Take 1 tablet (80 mg total) by mouth daily. 90 tablet 3  . cholecalciferol (VITAMIN D) 1000 units tablet Take 1,000 Units by mouth daily.    . diphenhydramine-acetaminophen (TYLENOL PM) 25-500 MG TABS tablet Take 2 tablets by mouth at bedtime.    . fenofibrate 54 MG tablet Take 54 mg by mouth daily.    . furosemide (LASIX) 80 MG tablet Take 1 tablet (80 mg total) by mouth 2 (two) times daily.    . Glucosamine-Chondroit-Vit C-Mn (GLUCOSAMINE CHONDR 1500 COMPLX PO) Take 2 tablets by mouth daily.     . Insulin Glargine (TOUJEO SOLOSTAR) 300 UNIT/ML SOPN Inject 8 Units into the skin at bedtime. 3 mL 0  . metoprolol succinate (TOPROL-XL) 25 MG 24 hr tablet Take 1.5 tablets (37.5 mg total) by mouth 2 (two) times daily. 60 tablet 0  . OXYGEN Inhale 2 L/min into the lungs continuous.    . pantoprazole (PROTONIX) 40 MG tablet Take 1 tablet (40 mg total) by mouth daily at 6 (six) AM. 30 tablet 0  . spironolactone (ALDACTONE) 25 MG tablet Take 0.5 tablets (12.5 mg total) by mouth daily. 30 tablet 0  . traZODone (DESYREL) 50 MG tablet Take 0.5 tablets (25 mg total) by mouth at bedtime. 5 tablet 0  . vitamin C (ASCORBIC ACID) 500 MG tablet Take 500 mg by mouth daily.     No current facility-administered medications for this visit.     Allergies:   Amoxicillin; Cephalexin; and Penicillin g    Social History:  The patient  reports that he quit smoking about 12 years ago. His smoking use included Cigarettes. He has a 60.00 pack-year smoking history. He has never used smokeless tobacco. He reports that he does not drink alcohol or use drugs.   Family History:  The patient's ***family history includes Diabetes in his father.    ROS:  General:no colds or fevers, no weight changes Skin:no  rashes or ulcers HEENT:no blurred vision, no congestion CV:see HPI PUL:see HPI GI:no diarrhea constipation or melena, no indigestion GU:no hematuria, no dysuria MS:no joint pain, no claudication Neuro:no syncope, no lightheadedness Endo:no diabetes, no thyroid disease Wt Readings from Last 3 Encounters:  10/05/16 177 lb 11.1 oz (80.6 kg)  09/02/16 196 lb 12.8 oz (89.3 kg)  08/24/16 200 lb (90.7 kg)     PHYSICAL EXAM: VS:  There were no vitals taken for this visit. , BMI There is no height or weight on file to calculate BMI. General:Pleasant affect, NAD Skin:Warm and dry, brisk capillary refill HEENT:normocephalic, sclera clear, mucus membranes moist Neck:supple, no JVD, no bruits  Heart:S1S2 RRR without murmur, gallup, rub or click Lungs:clear without rales,  rhonchi, or wheezes BTD:HRCB, non tender, + BS, do not palpate liver spleen or masses Ext:no lower ext edema, 2+ pedal pulses, 2+ radial pulses Neuro:alert and oriented, MAE, follows commands, + facial symmetry    EKG:  EKG is ordered today. The ekg ordered today demonstrates ***   Recent Labs: 11/27/2015: TSH 2.639 09/21/2016: B Natriuretic Peptide 1,875.7 09/23/2016: Magnesium 2.3 10/04/2016: ALT 229; BUN 41; Creatinine, Ser 0.96; Hemoglobin 9.6; Platelets 337; Potassium 3.7; Sodium 142    Lipid Panel    Component Value Date/Time   TRIG 268 (H) 11/05/2015 1325       Other studies Reviewed: Additional studies/ records that were reviewed today include: ***.   ASSESSMENT AND PLAN:  1.  ***   Current medicines are reviewed with the patient today.  The patient Has no concerns regarding medicines.  The following changes have been made:  See above Labs/ tests ordered today include:see above  Disposition:   FU:  see above  Signed, Cecilie Kicks, NP  10/19/2016 8:28 AM    [address], [place], [place], [place] Phone:  [phone]; Fax: [phone]

## 2016-10-20 ENCOUNTER — Encounter: Payer: Self-pay | Admitting: Cardiology

## 2016-12-05 NOTE — Progress Notes (Deleted)
Cardiology Office Note:    Date:  12/05/2016   ID:  Mark Rivers, DOB 1934/02/20, MRN [id number]  PCP:  Mark Peers, MD  Cardiologist:Dr. Jenkins Rivers Electrophysiologist: n/a Nephrology: Dr. Olivia Mackie Phoebe Rivers)  Referring MD: Mark Peers, MD   No chief complaint on file.   History of Present Illness:    Mark Rivers is a 82 y.o. male with a hx of CAD, persistent AFib, DM2, CKD, HTN, HL, COPD.  He underwent remote angioplasty in 1988.  He was admitted in 10/17 with NSTEMI in the setting of AF with RVR.  He had critical LM stenosis on LHC and underwent emergent CABG (L-LAD, S-OM2, S-PDA).  He had a complex post op course notable for blood loss anemia requiring transfusion with PRBCs, difficulty in weaning from the ventilator resulting in tracheostomy, chest tube placement for R pleural effusion, MRSE bacteremia and AKI which limited diuresis.  He was placed on Coumadin for recurrent AF.  He was DC to [facility].    He was admitted 06/4156 with a/c diastolic CHF.  He was admitted again 1/31-2/4 with an upper GI bleed c/b AKI, influenza.  He required transfusion with PRBCs x 1.  His diuretics were held due to hypotension and AKI.  These were not resumed at DC.  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  Had EGD 03/09/16 Dr Mark Rivers and had adneocarcinoma Since he is a poor surgical candidate referred to [facility] for possible endoscopic Rx   Brother has moved from [place] to be with him since [name] has died and he has been very  Helpful   Seen in ER 08/18/16 with dyspnea Normally wears 3L oxygen Nephrology Had decreased his lasix from 40 bid to daily because of his CRF  Given iv lopressor and iv lasix D/c with lasix back to 40 bid  BNP was 1191 CXR small effusions and mild  interstitial edema  Admitted September 2018 with biventricular failure Echo now with EF 35-40% DC with aldactone 12.5 mg and Lasix 80 mg bid    Study Conclusions  09/23/16   - Left ventricle: The cavity size was normal. Wall thickness was   increased in a pattern of mild LVH. Systolic function was   moderately reduced. The estimated ejection fraction was in the   range of 35% to 40%. Diffuse hypokinesis. The study is not   technically sufficient to allow evaluation of LV diastolic   function. - Aortic valve: Moderately to severely calcified annulus. Mildly   thickened, mildly calcified leaflets. Morphologically, it appears   there is at least mild if not mild to moderate aortic valvular   stenosis. - Mitral valve: Calcified annulus. There was moderate to severe   regurgitation. - Left atrium: The atrium was severely dilated. - Right ventricle: The cavity size was moderately dilated. Systolic   function was severely reduced. - Right atrium: The atrium was moderately dilated. - Atrial septum: No defect or patent foramen ovale was identified. - Tricuspid valve: There was moderate regurgitation.  Prior CV studies:   The following studies were reviewed today:  Echo: 09/23/16  Study Conclusions  - Left ventricle: The cavity size was normal. Wall thickness was   increased in a pattern of mild LVH. Systolic function was   moderately reduced. The estimated ejection fraction was in the  range of 35% to 40%. Diffuse hypokinesis. The study is not   technically sufficient to allow evaluation of LV diastolic   function. - Aortic valve: Moderately to severely calcified annulus. Mildly   thickened, mildly calcified leaflets. Morphologically, it appears   there is at least mild if not mild to moderate aortic valvular   stenosis. - Mitral valve: Calcified annulus. There was moderate to severe   regurgitation. - Left atrium: The atrium was severely dilated. - Right ventricle: The cavity  size was moderately dilated. Systolic   function was severely reduced. - Right atrium: The atrium was moderately dilated. - Atrial septum: No defect or patent foramen ovale was identified. - Tricuspid valve: There was moderate regurgitation.  LHC 10/26/15 LM distal 99 thrombotic LAD okay LCx okay RCA proximal 80, mid 30 EF 40-45  Myoview 10/16 EF 51, no ischemia or scar; Low Risk  Echo 6/14 Mild to moderate LVH, probable mild to moderately reduced LVEF, moderate LAE, MAC, mild MR, mild TR   Past Medical History:  Diagnosis Date  . Allergy   . Back pain   . Chronic diastolic CHF (congestive heart failure) ([place]) 12/21/2015   A. Echo 10/17: Moderate LVH, EF 55-60, normal wall motion, grade 2 diastolic dysfunction, mild aortic stenosis (mean 14, peak 34), MAC, mild LAE    . Chronic respiratory failure ([place])   . CKD (chronic kidney disease) stage 3, GFR 30-59 ml/min 03/04/2014  . Coronary artery disease    a. s/p remote POBA in 1988 // b. NSTEMI in 10/17 >> LHC with 99% LM stenosis >> emergent CABG (L-LAD, S-OM2, S-PDA) - post op course complicated (prolonged ventilation, s/p Trach, AFib, anemia req transfusion, bacteremia >> DC to LTAC)  . Diabetes mellitus   . Difficult intubation    difficult airway/FYI note 11/01/2015  . Esophageal adenocarcinoma ([place])    a. found by EGD 03/2016..  . GERD (gastroesophageal reflux disease)   . GI bleed 01/2016   a. 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 F/u EGD with esophageal adenocarcinoma 03/2016.  [name] History of nuclear stress test    a. Myoview 6/14 - EF 51, no ischemia or scar; Low Risk  . Hyperlipemia   . Hypertension   . Methicillin resistant Staphylococcus epidermidis infection 11/2015   a. during adm for CABG.  . Neuropathy   . On home oxygen therapy    "2L;  24/7" (02/09/2016)  . Persistent atrial fibrillation ([place]) 10/25/2015  . Sinus bradycardia    a. Prior HR 54 when in sinus rhythm 11/2015.  . Stroke [facility])    a. old L pontine infarcts seen on CT 04/2016.    Past Surgical History:  Procedure Laterality Date  . CARDIAC CATHETERIZATION N/A 10/26/2015   Procedure: Left Heart Cath and Coronary Angiography;  Surgeon: Troy Sine, MD;  Location: [place] CV LAB;  Service: Cardiovascular;  Laterality: N/A;  . CATARACT EXTRACTION W/ INTRAOCULAR LENS  IMPLANT, BILATERAL  2008  . COLONOSCOPY    . CORONARY ANGIOPLASTY WITH STENT PLACEMENT  1988  . CORONARY ARTERY BYPASS GRAFT N/A 10/26/2015   Procedure: CORONARY ARTERY BYPASS GRAFTING (CABG) x3(LIMA to LA, SVG to OM1, SVG to PDA) with EVH from the left thigh and partial lower leg greater saphenous vein and left internal mammary artery;  Surgeon: Ivin Poot, MD;  Location: [place];  Service: Open Heart Surgery;  Laterality: N/A;  . ESOPHAGOGASTRODUODENOSCOPY (EGD) WITH PROPOFOL N/A 02/11/2016   Procedure: ESOPHAGOGASTRODUODENOSCOPY (EGD) WITH PROPOFOL;  Surgeon: Ladene Artist, MD;  Location: [facility] ENDOSCOPY;  Service: Endoscopy;  Laterality: N/A;  . EUS N/A 03/09/2016   Procedure: UPPER ENDOSCOPIC ULTRASOUND (EUS) RADIAL;  Surgeon: Milus Banister, MD;  Location[facility] ENDOSCOPY;  Service: Endoscopy;  Laterality: N/A;  . TEE WITHOUT CARDIOVERSION N/A 10/26/2015   Procedure: TRANSESOPHAGEAL ECHOCARDIOGRAM (TEE);  Surgeon: Ivin Poot, MD;  Location: [place];  Service: Open Heart Surgery;  Laterality: N/A;  . TONSILLECTOMY      Current Medications: No outpatient medications have been marked as taking for the 12/07/16 encounter (Appointment) with Josue Hector, MD.     Allergies:   Amoxicillin; Cephalexin; and Penicillin g   Social History   Socioeconomic History  . Marital status: Widowed    Spouse name: Not on file  . Number of children: Not on file  . Years of education: Not on file  .  Highest education level: Not on file  Social Needs  . Financial resource strain: Not on file  . Food insecurity - worry: Not on file  . Food insecurity - inability: Not on file  . Transportation needs - medical: Not on file  . Transportation needs - non-medical: Not on file  Occupational History  . Occupation: retired  Tobacco Use  . Smoking status: Former Smoker    Packs/day: 2.00    Years: 30.00    Pack years: 60.00    Types: Cigarettes    Last attempt to quit: 06/28/2004    Years since quitting: 12.4  . Smokeless tobacco: Never Used  Substance and Sexual Activity  . Alcohol use: No    Comment: QUIT 06/28/2004  . Drug use: No  . Sexual activity: Yes  Other Topics Concern  . Not on file  Social History Narrative  . Not on file     Family History  Problem Relation Age of Onset  . Diabetes Father   . Colon cancer Neg Hx   . Esophageal cancer Neg Hx   . Stomach cancer Neg Hx   . Rectal cancer Neg Hx   . Allergic rhinitis Neg Hx   . Angioedema Neg Hx   . Asthma Neg Hx   . Eczema Neg Hx   . Immunodeficiency Neg Hx   . Urticaria Neg Hx   . Liver disease Neg Hx      ROS:   Please see the history of present illness.    Review of Systems  HENT: Positive for hearing loss.   Cardiovascular: Positive for irregular heartbeat.  Respiratory: Positive for cough and wheezing.    All other systems reviewed and are negative.   EKGs/Labs/Other Test Reviewed:    EKG:  09/22/16   AFib, HR 66  Recent Labs: 09/21/2016: B Natriuretic Peptide 1,875.7 09/23/2016: Magnesium 2.3 10/04/2016: ALT 229; BUN 41; Creatinine, Ser 0.96; Hemoglobin 9.6; Platelets 337; Potassium 3.7; Sodium 142   Recent Lipid Panel    Component Value Date/Time   TRIG 268 (H) 11/05/2015 1325     Physical Exam:    VS:  There were no vitals taken for this visit.    Wt Readings from Last 3 Encounters:  10/05/16 177 lb 11.1 oz (80.6 kg)  09/02/16 196 lb 12.8 oz (89.3 kg)  08/24/16 200 lb (90.7 kg)      Physical Exam  Constitutional: He is oriented to person, place, and time.  He appears well-developed and well-nourished. No distress.  HENT:  Head: Normocephalic and atraumatic.  Eyes: No scleral icterus.  Neck: No JVD present.  Cardiovascular: Normal rate, S1 normal and S2 normal. An irregularly irregular rhythm present.  No murmur heard. Pulmonary/Chest: He has decreased breath sounds. He has rhonchi. He has no rales.  Abdominal: Soft. Bowel sounds are normal. He exhibits no distension.  Musculoskeletal: He exhibits edema.  Neurological: He is alert and oriented to person, place, and time.  Skin: Skin is warm and dry.  Psychiatric: He has a normal mood and affect.    ASSESSMENT:    No diagnosis found. PLAN:    In order of problems listed above:  1. CAD - s/p NSTEMI in the setting of AF with RVR in 10/17 followed by CABG.  He is now off of ASA due to  GI bleed.  He is no longer on nitrates due to hypotension. He denies angina.  Continue beta-blocker, statin.    2. Persistent AF - He remains in AF.  Rate control is fine No coumadin until issues with esophageal cancer resolved   3. Chronic  CHF - Now with combined systolic/diastolic CHF Medical Rx compromised due to co-morbidities and inability to [facility] from afib   4. CKD -   He is due for FU with his nephrologist.  10/04/16 .96   5. HTN - BP is controlled.  Adjust medications as noted.   6. COPD - f/u pulmonary on selective beta blocker CXR 08/18/16 small bilateral effuson and atelectasis 60 pack year smoker quit 2006  PFTls with progressive disease And on Breo  7. Hx of GI bleed - with invasive esophageal cancer no coumadin has f/u at [facility] biopsy did show Adenocarcinoma Not clear to me what next Rx step is Told him to have Dr Fuller Plan discuss with [facility] And come up with a plan No coumadin for now    [employer]

## 2016-12-07 ENCOUNTER — Ambulatory Visit: Payer: Medicare Other | Admitting: Cardiovascular Disease

## 2016-12-12 ENCOUNTER — Encounter: Payer: Self-pay | Admitting: Cardiovascular Disease

## 2016-12-14 ENCOUNTER — Encounter: Payer: Self-pay | Admitting: Cardiovascular Disease

## 2016-12-18 IMAGING — CR DG ANKLE PORT 2V*L*
2 series · 2 of 2 positions shown · non-contrast
Comparison: None.

CLINICAL DATA: LEFT ankle pain

EXAM:
PORTABLE LEFT ANKLE - 2 VIEW

[AP]
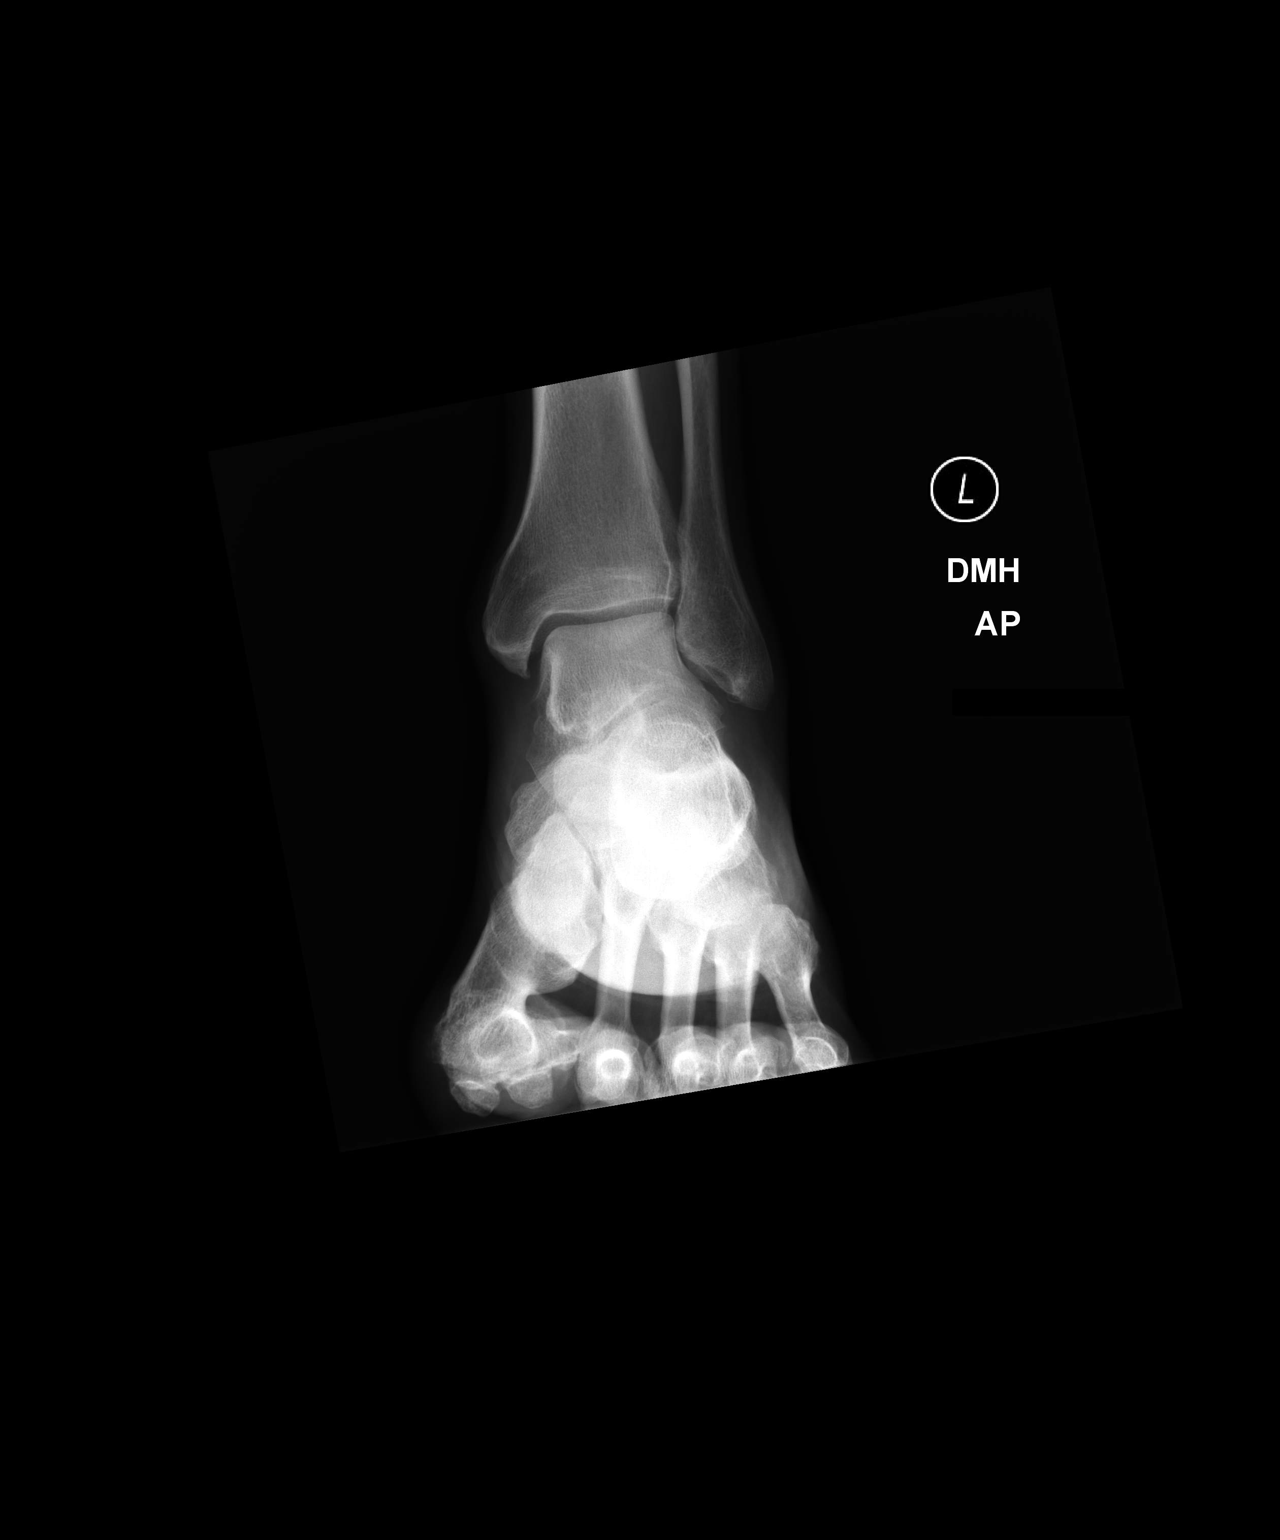

[lateral]
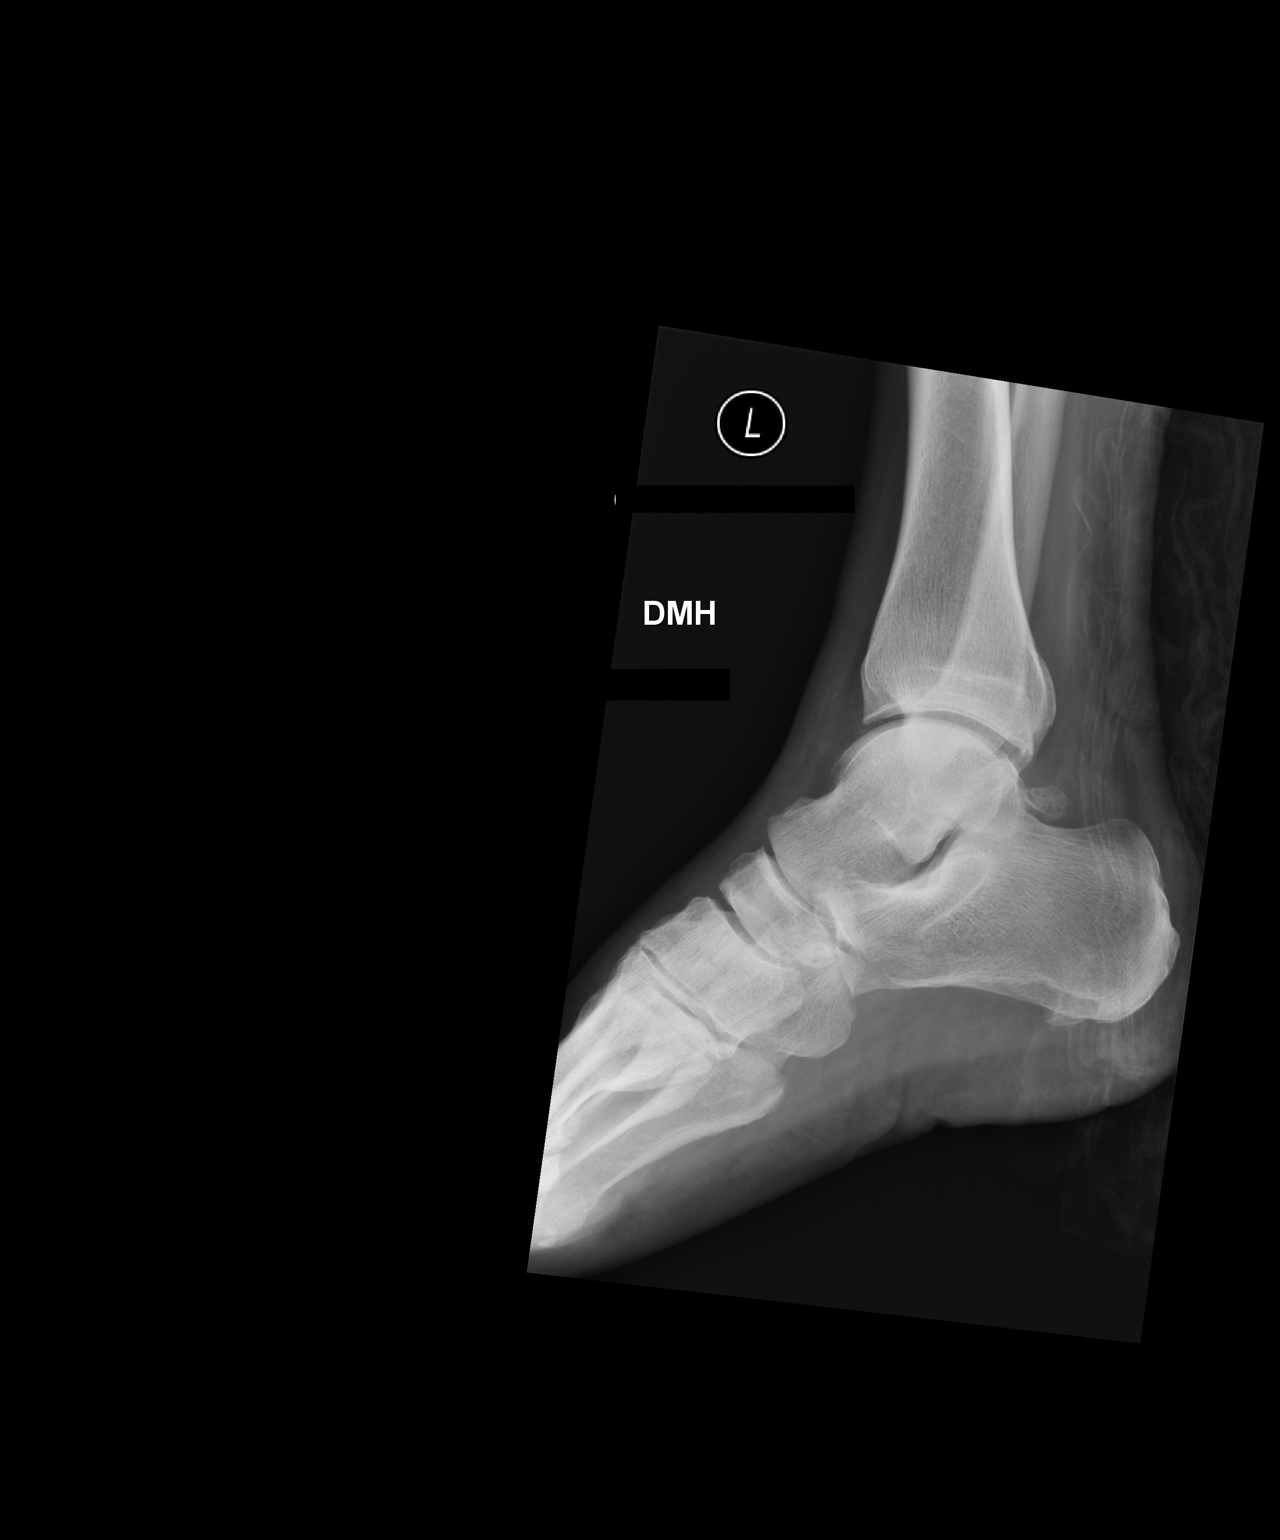

[2 of 2 positions shown; findings below may reference images not displayed]

FINDINGS: Ankle mortise intact. The talar dome is normal. No malleolar
fracture. The calcaneus is normal.
IMPRESSION: No fracture or dislocation.

## 2016-12-21 IMAGING — CR DG CHEST 1V PORT
1 series · 1 of 1 positions shown · non-contrast
Comparison: 11/26/2015

CLINICAL DATA: Elevated white blood cell count

EXAM:
PORTABLE CHEST 1 VIEW

[AP]
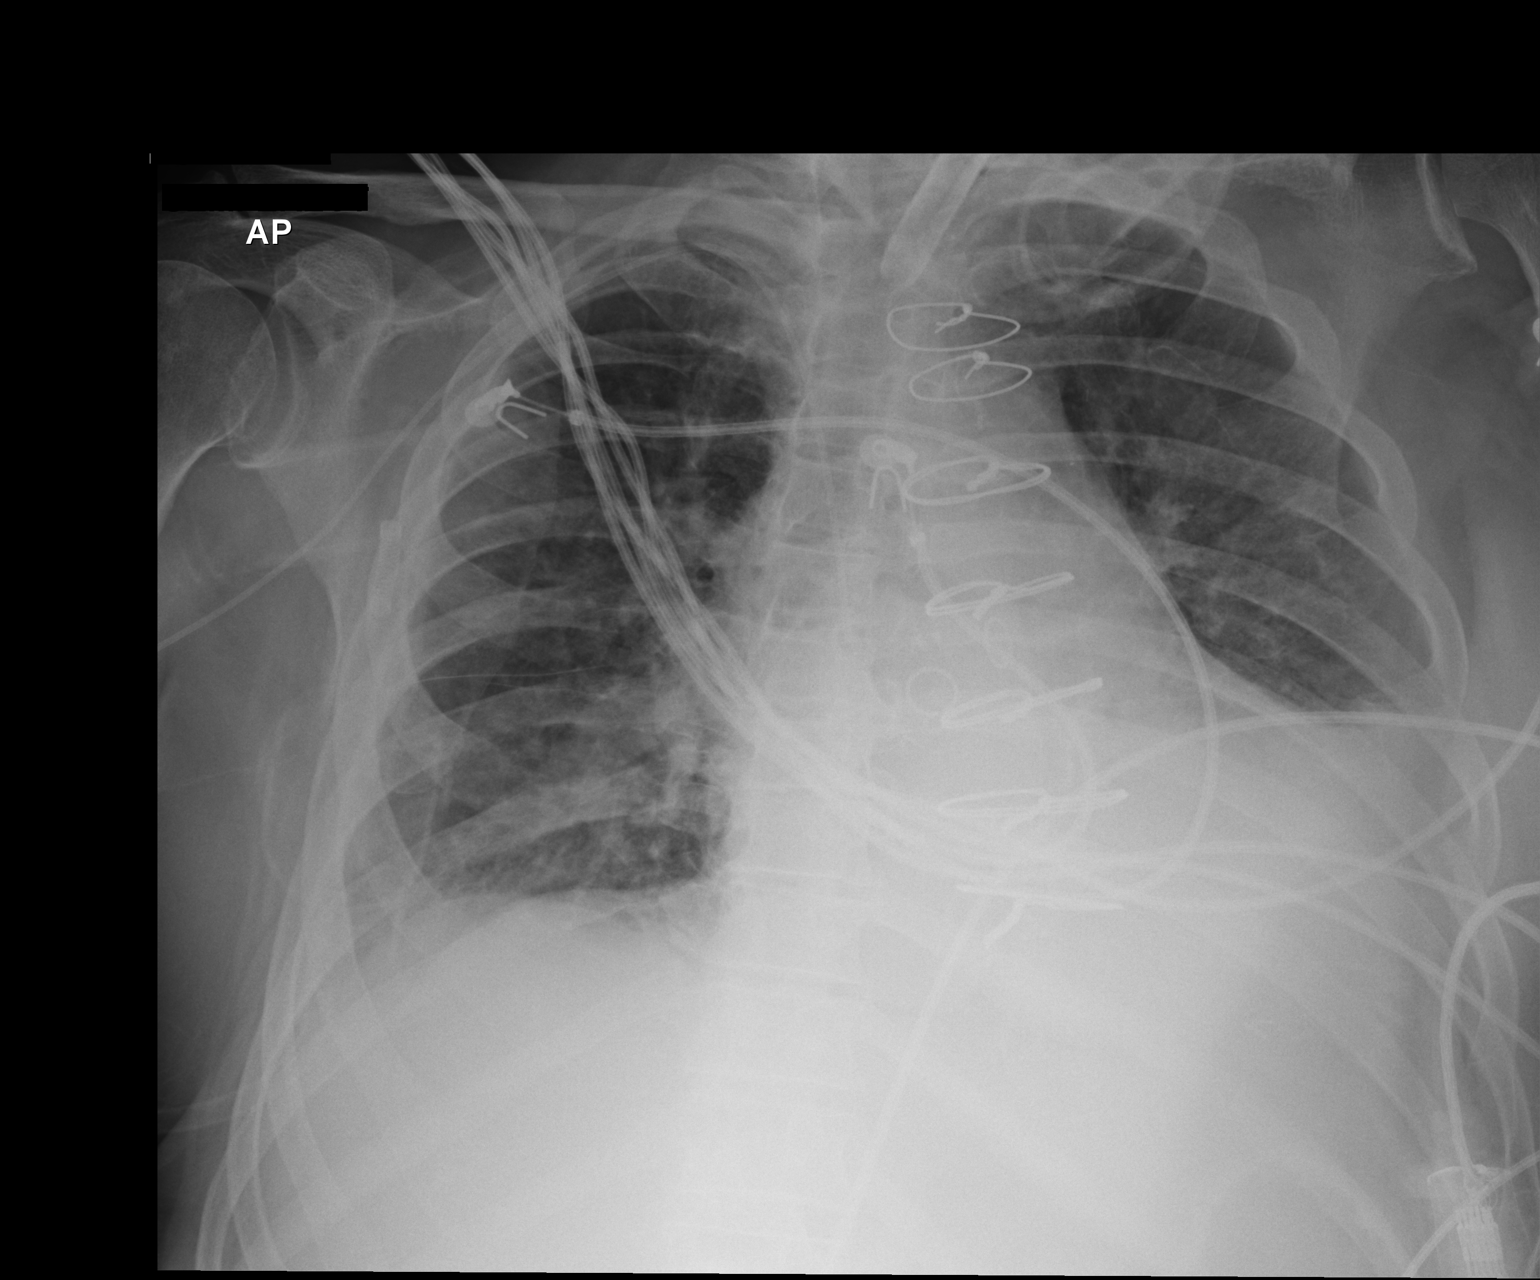

[1 of 1 positions shown; findings below may reference images not displayed]

FINDINGS: Tracheostomy tube is noted in satisfactory position as is a
right-sided PICC line. Cardiac shadow is stable. Postsurgical
changes are again seen. Elevation of left hemidiaphragm is noted
with mild atelectasis and likely small effusion. Similar changes are
noted in the right lung base. No bony abnormality is seen.
IMPRESSION: Mild bibasilar changes similar to that seen on the prior exam.

## 2017-10-18 IMAGING — DX DG CHEST 1V PORT
1 series · 1 of 1 positions shown · non-contrast
Comparison: 09/26/2016

CLINICAL DATA: Central line placement

EXAM:
PORTABLE CHEST 1 VIEW

[chest ap]
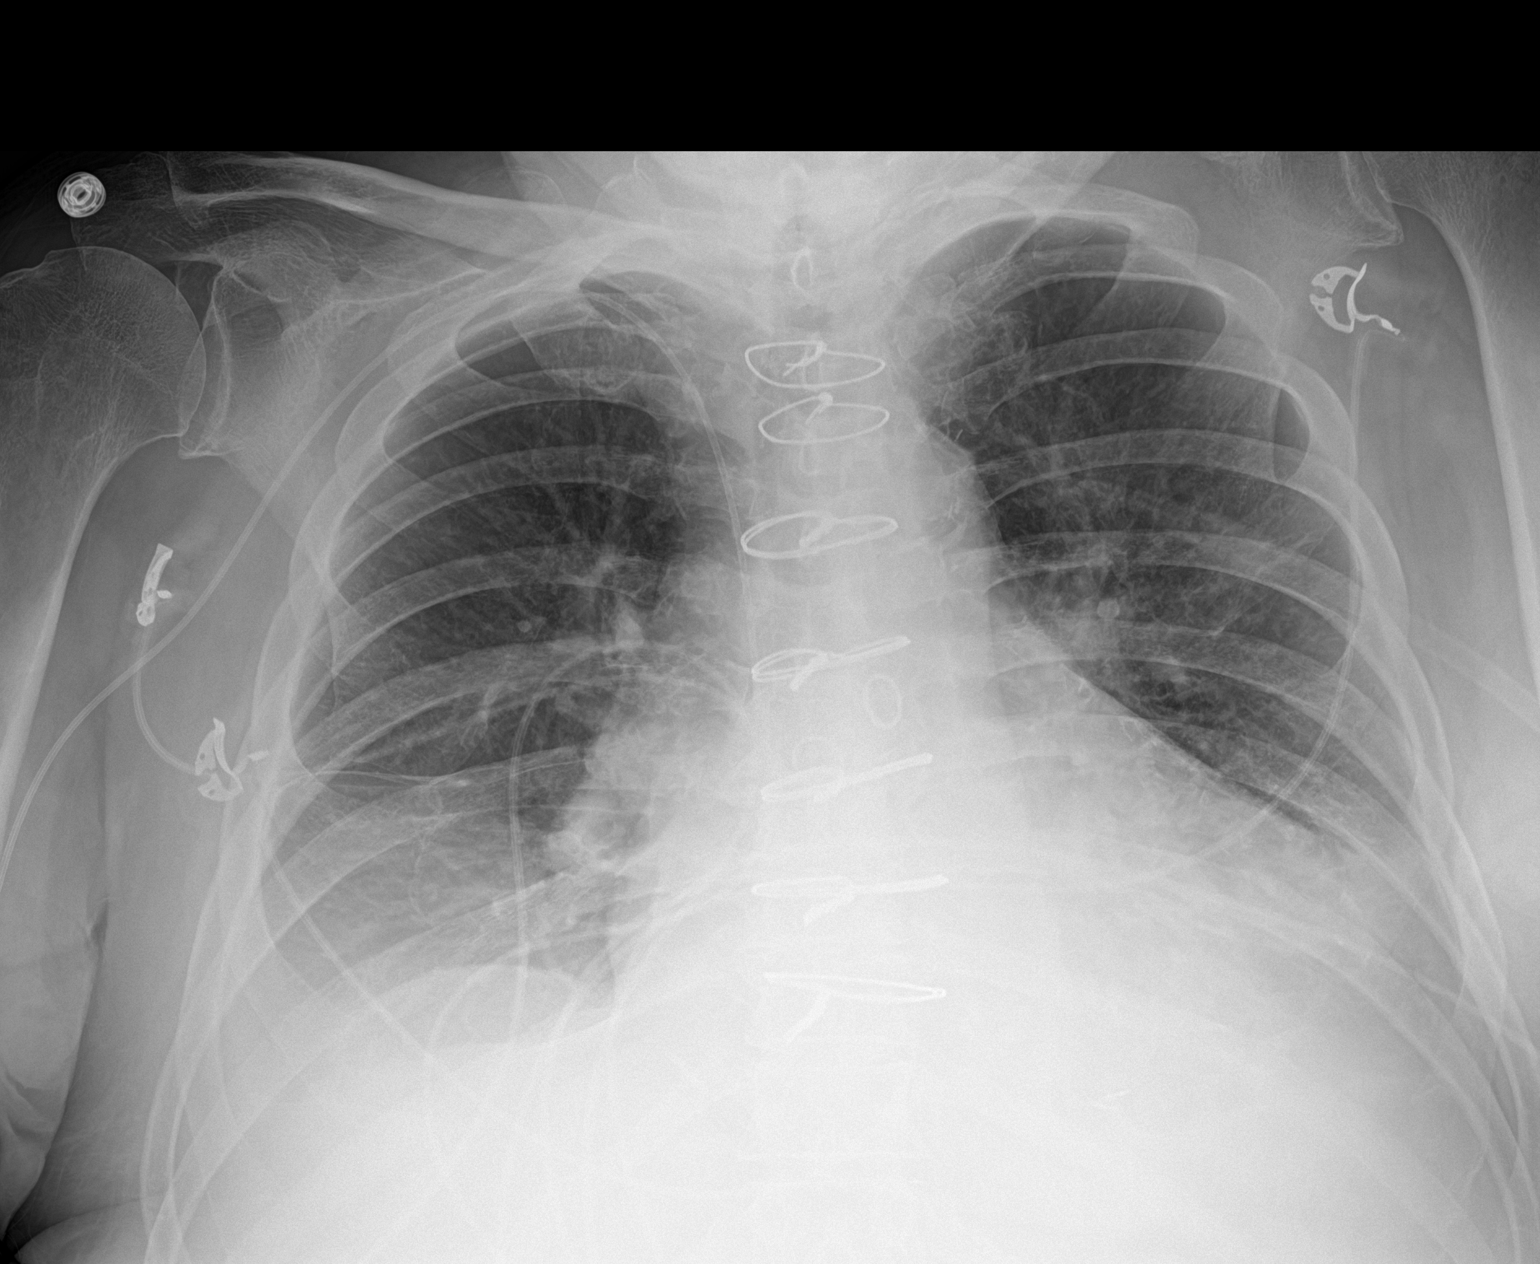

[1 of 1 positions shown; findings below may reference images not displayed]

FINDINGS: Right arm PICC tip has been placed with the tip in the SVC.

Bibasilar airspace disease and bilateral effusions unchanged from
prior exam. Prior CABG
IMPRESSION: Right arm PICC tip in the SVC in good position

Bibasilar atelectasis and effusion unchanged.

## 2017-10-19 IMAGING — DX DG CHEST 1V PORT
1 series · 1 of 1 positions shown · non-contrast
Comparison: 10/01/2016 chest radiograph.

CLINICAL DATA: Cough.  Dyspnea.  Hypoxia.

EXAM:
PORTABLE CHEST 1 VIEW

[chest ap]
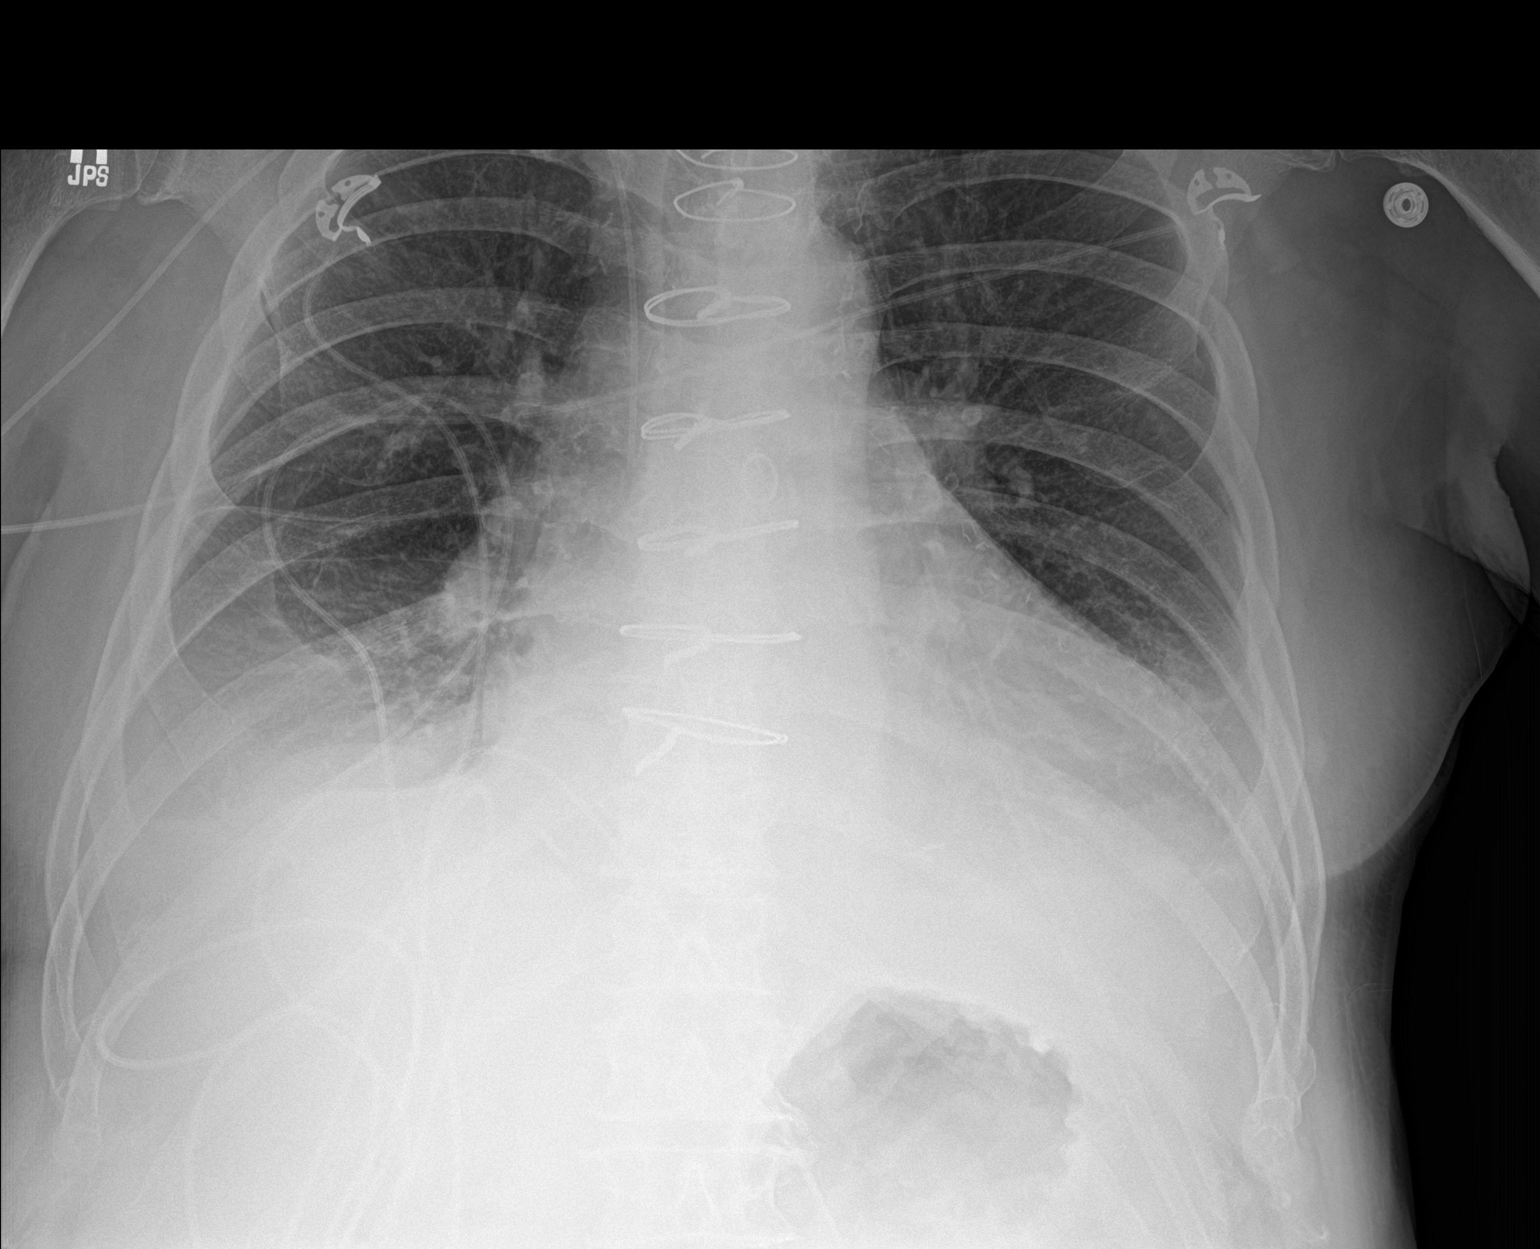

[1 of 1 positions shown; findings below may reference images not displayed]

FINDINGS: Right PICC terminates in the lower third of the superior vena cava.
Intact sternotomy wires. Stable cardiomediastinal silhouette with
mild cardiomegaly. No pneumothorax. Stable small bilateral pleural
effusions and hazy bibasilar lung opacities. No overt pulmonary
edema.
IMPRESSION: 1. Stable mild cardiomegaly without overt pulmonary edema.
2. Stable small bilateral pleural effusions with hazy bibasilar lung
opacities, favor atelectasis.

## 2019-04-10 DEATH — deceased
